# Patient Record
Sex: Male | Born: 1935 | Race: White | Hispanic: No | Smoking: Former smoker
Health system: Southern US, Community
[De-identification: ages and names within clinical notes are randomized; demographics above are authoritative.]

## PROBLEM LIST (undated history)

## (undated) DIAGNOSIS — I2510 Atherosclerotic heart disease of native coronary artery without angina pectoris: Secondary | ICD-10-CM

## (undated) DIAGNOSIS — K219 Gastro-esophageal reflux disease without esophagitis: Secondary | ICD-10-CM

## (undated) DIAGNOSIS — M1990 Unspecified osteoarthritis, unspecified site: Secondary | ICD-10-CM

## (undated) DIAGNOSIS — I10 Essential (primary) hypertension: Secondary | ICD-10-CM

## (undated) DIAGNOSIS — E785 Hyperlipidemia, unspecified: Secondary | ICD-10-CM

## (undated) DIAGNOSIS — M545 Low back pain, unspecified: Secondary | ICD-10-CM

## (undated) DIAGNOSIS — R7302 Impaired glucose tolerance (oral): Secondary | ICD-10-CM

## (undated) DIAGNOSIS — J449 Chronic obstructive pulmonary disease, unspecified: Secondary | ICD-10-CM

## (undated) DIAGNOSIS — Z7901 Long term (current) use of anticoagulants: Secondary | ICD-10-CM

## (undated) DIAGNOSIS — Z952 Presence of prosthetic heart valve: Secondary | ICD-10-CM

## (undated) HISTORY — DX: Chronic obstructive pulmonary disease, unspecified: J44.9

## (undated) HISTORY — DX: Low back pain: M54.5

## (undated) HISTORY — DX: Essential (primary) hypertension: I10

## (undated) HISTORY — DX: Long term (current) use of anticoagulants: Z79.01

## (undated) HISTORY — DX: Atherosclerotic heart disease of native coronary artery without angina pectoris: I25.10

## (undated) HISTORY — DX: Impaired glucose tolerance (oral): R73.02

## (undated) HISTORY — DX: Hyperlipidemia, unspecified: E78.5

## (undated) HISTORY — DX: Unspecified osteoarthritis, unspecified site: M19.90

## (undated) HISTORY — DX: Gastro-esophageal reflux disease without esophagitis: K21.9

## (undated) HISTORY — DX: Low back pain, unspecified: M54.50

## (undated) HISTORY — DX: Presence of prosthetic heart valve: Z95.2

---

## 1998-10-02 ENCOUNTER — Inpatient Hospital Stay (HOSPITAL_COMMUNITY): Admission: EM | Admit: 1998-10-02 | Discharge: 1998-10-15 | Payer: Self-pay | Admitting: Emergency Medicine

## 1998-10-02 ENCOUNTER — Encounter: Payer: Self-pay | Admitting: Emergency Medicine

## 1998-10-05 HISTORY — PX: CARDIAC CATHETERIZATION: SHX172

## 1998-10-07 ENCOUNTER — Encounter: Payer: Self-pay | Admitting: Surgery

## 1998-10-08 ENCOUNTER — Encounter: Payer: Self-pay | Admitting: Surgery

## 1998-10-09 ENCOUNTER — Encounter: Payer: Self-pay | Admitting: Cardiovascular Disease

## 1998-10-09 HISTORY — PX: CORONARY ARTERY BYPASS GRAFT: SHX141

## 1998-10-10 ENCOUNTER — Encounter: Payer: Self-pay | Admitting: Cardiovascular Disease

## 1998-10-11 ENCOUNTER — Encounter: Payer: Self-pay | Admitting: Surgery

## 1998-10-12 ENCOUNTER — Encounter: Payer: Self-pay | Admitting: Cardiothoracic Surgery

## 1999-01-15 HISTORY — PX: AORTIC VALVE REPLACEMENT: SHX41

## 1999-06-10 ENCOUNTER — Encounter: Payer: Self-pay | Admitting: Internal Medicine

## 1999-06-10 ENCOUNTER — Inpatient Hospital Stay (HOSPITAL_COMMUNITY): Admission: EM | Admit: 1999-06-10 | Discharge: 1999-06-12 | Payer: Self-pay | Admitting: Emergency Medicine

## 1999-06-11 HISTORY — PX: CARDIAC CATHETERIZATION: SHX172

## 1999-06-21 ENCOUNTER — Encounter: Payer: Self-pay | Admitting: Emergency Medicine

## 1999-06-21 ENCOUNTER — Inpatient Hospital Stay (HOSPITAL_COMMUNITY): Admission: EM | Admit: 1999-06-21 | Discharge: 1999-06-22 | Payer: Self-pay | Admitting: Emergency Medicine

## 1999-06-22 ENCOUNTER — Ambulatory Visit (HOSPITAL_COMMUNITY): Admission: RE | Admit: 1999-06-22 | Discharge: 1999-06-22 | Payer: Self-pay | Admitting: *Deleted

## 1999-12-13 ENCOUNTER — Encounter: Payer: Self-pay | Admitting: Specialist

## 1999-12-13 ENCOUNTER — Ambulatory Visit (HOSPITAL_COMMUNITY): Admission: RE | Admit: 1999-12-13 | Discharge: 1999-12-13 | Payer: Self-pay | Admitting: Specialist

## 2001-03-13 ENCOUNTER — Encounter: Payer: Self-pay | Admitting: Emergency Medicine

## 2001-03-13 ENCOUNTER — Emergency Department (HOSPITAL_COMMUNITY): Admission: EM | Admit: 2001-03-13 | Discharge: 2001-03-13 | Payer: Self-pay | Admitting: Emergency Medicine

## 2001-08-03 ENCOUNTER — Encounter: Payer: Self-pay | Admitting: Emergency Medicine

## 2001-08-03 ENCOUNTER — Inpatient Hospital Stay (HOSPITAL_COMMUNITY): Admission: EM | Admit: 2001-08-03 | Discharge: 2001-08-05 | Payer: Self-pay | Admitting: Emergency Medicine

## 2001-08-03 ENCOUNTER — Encounter: Payer: Self-pay | Admitting: Internal Medicine

## 2001-08-04 ENCOUNTER — Encounter: Payer: Self-pay | Admitting: Internal Medicine

## 2003-08-30 ENCOUNTER — Encounter: Admission: RE | Admit: 2003-08-30 | Discharge: 2003-08-30 | Payer: Self-pay | Admitting: Internal Medicine

## 2003-10-10 ENCOUNTER — Encounter: Admission: RE | Admit: 2003-10-10 | Discharge: 2003-10-10 | Payer: Self-pay | Admitting: Neurology

## 2004-03-29 ENCOUNTER — Ambulatory Visit: Payer: Self-pay | Admitting: Internal Medicine

## 2004-03-31 ENCOUNTER — Ambulatory Visit: Payer: Self-pay | Admitting: Internal Medicine

## 2004-04-28 ENCOUNTER — Ambulatory Visit: Payer: Self-pay | Admitting: Internal Medicine

## 2004-08-06 ENCOUNTER — Ambulatory Visit: Payer: Self-pay | Admitting: Internal Medicine

## 2005-03-21 ENCOUNTER — Ambulatory Visit: Payer: Self-pay | Admitting: Internal Medicine

## 2007-01-05 ENCOUNTER — Ambulatory Visit: Payer: Self-pay | Admitting: Internal Medicine

## 2007-01-06 ENCOUNTER — Inpatient Hospital Stay (HOSPITAL_COMMUNITY): Admission: EM | Admit: 2007-01-06 | Discharge: 2007-01-09 | Payer: Self-pay | Admitting: Emergency Medicine

## 2007-01-08 HISTORY — PX: CARDIAC CATHETERIZATION: SHX172

## 2007-01-29 ENCOUNTER — Encounter: Payer: Self-pay | Admitting: Internal Medicine

## 2007-01-29 ENCOUNTER — Ambulatory Visit: Payer: Self-pay | Admitting: Internal Medicine

## 2007-01-29 DIAGNOSIS — R05 Cough: Secondary | ICD-10-CM

## 2007-01-29 DIAGNOSIS — R21 Rash and other nonspecific skin eruption: Secondary | ICD-10-CM | POA: Insufficient documentation

## 2007-02-08 ENCOUNTER — Encounter: Payer: Self-pay | Admitting: Internal Medicine

## 2007-02-08 DIAGNOSIS — M1990 Unspecified osteoarthritis, unspecified site: Secondary | ICD-10-CM | POA: Insufficient documentation

## 2007-02-08 DIAGNOSIS — R7309 Other abnormal glucose: Secondary | ICD-10-CM

## 2007-02-08 DIAGNOSIS — M545 Low back pain: Secondary | ICD-10-CM | POA: Insufficient documentation

## 2007-02-08 DIAGNOSIS — J449 Chronic obstructive pulmonary disease, unspecified: Secondary | ICD-10-CM | POA: Insufficient documentation

## 2007-02-08 DIAGNOSIS — I10 Essential (primary) hypertension: Secondary | ICD-10-CM

## 2007-02-08 DIAGNOSIS — K219 Gastro-esophageal reflux disease without esophagitis: Secondary | ICD-10-CM | POA: Insufficient documentation

## 2007-02-08 DIAGNOSIS — I2510 Atherosclerotic heart disease of native coronary artery without angina pectoris: Secondary | ICD-10-CM | POA: Insufficient documentation

## 2007-02-08 DIAGNOSIS — E785 Hyperlipidemia, unspecified: Secondary | ICD-10-CM

## 2007-04-03 ENCOUNTER — Ambulatory Visit: Payer: Self-pay | Admitting: Internal Medicine

## 2007-04-03 DIAGNOSIS — L570 Actinic keratosis: Secondary | ICD-10-CM | POA: Insufficient documentation

## 2007-05-08 ENCOUNTER — Ambulatory Visit: Payer: Self-pay | Admitting: Internal Medicine

## 2007-05-08 DIAGNOSIS — D485 Neoplasm of uncertain behavior of skin: Secondary | ICD-10-CM | POA: Insufficient documentation

## 2007-05-17 DIAGNOSIS — Z952 Presence of prosthetic heart valve: Secondary | ICD-10-CM

## 2007-05-17 HISTORY — DX: Presence of prosthetic heart valve: Z95.2

## 2007-08-01 ENCOUNTER — Ambulatory Visit: Payer: Self-pay | Admitting: Internal Medicine

## 2007-08-05 LAB — CONVERTED CEMR LAB
BUN: 13 mg/dL (ref 6–23)
Basophils Absolute: 0 10*3/uL (ref 0.0–0.1)
Calcium: 9.3 mg/dL (ref 8.4–10.5)
Chloride: 107 meq/L (ref 96–112)
Cholesterol: 205 mg/dL (ref 0–200)
Direct LDL: 136.2 mg/dL
Eosinophils Absolute: 0.2 10*3/uL (ref 0.0–0.6)
GFR calc Af Amer: 83 mL/min
GFR calc non Af Amer: 68 mL/min
HCT: 47.8 % (ref 39.0–52.0)
Hemoglobin: 15.8 g/dL (ref 13.0–17.0)
Lymphocytes Relative: 38.4 % (ref 12.0–46.0)
Monocytes Relative: 8.6 % (ref 3.0–11.0)
Neutro Abs: 3.1 10*3/uL (ref 1.4–7.7)
Platelets: 111 10*3/uL — ABNORMAL LOW (ref 150–400)
Potassium: 4.2 meq/L (ref 3.5–5.1)
RBC: 5.17 M/uL (ref 4.22–5.81)
Sodium: 142 meq/L (ref 135–145)
TSH: 3.47 microintl units/mL (ref 0.35–5.50)
VLDL: 18 mg/dL (ref 0–40)
WBC: 6.4 10*3/uL (ref 4.5–10.5)

## 2007-08-07 ENCOUNTER — Ambulatory Visit: Payer: Self-pay | Admitting: Internal Medicine

## 2007-11-14 ENCOUNTER — Ambulatory Visit: Payer: Self-pay | Admitting: Internal Medicine

## 2007-11-15 LAB — CONVERTED CEMR LAB
CO2: 30 meq/L (ref 19–32)
Calcium: 9.3 mg/dL (ref 8.4–10.5)
Chloride: 108 meq/L (ref 96–112)
GFR calc Af Amer: 75 mL/min
GFR calc non Af Amer: 62 mL/min
Hgb A1c MFr Bld: 5.9 % (ref 4.6–6.0)
Potassium: 4.4 meq/L (ref 3.5–5.1)
Sodium: 143 meq/L (ref 135–145)

## 2007-12-17 ENCOUNTER — Encounter: Payer: Self-pay | Admitting: Internal Medicine

## 2008-03-07 IMAGING — CR DG THORACIC SPINE 2V
3 series · 3 of 3 positions shown · non-contrast
Comparison: none

CLINICAL DATA: Lower thoracic spine pain radiating to right thigh.
 THORACIC SPINE ? 2 VIEW:

[view not recorded (1 of 3)]
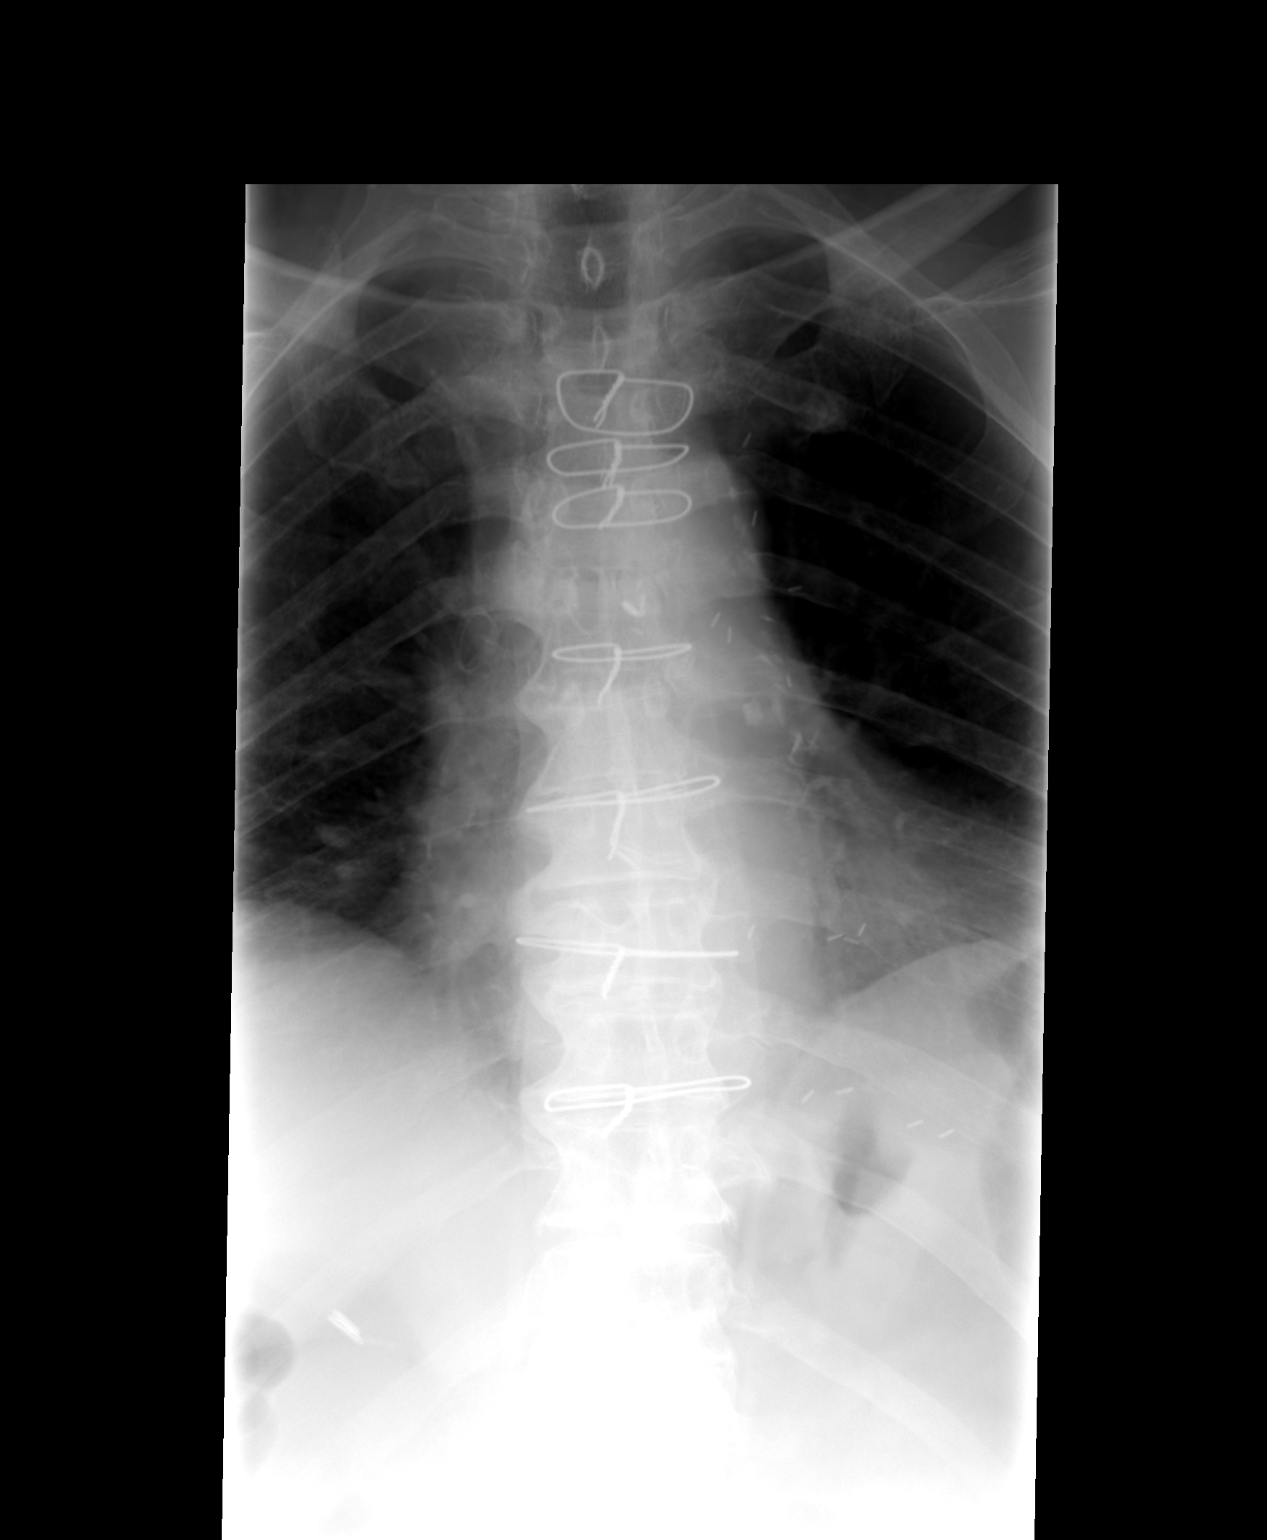

[view not recorded (2 of 3)]
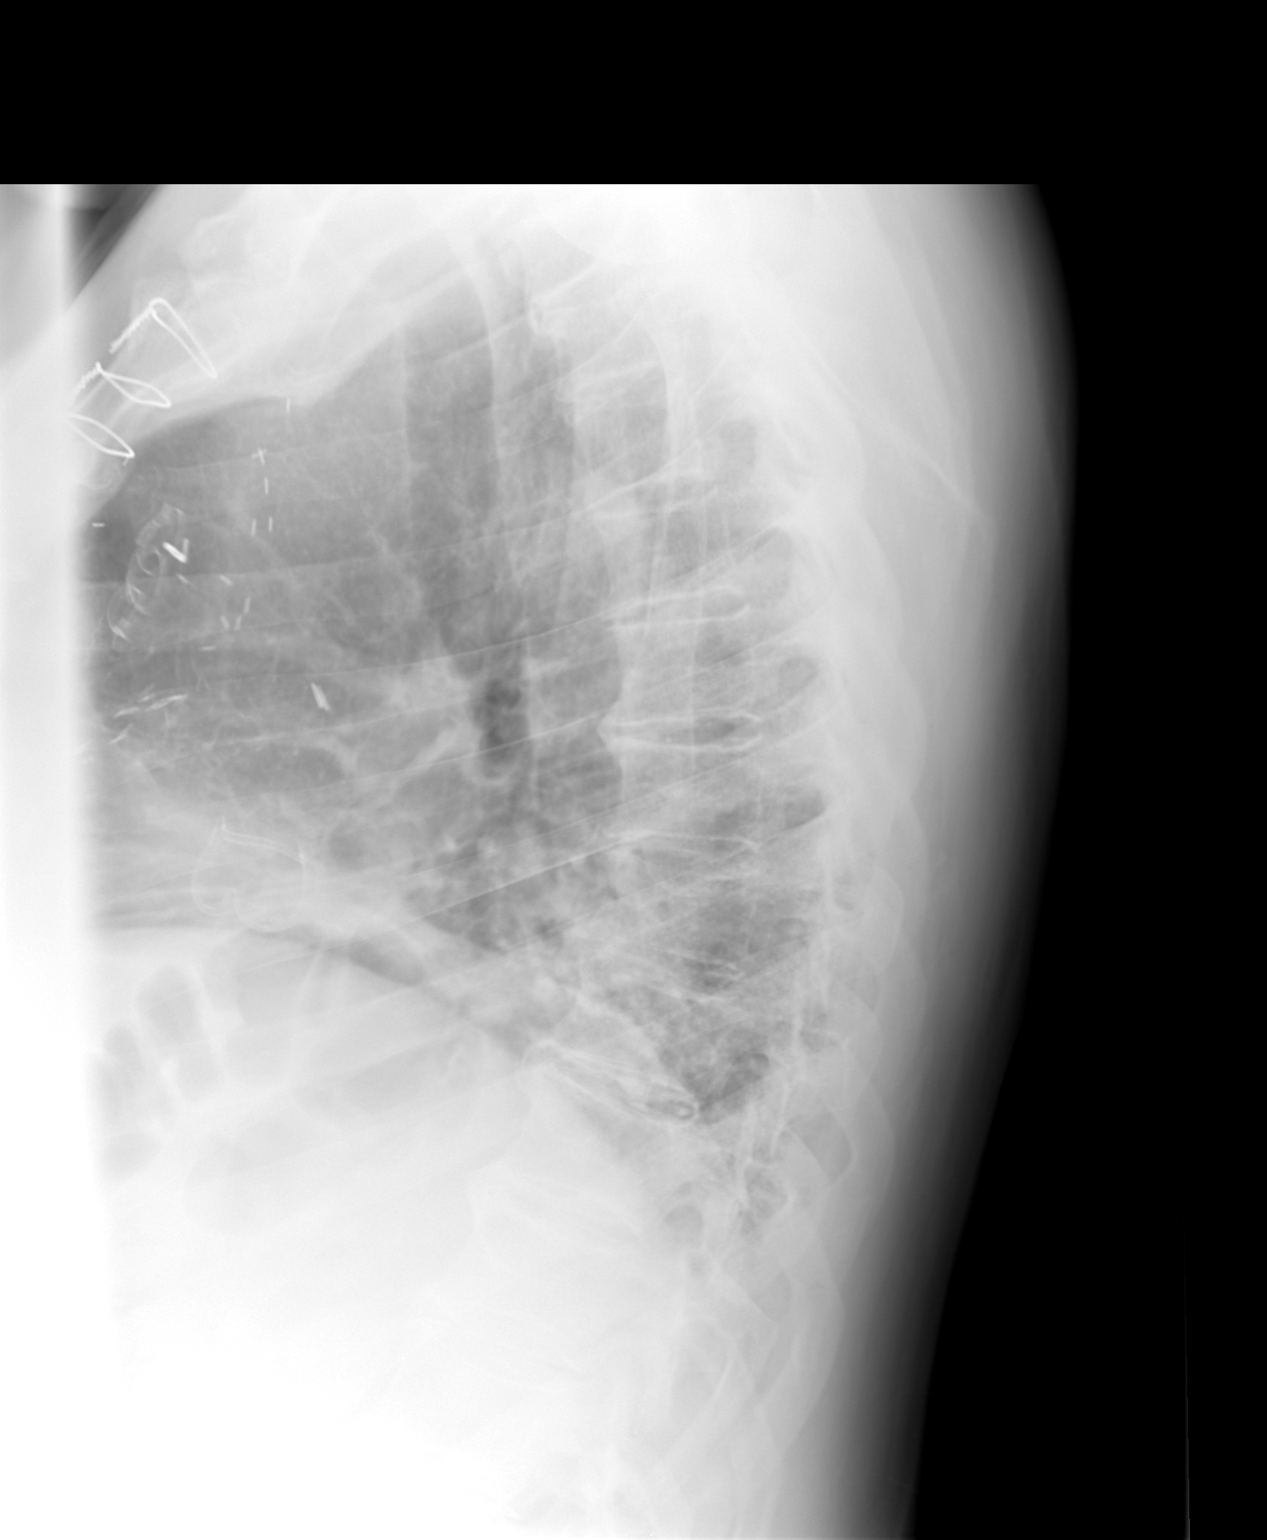

[view not recorded (3 of 3)]
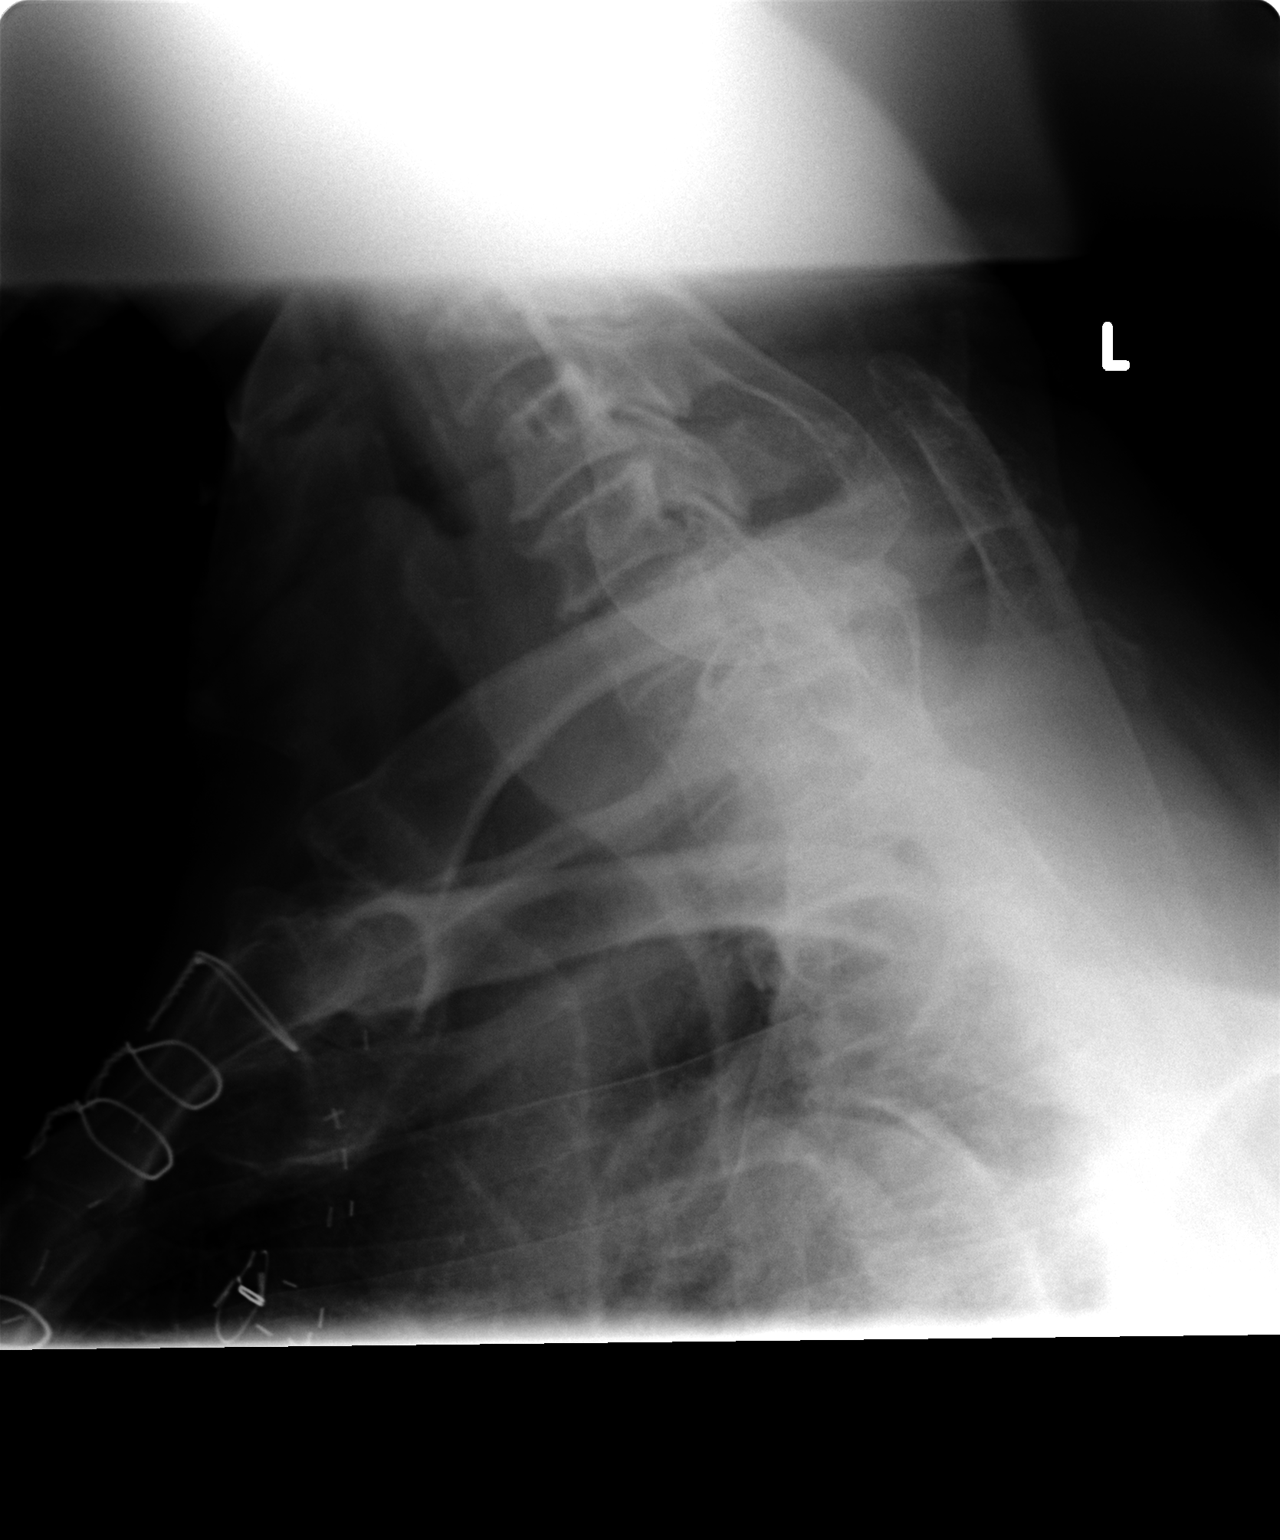

[3 of 3 positions shown; findings below may reference images not displayed]

FINDINGS: Alignment is anatomic.  There are endplate degenerative changes throughout.  Vertebral body height maintained.
IMPRESSION: Spondylosis.

## 2008-11-25 ENCOUNTER — Encounter: Payer: Self-pay | Admitting: Internal Medicine

## 2008-11-25 ENCOUNTER — Ambulatory Visit: Payer: Self-pay | Admitting: Internal Medicine

## 2009-02-11 ENCOUNTER — Ambulatory Visit: Payer: Self-pay | Admitting: Internal Medicine

## 2009-02-13 LAB — CONVERTED CEMR LAB
ALT: 18 units/L (ref 0–53)
AST: 23 units/L (ref 0–37)
Albumin: 4 g/dL (ref 3.5–5.2)
Alkaline Phosphatase: 42 units/L (ref 39–117)
BUN: 17 mg/dL (ref 6–23)
Basophils Relative: 0.4 % (ref 0.0–3.0)
Bilirubin, Direct: 0.2 mg/dL (ref 0.0–0.3)
Calcium: 9.3 mg/dL (ref 8.4–10.5)
Cholesterol: 170 mg/dL (ref 0–200)
Creatinine, Ser: 1.2 mg/dL (ref 0.4–1.5)
Eosinophils Absolute: 0.2 10*3/uL (ref 0.0–0.7)
GFR calc non Af Amer: 61.38 mL/min (ref >60)
Glucose, Bld: 124 mg/dL — ABNORMAL HIGH (ref 70–99)
Hemoglobin, Urine: NEGATIVE
LDL Cholesterol: 106 mg/dL — ABNORMAL HIGH (ref 0–99)
Lymphocytes Relative: 35 % (ref 12.0–46.0)
Lymphs Abs: 2.5 10*3/uL (ref 0.7–4.0)
Neutro Abs: 3.8 10*3/uL (ref 1.4–7.7)
Platelets: 98 10*3/uL — ABNORMAL LOW (ref 150.0–400.0)
RDW: 13 % (ref 11.5–14.6)
Specific Gravity, Urine: >=1.03 (ref 1.000–1.030)
Total CHOL/HDL Ratio: 4
Total Protein: 7.1 g/dL (ref 6.0–8.3)
VLDL: 20.6 mg/dL (ref 0.0–40.0)

## 2009-02-27 ENCOUNTER — Ambulatory Visit: Payer: Self-pay | Admitting: Internal Medicine

## 2009-02-27 DIAGNOSIS — Z87891 Personal history of nicotine dependence: Secondary | ICD-10-CM

## 2009-03-05 ENCOUNTER — Telehealth: Payer: Self-pay | Admitting: Internal Medicine

## 2009-03-05 DIAGNOSIS — C439 Malignant melanoma of skin, unspecified: Secondary | ICD-10-CM | POA: Insufficient documentation

## 2009-03-26 ENCOUNTER — Encounter: Payer: Self-pay | Admitting: Internal Medicine

## 2009-08-21 ENCOUNTER — Ambulatory Visit: Payer: Self-pay | Admitting: Internal Medicine

## 2009-11-04 ENCOUNTER — Telehealth (INDEPENDENT_AMBULATORY_CARE_PROVIDER_SITE_OTHER): Payer: Self-pay | Admitting: *Deleted

## 2009-11-13 ENCOUNTER — Ambulatory Visit: Payer: Self-pay | Admitting: Internal Medicine

## 2009-11-13 LAB — CONVERTED CEMR LAB
ALT: 17 units/L (ref 0–53)
BUN: 20 mg/dL (ref 6–23)
Basophils Absolute: 0 10*3/uL (ref 0.0–0.1)
Bilirubin Urine: NEGATIVE
Bilirubin, Direct: 0.2 mg/dL (ref 0.0–0.3)
Chloride: 109 meq/L (ref 96–112)
Eosinophils Relative: 2.9 % (ref 0.0–5.0)
HDL: 47.3 mg/dL (ref >39.00)
Ketones, ur: NEGATIVE mg/dL
Leukocytes, UA: NEGATIVE
MCV: 92.3 fL (ref 78.0–100.0)
Monocytes Absolute: 0.8 10*3/uL (ref 0.1–1.0)
Neutrophils Relative %: 49.2 % (ref 43.0–77.0)
Nitrite: NEGATIVE
Platelets: 108 10*3/uL — ABNORMAL LOW (ref 150.0–400.0)
RBC: 4.96 M/uL (ref 4.22–5.81)
RDW: 13.9 % (ref 11.5–14.6)
Specific Gravity, Urine: >=1.03 (ref 1.000–1.030)
Total Bilirubin: 0.7 mg/dL (ref 0.3–1.2)
Total Protein, Urine: NEGATIVE mg/dL
Triglycerides: 90 mg/dL (ref 0.0–149.0)
WBC: 7.4 10*3/uL (ref 4.5–10.5)
pH: 5.5 (ref 5.0–8.0)

## 2009-11-20 ENCOUNTER — Telehealth: Payer: Self-pay | Admitting: Internal Medicine

## 2009-11-20 ENCOUNTER — Ambulatory Visit: Payer: Self-pay | Admitting: Internal Medicine

## 2010-01-15 ENCOUNTER — Ambulatory Visit: Payer: Self-pay | Admitting: Internal Medicine

## 2010-01-15 LAB — CONVERTED CEMR LAB
BUN: 17 mg/dL (ref 6–23)
Basophils Relative: 0.3 % (ref 0.0–3.0)
CO2: 27 meq/L (ref 19–32)
Calcium: 8.9 mg/dL (ref 8.4–10.5)
Creatinine, Ser: 1 mg/dL (ref 0.4–1.5)
Eosinophils Relative: 1.5 % (ref 0.0–5.0)
GFR calc non Af Amer: 73.05 mL/min (ref >60)
Glucose, Bld: 92 mg/dL (ref 70–99)
Lymphocytes Relative: 41.4 % (ref 12.0–46.0)
MCHC: 34.6 g/dL (ref 30.0–36.0)
Monocytes Relative: 9.6 % (ref 3.0–12.0)
Neutrophils Relative %: 47.2 % (ref 43.0–77.0)
Platelets: 101 10*3/uL — ABNORMAL LOW (ref 150.0–400.0)
Sodium: 138 meq/L (ref 135–145)

## 2010-01-22 ENCOUNTER — Ambulatory Visit: Payer: Self-pay | Admitting: Internal Medicine

## 2010-01-22 DIAGNOSIS — D696 Thrombocytopenia, unspecified: Secondary | ICD-10-CM | POA: Insufficient documentation

## 2010-04-22 HISTORY — PX: OTHER SURGICAL HISTORY: SHX169

## 2010-04-22 HISTORY — PX: CARDIOVASCULAR STRESS TEST: SHX262

## 2010-05-21 ENCOUNTER — Ambulatory Visit: Admit: 2010-05-21 | Payer: Self-pay | Admitting: Internal Medicine

## 2010-05-28 ENCOUNTER — Ambulatory Visit: Admit: 2010-05-28 | Payer: Self-pay | Admitting: Internal Medicine

## 2010-06-15 NOTE — Assessment & Plan Note (Signed)
Summary: 3 mos f/u    Vital Signs:  Patient profile:   77 year old male Height:      70 inches (177.80 cm) Weight:      217 pounds (98.64 kg) BMI:     31.25 O2 Sat:      95 % on Room air Temp:     97.6 degrees F (36.44 degrees C) oral Pulse rate:   89 / minute Pulse rhythm:   regular Resp:     16 per minute BP sitting:   128 / 76  (left arm) Cuff size:   regular  Vitals Entered By: Lanier Prude, CMA(AAMA) (November 20, 2009 2:05 PM)  O2 Flow:  Room air CC: 3 mo f/u Is Patient Diabetic? No   CC:  3 mo f/u.  History of Present Illness: The patient presents for a follow up of hypertension, diabetes, hyperlipidemia C/o LBP and down L leg  Preventive Screening-Counseling & Management  Alcohol-Tobacco     Smoking Status: quit  Current Medications (verified): 1)  Atenolol 25 Mg Tabs (Atenolol) .Marland Kitchen.. 1 By Mouth Qd 2)  Lisinopril 5 Mg Tabs (Lisinopril) .... 1/2 Qd 3)  Aspir-Low 81 Mg Tbec (Aspirin) .... Qd 4)  Zetia 10 Mg  Tabs (Ezetimibe) .Marland Kitchen.. 1 By Mouth Qd 5)  Triamc. 0.5% Cream in Eucerin Lotion 1:10 .... Use Two Times A Day As Needed  Allergies (verified): 1)  ! Lipitor 2)  ! Welchol (Colesevelam Hcl)  Past History:  Past Medical History: Last updated: 08/07/2007 Anticoagulation therapy Hyperlipidemia Hypertension Osteoarthritis COPD GERD Low back pain AVR 2009 bovine valve Coronary artery disease - intol. of Statins  Social History: Last updated: 04/03/2007 Retired Married  Physical Exam  General:  NAD Mouth:  Oral mucosa and oropharynx without lesions or exudates.  Teeth in good repair. Lungs:  Normal respiratory effort, chest expands symmetrically. Lungs are clear to auscultation, no crackles or wheezes. Heart:  RRR Abdomen:  Bowel sounds positive,abdomen soft and non-tender without masses, organomegaly or hernias noted. Msk:  No deformity or scoliosis noted of thoracic or lumbar spine.   Neurologic:  No cranial nerve deficits noted. Station and  gait are normal. Plantar reflexes are down-going bilaterally. DTRs are symmetrical throughout. Sensory, motor and coordinative functions appear intact. Skin:  scar on ear is healed Psych:  Cognition and judgment appear intact. Alert and cooperative with normal attention span and concentration. No apparent delusions, illusions, hallucinations   Impression & Recommendations:  Problem # 1:  CORONARY ARTERY DISEASE (ICD-414.00) Assessment Unchanged  His updated medication list for this problem includes:    Atenolol 25 Mg Tabs (Atenolol) .Marland Kitchen... 1 by mouth qd    Lisinopril 5 Mg Tabs (Lisinopril) .Marland Kitchen... 1/2 qd    Aspir-low 81 Mg Tbec (Aspirin) ..... Qd  Problem # 2:  HYPERLIPIDEMIA (ICD-272.4) Assessment: Improved  His updated medication list for this problem includes:    Zetia 10 Mg Tabs (Ezetimibe) .Marland Kitchen... 1 by mouth qd  Problem # 3:  LOW BACK PAIN (ICD-724.2) Assessment: Comment Only  His updated medication list for this problem includes:    Aspir-low 81 Mg Tbec (Aspirin) ..... Qd  Problem # 4:  COPD (ICD-496) Assessment: Comment Only  Problem # 5:  OSTEOARTHRITIS (ICD-715.90) Assessment: Comment Only  His updated medication list for this problem includes:    Aspir-low 81 Mg Tbec (Aspirin) ..... Qd  Complete Medication List: 1)  Atenolol 25 Mg Tabs (Atenolol) .Marland Kitchen.. 1 by mouth qd 2)  Lisinopril 5 Mg Tabs (Lisinopril) .Marland KitchenMarland KitchenMarland Kitchen  1/2 qd 3)  Aspir-low 81 Mg Tbec (Aspirin) .... Qd 4)  Zetia 10 Mg Tabs (Ezetimibe) .Marland Kitchen.. 1 by mouth qd 5)  Triamc. 0.5% Cream in Eucerin Lotion 1:10  .... Use two times a day as needed 6)  Prednisone 10 Mg Tabs (Prednisone) .... Take 40mg  qd for 3 days, then 20 mg qd for 3 days, then 10mg  qd for 6 days, then stop. take pc.  Patient Instructions: 1)  Please schedule a follow-up appointment in 2 months. 2)  BMP prior to visit, ICD-9: 3)  CBC w/ Diff prior to visit, ICD-9:995.20 4)  Use stretching and balance exercises that I have provided (15 min. or longer every  day) Prescriptions: PREDNISONE 10 MG TABS (PREDNISONE) Take 40mg  qd for 3 days, then 20 mg qd for 3 days, then 10mg  qd for 6 days, then stop. Take pc.  #24 x 1   Entered and Authorized by:   Tresa Garter MD   Signed by:   Tresa Garter MD on 11/20/2009   Method used:   Print then Give to Patient   RxID:   [id number]

## 2010-06-15 NOTE — Progress Notes (Signed)
Summary: APT TODAY  Phone Note Call from Patient Call back at [phone] ext [phone]   Summary of Call: Pt's daughter called to pass on message to Dr Posey [name]. Pt c/o increase in back pain w/leg tingling.  Also increased stress dealing with wife. Daugther does not think that pt will tell MD and wanted to infom him of her concerns.  Initial call taken by: Lamar Sprinkles, CMA,  November 20, 2009 10:17 AM  Follow-up for Phone Call        Noted - addressed Follow-up by: Tresa Garter MD,  November 20, 2009 5:56 PM

## 2010-06-15 NOTE — Assessment & Plan Note (Signed)
Summary: FU / NWS   Vital Signs:  Patient profile:   77 year old male Height:      70 inches Weight:      216 pounds O2 Sat:      95 % on Room air Temp:     98.5 degrees F oral Pulse rate:   64 / minute BP sitting:   120 / 66  (left arm) Cuff size:   large  Vitals Entered By: Lucious Groves (August 21, 2009 4:16 PM)  O2 Flow:  Room air CC: F/U--Per pt no complaints or symptoms./kb Is Patient Diabetic? No Pain Assessment Patient in pain? no        CC:  F/U--Per pt no complaints or symptoms./kb.  History of Present Illness: The patient presents for a follow up of hypertension, CAD, hyperlipidemia   Current Medications (verified): 1)  Atenolol 25 Mg Tabs (Atenolol) .Marland Kitchen.. 1 By Mouth Qd 2)  Lisinopril 5 Mg Tabs (Lisinopril) .... 1/2 Qd 3)  Aspir-Low 81 Mg Tbec (Aspirin) .... Qd 4)  Zetia 10 Mg  Tabs (Ezetimibe) .Marland Kitchen.. 1 By Mouth Qd 5)  Triamc. 0.5% Cream in Eucerin Lotion 1:10 .... Use Two Times A Day As Needed  Allergies (verified): 1)  ! Lipitor 2)  ! Welchol (Colesevelam Hcl)  Past History:  Past Medical History: Last updated: 08/07/2007 Anticoagulation therapy Hyperlipidemia Hypertension Osteoarthritis COPD GERD Low back pain AVR 2009 bovine valve Coronary artery disease - intol. of Statins  Social History: Last updated: 04/03/2007 Retired Married  Review of Systems  The patient denies fever, vision loss, chest pain, dyspnea on exertion, and abdominal pain.    Physical Exam  General:  NAD Nose:  External nasal examination shows no deformity or inflammation. Nasal mucosa are pink and moist without lesions or exudates. Mouth:  Oral mucosa and oropharynx without lesions or exudates.  Teeth in good repair. Lungs:  Normal respiratory effort, chest expands symmetrically. Lungs are clear to auscultation, no crackles or wheezes. Heart:  RRR Abdomen:  Bowel sounds positive,abdomen soft and non-tender without masses, organomegaly or hernias noted. Msk:  No  deformity or scoliosis noted of thoracic or lumbar spine.   Neurologic:  No cranial nerve deficits noted. Station and gait are normal. Plantar reflexes are down-going bilaterally. DTRs are symmetrical throughout. Sensory, motor and coordinative functions appear intact. Skin:  AKs on face x 5 Darker irreg lesion on L ear Psych:  Cognition and judgment appear intact. Alert and cooperative with normal attention span and concentration. No apparent delusions, illusions, hallucinations   Impression & Recommendations:  Problem # 1:  LOW BACK PAIN (ICD-724.2) Assessment Unchanged  On prescription/OTC  therapy   Problem # 2:  GERD (ICD-530.81) Assessment: Unchanged  Problem # 3:  COPD (ICD-496) Assessment: Unchanged  Problem # 4:  CORONARY ARTERY DISEASE (ICD-414.00) Assessment: Unchanged  His updated medication list for this problem includes:    Atenolol 25 Mg Tabs (Atenolol) .Marland Kitchen... 1 by mouth qd    Lisinopril 5 Mg Tabs (Lisinopril) .Marland Kitchen... 1/2 qd    Aspir-low 81 Mg Tbec (Aspirin) ..... Qd  Problem # 5:  CORONARY ARTERY DISEASE (ICD-414.00) Assessment: Comment Only  His updated medication list for this problem includes:    Atenolol 25 Mg Tabs (Atenolol) .Marland Kitchen... 1 by mouth qd    Lisinopril 5 Mg Tabs (Lisinopril) .Marland Kitchen... 1/2 qd    Aspir-low 81 Mg Tbec (Aspirin) ..... Qd  Problem # 6:  ANTICOAGULATION THERAPY (ICD-V58.61)  Complete Medication List: 1)  Atenolol 25 Mg  Tabs (Atenolol) .Marland Kitchen.. 1 by mouth qd 2)  Lisinopril 5 Mg Tabs (Lisinopril) .... 1/2 qd 3)  Aspir-low 81 Mg Tbec (Aspirin) .... Qd 4)  Zetia 10 Mg Tabs (Ezetimibe) .Marland Kitchen.. 1 by mouth qd 5)  Triamc. 0.5% Cream in Eucerin Lotion 1:10  .... Use two times a day as needed  Patient Instructions: 1)  Please schedule a follow-up appointment in 3 months well w/labs.

## 2010-06-15 NOTE — Assessment & Plan Note (Signed)
Summary: 2 MO ROV /NWS   Vital Signs:  Patient profile:   77 year old male Height:      70 inches Weight:      219 pounds BMI:     31.54 O2 Sat:      96 % on Room air Temp:     97.6 degrees F oral Pulse rate:   74 / minute Pulse rhythm:   regular Resp:     16 per minute BP sitting:   120 / 72  (left arm) Cuff size:   regular  Vitals Entered By: Lanier Prude, CMA(AAMA) (January 22, 2010 1:43 PM)  O2 Flow:  Room air CC: 2 mo f/u Is Patient Diabetic? No   CC:  2 mo f/u.  History of Present Illness: F/u LBP - a lot better. C/o AKs The patient presents for a follow up of hypertension, hyperlipidemia   Current Medications (verified): 1)  Atenolol 25 Mg Tabs (Atenolol) .Marland Kitchen.. 1 By Mouth Qd 2)  Lisinopril 5 Mg Tabs (Lisinopril) .... 1/2 Qd 3)  Aspir-Low 81 Mg Tbec (Aspirin) .... Qd 4)  Zetia 10 Mg  Tabs (Ezetimibe) .Marland Kitchen.. 1 By Mouth Qd 5)  Triamc. 0.5% Cream in Eucerin Lotion 1:10 .... Use Two Times A Day As Needed  Allergies (verified): 1)  ! Lipitor 2)  ! Welchol (Colesevelam Hcl)  Past History:  Past Medical History: Last updated: 08/07/2007 Anticoagulation therapy Hyperlipidemia Hypertension Osteoarthritis COPD GERD Low back pain AVR 2009 bovine valve Coronary artery disease - intol. of Statins  Social History: Last updated: 04/03/2007 Retired Married  Review of Systems  The patient denies fever, dyspnea on exertion, and abdominal pain.    Physical Exam  General:  NAD Mouth:  Oral mucosa and oropharynx without lesions or exudates.  Teeth in good repair. Lungs:  Normal respiratory effort, chest expands symmetrically. Lungs are clear to auscultation, no crackles or wheezes. Heart:  RRR Abdomen:  Bowel sounds positive,abdomen soft and non-tender without masses, organomegaly or hernias noted. Msk:  No deformity or scoliosis noted of thoracic or lumbar spine.  LS NT Neurologic:  No cranial nerve deficits noted. Station and gait are normal. Plantar  reflexes are down-going bilaterally. DTRs are symmetrical throughout. Sensory, motor and coordinative functions appear intact. Skin:  R forearm AK x 3 Psych:  Cognition and judgment appear intact. Alert and cooperative with normal attention span and concentration. No apparent delusions, illusions, hallucinations   Impression & Recommendations:  Problem # 1:  LOW BACK PAIN (ICD-724.2) Assessment Improved  His updated medication list for this problem includes:    Aspir-low 81 Mg Tbec (Aspirin) ..... Qd  Problem # 2:  HYPERTENSION (ICD-401.9) Assessment: Comment Only  His updated medication list for this problem includes:    Atenolol 25 Mg Tabs (Atenolol) .Marland Kitchen... 1 by mouth qd    Lisinopril 5 Mg Tabs (Lisinopril) .Marland Kitchen... 1/2 qd  BP today: 120/72 Prior BP: 128/76 (11/20/2009)  Labs Reviewed: K+: 4.6 (01/15/2010) Creat: : 1.0 (01/15/2010)   Chol: 185 (11/13/2009)   HDL: 47.30 (11/13/2009)   LDL: 120 (11/13/2009)   TG: 90.0 (11/13/2009)  Problem # 3:  GERD (ICD-530.81) Assessment: Comment Only  Problem # 4:  HYPERLIPIDEMIA (ICD-272.4) Assessment: Comment Only  His updated medication list for this problem includes:    Zetia 10 Mg Tabs (Ezetimibe) .Marland Kitchen... 1 by mouth qd  Problem # 5:  THROMBOCYTOPENIA (ICD-287.5) Assessment: Unchanged The labs were reviewed with the patient.   Problem # 6:  ACTINIC KERATOSIS (ICD-702.0)  Procedure: cryo Indication: AK(s) x 3 Risks incl. scar(s), incomplete removal, ect.  and benefits discussed     3  lesion(s) on R prox dorsal forearm was/were treated with liqid N2 in usual fasion.  Tolerated well. Compl. none. Wound care instructions given.   Orders: Cryotherapy/Destruction benign or premalignant lesion (1st lesion)  (17000) Cryotherapy/Destruction benign or premalignant lesion (2nd-14th lesions) (17003)  Complete Medication List: 1)  Atenolol 25 Mg Tabs (Atenolol) .Marland Kitchen.. 1 by mouth qd 2)  Lisinopril 5 Mg Tabs (Lisinopril) .... 1/2 qd 3)   Aspir-low 81 Mg Tbec (Aspirin) .... Qd 4)  Zetia 10 Mg Tabs (Ezetimibe) .Marland Kitchen.. 1 by mouth qd 5)  Triamc. 0.5% Cream in Eucerin Lotion 1:10  .... Use two times a day as needed  Patient Instructions: 1)  Please schedule a follow-up appointment in 4 months. 2)  BMP prior to visit, ICD-9: 3)  CBC w/ Diff prior to visit, ICD-9:995.20    Contraindications/Deferment of Procedures/Staging:    Test/Procedure: FLU VAX    Reason for deferment: patient declined

## 2010-06-15 NOTE — Progress Notes (Signed)
  Phone Note Other Incoming   Request: Send information Summary of Call: Request for records received from [facility]. Request forwarded to [facility].

## 2010-09-28 NOTE — H&P (Signed)
NAME:  David [name], H[place] NO.:  [id number]   MEDICAL RECORD NO.:  [id number]          PATIENT TYPE:  EMS   LOCATION:  [facility]                         FACILITY:  [facility]   PHYSICIAN:  Gordy Savers, MDDATE OF BIRTH:  [birth year]   DATE OF ADMISSION:  01/04/2007  DATE OF DISCHARGE:                              HISTORY & PHYSICAL   CHIEF COMPLAINT:  Chest pain.   HISTORY OF PRESENT ILLNESS:  The patient is a 77 year old gentleman with  a history of coronary artery disease.  He is status post coronary artery  bypass grafting with aortic valve repair in 2000.  He was stable until  about 1 week ago, when he developed upper back discomfort; prior to  this, he was doing some bending and stooping and other unusual  activities required due to some renovations of his home.  For the past 2  days, he has also noted some lower anterior chest wall pain; this seems  be aggravated by movements, especially bending forward.  Associated  symptoms include some intermittent shortness of breath and for the past  2 days, also some left arm discomfort.  Due to his worsening pain, he  presented to the emergency department for evaluation.  EKG revealed no  acute changes.   The patient states that prior to his coronary artery bypass grafting, he  had had a negative stress test 3 months prior.  He is followed by Dr.  Tresa Endo and did have a cardiac evaluation 2-3 months ago; this included a  negative treadmill exercise stress test.  He also had a 2-D  echocardiogram and carotid artery Doppler evaluation.  The patient is  now admitted for further evaluation and treatment of his atypical chest  pain.   PAST MEDICAL HISTORY:  The patient is status post CABG and underwent a  bovine tissue valve in 2000.  In March 2003, he underwent a laparoscopic  cholecystectomy.  He has hypertension and osteoarthritis.  He has had  some lumbar disk disease and has been evaluated by Dr. Otelia Sergeant in the  past.   ALLERGIES:  Include SULFONAMIDES and a STATIN INTOLERANCE.   MEDICAL REGIMEN:  1. Atenolol 25 mg daily.  2. Lisinopril 2.5 mg daily.  3. Aspirin 81 mg daily.  4. Zetia 10 mg daily.   FAMILY HISTORY:  Unchanged.  Please see medical record.   EXAMINATION:  GENERAL:  Examination revealed an overweight, healthy-  appearing male in no acute distress at rest.  VITAL SIGNS:  Blood pressure is 130/80, pulse rate 72, respiratory rate  12.  SKIN:  Warm and dry.  HEAD AND NECK:  Revealed normal pupillary responses, conjunctivae clear,  oropharynx benign.  NECK:  No bruits or adenopathy.  CHEST: Clear.  CARDIOVASCULAR:  Exam reveals a well-healed sternotomy scar.  S1 and S2  normal.  No audible murmur was noted.  He did seem to have some mild  tenderness in the lower anterior chest area.  ABDOMEN:  Obese, soft and nontender.  Bowel sounds were normal.  EXTREMITIES:  Revealed no edema.  Peripheral pulses were intact.   IMPRESSION:  Atypical chest pain, likely chest wall pain.   ADDITIONAL DIAGNOSES:  1. Coronary artery disease, status post coronary artery bypass graft,      status post aortic valve repair.  2. Hypertension.  3. Osteoarthritis.   DISPOSITION:  The patient will be admitted for overnight observation to  a telemetry area.  Serial cardiac enzymes will be reviewed.  He will be  placed on naproxen for its anti-inflammatory effects with Protonix  gastroesophageal protection.      Gordy Savers, MD  Electronically Signed     PFK/MEDQ  D:  01/04/2007  T:  01/06/2007  Job:  [id number]

## 2010-09-28 NOTE — Cardiovascular Report (Signed)
NAMEDARROLL, BREDESON NO.:  [id number]   MEDICAL RECORD NO.:  [id number]          PATIENT TYPE:  INP   LOCATION:  2041                         FACILITY:  [facility]   PHYSICIAN:  Nicki Guadalajara, M.D.     DATE OF BIRTH:  Apr 21, 1924   DATE OF PROCEDURE:  01/08/2007  DATE OF DISCHARGE:                            CARDIAC CATHETERIZATION   INDICATIONS:  [name] is an 77 year old gentleman who has a  history of coronary artery disease and is status post CBG surgery and  Carpentier-Edwards aortic valve replacement for aortic stenosis in March  2000.  At that time, he had a LIMA placed to his LAD, a vein to his  diagonal, a sequential vein to the OM-2, OM-3, and a vein to the PLA  branch of the right coronary artery.  His last catheterization was in  2001 which showed patent grafts.  The patient was recently admitted to  [facility] with some back pain radiating to his chest.  The  patient was seen by Dr. Alanda Amass.  In light of his coronary history,  definitive repeat catheterization was recommended.   PROCEDURE:  After premedication with Valium 5 mg intravenously, the  patient was prepped and draped in the usual fashion.  His right femoral  artery was punctured anteriorly and a 5 French sheath was inserted.  An  FL-5 catheter was necessary for selective angiography in the left  coronary system.  A 5 [place] FR-4 catheter was used for selective  angiography in the right coronary artery.  Because the patient had the  prostatic aortic valve, the aortic valve was not crossed and left  ventriculography was not done.  A pigtail catheter, however, was  inserted for distal aortography.  The patient tolerated procedure well  and returned to his room in satisfactory condition.   Hemodynamic data:  Central aortic pressure was 115/63, mean 84.   Angiographic data:  Left main coronary artery was angiographically  normal and bifurcated into an LAD and left circumflex  system.   The LAD had 80% stenosis after the first diagonal vessel.  There was  diffuse 80% stenosis after the septal perforating artery.  A flush-and-  fill phenomenon was seen distally.   The circumflex vessel gave rise to the first marginal vessel which was  free of significant disease.  There is 95% mid AV groove circumflex  stenosis.  There was occlusion of the OM vessel distally.   The native right coronary was totally occluded in its midsegment.  There  is diffuse 90% stenoses in the anterior RV marginal branch.   The vein graft supplying the PLA was angiographically normal, free of  significant disease, and anastomosed into a PLA.  There was good filling  of the PDA from this vessel.   The sequential vein graft supplying the OM-2 and OM-3 vessel was free of  significant disease and widely patent.  There was no significant disease  in the distal OM-2, OM-3 vessels.   The LIMA graft supplying the LAD was patent.   Distal aortography did not demonstrate any renal artery stenosis.  There  was no significant aneurysm or aortoiliac disease.   IMPRESSION:  1. Native coronary artery disease with 80% proximal and mid left      anterior descending stenoses, 95% circumflex stenoses, and total      occlusion of the native right coronary artery with diffuse disease      in the anterior right ventricular marginal branch.  2. Patent vein graft supplying the PLA of the distal right coronary      artery.  3. Patent sequential vein graft supplying the OM-2 and OM-3 vessel.  4. Patent vein graft supplying the diagonal vessel.  5. Patent left internal mammary artery graft supplying the mid distal      left anterior descending.   RECOMMENDATIONS:  Medical therapy.           ______________________________  Nicki Guadalajara, M.D.     TK/MEDQ  D:  01/08/2007  T:  01/09/2007  Job:  [id number]   cc:   Georgina Quint. Plotnikov, MD

## 2010-09-28 NOTE — Discharge Summary (Signed)
David [name], David [name] NO.:  [id number]   MEDICAL RECORD NO.:  [id number]          PATIENT TYPE:  INP   LOCATION:  2041                         FACILITY:  [facility]   PHYSICIAN:  Nicki Guadalajara, M.D.     DATE OF BIRTH:  [birth year]   DATE OF ADMISSION:  01/04/2007  DATE OF DISCHARGE:  01/09/2007                               DISCHARGE SUMMARY   DISCHARGE DIAGNOSES:  1. Chest pain and back pain, negative myocardial infarction.  2. History of coronary disease with bypass grafting with cath on this      admission.  Grafts are patent.  3. Thrombocytopenia.  Will follow.  4. Nonsustained ventricular tachycardia.  Treat with beta blocker.  5. Hyperlipidemia.  Statin intolerant.  Does tolerate Zetia.  6. Hypertension, controlled.  7. Aortic stenosis.  Status post aortic valve replacement May 2000.   DISCHARGE CONDITION:  Improved.   PROCEDURE:  January 08, 2007, combined left heart cath with graft  visualization.   DISCHARGE MEDICATIONS:  1. Atenolol 25 mg twice a day, which is a new dose for him.  2. Lisinopril 5 mg tablet, half of a 5 mg tablet to equal 2.5 mg      daily.  3. Aspirin 81 mg daily.  4. Zetia 10 mg daily.  5. We added Imdur 30 mg daily.   DISCHARGE INSTRUCTIONS:  1. Increase activity slowly.  2. No lifting for 2 days.  3. No driving for 2 days.  4. Low sodium heart healthy diet.  5. Wash cath site with soap and water.  Call if any bleeding,      swelling, or drainage.  6. Return to the emergency room for any acute episodes.  7. Have blood drawn on Thursday, January 11, 2007, to include platelets      for his thrombocytopenia.   HISTORY OF PRESENT ILLNESS:  An 77 year old white male with history of  coronary disease and bypass grafting, and a CE AVR in May 2005, who was  admitted by Dr. Felicity Coyer on January 04, 2007 after he presented to the  emergency room with chest pain.  He had been doing some physical work in  the last week or so.  He had pain  across his shoulders which radiated  into his anterior chest.  His symptoms are constant and nonexertional.  He had some mild shortness of breath as well.   PAST MEDICAL HISTORY:  1. Hypertension.  2. Lap choley in 2003.  3. Hyperlipidemia with Statin intolerance.  4. Coronary disease as described.   ALLERGIES:  1. SULFA.  2. STATINS.  3. WELCHOL.   FAMILY HISTORY/SOCIAL HISTORY/REVIEW OF SYSTEMS:  See H and P.   PHYSICAL EXAMINATION AT DISCHARGE:  VITAL SIGNS:  Blood pressure 111/58,  pulse 60, respirations 18, temperature 98.6, oxygen saturation 97%.  LAB VALUES:  Hemoglobin 14.7, hematocrit 42.3, WBC 5.8, and platelets  114,000 on admission.  At discharge, hemoglobin 13.3, hematocrit 38.7,  WBC 5.6, platelets 94,000.  Chemistry on admission:  Sodium 142,  potassium 4.1, chloride 108, CO2 28, BUN 19, creatinine 1.06, and  glucose 104.  This remained stable.  Coags:  PT 13.4, INR 1, PTT 29,  stable.  Cardiac enzymes were negative.  CK is 120/105/134.  MB is  2/2.3/2.8.  Troponin I 0.01/0.02/0.01.  Cholesterol 187, LDL 136, HDL  35, triglycerides 82.  Calcium 8.1.   Chest x-ray:  Low lung volume, limited study, and difficult to evaluate  the left lung base, but no acute abnormalities.   The patient was admitted by primary care and originally, plans were for  outpatient Cardiolite, but they did ask for a cardiology consult and the  patient was seen by Dr. Alanda Amass, and felt the patient should undergo  cardiac catheterization, so plans were made for that.   The patient was transferred to Dr. Landry Dyke service and stayed stable  over the weekend.  We did not put him on heparin or Lovenox due to his  thrombocytopenia.  Nonsteroidals have been discontinued as well.  Cardiac cath was done by Dr. Tresa Endo.  Please see dictated report, but  initially he had patent grafts.  He did have some nonsustained  ventricular tachycardia, like 5 beats a day, prior to discharge none for  24 hours.   We increased his atenolol.  He will follow up with Dr.  Posey [name], as well as Dr. Tresa Endo.  If his platelets do not come back up,  he may need a hematology consult as well.      Darcella Gasman. Annie Paras, N.P.    ______________________________  Nicki Guadalajara, M.D.    LRI/MEDQ  D:  01/09/2007  T:  01/09/2007  Job:  [id number]   cc:   Georgina Quint. Plotnikov, MD

## 2010-10-01 NOTE — Cardiovascular Report (Signed)
[place]. [facility]  Patient:    David [name]                        MRN: [id number] Proc. Date: 06/11/99 Adm. Date:  [id number] Disc. Date: [id number] Attending:  Tresa Garter CC:         CP Lab             Sonda Primes, M.D. [facility]             Lennette Bihari, M.D.                        Cardiac Catheterization  PROCEDURE:  Retrograde central aortic catheterization, selective coronary angiography via Judkins technique, saphenous vein graft angiography, ______ and  selective LIMA, LV angiogram in the RAO and LAO projections, aortic root angiogram, midstream PA projection, intracoronary nitroglycerin administration, aortic valve gradient measurement on pull back.  DESCRIPTION OF PROCEDURE:  The above procedure was done using 6-French 4 cm taper preformed coronary Cordis pigtail and coronary catheters, and a Sci-Med 6-French preformed LIMA catheter, and a multi-purpose USCI catheter.  Omnipaque dye was sed throughout the procedure.  Catheterization was done through a 6-French short Terumo side-arm sheath that was placed at the RFA with a single anterior puncture using an 18 thin wall needle under 1% Xylocaine anesthesia and 2 mg of Nubain.  The patient was premedicated with 5 mg of Valium p.o.  Native coronary angiography was done  followed by saphenous vein graft angiography of the right coronary catheter followed by selective LIMA with the 6-French IMA catheter with the ____ mcg given into the IMA.  Repeat injections obtained.  Aortic root injection was done at 25 cc and 18 cc per second, in the LAO projection through the pigtail catheter.  The prosthetic aortic valve was crossed very carefully with a multipurpose catheter  without difficulty, and hand injections of the LV were done in the RAO and LAO projection.  Pull back pressures to the CA with a high speed recording was performed.  Abdominal aortic angiogram was done in the  midstream PA projection t 25 cc and 20 cc per second.  Digital cineangiograms were reviewed along with pressures.  Catheters removed and side-arm sheath was flushed.  Heparin was held on call to the lab.  The ACT was  220. The patient was brought to the holding area for follow up ACT, and will have sheath removal and pressure hemostasis when his ACT normalizes.  He tolerated the procedure well with no chest pain or arrhythmia, and remained n sinus rhythm.  PRESSURES:  LV 160/0, LVEDP 14-16 mmHg, CA 139/82 mmHg.  There was a 23 mmHg mean gradient across the prosthetic aorta, _____ 25 mm valve on catheter pull back.  LV angiogram in the RAO and LAO projection by hand injection showed angiographic LVH with vigorous contraction.  No definite wall motion abnormality.  Estimated EF was approximately 60% with no mitral regurgitation seen.  The aortic root injection in the LAO projection showed mild dilatation of the aortic root, a well-seated aortic valve ring, and no aortic regurgitation.  The  aortic arch showed no dissection or aneurysm formation.  Abdominal aortic angiogram in the PA projection showed a normal single renal arteries bilaterally, essentially normal infrarenal abdominal aorta.  He had normal proximal common iliacs and external iliacs bilaterally with hypogastrics intact  bilaterally with good runoff.  Fluoroscopy showed 3-4+ calcification  of the native coronary circulation.  The main left coronary was large and normal.  The LAD had 50% proximal stenosis concentrically and tandem 75-85% lesions at the proximal third and midportion, proximal to the IMA insertion.  The distal LAD before the apical bifurcation had tandem 95% to 90% lesions.  The first diagonal had a 90% proximal lesion.  The second diagonal was totally occluded.  The circumflex showed a first marginal with 70% narrowing proximally, but good low and a small vessel.  The second marginal had a  95% lesion with competitive flow  from the graft and a third marginal also had competitive flow from the graft, just after the PABG branch.  The right coronary had diffuse disease from the proximal bend, down to the PDA nd PLA bifurcation of 80-85% concentric atherosclerotic narrowing after the proximal third.  There was also a 95% daylight lesion at the proximal third.  The R-V marginal branch had tandem 90%, 90%, and 95% lesions.  There was antegrade filling of the distal RCA, but it was slow.  Saphenous vein graft to the second diagonal branch with excellent anastomosis and a widely patent graft with no disease and excellent flow to the moderate size second diagonal branch.  Sequential saphenous vein graft to the second and third marginal branches showed an excellent anastomosis to both marginal branches with excellent retrograde filling of the native circumflex and PABG branch which were not jeopardized from the proximal native lesion.  The saphenous vein graft to the PLA showed a size mismatch, but an excellent anastomosis to the PLA branch with good flow to the PLA and retrograde filling f the PDA that was unobstructed.  There was however 40-50% diffuse concentric narrowing of the midportion of the graft, and the junction of the mid and distal third, but there was excellent flow and excellent residual lumen.  The patient is a married father and grandfather who underwent multi-vessel CABG and aortic valve replacement on Oct 09, 1998 with a 25 mm Carpentier-Edwards pericardial valve.  Hyperlipidemia, mild systemic hypertension has been followed by Dr. Tresa Endo.  He has had one month of episodic left-sided chest pain radiating to the left arm similar to what he had preoperatively, and an episode of dizziness with presyncope, but no syncope.  Most of his pain is relatively nonexertional and difficult to evaluate, but similar to what he had preoperatively.  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  I am at some  concern over the distal third of the LIMA which is small in caliber and relatively hypoplastic, but good flow with a good anastomosis to the LAD.  This may be because of competitive flow with the native LAD that had severe disease proximal to the  graft insertion.  The etiology of his chest and his LV function is essentially normal with good contraction and angiographic LVH.  The etiology of his chest pain is not clear.  It may be from small unbypassed vessels such as his first diagonal, marginal, or R-V marginal branch, or it may be noncardiac.  In any event, I would recommend continued medical therapy.  He may require Holter monitor recording for his recent episode of presyncope, but there is no evidence of arrhythmia thus far. The patient will follow up with Dr. Daphene Jaeger.  CATHETERIZATION DIAGNOSES:  1. Arteriosclerotic heart disease - status post coronary artery bypass grafting    x 5 and CE#25 aortic valve  replacement, Oct 09, 1998.  2. Widely patent graft with good myocardium perfusion, antegrade and retrograde     flow.  3. Residual diffuse coronary artery disease as outlined.  4. 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  5. Forty to fifty percent narrowing, mid saphenous vein graft - posterolateral     artery.  6. Hyperlipidemia.  7. Systemic hypertension.  8. Well-preserved left ventricular function.  9. A 23 mm prosthetic aortic valve gradient. 10. Presyncope, etiology unknown. DD:  06/11/99 TD:  06/13/99 Job: [id number]

## 2010-10-01 NOTE — Discharge Summary (Signed)
[place]. [facility]  Patient:    David [name]                        MRN: [id number] Adm. Date:  [id number] Disc. Date: [id number] Attending:  Heber Panguitch Dictator:   Cornell Barman, P.A. CC:         Sonda Primes, M.D. [facility]                           Discharge Summary  DISCHARGE DIAGNOSES: 1. Right low back pain. 2. Right lower quadrant pain.  HISTORY OF PRESENT ILLNESS:  [name] is a 77 year old white male who presents  with about a one-week history of right-sided back pain that radiates to his right lower quadrant.  He describes it as sharp and constant.  It has progressively worsened, without relief from Maalox or Phenergan.  No exacerbating or alleviating factors.  The pain has eased up slightly during the day, allowing him to sleep.  When the patient does intensify, he becomes diaphoretic, nauseated, developing ry heaves.  He does complain of distended abdomen, but has had normal bowel movements. The last bowel movement was on June 20, 1999.  He denies any lower extremity  swelling, any groin swelling.  He denies any shortness of breath, chest pain, or fever or chills.  The patient has had a good appetite.  The patient has not taken any of his medications since the Friday prior to this admission, concerned that his medications could be contributing to the pain.  The patient was last hospitalized January 25 through June 12, 1999.  At that time, he had a catheterization that revealed patent grafts.  The patient was pain-free in the ER after receiving Demerol.  PAST MEDICAL HISTORY: 1. Coronary artery disease with valvular disease, status post CABG with aortic    valve replacement in May 2000, with a Carpentier-Edwards valve.  He had a    cardiac catheterization June 11, 1999, that showed patent grafts. 2. Hyperlipidemia. 3. Hypertension. 4. History of degenerative disk disease.  ADMISSION LABORATORY DATA:  CBC  with differential was normal.  CMET was normal. UA was normal.  Lipase was normal.  CT of the abdomen and pelvis showed no mass or inflammatory process.  There was a question of an iliac DVT.  However, when this was again reviewed with the radiologist, he did not see any evidence of DVT.  He did not see any retroperitoneal hematoma.  EKG was unchanged.  HOSPITAL COURSE: #1 - RIGHT LOW BACK TO RIGHT LOWER QUADRANT PAIN:  The source is not evident. There was a question of a right iliac DVT, although this was a ______ per the radiologist.  We did go ahead and pursue lower extremity venous Dopplers, which  were negative.  Also of note, the patient had an abdominal aortic angiogram during his catheterization June 11, 1999, that was normal, so there was no evidence of arterial ischemia.  Other differential diagnosis includes nephrolithiasis that could have passed a stone earlier in the week.  However, his UA on admission was negative for blood, and he has no history of nephrolithiasis.  We were also concerned about degenerative disk disease and questionable radicular pain. However, the patient had normal straight leg raises and no evidence of radicular pain on exam.  We did ask for GI to see the patient to rule out ischemic colitis. However, there was no indication that  this was a GI source of pain either.  The  patient is currently pain-free, and has not received any further pain medications since his Demerol in the emergency department.  He is felt to be stable for discharge.  We will discharge the patient home, but will arrange for an outpatient MRI of his LS spine, results to be sent to Dr. Posey [name] for further follow-up.  DISCHARGE MEDICATIONS:  The patient has been instructed to resume his home medications, which include: 1. Folic acid 1 mg q.d. 2. Altace 2.5 mg q.d. 3. Prevacid 30 mg q.d. 4. Enteric-coated aspirin 81 mg alternating with 325 mg every other day. 5.  Lipitor 10 mg q.d.  FOLLOW-UP:  With Dr. Posey [name]. DD:  06/22/99 TD:  06/22/99 Job: [id number]

## 2010-10-01 NOTE — Discharge Summary (Signed)
[place]. [facility]  Patient:    David [name], David [name] Visit Number: [id number] MRN: [id number]          Service Type: MED Location: [phone] Attending Physician:  Rogelia Boga Dictated by:   Janora Norlander, N.P. Admit Date:  08/03/2001 Discharge Date: 08/05/2001                             Discharge Summary  ADMISSION DIAGNOSIS:  Right flank pain.  DISCHARGE DIAGNOSIS:  Cholecystitis.  PROCEDURES:  Laparoscopic cholecystectomy on 08/05/01.  Surgeon was Dr. Marla Roe.  FOLLOWUP:  Dr. Carolynne Edouard the week of 08/03/01.  DISCHARGE MEDICATIONS: 1. Resume his home medications. 2. He was given a prescription for Vicodin by Dr. Carolynne Edouard.  HOSPITAL COURSE:  The patient was admitted on 08/03/01, complaining of abdominal pain radiating into his chest and into his back.  Given his significant history for coronary artery disease, rule out myocardial infarction protocol was initiated.  He indeed ruled out for cardiac cause of his distress.  CT of the abdomen revealed no evidence of thoracic aneurysm or dissection, mass, or adenopathy.  Bowel sounds were noted near the neck of the gallbladder.  Also noted was a mildly enlarged prostate and a small left renal cyst.  This was on the abdominal CT of 08/03/01.  Surgery was consulted to assist [place] with further management of this patient.  He was scheduled for surgery on 08/05/01.  Please see Dr. Kathryne Sharper postoperative dictation for further details.  He received one unit of packed red blood cells on 08/04/01. CBC on 08/05/01, white blood cell count 5.5, hemoglobin 12.6, hematocrit 36.8. Glucose was elevated at 168.  Cardiac markers remained negative.  Urinalysis negative.  The patient was discharged by the surgical team on 08/05/01. Dictated by:   Janora Norlander, N.P. Attending Physician:  Rogelia Boga DD:  09/10/01 TD:  09/11/01 Job: [id number]

## 2010-10-01 NOTE — Op Note (Signed)
[place]. [facility]  Patient:    SAAHAS, HIDROGO Visit Number: [id number] MRN: [id number]          Service Type: MED Location: [phone] Attending Physician:  Rogelia Boga Dictated by:   Ollen Gross. Vernell Morgans, M.D. Proc. Date: 08/04/01 Admit Date:  08/03/2001 Discharge Date: 08/05/2001                             Operative Report  PREOPERATIVE DIAGNOSIS:  Cholecystitis.  POSTOPERATIVE DIAGNOSIS:  Cholecystitis.  OPERATION:  Laparoscopic cholecystectomy.  SURGEON:  Ollen Gross. Vernell Morgans, M.D.  ASSISTANT:  Sandria Bales. Ezzard Standing, M.D.  ANESTHESIA:  General endotracheal.  DESCRIPTION OF PROCEDURE:  After informed consent was obtained, the patient was brought to the operating room and placed in a supine position on the operating room table.  After adequate induction of general anesthesia, the patients abdominal was prepped with Betadine and draped in the usual sterile manner.  The area above the umbilicus was infiltrated with 0.25% Marcaine and a small transverse incision was made with a 15 blade knife. This incision was carried down through the subcutaneous tissue using blunt dissection with [employer] retractors and a Kelly clamp until the linea alba was identified. The linea alba was incised with a 15 blade knife and each side was grasped with Kocher clamps and elevated anteriorly.  The preperitoneal space was then probed bluntly with the hemostat until the peritoneum was opened and access was gained of the abdominal cavity.  A finger was inserted through this opening to palpate the anterior abdominal wall and there were no apparent adhesions.  0 Vicryl pursestring stitch was placed in the fascia surrounding this opening.  The side cannula was then placed through this opening and anchored in place with the previous placed Vicryl pursestring.  The abdomen was then insufflated with carbon dioxide without difficulty.  A laparoscope was placed through  the Hasson cannula.  There appeared to be an inflammatory mass in the right upper quadrant.  A small transverse upper midline incision was made after infiltrating this area with 0.25% Marcaine.  A 10 mm port was then placed through this incision bluntly into the abdominal cavity under direct vision.  Sites were then chosen laterally on the right side of the abdomen for placement of 5 mm ports and each of these areas were infiltrated with 0.25% Marcaine and small stab incisions were made with 15 blade knife. Then 5 mm ports were placed bluntly through these incisions into the abdominal cavity under direct vision.  Blunt graspers were then placed through each of the lateral 5 mm ports and a dissector was placed through the upper midline port and gentle dissection was then carried out in this area.  The inflammatory adhesions of the omentum, colon and duodenum were able to be taken down after very tedious blunt dissection until the gallbladder was somewhat freed up and able to be elevated.  The peritoneal reflection overlying the gallbladder neck, cystic duct area was then opened with blunt dissection as well and blunt dissection was carried out in this area until the gallbladder neck, cystic duct junction was readily identified and dissected in a circumferential manner creating a good window.   Once this was accomplished, three clips were placed proximally and one distally on the cystic duct and the duct was divided between the two.  Care was taken to keep the common duct medial to this dissection.  Posterior to this, the cystic artery was identified and again dissected bluntly in a circumferential manner until a good window was created.  Two clips were placed proximally and one distally in the artery and the artery was divided between the two.  Next a laparoscopic hook cautery was used to separate the gallbladder from the liver bed.  Prior to completely detaching the gallbladder from the liver  bed, the liver bed was inspected and several bleeding points required coagulation with the Bovie electrocautery for hemostasis.  The gallbladder was then removed, away from the liver bed with the hook electrocautery.  Prior to removing the laparoscope to the upper midline port an endoscopic bag was placed through the upper midline port and the gallbladder was placed within the bag and the bag was sealed.  The camera was then moved to the upper midline port and a gallbladder grasper was placed through the Hasson cannula and grasped the bag.  The bag with the gallbladder was removed with the Hasson cannula through the supraumbilical incision.  The Hasson cannula was then replaced and the abdomen was then examined again for hemostasis and the liver bed was hemostatic.  The abdomen was then insufflated with copious amounts of saline until the affluent was clear.  The ports were then removed under direct vision and were all hemostatic.  The gas was allowed to escape.  The fascial defect at the supraumbilical site was closed with the previously placed Vicryl pursestring stitch.  The rest of the incisions were closed with interrupted 4-0 Monocryl subcuticular stitches. Benzoin, Steri-Strips and sterile dressings were applied.  The patient tolerated the procedure well.  At the end of the case, all needle, sponge and instrument counts were correct.  The patient was awakened and taken to the recovery room in stable condition. Dictated by:   Ollen Gross. Vernell Morgans, M.D. Attending Physician:  Rogelia Boga DD:  08/08/01 TD:  08/09/01 Job: [id number]

## 2010-10-22 ENCOUNTER — Encounter: Payer: Self-pay | Admitting: Internal Medicine

## 2010-10-22 ENCOUNTER — Ambulatory Visit (INDEPENDENT_AMBULATORY_CARE_PROVIDER_SITE_OTHER): Payer: Medicare Other | Admitting: Internal Medicine

## 2010-10-22 DIAGNOSIS — M545 Low back pain: Secondary | ICD-10-CM

## 2010-10-22 DIAGNOSIS — I2510 Atherosclerotic heart disease of native coronary artery without angina pectoris: Secondary | ICD-10-CM

## 2010-10-22 DIAGNOSIS — L570 Actinic keratosis: Secondary | ICD-10-CM | POA: Insufficient documentation

## 2010-10-22 DIAGNOSIS — R609 Edema, unspecified: Secondary | ICD-10-CM

## 2010-10-22 MED ORDER — FUROSEMIDE 20 MG PO TABS: 20.0000 mg | ORAL_TABLET | Freq: Every day | ORAL | Status: DC

## 2010-10-22 NOTE — Assessment & Plan Note (Signed)
Worse over past few months See Meds

## 2010-10-22 NOTE — Assessment & Plan Note (Signed)
Procedure Note :     Procedure : Cryosurgery   Indication:    Actinic keratosis(es)   Risks including unsuccessful procedure , bleeding, infection, bruising, scar, a need for a repeat  procedure and others were explained to the patient in detail as well as the benefits. Informed consent was obtained verbally.   3  lesion(s)  on    R dorsal hand was/were treated with liquid nitrogen on a Q-tip in a usual fasion . Band-Aid was applied and antibiotic ointment was given for a later use.   Tolerated well. Complications none.   Postprocedure instructions :     Keep the wounds clean. You can wash them with liquid soap and water. Pat dry with gauze or a Kleenex tissue  Before applying antibiotic ointment and a Band-Aid.   You need to report immediately  if  any signs of infection develop.

## 2010-10-22 NOTE — Assessment & Plan Note (Signed)
No angina 

## 2010-10-22 NOTE — Progress Notes (Signed)
  Subjective:    Patient ID: David [name], male    DOB: 22-Aug-1923, 77 y.o.   MRN: [id number]  HPI The patient presents for a follow-up of  chronic hypertension, chronic dyslipidemia, CAD controlled with medicines C/o skin lesions on R hand C/o B leg edema     Review of Systems  Constitutional: Negative for appetite change, fatigue and unexpected weight change.  HENT: Negative for nosebleeds, congestion, sore throat, sneezing, trouble swallowing and neck pain.   Eyes: Negative for itching and visual disturbance.  Respiratory: Negative for cough.   Cardiovascular: Positive for leg swelling. Negative for chest pain and palpitations.  Gastrointestinal: Negative for nausea, diarrhea, blood in stool and abdominal distention.  Genitourinary: Negative for frequency and hematuria.  Musculoskeletal: Negative for back pain, joint swelling and gait problem.  Skin: Positive for rash.  Neurological: Negative for dizziness, tremors, speech difficulty and weakness.  Psychiatric/Behavioral: Negative for sleep disturbance, dysphoric mood and agitation. The patient is not nervous/anxious.    Wt Readings from Last 3 Encounters:  10/22/10 218 lb (98.884 kg)  01/22/10 219 lb (99.338 kg)  11/20/09 217 lb (98.431 kg)       Objective:   Physical Exam  Constitutional: He is oriented to person, place, and time. He appears well-developed.  HENT:  Mouth/Throat: Oropharynx is clear and moist.  Eyes: Conjunctivae are normal. Pupils are equal, round, and reactive to light.  Neck: Normal range of motion. No JVD present. No thyromegaly present.  Cardiovascular: Normal rate, regular rhythm, normal heart sounds and intact distal pulses.  Exam reveals no gallop and no friction rub.   No murmur heard. Pulmonary/Chest: Effort normal and breath sounds normal. No respiratory distress. He has no wheezes. He has no rales. He exhibits no tenderness.  Abdominal: Soft. Bowel sounds are normal. He exhibits no distension  and no mass. There is no tenderness. There is no rebound and no guarding.  Musculoskeletal: Normal range of motion. He exhibits edema (trace B ankles). He exhibits no tenderness.  Lymphadenopathy:    He has no cervical adenopathy.  Neurological: He is alert and oriented to person, place, and time. He has normal reflexes. No cranial nerve deficit. He exhibits normal muscle tone. Coordination normal.  Skin: Skin is warm and dry. Rash (AK x3 on R ulnar hand) noted.  Psychiatric: He has a normal mood and affect. His behavior is normal. Judgment and thought content normal.          Assessment & Plan:

## 2010-10-22 NOTE — Assessment & Plan Note (Signed)
Diuretic Compr hose

## 2010-10-22 NOTE — Patient Instructions (Signed)
   Postprocedure instructions :     Keep the wounds clean. You can wash them with liquid soap and water. Pat dry with gauze or a Kleenex tissue  Before applying antibiotic ointment and a Band-Aid.   You need to report immediately  if  any signs of infection develop.    

## 2010-10-26 ENCOUNTER — Other Ambulatory Visit (INDEPENDENT_AMBULATORY_CARE_PROVIDER_SITE_OTHER): Payer: Medicare Other

## 2010-10-26 DIAGNOSIS — M545 Low back pain: Secondary | ICD-10-CM

## 2010-10-26 DIAGNOSIS — R609 Edema, unspecified: Secondary | ICD-10-CM

## 2010-10-26 DIAGNOSIS — I2510 Atherosclerotic heart disease of native coronary artery without angina pectoris: Secondary | ICD-10-CM

## 2010-10-26 LAB — CBC WITH DIFFERENTIAL/PLATELET
Basophils Relative: 0.1 % (ref 0.0–3.0)
Hemoglobin: 15.3 g/dL (ref 13.0–17.0)
Lymphocytes Relative: 37.5 % (ref 12.0–46.0)
MCHC: 34.2 g/dL (ref 30.0–36.0)
Monocytes Relative: 8.1 % (ref 3.0–12.0)
Neutro Abs: 3.3 10*3/uL (ref 1.4–7.7)
WBC: 6.4 10*3/uL (ref 4.5–10.5)

## 2010-10-26 LAB — LIPID PANEL
Total CHOL/HDL Ratio: 4
Triglycerides: 87 mg/dL (ref 0.0–149.0)

## 2010-10-26 LAB — COMPREHENSIVE METABOLIC PANEL
ALT: 22 U/L (ref 0–53)
AST: 26 U/L (ref 0–37)
Albumin: 4.3 g/dL (ref 3.5–5.2)
BUN: 22 mg/dL (ref 6–23)
Calcium: 9.2 mg/dL (ref 8.4–10.5)
Chloride: 106 mEq/L (ref 96–112)
Creatinine, Ser: 1.3 mg/dL (ref 0.4–1.5)
GFR: 55.74 mL/min — ABNORMAL LOW (ref >60.00)
Total Bilirubin: 0.9 mg/dL (ref 0.3–1.2)

## 2010-10-26 LAB — TSH: TSH: 1.67 u[IU]/mL (ref 0.35–5.50)

## 2010-10-27 ENCOUNTER — Telehealth: Payer: Self-pay | Admitting: Internal Medicine

## 2010-10-27 NOTE — Telephone Encounter (Signed)
[name], please, inform patient that all labs are ok Thx 

## 2010-10-28 NOTE — Telephone Encounter (Signed)
Pt informed

## 2010-11-01 ENCOUNTER — Telehealth: Payer: Self-pay | Admitting: Internal Medicine

## 2010-11-01 NOTE — Telephone Encounter (Signed)
No answer. Will retry later.  

## 2010-11-01 NOTE — Telephone Encounter (Signed)
David [name] , please, inform the patient: labs are OK, except for elev glucose. Cut back on carbs   Please, keep  next office visit appointment.   Thank you !

## 2010-11-02 NOTE — Telephone Encounter (Signed)
No answer

## 2010-11-03 NOTE — Telephone Encounter (Signed)
No answer

## 2010-11-05 NOTE — Telephone Encounter (Signed)
I talked to [name]. Please cancel David [name]'s next app - she is in a [facility] Thx

## 2010-11-05 NOTE — Telephone Encounter (Signed)
Pt advised and requests a call from MD ASAP regarding his wife who has recently been admitted to [facility]. Please call pt [phone]

## 2010-11-08 NOTE — Telephone Encounter (Signed)
Scheduler informed to cx next appt.

## 2011-02-14 ENCOUNTER — Encounter: Payer: Self-pay | Admitting: Internal Medicine

## 2011-02-14 DIAGNOSIS — R7302 Impaired glucose tolerance (oral): Secondary | ICD-10-CM

## 2011-02-14 HISTORY — DX: Impaired glucose tolerance (oral): R73.02

## 2011-02-17 ENCOUNTER — Ambulatory Visit (INDEPENDENT_AMBULATORY_CARE_PROVIDER_SITE_OTHER): Payer: Medicare Other | Admitting: Internal Medicine

## 2011-02-17 ENCOUNTER — Encounter: Payer: Self-pay | Admitting: Internal Medicine

## 2011-02-17 DIAGNOSIS — D485 Neoplasm of uncertain behavior of skin: Secondary | ICD-10-CM

## 2011-02-17 DIAGNOSIS — L570 Actinic keratosis: Secondary | ICD-10-CM

## 2011-02-17 NOTE — Progress Notes (Signed)
Subjective:    Patient ID: David [name], male    DOB: 1923-08-30, 77 y.o.   MRN: [id number]  HPI For skin bx and cryo   Review of Systems     Objective:   Physical Exam   Procedure Note :     Procedure1 :  Skin biopsy   Indication:   Suspicious lesion(s)   Risks including unsuccessful procedure , bleeding, infection, bruising, scar, a need for another complete procedure and others were explained to the patient in detail as well as the benefits. Informed consent was obtained and signed.   The patient was placed in a decubitus position.  Lesion #1 on   R lat dorsal hand  measuring 6x6    mm   Skin over lesion #1  was prepped with Betadine and alcohol  and anesthetized with 1 cc of 2% lidocaine and epinephrine, using a 25-gauge 1 inch needle.  Shave biopsy with a sterile Dermablade was carried out in the usual fashion. It was attempted to biopsy with  1 mm free margins.  Hyfrecator was used to destroy the rest of the lesion potentially left behind and for hemostasis. Band-Aid was applied with antibiotic ointment.    Lesion #2 on   R ulnar forearm 11x5   mm   Skin over lesion #2  was prepped with Betadine and alcohol  and anesthetized with 1 cc of 2% lidocaine and epinephrine, using a 25-gauge 1 inch needle.  Shave biopsy with a sterile Dermablade was carried out in the usual fashion. It was attempted to biopsy with  1 mm free margins.  Hyfrecator was used to destroy the rest of the lesion potentially left behind and for hemostasis. Band-Aid was applied with antibiotic ointment.  Lesion #3 on L neck base   measuring 6x5  mm   Skin over lesion #3  was prepped with Betadine and alcohol  and anesthetized with 1 cc of 2% lidocaine and epinephrine, using a 25-gauge 1 inch needle.  Shave biopsy with a sterile Dermablade was carried out in the usual fashion. It was attempted to biopsy with  1 mm free margins.  Hyfrecator was used to destroy the rest of the lesion potentially left behind  and for hemostasis. Band-Aid was applied with antibiotic ointment.   Tolerated well. Complications none.      Postprocedure instructions :    A Band-Aid should be  changed twice daily. You can take a shower tomorrow.  Keep the wounds clean. You can wash them with liquid soap and water. Pat dry with gauze or a Kleenex tissue  Before applying antibiotic ointment and a Band-Aid.   You need to report immediately  if fever, chills or any signs of infection develop.    The biopsy results should be available in 1 -2 weeks.      Procedure Note :     Procedure 2 : Cryosurgery   Indication:   Actinic keratosis(es)   Risks including unsuccessful procedure , bleeding, infection, bruising, scar, a need for a repeat  procedure and others were explained to the patient in detail as well as the benefits. Informed consent was obtained verbally.   3 lesion(s)  on  L hand, 3 on L forearm and 1 on L arm  was/were treated with liquid nitrogen on a Q-tip in a usual fasion . Band-Aid was applied and antibiotic ointment was given for a later use.   Tolerated well. Complications none.   Postprocedure instructions :  Keep the wounds clean. You can wash them with liquid soap and water. Pat dry with gauze or a Kleenex tissue  Before applying antibiotic ointment and a Band-Aid.   You need to report immediately  if  any signs of infection develop.         Assessment & Plan:

## 2011-02-17 NOTE — Assessment & Plan Note (Signed)
Will bx 

## 2011-02-17 NOTE — Patient Instructions (Signed)
Postprocedure instructions :    A Band-Aid should be  changed twice daily. You can take a shower tomorrow.  Keep the wounds clean. You can wash them with liquid soap and water. Pat dry with gauze or a Kleenex tissue  Before applying antibiotic ointment and a Band-Aid.   You need to report immediately  if fever, chills or any signs of infection develop.    The biopsy results should be available in 1 -2 weeks. 

## 2011-02-17 NOTE — Assessment & Plan Note (Signed)
Will treat w/cryo

## 2011-02-21 ENCOUNTER — Telehealth: Payer: Self-pay | Admitting: Internal Medicine

## 2011-02-21 NOTE — Telephone Encounter (Signed)
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 Keep ROM Thx

## 2011-02-22 NOTE — Telephone Encounter (Signed)
Pt informed

## 2011-02-25 LAB — LIPID PANEL
LDL Cholesterol: 136 — ABNORMAL HIGH
Total CHOL/HDL Ratio: 5.3
Triglycerides: 82
VLDL: 16

## 2011-02-25 LAB — BASIC METABOLIC PANEL
BUN: 13
BUN: 14
BUN: 19
CO2: 24
CO2: 26
CO2: 28
Calcium: 8.8
Calcium: 9.2
Chloride: 108
Chloride: 109
Chloride: 110
Creatinine, Ser: 1.06
Creatinine, Ser: 1.14
GFR calc Af Amer: >60
GFR calc Af Amer: >60
GFR calc non Af Amer: >60
GFR calc non Af Amer: >60
Glucose, Bld: 104 — ABNORMAL HIGH
Glucose, Bld: 89
Potassium: 3.7
Potassium: 3.8
Potassium: 4.1
Sodium: 142
Sodium: 142

## 2011-02-25 LAB — CBC
HCT: 38.7 — ABNORMAL LOW
HCT: 42.3
HCT: 42.3
HCT: 46.3
Hemoglobin: 13.3
Hemoglobin: 14.6
Hemoglobin: 14.7
Hemoglobin: 15.8
MCHC: 34.2
MCHC: 34.3
MCHC: 34.6
MCHC: 34.7
MCV: 90.1
MCV: 90.4
MCV: 91.4
MCV: 91.8
Platelets: 100 — ABNORMAL LOW
Platelets: 106 — ABNORMAL LOW
Platelets: 94 — ABNORMAL LOW
RBC: 4.23
RBC: 4.7
RBC: 5.04
RDW: 13.4
RDW: 13.5
RDW: 13.5
WBC: 5.4
WBC: 5.6
WBC: 5.8

## 2011-02-25 LAB — PROTIME-INR
INR: 1
INR: 1.1
Prothrombin Time: 13.1
Prothrombin Time: 14.1

## 2011-02-25 LAB — TROPONIN I
Troponin I: 0.01
Troponin I: 0.01
Troponin I: 0.02

## 2011-02-25 LAB — I-STAT 8, (EC8 V) (CONVERTED LAB)
BUN: 18
Bicarbonate: 22.6
Glucose, Bld: 110 — ABNORMAL HIGH
HCT: 45
Hemoglobin: 15.3
Operator id: 282201
Sodium: 142
TCO2: 24
pCO2, Ven: 32.5 — ABNORMAL LOW

## 2011-02-25 LAB — POCT CARDIAC MARKERS
CKMB, poc: 1.8
Troponin i, poc: <0.05

## 2011-02-25 LAB — CK TOTAL AND CKMB (NOT AT ARMC)
CK, MB: 2
CK, MB: 2.3
Relative Index: 2.2
Total CK: 134

## 2011-02-25 LAB — APTT
aPTT: 28
aPTT: 29

## 2011-03-09 ENCOUNTER — Other Ambulatory Visit: Payer: Self-pay | Admitting: Internal Medicine

## 2011-04-11 ENCOUNTER — Other Ambulatory Visit: Payer: Self-pay | Admitting: Internal Medicine

## 2011-04-21 ENCOUNTER — Other Ambulatory Visit: Payer: Self-pay | Admitting: *Deleted

## 2011-04-21 MED ORDER — LISINOPRIL 5 MG PO TABS: 5.0000 mg | ORAL_TABLET | Freq: Every day | ORAL | Status: DC

## 2011-05-04 ENCOUNTER — Ambulatory Visit (INDEPENDENT_AMBULATORY_CARE_PROVIDER_SITE_OTHER): Payer: Medicare Other | Admitting: Internal Medicine

## 2011-05-04 ENCOUNTER — Encounter: Payer: Self-pay | Admitting: Internal Medicine

## 2011-05-04 ENCOUNTER — Other Ambulatory Visit (INDEPENDENT_AMBULATORY_CARE_PROVIDER_SITE_OTHER): Payer: Medicare Other

## 2011-05-04 VITALS — BP 140/80 | HR 80 | Temp 97.4°F | Resp 16 | Wt 217.0 lb

## 2011-05-04 DIAGNOSIS — J449 Chronic obstructive pulmonary disease, unspecified: Secondary | ICD-10-CM

## 2011-05-04 DIAGNOSIS — D696 Thrombocytopenia, unspecified: Secondary | ICD-10-CM

## 2011-05-04 DIAGNOSIS — I2510 Atherosclerotic heart disease of native coronary artery without angina pectoris: Secondary | ICD-10-CM

## 2011-05-04 DIAGNOSIS — R7309 Other abnormal glucose: Secondary | ICD-10-CM

## 2011-05-04 DIAGNOSIS — I10 Essential (primary) hypertension: Secondary | ICD-10-CM

## 2011-05-04 DIAGNOSIS — R7302 Impaired glucose tolerance (oral): Secondary | ICD-10-CM

## 2011-05-04 DIAGNOSIS — M545 Low back pain: Secondary | ICD-10-CM

## 2011-05-04 DIAGNOSIS — L570 Actinic keratosis: Secondary | ICD-10-CM

## 2011-05-04 DIAGNOSIS — Z23 Encounter for immunization: Secondary | ICD-10-CM

## 2011-05-04 LAB — CBC WITH DIFFERENTIAL/PLATELET
Basophils Absolute: 0 10*3/uL (ref 0.0–0.1)
Eosinophils Absolute: 0.2 10*3/uL (ref 0.0–0.7)
HCT: 47.4 % (ref 39.0–52.0)
Hemoglobin: 16 g/dL (ref 13.0–17.0)
Lymphs Abs: 2.8 10*3/uL (ref 0.7–4.0)
MCHC: 33.8 g/dL (ref 30.0–36.0)
Neutro Abs: 4.5 10*3/uL (ref 1.4–7.7)
Platelets: 113 10*3/uL — ABNORMAL LOW (ref 150.0–400.0)
RDW: 13.4 % (ref 11.5–14.6)

## 2011-05-04 LAB — BASIC METABOLIC PANEL
BUN: 20 mg/dL (ref 6–23)
Calcium: 9 mg/dL (ref 8.4–10.5)
Creatinine, Ser: 1.3 mg/dL (ref 0.4–1.5)
GFR: 57.71 mL/min — ABNORMAL LOW (ref >60.00)

## 2011-05-04 LAB — TSH: TSH: 1.95 u[IU]/mL (ref 0.35–5.50)

## 2011-05-04 MED ORDER — VITAMIN D 1000 UNITS PO TABS: 1000.0000 [IU] | ORAL_TABLET | Freq: Every day | ORAL | Status: AC

## 2011-05-04 NOTE — Assessment & Plan Note (Signed)
Continue with current prescription therapy as reflected on the Med list.  

## 2011-05-04 NOTE — Assessment & Plan Note (Signed)
Better - on Rx prn

## 2011-05-04 NOTE — Progress Notes (Signed)
  Subjective:    Patient ID: David [name], male    DOB: 1923/11/21, 77 y.o.   MRN: [id number]  HPI  The patient presents for a follow-up of  chronic hypertension, chronic dyslipidemia, CAD controlled with medicines     Review of Systems  Constitutional: Negative for appetite change, fatigue and unexpected weight change.  HENT: Negative for nosebleeds, congestion, sore throat, sneezing, trouble swallowing and neck pain.   Eyes: Negative for itching and visual disturbance.  Respiratory: Negative for cough.   Cardiovascular: Negative for chest pain, palpitations and leg swelling.  Gastrointestinal: Negative for nausea, diarrhea, blood in stool and abdominal distention.  Genitourinary: Negative for frequency and hematuria.  Musculoskeletal: Negative for back pain, joint swelling and gait problem.  Skin: Negative for rash.  Neurological: Negative for dizziness, tremors, speech difficulty and weakness.  Psychiatric/Behavioral: Negative for sleep disturbance, dysphoric mood and agitation. The patient is not nervous/anxious.        Objective:   Physical Exam  Constitutional: He is oriented to person, place, and time. He appears well-developed and well-nourished. No distress.  HENT:  Mouth/Throat: Oropharynx is clear and moist.  Eyes: Conjunctivae are normal. Pupils are equal, round, and reactive to light.  Neck: Normal range of motion. No JVD present. No thyromegaly present.  Cardiovascular: Normal rate, regular rhythm, normal heart sounds and intact distal pulses.  Exam reveals no gallop and no friction rub.   No murmur heard. Pulmonary/Chest: Effort normal and breath sounds normal. No respiratory distress. He has no wheezes. He has no rales. He exhibits no tenderness.  Abdominal: Soft. Bowel sounds are normal. He exhibits no distension and no mass. There is no tenderness. There is no rebound and no guarding.  Musculoskeletal: Normal range of motion. He exhibits no edema and no  tenderness.  Lymphadenopathy:    He has no cervical adenopathy.  Neurological: He is alert and oriented to person, place, and time. He has normal reflexes. No cranial nerve deficit. He exhibits normal muscle tone. Coordination normal.  Skin: Skin is warm and dry. No rash noted.  Psychiatric: He has a normal mood and affect. His behavior is normal. Judgment and thought content normal.          Assessment & Plan:

## 2011-05-04 NOTE — Assessment & Plan Note (Signed)
Continue with current prescription therapy prn  

## 2011-05-04 NOTE — Assessment & Plan Note (Signed)
Watching skin

## 2011-05-04 NOTE — Assessment & Plan Note (Signed)
Monitoring labs 

## 2011-05-05 ENCOUNTER — Telehealth: Payer: Self-pay | Admitting: Internal Medicine

## 2011-05-05 NOTE — Telephone Encounter (Signed)
David [name], please, inform patient that all labs are normal except for low plt, however, they are better Tx

## 2011-05-05 NOTE — Telephone Encounter (Signed)
Pt informed

## 2011-06-13 DIAGNOSIS — IMO0002 Reserved for concepts with insufficient information to code with codable children: Secondary | ICD-10-CM | POA: Diagnosis not present

## 2011-08-31 ENCOUNTER — Ambulatory Visit (INDEPENDENT_AMBULATORY_CARE_PROVIDER_SITE_OTHER): Payer: Medicare Other | Admitting: Internal Medicine

## 2011-08-31 ENCOUNTER — Encounter: Payer: Self-pay | Admitting: Internal Medicine

## 2011-08-31 ENCOUNTER — Other Ambulatory Visit (INDEPENDENT_AMBULATORY_CARE_PROVIDER_SITE_OTHER): Payer: Medicare Other

## 2011-08-31 VITALS — BP 122/68 | HR 72 | Temp 97.4°F | Resp 16 | Wt 219.0 lb

## 2011-08-31 DIAGNOSIS — I2510 Atherosclerotic heart disease of native coronary artery without angina pectoris: Secondary | ICD-10-CM

## 2011-08-31 DIAGNOSIS — R7309 Other abnormal glucose: Secondary | ICD-10-CM | POA: Diagnosis not present

## 2011-08-31 DIAGNOSIS — I10 Essential (primary) hypertension: Secondary | ICD-10-CM

## 2011-08-31 DIAGNOSIS — J449 Chronic obstructive pulmonary disease, unspecified: Secondary | ICD-10-CM

## 2011-08-31 DIAGNOSIS — Z87891 Personal history of nicotine dependence: Secondary | ICD-10-CM

## 2011-08-31 DIAGNOSIS — M545 Low back pain: Secondary | ICD-10-CM

## 2011-08-31 DIAGNOSIS — R7302 Impaired glucose tolerance (oral): Secondary | ICD-10-CM

## 2011-08-31 DIAGNOSIS — E785 Hyperlipidemia, unspecified: Secondary | ICD-10-CM

## 2011-08-31 LAB — BASIC METABOLIC PANEL
CO2: 27 mEq/L (ref 19–32)
Calcium: 9.4 mg/dL (ref 8.4–10.5)
Creatinine, Ser: 1.3 mg/dL (ref 0.4–1.5)
Sodium: 141 mEq/L (ref 135–145)

## 2011-08-31 LAB — LIPID PANEL
Cholesterol: 199 mg/dL (ref 0–200)
HDL: 44.2 mg/dL (ref >39.00)
LDL Cholesterol: 136 mg/dL — ABNORMAL HIGH (ref 0–99)
Total CHOL/HDL Ratio: 5
Triglycerides: 93 mg/dL (ref 0.0–149.0)

## 2011-08-31 LAB — LIPASE: Lipase: 37 U/L (ref 11.0–59.0)

## 2011-08-31 LAB — HEMOGLOBIN A1C: Hgb A1c MFr Bld: 5.9 % (ref 4.6–6.5)

## 2011-08-31 LAB — HEPATIC FUNCTION PANEL
AST: 19 U/L (ref 0–37)
Albumin: 4.2 g/dL (ref 3.5–5.2)
Total Protein: 7.1 g/dL (ref 6.0–8.3)

## 2011-08-31 MED ORDER — EZETIMIBE 10 MG PO TABS: 10.0000 mg | ORAL_TABLET | Freq: Every day | ORAL | Status: DC

## 2011-08-31 NOTE — Assessment & Plan Note (Signed)
Watching glu

## 2011-08-31 NOTE — Assessment & Plan Note (Signed)
Continue with current prescription therapy as reflected on the Med list.  

## 2011-08-31 NOTE — Assessment & Plan Note (Signed)
Cont w/Zetia

## 2011-08-31 NOTE — Progress Notes (Signed)
Patient ID: David [name], male   DOB: 1924/02/27, 78 y.o.   MRN: [id number]  Subjective:    Patient ID: David [name], male    DOB: Aug 19, 1923, 78 y.o.   MRN: [id number]  HPI  The patient presents for a follow-up of  chronic hypertension, chronic dyslipidemia, CAD controlled with medicines  Wt Readings from Last 3 Encounters:  08/31/11 219 lb (99.338 kg)  05/04/11 217 lb (98.431 kg)  02/17/11 217 lb (98.431 kg)    BP Readings from Last 3 Encounters:  08/31/11 122/68  05/04/11 140/80  02/17/11 118/70     Review of Systems  Constitutional: Negative for appetite change, fatigue and unexpected weight change.  HENT: Negative for nosebleeds, congestion, sore throat, sneezing, trouble swallowing and neck pain.   Eyes: Negative for itching and visual disturbance.  Respiratory: Negative for cough.   Cardiovascular: Negative for chest pain, palpitations and leg swelling.  Gastrointestinal: Negative for nausea, diarrhea, blood in stool and abdominal distention.  Genitourinary: Negative for frequency and hematuria.  Musculoskeletal: Negative for back pain, joint swelling and gait problem.  Skin: Negative for rash.  Neurological: Negative for dizziness, tremors, speech difficulty and weakness.  Psychiatric/Behavioral: Negative for suicidal ideas, sleep disturbance, dysphoric mood and agitation. The patient is not nervous/anxious.        Objective:   Physical Exam  Constitutional: He is oriented to person, place, and time. He appears well-developed and well-nourished. No distress.  HENT:  Mouth/Throat: Oropharynx is clear and moist.  Eyes: Conjunctivae are normal. Pupils are equal, round, and reactive to light.  Neck: Normal range of motion. No JVD present. No thyromegaly present.  Cardiovascular: Normal rate, regular rhythm, normal heart sounds and intact distal pulses.  Exam reveals no gallop and no friction rub.   No murmur heard. Pulmonary/Chest: Effort normal and breath sounds  normal. No respiratory distress. He has no wheezes. He has no rales. He exhibits no tenderness.  Abdominal: Soft. Bowel sounds are normal. He exhibits no distension and no mass. There is no tenderness. There is no rebound and no guarding.  Musculoskeletal: Normal range of motion. He exhibits no edema and no tenderness.  Lymphadenopathy:    He has no cervical adenopathy.  Neurological: He is alert and oriented to person, place, and time. He has normal reflexes. No cranial nerve deficit. He exhibits normal muscle tone. Coordination normal.  Skin: Skin is warm and dry. No rash noted.  Psychiatric: He has a normal mood and affect. His behavior is normal. Judgment and thought content normal.   Lab Results  Component Value Date   WBC 8.1 05/04/2011   HGB 16.0 05/04/2011   HCT 47.4 05/04/2011   PLT 113.0* 05/04/2011   GLUCOSE 100* 05/04/2011   CHOL 189 10/26/2010   TRIG 87.0 10/26/2010   HDL 48.90 10/26/2010   LDLDIRECT 136.2 08/01/2007   LDLCALC 123* 10/26/2010   ALT 22 10/26/2010   AST 26 10/26/2010   NA 143 05/04/2011   K 4.5 05/04/2011   CL 108 05/04/2011   CREATININE 1.3 05/04/2011   BUN 20 05/04/2011   CO2 28 05/04/2011   TSH 1.95 05/04/2011   PSA 2.04 11/13/2009   INR 1.0 01/08/2007   HGBA1C 6.0 05/04/2011          Assessment & Plan:

## 2011-09-02 ENCOUNTER — Telehealth: Payer: Self-pay | Admitting: *Deleted

## 2011-09-02 NOTE — Telephone Encounter (Signed)
Left detailed mess informing pt of below.  

## 2011-09-02 NOTE — Telephone Encounter (Signed)
Message copied by Merrilyn Puma on Fri Sep 02, 2011  2:41 PM ------      Message from: Janeal Holmes      Created: Thu Sep 01, 2011  7:35 AM       Misty Stanley, please, inform patient that all labs are OK      Thank you!

## 2011-10-04 DIAGNOSIS — E782 Mixed hyperlipidemia: Secondary | ICD-10-CM | POA: Diagnosis not present

## 2011-10-04 DIAGNOSIS — I2510 Atherosclerotic heart disease of native coronary artery without angina pectoris: Secondary | ICD-10-CM | POA: Diagnosis not present

## 2011-10-04 DIAGNOSIS — I10 Essential (primary) hypertension: Secondary | ICD-10-CM | POA: Diagnosis not present

## 2011-10-12 ENCOUNTER — Other Ambulatory Visit: Payer: Self-pay | Admitting: Internal Medicine

## 2011-12-01 ENCOUNTER — Encounter: Payer: Self-pay | Admitting: Internal Medicine

## 2011-12-01 ENCOUNTER — Ambulatory Visit (INDEPENDENT_AMBULATORY_CARE_PROVIDER_SITE_OTHER): Payer: Medicare Other | Admitting: Internal Medicine

## 2011-12-01 VITALS — BP 128/60 | HR 80 | Temp 97.5°F | Resp 16 | Wt 217.0 lb

## 2011-12-01 DIAGNOSIS — L570 Actinic keratosis: Secondary | ICD-10-CM

## 2011-12-01 DIAGNOSIS — M545 Low back pain: Secondary | ICD-10-CM

## 2011-12-01 DIAGNOSIS — I2510 Atherosclerotic heart disease of native coronary artery without angina pectoris: Secondary | ICD-10-CM

## 2011-12-01 DIAGNOSIS — E785 Hyperlipidemia, unspecified: Secondary | ICD-10-CM

## 2011-12-01 NOTE — Patient Instructions (Addendum)
   Postprocedure instructions :     Keep the wounds clean. You can wash them with liquid soap and water. Pat dry with gauze or a Kleenex tissue  Before applying antibiotic ointment and a Band-Aid.   You need to report immediately  if  any signs of infection develop.    

## 2011-12-01 NOTE — Assessment & Plan Note (Signed)
Continue with current prescription therapy as reflected on the Med list.  

## 2011-12-01 NOTE — Assessment & Plan Note (Signed)
On Zetia 

## 2011-12-01 NOTE — Assessment & Plan Note (Signed)
7/13 face Cryo

## 2011-12-01 NOTE — Progress Notes (Signed)
Subjective:    Patient ID: David [name], male    DOB: April 29, 1924, 78 y.o.   MRN: [id number]  HPI  The patient presents for a follow-up of  chronic hypertension, chronic dyslipidemia, CAD controlled with medicines. He bought himself a motor home... C/o face skin lesions - new ones  Wt Readings from Last 3 Encounters:  12/01/11 217 lb (98.431 kg)  08/31/11 219 lb (99.338 kg)  05/04/11 217 lb (98.431 kg)    BP Readings from Last 3 Encounters:  12/01/11 128/60  08/31/11 122/68  05/04/11 140/80     Review of Systems  Constitutional: Negative for appetite change, fatigue and unexpected weight change.  HENT: Negative for nosebleeds, congestion, sore throat, sneezing, trouble swallowing and neck pain.   Eyes: Negative for itching and visual disturbance.  Respiratory: Negative for cough.   Cardiovascular: Negative for chest pain, palpitations and leg swelling.  Gastrointestinal: Negative for nausea, diarrhea, blood in stool and abdominal distention.  Genitourinary: Negative for frequency and hematuria.  Musculoskeletal: Negative for back pain, joint swelling and gait problem.  Skin: Negative for rash.  Neurological: Negative for dizziness, tremors, speech difficulty and weakness.  Psychiatric/Behavioral: Negative for suicidal ideas, disturbed wake/sleep cycle, dysphoric mood and agitation. The patient is not nervous/anxious.        Objective:   Physical Exam  Constitutional: He is oriented to person, place, and time. He appears well-developed and well-nourished. No distress.  HENT:  Mouth/Throat: Oropharynx is clear and moist.  Eyes: Conjunctivae are normal. Pupils are equal, round, and reactive to light.  Neck: Normal range of motion. No JVD present. No thyromegaly present.  Cardiovascular: Normal rate, regular rhythm, normal heart sounds and intact distal pulses.  Exam reveals no gallop and no friction rub.   No murmur heard. Pulmonary/Chest: Effort normal and breath sounds  normal. No respiratory distress. He has no wheezes. He has no rales. He exhibits no tenderness.  Abdominal: Soft. Bowel sounds are normal. He exhibits no distension and no mass. There is no tenderness. There is no rebound and no guarding.  Musculoskeletal: Normal range of motion. He exhibits no edema and no tenderness.  Lymphadenopathy:    He has no cervical adenopathy.  Neurological: He is alert and oriented to person, place, and time. He has normal reflexes. No cranial nerve deficit. He exhibits normal muscle tone. Coordination normal.  Skin: Skin is warm and dry. No rash noted.  Psychiatric: He has a normal mood and affect. His behavior is normal. Judgment and thought content normal.  AKs nose, cheeks, R ear Lab Results  Component Value Date   WBC 8.1 05/04/2011   HGB 16.0 05/04/2011   HCT 47.4 05/04/2011   PLT 113.0* 05/04/2011   GLUCOSE 126* 08/31/2011   CHOL 199 08/31/2011   TRIG 93.0 08/31/2011   HDL 44.20 08/31/2011   LDLDIRECT 136.2 08/01/2007   LDLCALC 136* 08/31/2011   ALT 15 08/31/2011   AST 19 08/31/2011   NA 141 08/31/2011   K 4.4 08/31/2011   CL 107 08/31/2011   CREATININE 1.3 08/31/2011   BUN 18 08/31/2011   CO2 27 08/31/2011   TSH 1.95 05/04/2011   PSA 2.04 11/13/2009   INR 1.0 01/08/2007   HGBA1C 5.9 08/31/2011      Procedure Note :     Procedure : Cryosurgery   Indication:   Actinic keratosis(es)   Risks including unsuccessful procedure , bleeding, infection, bruising, scar, a need for a repeat  procedure and others were explained to  the patient in detail as well as the benefits. Informed consent was obtained verbally.    6 lesion(s)  on nose, B cheeks, R ear   was/were treated with liquid nitrogen on a Q-tip in a usual fasion . Band-Aid was applied and antibiotic ointment was given for a later use.   Tolerated well. Complications none.   Postprocedure instructions :     Keep the wounds clean. You can wash them with liquid soap and water. Pat dry with gauze or a  Kleenex tissue  Before applying antibiotic ointment and a Band-Aid.   You need to report immediately  if  any signs of infection develop.        Assessment & Plan:

## 2012-02-28 ENCOUNTER — Other Ambulatory Visit (INDEPENDENT_AMBULATORY_CARE_PROVIDER_SITE_OTHER): Payer: Medicare Other

## 2012-02-28 DIAGNOSIS — L570 Actinic keratosis: Secondary | ICD-10-CM

## 2012-02-28 DIAGNOSIS — I2510 Atherosclerotic heart disease of native coronary artery without angina pectoris: Secondary | ICD-10-CM | POA: Diagnosis not present

## 2012-02-28 LAB — HEPATIC FUNCTION PANEL
ALT: 16 U/L (ref 0–53)
AST: 21 U/L (ref 0–37)
Albumin: 3.9 g/dL (ref 3.5–5.2)
Alkaline Phosphatase: 42 U/L (ref 39–117)
Bilirubin, Direct: 0.2 mg/dL (ref 0.0–0.3)
Total Protein: 7.4 g/dL (ref 6.0–8.3)

## 2012-02-28 LAB — BASIC METABOLIC PANEL
BUN: 13 mg/dL (ref 6–23)
CO2: 28 mEq/L (ref 19–32)
Chloride: 106 mEq/L (ref 96–112)
Creatinine, Ser: 1.2 mg/dL (ref 0.4–1.5)
Glucose, Bld: 101 mg/dL — ABNORMAL HIGH (ref 70–99)

## 2012-02-28 LAB — LIPID PANEL
Cholesterol: 185 mg/dL (ref 0–200)
LDL Cholesterol: 116 mg/dL — ABNORMAL HIGH (ref 0–99)
Triglycerides: 99 mg/dL (ref 0.0–149.0)

## 2012-03-09 ENCOUNTER — Encounter: Payer: Self-pay | Admitting: Internal Medicine

## 2012-03-09 ENCOUNTER — Ambulatory Visit (INDEPENDENT_AMBULATORY_CARE_PROVIDER_SITE_OTHER): Payer: Medicare Other | Admitting: Internal Medicine

## 2012-03-09 VITALS — BP 150/82 | HR 80 | Temp 97.5°F | Resp 16 | Wt 216.0 lb

## 2012-03-09 DIAGNOSIS — K219 Gastro-esophageal reflux disease without esophagitis: Secondary | ICD-10-CM

## 2012-03-09 DIAGNOSIS — C439 Malignant melanoma of skin, unspecified: Secondary | ICD-10-CM

## 2012-03-09 DIAGNOSIS — E785 Hyperlipidemia, unspecified: Secondary | ICD-10-CM

## 2012-03-09 DIAGNOSIS — I2510 Atherosclerotic heart disease of native coronary artery without angina pectoris: Secondary | ICD-10-CM | POA: Diagnosis not present

## 2012-03-09 DIAGNOSIS — M545 Low back pain, unspecified: Secondary | ICD-10-CM

## 2012-03-09 DIAGNOSIS — I10 Essential (primary) hypertension: Secondary | ICD-10-CM

## 2012-03-09 DIAGNOSIS — M1990 Unspecified osteoarthritis, unspecified site: Secondary | ICD-10-CM | POA: Diagnosis not present

## 2012-03-09 DIAGNOSIS — L570 Actinic keratosis: Secondary | ICD-10-CM | POA: Diagnosis not present

## 2012-03-09 DIAGNOSIS — Z23 Encounter for immunization: Secondary | ICD-10-CM

## 2012-03-09 NOTE — Assessment & Plan Note (Signed)
Continue with current prescription therapy as reflected on the Med list.  

## 2012-03-09 NOTE — Progress Notes (Signed)
Subjective:    Patient ID: David [name], male    DOB: 11-06-1923, 78 y.o.   MRN: [id number]  HPI  The patient presents for a follow-up of  chronic hypertension, chronic dyslipidemia, CAD controlled with medicines. He bought himself a motor home... C/o face skin lesions - new ones  Wt Readings from Last 3 Encounters:  03/09/12 216 lb (97.977 kg)  12/01/11 217 lb (98.431 kg)  08/31/11 219 lb (99.338 kg)    BP Readings from Last 3 Encounters:  03/09/12 150/82  12/01/11 128/60  08/31/11 122/68     Review of Systems  Constitutional: Negative for appetite change, fatigue and unexpected weight change.  HENT: Negative for nosebleeds, congestion, sore throat, sneezing, trouble swallowing and neck pain.   Eyes: Negative for itching and visual disturbance.  Respiratory: Negative for cough.   Cardiovascular: Negative for chest pain, palpitations and leg swelling.  Gastrointestinal: Negative for nausea, diarrhea, blood in stool and abdominal distention.  Genitourinary: Negative for frequency and hematuria.  Musculoskeletal: Negative for back pain, joint swelling and gait problem.  Skin: Negative for rash.  Neurological: Negative for dizziness, tremors, speech difficulty and weakness.  Psychiatric/Behavioral: Negative for suicidal ideas, disturbed wake/sleep cycle, dysphoric mood and agitation. The patient is not nervous/anxious.        Objective:   Physical Exam  Constitutional: He is oriented to person, place, and time. He appears well-developed and well-nourished. No distress.  HENT:  Mouth/Throat: Oropharynx is clear and moist.  Eyes: Conjunctivae normal are normal. Pupils are equal, round, and reactive to light.  Neck: Normal range of motion. No JVD present. No thyromegaly present.  Cardiovascular: Normal rate, regular rhythm, normal heart sounds and intact distal pulses.  Exam reveals no gallop and no friction rub.   No murmur heard. Pulmonary/Chest: Effort normal and breath  sounds normal. No respiratory distress. He has no wheezes. He has no rales. He exhibits no tenderness.  Abdominal: Soft. Bowel sounds are normal. He exhibits no distension and no mass. There is no tenderness. There is no rebound and no guarding.  Musculoskeletal: Normal range of motion. He exhibits no edema and no tenderness.  Lymphadenopathy:    He has no cervical adenopathy.  Neurological: He is alert and oriented to person, place, and time. He has normal reflexes. No cranial nerve deficit. He exhibits normal muscle tone. Coordination normal.  Skin: Skin is warm and dry. No rash noted.  Psychiatric: He has a normal mood and affect. His behavior is normal. Judgment and thought content normal.  AKs nose, cheeks, R ear  Lab Results  Component Value Date   WBC 8.1 05/04/2011   HGB 16.0 05/04/2011   HCT 47.4 05/04/2011   PLT 113.0* 05/04/2011   GLUCOSE 101* 02/28/2012   CHOL 185 02/28/2012   TRIG 99.0 02/28/2012   HDL 49.30 02/28/2012   LDLDIRECT 136.2 08/01/2007   LDLCALC 116* 02/28/2012   ALT 16 02/28/2012   AST 21 02/28/2012   NA 141 02/28/2012   K 4.4 02/28/2012   CL 106 02/28/2012   CREATININE 1.2 02/28/2012   BUN 13 02/28/2012   CO2 28 02/28/2012   TSH 1.95 05/04/2011   PSA 2.04 11/13/2009   INR 1.0 01/08/2007   HGBA1C 5.9 08/31/2011     Procedure Note :     Procedure : Cryosurgery   Indication:   Actinic keratosis(es)   Risks including unsuccessful procedure , bleeding, infection, bruising, scar, a need for a repeat  procedure and others were explained  to the patient in detail as well as the benefits. Informed consent was obtained verbally.   5  lesion(s)  on  Face, wrists  was/were treated with liquid nitrogen on a Q-tip in a usual fasion . Band-Aid was applied and antibiotic ointment was given for a later use.   Tolerated well. Complications none.   Postprocedure instructions :     Keep the wounds clean. You can wash them with liquid soap and water. Pat dry with  gauze or a Kleenex tissue  Before applying antibiotic ointment and a Band-Aid.   You need to report immediately  if  any signs of infection develop.          Assessment & Plan:

## 2012-03-09 NOTE — Assessment & Plan Note (Signed)
Remote L ear 2011

## 2012-03-11 NOTE — Assessment & Plan Note (Signed)
See Cryo 

## 2012-04-26 DIAGNOSIS — E782 Mixed hyperlipidemia: Secondary | ICD-10-CM | POA: Diagnosis not present

## 2012-04-26 DIAGNOSIS — Z951 Presence of aortocoronary bypass graft: Secondary | ICD-10-CM | POA: Diagnosis not present

## 2012-04-26 DIAGNOSIS — I059 Rheumatic mitral valve disease, unspecified: Secondary | ICD-10-CM | POA: Diagnosis not present

## 2012-04-26 DIAGNOSIS — I2510 Atherosclerotic heart disease of native coronary artery without angina pectoris: Secondary | ICD-10-CM | POA: Diagnosis not present

## 2012-04-27 ENCOUNTER — Other Ambulatory Visit (HOSPITAL_COMMUNITY): Payer: Self-pay | Admitting: Cardiovascular Disease

## 2012-04-27 DIAGNOSIS — R0602 Shortness of breath: Secondary | ICD-10-CM

## 2012-04-27 DIAGNOSIS — I350 Nonrheumatic aortic (valve) stenosis: Secondary | ICD-10-CM

## 2012-05-11 ENCOUNTER — Encounter: Payer: Self-pay | Admitting: Internal Medicine

## 2012-05-11 ENCOUNTER — Ambulatory Visit (INDEPENDENT_AMBULATORY_CARE_PROVIDER_SITE_OTHER): Payer: Medicare Other | Admitting: Internal Medicine

## 2012-05-11 VITALS — BP 120/70 | HR 76 | Temp 98.1°F | Resp 16 | Wt 214.0 lb

## 2012-05-11 DIAGNOSIS — J209 Acute bronchitis, unspecified: Secondary | ICD-10-CM

## 2012-05-11 MED ORDER — DOXYCYCLINE HYCLATE 100 MG PO TABS: 100.0000 mg | ORAL_TABLET | Freq: Two times a day (BID) | ORAL | Status: DC

## 2012-05-11 NOTE — Patient Instructions (Addendum)

## 2012-05-11 NOTE — Progress Notes (Signed)
HPI  Pt presents to the clinic today with c/o chest congestion with sputum production and wheezing x 4 days. Pt has had a fever, chills and body aches. He has not taken anything OTC.  The symptoms are getting worse daily. He has had sick contacts.  Review of Systems    Past Medical History  Diagnosis Date  . Current use of long term anticoagulation   . Hyperlipidemia   . Hypertension   . OA (osteoarthritis)   . COPD (chronic obstructive pulmonary disease)   . GERD (gastroesophageal reflux disease)   . LBP (low back pain)   . S/P AVR (aortic valve replacement) 2009    bonine valve  . CAD (coronary artery disease)     intolerance of statins  . Impaired glucose tolerance 02/14/2011    Family History  Problem Relation Age of Onset  . Coronary artery disease Other   . Hypertension Other     History   Social History  . Marital Status: Married    Spouse Name: N/A    Number of Children: N/A  . Years of Education: N/A   Occupational History  . Not on file.   Social History Main Topics  . Smoking status: Former Smoker    Quit date: 05/16/1998  . Smokeless tobacco: Former Neurosurgeon  . Alcohol Use: No  . Drug Use: No  . Sexually Active: No   Other Topics Concern  . Not on file   Social History Narrative  . No narrative on file    Allergies  Allergen Reactions  . Atorvastatin   . Colesevelam      Constitutional: Positive headache, fatigue and fever. Denies abrupt weight changes.  HEENT:  Denies eye redness,eye pain, pressure behind the eyes, facial pain, nasal congestion ear pain, ringing in the ears, wax buildup, runny nose or bloody nose. Respiratory: Positive cough and thick green sputum production. Denies difficulty breathing or shortness of breath.  Cardiovascular: Denies chest pain, chest tightness, palpitations or swelling in the hands or feet.   No other specific complaints in a complete review of systems (except as listed in HPI above).  Objective:     General: Appears his stated age, well developed, well nourished in NAD. HEENT: Head: normal shape and size; Eyes: sclera white, no icterus, conjunctiva pink, PERRLA and EOMs intact; Ears: Tm's gray and intact, normal light reflex; Nose: mucosa pink and moist, septum midline; Throat/Mouth: + PND. Teeth present, mucosa pink and moist, no exudate noted, no lesions or ulcerations noted.  Neck: Mild cervical lymphadenopathy. Neck supple, trachea midline. No massses, lumps or thyromegaly present.  Cardiovascular: Normal rate and rhythm. S1,S2 noted.  No murmur, rubs or gallops noted. No JVD or BLE edema. No carotid bruits noted. Pulmonary/Chest: Normal effort and positive vesicular breath sounds. Intermittent wheezing. No respiratory distress. No rales or ronchi noted.      Assessment & Plan:   Acute Bronchitis . Doxycycline BID for 10 days  RTC as needed or if symptoms persist.

## 2012-05-14 ENCOUNTER — Ambulatory Visit (HOSPITAL_COMMUNITY)
Admission: RE | Admit: 2012-05-14 | Discharge: 2012-05-14 | Disposition: A | Discharge status: Home / Self Care | Payer: Medicare Other | Source: Ambulatory Visit | Attending: Cardiovascular Disease | Admitting: Cardiovascular Disease

## 2012-05-14 DIAGNOSIS — I359 Nonrheumatic aortic valve disorder, unspecified: Secondary | ICD-10-CM | POA: Insufficient documentation

## 2012-05-14 DIAGNOSIS — R0602 Shortness of breath: Secondary | ICD-10-CM | POA: Insufficient documentation

## 2012-05-14 DIAGNOSIS — I350 Nonrheumatic aortic (valve) stenosis: Secondary | ICD-10-CM

## 2012-05-14 DIAGNOSIS — I2510 Atherosclerotic heart disease of native coronary artery without angina pectoris: Secondary | ICD-10-CM | POA: Diagnosis not present

## 2012-05-14 HISTORY — PX: TRANSTHORACIC ECHOCARDIOGRAM: SHX275

## 2012-05-14 NOTE — Progress Notes (Signed)
[facility]   2D echo completed 05/14/2012.   Cindy Nikai Quest, RDCS   

## 2012-07-11 ENCOUNTER — Ambulatory Visit (INDEPENDENT_AMBULATORY_CARE_PROVIDER_SITE_OTHER): Payer: Medicare Other | Admitting: Internal Medicine

## 2012-07-11 ENCOUNTER — Encounter: Payer: Self-pay | Admitting: Internal Medicine

## 2012-07-11 VITALS — BP 120/78 | HR 84 | Temp 98.0°F | Resp 16 | Wt 218.0 lb

## 2012-07-11 DIAGNOSIS — R7309 Other abnormal glucose: Secondary | ICD-10-CM

## 2012-07-11 DIAGNOSIS — R059 Cough, unspecified: Secondary | ICD-10-CM

## 2012-07-11 DIAGNOSIS — I10 Essential (primary) hypertension: Secondary | ICD-10-CM | POA: Diagnosis not present

## 2012-07-11 DIAGNOSIS — E785 Hyperlipidemia, unspecified: Secondary | ICD-10-CM | POA: Diagnosis not present

## 2012-07-11 DIAGNOSIS — K219 Gastro-esophageal reflux disease without esophagitis: Secondary | ICD-10-CM

## 2012-07-11 MED ORDER — LISINOPRIL 5 MG PO TABS: 5.0000 mg | ORAL_TABLET | Freq: Every day | ORAL | Status: DC

## 2012-07-11 NOTE — Progress Notes (Signed)
   Subjective:    HPI  The patient presents for a follow-up of  chronic hypertension, chronic dyslipidemia, CAD controlled with medicines. He bought himself a motor home... He just had a bronchitis  Wt Readings from Last 3 Encounters:  07/11/12 218 lb (98.884 kg)  05/11/12 214 lb (97.07 kg)  03/09/12 216 lb (97.977 kg)    BP Readings from Last 3 Encounters:  07/11/12 120/78  05/11/12 120/70  03/09/12 150/82     Review of Systems  Constitutional: Negative for appetite change, fatigue and unexpected weight change.  HENT: Negative for nosebleeds, congestion, sore throat, sneezing, trouble swallowing and neck pain.   Eyes: Negative for itching and visual disturbance.  Respiratory: Negative for cough.   Cardiovascular: Negative for chest pain, palpitations and leg swelling.  Gastrointestinal: Negative for nausea, diarrhea, blood in stool and abdominal distention.  Genitourinary: Negative for frequency and hematuria.  Musculoskeletal: Negative for back pain, joint swelling and gait problem.  Skin: Negative for rash.  Neurological: Negative for dizziness, tremors, speech difficulty and weakness.  Psychiatric/Behavioral: Negative for suicidal ideas, sleep disturbance, dysphoric mood and agitation. The patient is not nervous/anxious.        Objective:   Physical Exam  Constitutional: He is oriented to person, place, and time. He appears well-developed and well-nourished. No distress.  HENT:  Mouth/Throat: Oropharynx is clear and moist.  Eyes: Conjunctivae are normal. Pupils are equal, round, and reactive to light.  Neck: Normal range of motion. No JVD present. No thyromegaly present.  Cardiovascular: Normal rate, regular rhythm, normal heart sounds and intact distal pulses.  Exam reveals no gallop and no friction rub.   No murmur heard. Pulmonary/Chest: Effort normal and breath sounds normal. No respiratory distress. He has no wheezes. He has no rales. He exhibits no tenderness.   Abdominal: Soft. Bowel sounds are normal. He exhibits no distension and no mass. There is no tenderness. There is no rebound and no guarding.  Musculoskeletal: Normal range of motion. He exhibits no edema and no tenderness.  Lymphadenopathy:    He has no cervical adenopathy.  Neurological: He is alert and oriented to person, place, and time. He has normal reflexes. No cranial nerve deficit. He exhibits normal muscle tone. Coordination normal.  Skin: Skin is warm and dry. No rash noted.  Psychiatric: He has a normal mood and affect. His behavior is normal. Judgment and thought content normal.    Lab Results  Component Value Date   WBC 8.1 05/04/2011   HGB 16.0 05/04/2011   HCT 47.4 05/04/2011   PLT 113.0* 05/04/2011   GLUCOSE 101* 02/28/2012   CHOL 185 02/28/2012   TRIG 99.0 02/28/2012   HDL 49.30 02/28/2012   LDLDIRECT 136.2 08/01/2007   LDLCALC 116* 02/28/2012   ALT 16 02/28/2012   AST 21 02/28/2012   NA 141 02/28/2012   K 4.4 02/28/2012   CL 106 02/28/2012   CREATININE 1.2 02/28/2012   BUN 13 02/28/2012   CO2 28 02/28/2012   TSH 1.95 05/04/2011   PSA 2.04 11/13/2009   INR 1.0 01/08/2007   HGBA1C 5.9 08/31/2011             Assessment & Plan:

## 2012-07-11 NOTE — Assessment & Plan Note (Signed)
Due to URI - resolved

## 2012-07-11 NOTE — Assessment & Plan Note (Signed)
Prn meds 

## 2012-07-11 NOTE — Assessment & Plan Note (Signed)
  On diet  

## 2012-07-11 NOTE — Assessment & Plan Note (Signed)
Continue with current prescription therapy as reflected on the Med list.  

## 2012-10-01 ENCOUNTER — Other Ambulatory Visit: Payer: Self-pay | Admitting: Internal Medicine

## 2012-11-08 ENCOUNTER — Encounter: Payer: Medicare Other | Admitting: Internal Medicine

## 2012-11-12 ENCOUNTER — Ambulatory Visit (INDEPENDENT_AMBULATORY_CARE_PROVIDER_SITE_OTHER): Payer: Medicare Other | Admitting: Internal Medicine

## 2012-11-12 ENCOUNTER — Other Ambulatory Visit (INDEPENDENT_AMBULATORY_CARE_PROVIDER_SITE_OTHER): Payer: Medicare Other

## 2012-11-12 ENCOUNTER — Encounter: Payer: Self-pay | Admitting: Internal Medicine

## 2012-11-12 VITALS — BP 130/82 | HR 76 | Temp 97.1°F | Resp 16 | Ht 70.0 in | Wt 217.0 lb

## 2012-11-12 DIAGNOSIS — Z0000 Encounter for general adult medical examination without abnormal findings: Secondary | ICD-10-CM

## 2012-11-12 DIAGNOSIS — N320 Bladder-neck obstruction: Secondary | ICD-10-CM

## 2012-11-12 DIAGNOSIS — E785 Hyperlipidemia, unspecified: Secondary | ICD-10-CM

## 2012-11-12 DIAGNOSIS — C439 Malignant melanoma of skin, unspecified: Secondary | ICD-10-CM

## 2012-11-12 DIAGNOSIS — I2510 Atherosclerotic heart disease of native coronary artery without angina pectoris: Secondary | ICD-10-CM | POA: Diagnosis not present

## 2012-11-12 DIAGNOSIS — I10 Essential (primary) hypertension: Secondary | ICD-10-CM

## 2012-11-12 DIAGNOSIS — D696 Thrombocytopenia, unspecified: Secondary | ICD-10-CM

## 2012-11-12 LAB — HEPATIC FUNCTION PANEL
AST: 25 U/L (ref 0–37)
Albumin: 4.4 g/dL (ref 3.5–5.2)
Alkaline Phosphatase: 48 U/L (ref 39–117)
Total Protein: 7.7 g/dL (ref 6.0–8.3)

## 2012-11-12 LAB — CBC WITH DIFFERENTIAL/PLATELET
Basophils Relative: 0.3 % (ref 0.0–3.0)
Eosinophils Absolute: 0.3 10*3/uL (ref 0.0–0.7)
MCHC: 33.6 g/dL (ref 30.0–36.0)
MCV: 92 fl (ref 78.0–100.0)
Monocytes Absolute: 0.8 10*3/uL (ref 0.1–1.0)
Neutrophils Relative %: 56.1 % (ref 43.0–77.0)
Platelets: 114 10*3/uL — ABNORMAL LOW (ref 150.0–400.0)
RBC: 5.59 Mil/uL (ref 4.22–5.81)
RDW: 14.3 % (ref 11.5–14.6)

## 2012-11-12 LAB — URINALYSIS
Bilirubin Urine: NEGATIVE
Hgb urine dipstick: NEGATIVE
Nitrite: NEGATIVE

## 2012-11-12 LAB — BASIC METABOLIC PANEL
BUN: 14 mg/dL (ref 6–23)
CO2: 24 mEq/L (ref 19–32)
Chloride: 105 mEq/L (ref 96–112)
Creatinine, Ser: 1.3 mg/dL (ref 0.4–1.5)
Glucose, Bld: 99 mg/dL (ref 70–99)

## 2012-11-12 NOTE — Assessment & Plan Note (Signed)
PSA Discussed options

## 2012-11-12 NOTE — Assessment & Plan Note (Signed)
Continue with current prescription therapy as reflected on the Med list.  

## 2012-11-12 NOTE — Assessment & Plan Note (Signed)
cbc

## 2012-11-12 NOTE — Progress Notes (Signed)
   Subjective:    HPI  The patient is here for a wellness exam. The patient has been doing well overall without major physical or psychological issues going on lately.  The patient presents for a follow-up of  chronic hypertension, chronic dyslipidemia, CAD controlled with medicines. He bought himself a motor home... He just had a bronchitis  Wt Readings from Last 3 Encounters:  11/12/12 217 lb (98.431 kg)  07/11/12 218 lb (98.884 kg)  05/11/12 214 lb (97.07 kg)    BP Readings from Last 3 Encounters:  11/12/12 130/82  07/11/12 120/78  05/11/12 120/70     Review of Systems  Constitutional: Negative for appetite change, fatigue and unexpected weight change.  HENT: Negative for nosebleeds, congestion, sore throat, sneezing, trouble swallowing and neck pain.   Eyes: Negative for itching and visual disturbance.  Respiratory: Negative for cough.   Cardiovascular: Negative for chest pain, palpitations and leg swelling.  Gastrointestinal: Negative for nausea, diarrhea, blood in stool and abdominal distention.  Genitourinary: Negative for frequency and hematuria.  Musculoskeletal: Negative for back pain, joint swelling and gait problem.  Skin: Negative for rash.  Neurological: Negative for dizziness, tremors, speech difficulty and weakness.  Psychiatric/Behavioral: Negative for suicidal ideas, sleep disturbance, dysphoric mood and agitation. The patient is not nervous/anxious.        Objective:   Physical Exam  Constitutional: He is oriented to person, place, and time. He appears well-developed and well-nourished. No distress.  HENT:  Mouth/Throat: Oropharynx is clear and moist.  Eyes: Conjunctivae are normal. Pupils are equal, round, and reactive to light.  Neck: Normal range of motion. No JVD present. No thyromegaly present.  Cardiovascular: Normal rate, regular rhythm, normal heart sounds and intact distal pulses.  Exam reveals no gallop and no friction rub.   No murmur  heard. Pulmonary/Chest: Effort normal and breath sounds normal. No respiratory distress. He has no wheezes. He has no rales. He exhibits no tenderness.  Abdominal: Soft. Bowel sounds are normal. He exhibits no distension and no mass. There is no tenderness. There is no rebound and no guarding.  Musculoskeletal: Normal range of motion. He exhibits no edema and no tenderness.  Lymphadenopathy:    He has no cervical adenopathy.  Neurological: He is alert and oriented to person, place, and time. He has normal reflexes. No cranial nerve deficit. He exhibits normal muscle tone. Coordination normal.  Skin: Skin is warm and dry. No rash noted.  Psychiatric: He has a normal mood and affect. His behavior is normal. Judgment and thought content normal.    Lab Results  Component Value Date   WBC 8.1 05/04/2011   HGB 16.0 05/04/2011   HCT 47.4 05/04/2011   PLT 113.0* 05/04/2011   GLUCOSE 101* 02/28/2012   CHOL 185 02/28/2012   TRIG 99.0 02/28/2012   HDL 49.30 02/28/2012   LDLDIRECT 136.2 08/01/2007   LDLCALC 116* 02/28/2012   ALT 16 02/28/2012   AST 21 02/28/2012   NA 141 02/28/2012   K 4.4 02/28/2012   CL 106 02/28/2012   CREATININE 1.2 02/28/2012   BUN 13 02/28/2012   CO2 28 02/28/2012   TSH 1.95 05/04/2011   PSA 2.04 11/13/2009   INR 1.0 01/08/2007   HGBA1C 5.9 08/31/2011             Assessment & Plan:

## 2012-11-12 NOTE — Patient Instructions (Addendum)
Zostavax

## 2012-11-12 NOTE — Assessment & Plan Note (Signed)
On zetia.  

## 2012-11-12 NOTE — Assessment & Plan Note (Signed)
Derm f/u 

## 2012-11-12 NOTE — Assessment & Plan Note (Addendum)
Here for medicare wellness/physical  Diet: heart healthy  Physical activity: sedentary  Depression/mood screen: negative  Hearing: intact to whispered voice  Visual acuity: grossly normal, performs annual eye exam  ADLs: capable  Fall risk: none  Home safety: good  Cognitive evaluation: intact to orientation, naming, recall and repetition  EOL planning: adv directives, full code/ I agree  I have personally reviewed and have noted  1. The patient's medical and social history  2. Their use of alcohol, tobacco or illicit drugs  3. Their current medications and supplements  4. The patient's functional ability including ADL's, fall risks, home safety risks and hearing or visual impairment.  5. Diet and physical activities  6. Evidence for depression or mood disorders    Today patient counseled on age appropriate routine health concerns for screening and prevention, each reviewed and up to date or declined. Immunizations reviewed and up to date or declined. Labs ordered and reviewed. Risk factors for depression reviewed and negative. Hearing function and visual acuity are intact. ADLs screened and addressed as needed. Functional ability and level of safety reviewed and appropriate. Education, counseling and referrals performed based on assessed risks today. Patient provided with a copy of personalized plan for preventive services.   Zostavax adviced

## 2012-11-13 ENCOUNTER — Other Ambulatory Visit: Payer: Self-pay | Admitting: *Deleted

## 2012-11-13 DIAGNOSIS — D582 Other hemoglobinopathies: Secondary | ICD-10-CM

## 2012-12-14 ENCOUNTER — Encounter: Payer: Self-pay | Admitting: Internal Medicine

## 2012-12-14 ENCOUNTER — Other Ambulatory Visit (INDEPENDENT_AMBULATORY_CARE_PROVIDER_SITE_OTHER): Payer: Medicare Other

## 2012-12-14 ENCOUNTER — Ambulatory Visit (INDEPENDENT_AMBULATORY_CARE_PROVIDER_SITE_OTHER): Payer: Medicare Other | Admitting: Internal Medicine

## 2012-12-14 VITALS — BP 135/80 | HR 68 | Temp 97.9°F | Resp 16 | Wt 219.0 lb

## 2012-12-14 DIAGNOSIS — D582 Other hemoglobinopathies: Secondary | ICD-10-CM | POA: Diagnosis not present

## 2012-12-14 DIAGNOSIS — D696 Thrombocytopenia, unspecified: Secondary | ICD-10-CM | POA: Diagnosis not present

## 2012-12-14 DIAGNOSIS — I10 Essential (primary) hypertension: Secondary | ICD-10-CM | POA: Diagnosis not present

## 2012-12-14 LAB — CBC WITH DIFFERENTIAL/PLATELET
Basophils Absolute: 0 10*3/uL (ref 0.0–0.1)
Eosinophils Absolute: 0.3 10*3/uL (ref 0.0–0.7)
Eosinophils Relative: 3.9 % (ref 0.0–5.0)
Lymphocytes Relative: 39.5 % (ref 12.0–46.0)
Lymphs Abs: 2.9 10*3/uL (ref 0.7–4.0)
MCV: 91.8 fl (ref 78.0–100.0)
Neutrophils Relative %: 46.7 % (ref 43.0–77.0)
Platelets: 104 10*3/uL — ABNORMAL LOW (ref 150.0–400.0)
WBC: 7.3 10*3/uL (ref 4.5–10.5)

## 2012-12-14 NOTE — Patient Instructions (Signed)
If BP is elevated - start Atenolol bid

## 2012-12-14 NOTE — Assessment & Plan Note (Signed)
Repeat CBC

## 2012-12-14 NOTE — Assessment & Plan Note (Signed)
Chronic  States BP is nl at home If elevated - start Atenolol bid

## 2012-12-14 NOTE — Progress Notes (Signed)
   Subjective:    HPI  F/u low platelets, elevated BP. BP is nl at home.  The patient presents for a follow-up of  chronic hypertension, chronic dyslipidemia, CAD controlled with medicines. He bought himself a motor home... He just had a bronchitis  Wt Readings from Last 3 Encounters:  12/14/12 219 lb (99.338 kg)  11/12/12 217 lb (98.431 kg)  07/11/12 218 lb (98.884 kg)    BP Readings from Last 3 Encounters:  12/14/12 140/90  11/12/12 130/82  07/11/12 120/78     Review of Systems  Constitutional: Negative for appetite change, fatigue and unexpected weight change.  HENT: Negative for nosebleeds, congestion, sore throat, sneezing, trouble swallowing and neck pain.   Eyes: Negative for itching and visual disturbance.  Respiratory: Negative for cough.   Cardiovascular: Negative for chest pain, palpitations and leg swelling.  Gastrointestinal: Negative for nausea, diarrhea, blood in stool and abdominal distention.  Genitourinary: Negative for frequency and hematuria.  Musculoskeletal: Negative for back pain, joint swelling and gait problem.  Skin: Negative for rash.  Neurological: Negative for dizziness, tremors, speech difficulty and weakness.  Psychiatric/Behavioral: Negative for suicidal ideas, sleep disturbance, dysphoric mood and agitation. The patient is not nervous/anxious.        Objective:   Physical Exam  Constitutional: He is oriented to person, place, and time. He appears well-developed and well-nourished. No distress.  HENT:  Mouth/Throat: Oropharynx is clear and moist.  Eyes: Conjunctivae are normal. Pupils are equal, round, and reactive to light.  Neck: Normal range of motion. No JVD present. No thyromegaly present.  Cardiovascular: Normal rate, regular rhythm, normal heart sounds and intact distal pulses.  Exam reveals no gallop and no friction rub.   No murmur heard. Pulmonary/Chest: Effort normal and breath sounds normal. No respiratory distress. He has no  wheezes. He has no rales. He exhibits no tenderness.  Abdominal: Soft. Bowel sounds are normal. He exhibits no distension and no mass. There is no tenderness. There is no rebound and no guarding.  Musculoskeletal: Normal range of motion. He exhibits no edema and no tenderness.  Lymphadenopathy:    He has no cervical adenopathy.  Neurological: He is alert and oriented to person, place, and time. He has normal reflexes. No cranial nerve deficit. He exhibits normal muscle tone. Coordination normal.  Skin: Skin is warm and dry. No rash noted.  Psychiatric: He has a normal mood and affect. His behavior is normal. Judgment and thought content normal.    Lab Results  Component Value Date   WBC 8.3 11/12/2012   HGB 17.3* 11/12/2012   HCT 51.4 11/12/2012   PLT 114.0* 11/12/2012   GLUCOSE 99 11/12/2012   CHOL 199 11/12/2012   TRIG 149.0 11/12/2012   HDL 52.10 11/12/2012   LDLDIRECT 136.2 08/01/2007   LDLCALC 117* 11/12/2012   ALT 22 11/12/2012   AST 25 11/12/2012   NA 140 11/12/2012   K 4.5 11/12/2012   CL 105 11/12/2012   CREATININE 1.3 11/12/2012   BUN 14 11/12/2012   CO2 24 11/12/2012   TSH 2.21 11/12/2012   PSA 2.36 11/12/2012   INR 1.0 01/08/2007   HGBA1C 5.9 08/31/2011             Assessment & Plan:

## 2012-12-14 NOTE — Assessment & Plan Note (Signed)
6/14 ?etiology Denies OSA CBC

## 2012-12-31 ENCOUNTER — Other Ambulatory Visit: Payer: Self-pay | Admitting: Internal Medicine

## 2013-01-30 DIAGNOSIS — E782 Mixed hyperlipidemia: Secondary | ICD-10-CM | POA: Diagnosis not present

## 2013-01-30 DIAGNOSIS — I2510 Atherosclerotic heart disease of native coronary artery without angina pectoris: Secondary | ICD-10-CM | POA: Diagnosis not present

## 2013-02-07 ENCOUNTER — Encounter: Payer: Self-pay | Admitting: Cardiovascular Disease

## 2013-02-18 ENCOUNTER — Ambulatory Visit (HOSPITAL_COMMUNITY)
Admission: RE | Admit: 2013-02-18 | Discharge: 2013-02-18 | Disposition: A | Discharge status: Home / Self Care | Payer: Medicare Other | Source: Ambulatory Visit | Attending: Cardiovascular Disease | Admitting: Cardiovascular Disease

## 2013-02-18 ENCOUNTER — Other Ambulatory Visit (HOSPITAL_COMMUNITY): Payer: Self-pay | Admitting: Cardiovascular Disease

## 2013-02-18 DIAGNOSIS — R0989 Other specified symptoms and signs involving the circulatory and respiratory systems: Secondary | ICD-10-CM | POA: Diagnosis not present

## 2013-02-18 NOTE — Progress Notes (Signed)
Carotid Duplex Completed. Etoile Looman, BS, RDMS, RVT  

## 2013-02-22 ENCOUNTER — Encounter: Payer: Self-pay | Admitting: *Deleted

## 2013-05-14 ENCOUNTER — Ambulatory Visit: Payer: Medicare Other | Admitting: Internal Medicine

## 2013-06-18 ENCOUNTER — Ambulatory Visit (INDEPENDENT_AMBULATORY_CARE_PROVIDER_SITE_OTHER): Payer: Medicare Other | Admitting: Internal Medicine

## 2013-06-18 ENCOUNTER — Encounter: Payer: Self-pay | Admitting: Internal Medicine

## 2013-06-18 ENCOUNTER — Other Ambulatory Visit (INDEPENDENT_AMBULATORY_CARE_PROVIDER_SITE_OTHER): Payer: Medicare Other

## 2013-06-18 VITALS — BP 150/80 | HR 80 | Resp 16 | Wt 217.0 lb

## 2013-06-18 DIAGNOSIS — M1990 Unspecified osteoarthritis, unspecified site: Secondary | ICD-10-CM

## 2013-06-18 DIAGNOSIS — I10 Essential (primary) hypertension: Secondary | ICD-10-CM | POA: Diagnosis not present

## 2013-06-18 DIAGNOSIS — I2510 Atherosclerotic heart disease of native coronary artery without angina pectoris: Secondary | ICD-10-CM

## 2013-06-18 DIAGNOSIS — R609 Edema, unspecified: Secondary | ICD-10-CM | POA: Diagnosis not present

## 2013-06-18 DIAGNOSIS — R7302 Impaired glucose tolerance (oral): Secondary | ICD-10-CM

## 2013-06-18 DIAGNOSIS — R7309 Other abnormal glucose: Secondary | ICD-10-CM

## 2013-06-18 LAB — CBC WITH DIFFERENTIAL/PLATELET
BASOS PCT: 0.5 % (ref 0.0–3.0)
Basophils Absolute: 0 10*3/uL (ref 0.0–0.1)
EOS PCT: 3.5 % (ref 0.0–5.0)
Eosinophils Absolute: 0.3 10*3/uL (ref 0.0–0.7)
HCT: 50 % (ref 39.0–52.0)
Hemoglobin: 16.4 g/dL (ref 13.0–17.0)
LYMPHS PCT: 38.8 % (ref 12.0–46.0)
Lymphs Abs: 2.9 10*3/uL (ref 0.7–4.0)
MCHC: 32.8 g/dL (ref 30.0–36.0)
MCV: 93.8 fl (ref 78.0–100.0)
MONO ABS: 0.7 10*3/uL (ref 0.1–1.0)
Monocytes Relative: 9.8 % (ref 3.0–12.0)
NEUTROS PCT: 47.4 % (ref 43.0–77.0)
Neutro Abs: 3.5 10*3/uL (ref 1.4–7.7)
Platelets: 117 10*3/uL — ABNORMAL LOW (ref 150.0–400.0)
RBC: 5.34 Mil/uL (ref 4.22–5.81)
RDW: 13.6 % (ref 11.5–14.6)
WBC: 7.4 10*3/uL (ref 4.5–10.5)

## 2013-06-18 LAB — BASIC METABOLIC PANEL
BUN: 18 mg/dL (ref 6–23)
CHLORIDE: 106 meq/L (ref 96–112)
CO2: 28 mEq/L (ref 19–32)
Calcium: 9.5 mg/dL (ref 8.4–10.5)
Creatinine, Ser: 1.3 mg/dL (ref 0.4–1.5)
GFR: 57.42 mL/min — ABNORMAL LOW (ref >60.00)
GLUCOSE: 111 mg/dL — AB (ref 70–99)
POTASSIUM: 4.2 meq/L (ref 3.5–5.1)
Sodium: 140 mEq/L (ref 135–145)

## 2013-06-18 NOTE — Assessment & Plan Note (Signed)
Resolved

## 2013-06-18 NOTE — Assessment & Plan Note (Signed)
Continue with current prescription therapy as reflected on the Med list.  

## 2013-06-18 NOTE — Progress Notes (Signed)
   Subjective:    HPI  F/u low platelets, elevated BP. BP is nl at home.  The patient presents for a follow-up of  chronic hypertension, chronic dyslipidemia, CAD controlled with medicines. He did get to use his motor home...   Wt Readings from Last 3 Encounters:  06/18/13 217 lb (98.431 kg)  12/14/12 219 lb (99.338 kg)  11/12/12 217 lb (98.431 kg)    BP Readings from Last 3 Encounters:  06/18/13 150/80  12/14/12 135/80  11/12/12 130/82     Review of Systems  Constitutional: Negative for appetite change, fatigue and unexpected weight change.  HENT: Negative for congestion, nosebleeds, sneezing, sore throat and trouble swallowing.   Eyes: Negative for itching and visual disturbance.  Respiratory: Negative for cough.   Cardiovascular: Negative for chest pain, palpitations and leg swelling.  Gastrointestinal: Negative for nausea, diarrhea, blood in stool and abdominal distention.  Genitourinary: Negative for frequency and hematuria.  Musculoskeletal: Negative for back pain, gait problem, joint swelling and neck pain.  Skin: Negative for rash.  Neurological: Negative for dizziness, tremors, speech difficulty and weakness.  Psychiatric/Behavioral: Negative for suicidal ideas, sleep disturbance, dysphoric mood and agitation. The patient is not nervous/anxious.        Objective:   Physical Exam  Constitutional: He is oriented to person, place, and time. He appears well-developed and well-nourished. No distress.  HENT:  Mouth/Throat: Oropharynx is clear and moist.  Eyes: Conjunctivae are normal. Pupils are equal, round, and reactive to light.  Neck: Normal range of motion. No JVD present. No thyromegaly present.  Cardiovascular: Normal rate, regular rhythm, normal heart sounds and intact distal pulses.  Exam reveals no gallop and no friction rub.   No murmur heard. Pulmonary/Chest: Effort normal and breath sounds normal. No respiratory distress. He has no wheezes. He has no  rales. He exhibits no tenderness.  Abdominal: Soft. Bowel sounds are normal. He exhibits no distension and no mass. There is no tenderness. There is no rebound and no guarding.  Musculoskeletal: Normal range of motion. He exhibits no edema and no tenderness.  Lymphadenopathy:    He has no cervical adenopathy.  Neurological: He is alert and oriented to person, place, and time. He has normal reflexes. No cranial nerve deficit. He exhibits normal muscle tone. Coordination normal.  Skin: Skin is warm and dry. No rash noted.  Psychiatric: He has a normal mood and affect. His behavior is normal. Judgment and thought content normal.    Lab Results  Component Value Date   WBC 7.3 12/14/2012   HGB 16.0 12/14/2012   HCT 48.1 12/14/2012   PLT 104.0* 12/14/2012   GLUCOSE 99 11/12/2012   CHOL 199 11/12/2012   TRIG 149.0 11/12/2012   HDL 52.10 11/12/2012   LDLDIRECT 136.2 08/01/2007   LDLCALC 117* 11/12/2012   ALT 22 11/12/2012   AST 25 11/12/2012   NA 140 11/12/2012   K 4.5 11/12/2012   CL 105 11/12/2012   CREATININE 1.3 11/12/2012   BUN 14 11/12/2012   CO2 24 11/12/2012   TSH 2.21 11/12/2012   PSA 2.36 11/12/2012   INR 1.0 01/08/2007   HGBA1C 5.9 08/31/2011      Assessment & Plan:

## 2013-06-18 NOTE — Assessment & Plan Note (Signed)
Labs

## 2013-06-18 NOTE — Progress Notes (Signed)
Pre visit review using our clinic review tool, if applicable. No additional management support is needed unless otherwise documented below in the visit note. 

## 2013-08-19 ENCOUNTER — Other Ambulatory Visit (HOSPITAL_COMMUNITY): Payer: Self-pay | Admitting: Cardiovascular Disease

## 2013-08-19 DIAGNOSIS — R0989 Other specified symptoms and signs involving the circulatory and respiratory systems: Secondary | ICD-10-CM

## 2013-09-02 ENCOUNTER — Ambulatory Visit (HOSPITAL_COMMUNITY)
Admission: RE | Admit: 2013-09-02 | Discharge: 2013-09-02 | Disposition: A | Discharge status: Home / Self Care | Payer: Medicare Other | Source: Ambulatory Visit | Attending: Cardiovascular Disease | Admitting: Cardiovascular Disease

## 2013-09-02 DIAGNOSIS — R0989 Other specified symptoms and signs involving the circulatory and respiratory systems: Secondary | ICD-10-CM

## 2013-10-03 ENCOUNTER — Telehealth: Payer: Self-pay | Admitting: Cardiovascular Disease

## 2013-10-03 NOTE — Telephone Encounter (Signed)
Pt. Informed his results will be called to him tomarrow, pt. Stated understanding

## 2013-10-03 NOTE — Telephone Encounter (Signed)
Patient is a former patient of Dr. Rollene Fare.  He has an ultrasound done in April and has not heard the results.

## 2013-10-27 ENCOUNTER — Other Ambulatory Visit: Payer: Self-pay | Admitting: Internal Medicine

## 2013-10-28 ENCOUNTER — Ambulatory Visit: Payer: Medicare Other | Admitting: Cardiovascular Disease

## 2013-10-31 ENCOUNTER — Ambulatory Visit (INDEPENDENT_AMBULATORY_CARE_PROVIDER_SITE_OTHER): Payer: Medicare Other | Admitting: Cardiovascular Disease

## 2013-10-31 VITALS — BP 156/86 | HR 70 | Ht 69.0 in | Wt 218.4 lb

## 2013-10-31 DIAGNOSIS — Z954 Presence of other heart-valve replacement: Secondary | ICD-10-CM

## 2013-10-31 DIAGNOSIS — I359 Nonrheumatic aortic valve disorder, unspecified: Secondary | ICD-10-CM

## 2013-10-31 DIAGNOSIS — I2510 Atherosclerotic heart disease of native coronary artery without angina pectoris: Secondary | ICD-10-CM

## 2013-10-31 DIAGNOSIS — I10 Essential (primary) hypertension: Secondary | ICD-10-CM

## 2013-10-31 DIAGNOSIS — R609 Edema, unspecified: Secondary | ICD-10-CM

## 2013-10-31 DIAGNOSIS — Z952 Presence of prosthetic heart valve: Secondary | ICD-10-CM

## 2013-10-31 DIAGNOSIS — E785 Hyperlipidemia, unspecified: Secondary | ICD-10-CM

## 2013-10-31 MED ORDER — LISINOPRIL 10 MG PO TABS: 10.0000 mg | ORAL_TABLET | Freq: Every day | ORAL | Status: DC

## 2013-10-31 NOTE — Patient Instructions (Addendum)
Your physician has recommended you make the following change in your medication: increase the lisinopril to 10 mg this has already been sent to the pharmacy.  Your physician has requested that you have an echocardiogram. Echocardiography is a painless test that uses sound waves to create images of your heart. It provides your doctor with information about the size and shape of your heart and how well your heart's chambers and valves are working. This procedure takes approximately one hour. There are no restrictions for this procedure. This will be done in 6 months.  Your physician recommends that you schedule a follow-up appointment in: 6 months.

## 2013-11-04 ENCOUNTER — Encounter: Payer: Self-pay | Admitting: Cardiovascular Disease

## 2013-11-04 DIAGNOSIS — Z952 Presence of prosthetic heart valve: Secondary | ICD-10-CM | POA: Insufficient documentation

## 2013-11-04 NOTE — Progress Notes (Signed)
Patient ID: David [name], male   DOB: November 11, 1923, 80 y.o.   MRN: [id number]     PATIENT PROFILE: David [name] is a 80 y.o. male who presents to the office to establish cardiology care with me.  He is a former patient of Dr. Rollene Fare who is retired from his recent Geographical information systems officer.   HPI:  [name] is a 80 y.o. male who has a history of hyperlipidemia, and valvular heart disease.  In 2000, he underwent CABG revascularization surgery and had a LIMA placed to his LAD, SVG to diagonal vessel, sequential vein graft to 2 marginal branches of his left circumflex artery, and a vein graft to the distal right coronary artery.  At that time, he had a porcine aortic valve replacement for aortic stenosis.  A repeat cardiac catheterization in August 2008, showed significant native CAD, but with patent grafts. A nuclear perfusion study in 2011, remained low risk.  An echo Doppler study in December 2013 showed moderate left ventricular hypertrophy with an ejection fraction of 50-55%.  His aortic bioprosthesis remained competent with suggestion of very mild stenosis.  The peak gradient was 19 mm.  Valve area 1.5 cm.  He last saw Dr. Rollene Fare in September 2014.  He's had mild carotid bruits and has undergone carotid studies.  He denies any recent anginal symptoms or palpitations.  He denies any episodes of syncope or presyncope.  He does have a history of hyperlipidemia, for which he has been on Zetia.  He has been intolerant to statins.  He presents to the office to establish cardiology care with me.  Past Medical History  Diagnosis Date  . Current use of long term anticoagulation   . Hyperlipidemia   . Hypertension   . OA (osteoarthritis)   . COPD (chronic obstructive pulmonary disease)   . GERD (gastroesophageal reflux disease)   . LBP (low back pain)   . S/P AVR (aortic valve replacement) 2009    bonine valve  . CAD (coronary artery disease)     intolerance of statins  . Impaired glucose tolerance  02/14/2011    Past Surgical History  Procedure Laterality Date  . Aortic valve replacement  01/15/1999    bovine  . Coronary artery bypass graft  10/09/1998    LIMA to LAD, SVG to diag, SVG sequential to 2 marginals, SVG sequential to PDA & PLA. CE#25 porcine AVR.  Marland Kitchen Carotid doppler  04/22/2010    Bilateral ICAs-no evidence of diametere reduction, significant tortuosity, or any other vascular abnormality.  . Cardiac catheterization  10/05/1998    Recommend CABG revascularizaition  . Cardiac catheterization  06/11/1999    Continue medical therapy  . Cardiac catheterization  01/08/2007    Patent grafts, recommend medical therapy  . Cardiovascular stress test  04/22/2010    Small lateral defect which is partially reversible, no ischemic EKG changes were noted.  . Transthoracic echocardiogram  05/14/2012    EF 50-55%, bioprosthesis of the aortic valve with well preserved LV function, moderate concentric [place]    Allergies  Allergen Reactions  . Atorvastatin   . Colesevelam   . Simvastatin   . Sulfa Antibiotics     Current Outpatient Prescriptions  Medication Sig Dispense Refill  . aspirin 81 MG tablet Take 81 mg by mouth daily.        Marland Kitchen atenolol (TENORMIN) 25 MG tablet Take one tablet by mouth one time daily  90 tablet  2  . doxycycline (VIBRA-TABS) 100 MG tablet Take  1 tablet (100 mg total) by mouth 2 (two) times daily.  20 tablet  0  . furosemide (LASIX) 20 MG tablet Take 20 mg by mouth daily.      Marland Kitchen lisinopril (PRINIVIL,ZESTRIL) 10 MG tablet Take 1 tablet (10 mg total) by mouth daily.  30 tablet  6  . triamcinolone (KENALOG) 0.5 % cream Apply 1 application topically 2 (two) times daily.        Marland Kitchen ZETIA 10 MG tablet TAKE ONE TABLET BY MOUTH ONE TIME DAILY  30 tablet  10   No current facility-administered medications for this visit.    Socially, he is widowed.  4 children 6 grandchildren, and 12 great-grandchildren.  He does walk.  There is no tobacco use.  Family History  Problem  Relation Age of Onset  . Coronary artery disease Other   . Hypertension Other     ROS General: Negative; No fevers, chills, or night sweats HEENT: Negative; No changes in vision or hearing, sinus congestion, difficulty swallowing Pulmonary: Negative; No cough, wheezing, shortness of breath, hemoptysis Cardiovascular:  See HPI; No chest pain, presyncope, syncope, palpitations Positive for mild ankle edema GI: Positive for ventral hernia; No nausea, vomiting, diarrhea, or abdominal pain GU: Negative; No dysuria, hematuria, or difficulty voiding Musculoskeletal: Negative; no myalgias, joint pain, or weakness Hematologic/Oncologic: Negative; no easy bruising, bleeding Endocrine: Negative; no heat/cold intolerance; no diabetes Neuro: Negative; no changes in balance, headaches Skin: Negative; No rashes or skin lesions Psychiatric: Negative; No behavioral problems, depression Sleep: Negative; No daytime sleepiness, hypersomnolence, bruxism, restless legs, hypnogagnic hallucinations Other comprehensive 14 point system review is negative   Physical Exam BP 156/86  Pulse 70  Ht 5\' 9"  (1.753 m)  Wt 218 lb 6.4 oz (99.066 kg)  BMI 32.24 kg/m2 General: Alert, oriented, no distress.  Skin: normal turgor, no rashes, warm and dry HEENT: Normocephalic, atraumatic. Pupils equal round and reactive to light; sclera anicteric; extraocular muscles intact; Fundi no hemorrhages or exudates.   Nose without nasal septal hypertrophy Mouth/Parynx benign; Mallinpatti scale 3 Neck: No JVD, no carotid bruits; normal carotid upstroke Lungs: clear to ausculatation and percussion; no wheezing or rales Chest wall: without tenderness to palpitation Heart: PMI not displaced, RRR, s1 s2 normal, 2/6 systolic murmur, no diastolic murmur, no rubs, gallops, thrills, or heaves Abdomen: soft, nontender; no hepatosplenomehaly, BS+; abdominal aorta nontender and not dilated by palpation. Back: no CVA tenderness Pulses  2+ Musculoskeletal: full range of motion, normal strength, no joint deformities Extremities: Mild left ankle edema, left greater than right. no clubbing cyanosis  Homan's sign negative  Neurologic: grossly nonfocal; Cranial nerves grossly wnl Psychologic: Normal mood and affect   ECG (independently read by me): Sinus rhythm at 70 beats per minute.  Left bundle branch block.  QTc interval 464 ms.  LABS:  BMET    Component Value Date/Time   NA 140 06/18/2013 0900   K 4.2 06/18/2013 0900   CL 106 06/18/2013 0900   CO2 28 06/18/2013 0900   GLUCOSE 111* 06/18/2013 0900   BUN 18 06/18/2013 0900   CREATININE 1.3 06/18/2013 0900   CALCIUM 9.5 06/18/2013 0900   GFRNONAA 73.05 01/15/2010 1041   GFRAA 75 11/14/2007 1026     Hepatic Function Panel     Component Value Date/Time   PROT 7.7 11/12/2012 1442   ALBUMIN 4.4 11/12/2012 1442   AST 25 11/12/2012 1442   ALT 22 11/12/2012 1442   ALKPHOS 48 11/12/2012 1442   BILITOT 1.0 11/12/2012 1442  BILIDIR 0.1 11/12/2012 1442     CBC    Component Value Date/Time   WBC 7.4 06/18/2013 0900   RBC 5.34 06/18/2013 0900   HGB 16.4 06/18/2013 0900   HCT 50.0 06/18/2013 0900   PLT 117.0* 06/18/2013 0900   MCV 93.8 06/18/2013 0900   MCHC 32.8 06/18/2013 0900   RDW 13.6 06/18/2013 0900   LYMPHSABS 2.9 06/18/2013 0900   MONOABS 0.7 06/18/2013 0900   EOSABS 0.3 06/18/2013 0900   BASOSABS 0.0 06/18/2013 0900     BNP No results found for this basename: probnp    Lipid Panel     Component Value Date/Time   CHOL 199 11/12/2012 1442   TRIG 149.0 11/12/2012 1442   HDL 52.10 11/12/2012 1442   CHOLHDL 4 11/12/2012 1442   VLDL 29.8 11/12/2012 1442   LDLCALC 117* 11/12/2012 1442      RADIOLOGY: No results found.   ASSESSMENT AND PLAN: Mr. Alexandar Weisenberger is an 80 year old gentleman, who is now 15 years status post CABG revascularization surgery and aortic valve replacement with a bioprosthetic aortic valve.  His last catheterization 7 years ago, continued to show patent grafts and his  last nuclear perfusion study remained low risk.  His blood pressure today is elevated at 156/86 on his current dose of lisinopril 5 mg and I recommended that he titrate this to 10 mg.  He also is on atenolol 25 mg.  He'll continue to stay on this present dose.  He does have a history of hyperlipidemia and has been intolerant to statins, but is tolerating Zetia 10 mg.  He tells me Dr. Alain Marion will be rechecking blood work in 2 weeks and I will review.  He does have left bundle branch block.  Presently, he remains stable without anginal symptoms.  His last echo Doppler study was in December 2013.  I will repeat this in 6 months for a two-year followup evaluation.  I also reviewed his carotid studies that he had done in October 2014, which showed mild plaque without significant stenoses.  I will see him in 6 months in followup of his echo Doppler study and further recommendations will be made at that time.   Troy Sine, MD, [facility] 11/04/2013 8:19 AM

## 2013-11-14 ENCOUNTER — Encounter: Payer: Self-pay | Admitting: Internal Medicine

## 2013-11-14 ENCOUNTER — Ambulatory Visit (INDEPENDENT_AMBULATORY_CARE_PROVIDER_SITE_OTHER): Payer: Medicare Other | Admitting: Internal Medicine

## 2013-11-14 VITALS — BP 150/72 | HR 80 | Temp 98.2°F | Resp 16 | Ht 70.0 in | Wt 215.0 lb

## 2013-11-14 DIAGNOSIS — Z0000 Encounter for general adult medical examination without abnormal findings: Secondary | ICD-10-CM

## 2013-11-14 DIAGNOSIS — Z23 Encounter for immunization: Secondary | ICD-10-CM | POA: Diagnosis not present

## 2013-11-14 DIAGNOSIS — I10 Essential (primary) hypertension: Secondary | ICD-10-CM

## 2013-11-14 DIAGNOSIS — N320 Bladder-neck obstruction: Secondary | ICD-10-CM

## 2013-11-14 DIAGNOSIS — M545 Low back pain, unspecified: Secondary | ICD-10-CM

## 2013-11-14 DIAGNOSIS — I2510 Atherosclerotic heart disease of native coronary artery without angina pectoris: Secondary | ICD-10-CM | POA: Diagnosis not present

## 2013-11-14 DIAGNOSIS — D696 Thrombocytopenia, unspecified: Secondary | ICD-10-CM

## 2013-11-14 DIAGNOSIS — J449 Chronic obstructive pulmonary disease, unspecified: Secondary | ICD-10-CM

## 2013-11-14 DIAGNOSIS — Z952 Presence of prosthetic heart valve: Secondary | ICD-10-CM

## 2013-11-14 NOTE — Assessment & Plan Note (Signed)
Continue with current prescription therapy as reflected on the Med list.  

## 2013-11-14 NOTE — Assessment & Plan Note (Signed)
Doing well 

## 2013-11-14 NOTE — Assessment & Plan Note (Signed)
PSA

## 2013-11-14 NOTE — Progress Notes (Signed)
Pre visit review using our clinic review tool, if applicable. No additional management support is needed unless otherwise documented below in the visit note. 

## 2013-11-14 NOTE — Progress Notes (Signed)
   Subjective:    HPI  The patient is here for a wellness exam. The patient has been doing well overall without major physical or psychological issues going on lately.  The patient presents for a follow-up of  chronic hypertension, chronic dyslipidemia, CAD controlled with medicines. He bought himself a motor home last year...   Wt Readings from Last 3 Encounters:  11/14/13 215 lb (97.523 kg)  10/31/13 218 lb 6.4 oz (99.066 kg)  06/18/13 217 lb (98.431 kg)    BP Readings from Last 3 Encounters:  11/14/13 150/72  10/31/13 156/86  06/18/13 150/80     Review of Systems  Constitutional: Negative for appetite change, fatigue and unexpected weight change.  HENT: Negative for congestion, nosebleeds, sneezing, sore throat and trouble swallowing.   Eyes: Negative for itching and visual disturbance.  Respiratory: Negative for cough.   Cardiovascular: Negative for chest pain, palpitations and leg swelling.  Gastrointestinal: Negative for nausea, diarrhea, blood in stool and abdominal distention.  Genitourinary: Negative for frequency and hematuria.  Musculoskeletal: Negative for back pain, gait problem, joint swelling and neck pain.  Skin: Negative for rash.  Neurological: Negative for dizziness, tremors, speech difficulty and weakness.  Psychiatric/Behavioral: Negative for suicidal ideas, sleep disturbance, dysphoric mood and agitation. The patient is not nervous/anxious.        Objective:   Physical Exam  Constitutional: He is oriented to person, place, and time. He appears well-developed and well-nourished. No distress.  HENT:  Mouth/Throat: Oropharynx is clear and moist.  Eyes: Conjunctivae are normal. Pupils are equal, round, and reactive to light.  Neck: Normal range of motion. No JVD present. No thyromegaly present.  Cardiovascular: Normal rate, regular rhythm, normal heart sounds and intact distal pulses.  Exam reveals no gallop and no friction rub.   No murmur  heard. Pulmonary/Chest: Effort normal and breath sounds normal. No respiratory distress. He has no wheezes. He has no rales. He exhibits no tenderness.  Abdominal: Soft. Bowel sounds are normal. He exhibits no distension and no mass. There is no tenderness. There is no rebound and no guarding.  Musculoskeletal: Normal range of motion. He exhibits no edema and no tenderness.  Lymphadenopathy:    He has no cervical adenopathy.  Neurological: He is alert and oriented to person, place, and time. He has normal reflexes. No cranial nerve deficit. He exhibits normal muscle tone. Coordination normal.  Skin: Skin is warm and dry. No rash noted.  Psychiatric: He has a normal mood and affect. His behavior is normal. Judgment and thought content normal.    Lab Results  Component Value Date   WBC 7.4 06/18/2013   HGB 16.4 06/18/2013   HCT 50.0 06/18/2013   PLT 117.0* 06/18/2013   GLUCOSE 111* 06/18/2013   CHOL 199 11/12/2012   TRIG 149.0 11/12/2012   HDL 52.10 11/12/2012   LDLDIRECT 136.2 08/01/2007   LDLCALC 117* 11/12/2012   ALT 22 11/12/2012   AST 25 11/12/2012   NA 140 06/18/2013   K 4.2 06/18/2013   CL 106 06/18/2013   CREATININE 1.3 06/18/2013   BUN 18 06/18/2013   CO2 28 06/18/2013   TSH 2.21 11/12/2012   PSA 2.36 11/12/2012   INR 1.0 01/08/2007   HGBA1C 5.9 08/31/2011             Assessment & Plan:

## 2013-11-14 NOTE — Assessment & Plan Note (Signed)
Labs

## 2013-11-14 NOTE — Assessment & Plan Note (Signed)
Hearing: intact to whispered voice  Visual acuity: grossly normal, performs annual eye exam  ADLs: capable  Fall risk: none  Home safety: good  Cognitive evaluation: intact to orientation, naming, recall and repetition  EOL planning: adv directives, full code/ I agree  I have personally reviewed and have noted  1. The patient's medical and social history  2. Their use of alcohol, tobacco or illicit drugs  3. Their current medications and supplements  4. The patient's functional ability including ADL's, fall risks, home safety risks and hearing or visual impairment.  5. Diet and physical activities  6. Evidence for depression or mood disorders    Today patient counseled on age appropriate routine health concerns for screening and prevention, each reviewed and up to date or declined. Immunizations reviewed and up to date or declined. Labs ordered and reviewed. Risk factors for depression reviewed and negative. Hearing function and visual acuity are intact. ADLs screened and addressed as needed. Functional ability and level of safety reviewed and appropriate. Education, counseling and referrals performed based on assessed risks today. Patient provided with a copy of personalized plan for preventive services.

## 2013-11-18 ENCOUNTER — Other Ambulatory Visit (INDEPENDENT_AMBULATORY_CARE_PROVIDER_SITE_OTHER): Payer: Medicare Other

## 2013-11-18 ENCOUNTER — Telehealth: Payer: Self-pay | Admitting: Internal Medicine

## 2013-11-18 DIAGNOSIS — Z0000 Encounter for general adult medical examination without abnormal findings: Secondary | ICD-10-CM | POA: Diagnosis not present

## 2013-11-18 DIAGNOSIS — I10 Essential (primary) hypertension: Secondary | ICD-10-CM

## 2013-11-18 DIAGNOSIS — I2510 Atherosclerotic heart disease of native coronary artery without angina pectoris: Secondary | ICD-10-CM | POA: Diagnosis not present

## 2013-11-18 DIAGNOSIS — D696 Thrombocytopenia, unspecified: Secondary | ICD-10-CM | POA: Diagnosis not present

## 2013-11-18 DIAGNOSIS — N320 Bladder-neck obstruction: Secondary | ICD-10-CM

## 2013-11-18 DIAGNOSIS — M545 Low back pain, unspecified: Secondary | ICD-10-CM

## 2013-11-18 DIAGNOSIS — Z954 Presence of other heart-valve replacement: Secondary | ICD-10-CM | POA: Diagnosis not present

## 2013-11-18 DIAGNOSIS — Z952 Presence of prosthetic heart valve: Secondary | ICD-10-CM

## 2013-11-18 LAB — CBC WITH DIFFERENTIAL/PLATELET
BASOS PCT: 0.3 % (ref 0.0–3.0)
Basophils Absolute: 0 10*3/uL (ref 0.0–0.1)
Eosinophils Absolute: 0.3 10*3/uL (ref 0.0–0.7)
Eosinophils Relative: 3.1 % (ref 0.0–5.0)
HCT: 49.1 % (ref 39.0–52.0)
Hemoglobin: 16.5 g/dL (ref 13.0–17.0)
LYMPHS PCT: 41.3 % (ref 12.0–46.0)
Lymphs Abs: 3.8 10*3/uL (ref 0.7–4.0)
MCHC: 33.7 g/dL (ref 30.0–36.0)
MCV: 92 fl (ref 78.0–100.0)
MONO ABS: 0.7 10*3/uL (ref 0.1–1.0)
Monocytes Relative: 7.4 % (ref 3.0–12.0)
NEUTROS PCT: 47.9 % (ref 43.0–77.0)
Neutro Abs: 4.5 10*3/uL (ref 1.4–7.7)
Platelets: 127 10*3/uL — ABNORMAL LOW (ref 150.0–400.0)
RBC: 5.34 Mil/uL (ref 4.22–5.81)
RDW: 14.1 % (ref 11.5–15.5)
WBC: 9.3 10*3/uL (ref 4.0–10.5)

## 2013-11-18 LAB — BASIC METABOLIC PANEL
BUN: 17 mg/dL (ref 6–23)
CALCIUM: 9.5 mg/dL (ref 8.4–10.5)
CO2: 27 mEq/L (ref 19–32)
Chloride: 108 mEq/L (ref 96–112)
Creatinine, Ser: 1.4 mg/dL (ref 0.4–1.5)
GFR: 50.8 mL/min — AB (ref >60.00)
GLUCOSE: 136 mg/dL — AB (ref 70–99)
Potassium: 4.6 mEq/L (ref 3.5–5.1)
SODIUM: 141 meq/L (ref 135–145)

## 2013-11-18 LAB — LIPID PANEL
CHOLESTEROL: 178 mg/dL (ref 0–200)
HDL: 48.6 mg/dL (ref >39.00)
LDL Cholesterol: 117 mg/dL — ABNORMAL HIGH (ref 0–99)
NonHDL: 129.4
TRIGLYCERIDES: 63 mg/dL (ref 0.0–149.0)
Total CHOL/HDL Ratio: 4
VLDL: 12.6 mg/dL (ref 0.0–40.0)

## 2013-11-18 LAB — URINALYSIS
HGB URINE DIPSTICK: NEGATIVE
KETONES UR: NEGATIVE
Leukocytes, UA: NEGATIVE
Nitrite: NEGATIVE
Specific Gravity, Urine: >=1.03 — AB (ref 1.000–1.030)
Total Protein, Urine: NEGATIVE
URINE GLUCOSE: NEGATIVE
Urobilinogen, UA: 0.2 (ref 0.0–1.0)
pH: 5.5 (ref 5.0–8.0)

## 2013-11-18 LAB — HEPATIC FUNCTION PANEL
ALBUMIN: 4.1 g/dL (ref 3.5–5.2)
ALT: 18 U/L (ref 0–53)
AST: 23 U/L (ref 0–37)
Alkaline Phosphatase: 48 U/L (ref 39–117)
BILIRUBIN TOTAL: 0.9 mg/dL (ref 0.2–1.2)
Bilirubin, Direct: 0.2 mg/dL (ref 0.0–0.3)
TOTAL PROTEIN: 7.4 g/dL (ref 6.0–8.3)

## 2013-11-18 LAB — PSA: PSA: 2.72 ng/mL (ref 0.10–4.00)

## 2013-11-18 LAB — TSH: TSH: 2.47 u[IU]/mL (ref 0.35–4.50)

## 2013-11-18 NOTE — Telephone Encounter (Signed)
Relevant patient education assigned to patient using Emmi. ° °

## 2013-12-06 ENCOUNTER — Other Ambulatory Visit: Payer: Self-pay | Admitting: Internal Medicine

## 2014-01-31 DIAGNOSIS — H524 Presbyopia: Secondary | ICD-10-CM | POA: Diagnosis not present

## 2014-01-31 DIAGNOSIS — H2510 Age-related nuclear cataract, unspecified eye: Secondary | ICD-10-CM | POA: Diagnosis not present

## 2014-01-31 DIAGNOSIS — H5200 Hypermetropia, unspecified eye: Secondary | ICD-10-CM | POA: Diagnosis not present

## 2014-03-18 ENCOUNTER — Ambulatory Visit: Payer: Medicare Other | Admitting: Internal Medicine

## 2014-03-18 DIAGNOSIS — Z0289 Encounter for other administrative examinations: Secondary | ICD-10-CM

## 2014-04-09 ENCOUNTER — Ambulatory Visit (INDEPENDENT_AMBULATORY_CARE_PROVIDER_SITE_OTHER): Payer: Medicare Other | Admitting: Internal Medicine

## 2014-04-09 ENCOUNTER — Encounter: Payer: Self-pay | Admitting: Internal Medicine

## 2014-04-09 VITALS — BP 130/90 | HR 71 | Temp 98.8°F | Wt 216.0 lb

## 2014-04-09 DIAGNOSIS — R059 Cough, unspecified: Secondary | ICD-10-CM

## 2014-04-09 DIAGNOSIS — Z954 Presence of other heart-valve replacement: Secondary | ICD-10-CM

## 2014-04-09 DIAGNOSIS — I10 Essential (primary) hypertension: Secondary | ICD-10-CM | POA: Diagnosis not present

## 2014-04-09 DIAGNOSIS — E785 Hyperlipidemia, unspecified: Secondary | ICD-10-CM

## 2014-04-09 DIAGNOSIS — R05 Cough: Secondary | ICD-10-CM | POA: Diagnosis not present

## 2014-04-09 DIAGNOSIS — I2510 Atherosclerotic heart disease of native coronary artery without angina pectoris: Secondary | ICD-10-CM

## 2014-04-09 DIAGNOSIS — R739 Hyperglycemia, unspecified: Secondary | ICD-10-CM

## 2014-04-09 DIAGNOSIS — Z952 Presence of prosthetic heart valve: Secondary | ICD-10-CM

## 2014-04-09 HISTORY — DX: Cough, unspecified: R05.9

## 2014-04-09 MED ORDER — LISINOPRIL 10 MG PO TABS: 10.0000 mg | ORAL_TABLET | Freq: Every day | ORAL | Status: DC

## 2014-04-09 MED ORDER — EZETIMIBE 10 MG PO TABS
10.0000 mg | ORAL_TABLET | Freq: Every day | ORAL | Status: DC
Start: 2014-04-09 — End: 2014-04-09

## 2014-04-09 MED ORDER — PROMETHAZINE-CODEINE 6.25-10 MG/5ML PO SYRP: 5.0000 mL | ORAL_SOLUTION | ORAL | Status: DC | PRN

## 2014-04-09 MED ORDER — ATENOLOL 25 MG PO TABS
25.0000 mg | ORAL_TABLET | Freq: Every day | ORAL | Status: DC
Start: 2014-04-09 — End: 2015-04-21

## 2014-04-09 MED ORDER — EZETIMIBE 10 MG PO TABS: 10.0000 mg | ORAL_TABLET | Freq: Every day | ORAL | Status: DC

## 2014-04-09 NOTE — Assessment & Plan Note (Signed)
Doing well 

## 2014-04-09 NOTE — Assessment & Plan Note (Signed)
On Zetia 

## 2014-04-09 NOTE — Progress Notes (Signed)
Pre visit review using our clinic review tool, if applicable. No additional management support is needed unless otherwise documented below in the visit note. 

## 2014-04-09 NOTE — Progress Notes (Signed)
   Subjective:    HPI  C/o dry cough x 3 wks  The patient presents for a follow-up of  chronic hypertension, chronic dyslipidemia, CAD controlled with medicines. 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  HENT: Negative for congestion, nosebleeds, sneezing, sore throat and trouble swallowing.   Eyes: Negative for itching and visual disturbance.  Respiratory: Negative for cough.   Cardiovascular: Negative for chest pain, palpitations and leg swelling.  Gastrointestinal: Negative for nausea, diarrhea, blood in stool and abdominal distention.  Genitourinary: Negative for frequency and hematuria.  Musculoskeletal: Negative for back pain, joint swelling, gait problem and neck pain.  Skin: Negative for rash.  Neurological: Negative for dizziness, tremors, speech difficulty and weakness.  Psychiatric/Behavioral: Negative for suicidal ideas, sleep disturbance, dysphoric mood and agitation. The patient is not nervous/anxious.        Objective:   Physical Exam  Constitutional: He is oriented to person, place, and time. He appears well-developed. No distress.  NAD  HENT:  Mouth/Throat: Oropharynx is clear and moist.  Eyes: Conjunctivae are normal. Pupils are equal, round, and reactive to light.  Neck: Normal range of motion. No JVD present. No thyromegaly present.  Cardiovascular: Normal rate, regular rhythm, normal heart sounds and intact distal pulses.  Exam reveals no gallop and no friction rub.   No murmur heard. Pulmonary/Chest: Effort normal and breath sounds normal. No respiratory distress. He has no wheezes.  He has no rales. He exhibits no tenderness.  Abdominal: Soft. Bowel sounds are normal. He exhibits no distension and no mass. There is no tenderness. There is no rebound and no guarding.  Musculoskeletal: Normal range of motion. He exhibits no edema or tenderness.  Lymphadenopathy:    He has no cervical adenopathy.  Neurological: He is alert and oriented to person, place, and time. He has normal reflexes. No cranial nerve deficit. He exhibits normal muscle tone. He displays a negative Romberg sign. Coordination and gait normal.  No meningeal signs  Skin: Skin is warm and dry. No rash noted.  Psychiatric: He has a normal mood and affect. His behavior is normal. Judgment and thought content normal.  Obese  Lab Results  Component Value Date   WBC 9.3 11/18/2013   HGB 16.5 11/18/2013   HCT 49.1 11/18/2013   PLT 127.0* 11/18/2013   GLUCOSE 136* 11/18/2013   CHOL 178 11/18/2013   TRIG 63.0 11/18/2013   HDL 48.60 11/18/2013   LDLDIRECT 136.2 08/01/2007   LDLCALC 117* 11/18/2013   ALT 18 11/18/2013   AST 23 11/18/2013   NA 141 11/18/2013   K 4.6 11/18/2013   CL 108 11/18/2013   CREATININE 1.4 11/18/2013   BUN 17 11/18/2013   CO2 27 11/18/2013   TSH 2.47 11/18/2013   PSA 2.72 11/18/2013   INR 1.0 01/08/2007   HGBA1C 5.9 08/31/2011             Assessment & Plan:

## 2014-04-09 NOTE — Assessment & Plan Note (Signed)
Continue with current prescription therapy as reflected on the Med list. BP Readings from Last 3 Encounters:  04/09/14 130/90  11/14/13 150/72  10/31/13 156/86

## 2014-04-09 NOTE — Assessment & Plan Note (Signed)
Continue with current prescription therapy as reflected on the Med list.  

## 2014-04-09 NOTE — Assessment & Plan Note (Signed)
11/15 likely a URI ACE(?) discussed  Prom-cod syr prn

## 2014-07-10 ENCOUNTER — Ambulatory Visit: Payer: Medicare Other | Admitting: Internal Medicine

## 2014-07-17 ENCOUNTER — Other Ambulatory Visit (INDEPENDENT_AMBULATORY_CARE_PROVIDER_SITE_OTHER): Payer: Medicare Other

## 2014-07-17 ENCOUNTER — Encounter: Payer: Self-pay | Admitting: Internal Medicine

## 2014-07-17 ENCOUNTER — Ambulatory Visit (INDEPENDENT_AMBULATORY_CARE_PROVIDER_SITE_OTHER): Payer: Medicare Other | Admitting: Internal Medicine

## 2014-07-17 VITALS — BP 146/90 | HR 74 | Temp 98.5°F | Wt 211.0 lb

## 2014-07-17 DIAGNOSIS — Z954 Presence of other heart-valve replacement: Secondary | ICD-10-CM | POA: Diagnosis not present

## 2014-07-17 DIAGNOSIS — I2510 Atherosclerotic heart disease of native coronary artery without angina pectoris: Secondary | ICD-10-CM

## 2014-07-17 DIAGNOSIS — I10 Essential (primary) hypertension: Secondary | ICD-10-CM

## 2014-07-17 DIAGNOSIS — K5901 Slow transit constipation: Secondary | ICD-10-CM | POA: Diagnosis not present

## 2014-07-17 DIAGNOSIS — E785 Hyperlipidemia, unspecified: Secondary | ICD-10-CM

## 2014-07-17 DIAGNOSIS — Z952 Presence of prosthetic heart valve: Secondary | ICD-10-CM

## 2014-07-17 DIAGNOSIS — K5900 Constipation, unspecified: Secondary | ICD-10-CM | POA: Insufficient documentation

## 2014-07-17 DIAGNOSIS — M5440 Lumbago with sciatica, unspecified side: Secondary | ICD-10-CM | POA: Diagnosis not present

## 2014-07-17 DIAGNOSIS — R739 Hyperglycemia, unspecified: Secondary | ICD-10-CM

## 2014-07-17 LAB — CBC WITH DIFFERENTIAL/PLATELET
BASOS PCT: 0.5 % (ref 0.0–3.0)
Basophils Absolute: 0 10*3/uL (ref 0.0–0.1)
Eosinophils Absolute: 0.2 10*3/uL (ref 0.0–0.7)
Eosinophils Relative: 4 % (ref 0.0–5.0)
HEMATOCRIT: 48 % (ref 39.0–52.0)
HEMOGLOBIN: 16.5 g/dL (ref 13.0–17.0)
LYMPHS ABS: 1.9 10*3/uL (ref 0.7–4.0)
Lymphocytes Relative: 31.6 % (ref 12.0–46.0)
MCHC: 34.3 g/dL (ref 30.0–36.0)
MCV: 89.2 fl (ref 78.0–100.0)
MONO ABS: 0.5 10*3/uL (ref 0.1–1.0)
MONOS PCT: 8.9 % (ref 3.0–12.0)
NEUTROS ABS: 3.4 10*3/uL (ref 1.4–7.7)
Neutrophils Relative %: 55 % (ref 43.0–77.0)
Platelets: 115 10*3/uL — ABNORMAL LOW (ref 150.0–400.0)
RBC: 5.39 Mil/uL (ref 4.22–5.81)
RDW: 14.5 % (ref 11.5–15.5)
WBC: 6.2 10*3/uL (ref 4.0–10.5)

## 2014-07-17 LAB — LIPID PANEL
Cholesterol: 167 mg/dL (ref 0–200)
HDL: 52.6 mg/dL (ref >39.00)
LDL Cholesterol: 103 mg/dL — ABNORMAL HIGH (ref 0–99)
NonHDL: 114.4
Total CHOL/HDL Ratio: 3
Triglycerides: 55 mg/dL (ref 0.0–149.0)
VLDL: 11 mg/dL (ref 0.0–40.0)

## 2014-07-17 LAB — HEPATIC FUNCTION PANEL
ALK PHOS: 48 U/L (ref 39–117)
ALT: 14 U/L (ref 0–53)
AST: 17 U/L (ref 0–37)
Albumin: 4.3 g/dL (ref 3.5–5.2)
BILIRUBIN DIRECT: 0.1 mg/dL (ref 0.0–0.3)
Total Bilirubin: 0.6 mg/dL (ref 0.2–1.2)
Total Protein: 7.7 g/dL (ref 6.0–8.3)

## 2014-07-17 LAB — BASIC METABOLIC PANEL
BUN: 19 mg/dL (ref 6–23)
CO2: 30 mEq/L (ref 19–32)
CREATININE: 1.29 mg/dL (ref 0.40–1.50)
Calcium: 9.6 mg/dL (ref 8.4–10.5)
Chloride: 107 mEq/L (ref 96–112)
GFR: 55.75 mL/min — ABNORMAL LOW (ref >60.00)
Glucose, Bld: 120 mg/dL — ABNORMAL HIGH (ref 70–99)
POTASSIUM: 4.2 meq/L (ref 3.5–5.1)
Sodium: 141 mEq/L (ref 135–145)

## 2014-07-17 LAB — HEMOGLOBIN A1C: Hgb A1c MFr Bld: 6 % (ref 4.6–6.5)

## 2014-07-17 MED ORDER — LUBIPROSTONE 24 MCG PO CAPS: 24.0000 ug | ORAL_CAPSULE | Freq: Two times a day (BID) | ORAL | Status: DC

## 2014-07-17 NOTE — Assessment & Plan Note (Signed)
H/o CABG No angina Statin intolerant

## 2014-07-17 NOTE — Assessment & Plan Note (Signed)
Cont Zetia Rx

## 2014-07-17 NOTE — Progress Notes (Signed)
Pre visit review using our clinic review tool, if applicable. No additional management support is needed unless otherwise documented below in the visit note. 

## 2014-07-17 NOTE — Progress Notes (Signed)
   Subjective:    HPI    The patient presents for a follow-up of  chronic hypertension, chronic dyslipidemia, CAD controlled with medicines. 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  HENT: Negative for congestion, nosebleeds, sneezing, sore throat and trouble swallowing.   Eyes: Negative for itching and visual disturbance.  Respiratory: Negative for cough.   Cardiovascular: Negative for chest pain, palpitations and leg swelling.  Gastrointestinal: Negative for nausea, diarrhea, blood in stool and abdominal distention.  Genitourinary: Negative for frequency and hematuria.  Musculoskeletal: Negative for back pain, joint swelling, gait problem and neck pain.  Skin: Negative for rash.  Neurological: Negative for dizziness, tremors, speech difficulty and weakness.  Psychiatric/Behavioral: Negative for suicidal ideas, sleep disturbance, dysphoric mood and agitation. The patient is not nervous/anxious.        Objective:   Physical Exam  Constitutional: He is oriented to person, place, and time. He appears well-developed. No distress.  NAD  HENT:  Mouth/Throat: Oropharynx is clear and moist.  Eyes: Conjunctivae are normal. Pupils are equal, round, and reactive to light.  Neck: Normal range of motion. No JVD present. No thyromegaly present.  Cardiovascular: Normal rate, regular rhythm, normal heart sounds and intact distal pulses.  Exam reveals no gallop and no friction rub.   No murmur heard. Pulmonary/Chest: Effort normal and breath sounds normal. No respiratory distress. He has no  wheezes. He has no rales. He exhibits no tenderness.  Abdominal: Soft. Bowel sounds are normal. He exhibits no distension and no mass. There is no tenderness. There is no rebound and no guarding.  Musculoskeletal: Normal range of motion. He exhibits no edema or tenderness.  Lymphadenopathy:    He has no cervical adenopathy.  Neurological: He is alert and oriented to person, place, and time. He has normal reflexes. No cranial nerve deficit. He exhibits normal muscle tone. He displays a negative Romberg sign. Coordination and gait normal.  No meningeal signs  Skin: Skin is warm and dry. No rash noted.  Psychiatric: He has a normal mood and affect. His behavior is normal. Judgment and thought content normal.  Obese  Lab Results  Component Value Date   WBC 9.3 11/18/2013   HGB 16.5 11/18/2013   HCT 49.1 11/18/2013   PLT 127.0* 11/18/2013   GLUCOSE 136* 11/18/2013   CHOL 178 11/18/2013   TRIG 63.0 11/18/2013   HDL 48.60 11/18/2013   LDLDIRECT 136.2 08/01/2007   LDLCALC 117* 11/18/2013   ALT 18 11/18/2013   AST 23 11/18/2013   NA 141 11/18/2013   K 4.6 11/18/2013   CL 108 11/18/2013   CREATININE 1.4 11/18/2013   BUN 17 11/18/2013   CO2 27 11/18/2013   TSH 2.47 11/18/2013   PSA 2.72 11/18/2013   INR 1.0 01/08/2007   HGBA1C 5.9 08/31/2011             Assessment & Plan:

## 2014-07-17 NOTE — Assessment & Plan Note (Signed)
Amitiza prn GI ref if not well

## 2014-07-17 NOTE — Assessment & Plan Note (Signed)
Lisinopril, Atenolol and Lasix -- cont Rx

## 2014-07-17 NOTE — Assessment & Plan Note (Signed)
Chronic and recurrent Tylenol prn

## 2014-10-06 ENCOUNTER — Other Ambulatory Visit: Payer: Self-pay | Admitting: Internal Medicine

## 2014-10-06 NOTE — Telephone Encounter (Signed)
Ok to Rf? Dose discrepancy.

## 2014-10-09 NOTE — Telephone Encounter (Signed)
Pt called- he states he has been taking Lisinopril 5 mg. Rf sent.

## 2014-12-02 ENCOUNTER — Ambulatory Visit (INDEPENDENT_AMBULATORY_CARE_PROVIDER_SITE_OTHER): Payer: Medicare Other | Admitting: Internal Medicine

## 2014-12-02 ENCOUNTER — Encounter: Payer: Self-pay | Admitting: Internal Medicine

## 2014-12-02 ENCOUNTER — Other Ambulatory Visit (INDEPENDENT_AMBULATORY_CARE_PROVIDER_SITE_OTHER): Payer: Medicare Other

## 2014-12-02 VITALS — BP 120/70 | HR 70 | Ht 70.0 in | Wt 204.0 lb

## 2014-12-02 DIAGNOSIS — L570 Actinic keratosis: Secondary | ICD-10-CM | POA: Diagnosis not present

## 2014-12-02 DIAGNOSIS — M722 Plantar fascial fibromatosis: Secondary | ICD-10-CM

## 2014-12-02 DIAGNOSIS — D582 Other hemoglobinopathies: Secondary | ICD-10-CM

## 2014-12-02 DIAGNOSIS — I2510 Atherosclerotic heart disease of native coronary artery without angina pectoris: Secondary | ICD-10-CM | POA: Diagnosis not present

## 2014-12-02 DIAGNOSIS — R7302 Impaired glucose tolerance (oral): Secondary | ICD-10-CM

## 2014-12-02 DIAGNOSIS — Z952 Presence of prosthetic heart valve: Secondary | ICD-10-CM

## 2014-12-02 DIAGNOSIS — Z0000 Encounter for general adult medical examination without abnormal findings: Secondary | ICD-10-CM | POA: Diagnosis not present

## 2014-12-02 DIAGNOSIS — N320 Bladder-neck obstruction: Secondary | ICD-10-CM | POA: Diagnosis not present

## 2014-12-02 DIAGNOSIS — I10 Essential (primary) hypertension: Secondary | ICD-10-CM

## 2014-12-02 DIAGNOSIS — E785 Hyperlipidemia, unspecified: Secondary | ICD-10-CM

## 2014-12-02 DIAGNOSIS — Z954 Presence of other heart-valve replacement: Secondary | ICD-10-CM

## 2014-12-02 LAB — URINALYSIS
Bilirubin Urine: NEGATIVE
Ketones, ur: NEGATIVE
Leukocytes, UA: NEGATIVE
Nitrite: NEGATIVE
PH: 6 (ref 5.0–8.0)
Specific Gravity, Urine: >=1.03 — AB (ref 1.000–1.030)
Total Protein, Urine: NEGATIVE
URINE GLUCOSE: NEGATIVE
Urobilinogen, UA: 0.2 (ref 0.0–1.0)

## 2014-12-02 LAB — BASIC METABOLIC PANEL
BUN: 17 mg/dL (ref 6–23)
CO2: 28 mEq/L (ref 19–32)
Calcium: 9.7 mg/dL (ref 8.4–10.5)
Chloride: 104 mEq/L (ref 96–112)
Creatinine, Ser: 1.3 mg/dL (ref 0.40–1.50)
GFR: 55.21 mL/min — AB (ref >60.00)
Glucose, Bld: 103 mg/dL — ABNORMAL HIGH (ref 70–99)
POTASSIUM: 4 meq/L (ref 3.5–5.1)
Sodium: 141 mEq/L (ref 135–145)

## 2014-12-02 LAB — CBC WITH DIFFERENTIAL/PLATELET
Basophils Absolute: 0 10*3/uL (ref 0.0–0.1)
Basophils Relative: 0.1 % (ref 0.0–3.0)
Eosinophils Absolute: 0.3 10*3/uL (ref 0.0–0.7)
Eosinophils Relative: 3.1 % (ref 0.0–5.0)
HCT: 50.4 % (ref 39.0–52.0)
Hemoglobin: 17 g/dL (ref 13.0–17.0)
LYMPHS ABS: 2.5 10*3/uL (ref 0.7–4.0)
LYMPHS PCT: 30.4 % (ref 12.0–46.0)
MCHC: 33.8 g/dL (ref 30.0–36.0)
MCV: 90.7 fl (ref 78.0–100.0)
MONOS PCT: 8.4 % (ref 3.0–12.0)
Monocytes Absolute: 0.7 10*3/uL (ref 0.1–1.0)
Neutro Abs: 4.8 10*3/uL (ref 1.4–7.7)
Neutrophils Relative %: 58 % (ref 43.0–77.0)
Platelets: 122 10*3/uL — ABNORMAL LOW (ref 150.0–400.0)
RBC: 5.56 Mil/uL (ref 4.22–5.81)
RDW: 14.4 % (ref 11.5–15.5)
WBC: 8.3 10*3/uL (ref 4.0–10.5)

## 2014-12-02 LAB — LIPID PANEL
CHOL/HDL RATIO: 4
CHOLESTEROL: 192 mg/dL (ref 0–200)
HDL: 50.5 mg/dL (ref >39.00)
LDL Cholesterol: 120 mg/dL — ABNORMAL HIGH (ref 0–99)
NONHDL: 141.5
TRIGLYCERIDES: 106 mg/dL (ref 0.0–149.0)
VLDL: 21.2 mg/dL (ref 0.0–40.0)

## 2014-12-02 LAB — HEPATIC FUNCTION PANEL
ALK PHOS: 49 U/L (ref 39–117)
ALT: 12 U/L (ref 0–53)
AST: 15 U/L (ref 0–37)
Albumin: 4.4 g/dL (ref 3.5–5.2)
BILIRUBIN DIRECT: 0.2 mg/dL (ref 0.0–0.3)
BILIRUBIN TOTAL: 0.9 mg/dL (ref 0.2–1.2)
Total Protein: 7.6 g/dL (ref 6.0–8.3)

## 2014-12-02 LAB — PSA: PSA: 3.97 ng/mL (ref 0.10–4.00)

## 2014-12-02 LAB — TSH: TSH: 2.58 u[IU]/mL (ref 0.35–4.50)

## 2014-12-02 LAB — HEMOGLOBIN A1C: Hgb A1c MFr Bld: 5.8 % (ref 4.6–6.5)

## 2014-12-02 MED ORDER — VITAMIN D 1000 UNITS PO TABS: 1000.0000 [IU] | ORAL_TABLET | Freq: Every day | ORAL | Status: AC

## 2014-12-02 NOTE — Assessment & Plan Note (Signed)
New shoes Ice

## 2014-12-02 NOTE — Assessment & Plan Note (Signed)
See procedure 

## 2014-12-02 NOTE — Assessment & Plan Note (Signed)
Here for medicare wellness/physical  Diet: heart healthy  Physical activity: not sedentary  Depression/mood screen: negative  Hearing: intact to whispered voice  Visual acuity: grossly normal, performs annual eye exam  ADLs: capable  Fall risk: none  Home safety: good  Cognitive evaluation: intact to orientation, naming, recall and repetition  EOL planning: adv directives, full code/ I agree  I have personally reviewed and have noted  1. The patient's medical and social history  2. Their use of alcohol, tobacco or illicit drugs  3. Their current medications and supplements  4. The patient's functional ability including ADL's, fall risks, home safety risks and hearing or visual impairment.  5. Diet and physical activities  6. Evidence for depression or mood disorders 7. Physician roster reviewed    Today patient counseled on age appropriate routine health concerns for screening and prevention, each reviewed and up to date or declined. Immunizations reviewed and up to date or declined. Labs ordered and reviewed. Risk factors for depression reviewed and negative. Hearing function and visual acuity are intact. ADLs screened and addressed as needed. Functional ability and level of safety reviewed and appropriate. Education, counseling and referrals performed based on assessed risks today. Patient provided with a copy of personalized plan for preventive services.

## 2014-12-02 NOTE — Assessment & Plan Note (Signed)
States BP is nl at home Lisinopril, Atenolol and Lasix -

## 2014-12-02 NOTE — Assessment & Plan Note (Signed)
Labs

## 2014-12-02 NOTE — Progress Notes (Signed)
Pre visit review using our clinic review tool, if applicable. No additional management support is needed unless otherwise documented below in the visit note. 

## 2014-12-02 NOTE — Assessment & Plan Note (Signed)
Doing well 

## 2014-12-02 NOTE — Patient Instructions (Addendum)
Preventive Care for Adults A healthy lifestyle and preventive care can promote health and wellness. Preventive health guidelines for men include the following key practices:  A routine yearly physical is a good way to check with your health care provider about your health and preventative screening. It is a chance to share any concerns and updates on your health and to receive a thorough exam.  Visit your dentist for a routine exam and preventative care every 6 months. Brush your teeth twice a day and floss once a day. Good oral hygiene prevents tooth decay and gum disease.  The frequency of eye exams is based on your age, health, family medical history, use of contact lenses, and other factors. Follow your health care provider's recommendations for frequency of eye exams.  Eat a healthy diet. Foods such as vegetables, fruits, whole grains, low-fat dairy products, and lean protein foods contain the nutrients you need without too many calories. Decrease your intake of foods high in solid fats, added sugars, and salt. Eat the right amount of calories for you.Get information about a proper diet from your health care provider, if necessary.  Regular physical exercise is one of the most important things you can do for your health. Most adults should get at least 150 minutes of moderate-intensity exercise (any activity that increases your heart rate and causes you to sweat) each week. In addition, most adults need muscle-strengthening exercises on 2 or more days a week.  Maintain a healthy weight. The body mass index (BMI) is a screening tool to identify possible weight problems. It provides an estimate of body fat based on height and weight. Your health care provider can find your BMI and can help you achieve or maintain a healthy weight.For adults 20 years and older:  A BMI below 18.5 is considered underweight.  A BMI of 18.5 to 24.9 is normal.  A BMI of 25 to 29.9 is considered overweight.  A BMI  of 30 and above is considered obese.  Maintain normal blood lipids and cholesterol levels by exercising and minimizing your intake of saturated fat. Eat a balanced diet with plenty of fruit and vegetables. Blood tests for lipids and cholesterol should begin at age 50 and be repeated every 5 years. If your lipid or cholesterol levels are high, you are over 50, or you are at high risk for heart disease, you may need your cholesterol levels checked more frequently.Ongoing high lipid and cholesterol levels should be treated with medicines if diet and exercise are not working.  If you smoke, find out from your health care provider how to quit. If you do not use tobacco, do not start.  Lung cancer screening is recommended for adults aged 73-80 years who are at high risk for developing lung cancer because of a history of smoking. A yearly low-dose CT scan of the lungs is recommended for people who have at least a 30-pack-year history of smoking and are a current smoker or have quit within the past 15 years. A pack year of smoking is smoking an average of 1 pack of cigarettes a day for 1 year (for example: 1 pack a day for 30 years or 2 packs a day for 15 years). Yearly screening should continue until the smoker has stopped smoking for at least 15 years. Yearly screening should be stopped for people who develop a health problem that would prevent them from having lung cancer treatment.  If you choose to drink alcohol, do not have more than  2 drinks per day. One drink is considered to be 12 ounces (355 mL) of beer, 5 ounces (148 mL) of wine, or 1.5 ounces (44 mL) of liquor.  Avoid use of street drugs. Do not share needles with anyone. Ask for help if you need support or instructions about stopping the use of drugs.  High blood pressure causes heart disease and increases the risk of stroke. Your blood pressure should be checked at least every 1-2 years. Ongoing high blood pressure should be treated with  medicines, if weight loss and exercise are not effective.  If you are 45-79 years old, ask your health care provider if you should take aspirin to prevent heart disease.  Diabetes screening involves taking a blood sample to check your fasting blood sugar level. This should be done once every 3 years, after age 45, if you are within normal weight and without risk factors for diabetes. Testing should be considered at a younger age or be carried out more frequently if you are overweight and have at least 1 risk factor for diabetes.  Colorectal cancer can be detected and often prevented. Most routine colorectal cancer screening begins at the age of 50 and continues through age 75. However, your health care provider may recommend screening at an earlier age if you have risk factors for colon cancer. On a yearly basis, your health care provider may provide home test kits to check for hidden blood in the stool. Use of a small camera at the end of a tube to directly examine the colon (sigmoidoscopy or colonoscopy) can detect the earliest forms of colorectal cancer. Talk to your health care provider about this at age 50, when routine screening begins. Direct exam of the colon should be repeated every 5-10 years through age 75, unless early forms of precancerous polyps or small growths are found.  People who are at an increased risk for hepatitis B should be screened for this virus. You are considered at high risk for hepatitis B if:  You were born in a country where hepatitis B occurs often. Talk with your health care provider about which countries are considered high risk.  Your parents were born in a high-risk country and you have not received a shot to protect against hepatitis B (hepatitis B vaccine).  You have HIV or AIDS.  You use needles to inject street drugs.  You live with, or have sex with, someone who has hepatitis B.  You are a man who has sex with other men (MSM).  You get hemodialysis  treatment.  You take certain medicines for conditions such as cancer, organ transplantation, and autoimmune conditions.  Hepatitis C blood testing is recommended for all people born from 1945 through 1965 and any individual with known risks for hepatitis C.  Practice safe sex. Use condoms and avoid high-risk sexual practices to reduce the spread of sexually transmitted infections (STIs). STIs include gonorrhea, chlamydia, syphilis, trichomonas, herpes, HPV, and human immunodeficiency virus (HIV). Herpes, HIV, and HPV are viral illnesses that have no cure. They can result in disability, cancer, and death.  If you are at risk of being infected with HIV, it is recommended that you take a prescription medicine daily to prevent HIV infection. This is called preexposure prophylaxis (PrEP). You are considered at risk if:  You are a man who has sex with other men (MSM) and have other risk factors.  You are a heterosexual man, are sexually active, and are at increased risk for HIV infection.    You take drugs by injection.  You are sexually active with a partner who has HIV.  Talk with your health care provider about whether you are at high risk of being infected with HIV. If you choose to begin PrEP, you should first be tested for HIV. You should then be tested every 3 months for as long as you are taking PrEP.  A one-time screening for abdominal aortic aneurysm (AAA) and surgical repair of large AAAs by ultrasound are recommended for men ages 32 to 67 years who are current or former smokers.  Healthy men should no longer receive prostate-specific antigen (PSA) blood tests as part of routine cancer screening. Talk with your health care provider about prostate cancer screening.  Testicular cancer screening is not recommended for adult males who have no symptoms. Screening includes self-exam, a health care provider exam, and other screening tests. Consult with your health care provider about any symptoms  you have or any concerns you have about testicular cancer.  Use sunscreen. Apply sunscreen liberally and repeatedly throughout the day. You should seek shade when your shadow is shorter than you. Protect yourself by wearing long sleeves, pants, a wide-brimmed hat, and sunglasses year round, whenever you are outdoors.  Once a month, do a whole-body skin exam, using a mirror to look at the skin on your back. Tell your health care provider about new moles, moles that have irregular borders, moles that are larger than a pencil eraser, or moles that have changed in shape or color.  Stay current with required vaccines (immunizations).  Influenza vaccine. All adults should be immunized every year.  Tetanus, diphtheria, and acellular pertussis (Td, Tdap) vaccine. An adult who has not previously received Tdap or who does not know his vaccine status should receive 1 dose of Tdap. This initial dose should be followed by tetanus and diphtheria toxoids (Td) booster doses every 10 years. Adults with an unknown or incomplete history of completing a 3-dose immunization series with Td-containing vaccines should begin or complete a primary immunization series including a Tdap dose. Adults should receive a Td booster every 10 years.  Varicella vaccine. An adult without evidence of immunity to varicella should receive 2 doses or a second dose if he has previously received 1 dose.  Human papillomavirus (HPV) vaccine. Males aged 68-21 years who have not received the vaccine previously should receive the 3-dose series. Males aged 22-26 years may be immunized. Immunization is recommended through the age of 6 years for any male who has sex with males and did not get any or all doses earlier. Immunization is recommended for any person with an immunocompromised condition through the age of 49 years if he did not get any or all doses earlier. During the 3-dose series, the second dose should be obtained 4-8 weeks after the first  dose. The third dose should be obtained 24 weeks after the first dose and 16 weeks after the second dose.  Zoster vaccine. One dose is recommended for adults aged 50 years or older unless certain conditions are present.  Measles, mumps, and rubella (MMR) vaccine. Adults born before [birth year] generally are considered immune to measles and mumps. Adults born in [birth year] or later should have 1 or more doses of MMR vaccine unless there is a contraindication to the vaccine or there is laboratory evidence of immunity to each of the three diseases. A routine second dose of MMR vaccine should be obtained at least 28 days after the first dose for students attending postsecondary  schools, health care workers, or international travelers. People who received inactivated measles vaccine or an unknown type of measles vaccine during 1963-1967 should receive 2 doses of MMR vaccine. People who received inactivated mumps vaccine or an unknown type of mumps vaccine before 1979 and are at high risk for mumps infection should consider immunization with 2 doses of MMR vaccine. Unvaccinated health care workers born before 1957 who lack laboratory evidence of measles, mumps, or rubella immunity or laboratory confirmation of disease should consider measles and mumps immunization with 2 doses of MMR vaccine or rubella immunization with 1 dose of MMR vaccine.  Pneumococcal 13-valent conjugate (PCV13) vaccine. When indicated, a person who is uncertain of his immunization history and has no record of immunization should receive the PCV13 vaccine. An adult aged 19 years or older who has certain medical conditions and has not been previously immunized should receive 1 dose of PCV13 vaccine. This PCV13 should be followed with a dose of pneumococcal polysaccharide (PPSV23) vaccine. The PPSV23 vaccine dose should be obtained at least 8 weeks after the dose of PCV13 vaccine. An adult aged 19 years or older who has certain medical conditions and  previously received 1 or more doses of PPSV23 vaccine should receive 1 dose of PCV13. The PCV13 vaccine dose should be obtained 1 or more years after the last PPSV23 vaccine dose.  Pneumococcal polysaccharide (PPSV23) vaccine. When PCV13 is also indicated, PCV13 should be obtained first. All adults aged 65 years and older should be immunized. An adult younger than age 65 years who has certain medical conditions should be immunized. Any person who resides in a nursing home or long-term care facility should be immunized. An adult smoker should be immunized. People with an immunocompromised condition and certain other conditions should receive both PCV13 and PPSV23 vaccines. People with human immunodeficiency virus (HIV) infection should be immunized as soon as possible after diagnosis. Immunization during chemotherapy or radiation therapy should be avoided. Routine use of PPSV23 vaccine is not recommended for American Indians, [place] Natives, or people younger than 65 years unless there are medical conditions that require PPSV23 vaccine. When indicated, people who have unknown immunization and have no record of immunization should receive PPSV23 vaccine. One-time revaccination 5 years after the first dose of PPSV23 is recommended for people aged 19-64 years who have chronic kidney failure, nephrotic syndrome, asplenia, or immunocompromised conditions. People who received 1-2 doses of PPSV23 before age 65 years should receive another dose of PPSV23 vaccine at age 65 years or later if at least 5 years have passed since the previous dose. Doses of PPSV23 are not needed for people immunized with PPSV23 at or after age 65 years.  Meningococcal vaccine. Adults with asplenia or persistent complement component deficiencies should receive 2 doses of quadrivalent meningococcal conjugate (MenACWY-D) vaccine. The doses should be obtained at least 2 months apart. Microbiologists working with certain meningococcal bacteria,  military recruits, people at risk during an outbreak, and people who travel to or live in countries with a high rate of meningitis should be immunized. A first-year college student up through age 21 years who is living in a residence hall should receive a dose if he did not receive a dose on or after his 16th birthday. Adults who have certain high-risk conditions should receive one or more doses of vaccine.  Hepatitis A vaccine. Adults who wish to be protected from this disease, have certain high-risk conditions, work with hepatitis A-infected animals, work in hepatitis A research labs, or   travel to or work in countries with a high rate of hepatitis A should be immunized. Adults who were previously unvaccinated and who anticipate close contact with an international adoptee during the first 60 days after arrival in the [place] from a country with a high rate of hepatitis A should be immunized.  Hepatitis B vaccine. Adults should be immunized if they wish to be protected from this disease, have certain high-risk conditions, may be exposed to blood or other infectious body fluids, are household contacts or sex partners of hepatitis B positive people, are clients or workers in certain care facilities, or travel to or work in countries with a high rate of hepatitis B.  Haemophilus influenzae type b (Hib) vaccine. A previously unvaccinated person with asplenia or sickle cell disease or having a scheduled splenectomy should receive 1 dose of Hib vaccine. Regardless of previous immunization, a recipient of a hematopoietic stem cell transplant should receive a 3-dose series 6-12 months after his successful transplant. Hib vaccine is not recommended for adults with HIV infection. Preventive Service / Frequency Ages 52 to 17  Blood pressure check.** / Every 1 to 2 years.  Lipid and cholesterol check.** / Every 5 years beginning at age 69.  Hepatitis C blood test.** / For any individual with known risks for  hepatitis C.  Skin self-exam. / Monthly.  Influenza vaccine. / Every year.  Tetanus, diphtheria, and acellular pertussis (Tdap, Td) vaccine.** / Consult your health care provider. 1 dose of Td every 10 years.  Varicella vaccine.** / Consult your health care provider.  HPV vaccine. / 3 doses over 6 months, if 72 or younger.  Measles, mumps, rubella (MMR) vaccine.** / You need at least 1 dose of MMR if you were born in 1957 or later. You may also need a second dose.  Pneumococcal 13-valent conjugate (PCV13) vaccine.** / Consult your health care provider.  Pneumococcal polysaccharide (PPSV23) vaccine.** / 1 to 2 doses if you smoke cigarettes or if you have certain conditions.  Meningococcal vaccine.** / 1 dose if you are age 35 to 60 years and a Market researcher living in a residence hall, or have one of several medical conditions. You may also need additional booster doses.  Hepatitis A vaccine.** / Consult your health care provider.  Hepatitis B vaccine.** / Consult your health care provider.  Haemophilus influenzae type b (Hib) vaccine.** / Consult your health care provider. Ages 35 to 8  Blood pressure check.** / Every 1 to 2 years.  Lipid and cholesterol check.** / Every 5 years beginning at age 57.  Lung cancer screening. / Every year if you are aged 44-80 years and have a 30-pack-year history of smoking and currently smoke or have quit within the past 15 years. Yearly screening is stopped once you have quit smoking for at least 15 years or develop a health problem that would prevent you from having lung cancer treatment.  Fecal occult blood test (FOBT) of stool. / Every year beginning at age 55 and continuing until age 73. You may not have to do this test if you get a colonoscopy every 10 years.  Flexible sigmoidoscopy** or colonoscopy.** / Every 5 years for a flexible sigmoidoscopy or every 10 years for a colonoscopy beginning at age 28 and continuing until age  1.  Hepatitis C blood test.** / For all people born from [birth year] through 1965 and any individual with known risks for hepatitis C.  Skin self-exam. / Monthly.  Influenza vaccine. / Every  year.  Tetanus, diphtheria, and acellular pertussis (Tdap/Td) vaccine.** / Consult your health care provider. 1 dose of Td every 10 years.  Varicella vaccine.** / Consult your health care provider.  Zoster vaccine.** / 1 dose for adults aged 60 years or older.  Measles, mumps, rubella (MMR) vaccine.** / You need at least 1 dose of MMR if you were born in 1957 or later. You may also need a second dose.  Pneumococcal 13-valent conjugate (PCV13) vaccine.** / Consult your health care provider.  Pneumococcal polysaccharide (PPSV23) vaccine.** / 1 to 2 doses if you smoke cigarettes or if you have certain conditions.  Meningococcal vaccine.** / Consult your health care provider.  Hepatitis A vaccine.** / Consult your health care provider.  Hepatitis B vaccine.** / Consult your health care provider.  Haemophilus influenzae type b (Hib) vaccine.** / Consult your health care provider. Ages 65 and over  Blood pressure check.** / Every 1 to 2 years.  Lipid and cholesterol check.**/ Every 5 years beginning at age 20.  Lung cancer screening. / Every year if you are aged 55-80 years and have a 30-pack-year history of smoking and currently smoke or have quit within the past 15 years. Yearly screening is stopped once you have quit smoking for at least 15 years or develop a health problem that would prevent you from having lung cancer treatment.  Fecal occult blood test (FOBT) of stool. / Every year beginning at age 50 and continuing until age 75. You may not have to do this test if you get a colonoscopy every 10 years.  Flexible sigmoidoscopy** or colonoscopy.** / Every 5 years for a flexible sigmoidoscopy or every 10 years for a colonoscopy beginning at age 50 and continuing until age 75.  Hepatitis C blood  test.** / For all people born from 1945 through 1965 and any individual with known risks for hepatitis C.  Abdominal aortic aneurysm (AAA) screening.** / A one-time screening for ages 65 to 75 years who are current or former smokers.  Skin self-exam. / Monthly.  Influenza vaccine. / Every year.  Tetanus, diphtheria, and acellular pertussis (Tdap/Td) vaccine.** / 1 dose of Td every 10 years.  Varicella vaccine.** / Consult your health care provider.  Zoster vaccine.** / 1 dose for adults aged 60 years or older.  Pneumococcal 13-valent conjugate (PCV13) vaccine.** / Consult your health care provider.  Pneumococcal polysaccharide (PPSV23) vaccine.** / 1 dose for all adults aged 65 years and older.  Meningococcal vaccine.** / Consult your health care provider.  Hepatitis A vaccine.** / Consult your health care provider.  Hepatitis B vaccine.** / Consult your health care provider.  Haemophilus influenzae type b (Hib) vaccine.** / Consult your health care provider. **Family history and personal history of risk and conditions may change your health care provider's recommendations. Document Released: 06/28/2001 Document Revised: 05/07/2013 Document Reviewed: 09/27/2010 [employer] Patient Information 2015 [employer]. This information is not intended to replace advice given to you by your health care provider. Make sure you discuss any questions you have with your health care provider.    Postprocedure instructions :     Keep the wounds clean. You can wash them with liquid soap and water. Pat dry with gauze or a Kleenex tissue  Before applying antibiotic ointment and a Band-Aid.   You need to report immediately  if  any signs of infection develop. 

## 2014-12-02 NOTE — Progress Notes (Signed)
Subjective:  Patient ID: David [name], male    DOB: 14-Mar-1924  Age: 81 y.o. MRN: [id number]  CC: No chief complaint on file.   HPI David [name] presents for a well exam C/o B heel pain  Outpatient Prescriptions Prior to Visit  Medication Sig Dispense Refill  . aspirin 81 MG tablet Take 81 mg by mouth daily.      Marland Kitchen atenolol (TENORMIN) 25 MG tablet Take 1 tablet (25 mg total) by mouth daily. 30 tablet 11  . ezetimibe (ZETIA) 10 MG tablet Take 1 tablet (10 mg total) by mouth daily. 90 tablet 3  . lisinopril (PRINIVIL,ZESTRIL) 5 MG tablet TAKE ONE TABLET BY MOUTH DAILY 90 tablet 2  . lubiprostone (AMITIZA) 24 MCG capsule Take 1 capsule (24 mcg total) by mouth 2 (two) times daily with a meal. 60 capsule 6  . triamcinolone (KENALOG) 0.5 % cream Apply 1 application topically 2 (two) times daily.      . furosemide (LASIX) 20 MG tablet Take 20 mg by mouth daily.    Marland Kitchen lisinopril (PRINIVIL,ZESTRIL) 10 MG tablet Take 1 tablet (10 mg total) by mouth daily. (Patient not taking: Reported on 12/02/2014) 30 tablet 11   No facility-administered medications prior to visit.    ROS Review of Systems  Constitutional: Negative for appetite change, fatigue and unexpected weight change.  HENT: Negative for congestion, nosebleeds, sneezing, sore throat and trouble swallowing.   Eyes: Negative for itching and visual disturbance.  Respiratory: Negative for cough.   Cardiovascular: Negative for chest pain, palpitations and leg swelling.  Gastrointestinal: Negative for nausea, diarrhea, blood in stool and abdominal distention.  Genitourinary: Negative for frequency and hematuria.  Musculoskeletal: Positive for arthralgias. Negative for back pain, joint swelling, gait problem and neck pain.  Skin: Negative for rash.  Neurological: Negative for dizziness, tremors, speech difficulty and weakness.  Psychiatric/Behavioral: Negative for sleep disturbance, dysphoric mood and agitation. The patient is not  nervous/anxious.     Objective:  BP 120/70 mmHg  Pulse 70  Ht 5\' 10"  (1.778 m)  Wt 204 lb (92.534 kg)  BMI 29.27 kg/m2  SpO2 98%  BP Readings from Last 3 Encounters:  12/02/14 120/70  07/17/14 146/90  04/09/14 130/90    Wt Readings from Last 3 Encounters:  12/02/14 204 lb (92.534 kg)  07/17/14 211 lb (95.709 kg)  04/09/14 216 lb (97.977 kg)    Physical Exam  Constitutional: He is oriented to person, place, and time. He appears well-developed. No distress.  NAD  HENT:  Mouth/Throat: Oropharynx is clear and moist.  Eyes: Conjunctivae are normal. Pupils are equal, round, and reactive to light.  Neck: Normal range of motion. No JVD present. No thyromegaly present.  Cardiovascular: Normal rate, regular rhythm, normal heart sounds and intact distal pulses.  Exam reveals no gallop and no friction rub.   No murmur heard. Pulmonary/Chest: Effort normal and breath sounds normal. No respiratory distress. He has no wheezes. He has no rales. He exhibits no tenderness.  Abdominal: Soft. Bowel sounds are normal. He exhibits no distension and no mass. There is no tenderness. There is no rebound and no guarding.  Musculoskeletal: Normal range of motion. He exhibits no edema or tenderness.  Lymphadenopathy:    He has no cervical adenopathy.  Neurological: He is alert and oriented to person, place, and time. He has normal reflexes. No cranial nerve deficit. He exhibits normal muscle tone. He displays a negative Romberg sign. Coordination and gait normal.  Skin: Skin  is warm and dry. No rash noted.  Psychiatric: He has a normal mood and affect. His behavior is normal. Judgment and thought content normal.  B heels are tender; old shoes are on Pt declined rectal exam AK on R shoulder  Lab Results  Component Value Date   WBC 6.2 07/17/2014   HGB 16.5 07/17/2014   HCT 48.0 07/17/2014   PLT 115.0* 07/17/2014   GLUCOSE 120* 07/17/2014   CHOL 167 07/17/2014   TRIG 55.0 07/17/2014   HDL  52.60 07/17/2014   LDLDIRECT 136.2 08/01/2007   LDLCALC 103* 07/17/2014   ALT 14 07/17/2014   AST 17 07/17/2014   NA 141 07/17/2014   K 4.2 07/17/2014   CL 107 07/17/2014   CREATININE 1.29 07/17/2014   BUN 19 07/17/2014   CO2 30 07/17/2014   TSH 2.47 11/18/2013   PSA 2.72 11/18/2013   INR 1.0 01/08/2007   HGBA1C 6.0 07/17/2014    Procedure Note :     Procedure : Cryosurgery   Indication: Actinic keratosis(es)   Risks including unsuccessful procedure , bleeding, infection, bruising, scar, a need for a repeat  procedure and others were explained to the patient in detail as well as the benefits. Informed consent was obtained verbally.   1  lesion(s)  on  R shoulder back  was/were treated with liquid nitrogen on a Q-tip in a usual fasion . Band-Aid was applied and antibiotic ointment was given for a later use.   Tolerated well. Complications none.   Postprocedure instructions :     Keep the wounds clean. You can wash them with liquid soap and water. Pat dry with gauze or a Kleenex tissue  Before applying antibiotic ointment and a Band-Aid.   You need to report immediately  if  any signs of infection develop.     No results found.  Assessment & Plan:   Diagnoses and all orders for this visit:  Well adult exam  Essential hypertension  Plantar fasciitis, bilateral  Impaired glucose tolerance  S/P AVR (aortic valve replacement)  Elevated hemoglobin  I am having Mr. Jayne maintain his aspirin, triamcinolone cream, furosemide, atenolol, lisinopril, ezetimibe, lubiprostone, and lisinopril.  No orders of the defined types were placed in this encounter.     Follow-up: No Follow-up on file.  Walker Kehr, MD

## 2015-03-09 ENCOUNTER — Encounter: Payer: Self-pay | Admitting: Internal Medicine

## 2015-03-09 ENCOUNTER — Ambulatory Visit (INDEPENDENT_AMBULATORY_CARE_PROVIDER_SITE_OTHER): Payer: Medicare Other | Admitting: Internal Medicine

## 2015-03-09 VITALS — BP 160/80 | HR 65 | Wt 206.0 lb

## 2015-03-09 DIAGNOSIS — I2510 Atherosclerotic heart disease of native coronary artery without angina pectoris: Secondary | ICD-10-CM | POA: Diagnosis not present

## 2015-03-09 DIAGNOSIS — H8111 Benign paroxysmal vertigo, right ear: Secondary | ICD-10-CM

## 2015-03-09 DIAGNOSIS — Z954 Presence of other heart-valve replacement: Secondary | ICD-10-CM | POA: Diagnosis not present

## 2015-03-09 DIAGNOSIS — R7302 Impaired glucose tolerance (oral): Secondary | ICD-10-CM | POA: Diagnosis not present

## 2015-03-09 DIAGNOSIS — Z952 Presence of prosthetic heart valve: Secondary | ICD-10-CM

## 2015-03-09 DIAGNOSIS — I10 Essential (primary) hypertension: Secondary | ICD-10-CM | POA: Diagnosis not present

## 2015-03-09 DIAGNOSIS — H8110 Benign paroxysmal vertigo, unspecified ear: Secondary | ICD-10-CM | POA: Insufficient documentation

## 2015-03-09 MED ORDER — MECLIZINE HCL 12.5 MG PO TABS: 12.5000 mg | ORAL_TABLET | Freq: Three times a day (TID) | ORAL | Status: DC | PRN

## 2015-03-09 NOTE — Progress Notes (Signed)
Pre visit review using our clinic review tool, if applicable. No additional management support is needed unless otherwise documented below in the visit note. 

## 2015-03-09 NOTE — Assessment & Plan Note (Signed)
Benign Positional Vertigo symptoms on the R. Start Meclizine. Start Brandt - Daroff exercise several times a day as dirrected.  

## 2015-03-09 NOTE — Assessment & Plan Note (Signed)
Doing well 

## 2015-03-09 NOTE — Progress Notes (Signed)
Subjective:  Patient ID: David [name], male    DOB: Nov 23, 1923  Age: 81 y.o. MRN: [id number]  CC: Dizziness   HPI CAILLOU MINUS presents for HTN, CAD, dyslipidemia. C/o dizziness x 2 d   Outpatient Prescriptions Prior to Visit  Medication Sig Dispense Refill  . aspirin 81 MG tablet Take 81 mg by mouth daily.      Marland Kitchen atenolol (TENORMIN) 25 MG tablet Take 1 tablet (25 mg total) by mouth daily. 30 tablet 11  . cholecalciferol (VITAMIN D) 1000 UNITS tablet Take 1 tablet (1,000 Units total) by mouth daily. 100 tablet 3  . ezetimibe (ZETIA) 10 MG tablet Take 1 tablet (10 mg total) by mouth daily. 90 tablet 3  . lisinopril (PRINIVIL,ZESTRIL) 5 MG tablet TAKE ONE TABLET BY MOUTH DAILY 90 tablet 2  . lubiprostone (AMITIZA) 24 MCG capsule Take 1 capsule (24 mcg total) by mouth 2 (two) times daily with a meal. 60 capsule 6  . furosemide (LASIX) 20 MG tablet Take 20 mg by mouth daily.    Marland Kitchen triamcinolone (KENALOG) 0.5 % cream Apply 1 application topically 2 (two) times daily.       No facility-administered medications prior to visit.    ROS Review of Systems  Constitutional: Negative for appetite change, fatigue and unexpected weight change.  HENT: Negative for congestion, nosebleeds, sneezing, sore throat and trouble swallowing.   Eyes: Negative for itching and visual disturbance.  Respiratory: Negative for cough.   Cardiovascular: Negative for chest pain, palpitations and leg swelling.  Gastrointestinal: Negative for nausea, diarrhea, blood in stool and abdominal distention.  Genitourinary: Negative for frequency and hematuria.  Musculoskeletal: Negative for back pain, joint swelling, gait problem and neck pain.  Skin: Negative for rash.  Neurological: Positive for dizziness. Negative for tremors, speech difficulty, weakness and light-headedness.  Psychiatric/Behavioral: Negative for sleep disturbance, dysphoric mood and agitation. The patient is not nervous/anxious.     Objective:    BP 160/80 mmHg  Pulse 65  Wt 206 lb (93.441 kg)  SpO2 98%  BP Readings from Last 3 Encounters:  03/09/15 160/80  12/02/14 120/70  07/17/14 146/90    Wt Readings from Last 3 Encounters:  03/09/15 206 lb (93.441 kg)  12/02/14 204 lb (92.534 kg)  07/17/14 211 lb (95.709 kg)    Physical Exam  Constitutional: He is oriented to person, place, and time. He appears well-developed. No distress.  NAD  HENT:  Mouth/Throat: Oropharynx is clear and moist.  Eyes: Conjunctivae are normal. Pupils are equal, round, and reactive to light.  Neck: Normal range of motion. No JVD present. No thyromegaly present.  Cardiovascular: Normal rate, regular rhythm, normal heart sounds and intact distal pulses.  Exam reveals no gallop and no friction rub.   No murmur heard. Pulmonary/Chest: Effort normal and breath sounds normal. No respiratory distress. He has no wheezes. He has no rales. He exhibits no tenderness.  Abdominal: Soft. Bowel sounds are normal. He exhibits no distension and no mass. There is no tenderness. There is no rebound and no guarding.  Musculoskeletal: Normal range of motion. He exhibits no edema or tenderness.  Lymphadenopathy:    He has no cervical adenopathy.  Neurological: He is alert and oriented to person, place, and time. He has normal reflexes. No cranial nerve deficit. He exhibits normal muscle tone. He displays a negative Romberg sign. Coordination and gait normal.  Skin: Skin is warm and dry. No rash noted.  Psychiatric: He has a normal mood and affect.  His behavior is normal. Judgment and thought content normal.  H-P (+) on R  Lab Results  Component Value Date   WBC 8.3 12/02/2014   HGB 17.0 12/02/2014   HCT 50.4 12/02/2014   PLT 122.0* 12/02/2014   GLUCOSE 103* 12/02/2014   CHOL 192 12/02/2014   TRIG 106.0 12/02/2014   HDL 50.50 12/02/2014   LDLDIRECT 136.2 08/01/2007   LDLCALC 120* 12/02/2014   ALT 12 12/02/2014   AST 15 12/02/2014   NA 141 12/02/2014   K 4.0  12/02/2014   CL 104 12/02/2014   CREATININE 1.30 12/02/2014   BUN 17 12/02/2014   CO2 28 12/02/2014   TSH 2.58 12/02/2014   PSA 3.97 12/02/2014   INR 1.0 01/08/2007   HGBA1C 5.8 12/02/2014    No results found.  Assessment & Plan:   Winton was seen today for dizziness.  Diagnoses and all orders for this visit:  Benign paroxysmal positional vertigo, right Comments: 10/16  Essential hypertension  Other orders -     meclizine (ANTIVERT) 12.5 MG tablet; Take 1 tablet (12.5 mg total) by mouth 3 (three) times daily as needed for dizziness.   I am having Mr. Lacross start on meclizine. I am also having him maintain his aspirin, triamcinolone cream, furosemide, atenolol, ezetimibe, lubiprostone, lisinopril, and cholecalciferol.  Meds ordered this encounter  Medications  . meclizine (ANTIVERT) 12.5 MG tablet    Sig: Take 1 tablet (12.5 mg total) by mouth 3 (three) times daily as needed for dizziness.    Dispense:  60 tablet    Refill:  1     Follow-up: No Follow-up on file.  Walker Kehr, MD

## 2015-03-09 NOTE — Assessment & Plan Note (Signed)
Diet - limit sweets

## 2015-03-09 NOTE — Patient Instructions (Addendum)
Benign Positional Vertigo symptoms on the R Start Meclizine. Start Laruth Bouchard - Daroff exercise several times a day as dirrected. Do not drive if dizzy.

## 2015-03-09 NOTE — Assessment & Plan Note (Signed)
Restart Lisinopril.

## 2015-04-03 DIAGNOSIS — H2513 Age-related nuclear cataract, bilateral: Secondary | ICD-10-CM | POA: Diagnosis not present

## 2015-04-21 ENCOUNTER — Other Ambulatory Visit: Payer: Self-pay | Admitting: Internal Medicine

## 2015-06-04 ENCOUNTER — Other Ambulatory Visit (INDEPENDENT_AMBULATORY_CARE_PROVIDER_SITE_OTHER): Payer: Medicare Other

## 2015-06-04 ENCOUNTER — Ambulatory Visit (INDEPENDENT_AMBULATORY_CARE_PROVIDER_SITE_OTHER): Payer: Medicare Other | Admitting: Internal Medicine

## 2015-06-04 ENCOUNTER — Encounter: Payer: Self-pay | Admitting: Internal Medicine

## 2015-06-04 VITALS — BP 150/70 | HR 63 | Wt 209.0 lb

## 2015-06-04 DIAGNOSIS — H8111 Benign paroxysmal vertigo, right ear: Secondary | ICD-10-CM

## 2015-06-04 DIAGNOSIS — R7302 Impaired glucose tolerance (oral): Secondary | ICD-10-CM | POA: Diagnosis not present

## 2015-06-04 DIAGNOSIS — I10 Essential (primary) hypertension: Secondary | ICD-10-CM | POA: Diagnosis not present

## 2015-06-04 DIAGNOSIS — I2510 Atherosclerotic heart disease of native coronary artery without angina pectoris: Secondary | ICD-10-CM | POA: Diagnosis not present

## 2015-06-04 DIAGNOSIS — Z952 Presence of prosthetic heart valve: Secondary | ICD-10-CM

## 2015-06-04 DIAGNOSIS — K219 Gastro-esophageal reflux disease without esophagitis: Secondary | ICD-10-CM

## 2015-06-04 DIAGNOSIS — Z954 Presence of other heart-valve replacement: Secondary | ICD-10-CM

## 2015-06-04 LAB — URINALYSIS
Bilirubin Urine: NEGATIVE
Hgb urine dipstick: NEGATIVE
KETONES UR: NEGATIVE
LEUKOCYTES UA: NEGATIVE
Nitrite: NEGATIVE
SPECIFIC GRAVITY, URINE: 1.025 (ref 1.000–1.030)
Total Protein, Urine: NEGATIVE
UROBILINOGEN UA: 0.2 (ref 0.0–1.0)
Urine Glucose: NEGATIVE
pH: 5.5 (ref 5.0–8.0)

## 2015-06-04 LAB — BASIC METABOLIC PANEL
BUN: 16 mg/dL (ref 6–23)
CHLORIDE: 105 meq/L (ref 96–112)
CO2: 29 mEq/L (ref 19–32)
Calcium: 9.6 mg/dL (ref 8.4–10.5)
Creatinine, Ser: 1.36 mg/dL (ref 0.40–1.50)
GFR: 52.35 mL/min — ABNORMAL LOW (ref >60.00)
GLUCOSE: 98 mg/dL (ref 70–99)
POTASSIUM: 4.4 meq/L (ref 3.5–5.1)
Sodium: 141 mEq/L (ref 135–145)

## 2015-06-04 LAB — LIPID PANEL
CHOL/HDL RATIO: 4
CHOLESTEROL: 170 mg/dL (ref 0–200)
HDL: 45.8 mg/dL (ref >39.00)
LDL Cholesterol: 106 mg/dL — ABNORMAL HIGH (ref 0–99)
NONHDL: 124.08
TRIGLYCERIDES: 88 mg/dL (ref 0.0–149.0)
VLDL: 17.6 mg/dL (ref 0.0–40.0)

## 2015-06-04 LAB — TSH: TSH: 3.89 u[IU]/mL (ref 0.35–4.50)

## 2015-06-04 LAB — HEMOGLOBIN A1C: HEMOGLOBIN A1C: 5.8 % (ref 4.6–6.5)

## 2015-06-04 NOTE — Progress Notes (Signed)
Subjective:  Patient ID: David [name], male    DOB: [birth year]  Age: 82 y.o. MRN: [id number]  CC: No chief complaint on file.   HPI David [name] presents for HTN, OA, CAD f/u  Outpatient Prescriptions Prior to Visit  Medication Sig Dispense Refill  . aspirin 81 MG tablet Take 81 mg by mouth daily.      Marland Kitchen atenolol (TENORMIN) 25 MG tablet TAKE ONE TABLET BY MOUTH ONE TIME DAILY 30 tablet 6  . cholecalciferol (VITAMIN D) 1000 UNITS tablet Take 1 tablet (1,000 Units total) by mouth daily. 100 tablet 3  . ezetimibe (ZETIA) 10 MG tablet Take 1 tablet (10 mg total) by mouth daily. 90 tablet 3  . lisinopril (PRINIVIL,ZESTRIL) 5 MG tablet TAKE ONE TABLET BY MOUTH DAILY 90 tablet 2  . lubiprostone (AMITIZA) 24 MCG capsule Take 1 capsule (24 mcg total) by mouth 2 (two) times daily with a meal. 60 capsule 6  . meclizine (ANTIVERT) 12.5 MG tablet Take 1 tablet (12.5 mg total) by mouth 3 (three) times daily as needed for dizziness. 60 tablet 1  . triamcinolone (KENALOG) 0.5 % cream Apply 1 application topically 2 (two) times daily.      . furosemide (LASIX) 20 MG tablet Take 20 mg by mouth daily.     No facility-administered medications prior to visit.    ROS Review of Systems  Constitutional: Negative for appetite change, fatigue and unexpected weight change.  HENT: Negative for congestion, nosebleeds, sneezing, sore throat and trouble swallowing.   Eyes: Negative for itching and visual disturbance.  Respiratory: Negative for cough.   Cardiovascular: Negative for chest pain, palpitations and leg swelling.  Gastrointestinal: Negative for nausea, diarrhea, blood in stool and abdominal distention.  Genitourinary: Negative for frequency and hematuria.  Musculoskeletal: Negative for back pain, joint swelling, gait problem and neck pain.  Skin: Negative for rash.  Neurological: Negative for dizziness, tremors, speech difficulty and weakness.  Psychiatric/Behavioral: Negative for sleep  disturbance, dysphoric mood and agitation. The patient is not nervous/anxious.     Objective:  BP 150/70 mmHg  Pulse 63  Wt 209 lb (94.802 kg)  SpO2 98%  BP Readings from Last 3 Encounters:  06/04/15 150/70  03/09/15 160/80  12/02/14 120/70    Wt Readings from Last 3 Encounters:  06/04/15 209 lb (94.802 kg)  03/09/15 206 lb (93.441 kg)  12/02/14 204 lb (92.534 kg)    Physical Exam  Constitutional: He is oriented to person, place, and time. He appears well-developed and well-nourished. No distress.  HENT:  Head: Normocephalic and atraumatic.  Right Ear: External ear normal.  Left Ear: External ear normal.  Nose: Nose normal.  Mouth/Throat: Oropharynx is clear and moist. No oropharyngeal exudate.  Eyes: Conjunctivae and EOM are normal. Pupils are equal, round, and reactive to light. Right eye exhibits no discharge. Left eye exhibits no discharge. No scleral icterus.  Neck: Normal range of motion. Neck supple. No JVD present. No tracheal deviation present. No thyromegaly present.  Cardiovascular: Normal rate, regular rhythm, normal heart sounds and intact distal pulses.  Exam reveals no gallop and no friction rub.   No murmur heard. Pulmonary/Chest: Effort normal and breath sounds normal. No stridor. No respiratory distress. He has no wheezes. He has no rales. He exhibits no tenderness.  Abdominal: Soft. Bowel sounds are normal. He exhibits no distension and no mass. There is no tenderness. There is no rebound and no guarding.  Musculoskeletal: Normal range of motion. He exhibits no  edema or tenderness.  Lymphadenopathy:    He has no cervical adenopathy.  Neurological: He is alert and oriented to person, place, and time. He has normal reflexes. No cranial nerve deficit. He exhibits normal muscle tone. Coordination normal.  Skin: Skin is warm and dry. No rash noted. He is not diaphoretic. No erythema. No pallor.  Psychiatric: He has a normal mood and affect. His behavior is  normal. Judgment and thought content normal.    Lab Results  Component Value Date   WBC 8.3 12/02/2014   HGB 17.0 12/02/2014   HCT 50.4 12/02/2014   PLT 122.0* 12/02/2014   GLUCOSE 103* 12/02/2014   CHOL 192 12/02/2014   TRIG 106.0 12/02/2014   HDL 50.50 12/02/2014   LDLDIRECT 136.2 08/01/2007   LDLCALC 120* 12/02/2014   ALT 12 12/02/2014   AST 15 12/02/2014   NA 141 12/02/2014   K 4.0 12/02/2014   CL 104 12/02/2014   CREATININE 1.30 12/02/2014   BUN 17 12/02/2014   CO2 28 12/02/2014   TSH 2.58 12/02/2014   PSA 3.97 12/02/2014   INR 1.0 01/08/2007   HGBA1C 5.8 12/02/2014    No results found.  Assessment & Plan:   Diagnoses and all orders for this visit:  Atherosclerosis of native coronary artery of native heart without angina pectoris -     Basic metabolic panel; Future -     Hemoglobin A1c; Future -     TSH; Future -     Urinalysis; Future -     Lipid panel; Future  Essential hypertension -     Basic metabolic panel; Future -     Hemoglobin A1c; Future -     TSH; Future -     Urinalysis; Future -     Lipid panel; Future  Gastroesophageal reflux disease without esophagitis -     Basic metabolic panel; Future -     Hemoglobin A1c; Future -     TSH; Future -     Urinalysis; Future -     Lipid panel; Future  Impaired glucose tolerance -     Basic metabolic panel; Future -     Hemoglobin A1c; Future -     TSH; Future -     Urinalysis; Future -     Lipid panel; Future  Benign paroxysmal positional vertigo, right -     Basic metabolic panel; Future -     Hemoglobin A1c; Future -     TSH; Future -     Urinalysis; Future -     Lipid panel; Future  I am having David [name] maintain his aspirin, triamcinolone cream, furosemide, ezetimibe, lubiprostone, lisinopril, cholecalciferol, meclizine, and atenolol.  No orders of the defined types were placed in this encounter.     Follow-up: Return in about 6 months (around 12/02/2015) for Wellness Exam.  Walker Kehr, MD

## 2015-06-04 NOTE — Assessment & Plan Note (Signed)
No sx's 

## 2015-06-04 NOTE — Assessment & Plan Note (Signed)
Labs

## 2015-06-04 NOTE — Progress Notes (Signed)
Pre visit review using our clinic review tool, if applicable. No additional management support is needed unless otherwise documented below in the visit note. 

## 2015-06-04 NOTE — Assessment & Plan Note (Signed)
Doing well 

## 2015-06-04 NOTE — Assessment & Plan Note (Signed)
Statin intolerant Atenolol, ASA, Zetia 

## 2015-06-04 NOTE — Assessment & Plan Note (Signed)
Off Rx 

## 2015-06-04 NOTE — Assessment & Plan Note (Signed)
Lisinopril, Atenolol and Lasix  Labs

## 2015-07-06 ENCOUNTER — Other Ambulatory Visit: Payer: Self-pay | Admitting: Internal Medicine

## 2015-11-02 ENCOUNTER — Telehealth: Payer: Self-pay | Admitting: Cardiovascular Disease

## 2015-11-02 DIAGNOSIS — Z953 Presence of xenogenic heart valve: Secondary | ICD-10-CM

## 2015-11-02 NOTE — Telephone Encounter (Signed)
Returned call. Patient denies acute concerns. He just received or found a recall letter and wanted to follow up w/ [place]. He has not been seen in 2 years - aware no upcoming appt availability. I have added to waitlist. Pt prefers to continue seeing Dr. Claiborne Billings as his cardiologist.  He has OV w Dr Alain Marion in July. Wondering if any recommendations for tests, labwork -- repeat echocardiogram, etc.  Pt aware I will defer to Dr. Claiborne Billings for suggestions & that he may call if new concerns or problems.

## 2015-11-02 NOTE — Telephone Encounter (Signed)
Pt gets echo done every year and hasn't been seen since 2015-can get order if needed?

## 2015-11-03 NOTE — Telephone Encounter (Signed)
Schedule pt for an echo to re-evaluate his bioprosthetic valve and arrange a f/u OV with me in July or August; can add to schedule

## 2015-11-03 NOTE — Telephone Encounter (Signed)
Left msg for patient to call. 

## 2015-11-04 NOTE — Telephone Encounter (Signed)
David [name] is returning your call . Please cal ( ask that you call after lunch ) he will be back home then .Marland Kitchen

## 2015-11-04 NOTE — Telephone Encounter (Signed)
Returned call for message left on my machine, spoke w/ patient and gave recommendations. He is aware I will send msg to scheduler and that he should anticipate a call this week to set up echo and return office visit. Aware to call if further needs, new concerns, or if he has questions. Pt voiced understanding and thanks for call.

## 2015-11-04 NOTE — Telephone Encounter (Signed)
Left msg for patient to call at his convenience.

## 2015-11-05 ENCOUNTER — Encounter: Payer: Self-pay | Admitting: Cardiovascular Disease

## 2015-11-10 ENCOUNTER — Other Ambulatory Visit: Payer: Self-pay | Admitting: Internal Medicine

## 2015-11-23 ENCOUNTER — Ambulatory Visit (HOSPITAL_COMMUNITY): Payer: Medicare Other | Attending: Cardiology

## 2015-11-23 ENCOUNTER — Other Ambulatory Visit: Payer: Self-pay

## 2015-11-23 DIAGNOSIS — I119 Hypertensive heart disease without heart failure: Secondary | ICD-10-CM | POA: Insufficient documentation

## 2015-11-23 DIAGNOSIS — I371 Nonrheumatic pulmonary valve insufficiency: Secondary | ICD-10-CM | POA: Insufficient documentation

## 2015-11-23 DIAGNOSIS — J449 Chronic obstructive pulmonary disease, unspecified: Secondary | ICD-10-CM | POA: Diagnosis not present

## 2015-11-23 DIAGNOSIS — Z953 Presence of xenogenic heart valve: Secondary | ICD-10-CM | POA: Insufficient documentation

## 2015-11-23 DIAGNOSIS — Z87891 Personal history of nicotine dependence: Secondary | ICD-10-CM | POA: Diagnosis not present

## 2015-11-23 DIAGNOSIS — I359 Nonrheumatic aortic valve disorder, unspecified: Secondary | ICD-10-CM | POA: Diagnosis present

## 2015-11-23 DIAGNOSIS — I071 Rheumatic tricuspid insufficiency: Secondary | ICD-10-CM | POA: Insufficient documentation

## 2015-11-23 DIAGNOSIS — I059 Rheumatic mitral valve disease, unspecified: Secondary | ICD-10-CM | POA: Insufficient documentation

## 2015-11-23 DIAGNOSIS — E785 Hyperlipidemia, unspecified: Secondary | ICD-10-CM | POA: Diagnosis not present

## 2015-11-23 DIAGNOSIS — I2510 Atherosclerotic heart disease of native coronary artery without angina pectoris: Secondary | ICD-10-CM | POA: Insufficient documentation

## 2015-11-23 DIAGNOSIS — I77810 Thoracic aortic ectasia: Secondary | ICD-10-CM | POA: Insufficient documentation

## 2015-11-23 DIAGNOSIS — I350 Nonrheumatic aortic (valve) stenosis: Secondary | ICD-10-CM | POA: Insufficient documentation

## 2015-11-23 DIAGNOSIS — Z954 Presence of other heart-valve replacement: Secondary | ICD-10-CM | POA: Diagnosis not present

## 2015-11-23 DIAGNOSIS — I428 Other cardiomyopathies: Secondary | ICD-10-CM | POA: Insufficient documentation

## 2015-11-27 ENCOUNTER — Ambulatory Visit (INDEPENDENT_AMBULATORY_CARE_PROVIDER_SITE_OTHER): Payer: Medicare Other | Admitting: Cardiovascular Disease

## 2015-11-27 VITALS — BP 153/79 | HR 62 | Wt 209.6 lb

## 2015-11-27 DIAGNOSIS — H8111 Benign paroxysmal vertigo, right ear: Secondary | ICD-10-CM

## 2015-11-27 DIAGNOSIS — I10 Essential (primary) hypertension: Secondary | ICD-10-CM

## 2015-11-27 DIAGNOSIS — I2510 Atherosclerotic heart disease of native coronary artery without angina pectoris: Secondary | ICD-10-CM

## 2015-11-27 DIAGNOSIS — Z952 Presence of prosthetic heart valve: Secondary | ICD-10-CM

## 2015-11-27 DIAGNOSIS — Z954 Presence of other heart-valve replacement: Secondary | ICD-10-CM | POA: Diagnosis not present

## 2015-11-27 DIAGNOSIS — E785 Hyperlipidemia, unspecified: Secondary | ICD-10-CM

## 2015-11-27 MED ORDER — FUROSEMIDE 20 MG PO TABS: 20.0000 mg | ORAL_TABLET | Freq: Every day | ORAL | Status: DC

## 2015-11-27 NOTE — Patient Instructions (Signed)
Your physician wants you to follow-up in: 1 YEAR OR SOONER IF NEEDED. You will receive a reminder letter in the mail two months in advance. If you don't receive a letter, please call our office to schedule the follow-up appointment.   If you need a refill on your cardiac medications before your next appointment, please call your pharmacy. 

## 2015-11-29 ENCOUNTER — Encounter: Payer: Self-pay | Admitting: Cardiovascular Disease

## 2015-11-29 NOTE — Progress Notes (Signed)
Patient ID: David [name], male   DOB: September 05, 1923, 82 y.o.   MRN: [id number]    Primary M.D. Dr. Alain Marion  PATIENT PROFILE: David [name] is a 82 y.o. male who presents to the office to establish cardiology care with me.  He is a former patient of Dr. Rollene Fare.  He establish care with me in June 2015.  He presents for 2 year follow-up evaluation.   HPI:  David [name] is a 82 y.o. male who has a history of hyperlipidemia, and valvular heart disease.  In 2000, he underwent CABG revascularization surgery and had a LIMA placed to his LAD, SVG to diagonal vessel, sequential vein graft to 2 marginal branches of his left circumflex artery, and a vein graft to the distal right coronary artery.  At that time, he had a porcine aortic valve replacement for aortic stenosis.  A repeat cardiac catheterization in August 2008, showed significant native CAD, but with patent grafts. A nuclear perfusion study in 2011, remained low risk.  An echo Doppler study in December 2013 showed moderate left ventricular hypertrophy with an ejection fraction of 50-55%.  His aortic bioprosthesis remained competent with suggestion of very mild stenosis.  The peak gradient was 19 mm.  Valve area 1.5 cm.  He's had mild carotid bruits and has undergone carotid studies.  He denies any recent anginal symptoms or palpitations.  He denies any episodes of syncope or presyncope.  He does have a history of hyperlipidemia, for which he has been on Zetia.  He has been intolerant to statins.    Over the past 2 years, he is remained stable.  He underwent a follow-up echo Doppler study on 11/23/2015.  This showed an ejection fraction at 55-60%.  He did not have regional wall motion abnormalities.  There was grade 2 diastolic dysfunction.  His aortic valve bioprosthesis was well-seated.  His bioprosthetic valve had a peak gradient of 19 mm and mean gradient of 10 mm, which does not appear to be significant.  We changed from 2013.  There was mitral  annular calcification.  His ascending aorta was upper normal to mildly increased.  There was trivial TR and PR.  He presents for follow-up evaluation.  Past Medical History  Diagnosis Date  . Current use of long term anticoagulation   . Hyperlipidemia   . Hypertension   . OA (osteoarthritis)   . COPD (chronic obstructive pulmonary disease) ([place])   . GERD (gastroesophageal reflux disease)   . LBP (low back pain)   . S/P AVR (aortic valve replacement) 2009    bonine valve  . CAD (coronary artery disease)     intolerance of statins  . Impaired glucose tolerance 02/14/2011    Past Surgical History  Procedure Laterality Date  . Aortic valve replacement  01/15/1999    bovine  . Coronary artery bypass graft  10/09/1998    LIMA to LAD, SVG to diag, SVG sequential to 2 marginals, SVG sequential to PDA & PLA. CE#25 porcine AVR.  Marland Kitchen Carotid doppler  04/22/2010    Bilateral ICAs-no evidence of diametere reduction, significant tortuosity, or any other vascular abnormality.  . Cardiac catheterization  10/05/1998    Recommend CABG revascularizaition  . Cardiac catheterization  06/11/1999    Continue medical therapy  . Cardiac catheterization  01/08/2007    Patent grafts, recommend medical therapy  . Cardiovascular stress test  04/22/2010    Small lateral defect which is partially reversible, no ischemic EKG changes were noted.  Marland Kitchen  Transthoracic echocardiogram  05/14/2012    EF 50-55%, bioprosthesis of the aortic valve with well preserved LV function, moderate concentric [place]    Allergies  Allergen Reactions  . Atorvastatin   . Colesevelam   . Simvastatin   . Sulfa Antibiotics     Current Outpatient Prescriptions  Medication Sig Dispense Refill  . aspirin 81 MG tablet Take 81 mg by mouth daily.      Marland Kitchen atenolol (TENORMIN) 25 MG tablet TAKE ONE TABLET BY MOUTH ONE TIME DAILY 30 tablet 6  . cholecalciferol (VITAMIN D) 1000 UNITS tablet Take 1 tablet (1,000 Units total) by mouth daily. 100 tablet  3  . ezetimibe (ZETIA) 10 MG tablet Take 1 tablet (10 mg total) by mouth daily. 90 tablet 3  . lisinopril (PRINIVIL,ZESTRIL) 5 MG tablet TAKE ONE TABLET BY MOUTH DAILY 90 tablet 2  . meclizine (ANTIVERT) 12.5 MG tablet TAKE 1 TABLET 3 TIMES A DAY AS NEEDED DIZZINESS 60 tablet 0  . furosemide (LASIX) 20 MG tablet Take 1 tablet (20 mg total) by mouth daily. 30 tablet 11   No current facility-administered medications for this visit.    Socially, he is widowed.  4 children 6 grandchildren, and 12 great-grandchildren.  He does walk.  There is no tobacco use.  Family History  Problem Relation Age of Onset  . Coronary artery disease Other   . Hypertension Other     ROS General: Negative; No fevers, chills, or night sweats HEENT: Negative; No changes in vision or hearing, sinus congestion, difficulty swallowing Pulmonary: Negative; No cough, wheezing, shortness of breath, hemoptysis Cardiovascular:  See HPI; No chest pain, presyncope, syncope, palpitations Positive for mild ankle edema GI: Positive for ventral hernia; No nausea, vomiting, diarrhea, or abdominal pain GU: Negative; No dysuria, hematuria, or difficulty voiding Musculoskeletal: Negative; no myalgias, joint pain, or weakness Hematologic/Oncologic: Negative; no easy bruising, bleeding Endocrine: Negative; no heat/cold intolerance; no diabetes Neuro: Negative; no changes in balance, headaches Skin: Negative; No rashes or skin lesions Psychiatric: Negative; No behavioral problems, depression Sleep: Negative; No daytime sleepiness, hypersomnolence, bruxism, restless legs, hypnogagnic hallucinations Other comprehensive 14 point system review is negative   Physical Exam BP 153/79 mmHg  Pulse 62  Wt 209 lb 9.6 oz (95.074 kg)   Repeat blood pressure by me 146/80  Wt Readings from Last 3 Encounters:  11/27/15 209 lb 9.6 oz (95.074 kg)  06/04/15 209 lb (94.802 kg)  03/09/15 206 lb (93.441 kg)   General: Alert, oriented, no  distress.  Skin: normal turgor, no rashes, warm and dry HEENT: Normocephalic, atraumatic. Pupils equal round and reactive to light; sclera anicteric; extraocular muscles intact; Fundi no hemorrhages or exudates.   Nose without nasal septal hypertrophy Mouth/Parynx benign; Mallinpatti scale 3 Neck: No JVD, no carotid bruits; normal carotid upstroke Lungs: clear to ausculatation and percussion; no wheezing or rales Chest wall: without tenderness to palpitation Heart: PMI not displaced, RRR, s1 s2 normal, 2/6 systolic murmur, no diastolic murmur, no rubs, gallops, thrills, or heaves Abdomen: soft, nontender; no hepatosplenomehaly, BS+; abdominal aorta nontender and not dilated by palpation. Back: no CVA tenderness Pulses 2+ Musculoskeletal: full range of motion, normal strength, no joint deformities Extremities: Mild left ankle edema, left greater than right. no clubbing cyanosis  Homan's sign negative  Neurologic: grossly nonfocal; Cranial nerves grossly wnl Psychologic: Normal mood and affect  ECG (independently read by me): Normal sinus rhythm at 62 bpm.  Left bundle-branch block with repolarization changes.  June 2015 ECG (independently read  by me): Sinus rhythm at 70 beats per minute.  Left bundle branch block.  QTc interval 464 ms.  LABS: BMP Latest Ref Rng 06/04/2015 12/02/2014 07/17/2014  Glucose 70 - 99 mg/dL 98 103(H) 120(H)  BUN 6 - 23 mg/dL '16 17 19  ' Creatinine 0.40 - 1.50 mg/dL 1.36 1.30 1.29  Sodium 135 - 145 mEq/L 141 141 141  Potassium 3.5 - 5.1 mEq/L 4.4 4.0 4.2  Chloride 96 - 112 mEq/L 105 104 107  CO2 19 - 32 mEq/L '29 28 30  ' Calcium 8.4 - 10.5 mg/dL 9.6 9.7 9.6   Hepatic Function Latest Ref Rng 12/02/2014 07/17/2014 11/18/2013  Total Protein 6.0 - 8.3 g/dL 7.6 7.7 7.4  Albumin 3.5 - 5.2 g/dL 4.4 4.3 4.1  AST 0 - 37 U/L '15 17 23  ' ALT 0 - 53 U/L '12 14 18  ' Alk Phosphatase 39 - 117 U/L 49 48 48  Total Bilirubin 0.2 - 1.2 mg/dL 0.9 0.6 0.9  Bilirubin, Direct 0.0 - 0.3 mg/dL  0.2 0.1 0.2   CBC Latest Ref Rng 12/02/2014 07/17/2014 11/18/2013  WBC 4.0 - 10.5 K/uL 8.3 6.2 9.3  Hemoglobin 13.0 - 17.0 g/dL 17.0 16.5 16.5  Hematocrit 39.0 - 52.0 % 50.4 48.0 49.1  Platelets 150.0 - 400.0 K/uL 122.0(L) 115.0(L) 127.0(L)   Lab Results  Component Value Date   MCV 90.7 12/02/2014   MCV 89.2 07/17/2014   MCV 92.0 11/18/2013    Lab Results  Component Value Date   TSH 3.89 06/04/2015   Lab Results  Component Value Date   HGBA1C 5.8 06/04/2015   Lipid Panel     Component Value Date/Time   CHOL 170 06/04/2015 0811   TRIG 88.0 06/04/2015 0811   HDL 45.80 06/04/2015 0811   CHOLHDL 4 06/04/2015 0811   VLDL 17.6 06/04/2015 0811   LDLCALC 106* 06/04/2015 0811   LDLDIRECT 136.2 08/01/2007 0856    RADIOLOGY: No results found.   ASSESSMENT AND PLAN: David [name] is a young appearing 82 year old gentleman, who is now 73 years status post CABG revascularization surgery and aortic valve replacement with a bioprosthetic aortic valve.  His last catheterization in 2008 continued to show patent grafts and his last nuclear perfusion study remained low risk.  His blood pressure today was minimally elevated on his current dose of lisinopril 5 mg atenolol 25 mg daily.  He rarely takes furosemide which she has available 20 mg as needed for swelling.  I reviewed his echo Doppler study in detail.  Essentially this is not that much different from 2013 and shows a mean gradient of 10 and peak gradient of 19 across his bioprosthetic aortic valve.  He is statin intolerant, but is on Zetia 10 mg for hyperlipidemia.  He will be seeing his primary medical doctor next week for blood work.  I will last that these be sent to my office for my review.  The past he had some issues with vertigo for which he was started on meclizine with improvement in symptoms.  I will see him in one year for cardiology reevaluation or sooner if problems arise.  Time spent: 25 minutes Troy Sine, MD,  [facility] 11/29/2015 5:37 PM

## 2015-12-02 ENCOUNTER — Other Ambulatory Visit (INDEPENDENT_AMBULATORY_CARE_PROVIDER_SITE_OTHER): Payer: Medicare Other

## 2015-12-02 ENCOUNTER — Encounter: Payer: Self-pay | Admitting: Internal Medicine

## 2015-12-02 ENCOUNTER — Ambulatory Visit (INDEPENDENT_AMBULATORY_CARE_PROVIDER_SITE_OTHER): Payer: Medicare Other | Admitting: Internal Medicine

## 2015-12-02 VITALS — BP 130/70 | HR 63 | Ht 70.5 in | Wt 209.0 lb

## 2015-12-02 DIAGNOSIS — M5440 Lumbago with sciatica, unspecified side: Secondary | ICD-10-CM

## 2015-12-02 DIAGNOSIS — I10 Essential (primary) hypertension: Secondary | ICD-10-CM

## 2015-12-02 DIAGNOSIS — Z0000 Encounter for general adult medical examination without abnormal findings: Secondary | ICD-10-CM

## 2015-12-02 DIAGNOSIS — Z0001 Encounter for general adult medical examination with abnormal findings: Secondary | ICD-10-CM | POA: Diagnosis not present

## 2015-12-02 DIAGNOSIS — G8929 Other chronic pain: Secondary | ICD-10-CM

## 2015-12-02 DIAGNOSIS — E785 Hyperlipidemia, unspecified: Secondary | ICD-10-CM

## 2015-12-02 DIAGNOSIS — L259 Unspecified contact dermatitis, unspecified cause: Secondary | ICD-10-CM | POA: Diagnosis not present

## 2015-12-02 DIAGNOSIS — I2510 Atherosclerotic heart disease of native coronary artery without angina pectoris: Secondary | ICD-10-CM

## 2015-12-02 DIAGNOSIS — R7302 Impaired glucose tolerance (oral): Secondary | ICD-10-CM

## 2015-12-02 DIAGNOSIS — N320 Bladder-neck obstruction: Secondary | ICD-10-CM

## 2015-12-02 LAB — LIPID PANEL
CHOLESTEROL: 173 mg/dL (ref 0–200)
HDL: 44.8 mg/dL (ref >39.00)
LDL CALC: 112 mg/dL — AB (ref 0–99)
NonHDL: 128.42
TRIGLYCERIDES: 83 mg/dL (ref 0.0–149.0)
Total CHOL/HDL Ratio: 4
VLDL: 16.6 mg/dL (ref 0.0–40.0)

## 2015-12-02 LAB — CBC WITH DIFFERENTIAL/PLATELET
Basophils Absolute: 0 10*3/uL (ref 0.0–0.1)
Basophils Relative: 0.3 % (ref 0.0–3.0)
EOS ABS: 0.3 10*3/uL (ref 0.0–0.7)
Eosinophils Relative: 3.9 % (ref 0.0–5.0)
HCT: 47 % (ref 39.0–52.0)
HEMOGLOBIN: 15.9 g/dL (ref 13.0–17.0)
Lymphocytes Relative: 39.4 % (ref 12.0–46.0)
Lymphs Abs: 3.1 10*3/uL (ref 0.7–4.0)
MCHC: 33.8 g/dL (ref 30.0–36.0)
MCV: 90.6 fl (ref 78.0–100.0)
MONO ABS: 0.7 10*3/uL (ref 0.1–1.0)
Monocytes Relative: 9.1 % (ref 3.0–12.0)
Neutro Abs: 3.8 10*3/uL (ref 1.4–7.7)
Neutrophils Relative %: 47.3 % (ref 43.0–77.0)
Platelets: 111 10*3/uL — ABNORMAL LOW (ref 150.0–400.0)
RBC: 5.19 Mil/uL (ref 4.22–5.81)
RDW: 13.9 % (ref 11.5–15.5)
WBC: 7.9 10*3/uL (ref 4.0–10.5)

## 2015-12-02 LAB — BASIC METABOLIC PANEL
BUN: 18 mg/dL (ref 6–23)
CO2: 29 meq/L (ref 19–32)
Calcium: 9.4 mg/dL (ref 8.4–10.5)
Chloride: 105 mEq/L (ref 96–112)
Creatinine, Ser: 1.35 mg/dL (ref 0.40–1.50)
GFR: 52.73 mL/min — ABNORMAL LOW (ref >60.00)
Glucose, Bld: 93 mg/dL (ref 70–99)
POTASSIUM: 4.6 meq/L (ref 3.5–5.1)
SODIUM: 140 meq/L (ref 135–145)

## 2015-12-02 LAB — URINALYSIS
Bilirubin Urine: NEGATIVE
HGB URINE DIPSTICK: NEGATIVE
KETONES UR: NEGATIVE
Leukocytes, UA: NEGATIVE
Nitrite: NEGATIVE
SPECIFIC GRAVITY, URINE: 1.025 (ref 1.000–1.030)
TOTAL PROTEIN, URINE-UPE24: NEGATIVE
URINE GLUCOSE: NEGATIVE
UROBILINOGEN UA: 0.2 (ref 0.0–1.0)
pH: 5.5 (ref 5.0–8.0)

## 2015-12-02 LAB — HEPATIC FUNCTION PANEL
ALBUMIN: 4.1 g/dL (ref 3.5–5.2)
ALT: 14 U/L (ref 0–53)
AST: 16 U/L (ref 0–37)
Alkaline Phosphatase: 46 U/L (ref 39–117)
BILIRUBIN TOTAL: 0.7 mg/dL (ref 0.2–1.2)
Bilirubin, Direct: 0.1 mg/dL (ref 0.0–0.3)
Total Protein: 7.2 g/dL (ref 6.0–8.3)

## 2015-12-02 LAB — TSH: TSH: 4.69 u[IU]/mL — AB (ref 0.35–4.50)

## 2015-12-02 LAB — HEMOGLOBIN A1C: HEMOGLOBIN A1C: 5.9 % (ref 4.6–6.5)

## 2015-12-02 LAB — PSA: PSA: 3.11 ng/mL (ref 0.10–4.00)

## 2015-12-02 MED ORDER — DOXYCYCLINE HYCLATE 100 MG PO TABS: 100.0000 mg | ORAL_TABLET | Freq: Two times a day (BID) | ORAL | Status: DC

## 2015-12-02 NOTE — Progress Notes (Signed)
Pre visit review using our clinic review tool, if applicable. No additional management support is needed unless otherwise documented below in the visit note. 

## 2015-12-02 NOTE — Assessment & Plan Note (Signed)
7/17 pustules on neck. Use electric shaver Start Doxy x 10 d

## 2015-12-02 NOTE — Assessment & Plan Note (Signed)
Statin intolerant Atenolol, ASA, Zetia 

## 2015-12-02 NOTE — Assessment & Plan Note (Signed)
MSK - doing well

## 2015-12-02 NOTE — Assessment & Plan Note (Signed)
On Zetia 

## 2015-12-02 NOTE — Progress Notes (Signed)
Subjective:  Patient ID: David [name], male    DOB: 1923-11-12  Age: 82 y.o. MRN: [id number]  CC: No chief complaint on file.   HPI ASTOR DOLCH presents for a well exam F/u CAD, HTN, dyslipidemia. C/o mild cough however he does not want to stop Lisinopril...  Outpatient Prescriptions Prior to Visit  Medication Sig Dispense Refill  . aspirin 81 MG tablet Take 81 mg by mouth daily.      Marland Kitchen atenolol (TENORMIN) 25 MG tablet TAKE ONE TABLET BY MOUTH ONE TIME DAILY 30 tablet 6  . cholecalciferol (VITAMIN D) 1000 UNITS tablet Take 1 tablet (1,000 Units total) by mouth daily. 100 tablet 3  . ezetimibe (ZETIA) 10 MG tablet Take 1 tablet (10 mg total) by mouth daily. 90 tablet 3  . furosemide (LASIX) 20 MG tablet Take 1 tablet (20 mg total) by mouth daily. 30 tablet 11  . lisinopril (PRINIVIL,ZESTRIL) 5 MG tablet TAKE ONE TABLET BY MOUTH DAILY 90 tablet 2  . meclizine (ANTIVERT) 12.5 MG tablet TAKE 1 TABLET 3 TIMES A DAY AS NEEDED DIZZINESS 60 tablet 0   No facility-administered medications prior to visit.    ROS Review of Systems  Constitutional: Negative for appetite change, fatigue and unexpected weight change.  HENT: Negative for congestion, nosebleeds, sneezing, sore throat and trouble swallowing.   Eyes: Negative for itching and visual disturbance.  Respiratory: Negative for cough.   Cardiovascular: Negative for chest pain, palpitations and leg swelling.  Gastrointestinal: Negative for nausea, diarrhea, blood in stool and abdominal distention.  Genitourinary: Negative for frequency and hematuria.  Musculoskeletal: Negative for back pain, joint swelling, gait problem and neck pain.  Skin: Negative for rash.  Neurological: Negative for dizziness, tremors, speech difficulty and weakness.  Psychiatric/Behavioral: Negative for suicidal ideas, sleep disturbance, dysphoric mood and agitation. The patient is not nervous/anxious.     Objective:  BP 130/70 mmHg  Pulse 63  Ht 5' 10.5"  (1.791 m)  Wt 209 lb (94.802 kg)  BMI 29.55 kg/m2  SpO2 96%  BP Readings from Last 3 Encounters:  12/02/15 130/70  11/27/15 153/79  06/04/15 150/70    Wt Readings from Last 3 Encounters:  12/02/15 209 lb (94.802 kg)  11/27/15 209 lb 9.6 oz (95.074 kg)  06/04/15 209 lb (94.802 kg)    Physical Exam  Constitutional: He is oriented to person, place, and time. He appears well-developed. No distress.  NAD  HENT:  Mouth/Throat: Oropharynx is clear and moist.  Eyes: Conjunctivae are normal. Pupils are equal, round, and reactive to light.  Neck: Normal range of motion. No JVD present. No thyromegaly present.  Cardiovascular: Normal rate, regular rhythm, normal heart sounds and intact distal pulses.  Exam reveals no gallop and no friction rub.   No murmur heard. Pulmonary/Chest: Effort normal and breath sounds normal. No respiratory distress. He has no wheezes. He has no rales. He exhibits no tenderness.  Abdominal: Soft. Bowel sounds are normal. He exhibits no distension and no mass. There is no tenderness. There is no rebound and no guarding.  Musculoskeletal: Normal range of motion. He exhibits no edema or tenderness.  Lymphadenopathy:    He has no cervical adenopathy.  Neurological: He is alert and oriented to person, place, and time. He has normal reflexes. No cranial nerve deficit. He exhibits normal muscle tone. He displays a negative Romberg sign. Coordination and gait normal.  Skin: Skin is warm and dry. No rash noted.  Psychiatric: He has a normal mood  and affect. His behavior is normal. Judgment and thought content normal.  Declined rectal Pustular rash in B neck  Lab Results  Component Value Date   WBC 8.3 12/02/2014   HGB 17.0 12/02/2014   HCT 50.4 12/02/2014   PLT 122.0* 12/02/2014   GLUCOSE 98 06/04/2015   CHOL 170 06/04/2015   TRIG 88.0 06/04/2015   HDL 45.80 06/04/2015   LDLDIRECT 136.2 08/01/2007   LDLCALC 106* 06/04/2015   ALT 12 12/02/2014   AST 15  12/02/2014   NA 141 06/04/2015   K 4.4 06/04/2015   CL 105 06/04/2015   CREATININE 1.36 06/04/2015   BUN 16 06/04/2015   CO2 29 06/04/2015   TSH 3.89 06/04/2015   PSA 3.97 12/02/2014   INR 1.0 01/08/2007   HGBA1C 5.8 06/04/2015    No results found.  Assessment & Plan:   There are no diagnoses linked to this encounter. I am having Mr. Kerwick maintain his aspirin, ezetimibe, lisinopril, cholecalciferol, atenolol, meclizine, and furosemide.  No orders of the defined types were placed in this encounter.     Follow-up: No Follow-up on file.  Walker Kehr, MD

## 2015-12-02 NOTE — Assessment & Plan Note (Signed)
Here for medicare wellness/physical  Diet: heart healthy  Physical activity: not sedentary  Depression/mood screen: negative  Hearing: intact to whispered voice  Visual acuity: grossly normal, performs annual eye exam  ADLs: capable  Fall risk: none  Home safety: good  Cognitive evaluation: intact to orientation, naming, recall and repetition  EOL planning: adv directives, full code/ I agree  I have personally reviewed and have noted  1. The patient's medical and social history  2. Their use of alcohol, tobacco or illicit drugs  3. Their current medications and supplements  4. The patient's functional ability including ADL's, fall risks, home safety risks and hearing or visual impairment.  5. Diet and physical activities  6. Evidence for depression or mood disorders 7. Physician roster reviewed    Today patient counseled on age appropriate routine health concerns for screening and prevention, each reviewed and up to date or declined. Immunizations reviewed and up to date or declined. Labs ordered and reviewed. Risk factors for depression reviewed and negative. Hearing function and visual acuity are intact. ADLs screened and addressed as needed. Functional ability and level of safety reviewed and appropriate. Education, counseling and referrals performed based on assessed risks today. Patient provided with a copy of personalized plan for preventive services.      

## 2015-12-02 NOTE — Assessment & Plan Note (Signed)
C/o mild cough however he does not want to stop Lisinopril.Marland KitchenMarland Kitchen

## 2015-12-02 NOTE — Assessment & Plan Note (Signed)
Labs

## 2015-12-12 ENCOUNTER — Other Ambulatory Visit: Payer: Self-pay | Admitting: Internal Medicine

## 2015-12-16 ENCOUNTER — Other Ambulatory Visit: Payer: Self-pay | Admitting: Internal Medicine

## 2016-01-11 ENCOUNTER — Other Ambulatory Visit: Payer: Self-pay | Admitting: Internal Medicine

## 2016-04-13 DIAGNOSIS — H2513 Age-related nuclear cataract, bilateral: Secondary | ICD-10-CM | POA: Diagnosis not present

## 2016-05-12 ENCOUNTER — Encounter: Payer: Self-pay | Admitting: Internal Medicine

## 2016-05-12 ENCOUNTER — Ambulatory Visit (INDEPENDENT_AMBULATORY_CARE_PROVIDER_SITE_OTHER): Payer: Medicare Other | Admitting: Internal Medicine

## 2016-05-12 VITALS — BP 140/70 | HR 81 | Temp 97.7°F | Ht 70.5 in | Wt 204.2 lb

## 2016-05-12 DIAGNOSIS — R7302 Impaired glucose tolerance (oral): Secondary | ICD-10-CM

## 2016-05-12 DIAGNOSIS — I10 Essential (primary) hypertension: Secondary | ICD-10-CM

## 2016-05-12 DIAGNOSIS — I2510 Atherosclerotic heart disease of native coronary artery without angina pectoris: Secondary | ICD-10-CM

## 2016-05-12 DIAGNOSIS — J069 Acute upper respiratory infection, unspecified: Secondary | ICD-10-CM | POA: Insufficient documentation

## 2016-05-12 HISTORY — DX: Acute upper respiratory infection, unspecified: J06.9

## 2016-05-12 MED ORDER — CEPHALEXIN 500 MG PO CAPS: 500.0000 mg | ORAL_CAPSULE | Freq: Four times a day (QID) | ORAL | 0 refills | Status: AC

## 2016-05-12 MED ORDER — HYDROCODONE-HOMATROPINE 5-1.5 MG/5ML PO SYRP: 5.0000 mL | ORAL_SOLUTION | Freq: Four times a day (QID) | ORAL | 0 refills | Status: AC | PRN

## 2016-05-12 NOTE — Assessment & Plan Note (Signed)
Mild to mod, for antibx course,  to f/u any worsening symptoms or concerns 

## 2016-05-12 NOTE — Progress Notes (Signed)
Subjective:    Patient ID: David [name], male    DOB: March 04, 1924, 82 y.o.   MRN: [id number]  HPI   Here with 2-3 days acute onset fever, facial pain, pressure, headache, general weakness and malaise, and greenish d/c, with mild ST and cough, but pt denies chest pain, wheezing, increased sob or doe, orthopnea, PND, increased LE swelling, palpitations, dizziness or syncope.  Pt denies new neurological symptoms such as new headache, or facial or extremity weakness or numbness   Pt denies polydipsia, polyuria Past Medical History:  Diagnosis Date  . CAD (coronary artery disease)    intolerance of statins  . COPD (chronic obstructive pulmonary disease) ([place])   . Current use of long term anticoagulation   . GERD (gastroesophageal reflux disease)   . Hyperlipidemia   . Hypertension   . Impaired glucose tolerance 02/14/2011  . LBP (low back pain)   . OA (osteoarthritis)   . 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   Past Surgical History:  Procedure Laterality Date  . 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 CARDIAC CATHETERIZATION  10/05/1998   Recommend CABG revascularizaition  . CARDIAC CATHETERIZATION  06/11/1999   Continue medical therapy  . CARDIAC CATHETERIZATION  01/08/2007   Patent grafts, recommend medical therapy  . CARDIOVASCULAR STRESS TEST  04/22/2010   Small lateral defect which is partially reversible, no ischemic EKG changes were noted.  Marland Kitchen CAROTID DOPPLER  04/22/2010   Bilateral ICAs-no evidence of diametere reduction, significant tortuosity, or any other vascular abnormality.  . CORONARY ARTERY BYPASS GRAFT  10/09/1998   LIMA to LAD, SVG to diag, SVG sequential to 2 marginals, SVG sequential to PDA & PLA. CE#25 porcine AVR.  Marland Kitchen TRANSTHORACIC ECHOCARDIOGRAM  05/14/2012   EF 50-55%, bioprosthesis of the aortic valve with well preserved LV function, moderate concentric [place]    reports that he quit smoking about 18 years ago. He has quit using smokeless  tobacco. He reports that he does not drink alcohol or use drugs. family history includes Coronary artery disease in his other; Hypertension in his other. Allergies  Allergen Reactions  . Atorvastatin   . Colesevelam   . Simvastatin   . Sulfa Antibiotics    Current Outpatient Prescriptions on File Prior to Visit  Medication Sig Dispense Refill  . aspirin 81 MG tablet Take 81 mg by mouth daily.      Marland Kitchen atenolol (TENORMIN) 25 MG tablet TAKE ONE TABLET BY MOUTH ONE TIME DAILY 30 tablet 11  . ezetimibe (ZETIA) 10 MG tablet Take 1 tablet (10 mg total) by mouth daily. 90 tablet 3  . furosemide (LASIX) 20 MG tablet Take 1 tablet (20 mg total) by mouth daily. 30 tablet 11  . lisinopril (PRINIVIL,ZESTRIL) 5 MG tablet TAKE ONE TABLET BY MOUTH ONCE DAILY 90 tablet 2  . meclizine (ANTIVERT) 12.5 MG tablet TAKE 1 TABLET BY MOUTH 3 TIMES A DAY AS NEEDED FOR DIZZINESS 60 tablet 2   No current facility-administered medications on file prior to visit.    Review of Systems  Constitutional: Negative for unusual diaphoresis or night sweats HENT: Negative for ear swelling or discharge Eyes: Negative for worsening visual haziness  Respiratory: Negative for choking and stridor.   Gastrointestinal: Negative for distension or worsening eructation Genitourinary: Negative for retention or change in urine volume.  Musculoskeletal: Negative for other MSK pain or swelling Skin: Negative for color change and worsening wound Neurological: Negative for tremors and numbness  other than noted  Psychiatric/Behavioral: Negative for decreased concentration or agitation other than above   All other system neg per pt    Objective:   Physical Exam BP 140/70 (BP Location: Left Arm, Patient Position: Sitting, Cuff Size: Normal)   Pulse 81   Temp 97.7 F (36.5 C) (Oral)   Ht 5' 10.5" (1.791 m)   Wt 204 lb 4 oz (92.6 kg)   SpO2 95%   BMI 28.89 kg/m  VS noted,  Constitutional: Pt appears in no apparent distress HENT:  Head: NCAT.  Right Ear: External ear normal.  Left Ear: External ear normal.  Eyes: . Pupils are equal, round, and reactive to light. Conjunctivae and EOM are normal Bilat tm's with mild erythema.  Max sinus areas mild tender.  Pharynx with mild erythema, no exudate Neck: Normal range of motion. Neck supple. with tender submandib LA noted Cardiovascular: Normal rate and regular rhythm.   Pulmonary/Chest: Effort normal and breath sounds decreased without rales or wheezing.  Neurological: Pt is alert. Not confused , motor grossly intact Skin: Skin is warm. No rash, no LE edema Psychiatric: Pt behavior is normal. No agitation.  No other new exam findings    Assessment & Plan:

## 2016-05-12 NOTE — Patient Instructions (Signed)
Please take all new medication as prescribed - the antibiotic, and cough medicine if needed  You can also take Mucinex (or it's generic off brand) for congestion, and tylenol as needed for pain.  Please continue all other medications as before, and refills have been done if requested.  Please have the pharmacy call with any other refills you may need.  Please keep your appointments with your specialists as you may have planned   

## 2016-05-12 NOTE — Assessment & Plan Note (Signed)
stable overall by history and exam, recent data reviewed with pt, and pt to continue medical treatment as before,  to f/u any worsening symptoms or concerns Lab Results  Component Value Date   HGBA1C 5.9 12/02/2015

## 2016-05-12 NOTE — Progress Notes (Signed)
Pre visit review using our clinic review tool, if applicable. No additional management support is needed unless otherwise documented below in the visit note. 

## 2016-05-12 NOTE — Assessment & Plan Note (Signed)
stable overall by history and exam, recent data reviewed with pt, and pt to continue medical treatment as before,  to f/u any worsening symptoms or concerns BP Readings from Last 3 Encounters:  05/12/16 140/70  12/02/15 130/70  11/27/15 (!) 153/79

## 2016-05-17 DIAGNOSIS — H2513 Age-related nuclear cataract, bilateral: Secondary | ICD-10-CM | POA: Diagnosis not present

## 2016-06-06 ENCOUNTER — Ambulatory Visit (INDEPENDENT_AMBULATORY_CARE_PROVIDER_SITE_OTHER): Payer: Medicare Other | Admitting: Internal Medicine

## 2016-06-06 ENCOUNTER — Encounter: Payer: Self-pay | Admitting: Internal Medicine

## 2016-06-06 ENCOUNTER — Other Ambulatory Visit (INDEPENDENT_AMBULATORY_CARE_PROVIDER_SITE_OTHER): Payer: Medicare Other

## 2016-06-06 VITALS — BP 120/70 | HR 67 | Wt 201.0 lb

## 2016-06-06 DIAGNOSIS — G8929 Other chronic pain: Secondary | ICD-10-CM

## 2016-06-06 DIAGNOSIS — I2510 Atherosclerotic heart disease of native coronary artery without angina pectoris: Secondary | ICD-10-CM

## 2016-06-06 DIAGNOSIS — D582 Other hemoglobinopathies: Secondary | ICD-10-CM

## 2016-06-06 DIAGNOSIS — Z952 Presence of prosthetic heart valve: Secondary | ICD-10-CM

## 2016-06-06 DIAGNOSIS — I10 Essential (primary) hypertension: Secondary | ICD-10-CM

## 2016-06-06 DIAGNOSIS — R7302 Impaired glucose tolerance (oral): Secondary | ICD-10-CM

## 2016-06-06 DIAGNOSIS — E785 Hyperlipidemia, unspecified: Secondary | ICD-10-CM

## 2016-06-06 DIAGNOSIS — M5440 Lumbago with sciatica, unspecified side: Secondary | ICD-10-CM

## 2016-06-06 LAB — BASIC METABOLIC PANEL
BUN: 16 mg/dL (ref 6–23)
CHLORIDE: 107 meq/L (ref 96–112)
CO2: 29 mEq/L (ref 19–32)
Calcium: 9.7 mg/dL (ref 8.4–10.5)
Creatinine, Ser: 1.32 mg/dL (ref 0.40–1.50)
GFR: 54.06 mL/min — ABNORMAL LOW (ref >60.00)
Glucose, Bld: 98 mg/dL (ref 70–99)
POTASSIUM: 4.6 meq/L (ref 3.5–5.1)
Sodium: 142 mEq/L (ref 135–145)

## 2016-06-06 LAB — LIPID PANEL
Cholesterol: 170 mg/dL (ref 0–200)
HDL: 48.2 mg/dL (ref >39.00)
LDL Cholesterol: 106 mg/dL — ABNORMAL HIGH (ref 0–99)
NONHDL: 121.34
Total CHOL/HDL Ratio: 4
Triglycerides: 75 mg/dL (ref 0.0–149.0)
VLDL: 15 mg/dL (ref 0.0–40.0)

## 2016-06-06 LAB — CBC WITH DIFFERENTIAL/PLATELET
BASOS ABS: 0 10*3/uL (ref 0.0–0.1)
Basophils Relative: 0.4 % (ref 0.0–3.0)
Eosinophils Absolute: 0.3 10*3/uL (ref 0.0–0.7)
Eosinophils Relative: 3.8 % (ref 0.0–5.0)
HEMATOCRIT: 46.9 % (ref 39.0–52.0)
Hemoglobin: 16 g/dL (ref 13.0–17.0)
LYMPHS ABS: 2.6 10*3/uL (ref 0.7–4.0)
LYMPHS PCT: 35.1 % (ref 12.0–46.0)
MCHC: 34 g/dL (ref 30.0–36.0)
MCV: 90.5 fl (ref 78.0–100.0)
MONOS PCT: 10.6 % (ref 3.0–12.0)
Monocytes Absolute: 0.8 10*3/uL (ref 0.1–1.0)
NEUTROS PCT: 50.1 % (ref 43.0–77.0)
Neutro Abs: 3.6 10*3/uL (ref 1.4–7.7)
Platelets: 93 10*3/uL — ABNORMAL LOW (ref 150.0–400.0)
RBC: 5.18 Mil/uL (ref 4.22–5.81)
RDW: 14.2 % (ref 11.5–15.5)
WBC: 7.3 10*3/uL (ref 4.0–10.5)

## 2016-06-06 LAB — HEPATIC FUNCTION PANEL
ALBUMIN: 3.9 g/dL (ref 3.5–5.2)
ALT: 10 U/L (ref 0–53)
AST: 14 U/L (ref 0–37)
Alkaline Phosphatase: 45 U/L (ref 39–117)
Bilirubin, Direct: 0.1 mg/dL (ref 0.0–0.3)
TOTAL PROTEIN: 7.3 g/dL (ref 6.0–8.3)
Total Bilirubin: 0.7 mg/dL (ref 0.2–1.2)

## 2016-06-06 LAB — HEMOGLOBIN A1C: Hgb A1c MFr Bld: 5.8 % (ref 4.6–6.5)

## 2016-06-06 LAB — TSH: TSH: 2.3 u[IU]/mL (ref 0.35–4.50)

## 2016-06-06 MED ORDER — EZETIMIBE 10 MG PO TABS: 10.0000 mg | ORAL_TABLET | Freq: Every day | ORAL | 3 refills | Status: DC

## 2016-06-06 NOTE — Progress Notes (Signed)
Subjective:  Patient ID: David [name], male    DOB: [birth year]  Age: 83 y.o. MRN: [id number]  CC: No chief complaint on file.   HPI David [name] presents for CAD, HTN, dyslipidemia f/u  Outpatient Medications Prior to Visit  Medication Sig Dispense Refill  . aspirin 81 MG tablet Take 81 mg by mouth daily.      Marland Kitchen atenolol (TENORMIN) 25 MG tablet TAKE ONE TABLET BY MOUTH ONE TIME DAILY 30 tablet 11  . ezetimibe (ZETIA) 10 MG tablet Take 1 tablet (10 mg total) by mouth daily. 90 tablet 3  . furosemide (LASIX) 20 MG tablet Take 1 tablet (20 mg total) by mouth daily. 30 tablet 11  . lisinopril (PRINIVIL,ZESTRIL) 5 MG tablet TAKE ONE TABLET BY MOUTH ONCE DAILY 90 tablet 2  . meclizine (ANTIVERT) 12.5 MG tablet TAKE 1 TABLET BY MOUTH 3 TIMES A DAY AS NEEDED FOR DIZZINESS 60 tablet 2   No facility-administered medications prior to visit.     ROS Review of Systems  Constitutional: Negative for appetite change, fatigue and unexpected weight change.  HENT: Negative for congestion, nosebleeds, sneezing, sore throat and trouble swallowing.   Eyes: Negative for itching and visual disturbance.  Respiratory: Negative for cough.   Cardiovascular: Negative for chest pain, palpitations and leg swelling.  Gastrointestinal: Negative for abdominal distention, blood in stool, diarrhea and nausea.  Genitourinary: Negative for frequency and hematuria.  Musculoskeletal: Positive for arthralgias. Negative for back pain, gait problem, joint swelling and neck pain.  Skin: Negative for rash.  Neurological: Negative for dizziness, tremors, speech difficulty and weakness.  Psychiatric/Behavioral: Negative for agitation, dysphoric mood and sleep disturbance. The patient is not nervous/anxious.     Objective:  BP 120/70   Pulse 67   Wt 201 lb (91.2 kg)   SpO2 96%   BMI 28.43 kg/m   BP Readings from Last 3 Encounters:  06/06/16 120/70  05/12/16 140/70  12/02/15 130/70    Wt Readings from Last 3  Encounters:  06/06/16 201 lb (91.2 kg)  05/12/16 204 lb 4 oz (92.6 kg)  12/02/15 209 lb (94.8 kg)    Physical Exam  Constitutional: He is oriented to person, place, and time. He appears well-developed. No distress.  NAD  HENT:  Mouth/Throat: Oropharynx is clear and moist.  Eyes: Conjunctivae are normal. Pupils are equal, round, and reactive to light.  Neck: Normal range of motion. No JVD present. No thyromegaly present.  Cardiovascular: Normal rate, regular rhythm, normal heart sounds and intact distal pulses.  Exam reveals no gallop and no friction rub.   No murmur heard. Pulmonary/Chest: Effort normal and breath sounds normal. No respiratory distress. He has no wheezes. He has no rales. He exhibits no tenderness.  Abdominal: Soft. Bowel sounds are normal. He exhibits no distension and no mass. There is no tenderness. There is no rebound and no guarding.  Musculoskeletal: Normal range of motion. He exhibits no edema or tenderness.  Lymphadenopathy:    He has no cervical adenopathy.  Neurological: He is alert and oriented to person, place, and time. He has normal reflexes. No cranial nerve deficit. He exhibits normal muscle tone. He displays a negative Romberg sign. Coordination and gait normal.  Skin: Skin is warm and dry. No rash noted.  Psychiatric: He has a normal mood and affect. His behavior is normal. Judgment and thought content normal.    Lab Results  Component Value Date   WBC 7.9 12/02/2015   HGB 15.9 12/02/2015  HCT 47.0 12/02/2015   PLT 111.0 (L) 12/02/2015   GLUCOSE 93 12/02/2015   CHOL 173 12/02/2015   TRIG 83.0 12/02/2015   HDL 44.80 12/02/2015   LDLDIRECT 136.2 08/01/2007   LDLCALC 112 (H) 12/02/2015   ALT 14 12/02/2015   AST 16 12/02/2015   NA 140 12/02/2015   K 4.6 12/02/2015   CL 105 12/02/2015   CREATININE 1.35 12/02/2015   BUN 18 12/02/2015   CO2 29 12/02/2015   TSH 4.69 (H) 12/02/2015   PSA 3.11 12/02/2015   INR 1.0 01/08/2007   HGBA1C 5.9  12/02/2015    No results found.  Assessment & Plan:   There are no diagnoses linked to this encounter. I am having David [name] maintain his aspirin, ezetimibe, furosemide, atenolol, lisinopril, meclizine, ketorolac, ofloxacin, and prednisoLONE acetate.  Meds ordered this encounter  Medications  . ketorolac (ACULAR) 0.4 % SOLN    Sig: INSTILL 1 DROP INTO SURGICAL EYE 4 TIMES DAILY. START WEDNESDAY BEFORE SURGERY. (REPLACING PROLENSA)    Refill:  1  . ofloxacin (OCUFLOX) 0.3 % ophthalmic solution    Sig: INSTILL 1 DROP INTO SURGICAL EYE 4 TIMES DAILY. START WEDNESDAY BEFORE SURGERY (REPLACING BESIVANCE)    Refill:  1  . prednisoLONE acetate (PRED FORTE) 1 % ophthalmic suspension    Sig: INSTILL 1 DROP INTO SURGICAL EYE 4 TIMES DAILY AFTER SURGERY (REPLACING DUREZOL)    Refill:  1     Follow-up: No Follow-up on file.  Walker Kehr, MD

## 2016-06-06 NOTE — Assessment & Plan Note (Signed)
Atenolol, ASA, Zetia 

## 2016-06-06 NOTE — Progress Notes (Signed)
Pre visit review using our clinic review tool, if applicable. No additional management support is needed unless otherwise documented below in the visit note. 

## 2016-06-06 NOTE — Assessment & Plan Note (Signed)
Doing well 

## 2016-06-06 NOTE — Assessment & Plan Note (Signed)
CBC

## 2016-06-06 NOTE — Assessment & Plan Note (Signed)
Labs

## 2016-06-06 NOTE — Assessment & Plan Note (Signed)
Lisinopril, Atenolol, Lasix

## 2016-06-06 NOTE — Assessment & Plan Note (Signed)
Brace prn

## 2016-06-09 DIAGNOSIS — H2511 Age-related nuclear cataract, right eye: Secondary | ICD-10-CM | POA: Diagnosis not present

## 2016-08-07 ENCOUNTER — Other Ambulatory Visit: Payer: Self-pay | Admitting: Internal Medicine

## 2016-09-18 DIAGNOSIS — H2511 Age-related nuclear cataract, right eye: Secondary | ICD-10-CM | POA: Diagnosis not present

## 2016-09-22 DIAGNOSIS — H2512 Age-related nuclear cataract, left eye: Secondary | ICD-10-CM | POA: Diagnosis not present

## 2016-12-02 ENCOUNTER — Other Ambulatory Visit (INDEPENDENT_AMBULATORY_CARE_PROVIDER_SITE_OTHER): Payer: Medicare Other

## 2016-12-02 ENCOUNTER — Encounter: Payer: Self-pay | Admitting: Internal Medicine

## 2016-12-02 ENCOUNTER — Ambulatory Visit (INDEPENDENT_AMBULATORY_CARE_PROVIDER_SITE_OTHER): Payer: Medicare Other | Admitting: Internal Medicine

## 2016-12-02 DIAGNOSIS — Z0000 Encounter for general adult medical examination without abnormal findings: Secondary | ICD-10-CM | POA: Diagnosis not present

## 2016-12-02 DIAGNOSIS — E785 Hyperlipidemia, unspecified: Secondary | ICD-10-CM

## 2016-12-02 DIAGNOSIS — I2510 Atherosclerotic heart disease of native coronary artery without angina pectoris: Secondary | ICD-10-CM

## 2016-12-02 DIAGNOSIS — N320 Bladder-neck obstruction: Secondary | ICD-10-CM

## 2016-12-02 LAB — URINALYSIS
Bilirubin Urine: NEGATIVE
Hgb urine dipstick: NEGATIVE
KETONES UR: NEGATIVE
Leukocytes, UA: NEGATIVE
Nitrite: NEGATIVE
PH: 5.5 (ref 5.0–8.0)
Total Protein, Urine: NEGATIVE
URINE GLUCOSE: NEGATIVE
Urobilinogen, UA: 0.2 (ref 0.0–1.0)

## 2016-12-02 LAB — LIPID PANEL
CHOL/HDL RATIO: 4
Cholesterol: 192 mg/dL (ref 0–200)
HDL: 51.7 mg/dL (ref >39.00)
LDL CALC: 127 mg/dL — AB (ref 0–99)
NONHDL: 140.35
Triglycerides: 66 mg/dL (ref 0.0–149.0)
VLDL: 13.2 mg/dL (ref 0.0–40.0)

## 2016-12-02 LAB — BASIC METABOLIC PANEL
BUN: 18 mg/dL (ref 6–23)
CALCIUM: 9.7 mg/dL (ref 8.4–10.5)
CO2: 30 mEq/L (ref 19–32)
CREATININE: 1.42 mg/dL (ref 0.40–1.50)
Chloride: 105 mEq/L (ref 96–112)
GFR: 49.63 mL/min — AB (ref >60.00)
GLUCOSE: 107 mg/dL — AB (ref 70–99)
Potassium: 4.8 mEq/L (ref 3.5–5.1)
SODIUM: 141 meq/L (ref 135–145)

## 2016-12-02 LAB — CBC WITH DIFFERENTIAL/PLATELET
BASOS ABS: 0 10*3/uL (ref 0.0–0.1)
Basophils Relative: 0.3 % (ref 0.0–3.0)
EOS ABS: 0.3 10*3/uL (ref 0.0–0.7)
Eosinophils Relative: 3.7 % (ref 0.0–5.0)
HCT: 50.7 % (ref 39.0–52.0)
Hemoglobin: 17 g/dL (ref 13.0–17.0)
LYMPHS ABS: 3.1 10*3/uL (ref 0.7–4.0)
Lymphocytes Relative: 42.3 % (ref 12.0–46.0)
MCHC: 33.5 g/dL (ref 30.0–36.0)
MCV: 93.2 fl (ref 78.0–100.0)
MONO ABS: 0.6 10*3/uL (ref 0.1–1.0)
MONOS PCT: 8.5 % (ref 3.0–12.0)
NEUTROS PCT: 45.2 % (ref 43.0–77.0)
Neutro Abs: 3.3 10*3/uL (ref 1.4–7.7)
Platelets: 110 10*3/uL — ABNORMAL LOW (ref 150.0–400.0)
RBC: 5.44 Mil/uL (ref 4.22–5.81)
RDW: 14.1 % (ref 11.5–15.5)
WBC: 7.3 10*3/uL (ref 4.0–10.5)

## 2016-12-02 LAB — TSH: TSH: 3.07 u[IU]/mL (ref 0.35–4.50)

## 2016-12-02 LAB — HEPATIC FUNCTION PANEL
ALK PHOS: 49 U/L (ref 39–117)
ALT: 13 U/L (ref 0–53)
AST: 15 U/L (ref 0–37)
Albumin: 4.3 g/dL (ref 3.5–5.2)
BILIRUBIN DIRECT: 0.2 mg/dL (ref 0.0–0.3)
BILIRUBIN TOTAL: 0.9 mg/dL (ref 0.2–1.2)
Total Protein: 7.4 g/dL (ref 6.0–8.3)

## 2016-12-02 LAB — PSA: PSA: 4.33 ng/mL — AB (ref 0.10–4.00)

## 2016-12-02 NOTE — Assessment & Plan Note (Signed)
Labs

## 2016-12-02 NOTE — Patient Instructions (Signed)

## 2016-12-02 NOTE — Assessment & Plan Note (Addendum)
Here for medicare wellness/physical  Diet: heart healthy  Physical activity: not sedentary  Depression/mood screen: negative  Hearing: intact to whispered voice  Visual acuity: grossly normal, performs annual eye exam;  Reading glasses ADLs: capable  Fall risk: low to none  Home safety: good  Cognitive evaluation: intact to orientation, naming, recall and repetition  EOL planning: adv directives, full code/ I agree  I have personally reviewed and have noted  1. The patient's medical, surgical and social history  2. Their use of alcohol, tobacco or illicit drugs  3. Their current medications and supplements  4. The patient's functional ability including ADL's, fall risks, home safety risks and hearing or visual impairment.  5. Diet and physical activities  6. Evidence for depression or mood disorders 7. The roster of all physicians providing medical care to patient - is listed in the Snapshot section of the chart and reviewed today.    Today patient counseled on age appropriate routine health concerns for screening and prevention, each reviewed and up to date or declined. Immunizations reviewed and up to date or declined. Labs ordered and reviewed. Risk factors for depression reviewed and negative. Hearing function and visual acuity are intact. ADLs screened and addressed as needed. Functional ability and level of safety reviewed and appropriate. Education, counseling and referrals performed based on assessed risks today. Patient provided with a copy of personalized plan for preventive services.   Declined Shingrix

## 2016-12-02 NOTE — Progress Notes (Signed)
Subjective:  Patient ID: David [name], male    DOB: 02/17/1924  Age: 83 y.o. MRN: [id number]  CC: No chief complaint on file.   HPI David [name] presents for a [name] well exam  Outpatient Medications Prior to Visit  Medication Sig Dispense Refill  . aspirin 81 MG tablet Take 81 mg by mouth daily.      Marland Kitchen atenolol (TENORMIN) 25 MG tablet TAKE ONE TABLET BY MOUTH ONE TIME DAILY 30 tablet 11  . ezetimibe (ZETIA) 10 MG tablet Take 1 tablet (10 mg total) by mouth daily. 90 tablet 3  . furosemide (LASIX) 20 MG tablet Take 1 tablet (20 mg total) by mouth daily. 30 tablet 11  . lisinopril (PRINIVIL,ZESTRIL) 5 MG tablet TAKE ONE TABLET BY MOUTH ONCE DAILY 90 tablet 2  . meclizine (ANTIVERT) 12.5 MG tablet TAKE 1 TABLET BY MOUTH 3 TIMES A DAY AS NEEDED FOR DIZZINESS 60 tablet 2  . ketorolac (ACULAR) 0.4 % SOLN INSTILL 1 DROP INTO SURGICAL EYE 4 TIMES DAILY. START WEDNESDAY BEFORE SURGERY. (REPLACING PROLENSA)  1  . ofloxacin (OCUFLOX) 0.3 % ophthalmic solution INSTILL 1 DROP INTO SURGICAL EYE 4 TIMES DAILY. START WEDNESDAY BEFORE SURGERY (REPLACING BESIVANCE)  1  . prednisoLONE acetate (PRED FORTE) 1 % ophthalmic suspension INSTILL 1 DROP INTO SURGICAL EYE 4 TIMES DAILY AFTER SURGERY (REPLACING DUREZOL)  1   No facility-administered medications prior to visit.     ROS Review of Systems  Constitutional: Negative for appetite change, fatigue and unexpected weight change.  HENT: Negative for congestion, nosebleeds, sneezing, sore throat and trouble swallowing.   Eyes: Negative for itching and visual disturbance.  Respiratory: Negative for cough.   Cardiovascular: Negative for chest pain, palpitations and leg swelling.  Gastrointestinal: Negative for abdominal distention, blood in stool, diarrhea and nausea.  Genitourinary: Negative for frequency and hematuria.  Musculoskeletal: Positive for arthralgias. Negative for back pain, gait problem, joint swelling and neck pain.  Skin: Negative for  rash.  Neurological: Negative for dizziness, tremors, speech difficulty and weakness.  Psychiatric/Behavioral: Negative for agitation, dysphoric mood and sleep disturbance. The patient is not nervous/anxious.     Objective:  BP 118/74 (BP Location: Left Arm, Patient Position: Sitting, Cuff Size: Large)   Pulse 67   Temp (!) 97.4 F (36.3 C) (Oral)   Ht 5' 10.5" (1.791 m)   Wt 207 lb (93.9 kg)   SpO2 99%   BMI 29.28 kg/m   BP Readings from Last 3 Encounters:  12/02/16 118/74  06/06/16 120/70  05/12/16 140/70    Wt Readings from Last 3 Encounters:  12/02/16 207 lb (93.9 kg)  06/06/16 201 lb (91.2 kg)  05/12/16 204 lb 4 oz (92.6 kg)    Physical Exam  Constitutional: He is oriented to person, place, and time. He appears well-developed. No distress.  NAD  HENT:  Mouth/Throat: Oropharynx is clear and moist.  Eyes: Pupils are equal, round, and reactive to light. Conjunctivae are normal.  Neck: Normal range of motion. No JVD present. No thyromegaly present.  Cardiovascular: Normal rate, regular rhythm, normal heart sounds and intact distal pulses.  Exam reveals no gallop and no friction rub.   No murmur heard. Pulmonary/Chest: Effort normal and breath sounds normal. No respiratory distress. He has no wheezes. He has no rales. He exhibits no tenderness.  Abdominal: Soft. Bowel sounds are normal. He exhibits no distension and no mass. There is no tenderness. There is no rebound and no guarding.  Musculoskeletal: Normal range  of motion. He exhibits no edema or tenderness.  Lymphadenopathy:    He has no cervical adenopathy.  Neurological: He is alert and oriented to person, place, and time. He has normal reflexes. No cranial nerve deficit. He exhibits normal muscle tone. He displays a negative Romberg sign. Coordination and gait normal.  Skin: Skin is warm and dry. No rash noted.  Psychiatric: He has a normal mood and affect. His behavior is normal. Judgment and thought content  normal.  pt declined rectal exam Obese   Lab Results  Component Value Date   WBC 7.3 06/06/2016   HGB 16.0 06/06/2016   HCT 46.9 06/06/2016   PLT 93.0 (L) 06/06/2016   GLUCOSE 98 06/06/2016   CHOL 170 06/06/2016   TRIG 75.0 06/06/2016   HDL 48.20 06/06/2016   LDLDIRECT 136.2 08/01/2007   LDLCALC 106 (H) 06/06/2016   ALT 10 06/06/2016   AST 14 06/06/2016   NA 142 06/06/2016   K 4.6 06/06/2016   CL 107 06/06/2016   CREATININE 1.32 06/06/2016   BUN 16 06/06/2016   CO2 29 06/06/2016   TSH 2.30 06/06/2016   PSA 3.11 12/02/2015   INR 1.0 01/08/2007   HGBA1C 5.8 06/06/2016    No results found.  Assessment & Plan:   There are no diagnoses linked to this encounter. I have discontinued Mr. Nettleton's ketorolac, ofloxacin, and prednisoLONE acetate. I am also having him maintain his aspirin, furosemide, atenolol, lisinopril, ezetimibe, and meclizine.  No orders of the defined types were placed in this encounter.    Follow-up: No Follow-up on file.  Walker Kehr, MD

## 2016-12-02 NOTE — Assessment & Plan Note (Signed)
Atenolol, ASA, Zetia 

## 2016-12-07 ENCOUNTER — Other Ambulatory Visit: Payer: Self-pay | Admitting: Internal Medicine

## 2017-01-05 ENCOUNTER — Other Ambulatory Visit: Payer: Self-pay | Admitting: Internal Medicine

## 2017-03-18 ENCOUNTER — Other Ambulatory Visit: Payer: Self-pay | Admitting: Internal Medicine

## 2017-06-05 ENCOUNTER — Ambulatory Visit (INDEPENDENT_AMBULATORY_CARE_PROVIDER_SITE_OTHER): Payer: Medicare Other | Admitting: Internal Medicine

## 2017-06-05 ENCOUNTER — Encounter: Payer: Self-pay | Admitting: Internal Medicine

## 2017-06-05 ENCOUNTER — Other Ambulatory Visit (INDEPENDENT_AMBULATORY_CARE_PROVIDER_SITE_OTHER): Payer: Medicare Other

## 2017-06-05 DIAGNOSIS — B079 Viral wart, unspecified: Secondary | ICD-10-CM

## 2017-06-05 DIAGNOSIS — I2510 Atherosclerotic heart disease of native coronary artery without angina pectoris: Secondary | ICD-10-CM | POA: Diagnosis not present

## 2017-06-05 DIAGNOSIS — D582 Other hemoglobinopathies: Secondary | ICD-10-CM | POA: Diagnosis not present

## 2017-06-05 DIAGNOSIS — I10 Essential (primary) hypertension: Secondary | ICD-10-CM

## 2017-06-05 HISTORY — DX: Viral wart, unspecified: B07.9

## 2017-06-05 LAB — CBC WITH DIFFERENTIAL/PLATELET
BASOS ABS: 0 10*3/uL (ref 0.0–0.1)
Basophils Relative: 0.4 % (ref 0.0–3.0)
EOS PCT: 2.7 % (ref 0.0–5.0)
Eosinophils Absolute: 0.2 10*3/uL (ref 0.0–0.7)
HEMATOCRIT: 50.1 % (ref 39.0–52.0)
HEMOGLOBIN: 17.1 g/dL — AB (ref 13.0–17.0)
LYMPHS PCT: 32.2 % (ref 12.0–46.0)
Lymphs Abs: 2.3 10*3/uL (ref 0.7–4.0)
MCHC: 34.1 g/dL (ref 30.0–36.0)
MCV: 92.4 fl (ref 78.0–100.0)
MONOS PCT: 7.9 % (ref 3.0–12.0)
Monocytes Absolute: 0.6 10*3/uL (ref 0.1–1.0)
Neutro Abs: 4 10*3/uL (ref 1.4–7.7)
Neutrophils Relative %: 56.8 % (ref 43.0–77.0)
Platelets: 119 10*3/uL — ABNORMAL LOW (ref 150.0–400.0)
RBC: 5.42 Mil/uL (ref 4.22–5.81)
RDW: 13.8 % (ref 11.5–15.5)
WBC: 7 10*3/uL (ref 4.0–10.5)

## 2017-06-05 LAB — HEPATIC FUNCTION PANEL
ALBUMIN: 4.4 g/dL (ref 3.5–5.2)
ALT: 13 U/L (ref 0–53)
AST: 19 U/L (ref 0–37)
Alkaline Phosphatase: 47 U/L (ref 39–117)
BILIRUBIN DIRECT: 0.1 mg/dL (ref 0.0–0.3)
TOTAL PROTEIN: 7.6 g/dL (ref 6.0–8.3)
Total Bilirubin: 0.9 mg/dL (ref 0.2–1.2)

## 2017-06-05 LAB — BASIC METABOLIC PANEL
BUN: 19 mg/dL (ref 6–23)
CHLORIDE: 104 meq/L (ref 96–112)
CO2: 30 meq/L (ref 19–32)
CREATININE: 1.3 mg/dL (ref 0.40–1.50)
Calcium: 9.6 mg/dL (ref 8.4–10.5)
GFR: 54.9 mL/min — ABNORMAL LOW (ref >60.00)
Glucose, Bld: 129 mg/dL — ABNORMAL HIGH (ref 70–99)
Potassium: 4.4 mEq/L (ref 3.5–5.1)
Sodium: 140 mEq/L (ref 135–145)

## 2017-06-05 LAB — LIPID PANEL
CHOL/HDL RATIO: 4
Cholesterol: 167 mg/dL (ref 0–200)
HDL: 46.4 mg/dL (ref >39.00)
LDL CALC: 106 mg/dL — AB (ref 0–99)
NonHDL: 120.31
TRIGLYCERIDES: 72 mg/dL (ref 0.0–149.0)
VLDL: 14.4 mg/dL (ref 0.0–40.0)

## 2017-06-05 NOTE — Assessment & Plan Note (Signed)
R arm See Cryo

## 2017-06-05 NOTE — Progress Notes (Signed)
Subjective:  Patient ID: David [name], male    DOB: [birth year]  Age: 84 y.o. MRN: [id number]  CC: No chief complaint on file.   HPI David [name] presents for HTN, CAD, vertigo f/u  Outpatient Medications Prior to Visit  Medication Sig Dispense Refill  . aspirin 81 MG tablet Take 81 mg by mouth daily.      Marland Kitchen atenolol (TENORMIN) 25 MG tablet TAKE ONE TABLET BY MOUTH ONE TIME DAILY 30 tablet 10  . ezetimibe (ZETIA) 10 MG tablet Take 1 tablet (10 mg total) by mouth daily. 90 tablet 3  . furosemide (LASIX) 20 MG tablet Take 1 tablet (20 mg total) by mouth daily. 30 tablet 11  . lisinopril (PRINIVIL,ZESTRIL) 5 MG tablet TAKE ONE TABLET BY MOUTH ONCE DAILY 90 tablet 2  . meclizine (ANTIVERT) 12.5 MG tablet TAKE 1 TABLET BY MOUTH 3 TIMES A DAY AS NEEDED FOR DIZZINESS 60 tablet 2   No facility-administered medications prior to visit.     ROS Review of Systems  Constitutional: Negative for appetite change, fatigue and unexpected weight change.  HENT: Negative for congestion, nosebleeds, sneezing, sore throat and trouble swallowing.   Eyes: Negative for itching and visual disturbance.  Respiratory: Negative for cough.   Cardiovascular: Negative for chest pain, palpitations and leg swelling.  Gastrointestinal: Negative for abdominal distention, blood in stool, diarrhea and nausea.  Genitourinary: Negative for frequency and hematuria.  Musculoskeletal: Positive for arthralgias. Negative for back pain, gait problem, joint swelling and neck pain.  Skin: Negative for rash.  Neurological: Negative for dizziness, tremors, speech difficulty and weakness.  Psychiatric/Behavioral: Negative for agitation, dysphoric mood and sleep disturbance. The patient is not nervous/anxious.     Objective:  BP 124/76 (BP Location: Left Arm, Patient Position: Sitting, Cuff Size: Large)   Pulse 79   Temp 98 F (36.7 C) (Oral)   Ht 5' 10.5" (1.791 m)   Wt 204 lb (92.5 kg)   SpO2 99%   BMI 28.86 kg/m    BP Readings from Last 3 Encounters:  06/05/17 124/76  12/02/16 118/74  06/06/16 120/70    Wt Readings from Last 3 Encounters:  06/05/17 204 lb (92.5 kg)  12/02/16 207 lb (93.9 kg)  06/06/16 201 lb (91.2 kg)    Physical Exam  Constitutional: He is oriented to person, place, and time. He appears well-developed. No distress.  NAD  HENT:  Mouth/Throat: Oropharynx is clear and moist.  Eyes: Conjunctivae are normal. Pupils are equal, round, and reactive to light.  Neck: Normal range of motion. No JVD present. No thyromegaly present.  Cardiovascular: Normal rate, regular rhythm, normal heart sounds and intact distal pulses. Exam reveals no gallop and no friction rub.  No murmur heard. Pulmonary/Chest: Effort normal and breath sounds normal. No respiratory distress. He has no wheezes. He has no rales. He exhibits no tenderness.  Abdominal: Soft. Bowel sounds are normal. He exhibits no distension and no mass. There is no tenderness. There is no rebound and no guarding.  Musculoskeletal: Normal range of motion. He exhibits no edema or tenderness.  Lymphadenopathy:    He has no cervical adenopathy.  Neurological: He is alert and oriented to person, place, and time. He has normal reflexes. No cranial nerve deficit. He exhibits normal muscle tone. He displays a negative Romberg sign. Coordination and gait normal.  Skin: Skin is warm and dry. No rash noted.  Psychiatric: He has a normal mood and affect. His behavior is normal. Judgment and thought  content normal.  wart on R arm   Procedure Note :     Procedure : Cryosurgery   Indication:  Wart(s)     Risks including unsuccessful procedure , bleeding, infection, bruising, scar, a need for a repeat  procedure and others were explained to the patient in detail as well as the benefits. Informed consent was obtained verbally.   1  lesion(s)  on  R arm was/were treated with liquid nitrogen on a Q-tip in a usual fasion . Band-Aid was applied  and antibiotic ointment was given for a later use.   Tolerated well. Complications none.   Postprocedure instructions :     Keep the wounds clean. You can wash them with liquid soap and water. Pat dry with gauze or a Kleenex tissue  Before applying antibiotic ointment and a Band-Aid.   You need to report immediately  if  any signs of infection develop.     Lab Results  Component Value Date   WBC 7.3 12/02/2016   HGB 17.0 12/02/2016   HCT 50.7 12/02/2016   PLT 110.0 (L) 12/02/2016   GLUCOSE 107 (H) 12/02/2016   CHOL 192 12/02/2016   TRIG 66.0 12/02/2016   HDL 51.70 12/02/2016   LDLDIRECT 136.2 08/01/2007   LDLCALC 127 (H) 12/02/2016   ALT 13 12/02/2016   AST 15 12/02/2016   NA 141 12/02/2016   K 4.8 12/02/2016   CL 105 12/02/2016   CREATININE 1.42 12/02/2016   BUN 18 12/02/2016   CO2 30 12/02/2016   TSH 3.07 12/02/2016   PSA 4.33 (H) 12/02/2016   INR 1.0 01/08/2007   HGBA1C 5.8 06/06/2016    No results found.  Assessment & Plan:   There are no diagnoses linked to this encounter. I am having David [name]. [name] maintain his aspirin, furosemide, ezetimibe, lisinopril, atenolol, and meclizine.  No orders of the defined types were placed in this encounter.    Follow-up: No Follow-up on file.  Walker Kehr, MD

## 2017-06-05 NOTE — Assessment & Plan Note (Signed)
Atenolol, ASA, Zetia 

## 2017-06-05 NOTE — Patient Instructions (Addendum)
Postprocedure instructions :     Keep the wounds clean. You can wash them with liquid soap and water. Pat dry with gauze or a Kleenex tissue  Before applying antibiotic ointment and a Band-Aid.   You need to report immediately  if  any signs of infection develop.    [facility] well w/labs

## 2017-06-05 NOTE — Assessment & Plan Note (Signed)
Labs

## 2017-08-15 ENCOUNTER — Ambulatory Visit: Payer: Medicare Other | Admitting: Family Medicine

## 2017-08-17 ENCOUNTER — Encounter: Payer: Self-pay | Admitting: Family Medicine

## 2017-08-17 ENCOUNTER — Ambulatory Visit (INDEPENDENT_AMBULATORY_CARE_PROVIDER_SITE_OTHER): Payer: Medicare Other | Admitting: Family Medicine

## 2017-08-17 VITALS — BP 130/76 | HR 60 | Temp 97.9°F | Ht 70.5 in | Wt 207.0 lb

## 2017-08-17 DIAGNOSIS — L570 Actinic keratosis: Secondary | ICD-10-CM

## 2017-08-17 DIAGNOSIS — M25562 Pain in left knee: Secondary | ICD-10-CM | POA: Diagnosis not present

## 2017-08-17 NOTE — Progress Notes (Signed)
David [name] - 84 y.o. male MRN [id number]  Date of birth: Nov 14, 1923  SUBJECTIVE:  Including CC & ROS.  Chief Complaint  Patient presents with  . Nevus  . Left knee pain    David [name] is a 84 y.o. male that is presenting with an enlarging mole. He had the area treated with liquid nitrogen in January. He noticed the area has been increasing in size. Redness and tenderness present.   Left knee pain has been ongoing for one month. Pain is located the medial aspect of his knee. Pain is mild to severe upon flexion and extension. Denies swelling and tenderness.He has been wearing a brace intermitent. Denies surgery or injury. Pain is localized. No locking or giving way       Review of Systems  Constitutional: Negative for fever.  HENT: Negative for congestion.   Musculoskeletal: Positive for arthralgias. Negative for gait problem.  Skin: Negative for color change.  Neurological: Negative for weakness.  Hematological: Negative for adenopathy.  Psychiatric/Behavioral: Negative for agitation.    HISTORY: Past Medical, Surgical, Social, and Family History Reviewed & Updated per EMR.   Pertinent Historical Findings include:  Past Medical History:  Diagnosis Date  . CAD (coronary artery disease)    intolerance of statins  . COPD (chronic obstructive pulmonary disease) ([place])   . Current use of long term anticoagulation   . GERD (gastroesophageal reflux disease)   . Hyperlipidemia   . Hypertension   . Impaired glucose tolerance 02/14/2011  . LBP (low back pain)   . OA (osteoarthritis)   . 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    Past Surgical History:  Procedure Laterality Date  . 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 CARDIAC CATHETERIZATION  10/05/1998   Recommend CABG revascularizaition  . CARDIAC CATHETERIZATION  06/11/1999   Continue medical therapy  . CARDIAC CATHETERIZATION  01/08/2007   Patent grafts, recommend medical therapy  .  CARDIOVASCULAR STRESS TEST  04/22/2010   Small lateral defect which is partially reversible, no ischemic EKG changes were noted.  Marland Kitchen CAROTID DOPPLER  04/22/2010   Bilateral ICAs-no evidence of diametere reduction, significant tortuosity, or any other vascular abnormality.  . CORONARY ARTERY BYPASS GRAFT  10/09/1998   LIMA to LAD, SVG to diag, SVG sequential to 2 marginals, SVG sequential to PDA & PLA. CE#25 porcine AVR.  Marland Kitchen TRANSTHORACIC ECHOCARDIOGRAM  05/14/2012   EF 50-55%, bioprosthesis of the aortic valve with well preserved LV function, moderate concentric [place]    Allergies  Allergen Reactions  . Atorvastatin   . Colesevelam   . Simvastatin   . Sulfa Antibiotics     Family History  Problem Relation Age of Onset  . Coronary artery disease Other   . Hypertension Other      Social History   Socioeconomic History  . Marital status: Married    Spouse name: Not on file  . Number of children: Not on file  . Years of education: Not on file  . Highest education level: Not on file  Occupational History  . Not on file  Social Needs  . Financial resource strain: Not on file  . Food insecurity:    Worry: Not on file    Inability: Not on file  . Transportation needs:    Medical: Not on file    Non-medical: Not on file  Tobacco Use  . Smoking status: Former Smoker    Last attempt to quit: 05/16/1998  Years since quitting: 19.2  . Smokeless tobacco: Former Network engineer and Sexual Activity  . Alcohol use: No  . Drug use: No  . Sexual activity: Never  Lifestyle  . Physical activity:    Days per week: Not on file    Minutes per session: Not on file  . Stress: Not on file  Relationships  . Social connections:    Talks on phone: Not on file    Gets together: Not on file    Attends religious service: Not on file    Active member of club or organization: Not on file    Attends meetings of clubs or organizations: Not on file    Relationship status: Not on file  . Intimate  partner violence:    Fear of current or ex partner: Not on file    Emotionally abused: Not on file    Physically abused: Not on file    Forced sexual activity: Not on file  Other Topics Concern  . Not on file  Social History Narrative  . Not on file     PHYSICAL EXAM:  VS: BP 130/76 (BP Location: Left Arm, Patient Position: Sitting, Cuff Size: Normal)   Pulse 60   Temp 97.9 F (36.6 C) (Oral)   Ht 5' 10.5" (1.791 m)   Wt 207 lb (93.9 kg)   SpO2 100%   BMI 29.28 kg/m  Physical Exam Gen: NAD, alert, cooperative with exam, well-appearing ENT: normal lips, normal nasal mucosa,  Eye: normal EOM, normal conjunctiva and lids CV:  no edema, +2 pedal pulses   Resp: no accessory muscle use, non-labored,  Skin: no rashes, no areas of induration, rough and scaly area on the posterior aspect of his right upper arm.  Neuro: normal tone, normal sensation to touch Psych:  normal insight, alert and oriented MSK:  Left knee:  No obvious effusion  No TTP of the lateral joint line Mild TTP of the medial joint line  Normal flexion and extension of the knee  No instability  No pain with patellar grind  Negative McMurray's test  Neurovascularly intact       ASSESSMENT & PLAN:   Actinic keratoses Occurring on his right upper arm. Has been frozen once and now more painful and enlarging  - referral to Derm   Acute pain of left knee Pain likely related to OA. Pain is intermittent in nature  - counseled on supportive care - counseled on ice and compression  - if no improvement consider imaging and injection

## 2017-08-17 NOTE — Patient Instructions (Signed)
Take tylenol 650 mg three times a day is the best evidence based medicine we have for arthritis.   Glucosamine sulfate 750mg  twice a day is a supplement that has been shown to help moderate to severe arthritis.  Vitamin D 2000 IU daily  Fish oil 2 grams daily.   Tumeric 500mg  twice daily.   Capsaicin topically up to four times a day may also help with pain.  Cortisone injections are an option if these interventions do not seem to make a difference or need more relief.   If cortisone injections do not help, there are different types of shots that may help but they take longer to take effect.  We can discuss this at follow up.    Come back and see me in 3 weeks if you don't have improvement.

## 2017-08-18 DIAGNOSIS — M25562 Pain in left knee: Secondary | ICD-10-CM | POA: Insufficient documentation

## 2017-08-18 NOTE — Assessment & Plan Note (Signed)
Occurring on his right upper arm. Has been frozen once and now more painful and enlarging  - referral to [facility]

## 2017-08-18 NOTE — Assessment & Plan Note (Signed)
Pain likely related to OA. Pain is intermittent in nature  - counseled on supportive care - counseled on ice and compression  - if no improvement consider imaging and injection

## 2017-09-14 ENCOUNTER — Other Ambulatory Visit: Payer: Medicare Other

## 2017-09-14 ENCOUNTER — Ambulatory Visit (INDEPENDENT_AMBULATORY_CARE_PROVIDER_SITE_OTHER): Payer: Medicare Other | Admitting: Internal Medicine

## 2017-09-14 ENCOUNTER — Encounter: Payer: Self-pay | Admitting: Internal Medicine

## 2017-09-14 VITALS — BP 132/76 | HR 71 | Temp 98.2°F | Ht 70.5 in | Wt 211.1 lb

## 2017-09-14 DIAGNOSIS — C44622 Squamous cell carcinoma of skin of right upper limb, including shoulder: Secondary | ICD-10-CM | POA: Diagnosis not present

## 2017-09-14 DIAGNOSIS — L989 Disorder of the skin and subcutaneous tissue, unspecified: Secondary | ICD-10-CM

## 2017-09-14 DIAGNOSIS — C4490 Unspecified malignant neoplasm of skin, unspecified: Secondary | ICD-10-CM

## 2017-09-14 MED ORDER — DOXYCYCLINE HYCLATE 100 MG PO TABS: 100.0000 mg | ORAL_TABLET | Freq: Two times a day (BID) | ORAL | 0 refills | Status: DC

## 2017-09-14 NOTE — Progress Notes (Signed)
   Subjective:  Patient ID: David [name], male    DOB: 03-27-1924  Age: 84 y.o. MRN: [id number]  CC: No chief complaint on file.   HPI David [name] presents for skin bx  Outpatient Medications Prior to Visit  Medication Sig Dispense Refill  . aspirin 81 MG tablet Take 81 mg by mouth daily.      Marland Kitchen atenolol (TENORMIN) 25 MG tablet TAKE ONE TABLET BY MOUTH ONE TIME DAILY 30 tablet 10  . ezetimibe (ZETIA) 10 MG tablet Take 1 tablet (10 mg total) by mouth daily. 90 tablet 3  . lisinopril (PRINIVIL,ZESTRIL) 5 MG tablet TAKE ONE TABLET BY MOUTH ONCE DAILY (Patient taking differently: TAKE ONE half TABLET BY MOUTH ONCE DAILY) 90 tablet 2  . meclizine (ANTIVERT) 12.5 MG tablet TAKE 1 TABLET BY MOUTH 3 TIMES A DAY AS NEEDED FOR DIZZINESS 60 tablet 2  . furosemide (LASIX) 20 MG tablet Take 1 tablet (20 mg total) by mouth daily. 30 tablet 11   No facility-administered medications prior to visit.     ROS Review of Systems  Objective:  BP 132/76 (BP Location: Left Arm, Patient Position: Sitting, Cuff Size: Normal)   Pulse 71   Temp 98.2 F (36.8 C) (Oral)   Ht 5' 10.5" (1.791 m)   Wt 211 lb 1.3 oz (95.7 kg)   SpO2 96%   BMI 29.86 kg/m   BP Readings from Last 3 Encounters:  09/14/17 132/76  08/17/17 130/76  06/05/17 124/76    Wt Readings from Last 3 Encounters:  09/14/17 211 lb 1.3 oz (95.7 kg)  08/17/17 207 lb (93.9 kg)  06/05/17 204 lb (92.5 kg)    Physical Exam    Procedure - Excisional biopsy: Indication: skin cancer Risks including bleeding, infection, incomplete removal and other were discussed. Consent was signed.  Lesion #1 on   measuring 12x10  Mm On R lateral dist arm   Skin over lesion #1  was prepped with Betadine and alcohol  and anesthetized with 5 cc of 2% lidocaine and epinephrine, using a 25-gauge 1 inch needle. Excisional biopsy with a sterile #11 blade was carried out in the usual eliptical  fashion 5 cm - 2 mm free edges. Closed with 5 silk sutures.   Band-Aid was applied with antibiotic ointment.  Tolerated well. Complications - none   Lab Results  Component Value Date   WBC 7.0 06/05/2017   HGB 17.1 (H) 06/05/2017   HCT 50.1 06/05/2017   PLT 119.0 (L) 06/05/2017   GLUCOSE 129 (H) 06/05/2017   CHOL 167 06/05/2017   TRIG 72.0 06/05/2017   HDL 46.40 06/05/2017   LDLDIRECT 136.2 08/01/2007   LDLCALC 106 (H) 06/05/2017   ALT 13 06/05/2017   AST 19 06/05/2017   NA 140 06/05/2017   K 4.4 06/05/2017   CL 104 06/05/2017   CREATININE 1.30 06/05/2017   BUN 19 06/05/2017   CO2 30 06/05/2017   TSH 3.07 12/02/2016   PSA 4.33 (H) 12/02/2016   INR 1.0 01/08/2007   HGBA1C 5.8 06/06/2016    No results found.  Assessment & Plan:   There are no diagnoses linked to this encounter. I am having David [name] maintain his aspirin, furosemide, ezetimibe, lisinopril, atenolol, and meclizine.  No orders of the defined types were placed in this encounter.    Follow-up: No follow-ups on file.  Walker Kehr, MD

## 2017-09-14 NOTE — Patient Instructions (Signed)
Suture removal

## 2017-09-19 ENCOUNTER — Encounter (INDEPENDENT_AMBULATORY_CARE_PROVIDER_SITE_OTHER): Payer: Self-pay

## 2017-09-27 ENCOUNTER — Ambulatory Visit (INDEPENDENT_AMBULATORY_CARE_PROVIDER_SITE_OTHER): Payer: Medicare Other | Admitting: Internal Medicine

## 2017-09-27 ENCOUNTER — Encounter: Payer: Self-pay | Admitting: Internal Medicine

## 2017-09-27 DIAGNOSIS — D485 Neoplasm of uncertain behavior of skin: Secondary | ICD-10-CM

## 2017-09-27 NOTE — Assessment & Plan Note (Signed)
Skin cancer Wound healed Sutures removed

## 2017-09-27 NOTE — Progress Notes (Signed)
   Subjective:  Patient ID: David [name], male    DOB: 07-19-1923  Age: 84 y.o. MRN: [id number]  CC: No chief complaint on file.   HPI David [name] presents for post-op  Outpatient Medications Prior to Visit  Medication Sig Dispense Refill  . aspirin 81 MG tablet Take 81 mg by mouth daily.      Marland Kitchen atenolol (TENORMIN) 25 MG tablet TAKE ONE TABLET BY MOUTH ONE TIME DAILY 30 tablet 10  . doxycycline (VIBRA-TABS) 100 MG tablet Take 1 tablet (100 mg total) by mouth 2 (two) times daily. 10 tablet 0  . ezetimibe (ZETIA) 10 MG tablet Take 1 tablet (10 mg total) by mouth daily. 90 tablet 3  . lisinopril (PRINIVIL,ZESTRIL) 5 MG tablet TAKE ONE TABLET BY MOUTH ONCE DAILY (Patient taking differently: TAKE ONE half TABLET BY MOUTH ONCE DAILY) 90 tablet 2  . meclizine (ANTIVERT) 12.5 MG tablet TAKE 1 TABLET BY MOUTH 3 TIMES A DAY AS NEEDED FOR DIZZINESS 60 tablet 2  . furosemide (LASIX) 20 MG tablet Take 1 tablet (20 mg total) by mouth daily. 30 tablet 11   No facility-administered medications prior to visit.     ROS Review of Systems  Objective:  BP 128/78 (BP Location: Left Arm, Patient Position: Sitting, Cuff Size: Large)   Pulse 61   Temp 98 F (36.7 C) (Oral)   Ht 5' 10.5" (1.791 m)   Wt 210 lb (95.3 kg)   SpO2 100%   BMI 29.71 kg/m   BP Readings from Last 3 Encounters:  09/27/17 128/78  09/14/17 132/76  08/17/17 130/76    Wt Readings from Last 3 Encounters:  09/27/17 210 lb (95.3 kg)  09/14/17 211 lb 1.3 oz (95.7 kg)  08/17/17 207 lb (93.9 kg)    Physical Exam   Wound healed Sutures removed  Lab Results  Component Value Date   WBC 7.0 06/05/2017   HGB 17.1 (H) 06/05/2017   HCT 50.1 06/05/2017   PLT 119.0 (L) 06/05/2017   GLUCOSE 129 (H) 06/05/2017   CHOL 167 06/05/2017   TRIG 72.0 06/05/2017   HDL 46.40 06/05/2017   LDLDIRECT 136.2 08/01/2007   LDLCALC 106 (H) 06/05/2017   ALT 13 06/05/2017   AST 19 06/05/2017   NA 140 06/05/2017   K 4.4 06/05/2017   CL  104 06/05/2017   CREATININE 1.30 06/05/2017   BUN 19 06/05/2017   CO2 30 06/05/2017   TSH 3.07 12/02/2016   PSA 4.33 (H) 12/02/2016   INR 1.0 01/08/2007   HGBA1C 5.8 06/06/2016    No results found.  Assessment & Plan:   There are no diagnoses linked to this encounter. I am having David [name]. David [name] maintain his aspirin, furosemide, ezetimibe, lisinopril, atenolol, meclizine, and doxycycline.  No orders of the defined types were placed in this encounter.    Follow-up: No follow-ups on file.  Walker Kehr, MD

## 2017-10-21 ENCOUNTER — Other Ambulatory Visit: Payer: Self-pay | Admitting: Internal Medicine

## 2017-11-13 ENCOUNTER — Telehealth: Payer: Self-pay | Admitting: Cardiovascular Disease

## 2017-11-13 DIAGNOSIS — Z952 Presence of prosthetic heart valve: Secondary | ICD-10-CM

## 2017-11-13 DIAGNOSIS — I2510 Atherosclerotic heart disease of native coronary artery without angina pectoris: Secondary | ICD-10-CM

## 2017-11-13 NOTE — Telephone Encounter (Signed)
Returned the call to the patient's daughter, per dpr. She would like to know if the patient needs an ECHO prior to a follow up appointment. Last seen 11/2015. Follow up appointment has not been made yet. Message routed to the provider.

## 2017-11-13 NOTE — Telephone Encounter (Signed)
Agree;  recommend follow-up echo Doppler study prior to office visit.

## 2017-11-13 NOTE — Telephone Encounter (Signed)
New Message   Pt's wife calling to see if the pt needs an echo prior to an appointment. Please call

## 2017-11-14 NOTE — Telephone Encounter (Signed)
Left message to call back  Order placed for echo

## 2017-11-14 NOTE — Telephone Encounter (Signed)
Echo scheduled 7/8

## 2017-11-20 ENCOUNTER — Ambulatory Visit (HOSPITAL_COMMUNITY): Payer: Medicare Other | Attending: Cardiology

## 2017-11-24 DIAGNOSIS — Z9849 Cataract extraction status, unspecified eye: Secondary | ICD-10-CM | POA: Diagnosis not present

## 2017-11-25 ENCOUNTER — Other Ambulatory Visit: Payer: Self-pay | Admitting: Internal Medicine

## 2017-12-01 NOTE — Progress Notes (Signed)
Subjective:   David [name] is a 84 y.o. male who presents for Medicare Annual/Subsequent preventive examination.  Review of Systems:  No ROS.  Medicare Wellness Visit. Additional risk factors are reflected in the social history.  Cardiac Risk Factors include: advanced age (>36men, >18 women);dyslipidemia;hypertension;male gender Sleep patterns: feels rested on waking, gets up 1 times nightly to void and sleeps 7-8 hours nightly.    Home Safety/Smoke Alarms: Feels safe in home. Smoke alarms in place.  Living environment; residence and Firearm Safety: 1-story house/ trailer, no firearms. Seat Belt Safety/Bike Helmet: Wears seat belt.   PSA-  Lab Results  Component Value Date   PSA 4.33 (H) 12/02/2016   PSA 3.11 12/02/2015   PSA 3.97 12/02/2014       Objective:    Vitals: BP 134/82   Pulse 79   Temp 97.8 F (36.6 C)   Resp 18   Ht 5\' 11"  (1.803 m)   Wt 209 lb (94.8 kg)   SpO2 98%   BMI 29.15 kg/m   Body mass index is 29.15 kg/m.  Advanced Directives 12/04/2017  Does Patient Have a Medical Advance Directive? Yes  Type of Paramedic of [place];Living will  Copy of [place] in Chart? No - copy requested    Tobacco Social History   Tobacco Use  Smoking Status Former Smoker  . Types: Pipe  . Last attempt to quit: 05/16/1998  . Years since quitting: 19.5  Smokeless Tobacco Former Engineer, structural given: Not Answered  Past Medical History:  Diagnosis Date  . CAD (coronary artery disease)    intolerance of statins  . COPD (chronic obstructive pulmonary disease) ([place])   . Current use of long term anticoagulation   . GERD (gastroesophageal reflux disease)   . Hyperlipidemia   . Hypertension   . Impaired glucose tolerance 02/14/2011  . LBP (low back pain)   . OA (osteoarthritis)   . 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   Past Surgical History:  Procedure Laterality Date  . AORTIC VALVE  REPLACEMENT  01/15/1999   bovine  . CARDIAC CATHETERIZATION  10/05/1998   Recommend CABG revascularizaition  . CARDIAC CATHETERIZATION  06/11/1999   Continue medical therapy  . CARDIAC CATHETERIZATION  01/08/2007   Patent grafts, recommend medical therapy  . CARDIOVASCULAR STRESS TEST  04/22/2010   Small lateral defect which is partially reversible, no ischemic EKG changes were noted.  Marland Kitchen CAROTID DOPPLER  04/22/2010   Bilateral ICAs-no evidence of diametere reduction, significant tortuosity, or any other vascular abnormality.  . CORONARY ARTERY BYPASS GRAFT  10/09/1998   LIMA to LAD, SVG to diag, SVG sequential to 2 marginals, SVG sequential to PDA & PLA. CE#25 porcine AVR.  Marland Kitchen TRANSTHORACIC ECHOCARDIOGRAM  05/14/2012   EF 50-55%, bioprosthesis of the aortic valve with well preserved LV function, moderate concentric [place]   Family History  Problem Relation Age of Onset  . Coronary artery disease Other   . Hypertension Other    Social History   Socioeconomic History  . Marital status: Widowed    Spouse name: Not on file  . Number of children: Not on file  . Years of education: Not on file  . Highest education level: Not on file  Occupational History  . Not on file  Social Needs  . Financial resource strain: Not hard at all  . Food insecurity:    Worry: Never true  Inability: Never true  . Transportation needs:    Medical: No    Non-medical: No  Tobacco Use  . Smoking status: Former Smoker    Types: Pipe    Last attempt to quit: 05/16/1998    Years since quitting: 19.5  . Smokeless tobacco: Former Network engineer and Sexual Activity  . Alcohol use: No  . Drug use: No  . Sexual activity: Never  Lifestyle  . Physical activity:    Days per week: 0 days    Minutes per session: 0 min  . Stress: Not at all  Relationships  . Social connections:    Talks on phone: More than three times a week    Gets together: More than three times a week    Attends religious service: More than  4 times per year    Active member of club or organization: Yes    Attends meetings of clubs or organizations: More than 4 times per year    Relationship status: Widowed  Other Topics Concern  . Not on file  Social History Narrative  . Not on file    Outpatient Encounter Medications as of 12/04/2017  Medication Sig  . aspirin 81 MG tablet Take 81 mg by mouth daily.    Marland Kitchen atenolol (TENORMIN) 25 MG tablet TAKE ONE TABLET BY MOUTH ONE TIME DAILY  . ezetimibe (ZETIA) 10 MG tablet Take 1 tablet (10 mg total) by mouth daily.  Marland Kitchen lisinopril (PRINIVIL,ZESTRIL) 5 MG tablet TAKE ONE TABLET BY MOUTH ONCE DAILY (Patient taking differently: TAKE ONE half TABLET BY MOUTH ONCE DAILY)  . meclizine (ANTIVERT) 12.5 MG tablet TAKE 1 TABLET BY MOUTH 3 TIMES A DAY AS NEEDED FOR DIZZINESS  . furosemide (LASIX) 20 MG tablet Take 1 tablet (20 mg total) by mouth daily.  . [DISCONTINUED] doxycycline (VIBRA-TABS) 100 MG tablet Take 1 tablet (100 mg total) by mouth 2 (two) times daily. (Patient not taking: Reported on 12/04/2017)   No facility-administered encounter medications on file as of 12/04/2017.     Activities of Daily Living In your present state of health, do you have any difficulty performing the following activities: 12/04/2017  Hearing? N  Vision? N  Difficulty concentrating or making decisions? N  Walking or climbing stairs? N  Dressing or bathing? N  Doing errands, shopping? N  Preparing Food and eating ? N  Using the Toilet? N  In the past six months, have you accidently leaked urine? N  Do you have problems with loss of bowel control? N  Managing your Medications? N  Managing your Finances? N  Housekeeping or managing your Housekeeping? N  Some recent data might be hidden    Patient Care Team: Plotnikov, [name], MD as PCP - General Troy Sine, MD as Consulting Physician (Cardiology)   Assessment:   This is a routine wellness examination for David [name]. Physical assessment deferred to  PCP.   Exercise Activities and Dietary recommendations Current Exercise Habits: The patient does not participate in regular exercise at present, Exercise limited by: None identified Diet (meal preparation, eat out, water intake, caffeinated beverages, dairy products, fruits and vegetables): in general, a "healthy" diet  , well balanced   Reviewed heart healthy diet, Encouraged patient to increase daily water and fluid intake.    Goals    None      Fall Risk Fall Risk  12/04/2017 12/02/2016 12/02/2015 07/17/2014  Falls in the past year? No No No No    Depression Screen PHQ  2/9 Scores 12/04/2017 12/02/2016 12/02/2015 07/17/2014  PHQ - 2 Score 0 0 0 0    Cognitive Function MMSE - Mini Mental State Exam 12/04/2017  Orientation to time 5  Orientation to Place 5  Registration 3  Attention/ Calculation 5  Recall 1  Language- name 2 objects 2  Language- repeat 1  Language- follow 3 step command 3  Language- read & follow direction 1  Write a sentence 1  Copy design 1  Total score 28        Immunization History  Administered Date(s) Administered  . Influenza Whole 02/17/2011, 02/14/2012  . Influenza-Unspecified 02/13/2013, 02/12/2014, 02/14/2015  . Pneumococcal Conjugate-13 11/14/2013  . Pneumococcal Polysaccharide-23 03/09/2012  . Td 05/04/2011   Screening Tests Health Maintenance  Topic Date Due  . INFLUENZA VACCINE  12/14/2017  . TETANUS/TDAP  05/03/2021  . PNA vac Low Risk Adult  Completed      Plan:     Continue doing brain stimulating activities (puzzles, reading, adult coloring books, staying active) to keep memory sharp.   Continue to eat heart healthy diet (full of fruits, vegetables, whole grains, lean protein, water--limit salt, fat, and sugar intake) and increase physical activity as tolerated.  I have personally reviewed and noted the following in the patient's chart:   . Medical and social history . Use of alcohol, tobacco or illicit drugs  . Current  medications and supplements . Functional ability and status . Nutritional status . Physical activity . Advanced directives . List of other physicians . Vitals . Screenings to include cognitive, depression, and falls . Referrals and appointments  In addition, I have reviewed and discussed with patient certain preventive protocols, quality metrics, and best practice recommendations. A written personalized care plan for preventive services as well as general preventive health recommendations were provided to patient.     Michiel Cowboy, RN  12/04/2017  Medical screening examination/treatment/procedure(s) were performed by non-physician practitioner and as supervising physician I was immediately available for consultation/collaboration. I agree with above. Lew Dawes, MD

## 2017-12-04 ENCOUNTER — Encounter: Payer: Self-pay | Admitting: Internal Medicine

## 2017-12-04 ENCOUNTER — Ambulatory Visit (INDEPENDENT_AMBULATORY_CARE_PROVIDER_SITE_OTHER): Payer: Medicare Other | Admitting: Internal Medicine

## 2017-12-04 ENCOUNTER — Other Ambulatory Visit (INDEPENDENT_AMBULATORY_CARE_PROVIDER_SITE_OTHER): Payer: Medicare Other

## 2017-12-04 VITALS — BP 134/82 | HR 79 | Temp 97.8°F | Resp 18 | Ht 71.0 in | Wt 209.0 lb

## 2017-12-04 DIAGNOSIS — I10 Essential (primary) hypertension: Secondary | ICD-10-CM | POA: Diagnosis not present

## 2017-12-04 DIAGNOSIS — I2510 Atherosclerotic heart disease of native coronary artery without angina pectoris: Secondary | ICD-10-CM | POA: Diagnosis not present

## 2017-12-04 DIAGNOSIS — E785 Hyperlipidemia, unspecified: Secondary | ICD-10-CM

## 2017-12-04 DIAGNOSIS — D582 Other hemoglobinopathies: Secondary | ICD-10-CM

## 2017-12-04 DIAGNOSIS — Z0000 Encounter for general adult medical examination without abnormal findings: Secondary | ICD-10-CM

## 2017-12-04 DIAGNOSIS — N320 Bladder-neck obstruction: Secondary | ICD-10-CM

## 2017-12-04 LAB — CBC WITH DIFFERENTIAL/PLATELET
BASOS ABS: 0 10*3/uL (ref 0.0–0.1)
BASOS PCT: 0.3 % (ref 0.0–3.0)
Eosinophils Absolute: 0.2 10*3/uL (ref 0.0–0.7)
Eosinophils Relative: 2.2 % (ref 0.0–5.0)
HEMATOCRIT: 48.3 % (ref 39.0–52.0)
Hemoglobin: 16.4 g/dL (ref 13.0–17.0)
LYMPHS PCT: 22.5 % (ref 12.0–46.0)
Lymphs Abs: 1.7 10*3/uL (ref 0.7–4.0)
MCHC: 34.1 g/dL (ref 30.0–36.0)
MCV: 92.4 fl (ref 78.0–100.0)
MONOS PCT: 7.9 % (ref 3.0–12.0)
Monocytes Absolute: 0.6 10*3/uL (ref 0.1–1.0)
NEUTROS ABS: 4.9 10*3/uL (ref 1.4–7.7)
Neutrophils Relative %: 67.1 % (ref 43.0–77.0)
PLATELETS: 95 10*3/uL — AB (ref 150.0–400.0)
RBC: 5.23 Mil/uL (ref 4.22–5.81)
RDW: 14.2 % (ref 11.5–15.5)
WBC: 7.3 10*3/uL (ref 4.0–10.5)

## 2017-12-04 LAB — BASIC METABOLIC PANEL
BUN: 17 mg/dL (ref 6–23)
CALCIUM: 9.3 mg/dL (ref 8.4–10.5)
CHLORIDE: 105 meq/L (ref 96–112)
CO2: 27 meq/L (ref 19–32)
Creatinine, Ser: 1.43 mg/dL (ref 0.40–1.50)
GFR: 49.12 mL/min — ABNORMAL LOW (ref >60.00)
Glucose, Bld: 123 mg/dL — ABNORMAL HIGH (ref 70–99)
POTASSIUM: 4.1 meq/L (ref 3.5–5.1)
SODIUM: 141 meq/L (ref 135–145)

## 2017-12-04 MED ORDER — ZOSTER VAC RECOMB ADJUVANTED 50 MCG/0.5ML IM SUSR: 0.5000 mL | Freq: Once | INTRAMUSCULAR | 1 refills | Status: AC

## 2017-12-04 NOTE — Assessment & Plan Note (Signed)
CBC

## 2017-12-04 NOTE — Assessment & Plan Note (Signed)
Lisinopril, Atenolol and Lasix 

## 2017-12-04 NOTE — Assessment & Plan Note (Signed)
Doing fair 

## 2017-12-04 NOTE — Patient Instructions (Addendum)
Continue doing brain stimulating activities (puzzles, reading, adult coloring books, staying active) to keep memory sharp.   Continue to eat heart healthy diet (full of fruits, vegetables, whole grains, lean protein, water--limit salt, fat, and sugar intake) and increase physical activity as tolerated.   [name] , Thank you for taking time to come for your Medicare Wellness Visit. I appreciate your ongoing commitment to your health goals. Please review the following plan we discussed and let me know if I can assist you in the future.   These are the goals we discussed: Goals    . Patient Stated     Continue to eat well be active physically and socially. Continue to travel in my motor home, enjoy life, family and be active in the [employer].       This is a list of the screening recommended for you and due dates:  Health Maintenance  Topic Date Due  . Flu Shot  12/14/2017  . Tetanus Vaccine  05/03/2021  . Pneumonia vaccines  Completed    Health Maintenance, Male A healthy lifestyle and preventive care is important for your health and wellness. Ask your health care provider about what schedule of regular examinations is right for you. What should I know about weight and diet? Eat a Healthy Diet  Eat plenty of vegetables, fruits, whole grains, low-fat dairy products, and lean protein.  Do not eat a lot of foods high in solid fats, added sugars, or salt.  Maintain a Healthy Weight Regular exercise can help you achieve or maintain a healthy weight. You should:  Do at least 150 minutes of exercise each week. The exercise should increase your heart rate and make you sweat (moderate-intensity exercise).  Do strength-training exercises at least twice a week.  Watch Your Levels of Cholesterol and Blood Lipids  Have your blood tested for lipids and cholesterol every 5 years starting at 82 years of age. If you are at high risk for heart disease, you should start having your blood  tested when you are 82 years old. You may need to have your cholesterol levels checked more often if: ? Your lipid or cholesterol levels are high. ? You are older than 82 years of age. ? You are at high risk for heart disease.  What should I know about cancer screening? Many types of cancers can be detected early and may often be prevented. Lung Cancer  You should be screened every year for lung cancer if: ? You are a current smoker who has smoked for at least 30 years. ? You are a former smoker who has quit within the past 15 years.  Talk to your health care provider about your screening options, when you should start screening, and how often you should be screened.  Colorectal Cancer  Routine colorectal cancer screening usually begins at 81 years of age and should be repeated every 5-10 years until you are 82 years old. You may need to be screened more often if early forms of precancerous polyps or small growths are found. Your health care provider may recommend screening at an earlier age if you have risk factors for colon cancer.  Your health care provider may recommend using home test kits to check for hidden blood in the stool.  A small camera at the end of a tube can be used to examine your colon (sigmoidoscopy or colonoscopy). This checks for the earliest forms of colorectal cancer.  Prostate and Testicular Cancer  Depending on your  age and overall health, your health care provider may do certain tests to screen for prostate and testicular cancer.  Talk to your health care provider about any symptoms or concerns you have about testicular or prostate cancer.  Skin Cancer  Check your skin from head to toe regularly.  Tell your health care provider about any new moles or changes in moles, especially if: ? There is a change in a mole's size, shape, or color. ? You have a mole that is larger than a pencil eraser.  Always use sunscreen. Apply sunscreen liberally and repeat  throughout the day.  Protect yourself by wearing long sleeves, pants, a wide-brimmed hat, and sunglasses when outside.  What should I know about heart disease, diabetes, and high blood pressure?  If you are 86-77 years of age, have your blood pressure checked every 3-5 years. If you are 50 years of age or older, have your blood pressure checked every year. You should have your blood pressure measured twice-once when you are at a hospital or clinic, and once when you are not at a hospital or clinic. Record the average of the two measurements. To check your blood pressure when you are not at a hospital or clinic, you can use: ? An automated blood pressure machine at a pharmacy. ? A home blood pressure monitor.  Talk to your health care provider about your target blood pressure.  If you are between 53-76 years old, ask your health care provider if you should take aspirin to prevent heart disease.  Have regular diabetes screenings by checking your fasting blood sugar level. ? If you are at a normal weight and have a low risk for diabetes, have this test once every three years after the age of 42. ? If you are overweight and have a high risk for diabetes, consider being tested at a younger age or more often.  A one-time screening for abdominal aortic aneurysm (AAA) by ultrasound is recommended for men aged 36-75 years who are current or former smokers. What should I know about preventing infection? Hepatitis B If you have a higher risk for hepatitis B, you should be screened for this virus. Talk with your health care provider to find out if you are at risk for hepatitis B infection. Hepatitis C Blood testing is recommended for:  Everyone born from [birth year] through 1963.  Anyone with known risk factors for hepatitis C.  Sexually Transmitted Diseases (STDs)  You should be screened each year for STDs including gonorrhea and chlamydia if: ? You are sexually active and are younger than 82 years of  age. ? You are older than 82 years of age and your health care provider tells you that you are at risk for this type of infection. ? Your sexual activity has changed since you were last screened and you are at an increased risk for chlamydia or gonorrhea. Ask your health care provider if you are at risk.  Talk with your health care provider about whether you are at high risk of being infected with HIV. Your health care provider may recommend a prescription medicine to help prevent HIV infection.  What else can I do?  Schedule regular health, dental, and eye exams.  Stay current with your vaccines (immunizations).  Do not use any tobacco products, such as cigarettes, chewing tobacco, and e-cigarettes. If you need help quitting, ask your health care provider.  Limit alcohol intake to no more than 2 drinks per day. One drink equals 12 ounces  of beer, 5 ounces of wine, or 1 ounces of hard liquor.  Do not use street drugs.  Do not share needles.  Ask your health care provider for help if you need support or information about quitting drugs.  Tell your health care provider if you often feel depressed.  Tell your health care provider if you have ever been abused or do not feel safe at home. This information is not intended to replace advice given to you by your health care provider. Make sure you discuss any questions you have with your health care provider. Document Released: 10/29/2007 Document Revised: 12/30/2015 Document Reviewed: 02/03/2015 Elsevier Interactive Patient Education  [employer].

## 2017-12-04 NOTE — Assessment & Plan Note (Signed)
Statin intolerant Atenolol, ASA, Zetia

## 2017-12-04 NOTE — Progress Notes (Signed)
Subjective:  Patient ID: David [name], male    DOB: [birth year]  Age: 84 y.o. MRN: [id number]  CC: No chief complaint on file.   HPI David [name] presents for CAD, HTN, dyslipidemia f/u  Outpatient Medications Prior to Visit  Medication Sig Dispense Refill  . aspirin 81 MG tablet Take 81 mg by mouth daily.      Marland Kitchen atenolol (TENORMIN) 25 MG tablet TAKE ONE TABLET BY MOUTH ONE TIME DAILY 90 tablet 3  . ezetimibe (ZETIA) 10 MG tablet Take 1 tablet (10 mg total) by mouth daily. 90 tablet 3  . lisinopril (PRINIVIL,ZESTRIL) 5 MG tablet TAKE ONE TABLET BY MOUTH ONCE DAILY (Patient taking differently: TAKE ONE half TABLET BY MOUTH ONCE DAILY) 90 tablet 2  . meclizine (ANTIVERT) 12.5 MG tablet TAKE 1 TABLET BY MOUTH 3 TIMES A DAY AS NEEDED FOR DIZZINESS 60 tablet 2  . furosemide (LASIX) 20 MG tablet Take 1 tablet (20 mg total) by mouth daily. 30 tablet 11   No facility-administered medications prior to visit.     ROS: Review of Systems  Constitutional: Negative for appetite change, fatigue and unexpected weight change.  HENT: Negative for congestion, nosebleeds, sneezing, sore throat and trouble swallowing.   Eyes: Negative for itching and visual disturbance.  Respiratory: Negative for cough.   Cardiovascular: Negative for chest pain, palpitations and leg swelling.  Gastrointestinal: Negative for abdominal distention, blood in stool, diarrhea and nausea.  Genitourinary: Negative for frequency and hematuria.  Musculoskeletal: Positive for arthralgias. Negative for back pain, gait problem, joint swelling and neck pain.  Skin: Negative for rash.  Neurological: Negative for dizziness, tremors, speech difficulty and weakness.  Psychiatric/Behavioral: Negative for agitation, dysphoric mood and sleep disturbance. The patient is not nervous/anxious.     Objective:  BP 134/82   Pulse 79   Temp 97.8 F (36.6 C) (Oral)   Ht 5\' 11"  (1.803 m)   Wt 209 lb (94.8 kg)   SpO2 98%   BMI 29.15  kg/m   BP Readings from Last 3 Encounters:  12/04/17 134/82  12/04/17 134/82  09/27/17 128/78    Wt Readings from Last 3 Encounters:  12/04/17 209 lb (94.8 kg)  12/04/17 209 lb (94.8 kg)  09/27/17 210 lb (95.3 kg)    Physical Exam  Constitutional: He is oriented to person, place, and time. He appears well-developed. No distress.  NAD  HENT:  Mouth/Throat: Oropharynx is clear and moist.  Eyes: Pupils are equal, round, and reactive to light. Conjunctivae are normal.  Neck: Normal range of motion. No JVD present. No thyromegaly present.  Cardiovascular: Normal rate, regular rhythm, normal heart sounds and intact distal pulses. Exam reveals no gallop and no friction rub.  No murmur heard. Pulmonary/Chest: Effort normal and breath sounds normal. No respiratory distress. He has no wheezes. He has no rales. He exhibits no tenderness.  Abdominal: Soft. Bowel sounds are normal. He exhibits no distension and no mass. There is no tenderness. There is no rebound and no guarding.  Musculoskeletal: Normal range of motion. He exhibits no edema or tenderness.  Lymphadenopathy:    He has no cervical adenopathy.  Neurological: He is alert and oriented to person, place, and time. He has normal reflexes. No cranial nerve deficit. He exhibits normal muscle tone. He displays a negative Romberg sign. Coordination and gait normal.  Skin: Skin is warm and dry. No rash noted.  Psychiatric: He has a normal mood and affect. His behavior is normal. Judgment and  thought content normal.  AKs  Lab Results  Component Value Date   WBC 7.0 06/05/2017   HGB 17.1 (H) 06/05/2017   HCT 50.1 06/05/2017   PLT 119.0 (L) 06/05/2017   GLUCOSE 129 (H) 06/05/2017   CHOL 167 06/05/2017   TRIG 72.0 06/05/2017   HDL 46.40 06/05/2017   LDLDIRECT 136.2 08/01/2007   LDLCALC 106 (H) 06/05/2017   ALT 13 06/05/2017   AST 19 06/05/2017   NA 140 06/05/2017   K 4.4 06/05/2017   CL 104 06/05/2017   CREATININE 1.30  06/05/2017   BUN 19 06/05/2017   CO2 30 06/05/2017   TSH 3.07 12/02/2016   PSA 4.33 (H) 12/02/2016   INR 1.0 01/08/2007   HGBA1C 5.8 06/06/2016    No results found.  Assessment & Plan:   There are no diagnoses linked to this encounter.   No orders of the defined types were placed in this encounter.    Follow-up: No follow-ups on file.  Walker Kehr, MD

## 2017-12-04 NOTE — Assessment & Plan Note (Signed)
Zetia

## 2017-12-06 ENCOUNTER — Encounter (HOSPITAL_COMMUNITY): Payer: Self-pay | Admitting: Radiology

## 2017-12-15 ENCOUNTER — Ambulatory Visit (HOSPITAL_COMMUNITY): Payer: Medicare Other | Attending: Cardiovascular Disease

## 2017-12-15 ENCOUNTER — Other Ambulatory Visit: Payer: Self-pay

## 2017-12-15 DIAGNOSIS — Z952 Presence of prosthetic heart valve: Secondary | ICD-10-CM | POA: Diagnosis not present

## 2017-12-15 DIAGNOSIS — E785 Hyperlipidemia, unspecified: Secondary | ICD-10-CM | POA: Diagnosis not present

## 2017-12-15 DIAGNOSIS — I2510 Atherosclerotic heart disease of native coronary artery without angina pectoris: Secondary | ICD-10-CM | POA: Diagnosis not present

## 2017-12-15 DIAGNOSIS — I10 Essential (primary) hypertension: Secondary | ICD-10-CM | POA: Insufficient documentation

## 2017-12-15 DIAGNOSIS — J449 Chronic obstructive pulmonary disease, unspecified: Secondary | ICD-10-CM | POA: Insufficient documentation

## 2017-12-15 MED ORDER — PERFLUTREN LIPID MICROSPHERE
1.0000 mL | INTRAVENOUS | Status: AC | PRN
  Administered 2017-12-15: 2 mL via INTRAVENOUS

## 2018-02-23 ENCOUNTER — Encounter: Payer: Self-pay | Admitting: Cardiovascular Disease

## 2018-02-23 ENCOUNTER — Ambulatory Visit (INDEPENDENT_AMBULATORY_CARE_PROVIDER_SITE_OTHER): Payer: Medicare Other | Admitting: Cardiovascular Disease

## 2018-02-23 VITALS — BP 130/70 | HR 75 | Ht 70.0 in | Wt 209.2 lb

## 2018-02-23 DIAGNOSIS — Z952 Presence of prosthetic heart valve: Secondary | ICD-10-CM

## 2018-02-23 DIAGNOSIS — I10 Essential (primary) hypertension: Secondary | ICD-10-CM

## 2018-02-23 DIAGNOSIS — I2510 Atherosclerotic heart disease of native coronary artery without angina pectoris: Secondary | ICD-10-CM

## 2018-02-23 DIAGNOSIS — D151 Benign neoplasm of heart: Secondary | ICD-10-CM | POA: Diagnosis not present

## 2018-02-23 DIAGNOSIS — E785 Hyperlipidemia, unspecified: Secondary | ICD-10-CM

## 2018-02-23 MED ORDER — LOSARTAN POTASSIUM 25 MG PO TABS: 25.0000 mg | ORAL_TABLET | Freq: Every day | ORAL | 3 refills | Status: DC

## 2018-02-23 NOTE — Patient Instructions (Signed)
Medication Instructions:  STOP lisinopril START Losartan 25 mg daily  If you need a refill on your cardiac medications before your next appointment, please call your pharmacy.   Testing/Procedures: Your physician has requested that you have an echocardiogram in AUGUST 2020. Echocardiography is a painless test that uses sound waves to create images of your heart. It provides your doctor with information about the size and shape of your heart and how well your heart's chambers and valves are working. This procedure takes approximately one hour. There are no restrictions for this procedure.  This will be done at our [facility] location:  [place]: At [facility], you and your health needs are our priority.  As part of our continuing mission to provide you with exceptional heart care, we have created designated Provider Care Teams.  These Care Teams include your primary Cardiologist (physician) and Advanced Practice Providers (APPs -  Physician Assistants and Nurse Practitioners) who all work together to provide you with the care you need, when you need it. You will need a follow up appointment in August 2020 .  Please call our office 2 months in advance to schedule this appointment.  You may see Dr. Claiborne Billings or one of the following Advanced Practice Providers on your designated Care Team: [place], [place] . Fabian Sharp, PA-C

## 2018-02-23 NOTE — Progress Notes (Signed)
Patient ID: VAIBHAV FOGLEMAN, male   DOB: 1923/09/12, 84 y.o.   MRN: [id number]    Primary M.D. Dr. Alain Marion  PATIENT PROFILE: DEMARUS LATTERELL is a 84 y.o. male who presents to the office to establish cardiology care with me.  He is a former patient of Dr. Rollene Fare.  He establish care with me in June 2015.  I last saw him in July 2017.  He presents for follow-up evaluation.  HPI:  GILVERTO DILEONARDO is a 84 y.o. male who has a history of hyperlipidemia, and valvular heart disease.  In 2000, he underwent CABG revascularization surgery and had a LIMA placed to his LAD, SVG to diagonal vessel, sequential vein graft to 2 marginal branches of his left circumflex artery, and a vein graft to the distal right coronary artery.  At that time, he had a porcine aortic valve replacement for aortic stenosis.  A repeat cardiac catheterization in August 2008, showed significant native CAD, but with patent grafts. A nuclear perfusion study in 2011, remained low risk.  An echo Doppler study in December 2013 showed moderate left ventricular hypertrophy with an ejection fraction of 50-55%.  His aortic bioprosthesis remained competent with suggestion of very mild stenosis.  The peak gradient was 19 mm.  Valve area 1.5 cm.  He's had mild carotid bruits and has undergone carotid studies.  He denies any recent anginal symptoms or palpitations.  He denies any episodes of syncope or presyncope.  He does have a history of hyperlipidemia, for which he has been on Zetia.  He has been intolerant to statins.     He underwent a follow-up echo Doppler study on 11/23/2015.  This showed an ejection fraction at 55-60%.  He did not have regional wall motion abnormalities.  There was grade 2 diastolic dysfunction.  His aortic valve bioprosthesis was well-seated.  His bioprosthetic valve had a peak gradient of 19 mm and mean gradient of 10 mm, which does not appear to be significantly changed from 2013.  There was mitral annular calcification.  His  ascending aorta was upper normal to mildly increased.  There was trivial TR and PR.    Since I last saw him in April 2017 he is continued to do remarkably well.  He underwent a 2-year follow-up echo Doppler study on December 15, 2017.  This revealed moderate LVH with normal systolic function and EF estimated 60 to 65%.  His aortic valve prosthesis was well-seated.  The mean gradient was 12 with a peak gradient of 21, essentially unchanged from previously.  There was trivial AR.  There was a mass present on the ventricular surface of his pulmonic valve.  I reviewed this with Dr. 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  It measured approximately 9 x 8 mm.  Retrospectively it was present also on the echo of 2017.  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  He is followed by Dr. Alain Marion for his primary care.  He has a history of statin intolerance and has been on Zetia 10 mg.  Recently he has noticed a dry nonproductive cough and was concerned that this may be due to his lisinopril.  He presents for follow-up evaluation.  Past Medical History:  Diagnosis Date  . CAD (coronary artery disease)    intolerance of statins  . COPD (chronic obstructive pulmonary disease) ([place])   . Current use of long term anticoagulation   . GERD (gastroesophageal reflux disease)   . Hyperlipidemia   .  Hypertension   . Impaired glucose tolerance 02/14/2011  . LBP (low back pain)   . OA (osteoarthritis)   . 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    Past Surgical History:  Procedure Laterality Date  . 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 CARDIAC CATHETERIZATION  10/05/1998   Recommend CABG revascularizaition  . CARDIAC CATHETERIZATION  06/11/1999   Continue medical therapy  . CARDIAC CATHETERIZATION  01/08/2007   Patent grafts, recommend medical therapy  . CARDIOVASCULAR STRESS TEST  04/22/2010   Small lateral defect which is partially reversible, no ischemic EKG  changes were noted.  Marland Kitchen CAROTID DOPPLER  04/22/2010   Bilateral ICAs-no evidence of diametere reduction, significant tortuosity, or any other vascular abnormality.  . CORONARY ARTERY BYPASS GRAFT  10/09/1998   LIMA to LAD, SVG to diag, SVG sequential to 2 marginals, SVG sequential to PDA & PLA. CE#25 porcine AVR.  Marland Kitchen TRANSTHORACIC ECHOCARDIOGRAM  05/14/2012   EF 50-55%, bioprosthesis of the aortic valve with well preserved LV function, moderate concentric Loa    Allergies  Allergen Reactions  . Atorvastatin   . Colesevelam   . Simvastatin   . Sulfa Antibiotics     Current Outpatient Medications  Medication Sig Dispense Refill  . aspirin 81 MG tablet Take 81 mg by mouth daily.      Marland Kitchen atenolol (TENORMIN) 25 MG tablet TAKE ONE TABLET BY MOUTH ONE TIME DAILY 90 tablet 3  . ezetimibe (ZETIA) 10 MG tablet Take 1 tablet (10 mg total) by mouth daily. 90 tablet 3  . meclizine (ANTIVERT) 12.5 MG tablet TAKE 1 TABLET BY MOUTH 3 TIMES A DAY AS NEEDED FOR DIZZINESS 60 tablet 2  . furosemide (LASIX) 20 MG tablet Take 1 tablet (20 mg total) by mouth daily. 30 tablet 11  . losartan (COZAAR) 25 MG tablet Take 1 tablet (25 mg total) by mouth daily. 90 tablet 3   No current facility-administered medications for this visit.     Socially, he is widowed.  4 children 6 grandchildren, and 12 great-grandchildren.  He does walk.  There is no tobacco use.  Family History  Problem Relation Age of Onset  . Coronary artery disease Other   . Hypertension Other     ROS General: Negative; No fevers, chills, or night sweats HEENT: Negative; No changes in vision or hearing, sinus congestion, difficulty swallowing Pulmonary: Negative; No cough, wheezing, shortness of breath, hemoptysis Cardiovascular:  See HPI; No chest pain, presyncope, syncope, palpitation GI: Positive for ventral hernia; No nausea, vomiting, diarrhea, or abdominal pain GU: Negative; No dysuria, hematuria, or difficulty  voiding Musculoskeletal: Negative; no myalgias, joint pain, or weakness Hematologic/Oncologic: Negative; no easy bruising, bleeding Endocrine: Negative; no heat/cold intolerance; no diabetes Neuro: Negative; no changes in balance, headaches Skin: Negative; No rashes or skin lesions Psychiatric: Negative; No behavioral problems, depression Sleep: Negative; No daytime sleepiness, hypersomnolence, bruxism, restless legs, hypnogagnic hallucinations Other comprehensive 14 point system review is negative   Physical Exam BP 130/70   Pulse 75   Ht '5\' 10"'  (1.778 m)   Wt 209 lb 3.2 oz (94.9 kg)   BMI 30.02 kg/m    Repeat BP by me 140/78  Wt Readings from Last 3 Encounters:  02/23/18 209 lb 3.2 oz (94.9 kg)  12/04/17 209 lb (94.8 kg)  12/04/17 209 lb (94.8 kg)     Physical Exam BP 130/70   Pulse 75   Ht '5\' 10"'  (1.778 m)  Wt 209 lb 3.2 oz (94.9 kg)   BMI 30.02 kg/m  General: Alert, oriented, no distress.  Skin: normal turgor, no rashes, warm and dry HEENT: Normocephalic, atraumatic. Pupils equal round and reactive to light; sclera anicteric; extraocular muscles intact; Fundi ** Nose without nasal septal hypertrophy Mouth/Parynx benign; Mallinpatti scale Neck: No JVD, no carotid bruits; normal carotid upstroke Lungs: clear to ausculatation and percussion; no wheezing or rales Chest wall: without tenderness to palpitation Heart: PMI not displaced, RRR, s1 s2 normal, 2/6 systolic murmur, no appreciable diastolic murmur, no rubs, gallops, thrills, or heaves Abdomen: soft, nontender; no hepatosplenomehaly, BS+; abdominal aorta nontender and not dilated by palpation. Back: no CVA tenderness Pulses 2+ Musculoskeletal: full range of motion, normal strength, no joint deformities Extremities: no clubbing cyanosis or edema, Homan's sign negative  Neurologic: grossly nonfocal; Cranial nerves grossly wnl Psychologic: Normal mood and affect   ECG (independently read by me): Normal sinus  rhythm.  Nonspecific intraventricular block.  No ectopy.  July 2017 ECG (independently read by me): Normal sinus rhythm at 62 bpm.  Left bundle-branch block with repolarization changes.  June 2015 ECG (independently read by me): Sinus rhythm at 70 beats per minute.  Left bundle branch block.  QTc interval 464 ms.  LABS: BMP Latest Ref Rng & Units 12/04/2017 06/05/2017 12/02/2016  Glucose 70 - 99 mg/dL 123(H) 129(H) 107(H)  BUN 6 - 23 mg/dL '17 19 18  ' Creatinine 0.40 - 1.50 mg/dL 1.43 1.30 1.42  Sodium 135 - 145 mEq/L 141 140 141  Potassium 3.5 - 5.1 mEq/L 4.1 4.4 4.8  Chloride 96 - 112 mEq/L 105 104 105  CO2 19 - 32 mEq/L '27 30 30  ' Calcium 8.4 - 10.5 mg/dL 9.3 9.6 9.7   Hepatic Function Latest Ref Rng & Units 06/05/2017 12/02/2016 06/06/2016  Total Protein 6.0 - 8.3 g/dL 7.6 7.4 7.3  Albumin 3.5 - 5.2 g/dL 4.4 4.3 3.9  AST 0 - 37 U/L '19 15 14  ' ALT 0 - 53 U/L '13 13 10  ' Alk Phosphatase 39 - 117 U/L 47 49 45  Total Bilirubin 0.2 - 1.2 mg/dL 0.9 0.9 0.7  Bilirubin, Direct 0.0 - 0.3 mg/dL 0.1 0.2 0.1   CBC Latest Ref Rng & Units 12/04/2017 06/05/2017 12/02/2016  WBC 4.0 - 10.5 K/uL 7.3 7.0 7.3  Hemoglobin 13.0 - 17.0 g/dL 16.4 17.1(H) 17.0  Hematocrit 39.0 - 52.0 % 48.3 50.1 50.7  Platelets 150.0 - 400.0 K/uL 95.0(L) 119.0(L) 110.0(L)   Lab Results  Component Value Date   MCV 92.4 12/04/2017   MCV 92.4 06/05/2017   MCV 93.2 12/02/2016    Lab Results  Component Value Date   TSH 3.07 12/02/2016   Lab Results  Component Value Date   HGBA1C 5.8 06/06/2016   Lipid Panel     Component Value Date/Time   CHOL 167 06/05/2017 0908   TRIG 72.0 06/05/2017 0908   HDL 46.40 06/05/2017 0908   CHOLHDL 4 06/05/2017 0908   VLDL 14.4 06/05/2017 0908   LDLCALC 106 (H) 06/05/2017 0908   LDLDIRECT 136.2 08/01/2007 0856    RADIOLOGY: No results found.  IMPRESSION: 1. S/P AVR (aortic valve replacement)   2. CAD in native artery: CABG 2000   3. Essential hypertension   4. Papillary  fibroelastoma   5. Hyperlipidemia with target LDL less than 70      ASSESSMENT AND PLAN: Mr. Travious Vanover is a young appearing 84 year old gentleman who is 19 years since undergoing CABG revascularization surgery and  aortic valve replacement with a bioprosthetic aortic valve.  At his last catheterization in 2008 he had patent grafts.  His last nuclear study was low risk.  He is asymptomatic with reference to anginal symptomatology.  He denies any PND orthopnea.  He denies any exertional dyspnea.  There are no episodes of presyncope or syncope.  I reviewed his most recent echo Doppler with him in detail.  His LV function remains normal with moderate LVH.  His aortic bioprosthesis is stable with similar gradients compared to 2 years previously.  He does have a mass on the ventricular surface of his pulmonic valve.  I reviewed this with Dr. Sallyanne Kuster and also today in the office reviewed this with Dr. Oval Linsey.  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  Patient is asymptomatic.  Retrospectively, most likely this was also present on the 2017 echo.  He has a history of hypertension.  He also has noticed a dry cough most likely due to lisinopril.  As result I will discontinue lisinopril and start him on low-dose losartan at 25 mg daily and he will continue his furosemide which has helped his previous edema and will continue his atenolol 25 mg daily.  He has a follow-up appointment to see Dr. Alain Marion in January.  If his blood pressure is elevated greater than 140 I would recommend further titration of losartan to 50 mg.  He is currently on Zetia 10 mg for hyperlipidemia.  In January 2019 LDL was 106.  He did not tolerate statin therapy in the past.  His edema has resolved with furosemide.  He remains fairly active.  I have recommended that follow-up laboratory be obtained in January when he sees Dr. Alain Marion.  In August 2020 he will undergo a one-year follow-up echo Doppler evaluation  and I will see him in follow-up shortly thereafter.  Time spent ; 25 minutess Troy Sine, MD, [facility] 02/23/2018 9:25 AM

## 2018-05-29 ENCOUNTER — Other Ambulatory Visit: Payer: Self-pay | Admitting: Internal Medicine

## 2018-06-06 ENCOUNTER — Ambulatory Visit (INDEPENDENT_AMBULATORY_CARE_PROVIDER_SITE_OTHER): Payer: Medicare Other | Admitting: Internal Medicine

## 2018-06-06 ENCOUNTER — Encounter: Payer: Self-pay | Admitting: Internal Medicine

## 2018-06-06 VITALS — BP 134/68 | HR 69 | Temp 97.4°F | Ht 70.0 in | Wt 218.0 lb

## 2018-06-06 DIAGNOSIS — G8929 Other chronic pain: Secondary | ICD-10-CM | POA: Diagnosis not present

## 2018-06-06 DIAGNOSIS — I10 Essential (primary) hypertension: Secondary | ICD-10-CM | POA: Diagnosis not present

## 2018-06-06 DIAGNOSIS — R972 Elevated prostate specific antigen [PSA]: Secondary | ICD-10-CM

## 2018-06-06 DIAGNOSIS — E785 Hyperlipidemia, unspecified: Secondary | ICD-10-CM | POA: Diagnosis not present

## 2018-06-06 DIAGNOSIS — M5440 Lumbago with sciatica, unspecified side: Secondary | ICD-10-CM

## 2018-06-06 DIAGNOSIS — B351 Tinea unguium: Secondary | ICD-10-CM | POA: Insufficient documentation

## 2018-06-06 MED ORDER — CICLOPIROX 8 % EX SOLN: Freq: Every day | CUTANEOUS | 0 refills | Status: DC

## 2018-06-06 NOTE — Patient Instructions (Addendum)
If you have medicare related insurance (such as traditional Medicare, [employer], [employer], or similar), Please make an appointment at the scheduling desk with Sharee Pimple, the [employer], for your Wellness visit in this office, which is a benefit with your insurance.  Fungal Nail Infection A fungal nail infection is a common infection of the toenails or fingernails. This condition affects toenails more often than fingernails. It often affects the great, or big, toes. More than one nail may be infected. The condition can be passed from person to person (is contagious). What are the causes? This condition is caused by a fungus. Several types of fungi can cause the infection. These fungi are common in moist and warm areas. If your hands or feet come into contact with the fungus, it may get into a crack in your fingernail or toenail and cause the infection. What increases the risk? The following factors may make you more likely to develop this condition:  Being male.  Being of older age.  Living with someone who has the fungus.  Walking barefoot in areas where the fungus thrives, such as showers or locker rooms.  Wearing shoes and socks that cause your feet to sweat.  Having a nail injury or a recent nail surgery.  Having certain medical conditions, such as: ? Athlete's foot. ? Diabetes. ? Psoriasis. ? Poor circulation. ? A weak body defense system (immune system). What are the signs or symptoms? Symptoms of this condition include:  A pale spot on the nail.  Thickening of the nail.  A nail that becomes yellow or brown.  A brittle or ragged nail edge.  A crumbling nail.  A nail that has lifted away from the nail bed. How is this diagnosed? This condition is diagnosed with a physical exam. Your health care provider may take a scraping or clipping from your nail to test for the fungus. How is this treated? Treatment is not needed for mild  infections. If you have significant nail changes, treatment may include:  Antifungal medicines taken by mouth (orally). You may need to take the medicine for several weeks or several months, and you may not see the results for a long time. These medicines can cause side effects. Ask your health care provider what problems to watch for.  Antifungal nail polish or nail cream. These may be used along with oral antifungal medicines.  Laser treatment of the nail.  Surgery to remove the nail. This may be needed for the most severe infections. It can take a long time, usually up to a year, for the infection to go away. The infection may also come back. Follow these instructions at home: Medicines  Take or apply over-the-counter and prescription medicines only as told by your health care provider.  Ask your health care provider about using over-the-counter mentholated ointment on your nails. Nail care  Trim your nails often.  Wash and dry your hands and feet every day.  Keep your feet dry: ? Wear absorbent socks, and change your socks frequently. ? Wear shoes that allow air to circulate, such as sandals or canvas tennis shoes. Throw out old shoes.  Do not use artificial nails.  If you go to a nail salon, make sure you choose one that uses clean instruments.  Use antifungal foot powder on your feet and in your shoes. General instructions  Do not share personal items, such as towels or nail clippers.  Do not walk barefoot in shower rooms or locker  rooms.  Wear rubber gloves if you are working with your hands in wet areas.  Keep all follow-up visits as told by your health care provider. This is important. Contact a health care provider if: Your infection is not getting better or it is getting worse after several months. Summary  A fungal nail infection is a common infection of the toenails or fingernails.  Treatment is not needed for mild infections. If you have significant nail  changes, treatment may include taking medicine orally and applying medicine to your nails.  It can take a long time, usually up to a year, for the infection to go away. The infection may also come back.  Take or apply over-the-counter and prescription medicines only as told by your health care provider.  Follow instructions for taking care of your nails to help prevent infection from coming back or spreading. This information is not intended to replace advice given to you by your health care provider. Make sure you discuss any questions you have with your health care provider. Document Released: 04/29/2000 Document Revised: 10/06/2017 Document Reviewed: 10/06/2017 Elsevier Interactive Patient Education  2019 [employer].

## 2018-06-06 NOTE — Assessment & Plan Note (Signed)
[place]

## 2018-06-06 NOTE — Assessment & Plan Note (Signed)
Lisinopril, Atenolol and Lasix ,  Lisinopril.Marland KitchenMarland Kitchen

## 2018-06-06 NOTE — Assessment & Plan Note (Signed)
Zetia

## 2018-06-06 NOTE — Progress Notes (Signed)
Subjective:  Patient ID: David [name], male    DOB: 1923-09-16  Age: 85 y.o. MRN: [id number]  CC: No chief complaint on file.   HPI David [name] presents for CAD, HTN, dyslipidemia f/u  Outpatient Medications Prior to Visit  Medication Sig Dispense Refill  . aspirin 81 MG tablet Take 81 mg by mouth daily.      Marland Kitchen atenolol (TENORMIN) 25 MG tablet TAKE ONE TABLET BY MOUTH ONE TIME DAILY 90 tablet 3  . ezetimibe (ZETIA) 10 MG tablet Take 1 tablet (10 mg total) by mouth daily. 90 tablet 3  . meclizine (ANTIVERT) 12.5 MG tablet TAKE 1 TABLET BY MOUTH 3 TIMES A DAY AS NEEDED FOR DIZZINESS 60 tablet 2  . furosemide (LASIX) 20 MG tablet Take 1 tablet (20 mg total) by mouth daily. 30 tablet 11  . losartan (COZAAR) 25 MG tablet Take 1 tablet (25 mg total) by mouth daily. 90 tablet 3   No facility-administered medications prior to visit.     ROS: Review of Systems  Constitutional: Negative for appetite change, fatigue and unexpected weight change.  HENT: Negative for congestion, nosebleeds, sneezing, sore throat and trouble swallowing.   Eyes: Negative for itching and visual disturbance.  Respiratory: Negative for cough.   Cardiovascular: Negative for chest pain, palpitations and leg swelling.  Gastrointestinal: Negative for abdominal distention, blood in stool, diarrhea and nausea.  Genitourinary: Negative for frequency and hematuria.  Musculoskeletal: Positive for arthralgias. Negative for back pain, gait problem, joint swelling and neck pain.  Skin: Negative for rash.  Neurological: Negative for dizziness, tremors, speech difficulty and weakness.  Psychiatric/Behavioral: Negative for agitation, dysphoric mood, sleep disturbance and suicidal ideas. The patient is not nervous/anxious.     Objective:  BP 134/68 (BP Location: Right Arm, Patient Position: Sitting, Cuff Size: Large)   Pulse 69   Temp (!) 97.4 F (36.3 C) (Oral)   Ht 5\' 10"  (1.778 m)   Wt 218 lb (98.9 kg)   SpO2 95%    BMI 31.28 kg/m   BP Readings from Last 3 Encounters:  06/06/18 134/68  02/23/18 130/70  12/04/17 134/82    Wt Readings from Last 3 Encounters:  06/06/18 218 lb (98.9 kg)  02/23/18 209 lb 3.2 oz (94.9 kg)  12/04/17 209 lb (94.8 kg)    Physical Exam Constitutional:      General: He is not in acute distress.    Appearance: He is well-developed.     Comments: NAD  Eyes:     Conjunctiva/sclera: Conjunctivae normal.     Pupils: Pupils are equal, round, and reactive to light.  Neck:     Musculoskeletal: Normal range of motion.     Thyroid: No thyromegaly.     Vascular: No JVD.  Cardiovascular:     Rate and Rhythm: Normal rate and regular rhythm.     Heart sounds: Normal heart sounds. No murmur. No friction rub. No gallop.   Pulmonary:     Effort: Pulmonary effort is normal. No respiratory distress.     Breath sounds: Normal breath sounds. No wheezing or rales.  Chest:     Chest wall: No tenderness.  Abdominal:     General: Bowel sounds are normal. There is no distension.     Palpations: Abdomen is soft. There is no mass.     Tenderness: There is no abdominal tenderness. There is no guarding or rebound.  Musculoskeletal: Normal range of motion.        General: No  tenderness.  Lymphadenopathy:     Cervical: No cervical adenopathy.  Skin:    General: Skin is warm and dry.     Findings: No rash.  Neurological:     Mental Status: He is alert and oriented to person, place, and time.     Cranial Nerves: No cranial nerve deficit.     Motor: No abnormal muscle tone.     Coordination: Coordination normal.     Gait: Gait normal.     Deep Tendon Reflexes: Reflexes are normal and symmetric.  Psychiatric:        Behavior: Behavior normal.        Thought Content: Thought content normal.        Judgment: Judgment normal.   onychomycosis  Lab Results  Component Value Date   WBC 7.3 12/04/2017   HGB 16.4 12/04/2017   HCT 48.3 12/04/2017   PLT 95.0 (L) 12/04/2017   GLUCOSE  123 (H) 12/04/2017   CHOL 167 06/05/2017   TRIG 72.0 06/05/2017   HDL 46.40 06/05/2017   LDLDIRECT 136.2 08/01/2007   LDLCALC 106 (H) 06/05/2017   ALT 13 06/05/2017   AST 19 06/05/2017   NA 141 12/04/2017   K 4.1 12/04/2017   CL 105 12/04/2017   CREATININE 1.43 12/04/2017   BUN 17 12/04/2017   CO2 27 12/04/2017   TSH 3.07 12/02/2016   PSA 4.33 (H) 12/02/2016   INR 1.0 01/08/2007   HGBA1C 5.8 06/06/2016    No results found.  Assessment & Plan:   There are no diagnoses linked to this encounter.   No orders of the defined types were placed in this encounter.    Follow-up: No follow-ups on file.  Walker Kehr, MD

## 2018-06-06 NOTE — Assessment & Plan Note (Signed)
Tylenol prn 

## 2018-06-07 ENCOUNTER — Other Ambulatory Visit (INDEPENDENT_AMBULATORY_CARE_PROVIDER_SITE_OTHER): Payer: Medicare Other

## 2018-06-07 DIAGNOSIS — I10 Essential (primary) hypertension: Secondary | ICD-10-CM | POA: Diagnosis not present

## 2018-06-07 DIAGNOSIS — M5440 Lumbago with sciatica, unspecified side: Secondary | ICD-10-CM | POA: Diagnosis not present

## 2018-06-07 DIAGNOSIS — B351 Tinea unguium: Secondary | ICD-10-CM

## 2018-06-07 DIAGNOSIS — R972 Elevated prostate specific antigen [PSA]: Secondary | ICD-10-CM

## 2018-06-07 DIAGNOSIS — G8929 Other chronic pain: Secondary | ICD-10-CM | POA: Diagnosis not present

## 2018-06-07 DIAGNOSIS — E785 Hyperlipidemia, unspecified: Secondary | ICD-10-CM

## 2018-06-07 LAB — CBC WITH DIFFERENTIAL/PLATELET
Basophils Absolute: 0 10*3/uL (ref 0.0–0.1)
Basophils Relative: 0.4 % (ref 0.0–3.0)
EOS PCT: 3.5 % (ref 0.0–5.0)
Eosinophils Absolute: 0.2 10*3/uL (ref 0.0–0.7)
HCT: 45.7 % (ref 39.0–52.0)
Hemoglobin: 15.6 g/dL (ref 13.0–17.0)
Lymphocytes Relative: 35.2 % (ref 12.0–46.0)
Lymphs Abs: 2.3 10*3/uL (ref 0.7–4.0)
MCHC: 34.2 g/dL (ref 30.0–36.0)
MCV: 91.6 fl (ref 78.0–100.0)
Monocytes Absolute: 0.5 10*3/uL (ref 0.1–1.0)
Monocytes Relative: 8.1 % (ref 3.0–12.0)
Neutro Abs: 3.4 10*3/uL (ref 1.4–7.7)
Neutrophils Relative %: 52.8 % (ref 43.0–77.0)
PLATELETS: 105 10*3/uL — AB (ref 150.0–400.0)
RBC: 4.99 Mil/uL (ref 4.22–5.81)
RDW: 13.9 % (ref 11.5–15.5)
WBC: 6.4 10*3/uL (ref 4.0–10.5)

## 2018-06-07 LAB — PSA: PSA: 3.49 ng/mL (ref 0.10–4.00)

## 2018-06-07 LAB — HEPATIC FUNCTION PANEL
ALBUMIN: 4 g/dL (ref 3.5–5.2)
ALT: 13 U/L (ref 0–53)
AST: 17 U/L (ref 0–37)
Alkaline Phosphatase: 43 U/L (ref 39–117)
Bilirubin, Direct: 0.2 mg/dL (ref 0.0–0.3)
Total Bilirubin: 1 mg/dL (ref 0.2–1.2)
Total Protein: 6.9 g/dL (ref 6.0–8.3)

## 2018-06-07 LAB — BASIC METABOLIC PANEL
BUN: 16 mg/dL (ref 6–23)
CO2: 27 mEq/L (ref 19–32)
Calcium: 9.3 mg/dL (ref 8.4–10.5)
Chloride: 106 mEq/L (ref 96–112)
Creatinine, Ser: 1.32 mg/dL (ref 0.40–1.50)
GFR: 50.64 mL/min — ABNORMAL LOW (ref >60.00)
Glucose, Bld: 103 mg/dL — ABNORMAL HIGH (ref 70–99)
POTASSIUM: 4 meq/L (ref 3.5–5.1)
Sodium: 142 mEq/L (ref 135–145)

## 2018-06-07 LAB — LIPID PANEL
Cholesterol: 169 mg/dL (ref 0–200)
HDL: 45.4 mg/dL (ref >39.00)
LDL Cholesterol: 109 mg/dL — ABNORMAL HIGH (ref 0–99)
NonHDL: 123.94
Total CHOL/HDL Ratio: 4
Triglycerides: 77 mg/dL (ref 0.0–149.0)
VLDL: 15.4 mg/dL (ref 0.0–40.0)

## 2018-06-07 LAB — TSH: TSH: 2.65 u[IU]/mL (ref 0.35–4.50)

## 2018-06-29 ENCOUNTER — Ambulatory Visit (INDEPENDENT_AMBULATORY_CARE_PROVIDER_SITE_OTHER): Payer: Medicare Other | Admitting: Family Medicine

## 2018-06-29 ENCOUNTER — Encounter: Payer: Self-pay | Admitting: Family Medicine

## 2018-06-29 VITALS — BP 150/70 | HR 72 | Temp 98.0°F | Ht 70.0 in | Wt 211.2 lb

## 2018-06-29 DIAGNOSIS — M545 Low back pain, unspecified: Secondary | ICD-10-CM

## 2018-06-29 MED ORDER — PREDNISONE 5 MG PO TABS: ORAL_TABLET | ORAL | 0 refills | Status: DC

## 2018-06-29 NOTE — Patient Instructions (Signed)
Nice to meet you. Please try like heat the area. Please try massage on the area as well. Please try Aspercreme with lidocaine or salonpas on the area. Please try the exercises and using a back brace if it feels better. Please follow-up with me in 2 to 3 weeks if the symptoms or not improving.

## 2018-06-29 NOTE — Progress Notes (Signed)
David [name] - 85 y.o. male MRN [id number]  Date of birth: 1923/10/21  SUBJECTIVE:  Including CC & ROS.  No chief complaint on file.   David [name] is a 85 y.o. male that is presenting with left lower back pain.  The pain is been ongoing for a few weeks.  Denies any inciting event.  He has had this happen before.  Denies any radicular symptoms.  He has a history of back pain that he usually maintains with exercises.  He has not been doing his exercises like he normally does.  Pain is sharp and throbbing in nature.  Has not had any improvement with home modalities.  Pain is intermittent.  Pain is worse after seated for prolonged period of time or when lying down..    Review of Systems  Constitutional: Negative for fever.  HENT: Negative for congestion.   Respiratory: Negative for cough.   Cardiovascular: Negative for chest pain.  Gastrointestinal: Negative for abdominal pain.  Musculoskeletal: Positive for arthralgias, back pain and gait problem.  Skin: Negative for color change.  Neurological: Negative for weakness.  Hematological: Negative for adenopathy.  Psychiatric/Behavioral: Negative for agitation.    HISTORY: Past Medical, Surgical, Social, and Family History Reviewed & Updated per EMR.   Pertinent Historical Findings include:  Past Medical History:  Diagnosis Date  . CAD (coronary artery disease)    intolerance of statins  . COPD (chronic obstructive pulmonary disease) ([place])   . Current use of long term anticoagulation   . GERD (gastroesophageal reflux disease)   . Hyperlipidemia   . Hypertension   . Impaired glucose tolerance 02/14/2011  . LBP (low back pain)   . OA (osteoarthritis)   . 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    Past Surgical History:  Procedure Laterality Date  . 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 CARDIAC CATHETERIZATION  10/05/1998   Recommend CABG revascularizaition  . CARDIAC CATHETERIZATION  06/11/1999   Continue medical therapy  . CARDIAC CATHETERIZATION  01/08/2007   Patent grafts, recommend medical therapy  . CARDIOVASCULAR STRESS TEST  04/22/2010   Small lateral defect which is partially reversible, no ischemic EKG changes were noted.  Marland Kitchen CAROTID DOPPLER  04/22/2010   Bilateral ICAs-no evidence of diametere reduction, significant tortuosity, or any other vascular abnormality.  . CORONARY ARTERY BYPASS GRAFT  10/09/1998   LIMA to LAD, SVG to diag, SVG sequential to 2 marginals, SVG sequential to PDA & PLA. CE#25 porcine AVR.  Marland Kitchen TRANSTHORACIC ECHOCARDIOGRAM  05/14/2012   EF 50-55%, bioprosthesis of the aortic valve with well preserved LV function, moderate concentric [place]    Allergies  Allergen Reactions  . Atorvastatin   . Colesevelam   . Simvastatin   . Sulfa Antibiotics     Family History  Problem Relation Age of Onset  . Coronary artery disease Other   . Hypertension Other      Social History   Socioeconomic History  . Marital status: Widowed    Spouse name: Not on file  . Number of children: Not on file  . Years of education: Not on file  . Highest education level: Not on file  Occupational History  . Not on file  Social Needs  . Financial resource strain: Not hard at all  . Food insecurity:    Worry: Never true    Inability: Never true  . Transportation needs:    Medical: No    Non-medical: No  Tobacco Use  . Smoking status: Former Smoker    Types: Pipe    Last attempt to quit: 05/16/1998    Years since quitting: 20.1  . Smokeless tobacco: Former Network engineer and Sexual Activity  . Alcohol use: No  . Drug use: No  . Sexual activity: Never  Lifestyle  . Physical activity:    Days per week: 0 days    Minutes per session: 0 min  . Stress: Not at all  Relationships  . Social connections:    Talks on phone: More than three times a week    Gets together: More than three times a week    Attends religious service: More than 4 times per year    Active  member of club or organization: Yes    Attends meetings of clubs or organizations: More than 4 times per year    Relationship status: Widowed  . Intimate partner violence:    Fear of current or ex partner: Not on file    Emotionally abused: Not on file    Physically abused: Not on file    Forced sexual activity: Not on file  Other Topics Concern  . Not on file  Social History Narrative  . Not on file     PHYSICAL EXAM:  VS: BP (!) 150/70 (BP Location: Left Arm, Patient Position: Sitting, Cuff Size: Large)   Pulse 72   Temp 98 F (36.7 C) (Oral)   Ht 5\' 10"  (1.778 m)   Wt 211 lb 4 oz (95.8 kg)   SpO2 98%   BMI 30.31 kg/m  Physical Exam Gen: NAD, alert, cooperative with exam, well-appearing ENT: normal lips, normal nasal mucosa,  Eye: normal EOM, normal conjunctiva and lids CV:  no edema, +2 pedal pulses   Resp: no accessory muscle use, non-labored,  Skin: no rashes, no areas of induration  Neuro: normal tone, normal sensation to touch Psych:  normal insight, alert and oriented MSK:  Back/left hip: Some tenderness to palpation in the left lower paraspinal muscles. No significant tenderness palpation of the midline spine. Normal internal and external rotation of the hips. Normal strength resistance with hip flexion, knee flexion extension, plantarflexion and dorsiflexion. Negative straight leg raise bilaterally.   Neurovascular intact     ASSESSMENT & PLAN:   LOW BACK PAIN Likely has component of spasm with his most recent exacerbation.  No sciatic type symptoms. -Prednisone. -Counseled on home exercise therapy and supportive care. -If no improvement consider trigger point injections or home health physical therapy.

## 2018-06-29 NOTE — Assessment & Plan Note (Signed)
Likely has component of spasm with his most recent exacerbation.  No sciatic type symptoms. -Prednisone. -Counseled on home exercise therapy and supportive care. -If no improvement consider trigger point injections or home health physical therapy.

## 2018-09-14 ENCOUNTER — Emergency Department (HOSPITAL_COMMUNITY)
Admission: EM | Admit: 2018-09-14 | Discharge: 2018-09-15 | Disposition: A | Discharge status: Home / Self Care | Payer: Medicare Other | Attending: Emergency Medicine | Admitting: Emergency Medicine

## 2018-09-14 ENCOUNTER — Emergency Department (HOSPITAL_COMMUNITY): Payer: Medicare Other

## 2018-09-14 ENCOUNTER — Other Ambulatory Visit: Payer: Self-pay

## 2018-09-14 ENCOUNTER — Encounter (HOSPITAL_COMMUNITY): Payer: Self-pay | Admitting: Emergency Medicine

## 2018-09-14 ENCOUNTER — Telehealth: Payer: Self-pay | Admitting: Internal Medicine

## 2018-09-14 DIAGNOSIS — Z79899 Other long term (current) drug therapy: Secondary | ICD-10-CM | POA: Insufficient documentation

## 2018-09-14 DIAGNOSIS — M546 Pain in thoracic spine: Secondary | ICD-10-CM | POA: Diagnosis not present

## 2018-09-14 DIAGNOSIS — Z7901 Long term (current) use of anticoagulants: Secondary | ICD-10-CM | POA: Insufficient documentation

## 2018-09-14 DIAGNOSIS — R202 Paresthesia of skin: Secondary | ICD-10-CM | POA: Insufficient documentation

## 2018-09-14 DIAGNOSIS — J449 Chronic obstructive pulmonary disease, unspecified: Secondary | ICD-10-CM | POA: Diagnosis not present

## 2018-09-14 DIAGNOSIS — M2578 Osteophyte, vertebrae: Secondary | ICD-10-CM | POA: Diagnosis not present

## 2018-09-14 DIAGNOSIS — R079 Chest pain, unspecified: Secondary | ICD-10-CM | POA: Diagnosis not present

## 2018-09-14 DIAGNOSIS — I10 Essential (primary) hypertension: Secondary | ICD-10-CM | POA: Insufficient documentation

## 2018-09-14 DIAGNOSIS — R0602 Shortness of breath: Secondary | ICD-10-CM | POA: Insufficient documentation

## 2018-09-14 DIAGNOSIS — R0789 Other chest pain: Secondary | ICD-10-CM | POA: Diagnosis not present

## 2018-09-14 DIAGNOSIS — I252 Old myocardial infarction: Secondary | ICD-10-CM | POA: Insufficient documentation

## 2018-09-14 DIAGNOSIS — J984 Other disorders of lung: Secondary | ICD-10-CM | POA: Diagnosis not present

## 2018-09-14 DIAGNOSIS — I2510 Atherosclerotic heart disease of native coronary artery without angina pectoris: Secondary | ICD-10-CM | POA: Insufficient documentation

## 2018-09-14 DIAGNOSIS — Z7982 Long term (current) use of aspirin: Secondary | ICD-10-CM | POA: Diagnosis not present

## 2018-09-14 DIAGNOSIS — Z789 Other specified health status: Secondary | ICD-10-CM

## 2018-09-14 DIAGNOSIS — Z87891 Personal history of nicotine dependence: Secondary | ICD-10-CM | POA: Diagnosis not present

## 2018-09-14 LAB — CBC
HCT: 50.8 % (ref 39.0–52.0)
Hemoglobin: 16.5 g/dL (ref 13.0–17.0)
MCH: 30.1 pg (ref 26.0–34.0)
MCHC: 32.5 g/dL (ref 30.0–36.0)
MCV: 92.7 fL (ref 80.0–100.0)
Platelets: 107 10*3/uL — ABNORMAL LOW (ref 150–400)
RBC: 5.48 MIL/uL (ref 4.22–5.81)
RDW: 13.2 % (ref 11.5–15.5)
WBC: 7 10*3/uL (ref 4.0–10.5)
nRBC: 0 % (ref 0.0–0.2)

## 2018-09-14 LAB — BASIC METABOLIC PANEL
Anion gap: 12 (ref 5–15)
BUN: 19 mg/dL (ref 8–23)
CO2: 25 mmol/L (ref 22–32)
Calcium: 9.5 mg/dL (ref 8.9–10.3)
Chloride: 105 mmol/L (ref 98–111)
Creatinine, Ser: 1.46 mg/dL — ABNORMAL HIGH (ref 0.61–1.24)
GFR calc Af Amer: 47 mL/min — ABNORMAL LOW (ref >60)
GFR calc non Af Amer: 41 mL/min — ABNORMAL LOW (ref >60)
Glucose, Bld: 129 mg/dL — ABNORMAL HIGH (ref 70–99)
Potassium: 4.1 mmol/L (ref 3.5–5.1)
Sodium: 142 mmol/L (ref 135–145)

## 2018-09-14 LAB — I-STAT CREATININE, ED: Creatinine, Ser: 1.4 mg/dL — ABNORMAL HIGH (ref 0.61–1.24)

## 2018-09-14 LAB — TROPONIN I: Troponin I: <0.03 ng/mL (ref <0.03)

## 2018-09-14 MED ORDER — IOHEXOL 350 MG/ML SOLN
100.0000 mL | Freq: Once | INTRAVENOUS | Status: AC | PRN
  Administered 2018-09-14: 23:00:00 100 mL via INTRAVENOUS

## 2018-09-14 MED ORDER — DICLOFENAC SODIUM 1 % TD GEL: 4.0000 g | Freq: Four times a day (QID) | TRANSDERMAL | 0 refills | Status: DC

## 2018-09-14 MED ORDER — SODIUM CHLORIDE 0.9% FLUSH
3.0000 mL | Freq: Once | INTRAVENOUS | Status: AC
  Administered 2018-09-14: 3 mL via INTRAVENOUS

## 2018-09-14 MED ORDER — ACETAMINOPHEN 500 MG PO TABS
1000.0000 mg | ORAL_TABLET | Freq: Once | ORAL | Status: DC
  Filled 2018-09-14: qty 2

## 2018-09-14 MED ORDER — DICLOFENAC SODIUM 1 % TD GEL
4.0000 g | Freq: Once | TRANSDERMAL | Status: AC
  Administered 2018-09-15: 4 g via TOPICAL
  Filled 2018-09-14: qty 100

## 2018-09-14 MED ORDER — OXYCODONE HCL 5 MG PO TABS
2.5000 mg | ORAL_TABLET | Freq: Once | ORAL | Status: DC
  Filled 2018-09-14: qty 1

## 2018-09-14 NOTE — Discharge Instructions (Signed)
Take tylenol 1000mg(2 extra strength) four times a day.  ° °

## 2018-09-14 NOTE — Telephone Encounter (Signed)
Patient's daughter, Tommie Raymond, called due to concerns regarding her father's back pain. She states that he has had pain in the top of his neck for several days that has progressively gotten worse and is now radiating down his spine. She is concerned as she feels he is in obvious pain and now somewhat short of breath due to the pain. Per the chart, he has had ongoing issues with back pain but this has historically been lower back pain. We discussed that back pain would be an atypical presentation for cardiac disease but there was no way over the phone to further evaluate his pain. I recommended that he come to the ER for evaluation. His daughter is agreeable to this and will bring him this evening for evaluation of his pain.

## 2018-09-14 NOTE — ED Provider Notes (Signed)
[facility] [facility] EMERGENCY DEPARTMENT Provider Note   CSN: [id number] Arrival date & time: 09/14/18  2050    History   Chief Complaint Chief Complaint  Patient presents with  . Chest Pain  . Shortness of Breath    HPI David [name] is a 85 y.o. male.     85 yo M with a chief complaints of mid thoracic back pain.  This been going on for about 3 days and now radiates around to his left side.  Sometimes feels like tingling goes down into his left hand.  He denies trauma.  Is worse with twisting and movement and reaching with the left arm.  He denies cough congestion fevers or chills.  Denies abdominal pain vomiting or diarrhea.  He called his family doctor today and they were concerned about the pain going into his chest and referred him to the emergency department to be evaluated for possible atypical heart attack.  He has had a heart attack in the past and has had bypass surgery.  He thinks that this feels different.  He denies a rash.  Denies lower extremity edema denies hemoptysis.  The history is provided by the patient.  Chest Pain  Associated symptoms: back pain and shortness of breath   Associated symptoms: no abdominal pain, no fever, no headache, no palpitations and no vomiting   Shortness of Breath  Associated symptoms: chest pain   Associated symptoms: no abdominal pain, no fever, no headaches, no rash and no vomiting   Back Pain  Location:  Thoracic spine Quality:  Aching, shooting and stabbing Radiates to:  L shoulder Pain severity:  Moderate Onset quality:  Gradual Duration:  3 days Timing:  Constant Progression:  Worsening Chronicity:  New Context: twisting   Context: not falling, not jumping from heights, not lifting heavy objects and not MVA   Relieved by:  Nothing Worsened by:  Bending, movement and twisting Ineffective treatments:  None tried Associated symptoms: chest pain   Associated symptoms: no abdominal pain, no fever and no headaches      Past Medical History:  Diagnosis Date  . CAD (coronary artery disease)    intolerance of statins  . COPD (chronic obstructive pulmonary disease) ([place])   . Current use of long term anticoagulation   . GERD (gastroesophageal reflux disease)   . Hyperlipidemia   . Hypertension   . Impaired glucose tolerance 02/14/2011  . LBP (low back pain)   . OA (osteoarthritis)   . 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    Patient Active Problem List   Diagnosis Date Noted  . Onychomycosis 06/06/2018  . Acute pain of left knee 08/18/2017  . Wart 06/05/2017  . Acute upper respiratory infection 05/12/2016  . Skin irritation from shaving 12/02/2015  . Benign paroxysmal positional vertigo 03/09/2015  . Plantar fasciitis, bilateral 12/02/2014  . Constipation 07/17/2014  . Cough 04/09/2014  . S/P AVR (aortic valve replacement) 11/04/2013  . Elevated hemoglobin ([place]) 12/14/2012  . Well adult exam 11/12/2012  . 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 Impaired glucose tolerance 02/14/2011  . Edema 10/22/2010  . Actinic keratoses 10/22/2010  . THROMBOCYTOPENIA 01/22/2010  . MELANOMA 03/05/2009  . TOBACCO USE, QUIT 02/27/2009  . Neoplasm of uncertain behavior of skin 05/08/2007  . Dyslipidemia 02/08/2007  . Essential hypertension 02/08/2007  . Coronary atherosclerosis 02/08/2007  . COPD 02/08/2007  . GERD 02/08/2007  . OSTEOARTHRITIS 02/08/2007  . LOW BACK PAIN 02/08/2007  Past Surgical History:  Procedure Laterality Date  . 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 CARDIAC CATHETERIZATION  10/05/1998   Recommend CABG revascularizaition  . CARDIAC CATHETERIZATION  06/11/1999   Continue medical therapy  . CARDIAC CATHETERIZATION  01/08/2007   Patent grafts, recommend medical therapy  . CARDIOVASCULAR STRESS TEST  04/22/2010   Small lateral defect which is partially reversible, no ischemic EKG changes were noted.  Marland Kitchen CAROTID DOPPLER  04/22/2010   Bilateral ICAs-no  evidence of diametere reduction, significant tortuosity, or any other vascular abnormality.  . CORONARY ARTERY BYPASS GRAFT  10/09/1998   LIMA to LAD, SVG to diag, SVG sequential to 2 marginals, SVG sequential to PDA & PLA. CE#25 porcine AVR.  Marland Kitchen TRANSTHORACIC ECHOCARDIOGRAM  05/14/2012   EF 50-55%, bioprosthesis of the aortic valve with well preserved LV function, moderate concentric [facility] Medications    Prior to Admission medications   Medication Sig Start Date End Date Taking? Authorizing Provider  amoxicillin (AMOXIL) 500 MG capsule Take 2,000 mg by mouth See admin instructions. Take 4 capsules (2000 mg) by mouth one hour prior to dental appointments 06/27/18  Yes [provider]  aspirin EC 81 MG tablet Take 81 mg by mouth daily.   Yes [provider]  atenolol (TENORMIN) 25 MG tablet TAKE ONE TABLET BY MOUTH ONE TIME DAILY Patient taking differently: Take 25 mg by mouth daily.  11/26/17  Yes Plotnikov, [name], MD  Cholecalciferol (VITAMIN D3) 75 MCG (3000 UT) TABS Take 3,000 Units by mouth daily.   Yes [provider]  losartan (COZAAR) 25 MG tablet Take 1 tablet (25 mg total) by mouth daily. 02/23/18 11/23/18 Yes Troy Sine, MD  meclizine (ANTIVERT) 12.5 MG tablet TAKE 1 TABLET BY MOUTH 3 TIMES A DAY AS NEEDED FOR DIZZINESS Patient taking differently: Take 12.5 mg by mouth 3 (three) times daily as needed for dizziness.  05/30/18  Yes Plotnikov, [name], MD  ciclopirox (PENLAC) 8 % solution Apply topically at bedtime. Apply over nail and surrounding skin. Apply daily over previous coat. After seven (7) days, may remove with alcohol and continue cycle. Patient not taking: Reported on 09/14/2018 06/06/18   Plotnikov, [name], MD  diclofenac sodium (VOLTAREN) 1 % GEL Apply 4 g topically 4 (four) times daily. 09/14/18   Deno Etienne, DO  ezetimibe (ZETIA) 10 MG tablet Take 1 tablet (10 mg total) by mouth daily. Patient not taking: Reported on 09/14/2018  06/06/16   Plotnikov, [name], MD  furosemide (LASIX) 20 MG tablet Take 1 tablet (20 mg total) by mouth daily. Patient not taking: Reported on 09/14/2018 11/27/15 06/16/17  Troy Sine, MD    Family History Family History  Problem Relation Age of Onset  . Coronary artery disease Other   . Hypertension Other     Social History Social History   Tobacco Use  . Smoking status: Former Smoker    Types: Pipe    Last attempt to quit: 05/16/1998    Years since quitting: 20.3  . Smokeless tobacco: Former Network engineer Use Topics  . Alcohol use: No  . Drug use: No     Allergies   Atorvastatin; Colesevelam; Simvastatin; and Sulfa antibiotics   Review of Systems Review of Systems  Constitutional: Negative for chills and fever.  HENT: Negative for congestion and facial swelling.   Eyes: Negative for discharge and visual disturbance.  Respiratory: Positive for shortness of  breath.   Cardiovascular: Positive for chest pain. Negative for palpitations.  Gastrointestinal: Negative for abdominal pain, diarrhea and vomiting.  Musculoskeletal: Positive for back pain. Negative for arthralgias and myalgias.  Skin: Negative for color change and rash.  Neurological: Negative for tremors, syncope and headaches.  Psychiatric/Behavioral: Negative for confusion and dysphoric mood.     Physical Exam Updated Vital Signs BP (!) 153/74   Pulse 79   Temp 98.6 F (37 C) (Oral)   Resp (!) 25   SpO2 96%   Physical Exam Vitals signs and nursing note reviewed.  Constitutional:      Appearance: He is well-developed.  HENT:     Head: Normocephalic and atraumatic.  Eyes:     Pupils: Pupils are equal, round, and reactive to light.  Neck:     Musculoskeletal: Normal range of motion and neck supple.     Vascular: No JVD.  Cardiovascular:     Rate and Rhythm: Normal rate and regular rhythm.     Heart sounds: No murmur. No friction rub. No gallop.   Pulmonary:     Effort: No respiratory  distress.     Breath sounds: No wheezing.  Abdominal:     General: There is no distension.     Tenderness: There is no guarding or rebound.  Musculoskeletal: Normal range of motion.     Comments: No appreciable back tenderness on my exam.  Pulse motor and sensation are intact to bilateral upper extremities.  No reproducible chest tenderness.  Skin:    Coloration: Skin is not pale.     Findings: No rash.  Neurological:     Mental Status: He is alert and oriented to person, place, and time.  Psychiatric:        Behavior: Behavior normal.      ED Treatments / Results  Labs (all labs ordered are listed, but only abnormal results are displayed) Labs Reviewed  BASIC METABOLIC PANEL - Abnormal; Notable for the following components:      Result Value   Glucose, Bld 129 (*)    Creatinine, Ser 1.46 (*)    GFR calc non Af Amer 41 (*)    GFR calc Af Amer 47 (*)    All other components within normal limits  CBC - Abnormal; Notable for the following components:   Platelets 107 (*)    All other components within normal limits  I-STAT CREATININE, ED - Abnormal; Notable for the following components:   Creatinine, Ser 1.40 (*)    All other components within normal limits  TROPONIN I  TROPONIN I    EKG EKG Interpretation  Date/Time:  Friday Sep 14 2018 21:05:23 EDT Ventricular Rate:  72 PR Interval:  144 QRS Duration: 140 QT Interval:  416 QTC Calculation: 455 R Axis:   55 Text Interpretation:  Normal sinus rhythm Left bundle branch block Abnormal ECG with QRS widening Otherwise no significant change Confirmed by Deno Etienne [id number]) on 09/14/2018 9:25:55 PM   Radiology Dg Chest 2 View  Result Date: 09/14/2018 CLINICAL DATA:  Pain EXAM: CHEST - 2 VIEW COMPARISON:  01/04/2007 FINDINGS: Prior CABG. Scarring in the lingula. No acute confluent airspace opacities or effusions. Heart is borderline in size. No acute bony abnormality. IMPRESSION: No active cardiopulmonary disease.  Electronically Signed   By: Rolm Baptise M.D.   On: 09/14/2018 22:24    Procedures Procedures (including critical care time)  Medications Ordered in ED Medications  diclofenac sodium (VOLTAREN) 1 % transdermal gel 4 g (  has no administration in time range)  acetaminophen (TYLENOL) tablet 1,000 mg (1,000 mg Oral Not Given 09/14/18 2207)  oxyCODONE (Oxy IR/ROXICODONE) immediate release tablet 2.5 mg (2.5 mg Oral Not Given 09/14/18 2207)  sodium chloride flush (NS) 0.9 % injection 3 mL (3 mLs Intravenous Given 09/14/18 2207)  iohexol (OMNIPAQUE) 350 MG/ML injection 100 mL (100 mLs Intravenous Contrast Given 09/14/18 2312)     Initial Impression / Assessment and Plan / ED Course  I have reviewed the triage vital signs and the nursing notes.  Pertinent labs & imaging results that were available during my care of the patient were reviewed by me and considered in my medical decision making (see chart for details).        85 yo M with a chief complaints of thoracic back pain that radiates around to his left chest wall.  Is been going on for 3 days worse with movement twisting.  Most likely this is musculoskeletal based on history.  I am unable to reproduce on exam.  He is sent here by his doctor to evaluate for atypical presentation of a heart attack.  With the patient's advanced age I will obtain a CT scan of the back.  Will obtain a CT of the chest.  Chest x-ray lab work Voltaren gel Tylenol oxycodone tablet reassess.  Lab work is largely unremarkable.  Will obtain a delta troponin.  Case was discussed with Dr. Nicholes Stairs please see her note for further details of care in the ED.  Chest x-ray viewed by me without focal infiltrate or pneumothorax.  The patients results and plan were reviewed and discussed.   Any x-rays performed were independently reviewed by myself.   Differential diagnosis were considered with the presenting HPI.  Medications  diclofenac sodium (VOLTAREN) 1 % transdermal gel 4 g  (has no administration in time range)  acetaminophen (TYLENOL) tablet 1,000 mg (1,000 mg Oral Not Given 09/14/18 2207)  oxyCODONE (Oxy IR/ROXICODONE) immediate release tablet 2.5 mg (2.5 mg Oral Not Given 09/14/18 2207)  sodium chloride flush (NS) 0.9 % injection 3 mL (3 mLs Intravenous Given 09/14/18 2207)  iohexol (OMNIPAQUE) 350 MG/ML injection 100 mL (100 mLs Intravenous Contrast Given 09/14/18 2312)    Vitals:   09/14/18 2109 09/14/18 2140 09/14/18 2200 09/14/18 2300  BP: (!) 163/75 (!) 153/74    Pulse: 72 78 75 79  Resp: 18  (!) 22 (!) 25  Temp: 98.6 F (37 C)     TempSrc: Oral     SpO2: 98% 98% 96% 96%    Final diagnoses:  Acute left-sided thoracic back pain      Final Clinical Impressions(s) / ED Diagnoses   Final diagnoses:  Acute left-sided thoracic back pain    ED Discharge Orders         Ordered    diclofenac sodium (VOLTAREN) 1 % GEL  4 times daily     09/14/18 Sprague, [place], DO 09/14/18 2323

## 2018-09-14 NOTE — ED Notes (Signed)
Pt transported to CT ?

## 2018-09-14 NOTE — ED Triage Notes (Signed)
Patient states he started having pain in his spine yesterday, which has progressively gotten worse and wrapped around to his chest.  His pain is sharp in nature and moved to dullness with tingling in his left arm.  Patient denies any nausea or vomiting at this time.

## 2018-09-15 DIAGNOSIS — M546 Pain in thoracic spine: Secondary | ICD-10-CM | POA: Diagnosis not present

## 2018-09-15 LAB — TROPONIN I: Troponin I: <0.03 ng/mL (ref <0.03)

## 2018-09-15 NOTE — ED Notes (Signed)
Discharge instructions reviewed with pt's daughter.

## 2018-09-15 NOTE — ED Notes (Signed)
Patient verbalizes understanding of discharge instructions. Opportunity for questioning and answers were provided. Armband removed by staff, pt discharged from ED ambulatory.   

## 2018-09-20 ENCOUNTER — Encounter: Payer: Self-pay | Admitting: Internal Medicine

## 2018-09-20 ENCOUNTER — Ambulatory Visit (INDEPENDENT_AMBULATORY_CARE_PROVIDER_SITE_OTHER): Payer: Medicare Other | Admitting: Internal Medicine

## 2018-09-20 DIAGNOSIS — E785 Hyperlipidemia, unspecified: Secondary | ICD-10-CM

## 2018-09-20 DIAGNOSIS — R0789 Other chest pain: Secondary | ICD-10-CM | POA: Diagnosis not present

## 2018-09-20 DIAGNOSIS — I330 Acute and subacute infective endocarditis: Secondary | ICD-10-CM | POA: Diagnosis not present

## 2018-09-20 DIAGNOSIS — I10 Essential (primary) hypertension: Secondary | ICD-10-CM | POA: Diagnosis not present

## 2018-09-20 HISTORY — DX: Other chest pain: R07.89

## 2018-09-20 NOTE — Progress Notes (Signed)
Virtual Visit via Video Note  I connected with David [name] on 09/20/18 at  2:40 PM EDT by a video enabled telemedicine application and verified that I am speaking with the correct person using two identifiers.   I discussed the limitations of evaluation and management by telemedicine and the availability of in person appointments. The patient expressed understanding and agreed to proceed.  History of Present Illness: We need to follow-up on hypertension, coronary artery disease, dyslipidemia  There was a recent episode of atypical chest pain requiring a trip to the emergency room on 9 May 1.  CT angiogram did not reveal any PE.  MI was ruled out  There has been no runny nose, cough, chest pain -improving, shortness of breath, abdominal pain, diarrhea, constipation, arthralgias, skin rashes.   Observations/Objective: The patient appears to be in no acute distress, looks well.  Assessment and Plan: COVID-19 prophylaxis discussed See my Assessment and Plan. Follow Up Instructions:    I discussed the assessment and treatment plan with the patient. The patient was provided an opportunity to ask questions and all were answered. The patient agreed with the plan and demonstrated an understanding of the instructions.   The patient was advised to call back or seek an in-person evaluation if the symptoms worsen or if the condition fails to improve as anticipated.  I provided face-to-face time during this encounter. We were at different locations.   Walker Kehr, MD

## 2018-09-20 NOTE — Assessment & Plan Note (Signed)
IMPRESSION: CT CHEST IMPRESSION  1. No CT evidence for acute pulmonary embolism. 2. 8 mm focal filling defect adjacent to the pulmonic valve, likely a small amount of thrombus adherent to the valve itself. Finding places this patient at risk for pulmonary embolism. Correlation with dedicated echocardiogram suggested. 3. Underlying COPD with bibasilar atelectasis. No other active cardiopulmonary disease. 4. Moderate atherosclerosis with diffuse 3 vessel coronary artery calcifications. Sequelae of prior AVR and CABG.  CT THORACIC SPINE IMPRESSION  No acute traumatic injury or other abnormality within the thoracic spine.   Electronically Signed   By: Jeannine Boga M.D.   On: 09/15/2018 00:32

## 2018-09-20 NOTE — Assessment & Plan Note (Signed)
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 Measures 9 x 8 mm Present in echo   from 2017. Mild PI.

## 2018-09-20 NOTE — Assessment & Plan Note (Signed)
Continue with lisinopril, atenolol, Lasix

## 2018-09-20 NOTE — Assessment & Plan Note (Signed)
Continue with Zetia

## 2018-11-22 ENCOUNTER — Other Ambulatory Visit: Payer: Self-pay | Admitting: Internal Medicine

## 2018-12-05 ENCOUNTER — Ambulatory Visit: Payer: Medicare Other | Admitting: Internal Medicine

## 2018-12-15 ENCOUNTER — Other Ambulatory Visit: Payer: Self-pay | Admitting: Cardiovascular Disease

## 2018-12-23 ENCOUNTER — Other Ambulatory Visit: Payer: Self-pay | Admitting: Internal Medicine

## 2018-12-25 ENCOUNTER — Other Ambulatory Visit: Payer: Self-pay

## 2018-12-25 ENCOUNTER — Ambulatory Visit (HOSPITAL_COMMUNITY): Payer: Medicare Other | Attending: Cardiology

## 2018-12-25 DIAGNOSIS — Z952 Presence of prosthetic heart valve: Secondary | ICD-10-CM

## 2018-12-25 DIAGNOSIS — D151 Benign neoplasm of heart: Secondary | ICD-10-CM | POA: Diagnosis not present

## 2018-12-25 MED ORDER — PERFLUTREN LIPID MICROSPHERE
1.0000 mL | INTRAVENOUS | Status: AC | PRN
  Administered 2018-12-25: 2 mL via INTRAVENOUS

## 2019-05-07 ENCOUNTER — Other Ambulatory Visit: Payer: Self-pay | Admitting: Internal Medicine

## 2019-05-14 ENCOUNTER — Telehealth: Payer: Self-pay

## 2019-05-14 ENCOUNTER — Other Ambulatory Visit: Payer: Self-pay | Admitting: Internal Medicine

## 2019-05-14 DIAGNOSIS — R609 Edema, unspecified: Secondary | ICD-10-CM

## 2019-05-14 DIAGNOSIS — I10 Essential (primary) hypertension: Secondary | ICD-10-CM

## 2019-05-14 DIAGNOSIS — E785 Hyperlipidemia, unspecified: Secondary | ICD-10-CM

## 2019-05-14 DIAGNOSIS — R972 Elevated prostate specific antigen [PSA]: Secondary | ICD-10-CM

## 2019-05-14 MED ORDER — MECLIZINE HCL 12.5 MG PO TABS: ORAL_TABLET | ORAL | 0 refills | Status: DC

## 2019-05-14 NOTE — Telephone Encounter (Signed)
Ok Thx 

## 2019-05-14 NOTE — Telephone Encounter (Signed)
Copied from [place] [id number]. Topic: Quick Communication - Rx Refill/Question >> May 14, 2019 12:13 PM Rainey Pines A wrote: Medication:meclizine (ANTIVERT) 12.5 MG tablet  Has the patient contacted their pharmacy Yes (Agent: If no, request that the patient contact the pharmacy for the refill.) (Agent: If yes, when and what did the pharmacy advise?)Contact PCP  Preferred Pharmacy (with phone number or street name): [facility], [place]  Phone:  [phone] Fax:  [phone]     Agent: Please be advised that RX refills may take up to 3 business days. We ask that you follow-up with your pharmacy.

## 2019-05-14 NOTE — Telephone Encounter (Signed)
Copied from [place] [id number]. Topic: General - Other >> May 14, 2019 12:17 PM Rainey Pines A wrote: Patients daughter would like to make sure that patient will have labs done for appointment including a panel that check patients thyroid. Please advise

## 2019-05-14 NOTE — Telephone Encounter (Signed)
Patient is scheduled with you 05/20/19. Please advise.

## 2019-05-14 NOTE — Telephone Encounter (Signed)
Requested medication (s) are due for refill today: yes  Requested medication (s) are on the active medication list: yes  Last refill:  12/24/2018  Future visit scheduled: yes  Notes to clinic: This refill cannot be delegated  Patient has upcoming appointment    Requested Prescriptions  Pending Prescriptions Disp Refills   meclizine (ANTIVERT) 12.5 MG tablet 60 tablet 1    Sig: TAKE 1 TABLET BY MOUTH THREE TIMES A DAY AS NEEDED FOR DIZZINESS      Not Delegated - Gastroenterology: Antiemetics Failed - 05/14/2019 12:24 PM      Failed - This refill cannot be delegated      Failed - Valid encounter within last 6 months    Recent Outpatient Visits           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

## 2019-05-20 ENCOUNTER — Ambulatory Visit (INDEPENDENT_AMBULATORY_CARE_PROVIDER_SITE_OTHER): Payer: Medicare Other | Admitting: Internal Medicine

## 2019-05-20 ENCOUNTER — Encounter: Payer: Self-pay | Admitting: Internal Medicine

## 2019-05-20 ENCOUNTER — Other Ambulatory Visit: Payer: Self-pay

## 2019-05-20 DIAGNOSIS — R251 Tremor, unspecified: Secondary | ICD-10-CM | POA: Diagnosis not present

## 2019-05-20 DIAGNOSIS — I10 Essential (primary) hypertension: Secondary | ICD-10-CM | POA: Diagnosis not present

## 2019-05-20 DIAGNOSIS — E785 Hyperlipidemia, unspecified: Secondary | ICD-10-CM | POA: Diagnosis not present

## 2019-05-20 DIAGNOSIS — R972 Elevated prostate specific antigen [PSA]: Secondary | ICD-10-CM

## 2019-05-20 DIAGNOSIS — R609 Edema, unspecified: Secondary | ICD-10-CM

## 2019-05-20 DIAGNOSIS — H8111 Benign paroxysmal vertigo, right ear: Secondary | ICD-10-CM

## 2019-05-20 DIAGNOSIS — R7302 Impaired glucose tolerance (oral): Secondary | ICD-10-CM

## 2019-05-20 LAB — CBC WITH DIFFERENTIAL/PLATELET
Basophils Absolute: 0 10*3/uL (ref 0.0–0.1)
Basophils Relative: 0.4 % (ref 0.0–3.0)
Eosinophils Absolute: 0.2 10*3/uL (ref 0.0–0.7)
Eosinophils Relative: 2.5 % (ref 0.0–5.0)
HCT: 48.3 % (ref 39.0–52.0)
Hemoglobin: 16.3 g/dL (ref 13.0–17.0)
Lymphocytes Relative: 35.3 % (ref 12.0–46.0)
Lymphs Abs: 2.2 10*3/uL (ref 0.7–4.0)
MCHC: 33.7 g/dL (ref 30.0–36.0)
MCV: 91.8 fl (ref 78.0–100.0)
Monocytes Absolute: 0.6 10*3/uL (ref 0.1–1.0)
Monocytes Relative: 9 % (ref 3.0–12.0)
Neutro Abs: 3.3 10*3/uL (ref 1.4–7.7)
Neutrophils Relative %: 52.8 % (ref 43.0–77.0)
Platelets: 93 10*3/uL — ABNORMAL LOW (ref 150.0–400.0)
RBC: 5.26 Mil/uL (ref 4.22–5.81)
RDW: 14.1 % (ref 11.5–15.5)
WBC: 6.2 10*3/uL (ref 4.0–10.5)

## 2019-05-20 LAB — LIPID PANEL
Cholesterol: 183 mg/dL (ref 0–200)
HDL: 45.3 mg/dL (ref >39.00)
LDL Cholesterol: 122 mg/dL — ABNORMAL HIGH (ref 0–99)
NonHDL: 137.28
Total CHOL/HDL Ratio: 4
Triglycerides: 76 mg/dL (ref 0.0–149.0)
VLDL: 15.2 mg/dL (ref 0.0–40.0)

## 2019-05-20 LAB — TSH: TSH: 3.38 u[IU]/mL (ref 0.35–4.50)

## 2019-05-20 LAB — URINALYSIS
Bilirubin Urine: NEGATIVE
Hgb urine dipstick: NEGATIVE
Ketones, ur: NEGATIVE
Leukocytes,Ua: NEGATIVE
Nitrite: NEGATIVE
Specific Gravity, Urine: 1.025 (ref 1.000–1.030)
Total Protein, Urine: NEGATIVE
Urine Glucose: NEGATIVE
Urobilinogen, UA: 0.2 (ref 0.0–1.0)
pH: 5.5 (ref 5.0–8.0)

## 2019-05-20 LAB — HEPATIC FUNCTION PANEL
ALT: 11 U/L (ref 0–53)
AST: 16 U/L (ref 0–37)
Albumin: 4.2 g/dL (ref 3.5–5.2)
Alkaline Phosphatase: 52 U/L (ref 39–117)
Bilirubin, Direct: 0.1 mg/dL (ref 0.0–0.3)
Total Bilirubin: 0.9 mg/dL (ref 0.2–1.2)
Total Protein: 7.3 g/dL (ref 6.0–8.3)

## 2019-05-20 LAB — BASIC METABOLIC PANEL
BUN: 21 mg/dL (ref 6–23)
CO2: 29 mEq/L (ref 19–32)
Calcium: 9.4 mg/dL (ref 8.4–10.5)
Chloride: 104 mEq/L (ref 96–112)
Creatinine, Ser: 1.39 mg/dL (ref 0.40–1.50)
GFR: 47.61 mL/min — ABNORMAL LOW (ref >60.00)
Glucose, Bld: 108 mg/dL — ABNORMAL HIGH (ref 70–99)
Potassium: 4.3 mEq/L (ref 3.5–5.1)
Sodium: 141 mEq/L (ref 135–145)

## 2019-05-20 LAB — PSA: PSA: 4.88 ng/mL — ABNORMAL HIGH (ref 0.10–4.00)

## 2019-05-20 MED ORDER — PRIMIDONE 50 MG PO TABS: 50.0000 mg | ORAL_TABLET | Freq: Three times a day (TID) | ORAL | 5 refills | Status: DC

## 2019-05-20 NOTE — Assessment & Plan Note (Signed)
Recurrent sx's  B-D exercises, Meclizine

## 2019-05-20 NOTE — Assessment & Plan Note (Signed)
BMET 

## 2019-05-20 NOTE — Assessment & Plan Note (Signed)
Lisinopril, Atenolol and Lasix

## 2019-05-20 NOTE — Addendum Note (Signed)
Addended by: Raliegh Ip on: 05/20/2019 10:10 AM   Modules accepted: Orders

## 2019-05-20 NOTE — Assessment & Plan Note (Signed)
1/21 - worse Neurol ref offered  Thyroid tests

## 2019-05-20 NOTE — Assessment & Plan Note (Signed)
Zetia Labs

## 2019-05-20 NOTE — Assessment & Plan Note (Signed)
Mild. Doing well now.

## 2019-05-20 NOTE — Patient Instructions (Signed)
If you have medicare related insurance (such as traditional Medicare, [employer], [employer], or similar), Please make an appointment at the scheduling desk with a [employer], for your Wellness visit in this office, which is a benefit with your insurance.

## 2019-05-20 NOTE — Progress Notes (Signed)
Subjective:  Patient ID: David [name], male    DOB: 08-05-1923  Age: 86 y.o. MRN: [id number]  CC: No chief complaint on file.   HPI David [name] presents for a 6 mo f/u: HTN, CAD, dyslipidemia  C/o tremor - worse 6 month  Outpatient Medications Prior to Visit  Medication Sig Dispense Refill  . amoxicillin (AMOXIL) 500 MG capsule Take 2,000 mg by mouth See admin instructions. Take 4 capsules (2000 mg) by mouth one hour prior to dental appointments    . aspirin EC 81 MG tablet Take 81 mg by mouth daily.    Marland Kitchen atenolol (TENORMIN) 25 MG tablet TAKE ONE TABLET BY MOUTH ONE TIME DAILY 90 tablet 3  . Cholecalciferol (VITAMIN D3) 75 MCG (3000 UT) TABS Take 3,000 Units by mouth daily.    . ciclopirox (PENLAC) 8 % solution Apply topically at bedtime. Apply over nail and surrounding skin. Apply daily over previous coat. After seven (7) days, may remove with alcohol and continue cycle. 6.6 mL 0  . diclofenac sodium (VOLTAREN) 1 % GEL Apply 4 g topically 4 (four) times daily. 100 g 0  . ezetimibe (ZETIA) 10 MG tablet Take 1 tablet (10 mg total) by mouth daily. 90 tablet 3  . losartan (COZAAR) 25 MG tablet TAKE 1 TABLET BY MOUTH EVERY DAY 90 tablet 1  . meclizine (ANTIVERT) 12.5 MG tablet TAKE 1 TABLET BY MOUTH THREE TIMES A DAY AS NEEDED FOR DIZZINESS 60 tablet 0  . furosemide (LASIX) 20 MG tablet Take 1 tablet (20 mg total) by mouth daily. (Patient not taking: Reported on 09/14/2018) 30 tablet 11   No facility-administered medications prior to visit.    ROS: Review of Systems  Constitutional: Positive for unexpected weight change. Negative for appetite change and fatigue.  HENT: Negative for congestion, nosebleeds, sneezing, sore throat and trouble swallowing.   Eyes: Negative for itching and visual disturbance.  Respiratory: Negative for cough.   Cardiovascular: Negative for chest pain, palpitations and leg swelling.  Gastrointestinal: Negative for abdominal distention, blood in stool,  diarrhea and nausea.  Genitourinary: Negative for frequency and hematuria.  Musculoskeletal: Negative for back pain, gait problem, joint swelling and neck pain.  Skin: Negative for rash.  Neurological: Positive for tremors. Negative for dizziness, speech difficulty and weakness.  Psychiatric/Behavioral: Negative for agitation, dysphoric mood and sleep disturbance. The patient is not nervous/anxious.     Objective:  BP (!) 148/76 (BP Location: Left Arm, Patient Position: Sitting, Cuff Size: Normal)   Pulse 60   Temp 98.2 F (36.8 C) (Oral)   Ht 5\' 10"  (1.778 m)   Wt 205 lb (93 kg)   SpO2 95%   BMI 29.41 kg/m   BP Readings from Last 3 Encounters:  05/20/19 (!) 148/76  09/14/18 134/73  06/29/18 (!) 150/70    Wt Readings from Last 3 Encounters:  05/20/19 205 lb (93 kg)  06/29/18 211 lb 4 oz (95.8 kg)  06/06/18 218 lb (98.9 kg)    Physical Exam Constitutional:      General: He is not in acute distress.    Appearance: He is well-developed. He is obese.     Comments: NAD  Eyes:     Conjunctiva/sclera: Conjunctivae normal.     Pupils: Pupils are equal, round, and reactive to light.  Neck:     Thyroid: No thyromegaly.     Vascular: No JVD.  Cardiovascular:     Rate and Rhythm: Normal rate and regular rhythm.  Heart sounds: Normal heart sounds. No murmur. No friction rub. No gallop.   Pulmonary:     Effort: Pulmonary effort is normal. No respiratory distress.     Breath sounds: Normal breath sounds. No wheezing or rales.  Chest:     Chest wall: No tenderness.  Abdominal:     General: Bowel sounds are normal. There is no distension.     Palpations: Abdomen is soft. There is no mass.     Tenderness: There is no abdominal tenderness. There is no guarding or rebound.  Musculoskeletal:        General: No tenderness. Normal range of motion.     Cervical back: Normal range of motion.  Lymphadenopathy:     Cervical: No cervical adenopathy.  Skin:    General: Skin is warm  and dry.     Findings: No rash.  Neurological:     Mental Status: He is alert and oriented to person, place, and time.     Cranial Nerves: No cranial nerve deficit.     Motor: No abnormal muscle tone.     Coordination: Coordination abnormal.     Gait: Gait normal.     Deep Tendon Reflexes: Reflexes are normal and symmetric.  Psychiatric:        Behavior: Behavior normal.        Thought Content: Thought content normal.        Judgment: Judgment normal.   B hand tremor  Lab Results  Component Value Date   WBC 7.0 09/14/2018   HGB 16.5 09/14/2018   HCT 50.8 09/14/2018   PLT 107 (L) 09/14/2018   GLUCOSE 129 (H) 09/14/2018   CHOL 169 06/07/2018   TRIG 77.0 06/07/2018   HDL 45.40 06/07/2018   LDLDIRECT 136.2 08/01/2007   LDLCALC 109 (H) 06/07/2018   ALT 13 06/07/2018   AST 17 06/07/2018   NA 142 09/14/2018   K 4.1 09/14/2018   CL 105 09/14/2018   CREATININE 1.40 (H) 09/14/2018   BUN 19 09/14/2018   CO2 25 09/14/2018   TSH 2.65 06/07/2018   PSA 3.49 06/07/2018   INR 1.0 01/08/2007   HGBA1C 5.8 06/06/2016    DG Chest 2 View  Result Date: 09/14/2018 CLINICAL DATA:  Pain EXAM: CHEST - 2 VIEW COMPARISON:  01/04/2007 FINDINGS: Prior CABG. Scarring in the lingula. No acute confluent airspace opacities or effusions. Heart is borderline in size. No acute bony abnormality. IMPRESSION: No active cardiopulmonary disease. Electronically Signed   By: Rolm Baptise M.D.   On: 09/14/2018 22:24   CT Angio Chest PE W and/or Wo Contrast  Result Date: 09/15/2018 CLINICAL DATA:  Initial evaluation for progressive thoracic back pain extending into chest. EXAM: CT ANGIOGRAPHY CHEST WITH CONTRAST CT THORACIC SPINE WITHOUT CONTRAST TECHNIQUE: Multidetector CT imaging of the chest was performed using the standard protocol during bolus administration of intravenous contrast. Multiplanar CT image reconstructions and MIPs were obtained to evaluate the vascular anatomy. CONTRAST:  146mL OMNIPAQUE IOHEXOL  350 MG/ML SOLN COMPARISON:  Comparison made with prior radiograph from earlier the same day. FINDINGS: Cardiovascular: Normal in caliber without aneurysm or other acute finding. Moderate aortic atherosclerosis. Partially visualized great vessels mildly tortuous but within normal limits. 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 Mild cardiomegaly. Diffuse 3 vessel coronary artery calcifications. No pericardial effusion. Pulmonary arterial tree adequately opacified for evaluation. Main pulmonary artery within normal limits for caliber. 8 x 7 mm focal filling defect adjacent to the pulmonic valve could reflect  a small focus of thrombus (series 5, image 209). This appears adherent to the valve itself. No filling defect seen elsewhere along the pulmonary tree to suggest acute pulmonary embolism. Re-formatted imaging confirms these findings. Mediastinum/Nodes: Thyroid within normal limits. Mildly prominent 13 mm pretracheal lymph node noted. 11 mm noted at the 80 recess. No other pathologically enlarged mediastinal lymph nodes. 11 mm left infrahilar node noted. No right hilar adenopathy. No enlarged axillary lymph nodes. Esophagus within normal limits. Lungs/Pleura: Irregular soft tissue density along the right lateral wall of the subglottic trachea likely reflects a small amount of adherent secretions. Tracheobronchial tree otherwise patent. Scattered atelectatic changes seen dependently within the lung bases bilaterally. Probable underlying COPD. Trace bilateral pleural effusions. No pulmonary edema. No consolidative opacity. No pneumothorax. No worrisome pulmonary nodule or mass. Upper Abdomen: Visualized upper abdomen demonstrates no acute finding. Few scattered left renal cysts noted, largest of which measures 3.5 cm. Musculoskeletal: External soft tissues demonstrate no acute finding. No acute osseous finding. No discrete lytic or blastic osseous lesions. Review of the MIP images confirms the above  findings. CT THORACIC SPINE FINDINGS Vertebral bodies normally aligned with preservation of the normal thoracic kyphosis. No listhesis or malalignment. Vertebral body heights maintained without evidence for acute or chronic fracture. No discrete or worrisome osseous lesions. Paraspinous soft tissues demonstrate no acute finding. No appreciable disc protrusion within the thoracic spine. Spinal canal widely patent. Mild facet hypertrophy within the lower thoracic spine. No significant foraminal stenosis. Diffusely flowing endplate osteophytic spurring seen along the right anterolateral aspect of the mid and lower thoracic spine. IMPRESSION: CT CHEST IMPRESSION 1. No CT evidence for acute pulmonary embolism. 2. 8 mm focal filling defect adjacent to the pulmonic valve, likely a small amount of thrombus adherent to the valve itself. Finding places this patient at risk for pulmonary embolism. Correlation with dedicated echocardiogram suggested. 3. Underlying COPD with bibasilar atelectasis. No other active cardiopulmonary disease. 4. Moderate atherosclerosis with diffuse 3 vessel coronary artery calcifications. Sequelae of prior AVR and CABG. CT THORACIC SPINE IMPRESSION No acute traumatic injury or other abnormality within the thoracic spine. Electronically Signed   By: Jeannine Boga M.D.   On: 09/15/2018 00:32   CT T-SPINE NO CHARGE  Result Date: 09/15/2018 CLINICAL DATA:  Initial evaluation for progressive thoracic back pain extending into chest. EXAM: CT ANGIOGRAPHY CHEST WITH CONTRAST CT THORACIC SPINE WITHOUT CONTRAST TECHNIQUE: Multidetector CT imaging of the chest was performed using the standard protocol during bolus administration of intravenous contrast. Multiplanar CT image reconstructions and MIPs were obtained to evaluate the vascular anatomy. CONTRAST:  164mL OMNIPAQUE IOHEXOL 350 MG/ML SOLN COMPARISON:  Comparison made with prior radiograph from earlier the same day. FINDINGS: Cardiovascular:  Normal in caliber without aneurysm or other acute finding. Moderate aortic atherosclerosis. Partially visualized great vessels mildly tortuous but within normal limits. 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 Mild cardiomegaly. Diffuse 3 vessel coronary artery calcifications. No pericardial effusion. Pulmonary arterial tree adequately opacified for evaluation. Main pulmonary artery within normal limits for caliber. 8 x 7 mm focal filling defect adjacent to the pulmonic valve could reflect a small focus of thrombus (series 5, image 209). This appears adherent to the valve itself. No filling defect seen elsewhere along the pulmonary tree to suggest acute pulmonary embolism. Re-formatted imaging confirms these findings. Mediastinum/Nodes: Thyroid within normal limits. Mildly prominent 13 mm pretracheal lymph node noted. 11 mm noted at the 80 recess. No other pathologically enlarged mediastinal lymph nodes. [place]  mm left infrahilar node noted. No right hilar adenopathy. No enlarged axillary lymph nodes. Esophagus within normal limits. Lungs/Pleura: Irregular soft tissue density along the right lateral wall of the subglottic trachea likely reflects a small amount of adherent secretions. Tracheobronchial tree otherwise patent. Scattered atelectatic changes seen dependently within the lung bases bilaterally. Probable underlying COPD. Trace bilateral pleural effusions. No pulmonary edema. No consolidative opacity. No pneumothorax. No worrisome pulmonary nodule or mass. Upper Abdomen: Visualized upper abdomen demonstrates no acute finding. Few scattered left renal cysts noted, largest of which measures 3.5 cm. Musculoskeletal: External soft tissues demonstrate no acute finding. No acute osseous finding. No discrete lytic or blastic osseous lesions. Review of the MIP images confirms the above findings. CT THORACIC SPINE FINDINGS Vertebral bodies normally aligned with preservation of the normal thoracic  kyphosis. No listhesis or malalignment. Vertebral body heights maintained without evidence for acute or chronic fracture. No discrete or worrisome osseous lesions. Paraspinous soft tissues demonstrate no acute finding. No appreciable disc protrusion within the thoracic spine. Spinal canal widely patent. Mild facet hypertrophy within the lower thoracic spine. No significant foraminal stenosis. Diffusely flowing endplate osteophytic spurring seen along the right anterolateral aspect of the mid and lower thoracic spine. IMPRESSION: CT CHEST IMPRESSION 1. No CT evidence for acute pulmonary embolism. 2. 8 mm focal filling defect adjacent to the pulmonic valve, likely a small amount of thrombus adherent to the valve itself. Finding places this patient at risk for pulmonary embolism. Correlation with dedicated echocardiogram suggested. 3. Underlying COPD with bibasilar atelectasis. No other active cardiopulmonary disease. 4. Moderate atherosclerosis with diffuse 3 vessel coronary artery calcifications. Sequelae of prior AVR and CABG. CT THORACIC SPINE IMPRESSION No acute traumatic injury or other abnormality within the thoracic spine. Electronically Signed   By: Jeannine Boga M.D.   On: 09/15/2018 00:32    Assessment & Plan:   There are no diagnoses linked to this encounter.   No orders of the defined types were placed in this encounter.    Follow-up: No follow-ups on file.  Walker Kehr, MD

## 2019-06-12 ENCOUNTER — Other Ambulatory Visit: Payer: Self-pay | Admitting: Cardiovascular Disease

## 2019-07-17 ENCOUNTER — Other Ambulatory Visit: Payer: Self-pay | Admitting: Internal Medicine

## 2019-07-20 ENCOUNTER — Other Ambulatory Visit: Payer: Self-pay | Admitting: Cardiovascular Disease

## 2019-07-29 ENCOUNTER — Other Ambulatory Visit: Payer: Self-pay

## 2019-07-29 ENCOUNTER — Encounter: Payer: Self-pay | Admitting: Internal Medicine

## 2019-07-29 ENCOUNTER — Ambulatory Visit (INDEPENDENT_AMBULATORY_CARE_PROVIDER_SITE_OTHER): Payer: Medicare Other | Admitting: Internal Medicine

## 2019-07-29 DIAGNOSIS — D582 Other hemoglobinopathies: Secondary | ICD-10-CM | POA: Diagnosis not present

## 2019-07-29 DIAGNOSIS — N320 Bladder-neck obstruction: Secondary | ICD-10-CM | POA: Diagnosis not present

## 2019-07-29 DIAGNOSIS — I10 Essential (primary) hypertension: Secondary | ICD-10-CM | POA: Diagnosis not present

## 2019-07-29 DIAGNOSIS — R7302 Impaired glucose tolerance (oral): Secondary | ICD-10-CM | POA: Diagnosis not present

## 2019-07-29 DIAGNOSIS — I2510 Atherosclerotic heart disease of native coronary artery without angina pectoris: Secondary | ICD-10-CM

## 2019-07-29 DIAGNOSIS — L570 Actinic keratosis: Secondary | ICD-10-CM

## 2019-07-29 DIAGNOSIS — E785 Hyperlipidemia, unspecified: Secondary | ICD-10-CM

## 2019-07-29 MED ORDER — LOSARTAN POTASSIUM 25 MG PO TABS: 25.0000 mg | ORAL_TABLET | Freq: Every day | ORAL | 3 refills | Status: DC

## 2019-07-29 MED ORDER — ATENOLOL 25 MG PO TABS: 25.0000 mg | ORAL_TABLET | Freq: Every day | ORAL | 3 refills | Status: DC

## 2019-07-29 MED ORDER — MECLIZINE HCL 12.5 MG PO TABS: ORAL_TABLET | ORAL | 1 refills | Status: DC

## 2019-07-29 NOTE — Progress Notes (Signed)
Subjective:  Patient ID: David [name], male    DOB: [birth year]  Age: 86 y.o. MRN: [id number]  CC: No chief complaint on file.   HPI David [name] presents for HTN, CAD, dyslipidemia f/u  Outpatient Medications Prior to Visit  Medication Sig Dispense Refill  . amoxicillin (AMOXIL) 500 MG capsule Take 2,000 mg by mouth See admin instructions. Take 4 capsules (2000 mg) by mouth one hour prior to dental appointments    . aspirin EC 81 MG tablet Take 81 mg by mouth daily.    Marland Kitchen atenolol (TENORMIN) 25 MG tablet TAKE ONE TABLET BY MOUTH ONE TIME DAILY 90 tablet 3  . Cholecalciferol (VITAMIN D3) 75 MCG (3000 UT) TABS Take 3,000 Units by mouth daily.    . ciclopirox (PENLAC) 8 % solution Apply topically at bedtime. Apply over nail and surrounding skin. Apply daily over previous coat. After seven (7) days, may remove with alcohol and continue cycle. 6.6 mL 0  . diclofenac sodium (VOLTAREN) 1 % GEL Apply 4 g topically 4 (four) times daily. 100 g 0  . ezetimibe (ZETIA) 10 MG tablet Take 1 tablet (10 mg total) by mouth daily. 90 tablet 3  . losartan (COZAAR) 25 MG tablet TAKE 1 TABLET BY MOUTH EVERY DAY 30 tablet 0  . meclizine (ANTIVERT) 12.5 MG tablet TAKE 1 TABLET BY MOUTH THREE TIMES A DAY AS NEEDED FOR DIZZINESS 60 tablet 0  . primidone (MYSOLINE) 50 MG tablet Take 1 tablet (50 mg total) by mouth 3 (three) times daily. 90 tablet 5  . furosemide (LASIX) 20 MG tablet Take 1 tablet (20 mg total) by mouth daily. (Patient not taking: Reported on 09/14/2018) 30 tablet 11   No facility-administered medications prior to visit.    ROS: Review of Systems  Constitutional: Negative for appetite change, fatigue and unexpected weight change.  HENT: Negative for congestion, nosebleeds, sneezing, sore throat and trouble swallowing.   Eyes: Negative for itching and visual disturbance.  Respiratory: Negative for cough.   Cardiovascular: Negative for chest pain, palpitations and leg swelling.    Gastrointestinal: Negative for abdominal distention, blood in stool, diarrhea and nausea.  Genitourinary: Negative for frequency and hematuria.  Musculoskeletal: Negative for back pain, gait problem, joint swelling and neck pain.  Skin: Negative for rash.  Neurological: Negative for dizziness, tremors, speech difficulty and weakness.  Psychiatric/Behavioral: Negative for agitation, dysphoric mood and sleep disturbance. The patient is not nervous/anxious.     Objective:  BP 140/74 (BP Location: Right Arm, Patient Position: Sitting, Cuff Size: Large)   Pulse 64   Temp 97.8 F (36.6 C) (Oral)   Ht 5\' 10"  (1.778 m)   Wt 207 lb (93.9 kg)   SpO2 99%   BMI 29.70 kg/m   BP Readings from Last 3 Encounters:  07/29/19 140/74  05/20/19 (!) 148/76  09/14/18 134/73    Wt Readings from Last 3 Encounters:  07/29/19 207 lb (93.9 kg)  05/20/19 205 lb (93 kg)  06/29/18 211 lb 4 oz (95.8 kg)    Physical Exam Constitutional:      General: He is not in acute distress.    Appearance: He is well-developed.     Comments: NAD  Eyes:     Conjunctiva/sclera: Conjunctivae normal.     Pupils: Pupils are equal, round, and reactive to light.  Neck:     Thyroid: No thyromegaly.     Vascular: No JVD.  Cardiovascular:     Rate and Rhythm: Normal rate and  regular rhythm.     Heart sounds: Normal heart sounds. No murmur. No friction rub. No gallop.   Pulmonary:     Effort: Pulmonary effort is normal. No respiratory distress.     Breath sounds: Normal breath sounds. No wheezing or rales.  Chest:     Chest wall: No tenderness.  Abdominal:     General: Bowel sounds are normal. There is no distension.     Palpations: Abdomen is soft. There is no mass.     Tenderness: There is no abdominal tenderness. There is no guarding or rebound.  Musculoskeletal:        General: No tenderness. Normal range of motion.     Cervical back: Normal range of motion.  Lymphadenopathy:     Cervical: No cervical  adenopathy.  Skin:    General: Skin is warm and dry.     Findings: No rash.  Neurological:     Mental Status: He is alert and oriented to person, place, and time.     Cranial Nerves: No cranial nerve deficit.     Motor: No abnormal muscle tone.     Coordination: Coordination normal.     Gait: Gait normal.     Deep Tendon Reflexes: Reflexes are normal and symmetric.  Psychiatric:        Behavior: Behavior normal.        Thought Content: Thought content normal.        Judgment: Judgment normal.     Lab Results  Component Value Date   WBC 6.2 05/20/2019   HGB 16.3 05/20/2019   HCT 48.3 05/20/2019   PLT 93.0 (L) 05/20/2019   GLUCOSE 108 (H) 05/20/2019   CHOL 183 05/20/2019   TRIG 76.0 05/20/2019   HDL 45.30 05/20/2019   LDLDIRECT 136.2 08/01/2007   LDLCALC 122 (H) 05/20/2019   ALT 11 05/20/2019   AST 16 05/20/2019   NA 141 05/20/2019   K 4.3 05/20/2019   CL 104 05/20/2019   CREATININE 1.39 05/20/2019   BUN 21 05/20/2019   CO2 29 05/20/2019   TSH 3.38 05/20/2019   PSA 4.88 (H) 05/20/2019   INR 1.0 01/08/2007   HGBA1C 5.8 06/06/2016    DG Chest 2 View  Result Date: 09/14/2018 CLINICAL DATA:  Pain EXAM: CHEST - 2 VIEW COMPARISON:  01/04/2007 FINDINGS: Prior CABG. Scarring in the lingula. No acute confluent airspace opacities or effusions. Heart is borderline in size. No acute bony abnormality. IMPRESSION: No active cardiopulmonary disease. Electronically Signed   By: Rolm Baptise M.D.   On: 09/14/2018 22:24   CT Angio Chest PE W and/or Wo Contrast  Result Date: 09/15/2018 CLINICAL DATA:  Initial evaluation for progressive thoracic back pain extending into chest. EXAM: CT ANGIOGRAPHY CHEST WITH CONTRAST CT THORACIC SPINE WITHOUT CONTRAST TECHNIQUE: Multidetector CT imaging of the chest was performed using the standard protocol during bolus administration of intravenous contrast. Multiplanar CT image reconstructions and MIPs were obtained to evaluate the vascular anatomy.  CONTRAST:  184mL OMNIPAQUE IOHEXOL 350 MG/ML SOLN COMPARISON:  Comparison made with prior radiograph from earlier the same day. FINDINGS: Cardiovascular: Normal in caliber without aneurysm or other acute finding. Moderate aortic atherosclerosis. Partially visualized great vessels mildly tortuous but within normal limits. 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 Mild cardiomegaly. Diffuse 3 vessel coronary artery calcifications. No pericardial effusion. Pulmonary arterial tree adequately opacified for evaluation. Main pulmonary artery within normal limits for caliber. 8 x 7 mm focal filling defect adjacent to the  pulmonic valve could reflect a small focus of thrombus (series 5, image 209). This appears adherent to the valve itself. No filling defect seen elsewhere along the pulmonary tree to suggest acute pulmonary embolism. Re-formatted imaging confirms these findings. Mediastinum/Nodes: Thyroid within normal limits. Mildly prominent 13 mm pretracheal lymph node noted. 11 mm noted at the 80 recess. No other pathologically enlarged mediastinal lymph nodes. 11 mm left infrahilar node noted. No right hilar adenopathy. No enlarged axillary lymph nodes. Esophagus within normal limits. Lungs/Pleura: Irregular soft tissue density along the right lateral wall of the subglottic trachea likely reflects a small amount of adherent secretions. Tracheobronchial tree otherwise patent. Scattered atelectatic changes seen dependently within the lung bases bilaterally. Probable underlying COPD. Trace bilateral pleural effusions. No pulmonary edema. No consolidative opacity. No pneumothorax. No worrisome pulmonary nodule or mass. Upper Abdomen: Visualized upper abdomen demonstrates no acute finding. Few scattered left renal cysts noted, largest of which measures 3.5 cm. Musculoskeletal: External soft tissues demonstrate no acute finding. No acute osseous finding. No discrete lytic or blastic osseous lesions. Review of  the MIP images confirms the above findings. CT THORACIC SPINE FINDINGS Vertebral bodies normally aligned with preservation of the normal thoracic kyphosis. No listhesis or malalignment. Vertebral body heights maintained without evidence for acute or chronic fracture. No discrete or worrisome osseous lesions. Paraspinous soft tissues demonstrate no acute finding. No appreciable disc protrusion within the thoracic spine. Spinal canal widely patent. Mild facet hypertrophy within the lower thoracic spine. No significant foraminal stenosis. Diffusely flowing endplate osteophytic spurring seen along the right anterolateral aspect of the mid and lower thoracic spine. IMPRESSION: CT CHEST IMPRESSION 1. No CT evidence for acute pulmonary embolism. 2. 8 mm focal filling defect adjacent to the pulmonic valve, likely a small amount of thrombus adherent to the valve itself. Finding places this patient at risk for pulmonary embolism. Correlation with dedicated echocardiogram suggested. 3. Underlying COPD with bibasilar atelectasis. No other active cardiopulmonary disease. 4. Moderate atherosclerosis with diffuse 3 vessel coronary artery calcifications. Sequelae of prior AVR and CABG. CT THORACIC SPINE IMPRESSION No acute traumatic injury or other abnormality within the thoracic spine. Electronically Signed   By: Jeannine Boga M.D.   On: 09/15/2018 00:32   CT T-SPINE NO CHARGE  Result Date: 09/15/2018 CLINICAL DATA:  Initial evaluation for progressive thoracic back pain extending into chest. EXAM: CT ANGIOGRAPHY CHEST WITH CONTRAST CT THORACIC SPINE WITHOUT CONTRAST TECHNIQUE: Multidetector CT imaging of the chest was performed using the standard protocol during bolus administration of intravenous contrast. Multiplanar CT image reconstructions and MIPs were obtained to evaluate the vascular anatomy. CONTRAST:  159mL OMNIPAQUE IOHEXOL 350 MG/ML SOLN COMPARISON:  Comparison made with prior radiograph from earlier the same  day. FINDINGS: Cardiovascular: Normal in caliber without aneurysm or other acute finding. Moderate aortic atherosclerosis. Partially visualized great vessels mildly tortuous but within normal limits. 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 Mild cardiomegaly. Diffuse 3 vessel coronary artery calcifications. No pericardial effusion. Pulmonary arterial tree adequately opacified for evaluation. Main pulmonary artery within normal limits for caliber. 8 x 7 mm focal filling defect adjacent to the pulmonic valve could reflect a small focus of thrombus (series 5, image 209). This appears adherent to the valve itself. No filling defect seen elsewhere along the pulmonary tree to suggest acute pulmonary embolism. Re-formatted imaging confirms these findings. Mediastinum/Nodes: Thyroid within normal limits. Mildly prominent 13 mm pretracheal lymph node noted. 11 mm noted at the 80 recess. No other pathologically enlarged  mediastinal lymph nodes. 11 mm left infrahilar node noted. No right hilar adenopathy. No enlarged axillary lymph nodes. Esophagus within normal limits. Lungs/Pleura: Irregular soft tissue density along the right lateral wall of the subglottic trachea likely reflects a small amount of adherent secretions. Tracheobronchial tree otherwise patent. Scattered atelectatic changes seen dependently within the lung bases bilaterally. Probable underlying COPD. Trace bilateral pleural effusions. No pulmonary edema. No consolidative opacity. No pneumothorax. No worrisome pulmonary nodule or mass. Upper Abdomen: Visualized upper abdomen demonstrates no acute finding. Few scattered left renal cysts noted, largest of which measures 3.5 cm. Musculoskeletal: External soft tissues demonstrate no acute finding. No acute osseous finding. No discrete lytic or blastic osseous lesions. Review of the MIP images confirms the above findings. CT THORACIC SPINE FINDINGS Vertebral bodies normally aligned with preservation  of the normal thoracic kyphosis. No listhesis or malalignment. Vertebral body heights maintained without evidence for acute or chronic fracture. No discrete or worrisome osseous lesions. Paraspinous soft tissues demonstrate no acute finding. No appreciable disc protrusion within the thoracic spine. Spinal canal widely patent. Mild facet hypertrophy within the lower thoracic spine. No significant foraminal stenosis. Diffusely flowing endplate osteophytic spurring seen along the right anterolateral aspect of the mid and lower thoracic spine. IMPRESSION: CT CHEST IMPRESSION 1. No CT evidence for acute pulmonary embolism. 2. 8 mm focal filling defect adjacent to the pulmonic valve, likely a small amount of thrombus adherent to the valve itself. Finding places this patient at risk for pulmonary embolism. Correlation with dedicated echocardiogram suggested. 3. Underlying COPD with bibasilar atelectasis. No other active cardiopulmonary disease. 4. Moderate atherosclerosis with diffuse 3 vessel coronary artery calcifications. Sequelae of prior AVR and CABG. CT THORACIC SPINE IMPRESSION No acute traumatic injury or other abnormality within the thoracic spine. Electronically Signed   By: Jeannine Boga M.D.   On: 09/15/2018 00:32    Assessment & Plan:    Walker Kehr, MD

## 2019-07-29 NOTE — Assessment & Plan Note (Signed)
Colin Rhein

## 2019-07-29 NOTE — Assessment & Plan Note (Signed)
Atenolol, ASA, Zetia

## 2019-07-29 NOTE — Assessment & Plan Note (Signed)
PSA

## 2019-07-29 NOTE — Assessment & Plan Note (Signed)
Losartan, Atenolol, Lasix

## 2019-07-29 NOTE — Assessment & Plan Note (Signed)
Worse - hands, face Derm ref

## 2019-07-29 NOTE — Assessment & Plan Note (Signed)
Labs

## 2019-07-29 NOTE — Assessment & Plan Note (Signed)
CBC

## 2019-07-30 ENCOUNTER — Encounter: Payer: Self-pay | Admitting: Internal Medicine

## 2019-07-30 ENCOUNTER — Encounter: Payer: Self-pay | Admitting: Cardiovascular Disease

## 2019-07-30 ENCOUNTER — Ambulatory Visit (INDEPENDENT_AMBULATORY_CARE_PROVIDER_SITE_OTHER): Payer: Medicare Other | Admitting: Cardiovascular Disease

## 2019-07-30 VITALS — BP 122/72 | HR 56 | Temp 96.8°F | Ht 70.0 in | Wt 210.2 lb

## 2019-07-30 DIAGNOSIS — Z951 Presence of aortocoronary bypass graft: Secondary | ICD-10-CM

## 2019-07-30 DIAGNOSIS — I2510 Atherosclerotic heart disease of native coronary artery without angina pectoris: Secondary | ICD-10-CM

## 2019-07-30 DIAGNOSIS — I10 Essential (primary) hypertension: Secondary | ICD-10-CM | POA: Diagnosis not present

## 2019-07-30 DIAGNOSIS — Z952 Presence of prosthetic heart valve: Secondary | ICD-10-CM | POA: Diagnosis not present

## 2019-07-30 DIAGNOSIS — D151 Benign neoplasm of heart: Secondary | ICD-10-CM

## 2019-07-30 DIAGNOSIS — E785 Hyperlipidemia, unspecified: Secondary | ICD-10-CM

## 2019-07-30 DIAGNOSIS — N1831 Chronic kidney disease, stage 3a: Secondary | ICD-10-CM | POA: Diagnosis not present

## 2019-07-30 NOTE — Progress Notes (Signed)
Patient ID: DAKARRI KESSINGER, male   DOB: 07/23/1923, 86 y.o.   MRN: [id number]    Primary M.D. Dr. Alain Marion  PATIENT PROFILE: David [name] is a 86 y.o. male who presents to the office to establish cardiology care with me.  He is a former patient of Dr. Rollene Fare.  He establish care with me in June 2015.  I last saw him in October 2019.  He presents for follow-up evaluation.  HPI:  David [name] is a 86 y.o. male who has a history of hyperlipidemia, and valvular heart disease.  In 2000, he underwent CABG revascularization surgery and had a LIMA placed to his LAD, SVG to diagonal vessel, sequential vein graft to 2 marginal branches of his left circumflex artery, and a vein graft to the distal right coronary artery.  At that time, he had a porcine aortic valve replacement for aortic stenosis.  A repeat cardiac catheterization in August 2008, showed significant native CAD, but with patent grafts. A nuclear perfusion study in 2011, remained low risk.  An echo Doppler study in December 2013 showed moderate left ventricular hypertrophy with an ejection fraction of 50-55%.  His aortic bioprosthesis remained competent with suggestion of very mild stenosis.  The peak gradient was 19 mm.  Valve area 1.5 cm.  He had mild carotid bruits and has undergone carotid studies.  He denies any recent anginal symptoms or palpitations.  He denies any episodes of syncope or presyncope.  He does have a history of hyperlipidemia, for which he has been on Zetia.  He has been intolerant to statins.     He underwent a follow-up echo Doppler study on 11/23/2015.  This showed an ejection fraction at 55-60%.  He did not have regional wall motion abnormalities.  There was grade 2 diastolic dysfunction.  His aortic valve bioprosthesis was well-seated.  His bioprosthetic valve had a peak gradient of 19 mm and mean gradient of 10 mm, which does not appear to be significantly changed from 2013.  There was mitral annular calcification.   His ascending aorta was upper normal to mildly increased.  There was trivial TR and PR.    I saw him in April 2017 and last saw him in October 2019 at which time he continued to do remarkably well.  He underwent a 2-year follow-up echo Doppler study on December 15, 2017.  This revealed moderate LVH with normal systolic function and EF estimated 60 to 65%.  His aortic valve prosthesis was well-seated.  The mean gradient was 12 with a peak gradient of 21, essentially unchanged from previously.  There was trivial AR.  There was a mass present on the ventricular surface of his pulmonic valve.  I reviewed this with Dr. 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  It measured approximately 9 x 8 mm.  Retrospectively it was present also on the echo of 2017.  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  Since I last saw him, he underwent a follow-up echo Doppler study on December 25, 2018.  This revealed normal systolic function with EF 60 to 65%.  There was severe basal septal hypertrophy, grade 1 diastolic dysfunction and elevated left atrial and left ventricular end-diastolic pressure.  There was mild mitral annular calcification.  His bioprosthetic aortic valve was functioning normally.  The mean gradient was 13 and there was no perivalvular AR.  There was mild dilation of his ascending aorta.  On that echo there was no mention of his  previously noted papillary fibroblastoma.  Presently, he continues to feel well.  He will be turning 89 in 2 weeks.  He denies chest pain or shortness of breath.  His daughter lives with him at home.  He presents for reevaluation.  Past Medical History:  Diagnosis Date  . CAD (coronary artery disease)    intolerance of statins  . COPD (chronic obstructive pulmonary disease) ([place])   . Current use of long term anticoagulation   . GERD (gastroesophageal reflux disease)   . Hyperlipidemia   . Hypertension   . Impaired glucose tolerance 02/14/2011  . LBP (low  back pain)   . OA (osteoarthritis)   . 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    Past Surgical History:  Procedure Laterality Date  . 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 CARDIAC CATHETERIZATION  10/05/1998   Recommend CABG revascularizaition  . CARDIAC CATHETERIZATION  06/11/1999   Continue medical therapy  . CARDIAC CATHETERIZATION  01/08/2007   Patent grafts, recommend medical therapy  . CARDIOVASCULAR STRESS TEST  04/22/2010   Small lateral defect which is partially reversible, no ischemic EKG changes were noted.  Marland Kitchen CAROTID DOPPLER  04/22/2010   Bilateral ICAs-no evidence of diametere reduction, significant tortuosity, or any other vascular abnormality.  . CORONARY ARTERY BYPASS GRAFT  10/09/1998   LIMA to LAD, SVG to diag, SVG sequential to 2 marginals, SVG sequential to PDA & PLA. CE#25 porcine AVR.  Marland Kitchen TRANSTHORACIC ECHOCARDIOGRAM  05/14/2012   EF 50-55%, bioprosthesis of the aortic valve with well preserved LV function, moderate concentric Sutton    Allergies  Allergen Reactions  . Atorvastatin Other (See Comments)    Unknown reaction  . Colesevelam Other (See Comments)    Unknown reaction  . Simvastatin Other (See Comments)    Unknown reaction  . Sulfa Antibiotics Other (See Comments)    Unknown reaction    Current Outpatient Medications  Medication Sig Dispense Refill  . amoxicillin (AMOXIL) 500 MG capsule Take 2,000 mg by mouth See admin instructions. Take 4 capsules (2000 mg) by mouth one hour prior to dental appointments    . aspirin EC 81 MG tablet Take 81 mg by mouth daily.    Marland Kitchen atenolol (TENORMIN) 25 MG tablet Take 1 tablet (25 mg total) by mouth daily. 90 tablet 3  . Cholecalciferol (VITAMIN D3) 75 MCG (3000 UT) TABS Take 3,000 Units by mouth daily.    . ciclopirox (PENLAC) 8 % solution Apply topically at bedtime. Apply over nail and surrounding skin. Apply daily over previous coat. After seven (7) days, may remove with alcohol and  continue cycle. 6.6 mL 0  . diclofenac sodium (VOLTAREN) 1 % GEL Apply 4 g topically 4 (four) times daily. 100 g 0  . ezetimibe (ZETIA) 10 MG tablet Take 1 tablet (10 mg total) by mouth daily. 90 tablet 3  . losartan (COZAAR) 25 MG tablet Take 1 tablet (25 mg total) by mouth daily. 90 tablet 3  . meclizine (ANTIVERT) 12.5 MG tablet 1 po tid prn 60 tablet 1  . furosemide (LASIX) 20 MG tablet Take 1 tablet (20 mg total) by mouth daily. (Patient not taking: Reported on 09/14/2018) 30 tablet 11   No current facility-administered medications for this visit.    Socially, he is widowed.  4 children 6 grandchildren, and 12 great-grandchildren.  He does walk.  There is no tobacco use.  Family History  Problem Relation Age of Onset  . Coronary  artery disease Other   . Hypertension Other     ROS General: Negative; No fevers, chills, or night sweats HEENT: Negative; No changes in vision or hearing, sinus congestion, difficulty swallowing Pulmonary: Negative; No cough, wheezing, shortness of breath, hemoptysis Cardiovascular:  See HPI; No chest pain, presyncope, syncope, palpitation GI: Positive for ventral hernia; No nausea, vomiting, diarrhea, or abdominal pain GU: Negative; No dysuria, hematuria, or difficulty voiding Musculoskeletal: Negative; no myalgias, joint pain, or weakness Hematologic/Oncologic: Negative; no easy bruising, bleeding Endocrine: Negative; no heat/cold intolerance; no diabetes Neuro: Negative; no changes in balance, headaches Skin: Negative; No rashes or skin lesions Psychiatric: Negative; No behavioral problems, depression Sleep: Negative; No daytime sleepiness, hypersomnolence, bruxism, restless legs, hypnogagnic hallucinations Other comprehensive 14 point system review is negative   Physical Exam BP 122/72 (BP Location: Left Arm, Patient Position: Sitting, Cuff Size: Large)   Pulse (!) 56   Temp (!) 96.8 F (36 C)   Ht '5\' 10"'  (1.778 m)   Wt 210 lb 3.2 oz (95.3 kg)    SpO2 98%   BMI 30.16 kg/m    Repeat blood pressure by me was 140/78  Wt Readings from Last 3 Encounters:  07/30/19 210 lb 3.2 oz (95.3 kg)  07/29/19 207 lb (93.9 kg)  05/20/19 205 lb (93 kg)   General: Alert, oriented, no distress.  Appears younger than stated age Skin: normal turgor, no rashes, warm and dry HEENT: Normocephalic, atraumatic. Pupils equal round and reactive to light; sclera anicteric; extraocular muscles intact;  Nose without nasal septal hypertrophy Mouth/Parynx benign; Mallinpatti scale 3 Neck: No JVD, no carotid bruits; normal carotid upstroke Lungs: clear to ausculatation and percussion; no wheezing or rales Chest wall: without tenderness to palpitation Heart: PMI not displaced, RRR, s1 s2 normal, 2/6 systolic murmur, no diastolic murmur, no rubs, gallops, thrills, or heaves Abdomen: soft, nontender; no hepatosplenomehaly, BS+; abdominal aorta nontender and not dilated by palpation. Back: no CVA tenderness Pulses 2+ Musculoskeletal: full range of motion, normal strength, no joint deformities Extremities: no clubbing cyanosis or edema, Homan's sign negative  Neurologic: grossly nonfocal; Cranial nerves grossly wnl Psychologic: Normal mood and affect   ECG (independently read by me): Normal sinus rhythm at 64 bpm.  Left bundle branch block.  No ectopy.  October 2019 ECG (independently read by me): Normal sinus rhythm.  Nonspecific intraventricular block.  No ectopy.  July 2017 ECG (independently read by me): Normal sinus rhythm at 62 bpm.  Left bundle-branch block with repolarization changes.  June 2015 ECG (independently read by me): Sinus rhythm at 70 beats per minute.  Left bundle branch block.  QTc interval 464 ms.  LABS: BMP Latest Ref Rng & Units 05/20/2019 09/14/2018 09/14/2018  Glucose 70 - 99 mg/dL 108(H) - 129(H)  BUN 6 - 23 mg/dL 21 - 19  Creatinine 0.40 - 1.50 mg/dL 1.39 1.40(H) 1.46(H)  Sodium 135 - 145 mEq/L 141 - 142  Potassium 3.5 - 5.1 mEq/L  4.3 - 4.1  Chloride 96 - 112 mEq/L 104 - 105  CO2 19 - 32 mEq/L 29 - 25  Calcium 8.4 - 10.5 mg/dL 9.4 - 9.5   Hepatic Function Latest Ref Rng & Units 05/20/2019 06/07/2018 06/05/2017  Total Protein 6.0 - 8.3 g/dL 7.3 6.9 7.6  Albumin 3.5 - 5.2 g/dL 4.2 4.0 4.4  AST 0 - 37 U/L '16 17 19  ' ALT 0 - 53 U/L '11 13 13  ' Alk Phosphatase 39 - 117 U/L 52 43 47  Total Bilirubin 0.2 -  1.2 mg/dL 0.9 1.0 0.9  Bilirubin, Direct 0.0 - 0.3 mg/dL 0.1 0.2 0.1   CBC Latest Ref Rng & Units 05/20/2019 09/14/2018 06/07/2018  WBC 4.0 - 10.5 K/uL 6.2 7.0 6.4  Hemoglobin 13.0 - 17.0 g/dL 16.3 16.5 15.6  Hematocrit 39.0 - 52.0 % 48.3 50.8 45.7  Platelets 150.0 - 400.0 K/uL 93.0(L) 107(L) 105.0(L)   Lab Results  Component Value Date   MCV 91.8 05/20/2019   MCV 92.7 09/14/2018   MCV 91.6 06/07/2018    Lab Results  Component Value Date   TSH 3.38 05/20/2019   Lab Results  Component Value Date   HGBA1C 5.8 06/06/2016   Lipid Panel     Component Value Date/Time   CHOL 183 05/20/2019 1010   TRIG 76.0 05/20/2019 1010   HDL 45.30 05/20/2019 1010   CHOLHDL 4 05/20/2019 1010   VLDL 15.2 05/20/2019 1010   LDLCALC 122 (H) 05/20/2019 1010   LDLDIRECT 136.2 08/01/2007 0856    RADIOLOGY: No results found.  IMPRESSION: 1. S/P AVR (aortic valve replacement)   2. CAD in native artery   3. Hx of CABG   4. Essential hypertension   5. Hyperlipidemia with target LDL less than 70   6. Papillary fibroelastoma   7. Stage 3a chronic kidney disease      ASSESSMENT AND PLAN: [name] is a young appearing 86 year old gentleman who underwent CABG revascularization surgery and aortic valve replacement with a bioprosthetic aortic valve in 2000.  At his last catheterization in 2008 he had patent grafts.  His last nuclear study was low risk.  He has continued to be asymptomatic and specifically denies any chest pain or change in shortness of breath.  He continues to be fairly mobile and remains fairly active.  His most  recent echo Doppler study continues to show normal systolic function with mild diastolic dysfunction.  His bioprosthetic aortic valve continues to be functioning normally and there is no significant perivalvular leak.  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  This was not mentioned on his most recent echo assessment in August 2020.  According to his daughter blood pressure at home typically is in the 120s to 130s.  He continues to be on low-dose atenolol 25 mg, losartan 25 mg, and has a prescription for furosemide 20 mg which he takes on an as-needed basis for leg swelling.  Presently there is no edema today.  He resting heart rate is well controlled at 64.  He has chronic left bundle branch block.  He has a history of statin intolerance and is continue to tolerate Zetia 10 mg daily.  He sees Dr. Alain Marion for his primary care.  Lipid studies in January 2021 showed a total cholesterol 183, triglycerides 76, HDL 45 and LDL 122.  Serum creatinine was 1.39 with an estimated GFR at 47 placing him in a stage III chronic kidney disease category.  He was mild noted to have mild PSA increase at 4.88, followed by Dr. Alain Marion.  I discussed the importance of continued hydration.  He will monitor his blood pressure at home.  I have recommended in 6 months he undergo a 1 year follow-up echo Doppler study to reassess his bioprosthetic valve, 21 years after insertion.  I will see him in follow-up and further recommendations will be made at that time.    Vernona Peake A.  Claiborne Billings, MD, [facility] 07/31/2019 8:50 AM

## 2019-07-30 NOTE — Patient Instructions (Signed)
Medication Instructions:  CONTINUE WITH CURRENT MEDICATIONS. NO CHANGES.  *If you need a refill on your cardiac medications before your next appointment, please call your pharmacy*  Testing/Procedures: IN September 2021 Your physician has requested that you have an echocardiogram. Echocardiography is a painless test that uses sound waves to create images of your heart. It provides your doctor with information about the size and shape of your heart and how well your heart's chambers and valves are working. This procedure takes approximately one hour. There are no restrictions for this procedure.  [place]   Follow-Up: At [facility], you and your health needs are our priority.  As part of our continuing mission to provide you with exceptional heart care, we have created designated Provider Care Teams.  These Care Teams include your primary Cardiologist (physician) and Advanced Practice Providers (APPs -  Physician Assistants and Nurse Practitioners) who all work together to provide you with the care you need, when you need it.  We recommend signing up for the patient portal called "MyChart".  Sign up information is provided on this After Visit Summary.  MyChart is used to connect with patients for Virtual Visits (Telemedicine).  Patients are able to view lab/test results, encounter notes, upcoming appointments, etc.  Non-urgent messages can be sent to your provider as well.   To learn more about what you can do with MyChart, go to [email or web address].    Your next appointment:   6-7 month(s)  The format for your next appointment:   In Person  Provider:   Shelva Majestic, MD

## 2019-07-31 ENCOUNTER — Encounter: Payer: Self-pay | Admitting: Cardiovascular Disease

## 2019-08-19 ENCOUNTER — Ambulatory Visit: Payer: Medicare Other | Admitting: Internal Medicine

## 2019-09-04 ENCOUNTER — Emergency Department (HOSPITAL_COMMUNITY): Payer: Medicare Other

## 2019-09-04 ENCOUNTER — Inpatient Hospital Stay (HOSPITAL_COMMUNITY): Payer: Medicare Other

## 2019-09-04 ENCOUNTER — Inpatient Hospital Stay (HOSPITAL_COMMUNITY)
Admission: EM | Admit: 2019-09-04 | Discharge: 2019-09-09 | DRG: 064 | Disposition: A | Discharge status: Rehabilitation Facility | Payer: Medicare Other | Attending: Internal Medicine | Admitting: Internal Medicine

## 2019-09-04 ENCOUNTER — Other Ambulatory Visit: Payer: Self-pay

## 2019-09-04 ENCOUNTER — Encounter (HOSPITAL_COMMUNITY): Payer: Self-pay | Admitting: Student in an Organized Health Care Education/Training Program

## 2019-09-04 DIAGNOSIS — I10 Essential (primary) hypertension: Secondary | ICD-10-CM | POA: Diagnosis present

## 2019-09-04 DIAGNOSIS — I69118 Other symptoms and signs involving cognitive functions following nontraumatic intracerebral hemorrhage: Secondary | ICD-10-CM | POA: Diagnosis not present

## 2019-09-04 DIAGNOSIS — G8191 Hemiplegia, unspecified affecting right dominant side: Secondary | ICD-10-CM | POA: Diagnosis present

## 2019-09-04 DIAGNOSIS — Z888 Allergy status to other drugs, medicaments and biological substances status: Secondary | ICD-10-CM

## 2019-09-04 DIAGNOSIS — I69151 Hemiplegia and hemiparesis following nontraumatic intracerebral hemorrhage affecting right dominant side: Secondary | ICD-10-CM | POA: Diagnosis not present

## 2019-09-04 DIAGNOSIS — Z951 Presence of aortocoronary bypass graft: Secondary | ICD-10-CM

## 2019-09-04 DIAGNOSIS — I69191 Dysphagia following nontraumatic intracerebral hemorrhage: Secondary | ICD-10-CM | POA: Diagnosis not present

## 2019-09-04 DIAGNOSIS — D696 Thrombocytopenia, unspecified: Secondary | ICD-10-CM | POA: Diagnosis not present

## 2019-09-04 DIAGNOSIS — R1313 Dysphagia, pharyngeal phase: Secondary | ICD-10-CM | POA: Diagnosis present

## 2019-09-04 DIAGNOSIS — I379 Nonrheumatic pulmonary valve disorder, unspecified: Secondary | ICD-10-CM | POA: Diagnosis not present

## 2019-09-04 DIAGNOSIS — E669 Obesity, unspecified: Secondary | ICD-10-CM | POA: Diagnosis present

## 2019-09-04 DIAGNOSIS — R05 Cough: Secondary | ICD-10-CM

## 2019-09-04 DIAGNOSIS — I69391 Dysphagia following cerebral infarction: Secondary | ICD-10-CM | POA: Diagnosis not present

## 2019-09-04 DIAGNOSIS — K219 Gastro-esophageal reflux disease without esophagitis: Secondary | ICD-10-CM | POA: Diagnosis present

## 2019-09-04 DIAGNOSIS — G936 Cerebral edema: Secondary | ICD-10-CM | POA: Diagnosis present

## 2019-09-04 DIAGNOSIS — I69192 Facial weakness following nontraumatic intracerebral hemorrhage: Secondary | ICD-10-CM | POA: Diagnosis not present

## 2019-09-04 DIAGNOSIS — R7302 Impaired glucose tolerance (oral): Secondary | ICD-10-CM | POA: Diagnosis present

## 2019-09-04 DIAGNOSIS — J449 Chronic obstructive pulmonary disease, unspecified: Secondary | ICD-10-CM | POA: Diagnosis present

## 2019-09-04 DIAGNOSIS — R29720 NIHSS score 20: Secondary | ICD-10-CM | POA: Diagnosis present

## 2019-09-04 DIAGNOSIS — I619 Nontraumatic intracerebral hemorrhage, unspecified: Secondary | ICD-10-CM | POA: Diagnosis present

## 2019-09-04 DIAGNOSIS — R29810 Facial weakness: Secondary | ICD-10-CM | POA: Diagnosis present

## 2019-09-04 DIAGNOSIS — R0989 Other specified symptoms and signs involving the circulatory and respiratory systems: Secondary | ICD-10-CM | POA: Diagnosis present

## 2019-09-04 DIAGNOSIS — R471 Dysarthria and anarthria: Secondary | ICD-10-CM | POA: Diagnosis present

## 2019-09-04 DIAGNOSIS — Z7901 Long term (current) use of anticoagulants: Secondary | ICD-10-CM

## 2019-09-04 DIAGNOSIS — I610 Nontraumatic intracerebral hemorrhage in hemisphere, subcortical: Principal | ICD-10-CM | POA: Diagnosis present

## 2019-09-04 DIAGNOSIS — I161 Hypertensive emergency: Secondary | ICD-10-CM | POA: Diagnosis present

## 2019-09-04 DIAGNOSIS — N179 Acute kidney failure, unspecified: Secondary | ICD-10-CM | POA: Diagnosis not present

## 2019-09-04 DIAGNOSIS — Z20822 Contact with and (suspected) exposure to covid-19: Secondary | ICD-10-CM | POA: Diagnosis present

## 2019-09-04 DIAGNOSIS — Z953 Presence of xenogenic heart valve: Secondary | ICD-10-CM

## 2019-09-04 DIAGNOSIS — I447 Left bundle-branch block, unspecified: Secondary | ICD-10-CM | POA: Diagnosis present

## 2019-09-04 DIAGNOSIS — Z79899 Other long term (current) drug therapy: Secondary | ICD-10-CM

## 2019-09-04 DIAGNOSIS — Z952 Presence of prosthetic heart valve: Secondary | ICD-10-CM | POA: Diagnosis not present

## 2019-09-04 DIAGNOSIS — R1312 Dysphagia, oropharyngeal phase: Secondary | ICD-10-CM | POA: Diagnosis not present

## 2019-09-04 DIAGNOSIS — R4701 Aphasia: Secondary | ICD-10-CM | POA: Diagnosis present

## 2019-09-04 DIAGNOSIS — I6389 Other cerebral infarction: Secondary | ICD-10-CM | POA: Diagnosis not present

## 2019-09-04 DIAGNOSIS — I2510 Atherosclerotic heart disease of native coronary artery without angina pectoris: Secondary | ICD-10-CM | POA: Diagnosis present

## 2019-09-04 DIAGNOSIS — R059 Cough, unspecified: Secondary | ICD-10-CM

## 2019-09-04 DIAGNOSIS — M545 Low back pain: Secondary | ICD-10-CM | POA: Diagnosis not present

## 2019-09-04 DIAGNOSIS — I69120 Aphasia following nontraumatic intracerebral hemorrhage: Secondary | ICD-10-CM | POA: Diagnosis not present

## 2019-09-04 DIAGNOSIS — Z7982 Long term (current) use of aspirin: Secondary | ICD-10-CM

## 2019-09-04 DIAGNOSIS — J690 Pneumonitis due to inhalation of food and vomit: Secondary | ICD-10-CM | POA: Diagnosis not present

## 2019-09-04 DIAGNOSIS — I739 Peripheral vascular disease, unspecified: Secondary | ICD-10-CM | POA: Diagnosis present

## 2019-09-04 DIAGNOSIS — I69122 Dysarthria following nontraumatic intracerebral hemorrhage: Secondary | ICD-10-CM | POA: Diagnosis not present

## 2019-09-04 DIAGNOSIS — F329 Major depressive disorder, single episode, unspecified: Secondary | ICD-10-CM | POA: Diagnosis not present

## 2019-09-04 DIAGNOSIS — G8929 Other chronic pain: Secondary | ICD-10-CM | POA: Diagnosis not present

## 2019-09-04 DIAGNOSIS — K5900 Constipation, unspecified: Secondary | ICD-10-CM | POA: Diagnosis present

## 2019-09-04 DIAGNOSIS — K5901 Slow transit constipation: Secondary | ICD-10-CM | POA: Diagnosis not present

## 2019-09-04 DIAGNOSIS — R531 Weakness: Secondary | ICD-10-CM | POA: Diagnosis not present

## 2019-09-04 DIAGNOSIS — R1310 Dysphagia, unspecified: Secondary | ICD-10-CM | POA: Diagnosis present

## 2019-09-04 DIAGNOSIS — I69112 Visuospatial deficit and spatial neglect following nontraumatic intracerebral hemorrhage: Secondary | ICD-10-CM | POA: Diagnosis not present

## 2019-09-04 DIAGNOSIS — R251 Tremor, unspecified: Secondary | ICD-10-CM | POA: Diagnosis not present

## 2019-09-04 DIAGNOSIS — Z87891 Personal history of nicotine dependence: Secondary | ICD-10-CM

## 2019-09-04 DIAGNOSIS — R7301 Impaired fasting glucose: Secondary | ICD-10-CM | POA: Diagnosis present

## 2019-09-04 DIAGNOSIS — R001 Bradycardia, unspecified: Secondary | ICD-10-CM | POA: Diagnosis not present

## 2019-09-04 DIAGNOSIS — E785 Hyperlipidemia, unspecified: Secondary | ICD-10-CM | POA: Diagnosis present

## 2019-09-04 DIAGNOSIS — R21 Rash and other nonspecific skin eruption: Secondary | ICD-10-CM | POA: Diagnosis not present

## 2019-09-04 DIAGNOSIS — Z8249 Family history of ischemic heart disease and other diseases of the circulatory system: Secondary | ICD-10-CM | POA: Diagnosis not present

## 2019-09-04 DIAGNOSIS — Z66 Do not resuscitate: Secondary | ICD-10-CM | POA: Diagnosis present

## 2019-09-04 DIAGNOSIS — F09 Unspecified mental disorder due to known physiological condition: Secondary | ICD-10-CM | POA: Diagnosis not present

## 2019-09-04 DIAGNOSIS — Z882 Allergy status to sulfonamides status: Secondary | ICD-10-CM

## 2019-09-04 DIAGNOSIS — I69320 Aphasia following cerebral infarction: Secondary | ICD-10-CM | POA: Diagnosis not present

## 2019-09-04 DIAGNOSIS — R7309 Other abnormal glucose: Secondary | ICD-10-CM | POA: Diagnosis not present

## 2019-09-04 DIAGNOSIS — F0789 Other personality and behavioral disorders due to known physiological condition: Secondary | ICD-10-CM | POA: Diagnosis not present

## 2019-09-04 LAB — COMPREHENSIVE METABOLIC PANEL
ALT: 19 U/L (ref 0–44)
AST: 35 U/L (ref 15–41)
Albumin: 3.8 g/dL (ref 3.5–5.0)
Alkaline Phosphatase: 46 U/L (ref 38–126)
Anion gap: 10 (ref 5–15)
BUN: 22 mg/dL (ref 8–23)
CO2: 23 mmol/L (ref 22–32)
Calcium: 9 mg/dL (ref 8.9–10.3)
Chloride: 106 mmol/L (ref 98–111)
Creatinine, Ser: 1.43 mg/dL — ABNORMAL HIGH (ref 0.61–1.24)
GFR calc Af Amer: 48 mL/min — ABNORMAL LOW (ref >60)
GFR calc non Af Amer: 42 mL/min — ABNORMAL LOW (ref >60)
Glucose, Bld: 143 mg/dL — ABNORMAL HIGH (ref 70–99)
Potassium: 5 mmol/L (ref 3.5–5.1)
Sodium: 139 mmol/L (ref 135–145)
Total Bilirubin: 1.3 mg/dL — ABNORMAL HIGH (ref 0.3–1.2)
Total Protein: 6.9 g/dL (ref 6.5–8.1)

## 2019-09-04 LAB — DIFFERENTIAL
Abs Immature Granulocytes: 0.04 10*3/uL (ref 0.00–0.07)
Basophils Absolute: 0 10*3/uL (ref 0.0–0.1)
Basophils Relative: 1 %
Eosinophils Absolute: 0.1 10*3/uL (ref 0.0–0.5)
Eosinophils Relative: 1 %
Immature Granulocytes: 1 %
Lymphocytes Relative: 12 %
Lymphs Abs: 1 10*3/uL (ref 0.7–4.0)
Monocytes Absolute: 0.6 10*3/uL (ref 0.1–1.0)
Monocytes Relative: 7 %
Neutro Abs: 6.6 10*3/uL (ref 1.7–7.7)
Neutrophils Relative %: 78 %

## 2019-09-04 LAB — GLUCOSE, CAPILLARY
Glucose-Capillary: 94 mg/dL (ref 70–99)
Glucose-Capillary: 92 mg/dL (ref 70–99)
Glucose-Capillary: 90 mg/dL (ref 70–99)

## 2019-09-04 LAB — I-STAT CHEM 8, ED
BUN: 33 mg/dL — ABNORMAL HIGH (ref 8–23)
Calcium, Ion: 1.15 mmol/L (ref 1.15–1.40)
Chloride: 106 mmol/L (ref 98–111)
Creatinine, Ser: 1.2 mg/dL (ref 0.61–1.24)
Glucose, Bld: 137 mg/dL — ABNORMAL HIGH (ref 70–99)
HCT: 48 % (ref 39.0–52.0)
Hemoglobin: 16.3 g/dL (ref 13.0–17.0)
Potassium: 6.9 mmol/L (ref 3.5–5.1)
Sodium: 136 mmol/L (ref 135–145)
TCO2: 25 mmol/L (ref 22–32)

## 2019-09-04 LAB — CBC
HCT: 47.7 % (ref 39.0–52.0)
Hemoglobin: 15.8 g/dL (ref 13.0–17.0)
MCH: 31 pg (ref 26.0–34.0)
MCHC: 33.1 g/dL (ref 30.0–36.0)
MCV: 93.5 fL (ref 80.0–100.0)
Platelets: 85 10*3/uL — ABNORMAL LOW (ref 150–400)
RBC: 5.1 MIL/uL (ref 4.22–5.81)
RDW: 13.6 % (ref 11.5–15.5)
WBC: 8.4 10*3/uL (ref 4.0–10.5)
nRBC: 0 % (ref 0.0–0.2)

## 2019-09-04 LAB — LIPID PANEL
Cholesterol: 192 mg/dL (ref 0–200)
HDL: 45 mg/dL (ref >40)
LDL Cholesterol: 138 mg/dL — ABNORMAL HIGH (ref 0–99)
Total CHOL/HDL Ratio: 4.3 RATIO
Triglycerides: 46 mg/dL (ref <150)
VLDL: 9 mg/dL (ref 0–40)

## 2019-09-04 LAB — RESPIRATORY PANEL BY RT PCR (FLU A&B, COVID)
Influenza A by PCR: NEGATIVE
Influenza B by PCR: NEGATIVE
SARS Coronavirus 2 by RT PCR: NEGATIVE

## 2019-09-04 LAB — MRSA PCR SCREENING: MRSA by PCR: NEGATIVE

## 2019-09-04 LAB — HEMOGLOBIN A1C
Hgb A1c MFr Bld: 5.9 % — ABNORMAL HIGH (ref 4.8–5.6)
Mean Plasma Glucose: 122.63 mg/dL

## 2019-09-04 LAB — CBG MONITORING, ED: Glucose-Capillary: 134 mg/dL — ABNORMAL HIGH (ref 70–99)

## 2019-09-04 LAB — PROTIME-INR
INR: 1.1 (ref 0.8–1.2)
Prothrombin Time: 13.6 seconds (ref 11.4–15.2)

## 2019-09-04 LAB — APTT: aPTT: 27 seconds (ref 24–36)

## 2019-09-04 MED ORDER — CLEVIDIPINE BUTYRATE 0.5 MG/ML IV EMUL: 0.0000 mg/h | INTRAVENOUS | Status: DC

## 2019-09-04 MED ORDER — SENNOSIDES-DOCUSATE SODIUM 8.6-50 MG PO TABS
1.0000 | ORAL_TABLET | Freq: Two times a day (BID) | ORAL | Status: DC
  Administered 2019-09-06 – 2019-09-09 (×6): 1 via ORAL
  Filled 2019-09-04 (×7): qty 1

## 2019-09-04 MED ORDER — ACETAMINOPHEN 650 MG RE SUPP: 650.0000 mg | RECTAL | Status: DC | PRN

## 2019-09-04 MED ORDER — SODIUM CHLORIDE 0.9 % IV SOLN: INTRAVENOUS | Status: DC

## 2019-09-04 MED ORDER — LABETALOL HCL 5 MG/ML IV SOLN
10.0000 mg | INTRAVENOUS | Status: DC | PRN
  Administered 2019-09-04: 10 mg via INTRAVENOUS

## 2019-09-04 MED ORDER — ACETAMINOPHEN 325 MG PO TABS
650.0000 mg | ORAL_TABLET | ORAL | Status: DC | PRN
  Administered 2019-09-06 – 2019-09-08 (×3): 650 mg via ORAL
  Filled 2019-09-04 (×4): qty 2

## 2019-09-04 MED ORDER — HYDRALAZINE HCL 20 MG/ML IJ SOLN: 10.0000 mg | INTRAMUSCULAR | Status: DC | PRN

## 2019-09-04 MED ORDER — INSULIN ASPART 100 UNIT/ML ~~LOC~~ SOLN
2.0000 [IU] | SUBCUTANEOUS | Status: DC
  Administered 2019-09-06 (×2): 2 [IU] via SUBCUTANEOUS

## 2019-09-04 MED ORDER — IOHEXOL 350 MG/ML SOLN
100.0000 mL | Freq: Once | INTRAVENOUS | Status: AC | PRN
  Administered 2019-09-04: 100 mL via INTRAVENOUS

## 2019-09-04 MED ORDER — PANTOPRAZOLE SODIUM 40 MG IV SOLR
40.0000 mg | Freq: Every day | INTRAVENOUS | Status: DC
  Administered 2019-09-04 – 2019-09-05 (×2): 40 mg via INTRAVENOUS
  Filled 2019-09-04 (×2): qty 40

## 2019-09-04 MED ORDER — ACETAMINOPHEN 160 MG/5ML PO SOLN: 650.0000 mg | ORAL | Status: DC | PRN

## 2019-09-04 MED ORDER — SODIUM CHLORIDE 0.9% FLUSH: 3.0000 mL | Freq: Once | INTRAVENOUS | Status: DC

## 2019-09-04 MED ORDER — STROKE: EARLY STAGES OF RECOVERY BOOK: Freq: Once | Status: DC

## 2019-09-04 NOTE — Progress Notes (Signed)
PT Cancellation Note  Patient Details Name: David [name] MRN: [id number] DOB: 10-13-1923   Cancelled Treatment:    Reason Eval/Treat Not Completed: Active bedrest order.  PT to check back tomorrow.  Thanks,  Verdene Lennert, PT, DPT  Acute Rehabilitation [phone] pager #[phone] office       David [name] 09/04/2019, 3:28 PM

## 2019-09-04 NOTE — ED Provider Notes (Signed)
[place] EMERGENCY DEPARTMENT Provider Note   CSN: [id number] Arrival date & time: 09/04/19  1259     History No chief complaint on file.   David [name] is a 86 y.o. male.  The history is provided by the EMS personnel and the patient. The history is limited by the condition of the patient.  Cerebrovascular Accident This is a new problem. The current episode started 1 to 2 hours ago. The problem occurs constantly. The problem has not changed since onset.Pertinent negatives include no chest pain, no abdominal pain, no headaches and no shortness of breath. Nothing aggravates the symptoms.   LVL 5 caveat for difficulty speaking      Past Medical History:  Diagnosis Date   CAD (coronary artery disease)    intolerance of statins   COPD (chronic obstructive pulmonary disease) (HCC)    Current use of long term anticoagulation    GERD (gastroesophageal reflux disease)    Hyperlipidemia    Hypertension    Impaired glucose tolerance 02/14/2011   LBP (low back pain)    OA (osteoarthritis)    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    Patient Active Problem List   Diagnosis Date Noted   Tremor 05/20/2019   Pulmonary valve vegetation 09/20/2018   Chest pain, atypical 09/20/2018   Onychomycosis 06/06/2018   Acute pain of left knee 08/18/2017   Wart 06/05/2017   Acute upper respiratory infection 05/12/2016   Skin irritation from shaving 12/02/2015   Benign paroxysmal positional vertigo 03/09/2015   Plantar fasciitis, bilateral 12/02/2014   Constipation 07/17/2014   Cough 04/09/2014   S/P AVR (aortic valve replacement) 11/04/2013   Elevated hemoglobin ([place]) 12/14/2012   Well adult exam 11/12/2012   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   Impaired glucose tolerance 02/14/2011   Edema 10/22/2010   Actinic keratoses 10/22/2010   THROMBOCYTOPENIA 01/22/2010   MELANOMA 03/05/2009   TOBACCO USE, QUIT 02/27/2009    Neoplasm of uncertain behavior of skin 05/08/2007   Dyslipidemia 02/08/2007   Essential hypertension 02/08/2007   Coronary atherosclerosis 02/08/2007   COPD 02/08/2007   GERD 02/08/2007   OSTEOARTHRITIS 02/08/2007   LOW BACK PAIN 02/08/2007    Past Surgical History:  Procedure Laterality Date   AORTIC VALVE REPLACEMENT  01/15/1999   bovine   CARDIAC CATHETERIZATION  10/05/1998   Recommend CABG revascularizaition   CARDIAC CATHETERIZATION  06/11/1999   Continue medical therapy   CARDIAC CATHETERIZATION  01/08/2007   Patent grafts, recommend medical therapy   CARDIOVASCULAR STRESS TEST  04/22/2010   Small lateral defect which is partially reversible, no ischemic EKG changes were noted.   CAROTID DOPPLER  04/22/2010   Bilateral ICAs-no evidence of diametere reduction, significant tortuosity, or any other vascular abnormality.   CORONARY ARTERY BYPASS GRAFT  10/09/1998   LIMA to LAD, SVG to diag, SVG sequential to 2 marginals, SVG sequential to PDA & PLA. CE#25 porcine AVR.   TRANSTHORACIC ECHOCARDIOGRAM  05/14/2012   EF 50-55%, bioprosthesis of the aortic valve with well preserved LV function, moderate concentric [place]       Family History  Problem Relation Age of Onset   Coronary artery disease Other    Hypertension Other     Social History   Tobacco Use   Smoking status: Former Smoker    Types: Pipe    Quit date: 05/16/1998    Years since quitting: 21.3   Smokeless tobacco: Former Systems developer  Substance Use Topics  Alcohol use: No   Drug use: No    Home Medications Prior to Admission medications   Medication Sig Start Date End Date Taking? Authorizing Provider  amoxicillin (AMOXIL) 500 MG capsule Take 2,000 mg by mouth See admin instructions. Take 4 capsules (2000 mg) by mouth one hour prior to dental appointments 06/27/18   [provider]  aspirin EC 81 MG tablet Take 81 mg by mouth daily.    [provider]  atenolol (TENORMIN) 25 MG  tablet Take 1 tablet (25 mg total) by mouth daily. 07/29/19   Plotnikov, [name], MD  Cholecalciferol (VITAMIN D3) 75 MCG (3000 UT) TABS Take 3,000 Units by mouth daily.    [provider]  ciclopirox (PENLAC) 8 % solution Apply topically at bedtime. Apply over nail and surrounding skin. Apply daily over previous coat. After seven (7) days, may remove with alcohol and continue cycle. 06/06/18   Plotnikov, [name], MD  diclofenac sodium (VOLTAREN) 1 % GEL Apply 4 g topically 4 (four) times daily. 09/14/18   Deno Etienne, DO  ezetimibe (ZETIA) 10 MG tablet Take 1 tablet (10 mg total) by mouth daily. 06/06/16   Plotnikov, [name], MD  furosemide (LASIX) 20 MG tablet Take 1 tablet (20 mg total) by mouth daily. Patient not taking: Reported on 09/14/2018 11/27/15 06/16/17  Troy Sine, MD  losartan (COZAAR) 25 MG tablet Take 1 tablet (25 mg total) by mouth daily. 07/29/19   Plotnikov, [name], MD  meclizine (ANTIVERT) 12.5 MG tablet 1 po tid prn 07/29/19   Plotnikov, [name], MD    Allergies    Atorvastatin, Colesevelam, Simvastatin, and Sulfa antibiotics  Review of Systems   Review of Systems  Unable to perform ROS: Mental status change  Respiratory: Negative for cough, chest tightness and shortness of breath.   Cardiovascular: Negative for chest pain.  Gastrointestinal: Negative for abdominal pain, constipation, diarrhea, nausea and vomiting.  Musculoskeletal: Negative for back pain and neck pain.  Skin: Negative for wound.  Neurological: Positive for facial asymmetry, speech difficulty and weakness. Negative for headaches.  Psychiatric/Behavioral: Negative for agitation.    Physical Exam Updated Vital Signs There were no vitals taken for this visit.  Physical Exam Vitals and nursing note reviewed.  Constitutional:      General: He is not in acute distress.    Appearance: He is well-developed. He is not ill-appearing, toxic-appearing or diaphoretic.  HENT:     Head:  Normocephalic and atraumatic.     Right Ear: External ear normal.     Left Ear: External ear normal.     Nose: Nose normal. No congestion or rhinorrhea.     Mouth/Throat:     Mouth: Mucous membranes are moist.     Pharynx: No oropharyngeal exudate or posterior oropharyngeal erythema.  Eyes:     Extraocular Movements: Extraocular movements intact.     Conjunctiva/sclera: Conjunctivae normal.     Pupils: Pupils are equal, round, and reactive to light.  Cardiovascular:     Rate and Rhythm: Normal rate.     Pulses: Normal pulses.     Heart sounds: No murmur.  Pulmonary:     Effort: No respiratory distress.     Breath sounds: No stridor.  Abdominal:     General: Abdomen is flat.     Palpations: Abdomen is soft.     Tenderness: There is no abdominal tenderness. There is no guarding or rebound.  Musculoskeletal:     Cervical back: Normal range  of motion and neck supple. No tenderness.  Skin:    General: Skin is warm.     Capillary Refill: Capillary refill takes less than 2 seconds.     Coloration: Skin is not pale.     Findings: No erythema or rash.  Neurological:     Mental Status: He is alert.     Cranial Nerves: Cranial nerve deficit, dysarthria and facial asymmetry present.     Sensory: Sensory deficit present.     Motor: Weakness present. No abnormal muscle tone.     Gait: Abnormal gait: deferred.     ED Results / Procedures / Treatments   Labs (all labs ordered are listed, but only abnormal results are displayed) Labs Reviewed  CBC - Abnormal; Notable for the following components:      Result Value   Platelets 85 (*)    All other components within normal limits  COMPREHENSIVE METABOLIC PANEL - Abnormal; Notable for the following components:   Glucose, Bld 143 (*)    Creatinine, Ser 1.43 (*)    Total Bilirubin 1.3 (*)    GFR calc non Af Amer 42 (*)    GFR calc Af Amer 48 (*)    All other components within normal limits  LIPID PANEL - Abnormal; Notable for the  following components:   LDL Cholesterol 138 (*)    All other components within normal limits  HEMOGLOBIN A1C - Abnormal; Notable for the following components:   Hgb A1c MFr Bld 5.9 (*)    All other components within normal limits  I-STAT CHEM 8, ED - Abnormal; Notable for the following components:   Potassium 6.9 (*)    BUN 33 (*)    Glucose, Bld 137 (*)    All other components within normal limits  CBG MONITORING, ED - Abnormal; Notable for the following components:   Glucose-Capillary 134 (*)    All other components within normal limits  RESPIRATORY PANEL BY RT PCR (FLU A&B, COVID)  MRSA PCR SCREENING  PROTIME-INR  APTT  DIFFERENTIAL  GLUCOSE, CAPILLARY  GLUCOSE, CAPILLARY  COMPREHENSIVE METABOLIC PANEL  PROTIME-INR    EKG EKG Interpretation  Date/Time:  Wednesday September 04 2019 13:29:27 EDT Ventricular Rate:  86 PR Interval:    QRS Duration: 155 QT Interval:  463 QTC Calculation: 554 R Axis:   -29 Text Interpretation: Sinus rhythm Sinus pause Left bundle branch block When comapred to prior, intermittent LBBB. No STEMI Confirmed by Antony Blackbird [phone]) on 09/04/2019 1:43:16 PM   Radiology CT Code Stroke CTA Head W/WO contrast  Result Date: 09/04/2019 CLINICAL DATA:  Code stroke. 86 year old male with aphasia and left gaze, right weakness. Last seen normal 1130 hours. Acute left basal ganglia hemorrhage on plain CT. EXAM: CT ANGIOGRAPHY HEAD AND NECK TECHNIQUE: Multidetector CT imaging of the head and neck was performed using the standard protocol during bolus administration of intravenous contrast. Multiplanar CT image reconstructions and MIPs were obtained to evaluate the vascular anatomy. Carotid stenosis measurements (when applicable) are obtained utilizing NASCET criteria, using the distal internal carotid diameter as the denominator. CONTRAST:  143mL OMNIPAQUE IOHEXOL 350 MG/ML SOLN COMPARISON:  Head CT 1309 hours today. CT cervical spine today. FINDINGS: CTA NECK  Skeleton: Cervical spine detailed separately. No acute osseous abnormality identified. Prior sternotomy. Upper chest: Mild upper lobe lung scarring. Trace retained secretions in the trachea at the thoracic inlet on series 5, image 146. Other neck: Negative. Aortic arch: Prior CABG. Calcified aortic atherosclerosis. 3 vessel arch configuration. Right  carotid system: Tortuous brachiocephalic artery without stenosis despite plaque. Tortuous but otherwise normal right CCA origin. Negative right carotid bifurcation and cervical right ICA. Left carotid system: No left CCA origin stenosis despite plaque. Tortuous proximal left CCA. Minimal calcified plaque at the left carotid bifurcation affecting the ECA origin. Negative cervical left ICA. Vertebral arteries: Tortuous proximal right subclavian artery with a kinked appearance but no stenosis. Normal right vertebral artery origin on series 8, image 144. The right vertebral is intermittently tortuous but otherwise normal to the skull base. Proximal left subclavian artery plaque without stenosis. Tortuous subclavian at the thoracic inlet. Normal left vertebral artery origin. Tortuous left V1 segment. The left vertebral is dominant with tortuosity but no atherosclerosis or stenosis to the skull base. CTA HEAD Posterior circulation: Patent distal vertebral arteries without plaque or stenosis, dominant left V4. Normal PICA origins, although the left AICA appears dominant. Patent vertebrobasilar junction and basilar artery without stenosis. Normal SCA and PCA origins. Posterior communicating arteries are diminutive or absent. Bilateral PCA branches are within normal limits. Anterior circulation: Both ICA siphons are patent with tortuosity but only mild calcified plaque and no stenosis. Normal ophthalmic artery origins. Small infundibulum at both ICA termini, normal variant. Patent and mildly ectatic bilateral ICA terminus, MCA and ACA origins. Anterior communicating artery and  bilateral ACA branches are within normal limits. Left MCA M1 segment bifurcates early without stenosis. Left MCA branches are normal aside from tortuosity. Right MCA M1 segment and bifurcation are patent without stenosis. Right MCA branches are tortuous but otherwise normal. Venous sinuses: The major dural venous sinuses appear to be patent, with better venous contrast timing on the delayed images. There is a small right parasagittal developmental venous anomaly incidentally noted (DVA normal variant series 8, image 216). Anatomic variants: Dominant left vertebral artery. Other findings: The left lentiform acute hemorrhage is negative for CTA spot sign. No abnormal vascularity is identified in that region. Delayed postcontrast images are also obtained, with no abnormal enhancement identified. Review of the MIP images confirms the above findings IMPRESSION: 1. The left basal ganglia hemorrhage is negative for CTA spot sign, with no evidence of vascular malformation, and no abnormal enhancement. 2. CTA is negative for large vessel occlusion, with minimal for age atherosclerosis in the head and neck. There is generalized arterial tortuosity. 3. Prior CABG.  Aortic Atherosclerosis (ICD10-I70.0). Electronically Signed   By: Genevie Ann M.D.   On: 09/04/2019 13:37   CT Code Stroke CTA Neck W/WO contrast  Result Date: 09/04/2019 CLINICAL DATA:  Code stroke. 86 year old male with aphasia and left gaze, right weakness. Last seen normal 1130 hours. Acute left basal ganglia hemorrhage on plain CT. EXAM: CT ANGIOGRAPHY HEAD AND NECK TECHNIQUE: Multidetector CT imaging of the head and neck was performed using the standard protocol during bolus administration of intravenous contrast. Multiplanar CT image reconstructions and MIPs were obtained to evaluate the vascular anatomy. Carotid stenosis measurements (when applicable) are obtained utilizing NASCET criteria, using the distal internal carotid diameter as the denominator.  CONTRAST:  130mL OMNIPAQUE IOHEXOL 350 MG/ML SOLN COMPARISON:  Head CT 1309 hours today. CT cervical spine today. FINDINGS: CTA NECK Skeleton: Cervical spine detailed separately. No acute osseous abnormality identified. Prior sternotomy. Upper chest: Mild upper lobe lung scarring. Trace retained secretions in the trachea at the thoracic inlet on series 5, image 146. Other neck: Negative. Aortic arch: Prior CABG. Calcified aortic atherosclerosis. 3 vessel arch configuration. Right carotid system: Tortuous brachiocephalic artery without stenosis despite plaque. Tortuous but  otherwise normal right CCA origin. Negative right carotid bifurcation and cervical right ICA. Left carotid system: No left CCA origin stenosis despite plaque. Tortuous proximal left CCA. Minimal calcified plaque at the left carotid bifurcation affecting the ECA origin. Negative cervical left ICA. Vertebral arteries: Tortuous proximal right subclavian artery with a kinked appearance but no stenosis. Normal right vertebral artery origin on series 8, image 144. The right vertebral is intermittently tortuous but otherwise normal to the skull base. Proximal left subclavian artery plaque without stenosis. Tortuous subclavian at the thoracic inlet. Normal left vertebral artery origin. Tortuous left V1 segment. The left vertebral is dominant with tortuosity but no atherosclerosis or stenosis to the skull base. CTA HEAD Posterior circulation: Patent distal vertebral arteries without plaque or stenosis, dominant left V4. Normal PICA origins, although the left AICA appears dominant. Patent vertebrobasilar junction and basilar artery without stenosis. Normal SCA and PCA origins. Posterior communicating arteries are diminutive or absent. Bilateral PCA branches are within normal limits. Anterior circulation: Both ICA siphons are patent with tortuosity but only mild calcified plaque and no stenosis. Normal ophthalmic artery origins. Small infundibulum at both ICA  termini, normal variant. Patent and mildly ectatic bilateral ICA terminus, MCA and ACA origins. Anterior communicating artery and bilateral ACA branches are within normal limits. Left MCA M1 segment bifurcates early without stenosis. Left MCA branches are normal aside from tortuosity. Right MCA M1 segment and bifurcation are patent without stenosis. Right MCA branches are tortuous but otherwise normal. Venous sinuses: The major dural venous sinuses appear to be patent, with better venous contrast timing on the delayed images. There is a small right parasagittal developmental venous anomaly incidentally noted (DVA normal variant series 8, image 216). Anatomic variants: Dominant left vertebral artery. Other findings: The left lentiform acute hemorrhage is negative for CTA spot sign. No abnormal vascularity is identified in that region. Delayed postcontrast images are also obtained, with no abnormal enhancement identified. Review of the MIP images confirms the above findings IMPRESSION: 1. The left basal ganglia hemorrhage is negative for CTA spot sign, with no evidence of vascular malformation, and no abnormal enhancement. 2. CTA is negative for large vessel occlusion, with minimal for age atherosclerosis in the head and neck. There is generalized arterial tortuosity. 3. Prior CABG.  Aortic Atherosclerosis (ICD10-I70.0). Electronically Signed   By: Genevie Ann M.D.   On: 09/04/2019 13:37   CT CERVICAL SPINE WO CONTRAST  Result Date: 09/04/2019 CLINICAL DATA:  Code stroke. 86 year old male with aphasia and left gaze, right weakness. Last seen normal 1130 hours. EXAM: CT CERVICAL SPINE WITHOUT CONTRAST TECHNIQUE: Multidetector CT imaging of the cervical spine was performed without intravenous contrast. Multiplanar CT image reconstructions were also generated. COMPARISON:  Head CT today reported separately. Thoracic spine CT 09/14/2018. FINDINGS: Alignment: Preserved cervical lordosis. Cervicothoracic junction alignment  is within normal limits. Bilateral posterior element alignment is within normal limits. Skull base and vertebrae: Visualized skull base is intact. No atlanto-occipital dissociation. No acute osseous abnormality identified. Soft tissues and spinal canal: No prevertebral fluid or swelling. No visible canal hematoma. Negative noncontrast neck soft tissues. Disc levels: Widespread cervical spine degeneration, but mild if any cervical spinal stenosis, at C5-C6 and C6-C7. Upper chest: Visible upper thoracic levels appears stable and intact. Negative visible lung apices. IMPRESSION: 1.  No acute traumatic injury identified in the cervical spine. 2. Spine degeneration but mild if any cervical spinal stenosis. Electronically Signed   By: Genevie Ann M.D.   On: 09/04/2019 13:26  CT HEAD CODE STROKE WO CONTRAST  Result Date: 09/04/2019 CLINICAL DATA:  Code stroke. 86 year old male with aphasia and left gaze, right weakness. Last seen normal 1130 hours. EXAM: CT HEAD WITHOUT CONTRAST TECHNIQUE: Contiguous axial images were obtained from the base of the skull through the vertex without intravenous contrast. COMPARISON:  Brain MRI 08/30/2003. FINDINGS: Brain: Oval, lentiform hyperdense hemorrhage epicenter in they left lentiform nuclei, with blood products encompassing 38 x 24 x 35 mm (AP by transverse by CC) for an estimated intra-axial blood volume of 16 mL. No intraventricular extension. No extra-axial extension identified. Mild regional edema, minimal mass effect. No midline shift. Normal basilar cisterns. Normal ventricle size and configuration. Patchy bilateral white matter hypodensity including chronic appearing infarct of the right external capsule. No cortically based acute infarct identified. No other intracranial blood identified. Vascular: Mild Calcified atherosclerosis at the skull base. No suspicious intracranial vascular hyperdensity. Skull: Intact. Sinuses/Orbits: Visualized paranasal sinuses and mastoids are  stable and well pneumatized. Other: No acute orbit or scalp soft tissue finding. ASPECTS [facility] Stroke Program Early CT Score) Total score (0-10 with 10 being normal): Acute hemorrhage. IMPRESSION: 1. Acute intra-axial hemorrhage in the left lentiform nuclei. Estimated blood volume 16 mL. No intraventricular or extra-axial extension. No significant intracranial mass effect. 2. Evidence of underlying chronic small vessel disease. 3. Critical Value/emergent results were called by telephone at the time of interpretation on 09/04/2019 at 1:21 pm to Dr. Rory Percy, Neurology who verbally acknowledged these results. Electronically Signed   By: Genevie Ann M.D.   On: 09/04/2019 13:21    Procedures Procedures (including critical care time)  CRITICAL CARE Performed by: Gwenyth Allegra Verdon Ferrante Total critical care time: 35 minutes Critical care time was exclusive of separately billable procedures and treating other patients. Critical care was necessary to treat or prevent imminent or life-threatening deterioration. Critical care was time spent personally by me on the following activities: development of treatment plan with patient and/or surrogate as well as nursing, discussions with consultants, evaluation of patient's response to treatment, examination of patient, obtaining history from patient or surrogate, ordering and performing treatments and interventions, ordering and review of laboratory studies, ordering and review of radiographic studies, pulse oximetry and re-evaluation of patient's condition.   Medications Ordered in ED Medications  sodium chloride flush (NS) 0.9 % injection 3 mL (has no administration in time range)   stroke: mapping our early stages of recovery book (has no administration in time range)  acetaminophen (TYLENOL) tablet 650 mg (has no administration in time range)    Or  acetaminophen (TYLENOL) 160 MG/5ML solution 650 mg (has no administration in time range)    Or  acetaminophen  (TYLENOL) suppository 650 mg (has no administration in time range)  senna-docusate (Senokot-S) tablet 1 tablet (1 tablet Oral Not Given 09/04/19 1541)  pantoprazole (PROTONIX) injection 40 mg (has no administration in time range)  0.9 %  sodium chloride infusion (has no administration in time range)  clevidipine (CLEVIPREX) infusion 0.5 mg/mL (has no administration in time range)  insulin aspart (novoLOG) injection 2-6 Units (2 Units Subcutaneous Not Given 09/04/19 1620)  iohexol (OMNIPAQUE) 350 MG/ML injection 100 mL (100 mLs Intravenous Contrast Given 09/04/19 1318)    ED Course  I have reviewed the triage vital signs and the nursing notes.  Pertinent labs & imaging results that were available during my care of the patient were reviewed by me and considered in my medical decision making (see chart for details).    MDM Rules/Calculators/A&P  DERRIKE ASHCRAFT is a 86 y.o. male with a past medical history significant for hypertension, COPD, CAD status post CABG, aortic valve replacement, BPPV, hyperlipidemia, and osteoporosis who presents as a code stroke.  According to EMS, patient's last seen normal at 11:30 AM, and then when wife returned from [place] door, found patient on the ground with right-sided weakness and difficulty speaking.  Patient will be brought in for evaluation.  Airway was intact on arrival he was taken to [place].  On my exam, patient is having weakness in the right hemibody.  He is having some difficulty speaking.  Pupils are symmetric and reactive.  Chest and abdomen nontender.  C-collar is in place and we will get a CT C-spine as well.  CT of the head revealed an acute intracerebral hemorrhage.  This is likely causing his symptoms.  Patient will be started on blood pressure management and will be admitted for further management by neurology.  Will await imaging results to look for traumatic injury to the neck or head.  Anticipate admission to  neurology after work-up is completed.  Patient's EKG did show some intermittent left bundle branch block but there is no evidence of STEMI.  Initial potassium was elevated on the i-STAT but then the repeat was normal.  Covid test negative.  Patient admitted to ICU with neurology for further management of intracranial hemorrhage and code stroke.   Final Clinical Impression(s) / ED Diagnoses Final diagnoses:  Nontraumatic intracerebral hemorrhage, unspecified cerebral location, unspecified laterality (HCC)   Clinical Impression: 1. Nontraumatic intracerebral hemorrhage, unspecified cerebral location, unspecified laterality [facility])     Disposition: Admit  This note was prepared with assistance of Dragon voice recognition software. Occasional wrong-word or sound-a-like substitutions may have occurred due to the inherent limitations of voice recognition software.        [place]kle [place], Gwenyth Allegra, MD 09/04/19 2105

## 2019-09-04 NOTE — ED Triage Notes (Signed)
Pt arrives via EMS from home as LSN 1045. Pt was found on the ground with left gaze and right sided weakness. LVO positive.

## 2019-09-04 NOTE — Code Documentation (Signed)
86 yo male coming from home where he lives with his wife. Wife left patient at 28 (LKW) when she went to the store. When she returned, pt was found on the ground with right sided weakness and left gaze. EMS called and activated a Code Stroke.   Stroke Team met patient upon arrival to the ED. NIHSS 20 due to right sided arm and leg weakness, right facial droop, left gaze preference, right field cut, aphasia, and dysarthria. Blood drawn, EDP cleared airway, and pt taken to CT.   CT Head showed "Acute intra-axial hemorrhage in the left lentiform nuclei. Estimated blood volume 16 mL. No intraventricular or extra-axial extension. No significant intracranial mass effect." Pt's BP noted to be 135/77. No need for BP management. CTA completed to r/o vascular malformation.   Returned to the ED. Placed on the cardiac monitor. Handoff given to Burman Nieves, Therapist, sports. Plan - Q1h VS/mNIHSS w/ pupils, BP < 140, admit to ICU.

## 2019-09-04 NOTE — H&P (Addendum)
Neurology Consultation   CC: Dysarthria, aphasia, right-sided plegia  History is obtained from: Family and EMS  HPI: David [name] is a 86 y.o. male with past medical history of status post AVR, impaired glucose tolerance, hypertension, hyperlipidemia, COPD and CAD.  Patient awoke within normal limits this morning and took a shower.  Per daughter he sat back down at the edge of the bed and she left for Dr. Pearletha Alfred.  About 15 minutes later when she came back she found patient on the floor with right-sided weakness/paralysis, right-sided facial droop, left gaze preference.  EMS was called and patient was brought to the hospital as a code stroke.  Patient's blood pressure was immediately controlled and patient was admitted to the ICU..   LKW: 1045 this morning tpa given?: no, ICH Premorbid modified Rankin scale (mRS): 0 ICH Score: 1 NIH stroke score 20  Past Medical History:  Diagnosis Date  . CAD (coronary artery disease)    intolerance of statins  . COPD (chronic obstructive pulmonary disease) ([place])   . Current use of long term anticoagulation   . GERD (gastroesophageal reflux disease)   . Hyperlipidemia   . Hypertension   . Impaired glucose tolerance 02/14/2011  . LBP (low back pain)   . OA (osteoarthritis)   . 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     Family History  Problem Relation Age of Onset  . Coronary artery disease Other   . Hypertension Other    Social History:   reports that he quit smoking about 21 years ago. His smoking use included pipe. He has quit using smokeless tobacco. He reports that he does not drink alcohol or use drugs.  Medications  Current Facility-Administered Medications:  .   stroke: mapping our early stages of recovery book, , Does not apply, Once, Marliss Coots, PA-C .  0.9 %  sodium chloride infusion, , Intravenous, Continuous, Marliss Coots, PA-C .  acetaminophen (TYLENOL) tablet 650 mg, 650 mg, Oral, Q4H PRN **OR**  acetaminophen (TYLENOL) 160 MG/5ML solution 650 mg, 650 mg, Per Tube, Q4H PRN **OR** acetaminophen (TYLENOL) suppository 650 mg, 650 mg, Rectal, Q4H PRN, Marliss Coots, PA-C .  clevidipine (CLEVIPREX) infusion 0.5 mg/mL, 0-21 mg/hr, Intravenous, Continuous, Marliss Coots, PA-C .  pantoprazole (PROTONIX) injection 40 mg, 40 mg, Intravenous, QHS, Marliss Coots, PA-C .  senna-docusate (Senokot-S) tablet 1 tablet, 1 tablet, Oral, BID, Marliss Coots, PA-C .  sodium chloride flush (NS) 0.9 % injection 3 mL, 3 mL, Intravenous, Once, Tegeler, Gwenyth Allegra, MD  Current Outpatient Medications:  .  amoxicillin (AMOXIL) 500 MG capsule, Take 2,000 mg by mouth See admin instructions. Take 4 capsules (2000 mg) by mouth one hour prior to dental appointments, Disp: , Rfl:  .  aspirin EC 81 MG tablet, Take 81 mg by mouth daily., Disp: , Rfl:  .  atenolol (TENORMIN) 25 MG tablet, Take 1 tablet (25 mg total) by mouth daily., Disp: 90 tablet, Rfl: 3 .  Cholecalciferol (VITAMIN D3) 75 MCG (3000 UT) TABS, Take 3,000 Units by mouth daily., Disp: , Rfl:  .  ciclopirox (PENLAC) 8 % solution, Apply topically at bedtime. Apply over nail and surrounding skin. Apply daily over previous coat. After seven (7) days, may remove with alcohol and continue cycle., Disp: 6.6 mL, Rfl: 0 .  diclofenac sodium (VOLTAREN) 1 % GEL, Apply 4 g topically 4 (four) times daily., Disp: 100 g, Rfl: 0 .  ezetimibe (ZETIA) 10  MG tablet, Take 1 tablet (10 mg total) by mouth daily., Disp: 90 tablet, Rfl: 3 .  furosemide (LASIX) 20 MG tablet, Take 1 tablet (20 mg total) by mouth daily. (Patient not taking: Reported on 09/14/2018), Disp: 30 tablet, Rfl: 11 .  losartan (COZAAR) 25 MG tablet, Take 1 tablet (25 mg total) by mouth daily., Disp: 90 tablet, Rfl: 3 .  meclizine (ANTIVERT) 12.5 MG tablet, 1 po tid prn, Disp: 60 tablet, Rfl: 1  ROS:  Unable to obtain due to altered mental status.    Exam: Current vital signs: BP 134/78   Pulse (!) 41    Temp 97.7 F (36.5 C) (Oral)   Resp 11   Ht 5\' 10"  (1.778 m)   Wt 97.7 kg   SpO2 97%   BMI 30.91 kg/m  Vital signs in last 24 hours: Temp:  [97.7 F (36.5 C)] 97.7 F (36.5 C) (04/21 1334) Pulse Rate:  [41-84] 41 (04/21 1415) Resp:  [11-26] 11 (04/21 1415) BP: (105-134)/(61-78) 134/78 (04/21 1415) SpO2:  [97 %-98 %] 97 % (04/21 1415) Weight:  [97.7 kg] 97.7 kg (04/21 1341)   Constitutional: Appears well-developed and well-nourished.  Eyes: No scleral injection HENT: No OP obstrucion Head: Normocephalic.  Cardiovascular: Palpable Respiratory: Effort normal, non-labored breathing GI: Soft.  No distension. There is no tenderness.  Skin: WDI  Neuro: Mental Status: Patient is awake, alert, shows both expressive and receptive aphasia.  Also shows dysarthria. Cranial Nerves: II: Blinks to threat on the right however has inconsistent nonblinking to threat on the left. III,IV, VI: EOMI without ptosis or diploplia.  Left gaze preference however does cross midline with doll's eyes.  Pupils equal, round and reactive to light V: Facial sensation is symmetric to temperature VII: Right facial droop VIII: hearing is intact to voice X: Palat elevates symmetrically XI: Shoulder shrug is symmetric. XII: tongue is midline without atrophy or fasciculations.  Motor: Hemiplegic in right arm and leg.  Able to lift antigravity left arm and leg. Sensory: Sensation is symmetric to light touch and temperature in the arms and legs. Deep Tendon Reflexes: 2+ and symmetric in the biceps and patellae. Plantars: Upgoing on the right downgoing on the left Cerebellar: Unable to obtain   Labs I have reviewed labs in epic and the results pertinent to this consultation are:   CBC    Component Value Date/Time   WBC 8.4 09/04/2019 1300   RBC 5.10 09/04/2019 1300   HGB 16.3 09/04/2019 1305   HCT 48.0 09/04/2019 1305   PLT 85 (L) 09/04/2019 1300   MCV 93.5 09/04/2019 1300   MCH 31.0 09/04/2019  1300   MCHC 33.1 09/04/2019 1300   RDW 13.6 09/04/2019 1300   LYMPHSABS 1.0 09/04/2019 1300   MONOABS 0.6 09/04/2019 1300   EOSABS 0.1 09/04/2019 1300   BASOSABS 0.0 09/04/2019 1300    CMP     Component Value Date/Time   NA 136 09/04/2019 1305   K 6.9 (HH) 09/04/2019 1305   CL 106 09/04/2019 1305   CO2 23 09/04/2019 1300   GLUCOSE 137 (H) 09/04/2019 1305   BUN 33 (H) 09/04/2019 1305   CREATININE 1.20 09/04/2019 1305   CALCIUM 9.0 09/04/2019 1300   PROT 6.9 09/04/2019 1300   ALBUMIN 3.8 09/04/2019 1300   AST 35 09/04/2019 1300   ALT 19 09/04/2019 1300   ALKPHOS 46 09/04/2019 1300   BILITOT 1.3 (H) 09/04/2019 1300   GFRNONAA 42 (L) 09/04/2019 1300   GFRAA 48 (L) 09/04/2019  1300    Lipid Panel     Component Value Date/Time   CHOL 183 05/20/2019 1010   TRIG 76.0 05/20/2019 1010   HDL 45.30 05/20/2019 1010   CHOLHDL 4 05/20/2019 1010   VLDL 15.2 05/20/2019 1010   LDLCALC 122 (H) 05/20/2019 1010   LDLDIRECT 136.2 08/01/2007 0856     Imaging I have reviewed the images obtained:  CT-scan of the brain-Acute intra-axial hemorrhage in the left lentiform nuclei.Estimated blood volume 16 mL. No intraventricular or extra-axial extension. No significant intracranial mass effect  MRI to be obtained  Etta Quill PA-C [facility] [phone]  M-F  (9:00 am- 5:00 PM)  09/04/2019, 2:35 PM     Assessment:  This is an unfortunate 86 year old male presenting to [facility] after suffering from a acute intra-axial hemorrhage in the left lentiform nuclei.  Exam shows aphasia, dysarthria, right-sided plegia.  Patient will be admitted to 4 N. ICU, blood pressure to be controlled by Cleviprex, frequent neuro checks and PT to evaluate.  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  May treat with O2 per nasal cannula  CV Status post AVR Essential (primary) hypertension -Aggressive BP control, goal SBP <140 -Cleviprex to titrate systolic blood pressure less than 140 -Echocardiogram  Intermittent left bundle branch block on EKG -Will check with PCCM if any acute intervention or cardiology consult as needed.  GI/GU -No active issues  HEME -Thrombocytopenia we will need to monitor closely.  If less than 50 will transfuse with platelets -Monitor -transfuse for hgb < 7 -Hemoglobin A1c  ENDO -Impaired glucose tolerance -goal HgbA1c < 7 -Sliding scale insulin  Fluid/Electrolyte Disorders -Normal saline 75 cc/h -Most recent potassium 6.9 however this might be hemolyzed repeating CMP  Renal -42  ID -No active problems   Nutrition Heart healthy diet once cleared by speech therapy to have food  Prophylaxis DVT: SCDs GI: Protonix Bowel: Senokot  Dispo: Physical therapy to evaluate for outpatient skilled nursing facility rehab versus inpatient rehab  Diet: NPO until cleared by speech  Code Status: DNR   Attending addendum Patient seen and examined as a code stroke for aphasia, dysarthria, right-sided hemiparesis. Last seen normal at 10:45 AM this morning when family left him at home to go for an appointment. Upon the return, he was found down with a bruise on his right head and inability to talk and inability to move the right side of the body. EMS called, assessed the patient, activated LVO positive acute code stroke. Seen and examined on the ER bridge. On examination, awake alert, left gaze preference, mumbling some words but incoherent, severe dysarthria, inability to follow commands,  inconsistent blinking from either side, right lower facial weakness, right upper extremity plegic, right lower extremity paretic, diminished sensation on the right in comparison to the left, intact left side.  NIH stroke scale of 20. CT head with the acute bleed measuring around 16 cc in the left lentiform nucleus. CTA with no LVO.  No spot sign  Assessment and plan: -ICH-likely hypertensive Admit to neuro ICU Systolic blood pressure goal less than 140-use labetalol as needed and Cleviprex Platelet  count is 85,000-that is near his baseline.  Will consider transfusion if it is less than 50,000 but for now we will repeat labs in the morning. Repeat CT head in 6 hours PT OT speech therapy N.p.o. until cleared by bedside swallow evaluation or formal swallow evaluation Normal saline at 75 cc an hour Check A1c-goal less than 7 Cardiovascular: Status post AVR. Intermittent left bundle branch block on EKG.  Will check with PCCM if any acute intervention needs to be done. Rest of the recommendations as above-agree with the recommendations that I helped formulate. Imaging independently reviewed. CODE STATUS: DNR  Present on admission ICH Cerebral edema DNR Thrombocytopenia Hypertensive emergency:   -- Amie Portland, MD [facility] Pager: [phone] If 7pm to 7am, please call on call as listed on AMION.  CRITICAL CARE ATTESTATION Performed by: Amie Portland, MD Total critical care time: 55 minutes Critical care time was exclusive of separately billable procedures and treating other patients and/or supervising APPs/Residents/Students Critical care was necessary to treat or prevent imminent or life-threatening deterioration due to [place], thrombocytopenia, hypertensive emergency This patient is critically ill and at significant risk for neurological worsening and/or death and care requires constant monitoring. Critical care was time spent personally by me on the following  activities: development of treatment plan with patient and/or surrogate as well as nursing, discussions with consultants, evaluation of patient's response to treatment, examination of patient, obtaining history from patient or surrogate, ordering and performing treatments and interventions, ordering and review of laboratory studies, ordering and review of radiographic studies, pulse oximetry, re-evaluation of patient's condition, participation in multidisciplinary rounds and medical decision making of high complexity in the care of this patient.

## 2019-09-05 ENCOUNTER — Inpatient Hospital Stay (HOSPITAL_COMMUNITY): Payer: Medicare Other

## 2019-09-05 DIAGNOSIS — I6389 Other cerebral infarction: Secondary | ICD-10-CM

## 2019-09-05 LAB — CBC
HCT: 44.5 % (ref 39.0–52.0)
Hemoglobin: 14.9 g/dL (ref 13.0–17.0)
MCH: 30.5 pg (ref 26.0–34.0)
MCHC: 33.5 g/dL (ref 30.0–36.0)
MCV: 91 fL (ref 80.0–100.0)
Platelets: 89 10*3/uL — ABNORMAL LOW (ref 150–400)
RBC: 4.89 MIL/uL (ref 4.22–5.81)
RDW: 13.6 % (ref 11.5–15.5)
WBC: 6.7 10*3/uL (ref 4.0–10.5)
nRBC: 0 % (ref 0.0–0.2)

## 2019-09-05 LAB — GLUCOSE, CAPILLARY
Glucose-Capillary: 90 mg/dL (ref 70–99)
Glucose-Capillary: 84 mg/dL (ref 70–99)
Glucose-Capillary: 87 mg/dL (ref 70–99)
Glucose-Capillary: 85 mg/dL (ref 70–99)
Glucose-Capillary: 88 mg/dL (ref 70–99)

## 2019-09-05 LAB — COMPREHENSIVE METABOLIC PANEL
ALT: 15 U/L (ref 0–44)
AST: 21 U/L (ref 15–41)
Albumin: 3.3 g/dL — ABNORMAL LOW (ref 3.5–5.0)
Alkaline Phosphatase: 43 U/L (ref 38–126)
Anion gap: 10 (ref 5–15)
BUN: 18 mg/dL (ref 8–23)
CO2: 22 mmol/L (ref 22–32)
Calcium: 8.8 mg/dL — ABNORMAL LOW (ref 8.9–10.3)
Chloride: 107 mmol/L (ref 98–111)
Creatinine, Ser: 1.34 mg/dL — ABNORMAL HIGH (ref 0.61–1.24)
GFR calc Af Amer: 52 mL/min — ABNORMAL LOW (ref >60)
GFR calc non Af Amer: 45 mL/min — ABNORMAL LOW (ref >60)
Glucose, Bld: 92 mg/dL (ref 70–99)
Potassium: 4.1 mmol/L (ref 3.5–5.1)
Sodium: 139 mmol/L (ref 135–145)
Total Bilirubin: 1.4 mg/dL — ABNORMAL HIGH (ref 0.3–1.2)
Total Protein: 6.5 g/dL (ref 6.5–8.1)

## 2019-09-05 LAB — ECHOCARDIOGRAM COMPLETE
Height: 71 in
Weight: 3442.7 oz

## 2019-09-05 MED ORDER — PERFLUTREN LIPID MICROSPHERE
1.0000 mL | INTRAVENOUS | Status: AC | PRN
  Administered 2019-09-05: 3 mL via INTRAVENOUS
  Filled 2019-09-05: qty 10

## 2019-09-05 MED ORDER — LABETALOL HCL 5 MG/ML IV SOLN: 10.0000 mg | INTRAVENOUS | Status: DC | PRN

## 2019-09-05 MED ORDER — CHLORHEXIDINE GLUCONATE 0.12 % MT SOLN
15.0000 mL | Freq: Two times a day (BID) | OROMUCOSAL | Status: DC
  Administered 2019-09-05 – 2019-09-09 (×9): 15 mL via OROMUCOSAL
  Filled 2019-09-05 (×8): qty 15

## 2019-09-05 MED ORDER — CHLORHEXIDINE GLUCONATE CLOTH 2 % EX PADS
6.0000 | MEDICATED_PAD | Freq: Every day | CUTANEOUS | Status: DC
  Administered 2019-09-05 – 2019-09-06 (×2): 6 via TOPICAL

## 2019-09-05 MED ORDER — HYDRALAZINE HCL 20 MG/ML IJ SOLN: 5.0000 mg | INTRAMUSCULAR | Status: DC | PRN

## 2019-09-05 MED ORDER — ORAL CARE MOUTH RINSE
15.0000 mL | Freq: Two times a day (BID) | OROMUCOSAL | Status: DC
  Administered 2019-09-05 – 2019-09-09 (×7): 15 mL via OROMUCOSAL

## 2019-09-05 NOTE — Progress Notes (Signed)
Pt transferred to 3W-37 via bed. Report called PTA. Receiving RN at bedside. Belongings taken home previously by family. NS infusing at 75. PIV intact. Care transferred.

## 2019-09-05 NOTE — Evaluation (Addendum)
Physical Therapy Evaluation Patient Details Name: KAYDEN GUSTAFSON MRN: [id number] DOB: 1923-11-26 Today's Date: 09/05/2019   History of Present Illness  86 y.o. male with past medical history of status post AVR, impaired glucose tolerance, hypertension, hyperlipidemia, COPD and CAD. Pt found with R weakness, R facial droop and left gaze preference. Pt found to have subcortical nontraumatic ICH (L basal ganglia) on CT scan.  Clinical Impression  Pt presents to PT with deficits in functional mobility, gait, balance, endurance, strength, power, motor control, visual-spatial awareness. Pt with garbled speech for majority of session but able to speak clearly for a few words at a time intermittently. Pt with R inattention during session, requires significant verbal cueing to turn head toward right to scan. Pt does attend to RUE during session, less so observed with RLE. Pt with increased extensor tone in RLE at rest and during mobility. Pt currently requiring significant physical assistance to mobilize at this time and at a high risk for falls due to preference to lean toward R side. Pt will benefit from high intensity inpatient PT services to improve mobility quality and reduce falls risk.    Follow Up Recommendations CIR;Supervision/Assistance - 24 hour    Equipment Recommendations  Wheelchair (measurements PT);Wheelchair cushion (measurements PT);Hospital bed;Other (comment)(mechanical lift, if discharging today)    Recommendations for Other Services       Precautions / Restrictions Precautions Precautions: Fall Precaution Comments: pushing to R Restrictions Weight Bearing Restrictions: No      Mobility  Bed Mobility Overal bed mobility: Needs Assistance Bed Mobility: Supine to Sit     Supine to sit: Mod assist;+2 for physical assistance        Transfers Overall transfer level: Needs assistance Equipment used: 2 person hand held assist Transfers: Sit to/from [employer] Sit to Stand: Mod assist;+2 physical assistance;From elevated surface   Squat pivot transfers: Mod assist;+2 physical assistance        Ambulation/Gait                Stairs            Wheelchair Mobility    Modified Rankin (Stroke Patients Only) Modified Rankin (Stroke Patients Only) Pre-Morbid Rankin Score: No symptoms Modified Rankin: Moderately severe disability     Balance Overall balance assessment: Needs assistance Sitting-balance support: Bilateral upper extremity supported;Feet supported Sitting balance-Leahy Scale: Poor Sitting balance - Comments: mod-maxA sitting edge of bed Postural control: Right lateral lean;Posterior lean Standing balance support: Bilateral upper extremity supported Standing balance-Leahy Scale: Poor Standing balance comment: modA of 2 persons with bilateral knee block, strong R lean                             Pertinent Vitals/Pain Pain Assessment: Faces Faces Pain Scale: Hurts little more Pain Location: R wrist Pain Descriptors / Indicators: Guarding Pain Intervention(s): Monitored during session    Home Living Family/patient expects to be discharged to:: Private residence Living Arrangements: Children Available Help at Discharge: Family;Available 24 hours/day Type of Home: House Home Access: Ramped entrance     Home Layout: One level Home Equipment: Walker - 2 wheels;Cane - quad;Wheelchair - power;Shower seat Additional Comments: daughter providing history as pt with impaired speech    Prior Function Level of Independence: Independent         Comments: pt driving, no use of assistive devices, independent with all ADLs     Hand Dominance   Dominant Hand:  Right    Extremity/Trunk Assessment   Upper Extremity Assessment Upper Extremity Assessment: Defer to OT evaluation    Lower Extremity Assessment Lower Extremity Assessment: RLE deficits/detail;LLE deficits/detail RLE Deficits /  Details: no AROM noted, pt with significant extensor tone at knee and hip LLE Deficits / Details: LLE WFL    Cervical / Trunk Assessment Cervical / Trunk Assessment: Other exceptions Cervical / Trunk Exceptions: pt with L gaze preference and some R inattention. Pt positioned to promote cervical rotation to right when talking with family  Communication   Communication: Expressive difficulties(difficult to assess receptive 2/2 impaired expressive)  Cognition Arousal/Alertness: Awake/alert Behavior During Therapy: WFL for tasks assessed/performed Overall Cognitive Status: Difficult to assess                                        General Comments General comments (skin integrity, edema, etc.): VSS on room air during session    Exercises     Assessment/Plan    PT Assessment Patient needs continued PT services  PT Problem List Decreased strength;Decreased range of motion;Decreased activity tolerance;Decreased balance;Decreased mobility;Decreased coordination;Decreased cognition;Decreased knowledge of use of DME;Decreased safety awareness;Decreased knowledge of precautions;Impaired tone;Pain       PT Treatment Interventions DME instruction;Gait training;Functional mobility training;Therapeutic activities;Therapeutic exercise;Balance training;Neuromuscular re-education;Patient/family education;Cognitive remediation    PT Goals (Current goals can be found in the Care Plan section)  Acute Rehab PT Goals Patient Stated Goal: To improve mobility, attempt to return to baseline PT Goal Formulation: With patient/family Time For Goal Achievement: 09/19/19 Potential to Achieve Goals: Good    Frequency Min 4X/week   Barriers to discharge        Co-evaluation PT/OT/SLP Co-Evaluation/Treatment: Yes Reason for Co-Treatment: Complexity of the patient's impairments (multi-system involvement);Necessary to address cognition/behavior during functional activity;For  patient/therapist safety;To address functional/ADL transfers PT goals addressed during session: Mobility/safety with mobility;Balance;Strengthening/ROM         AM-PAC PT "6 Clicks" Mobility  Outcome Measure Help needed turning from your back to your side while in a flat bed without using bedrails?: A Lot Help needed moving from lying on your back to sitting on the side of a flat bed without using bedrails?: A Lot Help needed moving to and from a bed to a chair (including a wheelchair)?: A Lot Help needed standing up from a chair using your arms (e.g., wheelchair or bedside chair)?: A Lot Help needed to walk in hospital room?: Total Help needed climbing 3-5 steps with a railing? : Total 6 Click Score: 10    End of Session   Activity Tolerance: Patient tolerated treatment well Patient left: in chair;with call bell/phone within reach;with chair alarm set;with family/visitor present Nurse Communication: Mobility status;Need for lift equipment PT Visit Diagnosis: Other abnormalities of gait and mobility (R26.89);Muscle weakness (generalized) (M62.81);Other symptoms and signs involving the nervous system (R29.898);Hemiplegia and hemiparesis Hemiplegia - Right/Left: Right Hemiplegia - dominant/non-dominant: Dominant Hemiplegia - caused by: Nontraumatic intracerebral hemorrhage    Time: [id number] PT Time Calculation (min) (ACUTE ONLY): 25 min   Charges:   PT Evaluation $PT Eval Moderate Complexity: 1 Mod          Zenaida Niece, PT, DPT Acute Rehabilitation Pager: [phone]   Zenaida Niece 09/05/2019, 2:30 PM

## 2019-09-05 NOTE — Progress Notes (Signed)
Inpatient Rehab Admissions Coordinator Note:   Per PT/OT recommendations, pt was screened for CIR candidacy by Shann Medal, PT, DPT.  At this time we are recommending a CIR consult.  I will place an order per our protocol.  Please contact me with questions.   Shann Medal, PT, DPT [phone] 09/05/19 4:16 PM

## 2019-09-05 NOTE — Progress Notes (Signed)
    [facility] has been requested to perform a transesophageal echocardiogram on David [name] for vegetation on pulmonic valve and bioprosthetic aortic valve.  After careful review of history and examination, the risks and benefits of transesophageal echocardiogram have been explained including risks of esophageal damage, perforation (1:10,000 risk), bleeding, pharyngeal hematoma as well as other potential complications associated with conscious sedation including aspiration, arrhythmia, respiratory failure and death. Alternatives to treatment were discussed, questions were answered. Patient's family is willing to proceed. Pt would fall asleep with explantation.  He does have thrombocytopenia, will check CBC in AM.    Cecilie Kicks, NP  09/05/2019 3:27 PM

## 2019-09-05 NOTE — Evaluation (Signed)
Occupational Therapy Evaluation Patient Details Name: David [name] MRN: [id number] DOB: Sep 26, 1923 Today's Date: 09/05/2019    History of Present Illness 86 y.o. male with past medical history of status post AVR, impaired glucose tolerance, hypertension, hyperlipidemia, COPD and CAD. Pt found with R weakness, R facial droop and left gaze preference. Pt found to have subcortical nontraumatic ICH (L basal ganglia) on CT scan.   Clinical Impression   PTA, pt was living with his daughter and was independent performing ADLs, IADLs, and driving. Pt currently reqiuring Mod A for UB ADLs, Mod A +2 for LB ADLs, and Mod-Max A +2 for functional transfers. Pt presenting with speech deficits, inattention to right, poor functional use of RUE (dominant hand), poor balance, and decreased cognition. Pt highly motivated to participate in therapy and has good family support. Pt will require further acute OT to facilitate safe dc. Due to pt's PLOF, high motivation, and deficits, recommend dc to CIR for intensive OT to optimize safety, independence with ADLs, and return to PLOF as well as decrease caregiver support.     Follow Up Recommendations  CIR;Supervision/Assistance - 24 hour    Equipment Recommendations  Other (comment)(Defer to next venue)    Recommendations for Other Services Rehab consult;PT consult;Speech consult     Precautions / Restrictions Precautions Precautions: Fall Precaution Comments: pushing to R Restrictions Weight Bearing Restrictions: No      Mobility Bed Mobility Overal bed mobility: Needs Assistance Bed Mobility: Supine to Sit     Supine to sit: Mod assist;+2 for physical assistance     General bed mobility comments: Mod A +2 to elevate trunk and manage RLE  Transfers Overall transfer level: Needs assistance Equipment used: 2 person hand held assist Transfers: Sit to/from [employer] Sit to Stand: Mod assist;+2 physical assistance;From elevated  surface   Squat pivot transfers: Mod assist;+2 physical assistance     General transfer comment: Mod A +2 to power up into standing with block of RLE and use of pad to manage hips. pivot to right with Mod A +2; second person behind to manage hips    Balance Overall balance assessment: Needs assistance Sitting-balance support: Bilateral upper extremity supported;Feet supported Sitting balance-Leahy Scale: Poor Sitting balance - Comments: mod-maxA sitting edge of bed Postural control: Right lateral lean;Posterior lean Standing balance support: Bilateral upper extremity supported Standing balance-Leahy Scale: Poor Standing balance comment: modA of 2 persons with bilateral knee block, strong R lean                           ADL either performed or assessed with clinical judgement   ADL Overall ADL's : Needs assistance/impaired Eating/Feeding: NPO;Sitting   Grooming: Moderate assistance;Sitting   Upper Body Bathing: Moderate assistance;Sitting   Lower Body Bathing: Maximal assistance;+2 for physical assistance;+2 for safety/equipment;Sit to/from stand   Upper Body Dressing : Moderate assistance;Sitting   Lower Body Dressing: Maximal assistance;+2 for physical assistance;+2 for safety/equipment;Sit to/from stand   Toilet Transfer: Maximal assistance;+2 for physical assistance;+2 for safety/equipment;Stand-pivot(simulated to recliner)           Functional mobility during ADLs: Maximal assistance;+2 for physical assistance;+2 for safety/equipment(stand pivot) General ADL Comments: Pt presenting with decreased balance, awareness of right side, functional use of RUE, and cognition.      Vision Baseline Vision/History: Wears glasses Wears Glasses: At all times Patient Visual Report: Other (comment)(left gaze) Vision Assessment?: Vision impaired- to be further tested in functional context Additional Comments:  Tendency for left gaze. Pt able to track to right with Max  cues. Will continue to assess vision     Perception     Praxis      Pertinent Vitals/Pain Pain Assessment: Faces Faces Pain Scale: Hurts little more Pain Location: R wrist Pain Descriptors / Indicators: Guarding Pain Intervention(s): Monitored during session;Limited activity within patient's tolerance;Repositioned     Hand Dominance Right   Extremity/Trunk Assessment Upper Extremity Assessment Upper Extremity Assessment: RUE deficits/detail RUE Deficits / Details: Increased tone. Able to perform PROM.  RUE Coordination: decreased fine motor;decreased gross motor   Lower Extremity Assessment Lower Extremity Assessment: Defer to PT evaluation RLE Deficits / Details: no AROM noted, pt with significant extensor tone at knee and hip LLE Deficits / Details: LLE WFL   Cervical / Trunk Assessment Cervical / Trunk Assessment: Other exceptions Cervical / Trunk Exceptions: pt with L gaze preference and some R inattention. Pt positioned to promote cervical rotation to right when talking with family   Communication Communication Communication: Expressive difficulties(difficult to assess receptive 2/2 impaired expressive)   Cognition Arousal/Alertness: Awake/alert Behavior During Therapy: WFL for tasks assessed/performed Overall Cognitive Status: Difficult to assess Area of Impairment: Attention;Memory;Following commands;Safety/judgement;Awareness;Problem solving                   Current Attention Level: Sustained;Focused Memory: Decreased short-term memory Following Commands: Follows one step commands inconsistently;Follows one step commands with increased time Safety/Judgement: Decreased awareness of safety;Decreased awareness of deficits Awareness: Intellectual Problem Solving: Slow processing;Difficulty sequencing;Requires verbal cues General Comments: Pt following simple commands inconsistently. Inattentive to left. Poor attention and requiring increased cues. Decreased  awreness of deficits and problem solving. Very motivated   General Comments  Daughter and son present throughout session. VSS on RA    Exercises     Shoulder Instructions      Home Living Family/patient expects to be discharged to:: Private residence Living Arrangements: Children Available Help at Discharge: Family;Available 24 hours/day Type of Home: House Home Access: Ramped entrance     Home Layout: One level     Bathroom Shower/Tub: Occupational psychologist: Handicapped height     Home Equipment: Environmental consultant - 2 wheels;Cane - quad;Wheelchair - power;Shower seat   Additional Comments: daughter providing history as pt with impaired speech  Lives With: Daughter    Prior Functioning/Environment Level of Independence: Independent        Comments: pt driving, no use of assistive devices, independent with all ADLs        OT Problem List: Decreased strength;Decreased range of motion;Decreased activity tolerance;Impaired balance (sitting and/or standing);Impaired vision/perception;Decreased coordination;Decreased cognition;Decreased safety awareness;Decreased knowledge of use of DME or AE;Decreased knowledge of precautions;Impaired UE functional use      OT Treatment/Interventions: Self-care/ADL training;Neuromuscular education;Therapeutic exercise;Energy conservation;DME and/or AE instruction;Therapeutic activities;Visual/perceptual remediation/compensation;Patient/family education;Balance training;Cognitive remediation/compensation    OT Goals(Current goals can be found in the care plan section) Acute Rehab OT Goals Patient Stated Goal: To improve mobility, attempt to return to baseline OT Goal Formulation: With patient Time For Goal Achievement: 09/19/19 Potential to Achieve Goals: Good  OT Frequency: Min 2X/week   Barriers to D/C:            Co-evaluation PT/OT/SLP Co-Evaluation/Treatment: Yes Reason for Co-Treatment: For patient/therapist safety;To  address functional/ADL transfers PT goals addressed during session: Mobility/safety with mobility;Balance;Strengthening/ROM OT goals addressed during session: ADL's and self-care      AM-PAC OT "6 Clicks" Daily Activity     Outcome Measure Help  from another person eating meals?: Total Help from another person taking care of personal grooming?: A Lot Help from another person toileting, which includes using toliet, bedpan, or urinal?: A Lot Help from another person bathing (including washing, rinsing, drying)?: A Lot Help from another person to put on and taking off regular upper body clothing?: A Lot Help from another person to put on and taking off regular lower body clothing?: A Lot 6 Click Score: 11   End of Session Equipment Utilized During Treatment: Gait belt Nurse Communication: Mobility status;Other (comment)(condom cath off)  Activity Tolerance: Patient tolerated treatment well Patient left: in chair;with call bell/phone within reach;with chair alarm set;with family/visitor present  OT Visit Diagnosis: Unsteadiness on feet (R26.81);Other abnormalities of gait and mobility (R26.89);Muscle weakness (generalized) (M62.81);Hemiplegia and hemiparesis;Other symptoms and signs involving cognitive function Hemiplegia - Right/Left: Right Hemiplegia - dominant/non-dominant: Dominant Hemiplegia - caused by: Cerebral infarction                Time: [id number] OT Time Calculation (min): 27 min Charges:  OT General Charges $OT Visit: 1 Visit OT Evaluation $OT Eval Moderate Complexity: [place], OTR/L Acute Rehab Pager: [phone] Office: [place] 09/05/2019, 3:54 PM

## 2019-09-05 NOTE — Progress Notes (Addendum)
STROKE TEAM PROGRESS NOTE   INTERVAL HISTORY Daughter and son and RN are at bedside. Pt initially sleeping but easily arousable and follows some simple commands, severe dysarthria with perseveration. Did not pass swallow, now meds with apple sauce.   Vitals:   09/05/19 0500 09/05/19 0600 09/05/19 0700 09/05/19 0800  BP: (!) 112/55 120/60 106/66 (!) 114/52  Pulse: 63 72 62 63  Resp: 15  16 15   Temp:    98.7 F (37.1 C)  TempSrc:    Oral  SpO2: 96% 97% 98% 97%  Weight:      Height:        CBC:  Recent Labs  Lab 09/04/19 1300 09/04/19 1300 09/04/19 1305 09/05/19 0812  WBC 8.4  --   --  6.7  NEUTROABS 6.6  --   --   --   HGB 15.8   < > 16.3 14.9  HCT 47.7   < > 48.0 44.5  MCV 93.5  --   --  91.0  PLT 85*  --   --  89*   < > = values in this interval not displayed.    Basic Metabolic Panel:  Recent Labs  Lab 09/04/19 1300 09/04/19 1300 09/04/19 1305 09/05/19 0812  NA 139   < > 136 139  K 5.0   < > 6.9* 4.1  CL 106   < > 106 107  CO2 23  --   --  22  GLUCOSE 143*   < > 137* 92  BUN 22   < > 33* 18  CREATININE 1.43*   < > 1.20 1.34*  CALCIUM 9.0  --   --  8.8*   < > = values in this interval not displayed.   Lipid Panel:     Component Value Date/Time   CHOL 192 09/04/2019 1300   TRIG 46 09/04/2019 1300   HDL 45 09/04/2019 1300   CHOLHDL 4.3 09/04/2019 1300   VLDL 9 09/04/2019 1300   LDLCALC 138 (H) 09/04/2019 1300   HgbA1c:  Lab Results  Component Value Date   HGBA1C 5.9 (H) 09/04/2019   Urine Drug Screen: No results found for: LABOPIA, COCAINSCRNUR, LABBENZ, AMPHETMU, THCU, LABBARB  Alcohol Level No results found for: [facility]  IMAGING past 24 hours CT Code Stroke CTA Head W/WO contrast  Result Date: 09/04/2019 CLINICAL DATA:  Code stroke. 86 year old male with aphasia and left gaze, right weakness. Last seen normal 1130 hours. Acute left basal ganglia hemorrhage on plain CT. EXAM: CT ANGIOGRAPHY HEAD AND NECK TECHNIQUE: Multidetector CT imaging of the  head and neck was performed using the standard protocol during bolus administration of intravenous contrast. Multiplanar CT image reconstructions and MIPs were obtained to evaluate the vascular anatomy. Carotid stenosis measurements (when applicable) are obtained utilizing NASCET criteria, using the distal internal carotid diameter as the denominator. CONTRAST:  149mL OMNIPAQUE IOHEXOL 350 MG/ML SOLN COMPARISON:  Head CT 1309 hours today. CT cervical spine today. FINDINGS: CTA NECK Skeleton: Cervical spine detailed separately. No acute osseous abnormality identified. Prior sternotomy. Upper chest: Mild upper lobe lung scarring. Trace retained secretions in the trachea at the thoracic inlet on series 5, image 146. Other neck: Negative. Aortic arch: Prior CABG. Calcified aortic atherosclerosis. 3 vessel arch configuration. Right carotid system: Tortuous brachiocephalic artery without stenosis despite plaque. Tortuous but otherwise normal right CCA origin. Negative right carotid bifurcation and cervical right ICA. Left carotid system: No left CCA origin stenosis despite plaque. Tortuous proximal left CCA. Minimal calcified plaque  at the left carotid bifurcation affecting the ECA origin. Negative cervical left ICA. Vertebral arteries: Tortuous proximal right subclavian artery with a kinked appearance but no stenosis. Normal right vertebral artery origin on series 8, image 144. The right vertebral is intermittently tortuous but otherwise normal to the skull base. Proximal left subclavian artery plaque without stenosis. Tortuous subclavian at the thoracic inlet. Normal left vertebral artery origin. Tortuous left V1 segment. The left vertebral is dominant with tortuosity but no atherosclerosis or stenosis to the skull base. CTA HEAD Posterior circulation: Patent distal vertebral arteries without plaque or stenosis, dominant left V4. Normal PICA origins, although the left AICA appears dominant. Patent vertebrobasilar  junction and basilar artery without stenosis. Normal SCA and PCA origins. Posterior communicating arteries are diminutive or absent. Bilateral PCA branches are within normal limits. Anterior circulation: Both ICA siphons are patent with tortuosity but only mild calcified plaque and no stenosis. Normal ophthalmic artery origins. Small infundibulum at both ICA termini, normal variant. Patent and mildly ectatic bilateral ICA terminus, MCA and ACA origins. Anterior communicating artery and bilateral ACA branches are within normal limits. Left MCA M1 segment bifurcates early without stenosis. Left MCA branches are normal aside from tortuosity. Right MCA M1 segment and bifurcation are patent without stenosis. Right MCA branches are tortuous but otherwise normal. Venous sinuses: The major dural venous sinuses appear to be patent, with better venous contrast timing on the delayed images. There is a small right parasagittal developmental venous anomaly incidentally noted (DVA normal variant series 8, image 216). Anatomic variants: Dominant left vertebral artery. Other findings: The left lentiform acute hemorrhage is negative for CTA spot sign. No abnormal vascularity is identified in that region. Delayed postcontrast images are also obtained, with no abnormal enhancement identified. Review of the MIP images confirms the above findings IMPRESSION: 1. The left basal ganglia hemorrhage is negative for CTA spot sign, with no evidence of vascular malformation, and no abnormal enhancement. 2. CTA is negative for large vessel occlusion, with minimal for age atherosclerosis in the head and neck. There is generalized arterial tortuosity. 3. Prior CABG.  Aortic Atherosclerosis (ICD10-I70.0). Electronically Signed   By: Genevie Ann M.D.   On: 09/04/2019 13:37   CT HEAD WO CONTRAST  Result Date: 09/04/2019 CLINICAL DATA:  Follow-up intracranial hemorrhage. EXAM: CT HEAD WITHOUT CONTRAST TECHNIQUE: Contiguous axial images were obtained  from the base of the skull through the vertex without intravenous contrast. COMPARISON:  09/04/2019 at 1:09 p.m. FINDINGS: Brain: The acute parenchymal hemorrhage involving the posterior left lentiform nucleus measures 4.4 x 2.5 x 2.9 cm (AP x transverse x craniocaudal) with an estimated volume of 16 mL, not significantly changed when remeasured in a similar fashion. Mild surrounding edema is unchanged with minimal regional mass effect and no midline shift. Separate from the hemorrhage, no acute infarct is identified. No extra-axial hemorrhage is evident. Hypodensities in the cerebral white matter bilaterally are nonspecific but compatible with mild chronic small vessel ischemic disease. A chronic lacunar infarct is again noted in the right lentiform nucleus/external capsule. Mild cerebral atrophy is not greater than expected for age. Vascular: Calcified atherosclerosis at the skull base. No hyperdense vessel. Skull: No fracture or suspicious osseous lesion. Sinuses/Orbits: Paranasal sinuses and mastoid air cells are clear. Bilateral cataract extraction. Other: None. IMPRESSION: 1. Unchanged acute posterior left basal ganglia hemorrhage with minimal edema. No significant mass effect. 2. Mild chronic small vessel ischemic disease. Electronically Signed   By: Logan Bores M.D.   On: 09/04/2019 19:29  CT Code Stroke CTA Neck W/WO contrast  Result Date: 09/04/2019 CLINICAL DATA:  Code stroke. 86 year old male with aphasia and left gaze, right weakness. Last seen normal 1130 hours. Acute left basal ganglia hemorrhage on plain CT. EXAM: CT ANGIOGRAPHY HEAD AND NECK TECHNIQUE: Multidetector CT imaging of the head and neck was performed using the standard protocol during bolus administration of intravenous contrast. Multiplanar CT image reconstructions and MIPs were obtained to evaluate the vascular anatomy. Carotid stenosis measurements (when applicable) are obtained utilizing NASCET criteria, using the distal  internal carotid diameter as the denominator. CONTRAST:  170mL OMNIPAQUE IOHEXOL 350 MG/ML SOLN COMPARISON:  Head CT 1309 hours today. CT cervical spine today. FINDINGS: CTA NECK Skeleton: Cervical spine detailed separately. No acute osseous abnormality identified. Prior sternotomy. Upper chest: Mild upper lobe lung scarring. Trace retained secretions in the trachea at the thoracic inlet on series 5, image 146. Other neck: Negative. Aortic arch: Prior CABG. Calcified aortic atherosclerosis. 3 vessel arch configuration. Right carotid system: Tortuous brachiocephalic artery without stenosis despite plaque. Tortuous but otherwise normal right CCA origin. Negative right carotid bifurcation and cervical right ICA. Left carotid system: No left CCA origin stenosis despite plaque. Tortuous proximal left CCA. Minimal calcified plaque at the left carotid bifurcation affecting the ECA origin. Negative cervical left ICA. Vertebral arteries: Tortuous proximal right subclavian artery with a kinked appearance but no stenosis. Normal right vertebral artery origin on series 8, image 144. The right vertebral is intermittently tortuous but otherwise normal to the skull base. Proximal left subclavian artery plaque without stenosis. Tortuous subclavian at the thoracic inlet. Normal left vertebral artery origin. Tortuous left V1 segment. The left vertebral is dominant with tortuosity but no atherosclerosis or stenosis to the skull base. CTA HEAD Posterior circulation: Patent distal vertebral arteries without plaque or stenosis, dominant left V4. Normal PICA origins, although the left AICA appears dominant. Patent vertebrobasilar junction and basilar artery without stenosis. Normal SCA and PCA origins. Posterior communicating arteries are diminutive or absent. Bilateral PCA branches are within normal limits. Anterior circulation: Both ICA siphons are patent with tortuosity but only mild calcified plaque and no stenosis. Normal ophthalmic  artery origins. Small infundibulum at both ICA termini, normal variant. Patent and mildly ectatic bilateral ICA terminus, MCA and ACA origins. Anterior communicating artery and bilateral ACA branches are within normal limits. Left MCA M1 segment bifurcates early without stenosis. Left MCA branches are normal aside from tortuosity. Right MCA M1 segment and bifurcation are patent without stenosis. Right MCA branches are tortuous but otherwise normal. Venous sinuses: The major dural venous sinuses appear to be patent, with better venous contrast timing on the delayed images. There is a small right parasagittal developmental venous anomaly incidentally noted (DVA normal variant series 8, image 216). Anatomic variants: Dominant left vertebral artery. Other findings: The left lentiform acute hemorrhage is negative for CTA spot sign. No abnormal vascularity is identified in that region. Delayed postcontrast images are also obtained, with no abnormal enhancement identified. Review of the MIP images confirms the above findings IMPRESSION: 1. The left basal ganglia hemorrhage is negative for CTA spot sign, with no evidence of vascular malformation, and no abnormal enhancement. 2. CTA is negative for large vessel occlusion, with minimal for age atherosclerosis in the head and neck. There is generalized arterial tortuosity. 3. Prior CABG.  Aortic Atherosclerosis (ICD10-I70.0). Electronically Signed   By: Genevie Ann M.D.   On: 09/04/2019 13:37   CT CERVICAL SPINE WO CONTRAST  Result Date: 09/04/2019 CLINICAL DATA:  Code stroke. 86 year old male with aphasia and left gaze, right weakness. Last seen normal 1130 hours. EXAM: CT CERVICAL SPINE WITHOUT CONTRAST TECHNIQUE: Multidetector CT imaging of the cervical spine was performed without intravenous contrast. Multiplanar CT image reconstructions were also generated. COMPARISON:  Head CT today reported separately. Thoracic spine CT 09/14/2018. FINDINGS: Alignment: Preserved  cervical lordosis. Cervicothoracic junction alignment is within normal limits. Bilateral posterior element alignment is within normal limits. Skull base and vertebrae: Visualized skull base is intact. No atlanto-occipital dissociation. No acute osseous abnormality identified. Soft tissues and spinal canal: No prevertebral fluid or swelling. No visible canal hematoma. Negative noncontrast neck soft tissues. Disc levels: Widespread cervical spine degeneration, but mild if any cervical spinal stenosis, at C5-C6 and C6-C7. Upper chest: Visible upper thoracic levels appears stable and intact. Negative visible lung apices. IMPRESSION: 1.  No acute traumatic injury identified in the cervical spine. 2. Spine degeneration but mild if any cervical spinal stenosis. Electronically Signed   By: Genevie Ann M.D.   On: 09/04/2019 13:26   MR BRAIN WO CONTRAST  Result Date: 09/05/2019 CLINICAL DATA:  Left basal ganglia hemorrhage. EXAM: MRI HEAD WITHOUT CONTRAST TECHNIQUE: Multiplanar, multiecho pulse sequences of the brain and surrounding structures were obtained without intravenous contrast. COMPARISON:  CT head without contrast and CTA head and neck 09/04/2019. FINDINGS: Brain: Acute left basal ganglia hemorrhage is stable, measuring 4.6 x 2.4 cm. Underlying mass lesion is evident. Remote lacunar infarcts in the basal ganglia bilaterally demonstrate peripheral hemosiderin rings. Remote nonhemorrhagic lacunar infarcts are present in the right caudate head. Confluent periventricular white matter changes are present bilaterally. No discrete mass present. The ventricles are of normal size. No significant extraaxial fluid collection is present. Vascular: Flow is present in the major intracranial arteries. Skull and upper cervical spine: The craniocervical junction is normal. Upper cervical spine is within normal limits. Marrow signal is unremarkable. Sinuses/Orbits: The paranasal sinuses and mastoid air cells are clear. Bilateral lens  replacements are noted. Globes and orbits are otherwise unremarkable. IMPRESSION: 1. Stable acute left basal ganglia hemorrhage measuring 4.6 x 2.4 cm. No underlying mass lesion or vascular abnormality identified. 2. Remote lacunar infarcts of the basal ganglia bilaterally. 3. Confluent periventricular and subcortical white matter disease bilaterally is advanced for age. This likely reflects the sequela of chronic microvascular ischemia. Electronically Signed   By: San Morelle M.D.   On: 09/05/2019 07:03   DG Chest Port 1 View  Result Date: 09/04/2019 CLINICAL DATA:  Cough EXAM: PORTABLE CHEST 1 VIEW COMPARISON:  09/14/2018 FINDINGS: Prior CABG. Lingular atelectasis or scarring. No confluent opacity otherwise. No effusions. No acute bony abnormality. IMPRESSION: Left basilar scarring or atelectasis. No active disease. Electronically Signed   By: Rolm Baptise M.D.   On: 09/04/2019 19:29   CT HEAD CODE STROKE WO CONTRAST  Result Date: 09/04/2019 CLINICAL DATA:  Code stroke. 86 year old male with aphasia and left gaze, right weakness. Last seen normal 1130 hours. EXAM: CT HEAD WITHOUT CONTRAST TECHNIQUE: Contiguous axial images were obtained from the base of the skull through the vertex without intravenous contrast. COMPARISON:  Brain MRI 08/30/2003. FINDINGS: Brain: Oval, lentiform hyperdense hemorrhage epicenter in they left lentiform nuclei, with blood products encompassing 38 x 24 x 35 mm (AP by transverse by CC) for an estimated intra-axial blood volume of 16 mL. No intraventricular extension. No extra-axial extension identified. Mild regional edema, minimal mass effect. No midline shift. Normal basilar cisterns. Normal ventricle size and configuration. Patchy bilateral white matter hypodensity including  chronic appearing infarct of the right external capsule. No cortically based acute infarct identified. No other intracranial blood identified. Vascular: Mild Calcified atherosclerosis at the skull  base. No suspicious intracranial vascular hyperdensity. Skull: Intact. Sinuses/Orbits: Visualized paranasal sinuses and mastoids are stable and well pneumatized. Other: No acute orbit or scalp soft tissue finding. ASPECTS [facility] Stroke Program Early CT Score) Total score (0-10 with 10 being normal): Acute hemorrhage. IMPRESSION: 1. Acute intra-axial hemorrhage in the left lentiform nuclei. Estimated blood volume 16 mL. No intraventricular or extra-axial extension. No significant intracranial mass effect. 2. Evidence of underlying chronic small vessel disease. 3. Critical Value/emergent results were called by telephone at the time of interpretation on 09/04/2019 at 1:21 pm to Dr. Rory Percy, Neurology who verbally acknowledged these results. Electronically Signed   By: Genevie Ann M.D.   On: 09/04/2019 13:21    PHYSICAL EXAM  Temp:  [97.7 F (36.5 C)-98.7 F (37.1 C)] 98.7 F (37.1 C) (04/22 0800) Pulse Rate:  [38-124] 64 (04/22 1100) Resp:  [11-26] 17 (04/22 1100) BP: (102-151)/(52-90) 129/66 (04/22 1100) SpO2:  [94 %-99 %] 97 % (04/22 1100) Weight:  [97.6 kg-97.7 kg] 97.6 kg (04/21 1600)  General - Well nourished, well developed, in no apparent distress.  Ophthalmologic - fundi not visualized due to noncooperation.  Cardiovascular - Regular rhythm and rate.  Neuro - awake alert, orientated to self, place but not to age or time. Follows central commands with eye open/close, tongue protrusion but not following peripheral commands on the left. Perseveration with answers, not able to repeat, severe dysarthria with intangible at times. PERRL, visual field full on the left but seems to have right lower quadrantopasia. Right facial droop. Tongue midline. LUE at least 4/5, LLE 3/5. However, RUE flaccid not move even with pain. RLE 2/5 withdraw to pain. Sensation, coordination not cooperative and gait not tested.  ASSESSMENT/PLAN David [name] is a 86 y.o. male with history of s/p AVR, impaired glucose  tolerance, hypertension, hyperlipidemia, COPD and CAD presenting with dysarthria, aphaisa, R sided weakness, R facial droop, L gaze preference.   Stroke:   L basal ganglia ICH - small vessel disease vs. thrombocytopenia   Code Stroke CT head L lentiform nucleus ICH. Small vessel disease.   CT CS no traumatic injury. Spine degeneration  CTA head & neck L basal ganglia ICH. No LVO. Min atherosclerosis. Prior CABG. Aortic atherosclerosis.   CT head unchanged L basal ganglia ICH. Mild small vessel disease.  MRI  Stable L basal ganglia ICH. No underlying lesion. Old B basal ganglia infarcts. Small vessel disease.  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 SBP goal < 140 . Long-term BP goal normotensive  Hyperlipidemia  Home meds:  zetia 10  Intolerant to statin  LDL 138, goal < 70  Resume zetia at discharge  Thrombocytopenia   Platelet 85->89  Close monitor  No platelet transfusion needed at this time  Dysphagia . Secondary to stroke . NPO x  meds crushed in puree . Speech on board . On IVF . Consider cortrak if not passing swallow tomorrow   Other Stroke Risk Factors  Advanced age  Former Cigarette smoker, quit 21 yrs ago  Former smokeless tobacco user  Obesity, Body mass index is 30.01 kg/m., recommend weight loss, diet and exercise as appropriate   Coronary artery disease  S/p AV replacement  Other Active Problems  COPD   CKD Cre 1.43->1.2->1.34   Hospital day # 1  This patient  is critically ill due to left BG ICH, thrombocytopenia, HTN, pulmonic valve vegetation and at significant risk of neurological worsening, death form hematoma expansion, cerebral edema, brain herniation, seizure, heart failure. This patient's care requires constant monitoring of vital signs, hemodynamics, respiratory and cardiac monitoring, review of multiple databases, neurological assessment, discussion with family, other specialists and medical decision making of high complexity. I spent 40 minutes of neurocritical care time in the care of this patient. I had long discussion with daughter and son at bedside, updated pt current condition, treatment plan and potential prognosis, and answered all the questions. They expressed understanding and appreciation.   Rosalin Hawking, MD PhD Stroke Neurology 09/05/2019 12:30 PM   To contact Stroke Continuity provider, please refer to [email or web address] After hours, contact General Neurology

## 2019-09-05 NOTE — Progress Notes (Signed)
  Echocardiogram 2D Echocardiogram has been performed.  Tamre Cass G Delayla Hoffmaster 09/05/2019, 10:02 AM

## 2019-09-05 NOTE — Evaluation (Signed)
Clinical/Bedside Swallow Evaluation Patient Details  Name: David [name] MRN: [id number] Date of Birth: Mar 01, 1924  Today's Date: 09/05/2019 Time: SLP Start Time (ACUTE ONLY): 1000 SLP Stop Time (ACUTE ONLY): 1015 SLP Time Calculation (min) (ACUTE ONLY): 15 min  Past Medical History:  Past Medical History:  Diagnosis Date  . CAD (coronary artery disease)    intolerance of statins  . COPD (chronic obstructive pulmonary disease) ([place])   . Current use of long term anticoagulation   . GERD (gastroesophageal reflux disease)   . Hyperlipidemia   . Hypertension   . Impaired glucose tolerance 02/14/2011  . LBP (low back pain)   . OA (osteoarthritis)   . 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   Past Surgical History:  Past Surgical History:  Procedure Laterality Date  . 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 CARDIAC CATHETERIZATION  10/05/1998   Recommend CABG revascularizaition  . CARDIAC CATHETERIZATION  06/11/1999   Continue medical therapy  . CARDIAC CATHETERIZATION  01/08/2007   Patent grafts, recommend medical therapy  . CARDIOVASCULAR STRESS TEST  04/22/2010   Small lateral defect which is partially reversible, no ischemic EKG changes were noted.  Marland Kitchen CAROTID DOPPLER  04/22/2010   Bilateral ICAs-no evidence of diametere reduction, significant tortuosity, or any other vascular abnormality.  . CORONARY ARTERY BYPASS GRAFT  10/09/1998   LIMA to LAD, SVG to diag, SVG sequential to 2 marginals, SVG sequential to PDA & PLA. CE#25 porcine AVR.  Marland Kitchen TRANSTHORACIC ECHOCARDIOGRAM  05/14/2012   EF 50-55%, bioprosthesis of the aortic valve with well preserved LV function, moderate concentric LVGH   HPI:  86 y.o. male with past medical history of status post AVR, impaired glucose tolerance, hypertension, hyperlipidemia, COPD and CAD admitted with acute intra-axial hemorrhage in left lentiform nuclei.     Assessment / Plan / Recommendation Clinical Impression  Pt  presents with a neurogenic dysphagia - has difficulty following commands for cranial nerve exam; mild assymmetry right lower face present.  Good oral seal without escape of PO trials.  Pt demonstrated recognition, acceptance of purees, water, ice chips with overt coughing after sips of water and eventual cough response after successive teaspoon boluses of puree, concerning for dysphagia and potential aspiration.  Recommend continuing NPO except allowing critical meds crushed in puree; likely MBS next date.  D/W son and dtr at bedside who agree with plan.  SLP will follow.  SLP Visit Diagnosis: Dysphagia, oropharyngeal phase (R13.12)    Aspiration Risk       Diet Recommendation   NPO except critical meds crushed in puree  Medication Administration: Crushed with puree    Other  Recommendations Oral Care Recommendations: Oral care QID   Follow up Recommendations Other (comment)(tba)      Frequency and Duration min 3x week  2 weeks       Prognosis Prognosis for Safe Diet Advancement: Good      Swallow Study   General Date of Onset: 09/04/19 HPI: 86 y.o. male with past medical history of status post AVR, impaired glucose tolerance, hypertension, hyperlipidemia, COPD and CAD admitted with acute intra-axial hemorrhage in left lentiform nuclei.   Type of Study: Bedside Swallow Evaluation Previous Swallow Assessment: no Diet Prior to this Study: NPO Temperature Spikes Noted: No Respiratory Status: Room air History of Recent Intubation: No Oral Cavity Assessment: Within Functional Limits Oral Cavity - Dentition: Adequate natural dentition Vision: Impaired for self-feeding Self-Feeding Abilities: Needs assist Patient Positioning: Upright in  bed Baseline Vocal Quality: Normal Volitional Cough: Cognitively unable to elicit Volitional Swallow: Unable to elicit    Oral/Motor/Sensory Function Overall Oral Motor/Sensory Function: Mild impairment Facial Symmetry: Abnormal symmetry  right;Suspected CN VII (facial) dysfunction Lingual Symmetry: Other (Comment)(difficult to assess)   Ice Chips Ice chips: Within functional limits Presentation: Spoon   Thin Liquid Thin Liquid: Impaired Presentation: Cup Pharyngeal  Phase Impairments: Cough - Immediate    Nectar Thick Nectar Thick Liquid: Not tested   Honey Thick Honey Thick Liquid: Not tested   Puree Puree: Impaired Presentation: Spoon Pharyngeal Phase Impairments: Cough - Delayed   Solid     Solid: Not tested      [name] 09/05/2019,10:37 AM  Estill Bamberg L. Tivis Ringer, [place] Office number [phone] Pager [phone]

## 2019-09-05 NOTE — Consult Note (Addendum)
Physical Medicine and Rehabilitation Consult   Reason for Consult: ICH with functional decline.  Referring Physician: Dr. Erlinda Hong  HPI: David [name] is a 86 y.o. male with history of CAD, COPD, LBP,  Tremors, AVR; who was admitted on 09/04/2019 with right facial weakness, dysarthria, right hemiparesis, left gaze preference.  History taken from daughter and son due to aphasia and lethargy.  Plans are for patient to return home with daughter who can only provide limited physical assistance at discharge.  Daughter had left for MD appointment and when she returned about a hour later, he was found lying on the floor between the beds. EMS was activated and CT head done showing acute left ICH.  Per report, acute ICH left lentiform nucleus and chronic small vessel disease.  CTA head/neck done revealing no evidence of vascular malformation and left basal ganglia hemorrhage negative for spot sign.  Follow-up CT head personally reviewed showing stable moderate sized hemorrhage.  MRI/MRA brain done 4/22 showing stable acute left basal ganglia hemorrhage and remote bilateral basal ganglia lacunar infarcts with advanced white matter disease.  Bleed felt to be due to small vessel disease versus thrombocytopenia.  Platelets were 93 at admission  Echocardiogram showing ejection fraction of 60-65% with mild left ventricular hypertrophy and bioprosthetic aortic valve as well as 1.1 cm vegetation attached to RV site of pulmonic valve.  TEE ordered for further evaluation.  Hospital was further complicated by aphasia.  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  He remains n.p.o. at this time.  Patient with limitations in balance, BLE weakness with strong right lean as well as visual impairments affecting ADLs and mobility.  CIR recommended due to functional deficits.  Review of Systems  Unable to perform ROS: Mental  acuity   Past Medical History:  Diagnosis Date   CAD (coronary artery disease)    intolerance of statins   COPD (chronic obstructive pulmonary disease) (HCC)    Current use of long term anticoagulation    GERD (gastroesophageal reflux disease)    Hyperlipidemia    Hypertension    Impaired glucose tolerance 02/14/2011   LBP (low back pain)    OA (osteoarthritis)    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    Past Surgical History:  Procedure Laterality Date   AORTIC VALVE REPLACEMENT  01/15/1999   bovine   CARDIAC CATHETERIZATION  10/05/1998   Recommend CABG revascularizaition   CARDIAC CATHETERIZATION  06/11/1999   Continue medical therapy   CARDIAC CATHETERIZATION  01/08/2007   Patent grafts, recommend medical therapy   CARDIOVASCULAR STRESS TEST  04/22/2010   Small lateral defect which is partially reversible, no ischemic EKG changes were noted.   CAROTID DOPPLER  04/22/2010   Bilateral ICAs-no evidence of diametere reduction, significant tortuosity, or any other vascular abnormality.   CORONARY ARTERY BYPASS GRAFT  10/09/1998   LIMA to LAD, SVG to diag, SVG sequential to 2 marginals, SVG sequential to PDA & PLA. CE#25 porcine AVR.   TRANSTHORACIC ECHOCARDIOGRAM  05/14/2012   EF 50-55%, bioprosthesis of the aortic valve with well preserved LV function, moderate concentric Warfield    Family History  Problem Relation Age of Onset   Coronary artery disease Other    Hypertension Other     Social History:  Widowed in 2012 and daughter has been living with him since then. Retired IT consultant. He was independent  prior to admission.  Per reports that he quit smoking about 21 years ago. His smoking use included pipe. He has quit using smokeless tobacco. Per reports has a beer at nights to help with tremors. He does not use use drugs.   Allergies  Allergen Reactions   Atorvastatin Other (See Comments)    Unknown reaction   Colesevelam Other  (See Comments)    Unknown reaction   Simvastatin Other (See Comments)    Unknown reaction   Sulfa Antibiotics Other (See Comments)    Unknown reaction    Medications Prior to Admission  Medication Sig Dispense Refill   aspirin EC 81 MG tablet Take 81 mg by mouth daily.     atenolol (TENORMIN) 25 MG tablet Take 1 tablet (25 mg total) by mouth daily. 90 tablet 3   Cholecalciferol (VITAMIN D3) 75 MCG (3000 UT) TABS Take 3,000 Units by mouth daily.     ezetimibe (ZETIA) 10 MG tablet Take 1 tablet (10 mg total) by mouth daily. 90 tablet 3   losartan (COZAAR) 25 MG tablet Take 1 tablet (25 mg total) by mouth daily. 90 tablet 3   meclizine (ANTIVERT) 12.5 MG tablet 1 po tid prn (Patient taking differently: Take 12.5 mg by mouth daily. ) 60 tablet 1   ciclopirox (PENLAC) 8 % solution Apply topically at bedtime. Apply over nail and surrounding skin. Apply daily over previous coat. After seven (7) days, may remove with alcohol and continue cycle. (Patient not taking: Reported on 09/04/2019) 6.6 mL 0   diclofenac sodium (VOLTAREN) 1 % GEL Apply 4 g topically 4 (four) times daily. (Patient not taking: Reported on 09/04/2019) 100 g 0   furosemide (LASIX) 20 MG tablet Take 1 tablet (20 mg total) by mouth daily. (Patient not taking: Reported on 09/14/2018) 30 tablet 11    Home: [place] expects to be discharged to:: Private residence Living Arrangements: Children Available Help at Discharge: Family, Available 24 hours/day Type of Home: House Home Access: Ramped entrance Bernie: One level Bathroom Shower/Tub: Multimedia programmer: Handicapped height Pulcifer: Environmental consultant - 2 wheels, Cane - quad, Wheelchair - power, Electronics engineer Comments: daughter providing history as pt with impaired speech  Lives With: Daughter  Functional History: Prior Function Level of Independence: Independent Comments: pt driving, no use of assistive devices, independent  with all ADLs Functional Status:  Mobility: Bed Mobility Overal bed mobility: Needs Assistance Bed Mobility: Supine to Sit Supine to sit: Mod assist, +2 for physical assistance General bed mobility comments: Mod A +2 to elevate trunk and manage RLE Transfers Overall transfer level: Needs assistance Equipment used: 2 person hand held assist Transfers: Sit to/from Stand, Google Transfers Sit to Stand: Mod assist, +2 physical assistance, From elevated surface Squat pivot transfers: Mod assist, +2 physical assistance General transfer comment: Mod A +2 to power up into standing with block of RLE and use of pad to manage hips. pivot to right with Mod A +2; second person behind to manage hips      ADL: ADL Overall ADL's : Needs assistance/impaired Eating/Feeding: NPO, Sitting Grooming: Moderate assistance, Sitting Upper Body Bathing: Moderate assistance, Sitting Lower Body Bathing: Maximal assistance, +2 for physical assistance, +2 for safety/equipment, Sit to/from stand Upper Body Dressing : Moderate assistance, Sitting Lower Body Dressing: Maximal assistance, +2 for physical assistance, +2 for safety/equipment, Sit to/from stand Toilet Transfer: Maximal assistance, +2 for physical assistance, +2 for safety/equipment, Stand-pivot(simulated to recliner) Functional mobility during ADLs: Maximal  assistance, +2 for physical assistance, +2 for safety/equipment(stand pivot) General ADL Comments: Pt presenting with decreased balance, awareness of right side, functional use of RUE, and cognition.   Cognition: Cognition Overall Cognitive Status: Difficult to assess Arousal/Alertness: Awake/alert Orientation Level: Oriented to person, Disoriented to place, Disoriented to time, Disoriented to situation Cognition Arousal/Alertness: Awake/alert Behavior During Therapy: WFL for tasks assessed/performed Overall Cognitive Status: Difficult to assess Area of Impairment: Attention, Memory,  Following commands, Safety/judgement, Awareness, Problem solving Current Attention Level: Sustained, Focused Memory: Decreased short-term memory Following Commands: Follows one step commands inconsistently, Follows one step commands with increased time Safety/Judgement: Decreased awareness of safety, Decreased awareness of deficits Awareness: Intellectual Problem Solving: Slow processing, Difficulty sequencing, Requires verbal cues General Comments: Pt following simple commands inconsistently. Inattentive to left. Poor attention and requiring increased cues. Decreased awreness of deficits and problem solving. Very motivated Difficult to assess due to: Impaired communication   Blood pressure (!) 148/84, pulse 82, temperature 98.6 F (37 C), temperature source Oral, resp. rate 17, height 5\' 11"  (1.803 m), weight 97.6 kg, SpO2 96 %. Physical Exam  Nursing note and vitals reviewed. Constitutional: He appears well-developed. He is easily aroused.  Morbid obesity  HENT:  Head: Normocephalic.  Resolving ecchymosis right temple.   Eyes: Right eye exhibits no discharge. Left eye exhibits no discharge.  Keeps eyes closed  Neck: No tracheal deviation present. No thyromegaly present.  Respiratory: Effort normal. No stridor. No respiratory distress.  GI: Soft. He exhibits no distension. There is no abdominal tenderness.  Musculoskeletal:     Comments: No edema or tenderness in extremities  Neurological: He is alert and easily aroused.  Lethargic and somnolent Global aphasia Right facial weakness  Garbled unintelligible sounds  Motor: No spontaneous movement noted on right side  Skin:  See above Scattered ecchymosis on upper extremities  Psychiatric:  Unable to assess due to mentation    Results for orders placed or performed during the hospital encounter of 09/04/19 (from the past 24 hour(s))  Glucose, capillary     Status: None   Collection Time: 09/05/19 11:21 AM  Result Value Ref  Range   Glucose-Capillary 87 70 - 99 mg/dL  Glucose, capillary     Status: None   Collection Time: 09/05/19  3:20 PM  Result Value Ref Range   Glucose-Capillary 85 70 - 99 mg/dL  Glucose, capillary     Status: None   Collection Time: 09/05/19 10:20 PM  Result Value Ref Range   Glucose-Capillary 88 70 - 99 mg/dL  Glucose, capillary     Status: None   Collection Time: 09/06/19 12:22 AM  Result Value Ref Range   Glucose-Capillary 85 70 - 99 mg/dL  Glucose, capillary     Status: None   Collection Time: 09/06/19  4:16 AM  Result Value Ref Range   Glucose-Capillary 74 70 - 99 mg/dL  CBC     Status: Abnormal   Collection Time: 09/06/19  5:20 AM  Result Value Ref Range   WBC 6.4 4.0 - 10.5 K/uL   RBC 5.00 4.22 - 5.81 MIL/uL   Hemoglobin 15.9 13.0 - 17.0 g/dL   HCT 47.6 39.0 - 52.0 %   MCV 95.2 80.0 - 100.0 fL   MCH 31.8 26.0 - 34.0 pg   MCHC 33.4 30.0 - 36.0 g/dL   RDW 13.5 11.5 - 15.5 %   Platelets 87 (L) 150 - 400 K/uL   nRBC 0.0 0.0 - 0.2 %  Basic metabolic panel  Status: Abnormal   Collection Time: 09/06/19  5:20 AM  Result Value Ref Range   Sodium 137 135 - 145 mmol/L   Potassium 4.5 3.5 - 5.1 mmol/L   Chloride 107 98 - 111 mmol/L   CO2 17 (L) 22 - 32 mmol/L   Glucose, Bld 85 70 - 99 mg/dL   BUN 17 8 - 23 mg/dL   Creatinine, Ser 1.31 (H) 0.61 - 1.24 mg/dL   Calcium 8.4 (L) 8.9 - 10.3 mg/dL   GFR calc non Af Amer 46 (L) >60 mL/min   GFR calc Af Amer 54 (L) >60 mL/min   Anion gap 13 5 - 15  Protime-INR     Status: None   Collection Time: 09/06/19  7:19 AM  Result Value Ref Range   Prothrombin Time 14.7 11.4 - 15.2 seconds   INR 1.2 0.8 - 1.2  Glucose, capillary     Status: None   Collection Time: 09/06/19  7:56 AM  Result Value Ref Range   Glucose-Capillary 77 70 - 99 mg/dL   CT Code Stroke CTA Head W/WO contrast  Result Date: 09/04/2019 CLINICAL DATA:  Code stroke. 86 year old male with aphasia and left gaze, right weakness. Last seen normal 1130 hours. Acute  left basal ganglia hemorrhage on plain CT. EXAM: CT ANGIOGRAPHY HEAD AND NECK TECHNIQUE: Multidetector CT imaging of the head and neck was performed using the standard protocol during bolus administration of intravenous contrast. Multiplanar CT image reconstructions and MIPs were obtained to evaluate the vascular anatomy. Carotid stenosis measurements (when applicable) are obtained utilizing NASCET criteria, using the distal internal carotid diameter as the denominator. CONTRAST:  172mL OMNIPAQUE IOHEXOL 350 MG/ML SOLN COMPARISON:  Head CT 1309 hours today. CT cervical spine today. FINDINGS: CTA NECK Skeleton: Cervical spine detailed separately. No acute osseous abnormality identified. Prior sternotomy. Upper chest: Mild upper lobe lung scarring. Trace retained secretions in the trachea at the thoracic inlet on series 5, image 146. Other neck: Negative. Aortic arch: Prior CABG. Calcified aortic atherosclerosis. 3 vessel arch configuration. Right carotid system: Tortuous brachiocephalic artery without stenosis despite plaque. Tortuous but otherwise normal right CCA origin. Negative right carotid bifurcation and cervical right ICA. Left carotid system: No left CCA origin stenosis despite plaque. Tortuous proximal left CCA. Minimal calcified plaque at the left carotid bifurcation affecting the ECA origin. Negative cervical left ICA. Vertebral arteries: Tortuous proximal right subclavian artery with a kinked appearance but no stenosis. Normal right vertebral artery origin on series 8, image 144. The right vertebral is intermittently tortuous but otherwise normal to the skull base. Proximal left subclavian artery plaque without stenosis. Tortuous subclavian at the thoracic inlet. Normal left vertebral artery origin. Tortuous left V1 segment. The left vertebral is dominant with tortuosity but no atherosclerosis or stenosis to the skull base. CTA HEAD Posterior circulation: Patent distal vertebral arteries without plaque  or stenosis, dominant left V4. Normal PICA origins, although the left AICA appears dominant. Patent vertebrobasilar junction and basilar artery without stenosis. Normal SCA and PCA origins. Posterior communicating arteries are diminutive or absent. Bilateral PCA branches are within normal limits. Anterior circulation: Both ICA siphons are patent with tortuosity but only mild calcified plaque and no stenosis. Normal ophthalmic artery origins. Small infundibulum at both ICA termini, normal variant. Patent and mildly ectatic bilateral ICA terminus, MCA and ACA origins. Anterior communicating artery and bilateral ACA branches are within normal limits. Left MCA M1 segment bifurcates early without stenosis. Left MCA branches are normal aside from tortuosity. Right  MCA M1 segment and bifurcation are patent without stenosis. Right MCA branches are tortuous but otherwise normal. Venous sinuses: The major dural venous sinuses appear to be patent, with better venous contrast timing on the delayed images. There is a small right parasagittal developmental venous anomaly incidentally noted (DVA normal variant series 8, image 216). Anatomic variants: Dominant left vertebral artery. Other findings: The left lentiform acute hemorrhage is negative for CTA spot sign. No abnormal vascularity is identified in that region. Delayed postcontrast images are also obtained, with no abnormal enhancement identified. Review of the MIP images confirms the above findings IMPRESSION: 1. The left basal ganglia hemorrhage is negative for CTA spot sign, with no evidence of vascular malformation, and no abnormal enhancement. 2. CTA is negative for large vessel occlusion, with minimal for age atherosclerosis in the head and neck. There is generalized arterial tortuosity. 3. Prior CABG.  Aortic Atherosclerosis (ICD10-I70.0). Electronically Signed   By: Genevie Ann M.D.   On: 09/04/2019 13:37   CT HEAD WO CONTRAST  Result Date: 09/04/2019 CLINICAL DATA:   Follow-up intracranial hemorrhage. EXAM: CT HEAD WITHOUT CONTRAST TECHNIQUE: Contiguous axial images were obtained from the base of the skull through the vertex without intravenous contrast. COMPARISON:  09/04/2019 at 1:09 p.m. FINDINGS: Brain: The acute parenchymal hemorrhage involving the posterior left lentiform nucleus measures 4.4 x 2.5 x 2.9 cm (AP x transverse x craniocaudal) with an estimated volume of 16 mL, not significantly changed when remeasured in a similar fashion. Mild surrounding edema is unchanged with minimal regional mass effect and no midline shift. Separate from the hemorrhage, no acute infarct is identified. No extra-axial hemorrhage is evident. Hypodensities in the cerebral white matter bilaterally are nonspecific but compatible with mild chronic small vessel ischemic disease. A chronic lacunar infarct is again noted in the right lentiform nucleus/external capsule. Mild cerebral atrophy is not greater than expected for age. Vascular: Calcified atherosclerosis at the skull base. No hyperdense vessel. Skull: No fracture or suspicious osseous lesion. Sinuses/Orbits: Paranasal sinuses and mastoid air cells are clear. Bilateral cataract extraction. Other: None. IMPRESSION: 1. Unchanged acute posterior left basal ganglia hemorrhage with minimal edema. No significant mass effect. 2. Mild chronic small vessel ischemic disease. Electronically Signed   By: Logan Bores M.D.   On: 09/04/2019 19:29   CT Code Stroke CTA Neck W/WO contrast  Result Date: 09/04/2019 CLINICAL DATA:  Code stroke. 86 year old male with aphasia and left gaze, right weakness. Last seen normal 1130 hours. Acute left basal ganglia hemorrhage on plain CT. EXAM: CT ANGIOGRAPHY HEAD AND NECK TECHNIQUE: Multidetector CT imaging of the head and neck was performed using the standard protocol during bolus administration of intravenous contrast. Multiplanar CT image reconstructions and MIPs were obtained to evaluate the vascular  anatomy. Carotid stenosis measurements (when applicable) are obtained utilizing NASCET criteria, using the distal internal carotid diameter as the denominator. CONTRAST:  136mL OMNIPAQUE IOHEXOL 350 MG/ML SOLN COMPARISON:  Head CT 1309 hours today. CT cervical spine today. FINDINGS: CTA NECK Skeleton: Cervical spine detailed separately. No acute osseous abnormality identified. Prior sternotomy. Upper chest: Mild upper lobe lung scarring. Trace retained secretions in the trachea at the thoracic inlet on series 5, image 146. Other neck: Negative. Aortic arch: Prior CABG. Calcified aortic atherosclerosis. 3 vessel arch configuration. Right carotid system: Tortuous brachiocephalic artery without stenosis despite plaque. Tortuous but otherwise normal right CCA origin. Negative right carotid bifurcation and cervical right ICA. Left carotid system: No left CCA origin stenosis despite plaque. Tortuous proximal left  CCA. Minimal calcified plaque at the left carotid bifurcation affecting the ECA origin. Negative cervical left ICA. Vertebral arteries: Tortuous proximal right subclavian artery with a kinked appearance but no stenosis. Normal right vertebral artery origin on series 8, image 144. The right vertebral is intermittently tortuous but otherwise normal to the skull base. Proximal left subclavian artery plaque without stenosis. Tortuous subclavian at the thoracic inlet. Normal left vertebral artery origin. Tortuous left V1 segment. The left vertebral is dominant with tortuosity but no atherosclerosis or stenosis to the skull base. CTA HEAD Posterior circulation: Patent distal vertebral arteries without plaque or stenosis, dominant left V4. Normal PICA origins, although the left AICA appears dominant. Patent vertebrobasilar junction and basilar artery without stenosis. Normal SCA and PCA origins. Posterior communicating arteries are diminutive or absent. Bilateral PCA branches are within normal limits. Anterior  circulation: Both ICA siphons are patent with tortuosity but only mild calcified plaque and no stenosis. Normal ophthalmic artery origins. Small infundibulum at both ICA termini, normal variant. Patent and mildly ectatic bilateral ICA terminus, MCA and ACA origins. Anterior communicating artery and bilateral ACA branches are within normal limits. Left MCA M1 segment bifurcates early without stenosis. Left MCA branches are normal aside from tortuosity. Right MCA M1 segment and bifurcation are patent without stenosis. Right MCA branches are tortuous but otherwise normal. Venous sinuses: The major dural venous sinuses appear to be patent, with better venous contrast timing on the delayed images. There is a small right parasagittal developmental venous anomaly incidentally noted (DVA normal variant series 8, image 216). Anatomic variants: Dominant left vertebral artery. Other findings: The left lentiform acute hemorrhage is negative for CTA spot sign. No abnormal vascularity is identified in that region. Delayed postcontrast images are also obtained, with no abnormal enhancement identified. Review of the MIP images confirms the above findings IMPRESSION: 1. The left basal ganglia hemorrhage is negative for CTA spot sign, with no evidence of vascular malformation, and no abnormal enhancement. 2. CTA is negative for large vessel occlusion, with minimal for age atherosclerosis in the head and neck. There is generalized arterial tortuosity. 3. Prior CABG.  Aortic Atherosclerosis (ICD10-I70.0). Electronically Signed   By: Genevie Ann M.D.   On: 09/04/2019 13:37   CT CERVICAL SPINE WO CONTRAST  Result Date: 09/04/2019 CLINICAL DATA:  Code stroke. 86 year old male with aphasia and left gaze, right weakness. Last seen normal 1130 hours. EXAM: CT CERVICAL SPINE WITHOUT CONTRAST TECHNIQUE: Multidetector CT imaging of the cervical spine was performed without intravenous contrast. Multiplanar CT image reconstructions were also  generated. COMPARISON:  Head CT today reported separately. Thoracic spine CT 09/14/2018. FINDINGS: Alignment: Preserved cervical lordosis. Cervicothoracic junction alignment is within normal limits. Bilateral posterior element alignment is within normal limits. Skull base and vertebrae: Visualized skull base is intact. No atlanto-occipital dissociation. No acute osseous abnormality identified. Soft tissues and spinal canal: No prevertebral fluid or swelling. No visible canal hematoma. Negative noncontrast neck soft tissues. Disc levels: Widespread cervical spine degeneration, but mild if any cervical spinal stenosis, at C5-C6 and C6-C7. Upper chest: Visible upper thoracic levels appears stable and intact. Negative visible lung apices. IMPRESSION: 1.  No acute traumatic injury identified in the cervical spine. 2. Spine degeneration but mild if any cervical spinal stenosis. Electronically Signed   By: Genevie Ann M.D.   On: 09/04/2019 13:26   MR BRAIN WO CONTRAST  Result Date: 09/05/2019 CLINICAL DATA:  Left basal ganglia hemorrhage. EXAM: MRI HEAD WITHOUT CONTRAST TECHNIQUE: Multiplanar, multiecho pulse sequences of  the brain and surrounding structures were obtained without intravenous contrast. COMPARISON:  CT head without contrast and CTA head and neck 09/04/2019. FINDINGS: Brain: Acute left basal ganglia hemorrhage is stable, measuring 4.6 x 2.4 cm. Underlying mass lesion is evident. Remote lacunar infarcts in the basal ganglia bilaterally demonstrate peripheral hemosiderin rings. Remote nonhemorrhagic lacunar infarcts are present in the right caudate head. Confluent periventricular white matter changes are present bilaterally. No discrete mass present. The ventricles are of normal size. No significant extraaxial fluid collection is present. Vascular: Flow is present in the major intracranial arteries. Skull and upper cervical spine: The craniocervical junction is normal. Upper cervical spine is within normal  limits. Marrow signal is unremarkable. Sinuses/Orbits: The paranasal sinuses and mastoid air cells are clear. Bilateral lens replacements are noted. Globes and orbits are otherwise unremarkable. IMPRESSION: 1. Stable acute left basal ganglia hemorrhage measuring 4.6 x 2.4 cm. No underlying mass lesion or vascular abnormality identified. 2. Remote lacunar infarcts of the basal ganglia bilaterally. 3. Confluent periventricular and subcortical white matter disease bilaterally is advanced for age. This likely reflects the sequela of chronic microvascular ischemia. Electronically Signed   By: San Morelle M.D.   On: 09/05/2019 07:03   DG Chest Port 1 View  Result Date: 09/04/2019 CLINICAL DATA:  Cough EXAM: PORTABLE CHEST 1 VIEW COMPARISON:  09/14/2018 FINDINGS: Prior CABG. Lingular atelectasis or scarring. No confluent opacity otherwise. No effusions. No acute bony abnormality. IMPRESSION: Left basilar scarring or atelectasis. No active disease. Electronically Signed   By: Rolm Baptise M.D.   On: 09/04/2019 19:29   ECHOCARDIOGRAM COMPLETE  Result Date: 09/05/2019    ECHOCARDIOGRAM REPORT   Patient Name:   ELLIE DEMETRO Date of Exam: 09/05/2019 Medical Rec #:  [id number]      Height:       71.0 in Accession #:    [id number]     Weight:       215.2 lb Date of Birth:  1923-06-06      BSA:          2.175 m Patient Age:    8 years       BP:           114/52 mmHg Patient Gender: M              HR:           50 bpm. Exam Location:  Inpatient Procedure: 2D Echo, Cardiac Doppler, Color Doppler and Intracardiac            Opacification Agent                        STAT ECHO Reported to: Dr. Loralie Champagne on 09/05/2019 10:00:00 AM. Indications:    Stroke 434.91 / I163.9  History:        Patient has prior history of Echocardiogram examinations, most                 recent 12/25/2018. CAD, COPD, Aortic Valve Disease; Risk                 Factors:Hypertension, Dyslipidemia and GERD.                 Aortic Valve: valve  is present in the aortic position.  Sonographer:    Jonelle Sidle Dance Referring Phys: [place] Comments: Suboptimal subcostal window. IMPRESSIONS  1. Left ventricular ejection fraction, by estimation, is 60 to 65%. The left  ventricle has normal function. The left ventricle has no regional wall motion abnormalities. There is mild left ventricular hypertrophy. Left ventricular diastolic parameters are consistent with Grade I diastolic dysfunction (impaired relaxation).  2. Right ventricular systolic function is mildly reduced. The right ventricular size is mildly enlarged. Tricuspid regurgitation signal is inadequate for assessing PA pressure.  3. The mitral valve is normal in structure. No evidence of mitral valve regurgitation. No evidence of mitral stenosis.  4. Bioprosthetic aortic valve. No significant stenosis or regurgitation noted. The valve was not visualized well, cannot rule out vegetation. The annulus looks thickened, uncertain significance.  5. The pulmonic valve was abnormal. There is a 1.1 cm vegetation attached to the RV side of the pulmonic valve. Trivial PI, no pulmonic stenosis.  6. Aortic dilatation noted. There is mild dilatation of the aortic root measuring 42 mm.  7. The inferior vena cava is normal in size with greater than 50% respiratory variability, suggesting right atrial pressure of 3 mmHg.  8. Would suggest TEE to assess pulmonic valve vegetation and also to assess more closely the aortic valve. FINDINGS  Left Ventricle: Left ventricular ejection fraction, by estimation, is 60 to 65%. The left ventricle has normal function. The left ventricle has no regional wall motion abnormalities. Definity contrast agent was given IV to delineate the left ventricular  endocardial borders. The left ventricular internal cavity size was normal in size. There is mild left ventricular hypertrophy. Left ventricular diastolic parameters are consistent with Grade I diastolic dysfunction  (impaired relaxation). Right Ventricle: The right ventricular size is mildly enlarged. No increase in right ventricular wall thickness. Right ventricular systolic function is mildly reduced. Tricuspid regurgitation signal is inadequate for assessing PA pressure. Left Atrium: Left atrial size was normal in size. Right Atrium: Right atrial size was normal in size. Pericardium: There is no evidence of pericardial effusion. Mitral Valve: The mitral valve is normal in structure. There is mild calcification of the mitral valve leaflet(s). Mild mitral annular calcification. No evidence of mitral valve regurgitation. No evidence of mitral valve stenosis. Tricuspid Valve: The tricuspid valve is normal in structure. Tricuspid valve regurgitation is not demonstrated. Aortic Valve: Bioprosthetic aortic valve. No significant stenosis or regurgitation noted. The valve was not visualized well, cannot rule out vegetation. The annulus looks thickened, uncertain significance. The aortic valve has been repaired/replaced. Aortic valve regurgitation is not visualized. No aortic stenosis is present. There is a valve present in the aortic position. Pulmonic Valve: The pulmonic valve was abnormal. Pulmonic valve regurgitation is trivial. No evidence of pulmonic stenosis. Aorta: Aortic dilatation noted. There is mild dilatation of the aortic root measuring 42 mm. Venous: The inferior vena cava is normal in size with greater than 50% respiratory variability, suggesting right atrial pressure of 3 mmHg. IAS/Shunts: The atrial septum is grossly normal.  LEFT VENTRICLE PLAX 2D LVIDd:         3.50 cm  Diastology LVIDs:         2.90 cm  LV e' lateral:   6.42 cm/s LV PW:         1.30 cm  LV E/e' lateral: 16.2 LV IVS:        1.30 cm  LV e' medial:    4.79 cm/s LVOT diam:     1.80 cm  LV E/e' medial:  21.7 LV SV:         71 LV SV Index:   33 LVOT Area:     2.54 cm  RIGHT VENTRICLE  IVC RV Basal diam:  2.70 cm    IVC diam: 2.00 cm RV S  prime:     6.85 cm/s TAPSE (M-mode): 1.4 cm LEFT ATRIUM             Index LA diam:        3.40 cm 1.56 cm/m LA Vol (A2C):   61.4 ml 28.23 ml/m LA Vol (A4C):   37.4 ml 17.19 ml/m LA Biplane Vol: 50.4 ml 23.17 ml/m  AORTIC VALVE LVOT Vmax:   82.80 cm/s LVOT Vmean:  63.300 cm/s LVOT VTI:    0.280 m  AORTA Ao Root diam: 4.00 cm Ao Asc diam:  4.10 cm MITRAL VALVE MV Area (PHT): 2.45 cm     SHUNTS MV Decel Time: 310 msec     Systemic VTI:  0.28 m MV E velocity: 104.00 cm/s  Systemic Diam: 1.80 cm MV A velocity: 115.00 cm/s MV E/A ratio:  0.90 Loralie Champagne MD Electronically signed by Loralie Champagne MD Signature Date/Time: 09/05/2019/2:50:35 PM    Final    CT HEAD CODE STROKE WO CONTRAST  Result Date: 09/04/2019 CLINICAL DATA:  Code stroke. 86 year old male with aphasia and left gaze, right weakness. Last seen normal 1130 hours. EXAM: CT HEAD WITHOUT CONTRAST TECHNIQUE: Contiguous axial images were obtained from the base of the skull through the vertex without intravenous contrast. COMPARISON:  Brain MRI 08/30/2003. FINDINGS: Brain: Oval, lentiform hyperdense hemorrhage epicenter in they left lentiform nuclei, with blood products encompassing 38 x 24 x 35 mm (AP by transverse by CC) for an estimated intra-axial blood volume of 16 mL. No intraventricular extension. No extra-axial extension identified. Mild regional edema, minimal mass effect. No midline shift. Normal basilar cisterns. Normal ventricle size and configuration. Patchy bilateral white matter hypodensity including chronic appearing infarct of the right external capsule. No cortically based acute infarct identified. No other intracranial blood identified. Vascular: Mild Calcified atherosclerosis at the skull base. No suspicious intracranial vascular hyperdensity. Skull: Intact. Sinuses/Orbits: Visualized paranasal sinuses and mastoids are stable and well pneumatized. Other: No acute orbit or scalp soft tissue finding. ASPECTS [facility] Stroke Program Early  CT Score) Total score (0-10 with 10 being normal): Acute hemorrhage. IMPRESSION: 1. Acute intra-axial hemorrhage in the left lentiform nuclei. Estimated blood volume 16 mL. No intraventricular or extra-axial extension. No significant intracranial mass effect. 2. Evidence of underlying chronic small vessel disease. 3. Critical Value/emergent results were called by telephone at the time of interpretation on 09/04/2019 at 1:21 pm to Dr. Rory Percy, Neurology who verbally acknowledged these results. Electronically Signed   By: Genevie Ann M.D.   On: 09/04/2019 13:21    Assessment/Plan: Diagnosis: acute left basal ganglia hemorrhage and remote bilateral basal ganglia lacunar infarcts Stroke: Continue secondary stroke prophylaxis and Risk Factor Modification listed below:   Blood Pressure Management:  Continue current medication with prn's with permisive HTN per primary team Statin Agent:   Prediabetes management:   Left sided hemiparesis: fit for orthosis to prevent contractures (resting hand splint for day, wrist cock up splint at night, PRAFO, etc) PT/OT for mobility, ADL training  Motor recovery: Fluoxetine Labs and images (see above) independently reviewed.  Records reviewed and summated above.  1. Does the need for close, 24 hr/day medical supervision in concert with the patient's rehab needs make it unreasonable for this patient to be served in a less intensive setting? Yes  2. Co-Morbidities requiring supervision/potential complications: CAD, COPD (monitor O2 sats and respiratory rate with increased exertion), LBP,  Tremors, AVR,  Thrombocytopenia (repeat labs, < 60,000/mm3 no resistive exercise), global aphasia 3. Due to bladder management, bowel management, safety, skin/wound care, disease management, medication administration, pain management and patient education, does the patient require 24 hr/day rehab nursing? Yes 4. Does the patient require coordinated care of a physician, rehab nurse, therapy  disciplines of PT/OT/SLP to address physical and functional deficits in the context of the above medical diagnosis(es)? Yes Addressing deficits in the following areas: balance, endurance, locomotion, strength, transferring, bowel/bladder control, bathing, dressing, feeding, grooming, toileting, cognition, speech, language, swallowing and psychosocial support 5. Can the patient actively participate in an intensive therapy program of at least 3 hrs of therapy per day at least 5 days per week? Potentially 6. The potential for patient to make measurable gains while on inpatient rehab is excellent 7. Anticipated functional outcomes upon discharge from inpatient rehab are min assist  with PT, min assist with OT, min assist with SLP. 8. Estimated rehab length of stay to reach the above functional goals is: 22-25 days. 9. Anticipated discharge destination: Home 10. Overall Rehab/Functional Prognosis: good  RECOMMENDATIONS: This patient's condition is appropriate for continued rehabilitative care in the following setting: CIR medically stable and medical work-up complete. Patient has agreed to participate in recommended program. Potentially Note that insurance prior authorization may be required for reimbursement for recommended care.  Comment: Rehab Admissions Coordinator to follow up.  I have personally performed a face to face diagnostic evaluation, including, but not limited to relevant history and physical exam findings, of this patient and developed relevant assessment and plan.  Additionally, I have reviewed and concur with the physician assistant's documentation above.   Delice Lesch, MD, ABPMR Bary Leriche, PA-C 09/06/2019

## 2019-09-05 NOTE — Evaluation (Signed)
Speech Language Pathology Evaluation Patient Details Name: David [name] MRN: [id number] DOB: 04/06/1924 Today's Date: 09/05/2019 Time: 1015-1030 SLP Time Calculation (min) (ACUTE ONLY): 15 min  Problem List:  Patient Active Problem List   Diagnosis Date Noted  . ICH (intracerebral hemorrhage) ([place]) 09/04/2019  . Tremor 05/20/2019  . 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 Chest pain, atypical 09/20/2018  . Onychomycosis 06/06/2018  . Acute pain of left knee 08/18/2017  . Wart 06/05/2017  . Acute upper respiratory infection 05/12/2016  . Skin irritation from shaving 12/02/2015  . Benign paroxysmal positional vertigo 03/09/2015  . Plantar fasciitis, bilateral 12/02/2014  . Constipation 07/17/2014  . Cough 04/09/2014  . S/P AVR (aortic valve replacement) 11/04/2013  . Elevated hemoglobin ([place]) 12/14/2012  . Well adult exam 11/12/2012  . 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 Impaired glucose tolerance 02/14/2011  . Edema 10/22/2010  . Actinic keratoses 10/22/2010  . THROMBOCYTOPENIA 01/22/2010  . MELANOMA 03/05/2009  . TOBACCO USE, QUIT 02/27/2009  . Neoplasm of uncertain behavior of skin 05/08/2007  . Dyslipidemia 02/08/2007  . Essential hypertension 02/08/2007  . Coronary atherosclerosis 02/08/2007  . COPD 02/08/2007  . GERD 02/08/2007  . OSTEOARTHRITIS 02/08/2007  . LOW BACK PAIN 02/08/2007   Past Medical History:  Past Medical History:  Diagnosis Date  . CAD (coronary artery disease)    intolerance of statins  . COPD (chronic obstructive pulmonary disease) ([place])   . Current use of long term anticoagulation   . GERD (gastroesophageal reflux disease)   . Hyperlipidemia   . Hypertension   . Impaired glucose tolerance 02/14/2011  . LBP (low back pain)   . OA (osteoarthritis)   . 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   Past Surgical History:  Past Surgical History:  Procedure Laterality Date  . 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  CARDIAC CATHETERIZATION  10/05/1998   Recommend CABG revascularizaition  . CARDIAC CATHETERIZATION  06/11/1999   Continue medical therapy  . CARDIAC CATHETERIZATION  01/08/2007   Patent grafts, recommend medical therapy  . CARDIOVASCULAR STRESS TEST  04/22/2010   Small lateral defect which is partially reversible, no ischemic EKG changes were noted.  Marland Kitchen CAROTID DOPPLER  04/22/2010   Bilateral ICAs-no evidence of diametere reduction, significant tortuosity, or any other vascular abnormality.  . CORONARY ARTERY BYPASS GRAFT  10/09/1998   LIMA to LAD, SVG to diag, SVG sequential to 2 marginals, SVG sequential to PDA & PLA. CE#25 porcine AVR.  Marland Kitchen TRANSTHORACIC ECHOCARDIOGRAM  05/14/2012   EF 50-55%, bioprosthesis of the aortic valve with well preserved LV function, moderate concentric LVGH   HPI:  86 y.o. male with past medical history of status post AVR, impaired glucose tolerance, hypertension, hyperlipidemia, COPD and CAD admitted with acute intra-axial hemorrhage in left lentiform nuclei.     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  Demonstrates inattention to right, able to name objects to confrontation 4/8 opportunities, stated name and address, unable to repeat single words or produce automatic sequences (counting, DOW).  Produces long sequences of utterances that are generally unintelligible, dysarthric in nature.  Discussed with pt's son and daughter the nature of aphasia, plans for next few days with regard to therapies.  They verbalize  understanding.  SLP will follow for aphasia and dysphagia therapies.     SLP Assessment  SLP Recommendation/Assessment: Patient needs continued Speech Lanaguage Pathology  Services SLP Visit Diagnosis: Aphasia (R47.01)    Follow Up Recommendations  Other (comment)(tba)    Frequency and Duration min 3x week  2 weeks      SLP Evaluation Cognition  Overall Cognitive Status: Impaired/Different from baseline Arousal/Alertness: Awake/alert Orientation Level: Oriented to person       Comprehension  Auditory Comprehension Overall Auditory Comprehension: Impaired Yes/No Questions: Impaired Basic Biographical Questions: 0-25% accurate Commands: Impaired One Step Basic Commands: 0-24% accurate Conversation: Simple Interfering Components: Attention;Processing speed Visual Recognition/Discrimination Discrimination: Exceptions to [facility] Common Objects: Unable to indentify    Expression Expression Primary Mode of Expression: Verbal Verbal Expression Overall Verbal Expression: Impaired Automatic Speech: (states name, unable to count or state DOW) Level of Generative/Spontaneous Verbalization: Word Repetition: Impaired Level of Impairment: Word level Naming: Impairment Responsive: Not tested Confrontation: Impaired Common Objects: Unable to indentify Convergent: 0-24% accurate Pragmatics: No impairment Written Expression Dominant Hand: Right   Oral / Motor  Oral Motor/Sensory Function Overall Oral Motor/Sensory Function: Mild impairment Facial Symmetry: Abnormal symmetry right;Suspected CN VII (facial) dysfunction Lingual Symmetry: Other (Comment)(unable to follow commands) Motor Speech Overall Motor Speech: Impaired Phonation: Normal Resonance: Within functional limits Articulation: Impaired Level of Impairment: Phrase Intelligibility: Intelligibility reduced Word: 75-100% accurate   GO                    David [name] 09/05/2019, 10:53 AM   David [name], [place] Office number [phone] Pager [phone]

## 2019-09-06 ENCOUNTER — Encounter (HOSPITAL_COMMUNITY)
Admission: EM | Disposition: A | Discharge status: Rehabilitation Facility | Payer: Self-pay | Source: Home / Self Care | Attending: Internal Medicine

## 2019-09-06 ENCOUNTER — Other Ambulatory Visit (HOSPITAL_COMMUNITY): Payer: Medicare Other

## 2019-09-06 ENCOUNTER — Inpatient Hospital Stay (HOSPITAL_COMMUNITY): Payer: Medicare Other

## 2019-09-06 DIAGNOSIS — M545 Low back pain: Secondary | ICD-10-CM

## 2019-09-06 DIAGNOSIS — I610 Nontraumatic intracerebral hemorrhage in hemisphere, subcortical: Principal | ICD-10-CM

## 2019-09-06 DIAGNOSIS — R251 Tremor, unspecified: Secondary | ICD-10-CM

## 2019-09-06 DIAGNOSIS — J449 Chronic obstructive pulmonary disease, unspecified: Secondary | ICD-10-CM

## 2019-09-06 DIAGNOSIS — D696 Thrombocytopenia, unspecified: Secondary | ICD-10-CM

## 2019-09-06 DIAGNOSIS — Z952 Presence of prosthetic heart valve: Secondary | ICD-10-CM

## 2019-09-06 DIAGNOSIS — I2510 Atherosclerotic heart disease of native coronary artery without angina pectoris: Secondary | ICD-10-CM

## 2019-09-06 DIAGNOSIS — G8929 Other chronic pain: Secondary | ICD-10-CM

## 2019-09-06 DIAGNOSIS — R4701 Aphasia: Secondary | ICD-10-CM

## 2019-09-06 LAB — CBC
HCT: 47.6 % (ref 39.0–52.0)
Hemoglobin: 15.9 g/dL (ref 13.0–17.0)
MCH: 31.8 pg (ref 26.0–34.0)
MCHC: 33.4 g/dL (ref 30.0–36.0)
MCV: 95.2 fL (ref 80.0–100.0)
Platelets: 87 10*3/uL — ABNORMAL LOW (ref 150–400)
RBC: 5 MIL/uL (ref 4.22–5.81)
RDW: 13.5 % (ref 11.5–15.5)
WBC: 6.4 10*3/uL (ref 4.0–10.5)
nRBC: 0 % (ref 0.0–0.2)

## 2019-09-06 LAB — BASIC METABOLIC PANEL
Anion gap: 13 (ref 5–15)
BUN: 17 mg/dL (ref 8–23)
CO2: 17 mmol/L — ABNORMAL LOW (ref 22–32)
Calcium: 8.4 mg/dL — ABNORMAL LOW (ref 8.9–10.3)
Chloride: 107 mmol/L (ref 98–111)
Creatinine, Ser: 1.31 mg/dL — ABNORMAL HIGH (ref 0.61–1.24)
GFR calc Af Amer: 54 mL/min — ABNORMAL LOW (ref >60)
GFR calc non Af Amer: 46 mL/min — ABNORMAL LOW (ref >60)
Glucose, Bld: 85 mg/dL (ref 70–99)
Potassium: 4.5 mmol/L (ref 3.5–5.1)
Sodium: 137 mmol/L (ref 135–145)

## 2019-09-06 LAB — GLUCOSE, CAPILLARY
Glucose-Capillary: 127 mg/dL — ABNORMAL HIGH (ref 70–99)
Glucose-Capillary: 133 mg/dL — ABNORMAL HIGH (ref 70–99)
Glucose-Capillary: 137 mg/dL — ABNORMAL HIGH (ref 70–99)
Glucose-Capillary: 74 mg/dL (ref 70–99)
Glucose-Capillary: 77 mg/dL (ref 70–99)
Glucose-Capillary: 85 mg/dL (ref 70–99)

## 2019-09-06 LAB — PROTIME-INR
INR: 1.2 (ref 0.8–1.2)
Prothrombin Time: 14.7 seconds (ref 11.4–15.2)

## 2019-09-06 SURGERY — ECHOCARDIOGRAM, TRANSESOPHAGEAL
Anesthesia: Monitor Anesthesia Care

## 2019-09-06 MED ORDER — INSULIN ASPART 100 UNIT/ML ~~LOC~~ SOLN: 0.0000 [IU] | Freq: Every day | SUBCUTANEOUS | Status: DC

## 2019-09-06 MED ORDER — PANTOPRAZOLE SODIUM 40 MG PO TBEC
40.0000 mg | DELAYED_RELEASE_TABLET | Freq: Every day | ORAL | Status: DC
  Administered 2019-09-06 – 2019-09-08 (×3): 40 mg via ORAL
  Filled 2019-09-06 (×3): qty 1

## 2019-09-06 MED ORDER — INSULIN ASPART 100 UNIT/ML ~~LOC~~ SOLN: 0.0000 [IU] | Freq: Three times a day (TID) | SUBCUTANEOUS | Status: DC

## 2019-09-06 MED ORDER — RESOURCE THICKENUP CLEAR PO POWD
Freq: Once | ORAL | Status: AC
  Filled 2019-09-06: qty 125

## 2019-09-06 NOTE — Progress Notes (Signed)
  Speech Language Pathology Treatment: Dysphagia  Patient Details Name: David [name] MRN: [id number] DOB: 1923-10-08 Today's Date: 09/06/2019 Time: [id number] SLP Time Calculation (min) (ACUTE ONLY): 19 min  Assessment / Plan / Recommendation Clinical Impression  Pt transferred out of ICU; son and dtr present.  Talking more today, communication remains unintelligible due to aphasia.  Pt participated in trials of thin and honey-thick liquids, purees. Fed himself using spoon in LUE.  Notable decreased sensation/awareness right oral cavity with spillage and residue.  Thin liquids elicit explosive coughing, concerning for aspiration. Honey liquids elicit fewer s/s of aspiration. Recommend proceeding with MBS this am.   Pt should do sufficiently well that we can avoid a cortrak.  D/W RN, family.   HPI HPI: 86 y.o. male with past medical history of status post AVR, impaired glucose tolerance, hypertension, hyperlipidemia, COPD and CAD admitted with acute intra-axial hemorrhage in left lentiform nuclei.        SLP Plan  MBS       Recommendations   tbd                Oral Care Recommendations: Oral care BID SLP Visit Diagnosis: Dysphagia, unspecified (R13.10) Plan: MBS       GO                [name] 09/06/2019, 10:21 AM  Estill Bamberg L. Tivis Ringer, [name] Office number [phone] Pager [phone]

## 2019-09-06 NOTE — Social Work (Signed)
CSW was unable to complete sbirt due to pt working with PT/OT. CSW may attempt again at more appropriate time.   Emeterio Reeve, Latanya Presser, [place] Social Worker [phone]

## 2019-09-06 NOTE — Progress Notes (Signed)
Physical Therapy Treatment Patient Details Name: David [name] MRN: [id number] DOB: 07-Apr-1924 Today's Date: 09/06/2019    History of Present Illness 86 y.o. male with past medical history of status post AVR, impaired glucose tolerance, hypertension, hyperlipidemia, COPD and CAD. Pt found with R weakness, R facial droop and left gaze preference. Pt found to have subcortical nontraumatic ICH (L basal ganglia) on CT scan.    PT Comments    Patient progressing slowly and daughter reports was more distracted today.  Noted increased assist for squat pivot to chair and unable to stand up fully with assist nor with lift equipment.  Patient initiated better without lift equipment to try for sit to stand.  Continue to feel he is appropriate for CIR.  PT to follow acutely.   Follow Up Recommendations  CIR;Supervision/Assistance - 24 hour     Equipment Recommendations  Wheelchair (measurements PT);Wheelchair cushion (measurements PT);Hospital bed;Other (comment)    Recommendations for Other Services       Precautions / Restrictions Precautions Precautions: Fall Restrictions Weight Bearing Restrictions: No    Mobility  Bed Mobility Overal bed mobility: Needs Assistance Bed Mobility: Rolling;Sidelying to Sit Rolling: Mod assist;+2 for physical assistance Sidelying to sit: +2 for physical assistance;Max assist       General bed mobility comments: cues and assist to initiate pt reaching for rail with L UE, assist for legs off bed and trunk upright  Transfers Overall transfer level: Needs assistance Equipment used: None Transfers: Sit to/from [employer] Transfers Sit to Stand: Max assist;+2 physical assistance   Squat pivot transfers: Max assist;+2 physical assistance     General transfer comment: unable to get fully upright so needing to squat pivot to chair; attempted sit to stand to stedy but pt not using and kept trying to push from armrest of chair so removed stedy and  attempted from chair with still unable to come upright, but improved effort/initiation.  Ambulation/Gait                 Stairs             Wheelchair Mobility    Modified Rankin (Stroke Patients Only) Modified Rankin (Stroke Patients Only) Pre-Morbid Rankin Score: No symptoms Modified Rankin: Severe disability     Balance Overall balance assessment: Needs assistance Sitting-balance support: Bilateral upper extremity supported;Feet supported Sitting balance-Leahy Scale: Poor Sitting balance - Comments: mod A progressing to S at times at EOB   Standing balance support: Single extremity supported Standing balance-Leahy Scale: Zero Standing balance comment: unable to get all the way upright today                            Cognition Arousal/Alertness: Awake/alert Behavior During Therapy: Flat affect Overall Cognitive Status: Impaired/Different from baseline Area of Impairment: Attention;Memory;Following commands;Safety/judgement;Awareness;Problem solving                   Current Attention Level: Focused Memory: Decreased short-term memory Following Commands: Follows one step commands inconsistently;Follows one step commands with increased time Safety/Judgement: Decreased awareness of safety;Decreased awareness of deficits   Problem Solving: Slow processing;Difficulty sequencing;Requires verbal cues General Comments: not following commands initially, able to complete L LE therex with visual cues and gestures      Exercises General Exercises - Lower Extremity Ankle Circles/Pumps: AAROM;5 reps;Supine;Both Heel Slides: AROM;PROM;5 reps;Left;Both;Supine    General Comments General comments (skin integrity, edema, etc.): daughter present, VSS, noted weeping R mid forearm  clear drainage, asked for new call bell from nursing secretary      Pertinent Vitals/Pain Pain Assessment: Faces Faces Pain Scale: No hurt    Home Living                       Prior Function            PT Goals (current goals can now be found in the care plan section) Progress towards PT goals: Progressing toward goals(slowly)    Frequency    Min 4X/week      PT Plan Current plan remains appropriate    Co-evaluation              AM-PAC PT "6 Clicks" Mobility   Outcome Measure  Help needed turning from your back to your side while in a flat bed without using bedrails?: A Lot Help needed moving from lying on your back to sitting on the side of a flat bed without using bedrails?: A Lot Help needed moving to and from a bed to a chair (including a wheelchair)?: Total Help needed standing up from a chair using your arms (e.g., wheelchair or bedside chair)?: Total Help needed to walk in hospital room?: Total Help needed climbing 3-5 steps with a railing? : Total 6 Click Score: 8    End of Session Equipment Utilized During Treatment: Gait belt Activity Tolerance: Patient tolerated treatment well Patient left: in chair;with call bell/phone within reach;with chair alarm set;with family/visitor present Nurse Communication: Need for lift equipment;Mobility status PT Visit Diagnosis: Other abnormalities of gait and mobility (R26.89);Muscle weakness (generalized) (M62.81);Other symptoms and signs involving the nervous system (R29.898);Hemiplegia and hemiparesis Hemiplegia - Right/Left: Right Hemiplegia - caused by: Nontraumatic intracerebral hemorrhage     Time: 1500-1536 PT Time Calculation (min) (ACUTE ONLY): 36 min  Charges:  $Therapeutic Activity: 23-37 mins                     Magda Kiel, [place] [facility] [phone] 09/06/2019    [name] 09/06/2019, 5:21 PM

## 2019-09-06 NOTE — Progress Notes (Signed)
STROKE TEAM PROGRESS NOTE   INTERVAL HISTORY Daughter at bedside. Pt lying in bed, awake alert but lethargic, tired after working with PT/OT. Still has left sided weakness and severe dysarthria vs. Aphasia, seems slightly worsening from yesterday likely due to cerebral edema on the left vs. Lethargy/tiredness.    Vitals:   09/05/19 2328 09/06/19 0335 09/06/19 0913 09/06/19 1236  BP: (!) 147/84 (!) 148/84 (!) 146/63 (!) 129/59  Pulse: 84 82 66 61  Resp: 18 17  (!) 22  Temp: 99 F (37.2 C) 98.6 F (37 C) 97.8 F (36.6 C) 98 F (36.7 C)  TempSrc: Oral Oral Oral Oral  SpO2: 97% 96% 98% 99%  Weight:      Height:        CBC:  Recent Labs  Lab 09/04/19 1300 09/04/19 1305 09/05/19 0812 09/06/19 0520  WBC 8.4   < > 6.7 6.4  NEUTROABS 6.6  --   --   --   HGB 15.8   < > 14.9 15.9  HCT 47.7   < > 44.5 47.6  MCV 93.5   < > 91.0 95.2  PLT 85*   < > 89* 87*   < > = values in this interval not displayed.    Basic Metabolic Panel:  Recent Labs  Lab 09/05/19 0812 09/06/19 0520  NA 139 137  K 4.1 4.5  CL 107 107  CO2 22 17*  GLUCOSE 92 85  BUN 18 17  CREATININE 1.34* 1.31*  CALCIUM 8.8* 8.4*   Lipid Panel:     Component Value Date/Time   CHOL 192 09/04/2019 1300   TRIG 46 09/04/2019 1300   HDL 45 09/04/2019 1300   CHOLHDL 4.3 09/04/2019 1300   VLDL 9 09/04/2019 1300   LDLCALC 138 (H) 09/04/2019 1300   HgbA1c:  Lab Results  Component Value Date   HGBA1C 5.9 (H) 09/04/2019   Urine Drug Screen: No results found for: LABOPIA, COCAINSCRNUR, LABBENZ, AMPHETMU, THCU, LABBARB  Alcohol Level No results found for: ETH  IMAGING past 24 hours DG Swallowing Func-Speech Pathology  Result Date: 09/06/2019 Objective Swallowing Evaluation: Type of Study: MBS-Modified Barium Swallow Study  Patient Details Name: CAMERINO LAZER MRN: [id number] Date of Birth: 1923/06/24 Today's Date: 09/06/2019 Time: SLP Start Time (ACUTE ONLY): 1055 -SLP Stop Time (ACUTE ONLY): 1115 SLP Time Calculation  (min) (ACUTE ONLY): 20 min Past Medical History: Past Medical History: Diagnosis Date . CAD (coronary artery disease)   intolerance of statins . COPD (chronic obstructive pulmonary disease) ([place])  . Current use of long term anticoagulation  . GERD (gastroesophageal reflux disease)  . Hyperlipidemia  . Hypertension  . Impaired glucose tolerance 02/14/2011 . LBP (low back pain)  . OA (osteoarthritis)  . S/P AVR (aortic valve replacement) 2009  bonine valve Past Surgical History: Past Surgical History: Procedure Laterality Date . AORTIC VALVE REPLACEMENT  01/15/1999  bovine . CARDIAC CATHETERIZATION  10/05/1998  Recommend CABG revascularizaition . CARDIAC CATHETERIZATION  06/11/1999  Continue medical therapy . CARDIAC CATHETERIZATION  01/08/2007  Patent grafts, recommend medical therapy . CARDIOVASCULAR STRESS TEST  04/22/2010  Small lateral defect which is partially reversible, no ischemic EKG changes were noted. Marland Kitchen CAROTID DOPPLER  04/22/2010  Bilateral ICAs-no evidence of diametere reduction, significant tortuosity, or any other vascular abnormality. . CORONARY ARTERY BYPASS GRAFT  10/09/1998  LIMA to LAD, SVG to diag, SVG sequential to 2 marginals, SVG sequential to PDA & PLA. CE#25 porcine AVR. Marland Kitchen TRANSTHORACIC ECHOCARDIOGRAM  05/14/2012  EF 50-55%,  bioprosthesis of the aortic valve with well preserved LV function, moderate concentric LVGH HPI: 86 y.o. male with past medical history of status post AVR, impaired glucose tolerance, hypertension, hyperlipidemia, COPD and CAD admitted with acute intra-axial hemorrhage in left lentiform nuclei.   Subjective: alert Assessment / Plan / Recommendation CHL IP CLINICAL IMPRESSIONS 09/06/2019 Clinical Impression Pt presents with a moderate dysphagia marked by significant oral holding, premature loss of bolus into the pharynx, and delayed onset of pharyngeal swallow.  Thicker viscosities tended to fill in pharyngeal spaces, requiring tactile/verbal cues for pt to initiate the swallow.   There was trace aspiration of thin liquids, accompanied by a cough response.  Laryngeal vestibule closure was adequate but delayed.  There was decreased propulsive force of the tongue/pharynx, contributing to residue remaining post-swallow. Pt's son and dtr were present for exam.  We talked about the primary obstacle being related to decreased initiation and concerns that pt may not eat enough to meet needs.  For today, recommend starting dysphagia 1, honey-thick liquids to minimize aspiration risk. Crush meds.  Pt will need full supervision to assist with self-feeding and prompt him to swallow. Family, RN agree with plan.  SLP Visit Diagnosis Dysphagia, oropharyngeal phase (R13.12) Attention and concentration deficit following -- Frontal lobe and executive function deficit following -- Impact on safety and function Mild aspiration risk   CHL IP TREATMENT RECOMMENDATION 09/06/2019 Treatment Recommendations Therapy as outlined in treatment plan below   Prognosis 09/06/2019 Prognosis for Safe Diet Advancement Good Barriers to Reach Goals -- Barriers/Prognosis Comment -- CHL IP DIET RECOMMENDATION 09/06/2019 SLP Diet Recommendations Dysphagia 1 (Puree) solids;Honey thick liquids Liquid Administration via Cup;Spoon Medication Administration Crushed with puree Compensations Minimize environmental distractions Postural Changes --   CHL IP OTHER RECOMMENDATIONS 09/06/2019 Recommended Consults -- Oral Care Recommendations Oral care BID Other Recommendations Order thickener from pharmacy   CHL IP FOLLOW UP RECOMMENDATIONS 09/06/2019 Follow up Recommendations Inpatient Rehab   CHL IP FREQUENCY AND DURATION 09/06/2019 Speech Therapy Frequency (ACUTE ONLY) min 3x week Treatment Duration 2 weeks      CHL IP ORAL PHASE 09/06/2019 Oral Phase Impaired Oral - Pudding Teaspoon -- Oral - Pudding Cup -- Oral - Honey Teaspoon -- Oral - Honey Cup Right anterior bolus loss;Weak lingual manipulation;Reduced posterior propulsion;Holding of  bolus;Delayed oral transit;Decreased bolus cohesion Oral - Nectar Teaspoon -- Oral - Nectar Cup -- Oral - Nectar Straw -- Oral - Thin Teaspoon Right anterior bolus loss;Weak lingual manipulation;Reduced posterior propulsion;Holding of bolus;Delayed oral transit;Decreased bolus cohesion Oral - Thin Cup -- Oral - Thin Straw -- Oral - Puree Right anterior bolus loss;Weak lingual manipulation;Reduced posterior propulsion;Holding of bolus;Delayed oral transit;Decreased bolus cohesion Oral - Mech Soft -- Oral - Regular -- Oral - Multi-Consistency -- Oral - Pill -- Oral Phase - Comment --  CHL IP PHARYNGEAL PHASE 09/06/2019 Pharyngeal Phase Impaired Pharyngeal- Pudding Teaspoon -- Pharyngeal -- Pharyngeal- Pudding Cup -- Pharyngeal -- Pharyngeal- Honey Teaspoon -- Pharyngeal -- Pharyngeal- Honey Cup Delayed swallow initiation-pyriform sinuses;Reduced pharyngeal peristalsis;Reduced tongue base retraction;Pharyngeal residue - valleculae;Pharyngeal residue - pyriform Pharyngeal -- Pharyngeal- Nectar Teaspoon -- Pharyngeal -- Pharyngeal- Nectar Cup -- Pharyngeal -- Pharyngeal- Nectar Straw -- Pharyngeal -- Pharyngeal- Thin Teaspoon Delayed swallow initiation-pyriform sinuses;Reduced pharyngeal peristalsis;Reduced airway/laryngeal closure;Penetration/Aspiration before swallow;Trace aspiration;Pharyngeal residue - pyriform;Pharyngeal residue - valleculae Pharyngeal Material enters airway, passes BELOW cords and not ejected out despite cough attempt by patient Pharyngeal- Thin Cup -- Pharyngeal -- Pharyngeal- Thin Straw -- Pharyngeal -- Pharyngeal- Puree Delayed swallow initiation-pyriform sinuses;Reduced pharyngeal  peristalsis;Pharyngeal residue - valleculae;Pharyngeal residue - pyriform;Reduced tongue base retraction Pharyngeal -- Pharyngeal- Mechanical Soft -- Pharyngeal -- Pharyngeal- Regular -- Pharyngeal -- Pharyngeal- Multi-consistency -- Pharyngeal -- Pharyngeal- Pill -- Pharyngeal -- Pharyngeal Comment --  No flowsheet  data found. [name] 09/06/2019, 12:00 PM               PHYSICAL EXAM  Temp:  [97.8 F (36.6 C)-99.3 F (37.4 C)] 98 F (36.7 C) (04/23 1236) Pulse Rate:  [39-84] 61 (04/23 1236) Resp:  [13-22] 22 (04/23 1236) BP: (124-153)/(57-84) 129/59 (04/23 1236) SpO2:  [94 %-99 %] 99 % (04/23 1236)  General - Well nourished, well developed, mildly lethargic.  Ophthalmologic - fundi not visualized due to noncooperation.  Cardiovascular - Regular rhythm and rate.  Neuro - awake alert, mildly lethargic, severe dysarthria vs. Aphasia, not able to be understood about his answers for orientation questions. Follows central commands with eye open/close, tongue protrusion but not following peripheral commands on the left. Not able to repeat or name. PERRL, blinking to visual threat bilaterally. Right facial droop. Tongue midline. LUE 4/5 spontaneous movement, LLE 3-/5 with pain stimulation. However, RUE flaccid not move even with pain. RLE slight withdraw to pain. Sensation, coordination not cooperative and gait not tested.  ASSESSMENT/PLAN Mr. KENSHIN CARLBERG is a 86 y.o. male with history of s/p AVR, impaired glucose tolerance, hypertension, hyperlipidemia, COPD and CAD presenting with dysarthria, aphaisa, R sided weakness, R facial droop, L gaze preference.   Stroke:   L BG ICH - small vessel disease vs. thrombocytopenia   Code Stroke CT head L lentiform nucleus ICH. Small vessel disease.   CT CS no traumatic injury. Spine degeneration  CTA head & neck L basal ganglia ICH. No LVO. Min atherosclerosis. Prior CABG. Aortic atherosclerosis.   CT head unchanged L basal ganglia ICH. Mild small vessel disease.  MRI  Stable L basal ganglia ICH. No underlying lesion. Old B basal ganglia infarcts. Small vessel disease.  2D Echo EF 60-65%, but pulmonic valve vegetation found  LDL 138  HgbA1c 5.9  SCDs for VTE prophylaxis  aspirin 81 mg daily prior to admission, now on No antithrombotic  given hemorrhage and thrombocytopenia   Therapy recommendations:  CIR  Disposition:  pending   pulmonic valve vegetation vs. Mass - likely chronic   Symptomatic   Showed on TTE this admission  02/23/18 Dr. Claiborne Billings note "He does have a mass on the ventricular surface of his pulmonic valve.  I reviewed this with Dr. Sallyanne Kuster and also today in the office reviewed this with Dr. Oval Linsey.  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  Patient is asymptomatic.  Retrospectively, most likely this was also present on the 2017 echo."  07/30/19 Dr. 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  This was not mentioned on his most recent echo assessment in August 2020."  Feel like the pulmonic valve vegetation likely to be chronic and asymptomatic - continue to follow up with Dr. Claiborne Billings - no antibiotics needed at this time.  Hypertension  BP as high as 151/79  Home meds:  atenolol 25, losartan 25  No IV BP meds needed  Stable . SBP goal < 140 . Long-term BP goal normotensive  Hyperlipidemia  Home meds:  zetia 10  Intolerant to statin  LDL 138, goal < 70  Resume zetia at discharge  Thrombocytopenia, stable  Platelet 85->89->87  Close monitor  No platelet transfusion needed at this time  Dysphagia . Secondary to stroke . Speech on board . Cleared for D1 honey thick . On IVF   Other Stroke Risk Factors  Advanced age  Former Cigarette smoker, quit 21 yrs ago  Former smokeless tobacco user  Obesity, Body mass index is 30.01 kg/m., recommend weight loss, diet and exercise as appropriate   Coronary artery disease  S/p AV replacement  Other Active Problems  COPD   CKD Cre 1.43->1.2->1.34->1.31  Hospital day # 2  Neurology will sign off. Please call with questions. Pt will follow up with stroke clinic NP at [facility]  in about 4 weeks. Thanks for the consult.   Rosalin Hawking, MD PhD Stroke Neurology 09/06/2019 1:56 PM   To contact Stroke Continuity provider, please refer to [email or web address] After hours, contact General Neurology

## 2019-09-06 NOTE — Progress Notes (Signed)
Inpatient Rehabilitation Admissions Coordinator  I met with patient at bedside with his daughter. We discussed goals an expectations of a possible inpt rehab admit. She prefers CIR rather than SNF. I will follow up on Monday for his progress and bed availability at CIR.  Danne Baxter, RN, MSN Rehab Admissions Coordinator [phone] 09/06/2019 3:40 PM

## 2019-09-06 NOTE — Progress Notes (Addendum)
David [name]  [id number] DOB: [birth year] DOA: 09/04/2019 PCP: Cassandria Anger, MD    Brief Narrative:  86 year old with a history of aortic valve replacement, impaired glucose tolerance, HTN, HLD, COPD, and CAD who presented to the hospital with dysarthria and aphasia accompanied by right-sided weakness right facial droop and a left gaze preference.  He was admitted to the stroke service and underwent extensive evaluation which ultimately revealed a left basal ganglia intracranial hemorrhage felt to be related to small vessel disease versus thrombocytopenia.  Antimicrobials:  None  Subjective: Alert and communicative though dysarthria prohibits effective communication.  Denies discomfort.  No respiratory distress.  I spoke with the patient's sister at father at bedside at length.  They reported to me that patient has a history of a "spot" on his pulmonic valve and that this has been followed for at least a couple of years by Dr. Claiborne Billings his cardiologist.  A review of his records supports this.  Is my special closet or absolutely the best climb right down the street now and after [employer] in a put me in this medical director position will see if it last thank you you have done for the day 11-year-old 26-year-old 0 big party plan get a failure to do so  Assessment & Plan:  Left basal ganglia (lentiform nucleus) intracranial hemorrhage Felt to be related to small vessel disease versus thrombocytopenia -CT cervical spine without traumatic injury -CTa head and neck confirmed left basal ganglia ICH with no large vessel obstruction -MRI revealed no underlying lesions but did suggest old bilateral basal ganglia infarcts and small vessel disease -TTE noted an EF of 60-65% but raised the question of a pulmonic valve vegetation  Pulmonic valve vegetation Noted on TTE -TEE was being considered but at this time it is not felt likely it would impact his acute treatment -no fever or other clinical  evidence to suggest SBE -it appears that this is not a new issue but actually chronically has been followed by his cardiologist and I do not feel that further evaluation is presently indicated  Status post bioprosthetic aortic valve replacement No clinical evidence of valvular dysfunction at this time -followed by routine echo as per Dr. Ellouise Newer  HTN Goal is to keep SBP less than 140 -currently well controlled  HLD LDL noted to be 138 during this admission -intolerant to statin -resume Zetia at discharge  Thrombocytopenia Following platelet count  Dysphagia Due to hemorrhagic stroke -SLP following -diet able to be advanced somewhat by SLP today therefore hopefully patient will not require a core track  DVT prophylaxis: SCDs Code Status: FULL CODE Family Communication:  Disposition Plan:   Consultants:  none  Objective: Blood pressure (!) 146/63, pulse 66, temperature 97.8 F (36.6 C), temperature source Oral, resp. rate 17, height 5\' 11"  (1.803 m), weight 97.6 kg, SpO2 98 %.  Intake/Output Summary (Last 24 hours) at 09/06/2019 0935 Last data filed at 09/05/2019 1800 Gross per 24 hour  Intake 651.79 ml  Output 250 ml  Net 401.79 ml   Filed Weights   09/04/19 1341 09/04/19 1600  Weight: 97.7 kg 97.6 kg    Examination: General: No acute respiratory distress Lungs: Clear to auscultation bilaterally without wheezes or crackles Cardiovascular: Regular rate and rhythm without murmur gallop or rub normal S1 and S2 Abdomen: Nontender, nondistended, soft, bowel sounds positive, no rebound, no ascites, no appreciable mass Extremities: No significant cyanosis, clubbing, or edema bilateral lower extremities  CBC: Recent Labs  Lab 09/04/19 1300 09/04/19 1300 09/04/19 1305 09/05/19 0812 09/06/19 0520  WBC 8.4  --   --  6.7 6.4  NEUTROABS 6.6  --   --   --   --   HGB 15.8   < > 16.3 14.9 15.9  HCT 47.7   < > 48.0 44.5 47.6  MCV 93.5  --   --  91.0 95.2  PLT 85*  --   --   89* 87*   < > = values in this interval not displayed.   Basic Metabolic Panel: Recent Labs  Lab 09/04/19 1300 09/04/19 1300 09/04/19 1305 09/05/19 0812 09/06/19 0520  NA 139   < > 136 139 137  K 5.0   < > 6.9* 4.1 4.5  CL 106   < > 106 107 107  CO2 23  --   --  22 17*  GLUCOSE 143*   < > 137* 92 85  BUN 22   < > 33* 18 17  CREATININE 1.43*   < > 1.20 1.34* 1.31*  CALCIUM 9.0  --   --  8.8* 8.4*   < > = values in this interval not displayed.   GFR: Estimated Creatinine Clearance: 41.1 mL/min (A) (by C-G formula based on SCr of 1.31 mg/dL (H)).  Liver Function Tests: Recent Labs  Lab 09/04/19 1300 09/05/19 0812  AST 35 21  ALT 19 15  ALKPHOS 46 43  BILITOT 1.3* 1.4*  PROT 6.9 6.5  ALBUMIN 3.8 3.3*    Coagulation Profile: Recent Labs  Lab 09/04/19 1300 09/06/19 0719  INR 1.1 1.2    HbA1C: Hgb A1c MFr Bld  Date/Time Value Ref Range Status  09/04/2019 03:46 PM 5.9 (H) 4.8 - 5.6 % Final    Comment:    (NOTE) Pre diabetes:          5.7%-6.4% Diabetes:              >6.4% Glycemic control for   <7.0% adults with diabetes   06/06/2016 08:14 AM 5.8 4.6 - 6.5 % Final    Comment:    Glycemic Control Guidelines for People with Diabetes:Non Diabetic:  <6%Goal of Therapy: <7%Additional Action Suggested:  >8%     CBG: Recent Labs  Lab 09/05/19 1520 09/05/19 2220 09/06/19 0022 09/06/19 0416 09/06/19 0756  GLUCAP 85 88 85 74 77    Recent Results (from the past 240 hour(s))  Respiratory Panel by RT PCR (Flu A&B, Covid) - Nasopharyngeal Swab     Status: None   Collection Time: 09/04/19  1:36 PM   Specimen: Nasopharyngeal Swab  Result Value Ref Range Status   SARS Coronavirus 2 by RT PCR NEGATIVE NEGATIVE Final    Comment: (NOTE) SARS-CoV-2 target nucleic acids are NOT DETECTED. The SARS-CoV-2 RNA is generally detectable in upper respiratoy specimens during the acute phase of infection. The lowest concentration of SARS-CoV-2 viral copies this assay can  detect is 131 copies/mL. A negative result does not preclude SARS-Cov-2 infection and should not be used as the sole basis for treatment or other patient management decisions. A negative result may occur with  improper specimen collection/handling, submission of specimen other than nasopharyngeal swab, presence of viral mutation(s) within the areas targeted by this assay, and inadequate number of viral copies (<131 copies/mL). A negative result must be combined with clinical observations, patient history, and epidemiological information. The expected result is Negative. Fact Sheet for Patients:  PinkCheek.be Fact Sheet for Healthcare Providers:  GravelBags.it This test  is not yet ap proved or cleared by the [place] and  has been authorized for detection and/or diagnosis of SARS-CoV-2 by FDA under an Emergency Use Authorization (EUA). This EUA will remain  in effect (meaning this test can be used) for the duration of the COVID-19 declaration under Section 564(b)(1) of the Act, 21 U.S.C. section 360bbb-3(b)(1), unless the authorization is terminated or revoked sooner.    Influenza A by PCR NEGATIVE NEGATIVE Final   Influenza B by PCR NEGATIVE NEGATIVE Final    Comment: (NOTE) The Xpert Xpress SARS-CoV-2/FLU/RSV assay is intended as an aid in  the diagnosis of influenza from Nasopharyngeal swab specimens and  should not be used as a sole basis for treatment. Nasal washings and  aspirates are unacceptable for Xpert Xpress SARS-CoV-2/FLU/RSV  testing. Fact Sheet for Patients: PinkCheek.be Fact Sheet for Healthcare Providers: GravelBags.it This test is not yet approved or cleared by the [place] FDA and  has been authorized for detection and/or diagnosis of SARS-CoV-2 by  FDA under an Emergency Use Authorization (EUA). This EUA will remain  in effect (meaning  this test can be used) for the duration of the  Covid-19 declaration under Section 564(b)(1) of the Act, 21  U.S.C. section 360bbb-3(b)(1), unless the authorization is  terminated or revoked. Performed at [facility], [place] [address] [place], [place] [address]   MRSA PCR Screening     Status: None   Collection Time: 09/04/19  3:46 PM   Specimen: Nasopharyngeal  Result Value Ref Range Status   MRSA by PCR NEGATIVE NEGATIVE Final    Comment:        The GeneXpert MRSA Assay (FDA approved for NASAL specimens only), is one component of a comprehensive MRSA colonization surveillance program. It is not intended to diagnose MRSA infection nor to guide or monitor treatment for MRSA infections. Performed at [facility], [place] [address], [place], [place] [address]      Scheduled Meds: .  stroke: mapping our early stages of recovery book   Does not apply Once  . chlorhexidine  15 mL Mouth Rinse BID  . Chlorhexidine Gluconate Cloth  6 each Topical Daily  . insulin aspart  2-6 Units Subcutaneous Q4H  . mouth rinse  15 mL Mouth Rinse q12n4p  . pantoprazole (PROTONIX) IV  40 mg Intravenous QHS  . senna-docusate  1 tablet Oral BID  . sodium chloride flush  3 mL Intravenous Once   Continuous Infusions: . sodium chloride 75 mL/hr at 09/06/19 0136     LOS: 2 days   Cherene Altes, MD [facility] Office  [phone] Pager - Text Page per [place]  If 7PM-7AM, please contact night-coverage per Amion 09/06/2019, 9:35 AM

## 2019-09-06 NOTE — Progress Notes (Signed)
Modified Barium Swallow Progress Note  Patient Details  Name: David [name] MRN: [id number] Date of Birth: [birth year]  Today's Date: 09/06/2019  Modified Barium Swallow completed.  Full report located under Chart Review in the Imaging Section.  Brief recommendations include the following:  Clinical Impression  Pt presents with a moderate dysphagia marked by significant oral holding, premature loss of bolus into the pharynx, and delayed onset of pharyngeal swallow.  Thicker viscosities tended to fill in pharyngeal spaces, requiring tactile/verbal cues for pt to initiate the swallow.  There was trace aspiration of thin liquids, accompanied by a cough response.  Laryngeal vestibule closure was adequate but delayed.  There was decreased propulsive force of the tongue/pharynx, contributing to residue remaining post-swallow. Pt's son and dtr were present for exam.  We talked about the primary obstacle being related to decreased initiation and concerns that pt may not eat enough to meet needs.  For today, recommend starting dysphagia 1, honey-thick liquids to minimize aspiration risk. Crush meds.  Pt will need full supervision to assist with self-feeding and prompt him to swallow. Family, RN agree with plan.    Swallow Evaluation Recommendations       SLP Diet Recommendations: Dysphagia 1 (Puree) solids;Honey thick liquids   Liquid Administration via: Cup;Spoon   Medication Administration: Crushed with puree   Supervision: Full assist for feeding;Staff to assist with self feeding   Compensations: Minimize environmental distractions       Oral Care Recommendations: Oral care BID   Other Recommendations: Order thickener from [facility]. Tivis Ringer, [place] Office number [phone] Pager [phone]   [name] 09/06/2019,11:59 AM

## 2019-09-07 DIAGNOSIS — I379 Nonrheumatic pulmonary valve disorder, unspecified: Secondary | ICD-10-CM

## 2019-09-07 LAB — BASIC METABOLIC PANEL
Anion gap: 9 (ref 5–15)
BUN: 27 mg/dL — ABNORMAL HIGH (ref 8–23)
CO2: 22 mmol/L (ref 22–32)
Calcium: 8.5 mg/dL — ABNORMAL LOW (ref 8.9–10.3)
Chloride: 106 mmol/L (ref 98–111)
Creatinine, Ser: 1.51 mg/dL — ABNORMAL HIGH (ref 0.61–1.24)
GFR calc Af Amer: 45 mL/min — ABNORMAL LOW (ref >60)
GFR calc non Af Amer: 39 mL/min — ABNORMAL LOW (ref >60)
Glucose, Bld: 120 mg/dL — ABNORMAL HIGH (ref 70–99)
Potassium: 3.8 mmol/L (ref 3.5–5.1)
Sodium: 137 mmol/L (ref 135–145)

## 2019-09-07 LAB — CBC
HCT: 42.7 % (ref 39.0–52.0)
Hemoglobin: 14.2 g/dL (ref 13.0–17.0)
MCH: 30.1 pg (ref 26.0–34.0)
MCHC: 33.3 g/dL (ref 30.0–36.0)
MCV: 90.7 fL (ref 80.0–100.0)
Platelets: 78 10*3/uL — ABNORMAL LOW (ref 150–400)
RBC: 4.71 MIL/uL (ref 4.22–5.81)
RDW: 13.2 % (ref 11.5–15.5)
WBC: 7.8 10*3/uL (ref 4.0–10.5)
nRBC: 0 % (ref 0.0–0.2)

## 2019-09-07 LAB — GLUCOSE, CAPILLARY
Glucose-Capillary: 104 mg/dL — ABNORMAL HIGH (ref 70–99)
Glucose-Capillary: 109 mg/dL — ABNORMAL HIGH (ref 70–99)
Glucose-Capillary: 110 mg/dL — ABNORMAL HIGH (ref 70–99)
Glucose-Capillary: 103 mg/dL — ABNORMAL HIGH (ref 70–99)

## 2019-09-07 NOTE — Progress Notes (Signed)
Physical Therapy Treatment Patient Details Name: David [name] MRN: [id number] DOB: 16-Dec-1923 Today's Date: 09/07/2019    History of Present Illness 86 y.o. male with past medical history of status post AVR, impaired glucose tolerance, hypertension, hyperlipidemia, COPD and CAD. Pt found with R weakness, R facial droop and left gaze preference. Pt found to have subcortical nontraumatic ICH (L basal ganglia) on CT scan.    PT Comments    Patient progressing albeit slowly towards functional goals. Focusing on bed mobility and seated balance today, progressed to single UE supported balance at EOB for multiple periods of 30 seconds. Developing awareness of posterior lean. Attempting to identify family in room and holding head upright. Initiating more LLE movement but still requires assist to move RLE into/out of bed. Patient will continue to benefit from skilled physical therapy services to further improve independence with functional mobility.    Follow Up Recommendations  CIR;Supervision/Assistance - 24 hour     Equipment Recommendations  Wheelchair (measurements PT);Wheelchair cushion (measurements PT);Hospital bed;Other (comment)    Recommendations for Other Services       Precautions / Restrictions Precautions Precautions: Fall Restrictions Weight Bearing Restrictions: No    Mobility  Bed Mobility Overal bed mobility: Needs Assistance Bed Mobility: Rolling;Sidelying to Sit;Sit to Sidelying Rolling: Mod assist;+2 for physical assistance Sidelying to sit: +2 for physical assistance;Max assist     Sit to sidelying: Mod assist;+2 for safety/equipment General bed mobility comments: Mod assist for RLE and trunk to rise to EOB from left sidelying position. Initially leaning posteriorly put progressed well to independent seated position for appox 30 sec intervals. Mod assist for RLE back into bed, slight assist for control onto left side. Pt able assist slightly with push from LLE in  scooting to [facility].  Transfers                    Ambulation/Gait                 Stairs             Wheelchair Mobility    Modified Rankin (Stroke Patients Only) Modified Rankin (Stroke Patients Only) Pre-Morbid Rankin Score: No symptoms Modified Rankin: Severe disability     Balance Overall balance assessment: Needs assistance Sitting-balance support: Feet supported;Single extremity supported Sitting balance-Leahy Scale: Poor Sitting balance - Comments: Progressed to supervision with single UE support on bed tolerated approx 10 min EOB working on weight sshift and lifting head to identify family in room.                                    Cognition Arousal/Alertness: Awake/alert Behavior During Therapy: Flat affect Overall Cognitive Status: Impaired/Different from baseline Area of Impairment: Attention;Memory;Following commands;Safety/judgement;Problem solving                   Current Attention Level: Focused Memory: Decreased short-term memory Following Commands: Follows one step commands inconsistently;Follows one step commands with increased time Safety/Judgement: Decreased awareness of safety;Decreased awareness of deficits   Problem Solving: Slow processing;Difficulty sequencing;Requires verbal cues General Comments: Difficulty expressing, says "yep" for most questions      Exercises      General Comments        Pertinent Vitals/Pain Pain Assessment: Faces Faces Pain Scale: Hurts a little bit Pain Descriptors / Indicators: Grimacing Pain Intervention(s): Monitored during session;Repositioned    Home Living  Prior Function            PT Goals (current goals can now be found in the care plan section) Acute Rehab PT Goals PT Goal Formulation: With patient/family Time For Goal Achievement: 09/19/19 Potential to Achieve Goals: Good Progress towards PT goals: Progressing toward  goals    Frequency    Min 4X/week      PT Plan Current plan remains appropriate    Co-evaluation              AM-PAC PT "6 Clicks" Mobility   Outcome Measure  Help needed turning from your back to your side while in a flat bed without using bedrails?: A Lot Help needed moving from lying on your back to sitting on the side of a flat bed without using bedrails?: A Lot Help needed moving to and from a bed to a chair (including a wheelchair)?: Total Help needed standing up from a chair using your arms (e.g., wheelchair or bedside chair)?: Total Help needed to walk in hospital room?: Total Help needed climbing 3-5 steps with a railing? : Total 6 Click Score: 8    End of Session   Activity Tolerance: Other (comment)(Treatment limited 2/2 difficulty following commands) Patient left: in bed;with call bell/phone within reach;with bed alarm set;with family/visitor present;with SCD's reapplied   PT Visit Diagnosis: Other abnormalities of gait and mobility (R26.89);Muscle weakness (generalized) (M62.81);Other symptoms and signs involving the nervous system (R29.898);Hemiplegia and hemiparesis Hemiplegia - Right/Left: Right Hemiplegia - dominant/non-dominant: Dominant Hemiplegia - caused by: Nontraumatic intracerebral hemorrhage     Time: [id number] PT Time Calculation (min) (ACUTE ONLY): 20 min  Charges:  $Therapeutic Activity: 8-22 mins                     Elayne Snare, PT    David [name] 09/07/2019, 5:48 PM

## 2019-09-07 NOTE — Progress Notes (Signed)
David [name]  [id number] DOB: 1924/04/21 DOA: 09/04/2019 PCP: Cassandria Anger, MD    Brief Narrative:  86 year old with a history of aortic valve replacement, impaired glucose tolerance, HTN, HLD, COPD, and CAD who presented to the hospital with dysarthria and aphasia accompanied by right-sided weakness right facial droop and a left gaze preference.  He was admitted to the stroke service and underwent extensive evaluation which ultimately revealed a left basal ganglia intracranial hemorrhage felt to be related to small vessel disease versus thrombocytopenia.  Antimicrobials:  None  Subjective: No significant clinical change on exam.  Appears comfortable.  Denies chest pain or shortness of breath.  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 Ellouise Newer  HTN Goal is to keep SBP less than 140 -currently well controlled  HLD LDL noted to be 138 during this admission -intolerant to statin -resume Zetia at  discharge  Thrombocytopenia Following platelet count  Dysphagia Due to hemorrhagic stroke -SLP following -diet able to be advanced somewhat by SLP today therefore hopefully patient will not require a core track  DVT prophylaxis: SCDs Code Status: FULL CODE Family Communication: Spoke with daughter at bedside Disposition Plan: Awaiting placement in CIR -medically ready for disposition when bed available  Consultants:  none  Objective: Blood pressure (!) 118/57, pulse 62, temperature 98.3 F (36.8 C), temperature source Oral, resp. rate 16, height 5\' 11"  (1.803 m), weight 97.6 kg, SpO2 100 %.  Intake/Output Summary (Last 24 hours) at 09/07/2019 0914 Last data filed at 09/06/2019 1922 Gross per 24 hour  Intake 250 ml  Output 600 ml  Net -350 ml   Filed Weights   09/04/19 1341 09/04/19 1600  Weight: 97.7 kg 97.6 kg    Examination: General: No acute respiratory distress Lungs: Clear to auscultation bilaterally  Cardiovascular: RRR without murmur or rub Abdomen: NT/ND, soft, BS positive Extremities: No significant cyanosis, clubbing, or edema bilateral lower extremities  CBC: Recent Labs  Lab 09/04/19 1300 09/04/19 1305 09/05/19 0812 09/06/19 0520 09/07/19 0438  WBC 8.4   < > 6.7 6.4 7.8  NEUTROABS 6.6  --   --   --   --   HGB 15.8   < > 14.9 15.9 14.2  HCT 47.7   < > 44.5 47.6 42.7  MCV 93.5   < > 91.0 95.2 90.7  PLT 85*   < > 89* 87* 78*   < > = values in this interval not displayed.   Basic Metabolic Panel: Recent Labs  Lab 09/05/19 0812 09/06/19 0520 09/07/19 0438  NA 139 137  137  K 4.1 4.5 3.8  CL 107 107 106  CO2 22 17* 22  GLUCOSE 92 85 120*  BUN 18 17 27*  CREATININE 1.34* 1.31* 1.51*  CALCIUM 8.8* 8.4* 8.5*   GFR: Estimated Creatinine Clearance: 35.6 mL/min (A) (by C-G formula based on SCr of 1.51 mg/dL (H)).  Liver Function Tests: Recent Labs  Lab 09/04/19 1300 09/05/19 0812  AST 35 21  ALT 19 15  ALKPHOS 46 43  BILITOT 1.3* 1.4*   PROT 6.9 6.5  ALBUMIN 3.8 3.3*    Coagulation Profile: Recent Labs  Lab 09/04/19 1300 09/06/19 0719  INR 1.1 1.2    HbA1C: Hgb A1c MFr Bld  Date/Time Value Ref Range Status  09/04/2019 03:46 PM 5.9 (H) 4.8 - 5.6 % Final    Comment:    (NOTE) Pre diabetes:          5.7%-6.4% Diabetes:              >6.4% Glycemic control for   <7.0% adults with diabetes   06/06/2016 08:14 AM 5.8 4.6 - 6.5 % Final    Comment:    Glycemic Control Guidelines for People with Diabetes:Non Diabetic:  <6%Goal of Therapy: <7%Additional Action Suggested:  >8%     CBG: Recent Labs  Lab 09/06/19 0756 09/06/19 1128 09/06/19 1552 09/06/19 2105 09/07/19 0632  GLUCAP 77 127* 137* 133* 104*    Recent Results (from the past 240 hour(s))  Respiratory Panel by RT PCR (Flu A&B, Covid) - Nasopharyngeal Swab     Status: None   Collection Time: 09/04/19  1:36 PM   Specimen: Nasopharyngeal Swab  Result Value Ref Range Status   SARS Coronavirus 2 by RT PCR NEGATIVE NEGATIVE Final    Comment: (NOTE) SARS-CoV-2 target nucleic acids are NOT DETECTED. The SARS-CoV-2 RNA is generally detectable in upper respiratoy specimens during the acute phase of infection. The lowest concentration of SARS-CoV-2 viral copies this assay can detect is 131 copies/mL. A negative result does not preclude SARS-Cov-2 infection and should not be used as the sole basis for treatment or other patient management decisions. A negative result may occur with  improper specimen collection/handling, submission of specimen other than nasopharyngeal swab, presence of viral mutation(s) within the areas targeted by this assay, and inadequate number of viral copies (<131 copies/mL). A negative result must be combined with clinical observations, patient history, and epidemiological information. The expected result is Negative. Fact Sheet for Patients:  PinkCheek.be Fact Sheet for Healthcare Providers:   GravelBags.it This test is not yet ap proved or cleared by the [place] FDA and  has been authorized for detection and/or diagnosis of SARS-CoV-2 by FDA under an Emergency Use Authorization (EUA). This EUA will remain  in effect (meaning this test can be used) for the duration of the COVID-19 declaration under Section 564(b)(1) of the Act, 21 U.S.C. section 360bbb-3(b)(1), unless the authorization is terminated or revoked sooner.    Influenza A by PCR NEGATIVE NEGATIVE Final   Influenza B by PCR NEGATIVE NEGATIVE Final    Comment: (NOTE) The Xpert Xpress SARS-CoV-2/FLU/RSV assay is intended as an aid in  the diagnosis of influenza from Nasopharyngeal swab specimens and  should not be used as a sole basis for treatment. Nasal washings and  aspirates are unacceptable for Xpert Xpress SARS-CoV-2/FLU/RSV  testing. Fact Sheet for Patients: PinkCheek.be Fact Sheet for Healthcare Providers: GravelBags.it This test is not yet approved or cleared by the [place] FDA and  has been  authorized for detection and/or diagnosis of SARS-CoV-2 by  FDA under an Emergency Use Authorization (EUA). This EUA will remain  in effect (meaning this test can be used) for the duration of the  Covid-19 declaration under Section 564(b)(1) of the Act, 21  U.S.C. section 360bbb-3(b)(1), unless the authorization is  terminated or revoked. Performed at [facility], [place] [address] [place], [place] [address]   MRSA PCR Screening     Status: None   Collection Time: 09/04/19  3:46 PM   Specimen: Nasopharyngeal  Result Value Ref Range Status   MRSA by PCR NEGATIVE NEGATIVE Final    Comment:        The GeneXpert MRSA Assay (FDA approved for NASAL specimens only), is one component of a comprehensive MRSA colonization surveillance program. It is not intended to diagnose MRSA infection nor to guide or monitor  treatment for MRSA infections. Performed at [facility], [place] [address], [place], [place] [address]   Culture, blood (routine x 2)     Status: None (Preliminary result)   Collection Time: 09/06/19 10:30 AM   Specimen: BLOOD RIGHT ARM  Result Value Ref Range Status   Specimen Description BLOOD RIGHT ARM  Final   Special Requests   Final    BOTTLES DRAWN AEROBIC AND ANAEROBIC Blood Culture adequate volume   Culture   Final    NO GROWTH <12 HOURS Performed at [facility], [place] [address] [place], [place] [address]    Report Status PENDING  Incomplete  Culture, blood (routine x 2)     Status: None (Preliminary result)   Collection Time: 09/06/19 10:35 AM   Specimen: BLOOD LEFT HAND  Result Value Ref Range Status   Specimen Description BLOOD LEFT HAND  Final   Special Requests   Final    BOTTLES DRAWN AEROBIC ONLY Blood Culture results may not be optimal due to an inadequate volume of blood received in culture bottles   Culture   Final    NO GROWTH <12 HOURS Performed at [facility], [place] [address], [place], [place] [address]    Report Status PENDING  Incomplete     Scheduled Meds: . chlorhexidine  15 mL Mouth Rinse BID  . Chlorhexidine Gluconate Cloth  6 each Topical Daily  . insulin aspart  0-5 Units Subcutaneous QHS  . insulin aspart  0-9 Units Subcutaneous TID WC  . mouth rinse  15 mL Mouth Rinse q12n4p  . pantoprazole  40 mg Oral QHS  . senna-docusate  1 tablet Oral BID  . sodium chloride flush  3 mL Intravenous Once   Continuous Infusions: . sodium chloride 50 mL/hr at 09/06/19 1834     LOS: 3 days   Cherene Altes, MD [facility] Office  [phone] Pager - Text Page per Shea Evans  If 7PM-7AM, please contact night-coverage per Amion 09/07/2019, 9:14 AM

## 2019-09-08 LAB — CBC
HCT: 39.5 % (ref 39.0–52.0)
Hemoglobin: 13.5 g/dL (ref 13.0–17.0)
MCH: 30.8 pg (ref 26.0–34.0)
MCHC: 34.2 g/dL (ref 30.0–36.0)
MCV: 90 fL (ref 80.0–100.0)
Platelets: 77 10*3/uL — ABNORMAL LOW (ref 150–400)
RBC: 4.39 MIL/uL (ref 4.22–5.81)
RDW: 13.2 % (ref 11.5–15.5)
WBC: 7.9 10*3/uL (ref 4.0–10.5)
nRBC: 0 % (ref 0.0–0.2)

## 2019-09-08 LAB — BASIC METABOLIC PANEL
Anion gap: 5 (ref 5–15)
BUN: 21 mg/dL (ref 8–23)
CO2: 23 mmol/L (ref 22–32)
Calcium: 8.2 mg/dL — ABNORMAL LOW (ref 8.9–10.3)
Chloride: 109 mmol/L (ref 98–111)
Creatinine, Ser: 1.37 mg/dL — ABNORMAL HIGH (ref 0.61–1.24)
GFR calc Af Amer: 51 mL/min — ABNORMAL LOW (ref >60)
GFR calc non Af Amer: 44 mL/min — ABNORMAL LOW (ref >60)
Glucose, Bld: 118 mg/dL — ABNORMAL HIGH (ref 70–99)
Potassium: 3.6 mmol/L (ref 3.5–5.1)
Sodium: 137 mmol/L (ref 135–145)

## 2019-09-08 LAB — GLUCOSE, CAPILLARY
Glucose-Capillary: 109 mg/dL — ABNORMAL HIGH (ref 70–99)
Glucose-Capillary: 96 mg/dL (ref 70–99)
Glucose-Capillary: 100 mg/dL — ABNORMAL HIGH (ref 70–99)

## 2019-09-08 MED ORDER — BISACODYL 10 MG RE SUPP: 10.0000 mg | Freq: Every day | RECTAL | Status: DC | PRN

## 2019-09-08 MED ORDER — LACTULOSE 10 GM/15ML PO SOLN
20.0000 g | Freq: Two times a day (BID) | ORAL | Status: DC | PRN
  Administered 2019-09-09: 20 g via ORAL
  Filled 2019-09-08: qty 30

## 2019-09-08 NOTE — Progress Notes (Signed)
David [name]  [id number] DOB: 09-Oct-1923 DOA: 09/04/2019 PCP: Cassandria Anger, MD    Brief Narrative:  86 year old with a history of aortic valve replacement, impaired glucose tolerance, HTN, HLD, COPD, and CAD who presented to the hospital with dysarthria and aphasia accompanied by right-sided weakness right facial droop and a left gaze preference.  He was admitted to the stroke service and underwent extensive evaluation which ultimately revealed a left basal ganglia intracranial hemorrhage felt to be related to small vessel disease versus thrombocytopenia.  Antimicrobials:  None  Subjective: Remains clinically stable awaiting placement within a rehab bed.  Assessment & Plan:  Left basal ganglia (lentiform nucleus) intracranial hemorrhage Felt to be related to small vessel disease versus thrombocytopenia -CT cervical spine without traumatic injury -CTa head and neck confirmed left basal ganglia ICH with no large vessel obstruction -MRI revealed no underlying lesions but did suggest old bilateral basal ganglia infarcts and small vessel disease -TTE noted an EF of 60-65% with no clear source of embolization appreciated  Pulmonic valve "vegetation" Noted on TTE - no fever or other clinical evidence to suggest SBE -it appears that this is not a new issue but actually chronically has been followed by his cardiologist and I do not feel that further evaluation is presently indicated  Acute kidney injury Creatinine improving with gentle hydration  Status post bioprosthetic aortic valve replacement No clinical evidence of valvular dysfunction at this time -followed by routine echo as per Dr. Ellouise Newer  HTN Goal is to keep SBP less than 140 -currently well controlled  HLD LDL noted to be 138 during this admission -intolerant to statin -resume Zetia at discharge  Thrombocytopenia Following platelet count which is holding steady  Dysphagia Due to hemorrhagic stroke -SLP following  -diet able to be advanced somewhat by SLP today therefore hopefully patient will not require a core track  DVT prophylaxis: SCDs Code Status: FULL CODE Family Communication: Spoke with daughter at bedside Disposition Plan: Awaiting placement in CIR -medically ready for disposition when bed available  Consultants:  none  Objective: Blood pressure 134/63, pulse 72, temperature 99.5 F (37.5 C), temperature source Oral, resp. rate 16, height 5\' 11"  (1.803 m), weight 97.6 kg, SpO2 96 %.  Intake/Output Summary (Last 24 hours) at 09/08/2019 0918 Last data filed at 09/08/2019 0401 Gross per 24 hour  Intake 240 ml  Output 1300 ml  Net -1060 ml   Filed Weights   09/04/19 1341 09/04/19 1600  Weight: 97.7 kg 97.6 kg    Examination: General: No acute respiratory distress Lungs: CTA B Cardiovascular: RRR  Abdomen: NT/ND, soft, BS positive Extremities: No edema bilateral lower extremities  CBC: Recent Labs  Lab 09/04/19 1300 09/04/19 1305 09/06/19 0520 09/07/19 0438 09/08/19 0326  WBC 8.4   < > 6.4 7.8 7.9  NEUTROABS 6.6  --   --   --   --   HGB 15.8   < > 15.9 14.2 13.5  HCT 47.7   < > 47.6 42.7 39.5  MCV 93.5   < > 95.2 90.7 90.0  PLT 85*   < > 87* 78* 77*   < > = values in this interval not displayed.   Basic Metabolic Panel: Recent Labs  Lab 09/06/19 0520 09/07/19 0438 09/08/19 0326  NA 137 137 137  K 4.5 3.8 3.6  CL 107 106 109  CO2 17* 22 23  GLUCOSE 85 120* 118*  BUN 17 27* 21  CREATININE 1.31* 1.51* 1.37*  CALCIUM  8.4* 8.5* 8.2*   GFR: Estimated Creatinine Clearance: 39.3 mL/min (A) (by C-G formula based on SCr of 1.37 mg/dL (H)).  Liver Function Tests: Recent Labs  Lab 09/04/19 1300 09/05/19 0812  AST 35 21  ALT 19 15  ALKPHOS 46 43  BILITOT 1.3* 1.4*  PROT 6.9 6.5  ALBUMIN 3.8 3.3*    Coagulation Profile: Recent Labs  Lab 09/04/19 1300 09/06/19 0719  INR 1.1 1.2    HbA1C: Hgb A1c MFr Bld  Date/Time Value Ref Range Status  09/04/2019  03:46 PM 5.9 (H) 4.8 - 5.6 % Final    Comment:    (NOTE) Pre diabetes:          5.7%-6.4% Diabetes:              >6.4% Glycemic control for   <7.0% adults with diabetes   06/06/2016 08:14 AM 5.8 4.6 - 6.5 % Final    Comment:    Glycemic Control Guidelines for People with Diabetes:Non Diabetic:  <6%Goal of Therapy: <7%Additional Action Suggested:  >8%     CBG: Recent Labs  Lab 09/07/19 0632 09/07/19 1132 09/07/19 1622 09/07/19 2112 09/08/19 0603  GLUCAP 104* 109* 110* 103* 109*    Recent Results (from the past 240 hour(s))  Respiratory Panel by RT PCR (Flu A&B, Covid) - Nasopharyngeal Swab     Status: None   Collection Time: 09/04/19  1:36 PM   Specimen: Nasopharyngeal Swab  Result Value Ref Range Status   SARS Coronavirus 2 by RT PCR NEGATIVE NEGATIVE Final    Comment: (NOTE) SARS-CoV-2 target nucleic acids are NOT DETECTED. The SARS-CoV-2 RNA is generally detectable in upper respiratoy specimens during the acute phase of infection. The lowest concentration of SARS-CoV-2 viral copies this assay can detect is 131 copies/mL. A negative result does not preclude SARS-Cov-2 infection and should not be used as the sole basis for treatment or other patient management decisions. A negative result may occur with  improper specimen collection/handling, submission of specimen other than nasopharyngeal swab, presence of viral mutation(s) within the areas targeted by this assay, and inadequate number of viral copies (<131 copies/mL). A negative result must be combined with clinical observations, patient history, and epidemiological information. The expected result is Negative. Fact Sheet for Patients:  PinkCheek.be Fact Sheet for Healthcare Providers:  GravelBags.it This test is not yet ap proved or cleared by the [place] FDA and  has been authorized for detection and/or diagnosis of SARS-CoV-2 by FDA under an  Emergency Use Authorization (EUA). This EUA will remain  in effect (meaning this test can be used) for the duration of the COVID-19 declaration under Section 564(b)(1) of the Act, 21 U.S.C. section 360bbb-3(b)(1), unless the authorization is terminated or revoked sooner.    Influenza A by PCR NEGATIVE NEGATIVE Final   Influenza B by PCR NEGATIVE NEGATIVE Final    Comment: (NOTE) The Xpert Xpress SARS-CoV-2/FLU/RSV assay is intended as an aid in  the diagnosis of influenza from Nasopharyngeal swab specimens and  should not be used as a sole basis for treatment. Nasal washings and  aspirates are unacceptable for Xpert Xpress SARS-CoV-2/FLU/RSV  testing. Fact Sheet for Patients: PinkCheek.be Fact Sheet for Healthcare Providers: GravelBags.it This test is not yet approved or cleared by the [place] FDA and  has been authorized for detection and/or diagnosis of SARS-CoV-2 by  FDA under an Emergency Use Authorization (EUA). This EUA will remain  in effect (meaning this test can be used) for the duration of  the  Covid-19 declaration under Section 564(b)(1) of the Act, 21  U.S.C. section 360bbb-3(b)(1), unless the authorization is  terminated or revoked. Performed at [facility], [place] [address], [place], [place] [address]   MRSA PCR Screening     Status: None   Collection Time: 09/04/19  3:46 PM   Specimen: Nasopharyngeal  Result Value Ref Range Status   MRSA by PCR NEGATIVE NEGATIVE Final    Comment:        The GeneXpert MRSA Assay (FDA approved for NASAL specimens only), is one component of a comprehensive MRSA colonization surveillance program. It is not intended to diagnose MRSA infection nor to guide or monitor treatment for MRSA infections. Performed at [facility], [place] [address] [place], [place] [address]   Culture, blood (routine x 2)     Status: None (Preliminary result)   Collection Time:  09/06/19 10:30 AM   Specimen: BLOOD RIGHT ARM  Result Value Ref Range Status   Specimen Description BLOOD RIGHT ARM  Final   Special Requests   Final    BOTTLES DRAWN AEROBIC AND ANAEROBIC Blood Culture adequate volume   Culture   Final    NO GROWTH 1 DAY Performed at [facility], [place] [address] [place], [place] [address]    Report Status PENDING  Incomplete  Culture, blood (routine x 2)     Status: None (Preliminary result)   Collection Time: 09/06/19 10:35 AM   Specimen: BLOOD LEFT HAND  Result Value Ref Range Status   Specimen Description BLOOD LEFT HAND  Final   Special Requests   Final    BOTTLES DRAWN AEROBIC ONLY Blood Culture results may not be optimal due to an inadequate volume of blood received in culture bottles   Culture   Final    NO GROWTH 1 DAY Performed at [facility], [place] [address] [place], [place] [address]    Report Status PENDING  Incomplete     Scheduled Meds: . chlorhexidine  15 mL Mouth Rinse BID  . Chlorhexidine Gluconate Cloth  6 each Topical Daily  . insulin aspart  0-5 Units Subcutaneous QHS  . insulin aspart  0-9 Units Subcutaneous TID WC  . mouth rinse  15 mL Mouth Rinse q12n4p  . pantoprazole  40 mg Oral QHS  . senna-docusate  1 tablet Oral BID  . sodium chloride flush  3 mL Intravenous Once   Continuous Infusions: . sodium chloride 50 mL/hr at 09/06/19 1834     LOS: 4 days   Cherene Altes, MD [facility] Office  [phone] Pager - Text Page per Shea Evans  If 7PM-7AM, please contact night-coverage per Amion 09/08/2019, 9:18 AM

## 2019-09-09 ENCOUNTER — Encounter (HOSPITAL_COMMUNITY): Payer: Self-pay | Admitting: Physical Medicine & Rehabilitation

## 2019-09-09 ENCOUNTER — Inpatient Hospital Stay (HOSPITAL_COMMUNITY)
Admission: RE | Admit: 2019-09-09 | Discharge: 2019-10-07 | DRG: 057 | Disposition: A | Discharge status: Skilled Nursing Facility | Payer: Medicare Other | Source: Intra-hospital | Attending: Physical Medicine & Rehabilitation | Admitting: Physical Medicine & Rehabilitation

## 2019-09-09 DIAGNOSIS — I69151 Hemiplegia and hemiparesis following nontraumatic intracerebral hemorrhage affecting right dominant side: Secondary | ICD-10-CM | POA: Diagnosis not present

## 2019-09-09 DIAGNOSIS — I2510 Atherosclerotic heart disease of native coronary artery without angina pectoris: Secondary | ICD-10-CM | POA: Diagnosis present

## 2019-09-09 DIAGNOSIS — E785 Hyperlipidemia, unspecified: Secondary | ICD-10-CM | POA: Diagnosis present

## 2019-09-09 DIAGNOSIS — R140 Abdominal distension (gaseous): Secondary | ICD-10-CM | POA: Diagnosis not present

## 2019-09-09 DIAGNOSIS — R1313 Dysphagia, pharyngeal phase: Secondary | ICD-10-CM | POA: Diagnosis present

## 2019-09-09 DIAGNOSIS — R58 Hemorrhage, not elsewhere classified: Secondary | ICD-10-CM | POA: Diagnosis not present

## 2019-09-09 DIAGNOSIS — R7301 Impaired fasting glucose: Secondary | ICD-10-CM | POA: Diagnosis present

## 2019-09-09 DIAGNOSIS — R410 Disorientation, unspecified: Secondary | ICD-10-CM | POA: Diagnosis not present

## 2019-09-09 DIAGNOSIS — F09 Unspecified mental disorder due to known physiological condition: Secondary | ICD-10-CM | POA: Diagnosis not present

## 2019-09-09 DIAGNOSIS — R1310 Dysphagia, unspecified: Secondary | ICD-10-CM

## 2019-09-09 DIAGNOSIS — I619 Nontraumatic intracerebral hemorrhage, unspecified: Secondary | ICD-10-CM | POA: Diagnosis present

## 2019-09-09 DIAGNOSIS — N179 Acute kidney failure, unspecified: Secondary | ICD-10-CM | POA: Diagnosis present

## 2019-09-09 DIAGNOSIS — Z87891 Personal history of nicotine dependence: Secondary | ICD-10-CM

## 2019-09-09 DIAGNOSIS — R0989 Other specified symptoms and signs involving the circulatory and respiratory systems: Secondary | ICD-10-CM | POA: Diagnosis present

## 2019-09-09 DIAGNOSIS — D696 Thrombocytopenia, unspecified: Secondary | ICD-10-CM | POA: Diagnosis present

## 2019-09-09 DIAGNOSIS — K5900 Constipation, unspecified: Secondary | ICD-10-CM | POA: Diagnosis present

## 2019-09-09 DIAGNOSIS — I612 Nontraumatic intracerebral hemorrhage in hemisphere, unspecified: Secondary | ICD-10-CM

## 2019-09-09 DIAGNOSIS — I69192 Facial weakness following nontraumatic intracerebral hemorrhage: Secondary | ICD-10-CM | POA: Diagnosis not present

## 2019-09-09 DIAGNOSIS — I739 Peripheral vascular disease, unspecified: Secondary | ICD-10-CM | POA: Diagnosis present

## 2019-09-09 DIAGNOSIS — R509 Fever, unspecified: Secondary | ICD-10-CM | POA: Diagnosis not present

## 2019-09-09 DIAGNOSIS — F0789 Other personality and behavioral disorders due to known physiological condition: Secondary | ICD-10-CM | POA: Diagnosis not present

## 2019-09-09 DIAGNOSIS — J189 Pneumonia, unspecified organism: Secondary | ICD-10-CM

## 2019-09-09 DIAGNOSIS — F329 Major depressive disorder, single episode, unspecified: Secondary | ICD-10-CM | POA: Diagnosis not present

## 2019-09-09 DIAGNOSIS — Z7982 Long term (current) use of aspirin: Secondary | ICD-10-CM

## 2019-09-09 DIAGNOSIS — J449 Chronic obstructive pulmonary disease, unspecified: Secondary | ICD-10-CM | POA: Diagnosis present

## 2019-09-09 DIAGNOSIS — I69112 Visuospatial deficit and spatial neglect following nontraumatic intracerebral hemorrhage: Secondary | ICD-10-CM

## 2019-09-09 DIAGNOSIS — Z20822 Contact with and (suspected) exposure to covid-19: Secondary | ICD-10-CM | POA: Diagnosis present

## 2019-09-09 DIAGNOSIS — R7309 Other abnormal glucose: Secondary | ICD-10-CM

## 2019-09-09 DIAGNOSIS — R21 Rash and other nonspecific skin eruption: Secondary | ICD-10-CM | POA: Diagnosis not present

## 2019-09-09 DIAGNOSIS — I69391 Dysphagia following cerebral infarction: Secondary | ICD-10-CM

## 2019-09-09 DIAGNOSIS — Z882 Allergy status to sulfonamides status: Secondary | ICD-10-CM

## 2019-09-09 DIAGNOSIS — Z79899 Other long term (current) drug therapy: Secondary | ICD-10-CM

## 2019-09-09 DIAGNOSIS — R001 Bradycardia, unspecified: Secondary | ICD-10-CM | POA: Diagnosis not present

## 2019-09-09 DIAGNOSIS — G459 Transient cerebral ischemic attack, unspecified: Secondary | ICD-10-CM | POA: Diagnosis not present

## 2019-09-09 DIAGNOSIS — R4701 Aphasia: Secondary | ICD-10-CM | POA: Diagnosis present

## 2019-09-09 DIAGNOSIS — R251 Tremor, unspecified: Secondary | ICD-10-CM | POA: Diagnosis present

## 2019-09-09 DIAGNOSIS — Z952 Presence of prosthetic heart valve: Secondary | ICD-10-CM | POA: Diagnosis not present

## 2019-09-09 DIAGNOSIS — M1990 Unspecified osteoarthritis, unspecified site: Secondary | ICD-10-CM | POA: Diagnosis not present

## 2019-09-09 DIAGNOSIS — K219 Gastro-esophageal reflux disease without esophagitis: Secondary | ICD-10-CM | POA: Diagnosis present

## 2019-09-09 DIAGNOSIS — I10 Essential (primary) hypertension: Secondary | ICD-10-CM

## 2019-09-09 DIAGNOSIS — I610 Nontraumatic intracerebral hemorrhage in hemisphere, subcortical: Secondary | ICD-10-CM | POA: Diagnosis not present

## 2019-09-09 DIAGNOSIS — I69320 Aphasia following cerebral infarction: Secondary | ICD-10-CM | POA: Diagnosis not present

## 2019-09-09 DIAGNOSIS — R5381 Other malaise: Secondary | ICD-10-CM | POA: Diagnosis not present

## 2019-09-09 DIAGNOSIS — I69191 Dysphagia following nontraumatic intracerebral hemorrhage: Secondary | ICD-10-CM | POA: Diagnosis not present

## 2019-09-09 DIAGNOSIS — J690 Pneumonitis due to inhalation of food and vomit: Secondary | ICD-10-CM | POA: Diagnosis not present

## 2019-09-09 DIAGNOSIS — I69120 Aphasia following nontraumatic intracerebral hemorrhage: Secondary | ICD-10-CM | POA: Diagnosis not present

## 2019-09-09 DIAGNOSIS — G8929 Other chronic pain: Secondary | ICD-10-CM | POA: Diagnosis present

## 2019-09-09 DIAGNOSIS — I69118 Other symptoms and signs involving cognitive functions following nontraumatic intracerebral hemorrhage: Secondary | ICD-10-CM

## 2019-09-09 DIAGNOSIS — M2550 Pain in unspecified joint: Secondary | ICD-10-CM | POA: Diagnosis not present

## 2019-09-09 DIAGNOSIS — R0602 Shortness of breath: Secondary | ICD-10-CM | POA: Diagnosis not present

## 2019-09-09 DIAGNOSIS — R7302 Impaired glucose tolerance (oral): Secondary | ICD-10-CM | POA: Diagnosis present

## 2019-09-09 DIAGNOSIS — R1312 Dysphagia, oropharyngeal phase: Secondary | ICD-10-CM | POA: Diagnosis not present

## 2019-09-09 DIAGNOSIS — I69351 Hemiplegia and hemiparesis following cerebral infarction affecting right dominant side: Secondary | ICD-10-CM | POA: Diagnosis not present

## 2019-09-09 DIAGNOSIS — H8110 Benign paroxysmal vertigo, unspecified ear: Secondary | ICD-10-CM | POA: Diagnosis not present

## 2019-09-09 DIAGNOSIS — Z720 Tobacco use: Secondary | ICD-10-CM | POA: Diagnosis not present

## 2019-09-09 DIAGNOSIS — Z888 Allergy status to other drugs, medicaments and biological substances status: Secondary | ICD-10-CM

## 2019-09-09 DIAGNOSIS — I69122 Dysarthria following nontraumatic intracerebral hemorrhage: Secondary | ICD-10-CM

## 2019-09-09 DIAGNOSIS — Z6834 Body mass index (BMI) 34.0-34.9, adult: Secondary | ICD-10-CM

## 2019-09-09 DIAGNOSIS — I69328 Other speech and language deficits following cerebral infarction: Secondary | ICD-10-CM | POA: Diagnosis not present

## 2019-09-09 DIAGNOSIS — Z951 Presence of aortocoronary bypass graft: Secondary | ICD-10-CM

## 2019-09-09 DIAGNOSIS — Z7401 Bed confinement status: Secondary | ICD-10-CM | POA: Diagnosis not present

## 2019-09-09 DIAGNOSIS — E669 Obesity, unspecified: Secondary | ICD-10-CM | POA: Diagnosis present

## 2019-09-09 DIAGNOSIS — K5901 Slow transit constipation: Secondary | ICD-10-CM | POA: Diagnosis not present

## 2019-09-09 DIAGNOSIS — Z953 Presence of xenogenic heart valve: Secondary | ICD-10-CM

## 2019-09-09 LAB — CBC
HCT: 39.6 % (ref 39.0–52.0)
Hemoglobin: 13.4 g/dL (ref 13.0–17.0)
MCH: 30.9 pg (ref 26.0–34.0)
MCHC: 33.8 g/dL (ref 30.0–36.0)
MCV: 91.2 fL (ref 80.0–100.0)
Platelets: 73 10*3/uL — ABNORMAL LOW (ref 150–400)
RBC: 4.34 MIL/uL (ref 4.22–5.81)
RDW: 13.2 % (ref 11.5–15.5)
WBC: 7 10*3/uL (ref 4.0–10.5)
nRBC: 0 % (ref 0.0–0.2)

## 2019-09-09 LAB — BASIC METABOLIC PANEL
Anion gap: 6 (ref 5–15)
BUN: 17 mg/dL (ref 8–23)
CO2: 21 mmol/L — ABNORMAL LOW (ref 22–32)
Calcium: 8.1 mg/dL — ABNORMAL LOW (ref 8.9–10.3)
Chloride: 110 mmol/L (ref 98–111)
Creatinine, Ser: 1.22 mg/dL (ref 0.61–1.24)
GFR calc Af Amer: 58 mL/min — ABNORMAL LOW (ref >60)
GFR calc non Af Amer: 50 mL/min — ABNORMAL LOW (ref >60)
Glucose, Bld: 103 mg/dL — ABNORMAL HIGH (ref 70–99)
Potassium: 3.7 mmol/L (ref 3.5–5.1)
Sodium: 137 mmol/L (ref 135–145)

## 2019-09-09 LAB — GLUCOSE, CAPILLARY
Glucose-Capillary: 91 mg/dL (ref 70–99)
Glucose-Capillary: 127 mg/dL — ABNORMAL HIGH (ref 70–99)
Glucose-Capillary: 102 mg/dL — ABNORMAL HIGH (ref 70–99)
Glucose-Capillary: 87 mg/dL (ref 70–99)

## 2019-09-09 MED ORDER — LACTULOSE 10 GM/15ML PO SOLN
20.0000 g | Freq: Two times a day (BID) | ORAL | Status: DC | PRN
  Administered 2019-09-24: 20 g via ORAL
  Filled 2019-09-09 (×3): qty 30

## 2019-09-09 MED ORDER — SENNOSIDES-DOCUSATE SODIUM 8.6-50 MG PO TABS: 1.0000 | ORAL_TABLET | Freq: Every evening | ORAL | Status: DC | PRN

## 2019-09-09 MED ORDER — BISACODYL 10 MG RE SUPP
10.0000 mg | Freq: Every day | RECTAL | Status: DC | PRN
  Administered 2019-09-10 – 2019-09-26 (×3): 10 mg via RECTAL
  Filled 2019-09-09 (×3): qty 1

## 2019-09-09 MED ORDER — FLEET ENEMA 7-19 GM/118ML RE ENEM: 1.0000 | ENEMA | Freq: Once | RECTAL | Status: DC | PRN

## 2019-09-09 MED ORDER — ORAL CARE MOUTH RINSE
15.0000 mL | Freq: Two times a day (BID) | OROMUCOSAL | Status: DC
  Administered 2019-09-11 – 2019-10-07 (×44): 15 mL via OROMUCOSAL

## 2019-09-09 MED ORDER — ALUM & MAG HYDROXIDE-SIMETH 200-200-20 MG/5ML PO SUSP: 30.0000 mL | ORAL | Status: DC | PRN

## 2019-09-09 MED ORDER — CHLORHEXIDINE GLUCONATE CLOTH 2 % EX PADS
6.0000 | MEDICATED_PAD | Freq: Every day | CUTANEOUS | Status: DC
  Administered 2019-09-10 – 2019-09-18 (×9): 6 via TOPICAL

## 2019-09-09 MED ORDER — PROCHLORPERAZINE 25 MG RE SUPP: 12.5000 mg | Freq: Four times a day (QID) | RECTAL | Status: DC | PRN

## 2019-09-09 MED ORDER — CLONIDINE HCL 0.1 MG PO TABS
0.1000 mg | ORAL_TABLET | Freq: Four times a day (QID) | ORAL | Status: DC | PRN
  Administered 2019-09-24 – 2019-10-01 (×2): 0.1 mg via ORAL
  Filled 2019-09-09 (×2): qty 1

## 2019-09-09 MED ORDER — SODIUM CHLORIDE 0.9 % IV SOLN: INTRAVENOUS | Status: DC

## 2019-09-09 MED ORDER — GUAIFENESIN-DM 100-10 MG/5ML PO SYRP: 5.0000 mL | ORAL_SOLUTION | Freq: Four times a day (QID) | ORAL | Status: DC | PRN

## 2019-09-09 MED ORDER — TRAZODONE HCL 50 MG PO TABS
25.0000 mg | ORAL_TABLET | Freq: Every evening | ORAL | Status: DC | PRN
  Administered 2019-09-20 – 2019-10-06 (×5): 50 mg via ORAL
  Filled 2019-09-09 (×5): qty 1

## 2019-09-09 MED ORDER — INSULIN ASPART 100 UNIT/ML ~~LOC~~ SOLN
0.0000 [IU] | Freq: Three times a day (TID) | SUBCUTANEOUS | Status: DC
  Administered 2019-09-19 – 2019-10-06 (×17): 1 [IU] via SUBCUTANEOUS
  Administered 2019-10-07: 2 [IU] via SUBCUTANEOUS

## 2019-09-09 MED ORDER — SENNOSIDES-DOCUSATE SODIUM 8.6-50 MG PO TABS
1.0000 | ORAL_TABLET | Freq: Two times a day (BID) | ORAL | Status: DC
  Administered 2019-09-10 – 2019-09-26 (×33): 1 via ORAL
  Filled 2019-09-09 (×33): qty 1

## 2019-09-09 MED ORDER — PROCHLORPERAZINE MALEATE 5 MG PO TABS: 5.0000 mg | ORAL_TABLET | Freq: Four times a day (QID) | ORAL | Status: DC | PRN

## 2019-09-09 MED ORDER — PROCHLORPERAZINE EDISYLATE 10 MG/2ML IJ SOLN: 5.0000 mg | Freq: Four times a day (QID) | INTRAMUSCULAR | Status: DC | PRN

## 2019-09-09 MED ORDER — DIPHENHYDRAMINE HCL 12.5 MG/5ML PO ELIX: 12.5000 mg | ORAL_SOLUTION | Freq: Four times a day (QID) | ORAL | Status: DC | PRN

## 2019-09-09 MED ORDER — INSULIN ASPART 100 UNIT/ML ~~LOC~~ SOLN: 0.0000 [IU] | Freq: Every day | SUBCUTANEOUS | Status: DC

## 2019-09-09 MED ORDER — ACETAMINOPHEN 325 MG PO TABS
325.0000 mg | ORAL_TABLET | ORAL | Status: DC | PRN
  Administered 2019-09-11 – 2019-10-06 (×17): 650 mg via ORAL
  Filled 2019-09-09 (×19): qty 2

## 2019-09-09 MED ORDER — CHLORHEXIDINE GLUCONATE 0.12 % MT SOLN
15.0000 mL | Freq: Two times a day (BID) | OROMUCOSAL | Status: DC
  Administered 2019-09-10 – 2019-09-19 (×15): 15 mL via OROMUCOSAL
  Filled 2019-09-09 (×15): qty 15

## 2019-09-09 MED ORDER — SODIUM CHLORIDE 0.9 % IV SOLN
Freq: Every day | INTRAVENOUS | Status: DC
  Filled 2019-09-09 (×16): qty 1000

## 2019-09-09 MED ORDER — PANTOPRAZOLE SODIUM 40 MG PO TBEC
40.0000 mg | DELAYED_RELEASE_TABLET | Freq: Every day | ORAL | Status: DC
  Filled 2019-09-09: qty 1

## 2019-09-09 MED ORDER — SENNOSIDES 8.8 MG/5ML PO SYRP
10.0000 mL | ORAL_SOLUTION | Freq: Every day | ORAL | Status: DC
  Filled 2019-09-09 (×2): qty 10

## 2019-09-09 NOTE — H&P (Signed)
Physical Medicine and Rehabilitation Admission H&P    Chief Complaint  Patient presents with  . Functional decline due to [place]    HPI:  David [name] is a 86 year old male with history of CAD, COPD, LBP, tremors, AVR who was admitted on 04/21/21with right facial weakness, dysarthria, right hemiparesis, and left gaze preference.  Daughter had left for MD appointment and when she returned an hour later she was lying on the floor between beds.  EMS was activated and CT head done showing acute left ICH.  CTA head/neck was negative for vascular malformation and left basal ganglia was negative for spot sign.  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  Bleed was felt to be due to small vessel disease versus thrombocytopenia as platelets with 93 at admission.  2D echo was done showing EF of 60 to 65% with mild LVH and bioprosthetic aortic valve as well as 1.1 cm with vegetation attached to the RV side of pulmonic valve.  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 Claiborne Billings.  Is patient without fevers or other clinical indication of SBE TEE was not felt to be indicated.  MBS was done on 423 showing moderate dysphagia with delayed swallow and trace aspiration of thins--he was placed on dysphagia 1, honey liquids by spoon.  Patient with resultant dysphagia, aphasia, delayed processing with difficulty following one-step commands BLE weakness with visual impairments affecting ADLs and mobility.  CIR was recommended due to functional decline.   Review of Systems  Unable to perform ROS: Language     Past Medical History:  Diagnosis Date  . CAD (coronary artery disease)    intolerance of statins  . COPD (chronic obstructive pulmonary disease) ([place])    . Current use of long term anticoagulation   . GERD (gastroesophageal reflux disease)   . Hyperlipidemia   . Hypertension   . Impaired glucose tolerance 02/14/2011  . LBP (low back pain)   . OA (osteoarthritis)   . 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    Past Surgical History:  Procedure Laterality Date  . 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 CARDIAC CATHETERIZATION  10/05/1998   Recommend CABG revascularizaition  . CARDIAC CATHETERIZATION  06/11/1999   Continue medical therapy  . CARDIAC CATHETERIZATION  01/08/2007   Patent grafts, recommend medical therapy  . CARDIOVASCULAR STRESS TEST  04/22/2010   Small lateral defect which is partially reversible, no ischemic EKG changes were noted.  Marland Kitchen CAROTID DOPPLER  04/22/2010   Bilateral ICAs-no evidence of diametere reduction, significant tortuosity, or any other vascular abnormality.  . CORONARY ARTERY BYPASS GRAFT  10/09/1998   LIMA to LAD, SVG to diag, SVG sequential to 2 marginals, SVG sequential to PDA & PLA. CE#25 porcine AVR.  Marland Kitchen TRANSTHORACIC ECHOCARDIOGRAM  05/14/2012   EF 50-55%, bioprosthesis of the aortic valve with well preserved LV function, moderate concentric [place]    Family History  Problem Relation Age of Onset  . Coronary artery disease Other   . Hypertension Other     Social History: Widowed and daughter has been living with him.  Retired Printmaker.  Reports that he quit smoking about 21 years ago. His smoking use included pipe. He has quit using  smokeless tobacco. He drinks 1 beer at nights to help with tremors. He does not use drugs.   Allergies  Allergen Reactions  . Atorvastatin Other (See Comments)    Unknown reaction  . Colesevelam Other (See Comments)    Unknown reaction  . Simvastatin Other (See Comments)    Unknown reaction  . Sulfa Antibiotics Other (See Comments)    Unknown reaction    Medications Prior to Admission  Medication Sig Dispense Refill  . aspirin  EC 81 MG tablet Take 81 mg by mouth daily.    Marland Kitchen atenolol (TENORMIN) 25 MG tablet Take 1 tablet (25 mg total) by mouth daily. 90 tablet 3  . Cholecalciferol (VITAMIN D3) 75 MCG (3000 UT) TABS Take 3,000 Units by mouth daily.    Marland Kitchen ezetimibe (ZETIA) 10 MG tablet Take 1 tablet (10 mg total) by mouth daily. 90 tablet 3  . losartan (COZAAR) 25 MG tablet Take 1 tablet (25 mg total) by mouth daily. 90 tablet 3  . meclizine (ANTIVERT) 12.5 MG tablet 1 po tid prn (Patient taking differently: Take 12.5 mg by mouth daily. ) 60 tablet 1  . furosemide (LASIX) 20 MG tablet Take 1 tablet (20 mg total) by mouth daily. (Patient not taking: Reported on 09/14/2018) 30 tablet 11    Drug Regimen Review  Drug regimen was reviewed and remains appropriate with no significant issues identified  Home: Home Living Family/patient expects to be discharged to:: Private residence Living Arrangements: Children Available Help at Discharge: Family, Available 24 hours/day Type of Home: House Home Access: Ramped entrance Home Layout: One level Bathroom Shower/Tub: Multimedia programmer: Handicapped height Bathroom Accessibility: Yes Home Equipment: Environmental consultant - 2 wheels, Cane - quad, Wheelchair - power, Electronics engineer Comments: daughter providing history as pt with impaired speech  Lives With: Daughter   Functional History: Prior Function Level of Independence: Independent Comments: pt driving, no use of assistive devices, independent with all ADLs  Functional Status:  Mobility: Bed Mobility Overal bed mobility: Needs Assistance Bed Mobility: Supine to Sit Rolling: Mod assist, +2 for physical assistance Sidelying to sit: +2 for physical assistance, Max assist Supine to sit: +2 for physical assistance, Max assist Sit to sidelying: Mod assist, +2 for safety/equipment General bed mobility comments: pt able to bring L LE to EOB with multimocal cues; assist to bring R LE and hips to EOB and then to elevate  trunk into sitting; pt using L UE to assist Transfers Overall transfer level: Needs assistance Equipment used: None Transfers: Sit to/from Stand, Squat Pivot Transfers Sit to Stand: +2 physical assistance, Mod assist, From elevated surface Squat pivot transfers: Max assist, +2 physical assistance General transfer comment: pt able to stand upright briefly and then with flexed posture; able to take 2 very short steps toward chair with L LE; +2 required for balance, mobilizing R LE, and to guard hips       ADL: ADL Overall ADL's : Needs assistance/impaired Eating/Feeding: NPO, Sitting Grooming: Moderate assistance, Sitting Upper Body Bathing: Moderate assistance, Sitting Lower Body Bathing: Maximal assistance, +2 for physical assistance, +2 for safety/equipment, Sit to/from stand Upper Body Dressing : Moderate assistance, Sitting Lower Body Dressing: Maximal assistance, +2 for physical assistance, +2 for safety/equipment, Sit to/from stand Toilet Transfer: Maximal assistance, +2 for physical assistance, +2 for safety/equipment, Stand-pivot(simulated to recliner) Functional mobility during ADLs: Maximal assistance, +2 for physical assistance, +2 for safety/equipment(stand pivot) General ADL Comments: Pt presenting with decreased balance, awareness of right side, functional use  of RUE, and cognition.   Cognition: Cognition Overall Cognitive Status: Impaired/Different from baseline Arousal/Alertness: Awake/alert Orientation Level: Oriented to person, Disoriented to situation, Disoriented to time, Disoriented to place Cognition Arousal/Alertness: Awake/alert Behavior During Therapy: Flat affect Overall Cognitive Status: Impaired/Different from baseline Area of Impairment: Following commands, Safety/judgement, Problem solving Current Attention Level: Focused Memory: Decreased short-term memory Following Commands: Follows one step commands with increased time Safety/Judgement: Decreased  awareness of safety, Decreased awareness of deficits Awareness: Intellectual Problem Solving: Slow processing, Difficulty sequencing, Requires verbal cues, Requires tactile cues, Decreased initiation General Comments: pt able to state name and recongnizes daughter (unable to state daughter's name) and answering with short phrases which daughter reports is an improvement  Difficult to assess due to: Impaired communication   Blood pressure 139/66, pulse (!) 57, temperature 98.6 F (37 C), temperature source Oral, resp. rate 20, height 5\' 11"  (1.803 m), weight 97.6 kg, SpO2 100 %. Physical Exam  Nursing note and vitals reviewed. Constitutional: He appears well-developed and well-nourished.  obese  HENT:  Healing abrasion right temple  Eyes: Pupils are equal, round, and reactive to light. EOM are normal.  Cardiovascular: Normal rate.  No murmur heard. Respiratory: Effort normal. No respiratory distress. He has no wheezes.  GI: He exhibits distension. There is no abdominal tenderness.  Musculoskeletal:        General: No deformity or edema.     Cervical back: Normal range of motion.  Neurological: He is alert.  Verbal output limited--very slow to respond with garbled speech and "yes" to most questions. He had difficulty following simple motor commands consistently with verbal and tactile cues. RUE with emerging tone and extensor tone RLE. Moves LUE and LLE more automatically. Assisted with transfer using left side. .   Skin: Skin is warm.  Psychiatric:  Flat, slow to engage    Results for orders placed or performed during the hospital encounter of 09/04/19 (from the past 48 hour(s))  Glucose, capillary     Status: Abnormal   Collection Time: 09/07/19  9:12 PM  Result Value Ref Range   Glucose-Capillary 103 (H) 70 - 99 mg/dL    Comment: Glucose reference range applies only to samples taken after fasting for at least 8 hours.   Comment 1 Notify RN    Comment 2 Document in Chart   CBC      Status: Abnormal   Collection Time: 09/08/19  3:26 AM  Result Value Ref Range   WBC 7.9 4.0 - 10.5 K/uL   RBC 4.39 4.22 - 5.81 MIL/uL   Hemoglobin 13.5 13.0 - 17.0 g/dL   HCT 39.5 39.0 - 52.0 %   MCV 90.0 80.0 - 100.0 fL   MCH 30.8 26.0 - 34.0 pg   MCHC 34.2 30.0 - 36.0 g/dL   RDW 13.2 11.5 - 15.5 %   Platelets 77 (L) 150 - 400 K/uL    Comment: SPECIMEN CHECKED FOR CLOTS CONSISTENT WITH PREVIOUS RESULT Immature Platelet Fraction may be clinically indicated, consider ordering this additional test [id number] REPEATED TO VERIFY    nRBC 0.0 0.0 - 0.2 %    Comment: Performed at [facility], [place] [address] [place], [place] [id number]  Basic metabolic panel     Status: Abnormal   Collection Time: 09/08/19  3:26 AM  Result Value Ref Range   Sodium 137 135 - 145 mmol/L   Potassium 3.6 3.5 - 5.1 mmol/L   Chloride 109 98 - 111 mmol/L   CO2 23 22 -  32 mmol/L   Glucose, Bld 118 (H) 70 - 99 mg/dL    Comment: Glucose reference range applies only to samples taken after fasting for at least 8 hours.   BUN 21 8 - 23 mg/dL   Creatinine, Ser 1.37 (H) 0.61 - 1.24 mg/dL   Calcium 8.2 (L) 8.9 - 10.3 mg/dL   GFR calc non Af Amer 44 (L) >60 mL/min   GFR calc Af Amer 51 (L) >60 mL/min   Anion gap 5 5 - 15    Comment: Performed at [facility] [address], [place], [place] [address]  Glucose, capillary     Status: Abnormal   Collection Time: 09/08/19  6:03 AM  Result Value Ref Range   Glucose-Capillary 109 (H) 70 - 99 mg/dL    Comment: Glucose reference range applies only to samples taken after fasting for at least 8 hours.   Comment 1 Notify RN    Comment 2 Document in Chart   Glucose, capillary     Status: None   Collection Time: 09/08/19 12:08 PM  Result Value Ref Range   Glucose-Capillary 96 70 - 99 mg/dL    Comment: Glucose reference range applies only to samples taken after fasting for at least 8 hours.  Glucose, capillary     Status: Abnormal   Collection Time:  09/08/19  4:24 PM  Result Value Ref Range   Glucose-Capillary 100 (H) 70 - 99 mg/dL    Comment: Glucose reference range applies only to samples taken after fasting for at least 8 hours.  CBC     Status: Abnormal   Collection Time: 09/09/19  2:59 AM  Result Value Ref Range   WBC 7.0 4.0 - 10.5 K/uL   RBC 4.34 4.22 - 5.81 MIL/uL   Hemoglobin 13.4 13.0 - 17.0 g/dL   HCT 39.6 39.0 - 52.0 %   MCV 91.2 80.0 - 100.0 fL   MCH 30.9 26.0 - 34.0 pg   MCHC 33.8 30.0 - 36.0 g/dL   RDW 13.2 11.5 - 15.5 %   Platelets 73 (L) 150 - 400 K/uL    Comment: CONSISTENT WITH PREVIOUS RESULT Immature Platelet Fraction may be clinically indicated, consider ordering this additional test [id number] REPEATED TO VERIFY    nRBC 0.0 0.0 - 0.2 %    Comment: Performed at [facility], [place] [address], [place],  [id number]  Basic metabolic panel     Status: Abnormal   Collection Time: 09/09/19  2:59 AM  Result Value Ref Range   Sodium 137 135 - 145 mmol/L   Potassium 3.7 3.5 - 5.1 mmol/L   Chloride 110 98 - 111 mmol/L   CO2 21 (L) 22 - 32 mmol/L   Glucose, Bld 103 (H) 70 - 99 mg/dL    Comment: Glucose reference range applies only to samples taken after fasting for at least 8 hours.   BUN 17 8 - 23 mg/dL   Creatinine, Ser 1.22 0.61 - 1.24 mg/dL   Calcium 8.1 (L) 8.9 - 10.3 mg/dL   GFR calc non Af Amer 50 (L) >60 mL/min   GFR calc Af Amer 58 (L) >60 mL/min   Anion gap 6 5 - 15    Comment: Performed at [facility] [address], [place], [place] [address]  Glucose, capillary     Status: None   Collection Time: 09/09/19  6:29 AM  Result Value Ref Range   Glucose-Capillary 91 70 - 99 mg/dL  Comment: Glucose reference range applies only to samples taken after fasting for at least 8 hours.   Comment 1 Notify RN    Comment 2 Document in Chart   Glucose, capillary     Status: Abnormal   Collection Time: 09/09/19 12:46 PM  Result Value Ref Range   Glucose-Capillary 127 (H) 70 - 99 mg/dL     Comment: Glucose reference range applies only to samples taken after fasting for at least 8 hours.   Comment 1 Notify RN    Comment 2 Document in Chart   Glucose, capillary     Status: Abnormal   Collection Time: 09/09/19  4:18 PM  Result Value Ref Range   Glucose-Capillary 102 (H) 70 - 99 mg/dL    Comment: Glucose reference range applies only to samples taken after fasting for at least 8 hours.   Comment 1 Notify RN    Comment 2 Document in Chart    No results found.     Medical Problem List and Plan: 1.  Functional, mobility and cognitive deficits secondary to right basal ganglia hemorrhage  -patient may  shower  -ELOS/Goals: 22-28 days/min assist with PT, OT, SLP 2.  Antithrombotics: -DVT/anticoagulation:  Mechanical: Sequential compression devices, below knee Bilateral lower extremities--platelets <100  -antiplatelet therapy: N/A 3. Chronic LBP/Pain Management:  Tylenol prn.  4. Mood: LCSW to follow for evaluation and support.   -antipsychotic agents: N/A 5. Neuropsych: This patient is not capable of making decisions on his own behalf. 6. Skin/Wound Care: Routine pressure relief measures.  7. Fluids/Electrolytes/Nutrition: Monitor I/O--check lytes in am.  8. HTN: Monitor tid--currently controlled off medications.  SBP goal< 140--will add prn catapres 9. H/o COPD: Stable without meds.  10. Impaired glucose tolerance: Monitor with ac/hs cbg checks and use SSI as indicated.  11. CAD/AVR: Off ASA due to bleed. No statin due to intolerance. Tenormin, cozaar and lasix (used prn) on hold.  12. Thrombocytopenia: Continue to follow for stability as trending down. Will monitor for signs of bleeding and for recovery.  13. AKI: Improved with gentle hydration. Re-check labs on admit  -IVF hs 14. Dysphagia: On dysphagia 1, honey liquids--will add IVF for hydration at nights.  15. Constipation: Will add Senna at bedtime as no BM since admission.       Bary Leriche, PA-C 09/09/2019

## 2019-09-09 NOTE — Progress Notes (Signed)
Pt arrived around 2100. He was sleeping and was alert and oriented to name and knows he is in the hospital. Speech is slurred (Baseline), pt seems confused at times. No signs of distress. Vitals stable.

## 2019-09-09 NOTE — Progress Notes (Signed)
Cristina Gong, RN  Rehab Admission Coordinator  Physical Medicine and Rehabilitation  PMR Pre-admission     Signed  Date of Service:  09/09/2019  2:10 PM      Related encounter: ED to Hosp-Admission (Current) from 09/04/2019 in [facility]      Signed        Show:Clear all [x] Manual[x] Template[x] Copied  Added by: [x] Cristina Gong, RN  [] Hover for details PMR Admission Coordinator Pre-Admission Assessment   Patient: David [name] is an 86 y.o., male MRN: [id number] DOB: [birth year] Height: 5\' 11"  (180.3 cm) Weight: 97.6 kg                                                                                                                                                  Insurance Information HMO:  PPO:      PCP:      IPA:      80/20:      OTHER:  PRIMARY: Medicare a and b     Policy#: [id number]      Subscriber: pt Benefits:  Phone #: passport one online     Name: 4/26 Eff. Date: 07/15/1990     Deduct: $1484      Out of Pocket Max: none      Life Max: none  CIR: 100%      SNF: 20 full days Outpatient: 80%   Co-Pay: 20% Home Health: 100%      Co-Pay: none DME: 80%     Co-Pay: 20% Providers: pt choice  SECONDARY: [employer]      Policy#: [id number]      Phone#: pt   Financial Counselor:       Phone#:    The "Data Collection Information Summary" for patients in Inpatient Rehabilitation Facilities with attached "Privacy Act [place] Records" was provided and verbally reviewed with: Patient and Family   Emergency Contact Information Contact Information     Name Relation Home Work Mobile    [place] Daughter [phone]   [phone]    Jakoby, [place]   [phone]   [phone]       Current Medical History  Patient Admitting Diagnosis: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 male with history of CAD, COPD, LBP,  Tremors, AVR; who was admitted on  09/04/2019 with right facial weakness, dysarthria, right hemiparesis, left gaze preference.  History taken from daughter and son due to patient's aphasia and lethargy.  Plans are for patient to return home with daughter who can only provide limited physical assistance at discharge.  Daughter had left for MD appointment and when she returned about a hour later, he was found lying on the floor between the beds. EMS was activated and CT head done showing acute left ICH.  Per report, acute  ICH left lentiform nucleus and chronic small vessel disease.  CTA head/neck done revealing no evidence of vascular malformation and left basal ganglia hemorrhage negative for spot sign.  Follow-up CT head personally reviewed showing stable moderate sized hemorrhage.  MRI/MRA brain done 4/22 showing stable acute left basal ganglia hemorrhage and remote bilateral basal ganglia lacunar infarcts with advanced white matter disease.  Bleed felt to be due to small vessel disease versus thrombocytopenia.  Platelets were 93 at admission   Echocardiogram showing ejection fraction of 60-65% with mild left ventricular hypertrophy and bioprosthetic aortic valve as well as 1.1 cm vegetation attached to RV site of pulmonic valve.  TEE ordered for further evaluation.  Hospital was further complicated by aphasia.  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 Diet upgraded to Dysphagia 1 with honey thick liquids with meds crushed with puree.  Patient with limitations in balance, BLE weakness with strong right lean as well as visual impairments affecting ADLs and mobility.     Complete NIHSS TOTAL: 19 Glasgow Coma Scale Score: 15   Past Medical History      Past Medical History:  Diagnosis Date  . CAD (coronary artery disease)      intolerance of statins  . COPD (chronic obstructive pulmonary disease) ([place])    . Current use of long term  anticoagulation    . GERD (gastroesophageal reflux disease)    . Hyperlipidemia    . Hypertension    . Impaired glucose tolerance 02/14/2011  . LBP (low back pain)    . OA (osteoarthritis)    . S/P AVR (aortic valve replacement) 2009    bonine valve      Family History  family history includes Coronary artery disease in an other family member; Hypertension in an other family member.   Prior Rehab/Hospitalizations:  Has the patient had prior rehab or hospitalizations prior to admission? Yes   Has the patient had major surgery during 100 days prior to admission? No   Current Medications    Current Facility-Administered Medications:  .  acetaminophen (TYLENOL) tablet 650 mg, 650 mg, Oral, Q4H PRN, 650 mg at 09/08/19 0741 **OR** [DISCONTINUED] acetaminophen (TYLENOL) 160 MG/5ML solution 650 mg, 650 mg, Per Tube, Q4H PRN **OR** [DISCONTINUED] acetaminophen (TYLENOL) suppository 650 mg, 650 mg, Rectal, Q4H PRN, Marliss Coots, PA-C .  bisacodyl (DULCOLAX) suppository 10 mg, 10 mg, Rectal, Daily PRN, Cherene Altes, MD .  chlorhexidine (PERIDEX) 0.12 % solution 15 mL, 15 mL, Mouth Rinse, BID, Rosalin Hawking, MD, 15 mL at 09/09/19 0932 .  Chlorhexidine Gluconate Cloth 2 % PADS 6 each, 6 each, Topical, Daily, Rosalin Hawking, MD, 6 each at 09/06/19 [phone] .  hydrALAZINE (APRESOLINE) injection 5-10 mg, 5-10 mg, Intravenous, Q4H PRN, Rosalin Hawking, MD .  labetalol (NORMODYNE) injection 10-20 mg, 10-20 mg, Intravenous, Q2H PRN, Rosalin Hawking, MD .  lactulose (CHRONULAC) 10 GM/15ML solution 20 g, 20 g, Oral, BID PRN, Cherene Altes, MD, 20 g at 09/09/19 1043 .  MEDLINE mouth rinse, 15 mL, Mouth Rinse, q12n4p, Rosalin Hawking, MD, 15 mL at 09/09/19 1301 .  pantoprazole (PROTONIX) EC tablet 40 mg, 40 mg, Oral, QHS, Karren Cobble, RPH, 40 mg at 09/08/19 2151 .  senna-docusate (Senokot-S) tablet 1 tablet, 1 tablet, Oral, BID, Marliss Coots, PA-C, 1 tablet at 09/09/19 0932 .  sodium chloride flush (NS)  0.9 % injection 3 mL, 3 mL, Intravenous, Once, Tegeler, Harrell Gave  J, MD   Patients Current Diet:     Diet Order                      DIET - DYS 1 Room service appropriate? No; Fluid consistency: Honey Thick  Diet effective now                   Precautions / Restrictions Precautions Precautions: Fall Precaution Comments: pushing to R Restrictions Weight Bearing Restrictions: No    Has the patient had 2 or more falls or a fall with injury in the past year?No   Prior Activity Level Community (5-7x/wk): driving, independent   Prior Functional Level Prior Function Level of Independence: Independent Comments: pt driving, no use of assistive devices, independent with all ADLs   Self Care: Did the patient need help bathing, dressing, using the toilet or eating?  Independent   Indoor Mobility: Did the patient need assistance with walking from room to room (with or without device)? Independent   Stairs: Did the patient need assistance with internal or external stairs (with or without device)? Independent   Functional Cognition: Did the patient need help planning regular tasks such as shopping or remembering to take medications? Independent   Home Assistive Devices / Equipment Home Assistive Devices/Equipment: Eyeglasses Home Equipment: Environmental consultant - 2 wheels, Cane - quad, Wheelchair - power, Civil engineer, contracting   Prior Device Use: Indicate devices/aids used by the patient prior to current illness, exacerbation or injury? None of the above   Current Functional Level Cognition   Arousal/Alertness: Awake/alert Overall Cognitive Status: Impaired/Different from baseline Difficult to assess due to: Impaired communication Current Attention Level: Focused Orientation Level: Oriented to person Following Commands: Follows one step commands with increased time Safety/Judgement: Decreased awareness of safety, Decreased awareness of deficits General Comments: pt able to state name and  recongnizes daughter (unable to state daughter's name) and answering with short phrases which daughter reports is an improvement     Extremity Assessment (includes Sensation/Coordination)   Upper Extremity Assessment: RUE deficits/detail RUE Deficits / Details: Increased tone. Able to perform PROM.  RUE Coordination: decreased fine motor, decreased gross motor  Lower Extremity Assessment: Defer to PT evaluation RLE Deficits / Details: no AROM noted, pt with significant extensor tone at knee and hip LLE Deficits / Details: LLE WFL     ADLs   Overall ADL's : Needs assistance/impaired Eating/Feeding: NPO, Sitting Grooming: Moderate assistance, Sitting Upper Body Bathing: Moderate assistance, Sitting Lower Body Bathing: Maximal assistance, +2 for physical assistance, +2 for safety/equipment, Sit to/from stand Upper Body Dressing : Moderate assistance, Sitting Lower Body Dressing: Maximal assistance, +2 for physical assistance, +2 for safety/equipment, Sit to/from stand Toilet Transfer: Maximal assistance, +2 for physical assistance, +2 for safety/equipment, Stand-pivot(simulated to recliner) Functional mobility during ADLs: Maximal assistance, +2 for physical assistance, +2 for safety/equipment(stand pivot) General ADL Comments: Pt presenting with decreased balance, awareness of right side, functional use of RUE, and cognition.      Mobility   Overal bed mobility: Needs Assistance Bed Mobility: Supine to Sit Rolling: Mod assist, +2 for physical assistance Sidelying to sit: +2 for physical assistance, Max assist Supine to sit: +2 for physical assistance, Max assist Sit to sidelying: Mod assist, +2 for safety/equipment General bed mobility comments: pt able to bring L LE to EOB with multimocal cues; assist to bring R LE and hips to EOB and then to elevate trunk into sitting; pt using L UE to assist  Transfers   Overall transfer level: Needs assistance Equipment used: None Transfers:  Sit to/from Stand, Squat Pivot Transfers Sit to Stand: +2 physical assistance, Mod assist, From elevated surface Squat pivot transfers: Max assist, +2 physical assistance General transfer comment: pt able to stand upright briefly and then with flexed posture; able to take 2 very short steps toward chair with L LE; +2 required for balance, mobilizing R LE, and to guard hips      Ambulation / Gait / Stairs / Wheelchair Mobility         Posture / Balance Dynamic Sitting Balance Sitting balance - Comments: pt able to maintain sitting EOB with min guard for safety; pt using L UE to hold onto bed rail  Balance Overall balance assessment: Needs assistance Sitting-balance support: Feet supported, Single extremity supported Sitting balance-Leahy Scale: Poor Sitting balance - Comments: pt able to maintain sitting EOB with min guard for safety; pt using L UE to hold onto bed rail  Postural control: Right lateral lean, Posterior lean Standing balance support: Single extremity supported Standing balance-Leahy Scale: Zero Standing balance comment: unable to get all the way upright today     Special needs/care consideration Diabetic management with Hgb A1c 5.9 Bladder management with external catheter Bowel incontinence Designated visitors are Narda Rutherford and [facility]    Previous Home Environment  Living Arrangements: Children  Lives With: Daughter Available Help at Discharge: Family, Available 24 hours/day Type of Home: House Home Layout: One level Home Access: Ramped entrance Bathroom Shower/Tub: Multimedia programmer: Handicapped height Bathroom Accessibility: Yes How Accessible: Accessible via walker Home Care Services: No Additional Comments: daughter providing history as pt with impaired speech   Discharge Living Setting Plans for Discharge Living Setting: Patient's home, Lives with (comment)(daughter) Type of Home at Discharge: House Discharge Home Layout: One level Discharge  Home Access: Ramped entrance Discharge Bathroom Shower/Tub: Walk-in shower Discharge Bathroom Toilet: Handicapped height Discharge Bathroom Accessibility: Yes How Accessible: Accessible via walker Does the patient have any problems obtaining your medications?: No   Social/Family/Support Systems Patient Roles: Parent Contact Information: daughter, Narda Rutherford Anticipated Caregiver: family Anticipated Caregiver's Contact Information: see above Ability/Limitations of Caregiver: min assist Caregiver Availability: 24/7 Discharge Plan Discussed with Primary Caregiver: Yes Is Caregiver In Agreement with Plan?: Yes Does Caregiver/Family have Issues with Lodging/Transportation while Pt is in Rehab?: No   Goals Patient/Family Goal for Rehab: Min PT, OT, and SLP Expected length of stay: ELOS 22 to 25 days Pt/Family Agrees to Admission and willing to participate: Yes Program Orientation Provided & Reviewed with Pt/Caregiver Including Roles  & Responsibilities: Yes   Decrease burden of Care through IP rehab admission: n/a   Possible need for SNF placement upon discharge:Family and patient wish to avoid SNF if possible   Patient Condition: This patient's medical and functional status has changed since the consult dated: 09/06/2019 in which the Rehabilitation Physician determined and documented that the patient's condition is appropriate for intensive rehabilitative care in an inpatient rehabilitation facility. See "History of Present Illness" (above) for medical update. Functional changes are: max assist. Patient's medical and functional status update has been discussed with the Rehabilitation physician and patient remains appropriate for inpatient rehabilitation. Will admit to inpatient rehab today.   Preadmission Screen Completed By:  Cleatrice Burke, RN, 09/09/2019 3:41 PM ______________________________________________________________________   Discussed status with Dr. Naaman Plummer on 09/09/2019 at   1542 and received approval for admission today.   Admission Coordinator:  Cleatrice Burke, time [id number] Date 09/09/2019  Cosigned by: Meredith Staggers, MD at 09/09/2019  4:07 PM  Revision History

## 2019-09-09 NOTE — Progress Notes (Signed)
  Speech Language Pathology Treatment: Cognitive-Linquistic  Patient Details Name: David [name] MRN: [id number] DOB: 04-04-1924 Today's Date: 09/09/2019 Time: [id number] SLP Time Calculation (min) (ACUTE ONLY): 12 min  Assessment / Plan / Recommendation Clinical Impression  Pt sitting in recliner, dtr present.  No POs offered - pt apparently had a significant coughing spell when taking his meds just prior.  Communication notably improved today: pt with improved ability to follow simple, contextual commands with intermittent visual cues.  He was able to name the DOW intelligibly and in proper sequence (unable to do so 4/23).  He answered simple questions with verbal cues, able to produce 2-3 word utterances about personal needs with accuracy on 4/5 opportunities.  SLP will continue to follow.     HPI HPI: 86 y.o. male with past medical history of status post AVR, impaired glucose tolerance, hypertension, hyperlipidemia, COPD and CAD admitted with acute intra-axial hemorrhage in left lentiform nuclei.        SLP Plan  Continue with current plan of care       Recommendations  Diet recommendations: Dysphagia 1 (puree);Honey-thick liquid Medication Administration: Crushed with puree Supervision: Full supervision/cueing for compensatory strategies                Oral Care Recommendations: Oral care BID Follow up Recommendations: Inpatient Rehab SLP Visit Diagnosis: Aphasia (R47.01) Plan: Continue with current plan of care       GO                [name] 09/09/2019, 12:19 PM  Kortni Hasten L. Tivis Ringer, [place] Office number [phone] Pager [phone]

## 2019-09-09 NOTE — Discharge Summary (Signed)
DISCHARGE SUMMARY  David [name]  MR#: [id number]  DOB:Dec 11, 1923  Date of Admission: 09/04/2019 Date of Discharge: 09/09/2019  Attending Physician:Deshauna Cayson Hennie Duos, MD  Patient's QP:[id number], David Lacks, MD  Consults: Stroke Service  Disposition: D/C to CIR   Follow-up Appts: Follow-up Information    [facility]. Schedule an appointment as soon as possible for a visit in 4 week(s).   Specialty: Neurology Contact information: [address] [place] [phone]          Tests Needing Follow-up: -Recheck creatinine in approximately 5 days  Discharge Diagnoses: Left basal ganglia (lentiform nucleus) intracranial hemorrhage Pulmonic valve "vegetation" Acute kidney injury Status post bioprosthetic aortic valve replacement HTN HLD Thrombocytopenia Dysphagia  Initial presentation: 86 year old with a history of aortic valve replacement, impaired glucose tolerance, HTN, HLD, COPD, and CAD who presented to the hospital with dysarthria and aphasia accompanied by right-sided weakness right facial droop and a left gaze preference.  He was admitted to the stroke service and underwent extensive evaluation which ultimately revealed a left basal ganglia intracranial hemorrhage felt to be related to small vessel disease versus thrombocytopenia.  Hospital Course:  Left basal ganglia (lentiform nucleus) intracranial hemorrhage Felt to be related to small vessel disease versus thrombocytopenia -CT cervical spine without traumatic injury -CTa head and neck confirmed left basal ganglia ICH with no large vessel obstruction -MRI revealed no underlying lesions but did suggest old bilateral basal ganglia infarcts and small vessel disease -TTE noted an EF of 60-65% with no clear source of embolization appreciated - to f/u w/ Neurology as outpt  Pulmonic valve "vegetation" Noted on TTE - no fever or other clinical evidence to  suggest SBE - this is not a new issue but actually chronically has been followed by his cardiologist and it was not felt that further evaluation was presently indicated - pt to continued routine monitoring TTEs per Dr. Georgina Peer as outpt   Acute kidney injury Creatinine improving with gentle hydration  Status post bioprosthetic aortic valve replacement No clinical evidence of valvular dysfunction at this time -followed by routine echo as per Dr. Ellouise Newer  HTN Goal is to keep SBP less than 140 -currently well controlled  HLD LDL noted to be 138 during this admission -intolerant to statin -resume Zetia   Thrombocytopenia Following platelet count which is holding steady  Dysphagia Due to hemorrhagic stroke -SLP following -diet able to be advanced somewhat by SLP therefore feeding tube was able to be avoided  Allergies  Allergen Reactions  . Atorvastatin Other (See Comments)    Unknown reaction  . Colesevelam Other (See Comments)    Unknown reaction  . Simvastatin Other (See Comments)    Unknown reaction  . Sulfa Antibiotics Other (See Comments)    Unknown reaction    Current Facility-Administered Medications:  .  acetaminophen (TYLENOL) tablet 650 mg, 650 mg, Oral, Q4H PRN, 650 mg at 09/08/19 0741 **OR** [DISCONTINUED] acetaminophen (TYLENOL) 160 MG/5ML solution 650 mg, 650 mg, Per Tube, Q4H PRN **OR** [DISCONTINUED] acetaminophen (TYLENOL) suppository 650 mg, 650 mg, Rectal, Q4H PRN, Marliss Coots, PA-C .  bisacodyl (DULCOLAX) suppository 10 mg, 10 mg, Rectal, Daily PRN, Joette Catching T, MD .  chlorhexidine (PERIDEX) 0.12 % solution 15 mL, 15 mL, Mouth Rinse, BID, Rosalin Hawking, MD, 15 mL at 09/09/19 0932 .  Chlorhexidine Gluconate Cloth 2 % PADS 6 each, 6 each, Topical, Daily, Rosalin Hawking, MD, 6 each at 09/06/19 0824 .  hydrALAZINE (APRESOLINE) injection 5-10  mg, 5-10 mg, Intravenous, Q4H PRN, Rosalin Hawking, MD .  labetalol (NORMODYNE) injection 10-20 mg, 10-20 mg,  Intravenous, Q2H PRN, Rosalin Hawking, MD .  lactulose (CHRONULAC) 10 GM/15ML solution 20 g, 20 g, Oral, BID PRN, Cherene Altes, MD, 20 g at 09/09/19 1043 .  MEDLINE mouth rinse, 15 mL, Mouth Rinse, q12n4p, Rosalin Hawking, MD, 15 mL at 09/09/19 1301 .  pantoprazole (PROTONIX) EC tablet 40 mg, 40 mg, Oral, QHS, Karren Cobble, RPH, 40 mg at 09/08/19 2151 .  senna-docusate (Senokot-S) tablet 1 tablet, 1 tablet, Oral, BID, Marliss Coots, PA-C, 1 tablet at 09/09/19 0932 .  sodium chloride flush (NS) 0.9 % injection 3 mL, 3 mL, Intravenous, Once, Tegeler, Gwenyth Allegra, MD  Day of Discharge BP 121/65 (BP Location: Right Arm)   Pulse (!) 43   Temp 98.4 F (36.9 C) (Oral)   Resp 19   Ht 5\' 11"  (1.803 m)   Wt 97.6 kg   SpO2 100%   BMI 30.01 kg/m   Physical Exam: General: No acute respiratory distress Lungs: Clear to auscultation bilaterally without wheezes or crackles Cardiovascular: Regular rate and rhythm without murmur gallop or rub normal S1 and S2 Abdomen: Nontender, nondistended, soft, bowel sounds positive, no rebound, no ascites, no appreciable mass Extremities: No significant cyanosis, clubbing, or edema bilateral lower extremities  Basic Metabolic Panel: Recent Labs  Lab 09/05/19 0812 09/06/19 0520 09/07/19 0438 09/08/19 0326 09/09/19 0259  NA 139 137 137 137 137  K 4.1 4.5 3.8 3.6 3.7  CL 107 107 106 109 110  CO2 22 17* 22 23 21*  GLUCOSE 92 85 120* 118* 103*  BUN 18 17 27* 21 17  CREATININE 1.34* 1.31* 1.51* 1.37* 1.22  CALCIUM 8.8* 8.4* 8.5* 8.2* 8.1*    Liver Function Tests: Recent Labs  Lab 09/04/19 1300 09/05/19 0812  AST 35 21  ALT 19 15  ALKPHOS 46 43  BILITOT 1.3* 1.4*  PROT 6.9 6.5  ALBUMIN 3.8 3.3*    Coags: Recent Labs  Lab 09/04/19 1300 09/06/19 0719  INR 1.1 1.2    CBC: Recent Labs  Lab 09/04/19 1300 09/04/19 1305 09/05/19 0812 09/06/19 0520 09/07/19 0438 09/08/19 0326 09/09/19 0259  WBC 8.4   < > 6.7 6.4 7.8 7.9 7.0   NEUTROABS 6.6  --   --   --   --   --   --   HGB 15.8   < > 14.9 15.9 14.2 13.5 13.4  HCT 47.7   < > 44.5 47.6 42.7 39.5 39.6  MCV 93.5   < > 91.0 95.2 90.7 90.0 91.2  PLT 85*   < > 89* 87* 78* 77* 73*   < > = values in this interval not displayed.    CBG: Recent Labs  Lab 09/08/19 0603 09/08/19 1208 09/08/19 1624 09/09/19 0629 09/09/19 1246  GLUCAP 109* 96 100* 91 127*    Recent Results (from the past 240 hour(s))  Respiratory Panel by RT PCR (Flu A&B, Covid) - Nasopharyngeal Swab     Status: None   Collection Time: 09/04/19  1:36 PM   Specimen: Nasopharyngeal Swab  Result Value Ref Range Status   SARS Coronavirus 2 by RT PCR NEGATIVE NEGATIVE Final    Comment: (NOTE) SARS-CoV-2 target nucleic acids are NOT DETECTED. The SARS-CoV-2 RNA is generally detectable in upper respiratoy specimens during the acute phase of infection. The lowest concentration of SARS-CoV-2 viral copies this assay can detect is 131 copies/mL. A negative  result does not preclude SARS-Cov-2 infection and should not be used as the sole basis for treatment or other patient management decisions. A negative result may occur with  improper specimen collection/handling, submission of specimen other than nasopharyngeal swab, presence of viral mutation(s) within the areas targeted by this assay, and inadequate number of viral copies (<131 copies/mL). A negative result must be combined with clinical observations, patient history, and epidemiological information. The expected result is Negative. Fact Sheet for Patients:  PinkCheek.be Fact Sheet for Healthcare Providers:  GravelBags.it This test is not yet ap proved or cleared by the [place] FDA and  has been authorized for detection and/or diagnosis of SARS-CoV-2 by FDA under an Emergency Use Authorization (EUA). This EUA will remain  in effect (meaning this test can be used) for the duration of  the COVID-19 declaration under Section 564(b)(1) of the Act, 21 U.S.C. section 360bbb-3(b)(1), unless the authorization is terminated or revoked sooner.    Influenza A by PCR NEGATIVE NEGATIVE Final   Influenza B by PCR NEGATIVE NEGATIVE Final    Comment: (NOTE) The Xpert Xpress SARS-CoV-2/FLU/RSV assay is intended as an aid in  the diagnosis of influenza from Nasopharyngeal swab specimens and  should not be used as a sole basis for treatment. Nasal washings and  aspirates are unacceptable for Xpert Xpress SARS-CoV-2/FLU/RSV  testing. Fact Sheet for Patients: PinkCheek.be Fact Sheet for Healthcare Providers: GravelBags.it This test is not yet approved or cleared by the [place] FDA and  has been authorized for detection and/or diagnosis of SARS-CoV-2 by  FDA under an Emergency Use Authorization (EUA). This EUA will remain  in effect (meaning this test can be used) for the duration of the  Covid-19 declaration under Section 564(b)(1) of the Act, 21  U.S.C. section 360bbb-3(b)(1), unless the authorization is  terminated or revoked. Performed at [facility], [place] [address] [place], [place] [address]   MRSA PCR Screening     Status: None   Collection Time: 09/04/19  3:46 PM   Specimen: Nasopharyngeal  Result Value Ref Range Status   MRSA by PCR NEGATIVE NEGATIVE Final    Comment:        The GeneXpert MRSA Assay (FDA approved for NASAL specimens only), is one component of a comprehensive MRSA colonization surveillance program. It is not intended to diagnose MRSA infection nor to guide or monitor treatment for MRSA infections. Performed at [facility], [place] [address], [place], [place] [address]   Culture, blood (routine x 2)     Status: None (Preliminary result)   Collection Time: 09/06/19 10:30 AM   Specimen: BLOOD RIGHT ARM  Result Value Ref Range Status   Specimen Description BLOOD RIGHT ARM   Final   Special Requests   Final    BOTTLES DRAWN AEROBIC AND ANAEROBIC Blood Culture adequate volume   Culture   Final    NO GROWTH 3 DAYS Performed at [facility], [address], [place], [place] [address]    Report Status PENDING  Incomplete  Culture, blood (routine x 2)     Status: None (Preliminary result)   Collection Time: 09/06/19 10:35 AM   Specimen: BLOOD LEFT HAND  Result Value Ref Range Status   Specimen Description BLOOD LEFT HAND  Final   Special Requests   Final    BOTTLES DRAWN AEROBIC ONLY Blood Culture results may not be optimal due to an inadequate volume of blood received in culture bottles   Culture  Final    NO GROWTH 3 DAYS Performed at [facility], [place] [address], [place], [place] [address]    Report Status PENDING  Incomplete     Time spent in discharge (includes decision making & examination of pt): 35 minutes  09/09/2019, 4:08 PM   Cherene Altes, MD [facility] Office  [phone]

## 2019-09-09 NOTE — Progress Notes (Signed)
David Arn, MD  Physician  Physical Medicine and Rehabilitation  Consult Note     Addendum  Date of Service:  09/06/2019  8:28 AM      Related encounter: ED to Hosp-Admission (Current) from 09/04/2019 in [place] [facility]      Expand AllCollapse All   Show:Clear all [x] Manual[x] Template[] Copied  Added by: [x] Love, Ivan Anchors, PA-C[x] David Arn, MD  [] Hover for details          Physical Medicine and Rehabilitation Consult     Reason for Consult: [place] with functional decline.  Referring Physician: Dr. Erlinda Hong   HPI: David [name] is a 86 y.o. male with history of CAD, COPD, LBP,  Tremors, AVR; who was admitted on 09/04/2019 with right facial weakness, dysarthria, right hemiparesis, left gaze preference.  History taken from daughter and son due to aphasia and lethargy.  Plans are for patient to return home with daughter who can only provide limited physical assistance at discharge.  Daughter had left for MD appointment and when she returned about a hour later, he was found lying on the floor between the beds. EMS was activated and CT head done showing acute left ICH.  Per report, acute ICH left lentiform nucleus and chronic small vessel disease.  CTA head/neck done revealing no evidence of vascular malformation and left basal ganglia hemorrhage negative for spot sign.  Follow-up CT head personally reviewed showing stable moderate sized hemorrhage.  MRI/MRA brain done 4/22 showing stable acute left basal ganglia hemorrhage and remote bilateral basal ganglia lacunar infarcts with advanced white matter disease.  Bleed felt to be due to small vessel disease versus thrombocytopenia.  Platelets were 93 at admission   Echocardiogram showing ejection fraction of 60-65% with mild left ventricular hypertrophy and bioprosthetic aortic valve as well as 1.1 cm vegetation attached to RV site of pulmonic valve.  TEE ordered for further evaluation.  Hospital was further complicated by  aphasia.  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  He remains n.p.o. at this time.  Patient with limitations in balance, BLE weakness with strong right lean as well as visual impairments affecting ADLs and mobility.  CIR recommended due to functional deficits.   Review of Systems  Unable to perform ROS: Mental acuity        Past Medical History:  Diagnosis Date  . CAD (coronary artery disease)      intolerance of statins  . COPD (chronic obstructive pulmonary disease) ([place])    . Current use of long term anticoagulation    . GERD (gastroesophageal reflux disease)    . Hyperlipidemia    . Hypertension    . Impaired glucose tolerance 02/14/2011  . LBP (low back pain)    . OA (osteoarthritis)    . S/P AVR (aortic valve replacement) 2009    bonine valve           Past Surgical History:  Procedure Laterality Date  . AORTIC VALVE REPLACEMENT   01/15/1999    bovine  . CARDIAC CATHETERIZATION   10/05/1998    Recommend CABG revascularizaition  . CARDIAC CATHETERIZATION   06/11/1999    Continue medical therapy  . CARDIAC CATHETERIZATION   01/08/2007    Patent grafts, recommend medical therapy  . CARDIOVASCULAR STRESS TEST   04/22/2010    Small lateral defect which is partially reversible, no ischemic EKG changes were noted.  Marland Kitchen CAROTID DOPPLER  04/22/2010    Bilateral ICAs-no evidence of diametere reduction, significant tortuosity, or any other vascular abnormality.  . CORONARY ARTERY BYPASS GRAFT   10/09/1998    LIMA to LAD, SVG to diag, SVG sequential to 2 marginals, SVG sequential to PDA & PLA. CE#25 porcine AVR.  Marland Kitchen TRANSTHORACIC ECHOCARDIOGRAM   05/14/2012    EF 50-55%, bioprosthesis of the aortic valve with well preserved LV function, moderate concentric Assumption           Family History  Problem Relation Age of Onset  . Coronary artery disease Other    . Hypertension Other         Social History:  Widowed in 2012 and daughter has been living with him since then. Retired IT consultant. He was independent prior to admission.  Per reports that he quit smoking about 21 years ago. His smoking use included pipe. He has quit using smokeless tobacco. Per reports has a beer at nights to help with tremors. He does not use use drugs.          Allergies  Allergen Reactions  . Atorvastatin Other (See Comments)      Unknown reaction  . Colesevelam Other (See Comments)      Unknown reaction  . Simvastatin Other (See Comments)      Unknown reaction  . Sulfa Antibiotics Other (See Comments)      Unknown reaction            Medications Prior to Admission  Medication Sig Dispense Refill  . aspirin EC 81 MG tablet Take 81 mg by mouth daily.      Marland Kitchen atenolol (TENORMIN) 25 MG tablet Take 1 tablet (25 mg total) by mouth daily. 90 tablet 3  . Cholecalciferol (VITAMIN D3) 75 MCG (3000 UT) TABS Take 3,000 Units by mouth daily.      Marland Kitchen ezetimibe (ZETIA) 10 MG tablet Take 1 tablet (10 mg total) by mouth daily. 90 tablet 3  . losartan (COZAAR) 25 MG tablet Take 1 tablet (25 mg total) by mouth daily. 90 tablet 3  . meclizine (ANTIVERT) 12.5 MG tablet 1 po tid prn (Patient taking differently: Take 12.5 mg by mouth daily. ) 60 tablet 1  . ciclopirox (PENLAC) 8 % solution Apply topically at bedtime. Apply over nail and surrounding skin. Apply daily over previous coat. After seven (7) days, may remove with alcohol and continue cycle. (Patient not taking: Reported on 09/04/2019) 6.6 mL 0  . diclofenac sodium (VOLTAREN) 1 % GEL Apply 4 g topically 4 (four) times daily. (Patient not taking: Reported on 09/04/2019) 100 g 0  . furosemide (LASIX) 20 MG tablet Take 1 tablet (20 mg total) by mouth daily. (Patient not taking: Reported on 09/14/2018) 30 tablet 11      Home: [place] expects to be discharged to:: Private residence Living Arrangements: Children Available Help at  Discharge: Family, Available 24 hours/day Type of Home: House Home Access: Ramped entrance [place]: One level Bathroom Shower/Tub: Multimedia programmer: Handicapped height [place]: Environmental consultant - 2 wheels, Cane - quad, Wheelchair - power, Electronics engineer Comments: daughter providing history as pt with impaired speech  Lives With: Daughter  Functional History: Prior Function Level of Independence: Independent Comments: pt driving, no use of assistive devices, independent with all ADLs Functional Status:  Mobility: Bed Mobility Overal bed mobility: Needs Assistance Bed Mobility: Supine to Sit Supine to sit: Mod assist, +2 for physical assistance General bed mobility comments: Mod A +  2 to elevate trunk and manage RLE Transfers Overall transfer level: Needs assistance Equipment used: 2 person hand held assist Transfers: Sit to/from Stand, Google Transfers Sit to Stand: Mod assist, +2 physical assistance, From elevated surface Squat pivot transfers: Mod assist, +2 physical assistance General transfer comment: Mod A +2 to power up into standing with block of RLE and use of pad to manage hips. pivot to right with Mod A +2; second person behind to manage hips   ADL: ADL Overall ADL's : Needs assistance/impaired Eating/Feeding: NPO, Sitting Grooming: Moderate assistance, Sitting Upper Body Bathing: Moderate assistance, Sitting Lower Body Bathing: Maximal assistance, +2 for physical assistance, +2 for safety/equipment, Sit to/from stand Upper Body Dressing : Moderate assistance, Sitting Lower Body Dressing: Maximal assistance, +2 for physical assistance, +2 for safety/equipment, Sit to/from stand Toilet Transfer: Maximal assistance, +2 for physical assistance, +2 for safety/equipment, Stand-pivot(simulated to recliner) Functional mobility during ADLs: Maximal assistance, +2 for physical assistance, +2 for safety/equipment(stand pivot) General ADL Comments: Pt  presenting with decreased balance, awareness of right side, functional use of RUE, and cognition.    Cognition: Cognition Overall Cognitive Status: Difficult to assess Arousal/Alertness: Awake/alert Orientation Level: Oriented to person, Disoriented to place, Disoriented to time, Disoriented to situation Cognition Arousal/Alertness: Awake/alert Behavior During Therapy: WFL for tasks assessed/performed Overall Cognitive Status: Difficult to assess Area of Impairment: Attention, Memory, Following commands, Safety/judgement, Awareness, Problem solving Current Attention Level: Sustained, Focused Memory: Decreased short-term memory Following Commands: Follows one step commands inconsistently, Follows one step commands with increased time Safety/Judgement: Decreased awareness of safety, Decreased awareness of deficits Awareness: Intellectual Problem Solving: Slow processing, Difficulty sequencing, Requires verbal cues General Comments: Pt following simple commands inconsistently. Inattentive to left. Poor attention and requiring increased cues. Decreased awreness of deficits and problem solving. Very motivated Difficult to assess due to: Impaired communication     Blood pressure (!) 148/84, pulse 82, temperature 98.6 F (37 C), temperature source Oral, resp. rate 17, height 5\' 11"  (1.803 m), weight 97.6 kg, SpO2 96 %. Physical Exam  Nursing note and vitals reviewed. Constitutional: He appears well-developed. He is easily aroused.  Morbid obesity  HENT:  Head: Normocephalic.  Resolving ecchymosis right temple.   Eyes: Right eye exhibits no discharge. Left eye exhibits no discharge.  Keeps eyes closed  Neck: No tracheal deviation present. No thyromegaly present.  Respiratory: Effort normal. No stridor. No respiratory distress.  GI: Soft. He exhibits no distension. There is no abdominal tenderness.  Musculoskeletal:     Comments: No edema or tenderness in extremities  Neurological: He is  alert and easily aroused.  Lethargic and somnolent Global aphasia Right facial weakness  Garbled unintelligible sounds  Motor: No spontaneous movement noted on right side  Skin:  See above Scattered ecchymosis on upper extremities  Psychiatric:  Unable to assess due to mentation      Lab Results Last 24 Hours       Results for orders placed or performed during the hospital encounter of 09/04/19 (from the past 24 hour(s))  Glucose, capillary     Status: None    Collection Time: 09/05/19 11:21 AM  Result Value Ref Range    Glucose-Capillary 87 70 - 99 mg/dL  Glucose, capillary     Status: None    Collection Time: 09/05/19  3:20 PM  Result Value Ref Range    Glucose-Capillary 85 70 - 99 mg/dL  Glucose, capillary     Status: None    Collection Time: 09/05/19 10:20 PM  Result Value Ref Range    Glucose-Capillary 88 70 - 99 mg/dL  Glucose, capillary     Status: None    Collection Time: 09/06/19 12:22 AM  Result Value Ref Range    Glucose-Capillary 85 70 - 99 mg/dL  Glucose, capillary     Status: None    Collection Time: 09/06/19  4:16 AM  Result Value Ref Range    Glucose-Capillary 74 70 - 99 mg/dL  CBC     Status: Abnormal    Collection Time: 09/06/19  5:20 AM  Result Value Ref Range    WBC 6.4 4.0 - 10.5 K/uL    RBC 5.00 4.22 - 5.81 MIL/uL    Hemoglobin 15.9 13.0 - 17.0 g/dL    HCT 47.6 39.0 - 52.0 %    MCV 95.2 80.0 - 100.0 fL    MCH 31.8 26.0 - 34.0 pg    MCHC 33.4 30.0 - 36.0 g/dL    RDW 13.5 11.5 - 15.5 %    Platelets 87 (L) 150 - 400 K/uL    nRBC 0.0 0.0 - 0.2 %  Basic metabolic panel     Status: Abnormal    Collection Time: 09/06/19  5:20 AM  Result Value Ref Range    Sodium 137 135 - 145 mmol/L    Potassium 4.5 3.5 - 5.1 mmol/L    Chloride 107 98 - 111 mmol/L    CO2 17 (L) 22 - 32 mmol/L    Glucose, Bld 85 70 - 99 mg/dL    BUN 17 8 - 23 mg/dL    Creatinine, Ser 1.31 (H) 0.61 - 1.24 mg/dL    Calcium 8.4 (L) 8.9 - 10.3 mg/dL    GFR calc non Af Amer 46 (L)  >60 mL/min    GFR calc Af Amer 54 (L) >60 mL/min    Anion gap 13 5 - 15  Protime-INR     Status: None    Collection Time: 09/06/19  7:19 AM  Result Value Ref Range    Prothrombin Time 14.7 11.4 - 15.2 seconds    INR 1.2 0.8 - 1.2  Glucose, capillary     Status: None    Collection Time: 09/06/19  7:56 AM  Result Value Ref Range    Glucose-Capillary 77 70 - 99 mg/dL       Imaging Results (Last 48 hours)  CT Code Stroke CTA Head W/WO contrast   Result Date: 09/04/2019 CLINICAL DATA:  Code stroke. 86 year old male with aphasia and left gaze, right weakness. Last seen normal 1130 hours. Acute left basal ganglia hemorrhage on plain CT. EXAM: CT ANGIOGRAPHY HEAD AND NECK TECHNIQUE: Multidetector CT imaging of the head and neck was performed using the standard protocol during bolus administration of intravenous contrast. Multiplanar CT image reconstructions and MIPs were obtained to evaluate the vascular anatomy. Carotid stenosis measurements (when applicable) are obtained utilizing NASCET criteria, using the distal internal carotid diameter as the denominator. CONTRAST:  126mL OMNIPAQUE IOHEXOL 350 MG/ML SOLN COMPARISON:  Head CT 1309 hours today. CT cervical spine today. FINDINGS: CTA NECK Skeleton: Cervical spine detailed separately. No acute osseous abnormality identified. Prior sternotomy. Upper chest: Mild upper lobe lung scarring. Trace retained secretions in the trachea at the thoracic inlet on series 5, image 146. Other neck: Negative. Aortic arch: Prior CABG. Calcified aortic atherosclerosis. 3 vessel arch configuration. Right carotid system: Tortuous brachiocephalic artery without stenosis despite plaque. Tortuous but otherwise normal right CCA origin. Negative right carotid bifurcation and cervical right ICA.  Left carotid system: No left CCA origin stenosis despite plaque. Tortuous proximal left CCA. Minimal calcified plaque at the left carotid bifurcation affecting the ECA origin. Negative  cervical left ICA. Vertebral arteries: Tortuous proximal right subclavian artery with a kinked appearance but no stenosis. Normal right vertebral artery origin on series 8, image 144. The right vertebral is intermittently tortuous but otherwise normal to the skull base. Proximal left subclavian artery plaque without stenosis. Tortuous subclavian at the thoracic inlet. Normal left vertebral artery origin. Tortuous left V1 segment. The left vertebral is dominant with tortuosity but no atherosclerosis or stenosis to the skull base. CTA HEAD Posterior circulation: Patent distal vertebral arteries without plaque or stenosis, dominant left V4. Normal PICA origins, although the left AICA appears dominant. Patent vertebrobasilar junction and basilar artery without stenosis. Normal SCA and PCA origins. Posterior communicating arteries are diminutive or absent. Bilateral PCA branches are within normal limits. Anterior circulation: Both ICA siphons are patent with tortuosity but only mild calcified plaque and no stenosis. Normal ophthalmic artery origins. Small infundibulum at both ICA termini, normal variant. Patent and mildly ectatic bilateral ICA terminus, MCA and ACA origins. Anterior communicating artery and bilateral ACA branches are within normal limits. Left MCA M1 segment bifurcates early without stenosis. Left MCA branches are normal aside from tortuosity. Right MCA M1 segment and bifurcation are patent without stenosis. Right MCA branches are tortuous but otherwise normal. Venous sinuses: The major dural venous sinuses appear to be patent, with better venous contrast timing on the delayed images. There is a small right parasagittal developmental venous anomaly incidentally noted (DVA normal variant series 8, image 216). Anatomic variants: Dominant left vertebral artery. Other findings: The left lentiform acute hemorrhage is negative for CTA spot sign. No abnormal vascularity is identified in that region. Delayed  postcontrast images are also obtained, with no abnormal enhancement identified. Review of the MIP images confirms the above findings IMPRESSION: 1. The left basal ganglia hemorrhage is negative for CTA spot sign, with no evidence of vascular malformation, and no abnormal enhancement. 2. CTA is negative for large vessel occlusion, with minimal for age atherosclerosis in the head and neck. There is generalized arterial tortuosity. 3. Prior CABG.  Aortic Atherosclerosis (ICD10-I70.0). Electronically Signed   By: Genevie Ann M.D.   On: 09/04/2019 13:37    CT HEAD WO CONTRAST   Result Date: 09/04/2019 CLINICAL DATA:  Follow-up intracranial hemorrhage. EXAM: CT HEAD WITHOUT CONTRAST TECHNIQUE: Contiguous axial images were obtained from the base of the skull through the vertex without intravenous contrast. COMPARISON:  09/04/2019 at 1:09 p.m. FINDINGS: Brain: The acute parenchymal hemorrhage involving the posterior left lentiform nucleus measures 4.4 x 2.5 x 2.9 cm (AP x transverse x craniocaudal) with an estimated volume of 16 mL, not significantly changed when remeasured in a similar fashion. Mild surrounding edema is unchanged with minimal regional mass effect and no midline shift. Separate from the hemorrhage, no acute infarct is identified. No extra-axial hemorrhage is evident. Hypodensities in the cerebral white matter bilaterally are nonspecific but compatible with mild chronic small vessel ischemic disease. A chronic lacunar infarct is again noted in the right lentiform nucleus/external capsule. Mild cerebral atrophy is not greater than expected for age. Vascular: Calcified atherosclerosis at the skull base. No hyperdense vessel. Skull: No fracture or suspicious osseous lesion. Sinuses/Orbits: Paranasal sinuses and mastoid air cells are clear. Bilateral cataract extraction. Other: None. IMPRESSION: 1. Unchanged acute posterior left basal ganglia hemorrhage with minimal edema. No significant mass effect. 2. Mild  chronic small vessel ischemic disease. Electronically Signed   By: Logan Bores M.D.   On: 09/04/2019 19:29    CT Code Stroke CTA Neck W/WO contrast   Result Date: 09/04/2019 CLINICAL DATA:  Code stroke. 86 year old male with aphasia and left gaze, right weakness. Last seen normal 1130 hours. Acute left basal ganglia hemorrhage on plain CT. EXAM: CT ANGIOGRAPHY HEAD AND NECK TECHNIQUE: Multidetector CT imaging of the head and neck was performed using the standard protocol during bolus administration of intravenous contrast. Multiplanar CT image reconstructions and MIPs were obtained to evaluate the vascular anatomy. Carotid stenosis measurements (when applicable) are obtained utilizing NASCET criteria, using the distal internal carotid diameter as the denominator. CONTRAST:  143mL OMNIPAQUE IOHEXOL 350 MG/ML SOLN COMPARISON:  Head CT 1309 hours today. CT cervical spine today. FINDINGS: CTA NECK Skeleton: Cervical spine detailed separately. No acute osseous abnormality identified. Prior sternotomy. Upper chest: Mild upper lobe lung scarring. Trace retained secretions in the trachea at the thoracic inlet on series 5, image 146. Other neck: Negative. Aortic arch: Prior CABG. Calcified aortic atherosclerosis. 3 vessel arch configuration. Right carotid system: Tortuous brachiocephalic artery without stenosis despite plaque. Tortuous but otherwise normal right CCA origin. Negative right carotid bifurcation and cervical right ICA. Left carotid system: No left CCA origin stenosis despite plaque. Tortuous proximal left CCA. Minimal calcified plaque at the left carotid bifurcation affecting the ECA origin. Negative cervical left ICA. Vertebral arteries: Tortuous proximal right subclavian artery with a kinked appearance but no stenosis. Normal right vertebral artery origin on series 8, image 144. The right vertebral is intermittently tortuous but otherwise normal to the skull base. Proximal left subclavian artery plaque  without stenosis. Tortuous subclavian at the thoracic inlet. Normal left vertebral artery origin. Tortuous left V1 segment. The left vertebral is dominant with tortuosity but no atherosclerosis or stenosis to the skull base. CTA HEAD Posterior circulation: Patent distal vertebral arteries without plaque or stenosis, dominant left V4. Normal PICA origins, although the left AICA appears dominant. Patent vertebrobasilar junction and basilar artery without stenosis. Normal SCA and PCA origins. Posterior communicating arteries are diminutive or absent. Bilateral PCA branches are within normal limits. Anterior circulation: Both ICA siphons are patent with tortuosity but only mild calcified plaque and no stenosis. Normal ophthalmic artery origins. Small infundibulum at both ICA termini, normal variant. Patent and mildly ectatic bilateral ICA terminus, MCA and ACA origins. Anterior communicating artery and bilateral ACA branches are within normal limits. Left MCA M1 segment bifurcates early without stenosis. Left MCA branches are normal aside from tortuosity. Right MCA M1 segment and bifurcation are patent without stenosis. Right MCA branches are tortuous but otherwise normal. Venous sinuses: The major dural venous sinuses appear to be patent, with better venous contrast timing on the delayed images. There is a small right parasagittal developmental venous anomaly incidentally noted (DVA normal variant series 8, image 216). Anatomic variants: Dominant left vertebral artery. Other findings: The left lentiform acute hemorrhage is negative for CTA spot sign. No abnormal vascularity is identified in that region. Delayed postcontrast images are also obtained, with no abnormal enhancement identified. Review of the MIP images confirms the above findings IMPRESSION: 1. The left basal ganglia hemorrhage is negative for CTA spot sign, with no evidence of vascular malformation, and no abnormal enhancement. 2. CTA is negative for large  vessel occlusion, with minimal for age atherosclerosis in the head and neck. There is generalized arterial tortuosity. 3. Prior CABG.  Aortic Atherosclerosis (ICD10-I70.0). Electronically Signed  By: Genevie Ann M.D.   On: 09/04/2019 13:37    CT CERVICAL SPINE WO CONTRAST   Result Date: 09/04/2019 CLINICAL DATA:  Code stroke. 86 year old male with aphasia and left gaze, right weakness. Last seen normal 1130 hours. EXAM: CT CERVICAL SPINE WITHOUT CONTRAST TECHNIQUE: Multidetector CT imaging of the cervical spine was performed without intravenous contrast. Multiplanar CT image reconstructions were also generated. COMPARISON:  Head CT today reported separately. Thoracic spine CT 09/14/2018. FINDINGS: Alignment: Preserved cervical lordosis. Cervicothoracic junction alignment is within normal limits. Bilateral posterior element alignment is within normal limits. Skull base and vertebrae: Visualized skull base is intact. No atlanto-occipital dissociation. No acute osseous abnormality identified. Soft tissues and spinal canal: No prevertebral fluid or swelling. No visible canal hematoma. Negative noncontrast neck soft tissues. Disc levels: Widespread cervical spine degeneration, but mild if any cervical spinal stenosis, at C5-C6 and C6-C7. Upper chest: Visible upper thoracic levels appears stable and intact. Negative visible lung apices. IMPRESSION: 1.  No acute traumatic injury identified in the cervical spine. 2. Spine degeneration but mild if any cervical spinal stenosis. Electronically Signed   By: Genevie Ann M.D.   On: 09/04/2019 13:26    MR BRAIN WO CONTRAST   Result Date: 09/05/2019 CLINICAL DATA:  Left basal ganglia hemorrhage. EXAM: MRI HEAD WITHOUT CONTRAST TECHNIQUE: Multiplanar, multiecho pulse sequences of the brain and surrounding structures were obtained without intravenous contrast. COMPARISON:  CT head without contrast and CTA head and neck 09/04/2019. FINDINGS: Brain: Acute left basal ganglia hemorrhage  is stable, measuring 4.6 x 2.4 cm. Underlying mass lesion is evident. Remote lacunar infarcts in the basal ganglia bilaterally demonstrate peripheral hemosiderin rings. Remote nonhemorrhagic lacunar infarcts are present in the right caudate head. Confluent periventricular white matter changes are present bilaterally. No discrete mass present. The ventricles are of normal size. No significant extraaxial fluid collection is present. Vascular: Flow is present in the major intracranial arteries. Skull and upper cervical spine: The craniocervical junction is normal. Upper cervical spine is within normal limits. Marrow signal is unremarkable. Sinuses/Orbits: The paranasal sinuses and mastoid air cells are clear. Bilateral lens replacements are noted. Globes and orbits are otherwise unremarkable. IMPRESSION: 1. Stable acute left basal ganglia hemorrhage measuring 4.6 x 2.4 cm. No underlying mass lesion or vascular abnormality identified. 2. Remote lacunar infarcts of the basal ganglia bilaterally. 3. Confluent periventricular and subcortical white matter disease bilaterally is advanced for age. This likely reflects the sequela of chronic microvascular ischemia. Electronically Signed   By: San Morelle M.D.   On: 09/05/2019 07:03    DG Chest Port 1 View   Result Date: 09/04/2019 CLINICAL DATA:  Cough EXAM: PORTABLE CHEST 1 VIEW COMPARISON:  09/14/2018 FINDINGS: Prior CABG. Lingular atelectasis or scarring. No confluent opacity otherwise. No effusions. No acute bony abnormality. IMPRESSION: Left basilar scarring or atelectasis. No active disease. Electronically Signed   By: Rolm Baptise M.D.   On: 09/04/2019 19:29    ECHOCARDIOGRAM COMPLETE   Result Date: 09/05/2019    ECHOCARDIOGRAM REPORT   Patient Name:   David [name] Date of Exam: 09/05/2019 Medical Rec #:  [id number]      Height:       71.0 in Accession #:    [id number]     Weight:       215.2 lb Date of Birth:  February 28, 1924      BSA:          2.175 m  Patient Age:    60 years  BP:           114/52 mmHg Patient Gender: M              HR:           50 bpm. Exam Location:  Inpatient Procedure: 2D Echo, Cardiac Doppler, Color Doppler and Intracardiac            Opacification Agent                        STAT ECHO Reported to: Dr. Loralie Champagne on 09/05/2019 10:00:00 AM. Indications:    Stroke 434.91 / I163.9  History:        Patient has prior history of Echocardiogram examinations, most                 recent 12/25/2018. CAD, COPD, Aortic Valve Disease; Risk                 Factors:Hypertension, Dyslipidemia and GERD.                 Aortic Valve: valve is present in the aortic position.  Sonographer:    Jonelle Sidle Dance Referring Phys: Pimaco Two Comments: Suboptimal subcostal window. IMPRESSIONS  1. Left ventricular ejection fraction, by estimation, is 60 to 65%. The left ventricle has normal function. The left ventricle has no regional wall motion abnormalities. There is mild left ventricular hypertrophy. Left ventricular diastolic parameters are consistent with Grade I diastolic dysfunction (impaired relaxation).  2. Right ventricular systolic function is mildly reduced. The right ventricular size is mildly enlarged. Tricuspid regurgitation signal is inadequate for assessing PA pressure.  3. The mitral valve is normal in structure. No evidence of mitral valve regurgitation. No evidence of mitral stenosis.  4. Bioprosthetic aortic valve. No significant stenosis or regurgitation noted. The valve was not visualized well, cannot rule out vegetation. The annulus looks thickened, uncertain significance.  5. The pulmonic valve was abnormal. There is a 1.1 cm vegetation attached to the RV side of the pulmonic valve. Trivial PI, no pulmonic stenosis.  6. Aortic dilatation noted. There is mild dilatation of the aortic root measuring 42 mm.  7. The inferior vena cava is normal in size with greater than 50% respiratory variability, suggesting right  atrial pressure of 3 mmHg.  8. Would suggest TEE to assess pulmonic valve vegetation and also to assess more closely the aortic valve. FINDINGS  Left Ventricle: Left ventricular ejection fraction, by estimation, is 60 to 65%. The left ventricle has normal function. The left ventricle has no regional wall motion abnormalities. Definity contrast agent was given IV to delineate the left ventricular  endocardial borders. The left ventricular internal cavity size was normal in size. There is mild left ventricular hypertrophy. Left ventricular diastolic parameters are consistent with Grade I diastolic dysfunction (impaired relaxation). Right Ventricle: The right ventricular size is mildly enlarged. No increase in right ventricular wall thickness. Right ventricular systolic function is mildly reduced. Tricuspid regurgitation signal is inadequate for assessing PA pressure. Left Atrium: Left atrial size was normal in size. Right Atrium: Right atrial size was normal in size. Pericardium: There is no evidence of pericardial effusion. Mitral Valve: The mitral valve is normal in structure. There is mild calcification of the mitral valve leaflet(s). Mild mitral annular calcification. No evidence of mitral valve regurgitation. No evidence of mitral valve stenosis. Tricuspid Valve: The tricuspid valve is normal in structure. Tricuspid valve regurgitation is not demonstrated. Aortic  Valve: Bioprosthetic aortic valve. No significant stenosis or regurgitation noted. The valve was not visualized well, cannot rule out vegetation. The annulus looks thickened, uncertain significance. The aortic valve has been repaired/replaced. Aortic valve regurgitation is not visualized. No aortic stenosis is present. There is a valve present in the aortic position. Pulmonic Valve: The pulmonic valve was abnormal. Pulmonic valve regurgitation is trivial. No evidence of pulmonic stenosis. Aorta: Aortic dilatation noted. There is mild dilatation of the  aortic root measuring 42 mm. Venous: The inferior vena cava is normal in size with greater than 50% respiratory variability, suggesting right atrial pressure of 3 mmHg. IAS/Shunts: The atrial septum is grossly normal.  LEFT VENTRICLE PLAX 2D LVIDd:         3.50 cm  Diastology LVIDs:         2.90 cm  LV e' lateral:   6.42 cm/s LV PW:         1.30 cm  LV E/e' lateral: 16.2 LV IVS:        1.30 cm  LV e' medial:    4.79 cm/s LVOT diam:     1.80 cm  LV E/e' medial:  21.7 LV SV:         71 LV SV Index:   33 LVOT Area:     2.54 cm  RIGHT VENTRICLE            IVC RV Basal diam:  2.70 cm    IVC diam: 2.00 cm RV S prime:     6.85 cm/s TAPSE (M-mode): 1.4 cm LEFT ATRIUM             Index LA diam:        3.40 cm 1.56 cm/m LA Vol (A2C):   61.4 ml 28.23 ml/m LA Vol (A4C):   37.4 ml 17.19 ml/m LA Biplane Vol: 50.4 ml 23.17 ml/m  AORTIC VALVE LVOT Vmax:   82.80 cm/s LVOT Vmean:  63.300 cm/s LVOT VTI:    0.280 m  AORTA Ao Root diam: 4.00 cm Ao Asc diam:  4.10 cm MITRAL VALVE MV Area (PHT): 2.45 cm     SHUNTS MV Decel Time: 310 msec     Systemic VTI:  0.28 m MV E velocity: 104.00 cm/s  Systemic Diam: 1.80 cm MV A velocity: 115.00 cm/s MV E/A ratio:  0.90 Loralie Champagne MD Electronically signed by Loralie Champagne MD Signature Date/Time: 09/05/2019/2:50:35 PM    Final     CT HEAD CODE STROKE WO CONTRAST   Result Date: 09/04/2019 CLINICAL DATA:  Code stroke. 86 year old male with aphasia and left gaze, right weakness. Last seen normal 1130 hours. EXAM: CT HEAD WITHOUT CONTRAST TECHNIQUE: Contiguous axial images were obtained from the base of the skull through the vertex without intravenous contrast. COMPARISON:  Brain MRI 08/30/2003. FINDINGS: Brain: Oval, lentiform hyperdense hemorrhage epicenter in they left lentiform nuclei, with blood products encompassing 38 x 24 x 35 mm (AP by transverse by CC) for an estimated intra-axial blood volume of 16 mL. No intraventricular extension. No extra-axial extension identified. Mild  regional edema, minimal mass effect. No midline shift. Normal basilar cisterns. Normal ventricle size and configuration. Patchy bilateral white matter hypodensity including chronic appearing infarct of the right external capsule. No cortically based acute infarct identified. No other intracranial blood identified. Vascular: Mild Calcified atherosclerosis at the skull base. No suspicious intracranial vascular hyperdensity. Skull: Intact. Sinuses/Orbits: Visualized paranasal sinuses and mastoids are stable and well pneumatized. Other: No acute orbit or scalp soft tissue  finding. ASPECTS [facility] Stroke Program Early CT Score) Total score (0-10 with 10 being normal): Acute hemorrhage. IMPRESSION: 1. Acute intra-axial hemorrhage in the left lentiform nuclei. Estimated blood volume 16 mL. No intraventricular or extra-axial extension. No significant intracranial mass effect. 2. Evidence of underlying chronic small vessel disease. 3. Critical Value/emergent results were called by telephone at the time of interpretation on 09/04/2019 at 1:21 pm to Dr. Rory Percy, Neurology who verbally acknowledged these results. Electronically Signed   By: Genevie Ann M.D.   On: 09/04/2019 13:21       Assessment/Plan: Diagnosis: acute left basal ganglia hemorrhage and remote bilateral basal ganglia lacunar infarcts Stroke: Continue secondary stroke prophylaxis and Risk Factor Modification listed below:   Blood Pressure Management:  Continue current medication with prn's with permisive HTN per primary team Statin Agent:   Prediabetes management:   Left sided hemiparesis: fit for orthosis to prevent contractures (resting hand splint for day, wrist cock up splint at night, PRAFO, etc) PT/OT for mobility, ADL training  Motor recovery: Fluoxetine Labs and images (see above) independently reviewed.  Records reviewed and summated above.   1. Does the need for close, 24 hr/day medical supervision in concert with the patient's rehab needs make  it unreasonable for this patient to be served in a less intensive setting? Yes  2. Co-Morbidities requiring supervision/potential complications: CAD, COPD (monitor O2 sats and respiratory rate with increased exertion), LBP,  Tremors, AVR, Thrombocytopenia (repeat labs, < 60,000/mm3 no resistive exercise), global aphasia 3. Due to bladder management, bowel management, safety, skin/wound care, disease management, medication administration, pain management and patient education, does the patient require 24 hr/day rehab nursing? Yes 4. Does the patient require coordinated care of a physician, rehab nurse, therapy disciplines of PT/OT/SLP to address physical and functional deficits in the context of the above medical diagnosis(es)? Yes Addressing deficits in the following areas: balance, endurance, locomotion, strength, transferring, bowel/bladder control, bathing, dressing, feeding, grooming, toileting, cognition, speech, language, swallowing and psychosocial support 5. Can the patient actively participate in an intensive therapy program of at least 3 hrs of therapy per day at least 5 days per week? Potentially 6. The potential for patient to make measurable gains while on inpatient rehab is excellent 7. Anticipated functional outcomes upon discharge from inpatient rehab are min assist  with PT, min assist with OT, min assist with SLP. 8. Estimated rehab length of stay to reach the above functional goals is: 22-25 days. 9. Anticipated discharge destination: Home 10. Overall Rehab/Functional Prognosis: good   RECOMMENDATIONS: This patient's condition is appropriate for continued rehabilitative care in the following setting: CIR medically stable and medical work-up complete. Patient has agreed to participate in recommended program. Potentially Note that insurance prior authorization may be required for reimbursement for recommended care.   Comment: Rehab Admissions Coordinator to follow up.   I have  personally performed a face to face diagnostic evaluation, including, but not limited to relevant history and physical exam findings, of this patient and developed relevant assessment and plan.  Additionally, I have reviewed and concur with the physician assistant's documentation above.    Delice Lesch, MD, ABPMR Bary Leriche, PA-C 09/06/2019    Revision History                          Routing History

## 2019-09-09 NOTE — Plan of Care (Signed)
  Problem: Coping: Goal: Will verbalize positive feelings about self Outcome: Progressing Goal: Will identify appropriate support needs Outcome: Progressing   Problem: Self-Care: Goal: Verbalization of feelings and concerns over difficulty with self-care will improve Outcome: Progressing

## 2019-09-09 NOTE — TOC Transition Note (Signed)
Transition of Care [facility]) - CM/SW Discharge Note   Patient Details  Name: David [name] MRN: [id number] Date of Birth: 12/11/1923  [facility] CM/SW Contact:  Pollie Friar, RN Phone Number: 09/09/2019, 4:16 PM   Clinical Narrative:    Pt discharging to CIR today. CM signing off.    Final next level of care: IP Rehab Facility Barriers to Discharge: No Barriers Identified   Patient Goals and CMS Choice        Discharge Placement                       Discharge Plan and Services                                     Social Determinants of Health (SDOH) Interventions     Readmission Risk Interventions No flowsheet data found.

## 2019-09-09 NOTE — Progress Notes (Addendum)
Inpatient Rehabilitation Admissions Coordinator  I met with patient and his daughter at bedside. Patient more alert than on Friday. I await bed availability for admit to CIR. Daughter in agreement.  Danne Baxter, RN, MSN Rehab Admissions Coordinator [phone] 09/09/2019 2:25 PM   Inpt rehab bed is available today and Narda Rutherford , daughter in agreement to admit. I have notified Dr., Billee Cashing clung and acute team. I will make the arrangements to admit today.  Danne Baxter, RN, MSN Rehab Admissions Coordinator [phone] 09/09/2019 3:42 PM

## 2019-09-09 NOTE — H&P (Addendum)
Physical Medicine and Rehabilitation Admission H&P        Chief Complaint  Patient presents with  . Functional decline due to [place]      HPI:  David [name] is a 86 year old male with history of CAD, COPD, LBP, tremors, AVR who was admitted on 04/21/21with right facial weakness, dysarthria, right hemiparesis, and left gaze preference.  Daughter had left for MD appointment and when she returned an hour later she was lying on the floor between beds.  EMS was activated and CT head done showing acute left ICH.  CTA head/neck was negative for vascular malformation and left basal ganglia was negative for spot sign.  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  Bleed was felt to be due to small vessel disease versus thrombocytopenia as platelets with 93 at admission.   2D echo was done showing EF of 60 to 65% with mild LVH and bioprosthetic aortic valve as well as 1.1 cm with vegetation attached to the RV side of pulmonic valve.  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 Claiborne Billings.  Is patient without fevers or other clinical indication of SBE TEE was not felt to be indicated.  MBS was done on 423 showing moderate dysphagia with delayed swallow and trace aspiration of thins--he was placed on dysphagia 1, honey liquids by spoon.  Patient with resultant dysphagia, aphasia, delayed processing with difficulty following one-step commands BLE weakness with visual impairments affecting ADLs and mobility.  CIR was recommended due to functional decline.     Review of Systems  Unable to perform ROS: Language          Past Medical History:  Diagnosis Date  . CAD (coronary artery disease)      intolerance of statins  . COPD (chronic obstructive  pulmonary disease) ([place])    . Current use of long term anticoagulation    . GERD (gastroesophageal reflux disease)    . Hyperlipidemia    . Hypertension    . Impaired glucose tolerance 02/14/2011  . LBP (low back pain)    . OA (osteoarthritis)    . S/P AVR (aortic valve replacement) 2009    bonine valve           Past Surgical History:  Procedure Laterality Date  . AORTIC VALVE REPLACEMENT   01/15/1999    bovine  . CARDIAC CATHETERIZATION   10/05/1998    Recommend CABG revascularizaition  . CARDIAC CATHETERIZATION   06/11/1999    Continue medical therapy  . CARDIAC CATHETERIZATION   01/08/2007    Patent grafts, recommend medical therapy  . CARDIOVASCULAR STRESS TEST   04/22/2010    Small lateral defect which is partially reversible, no ischemic EKG changes were noted.  Marland Kitchen CAROTID DOPPLER   04/22/2010    Bilateral ICAs-no evidence of diametere reduction, significant tortuosity, or any other vascular abnormality.  . CORONARY ARTERY BYPASS GRAFT   10/09/1998    LIMA to LAD, SVG to diag, SVG sequential to 2 marginals, SVG sequential to PDA & PLA. CE#25 porcine AVR.  Marland Kitchen TRANSTHORACIC ECHOCARDIOGRAM   05/14/2012    EF 50-55%, bioprosthesis of the aortic valve with well preserved LV function, moderate concentric [facility]           Family  History  Problem Relation Age of Onset  . Coronary artery disease Other    . Hypertension Other        Social History: Widowed and daughter has been living with him.  Retired Printmaker.  Reports that he quit smoking about 21 years ago. His smoking use included pipe. He has quit using smokeless tobacco. He drinks 1 beer at nights to help with tremors. He does not use drugs.          Allergies  Allergen Reactions  . Atorvastatin Other (See Comments)      Unknown reaction  . Colesevelam Other (See Comments)      Unknown reaction  . Simvastatin Other (See Comments)      Unknown reaction  . Sulfa Antibiotics Other (See Comments)      Unknown  reaction            Medications Prior to Admission  Medication Sig Dispense Refill  . aspirin EC 81 MG tablet Take 81 mg by mouth daily.      Marland Kitchen atenolol (TENORMIN) 25 MG tablet Take 1 tablet (25 mg total) by mouth daily. 90 tablet 3  . Cholecalciferol (VITAMIN D3) 75 MCG (3000 UT) TABS Take 3,000 Units by mouth daily.      Marland Kitchen ezetimibe (ZETIA) 10 MG tablet Take 1 tablet (10 mg total) by mouth daily. 90 tablet 3  . losartan (COZAAR) 25 MG tablet Take 1 tablet (25 mg total) by mouth daily. 90 tablet 3  . meclizine (ANTIVERT) 12.5 MG tablet 1 po tid prn (Patient taking differently: Take 12.5 mg by mouth daily. ) 60 tablet 1  . furosemide (LASIX) 20 MG tablet Take 1 tablet (20 mg total) by mouth daily. (Patient not taking: Reported on 09/14/2018) 30 tablet 11      Drug Regimen Review  Drug regimen was reviewed and remains appropriate with no significant issues identified   Home: Home Living Family/patient expects to be discharged to:: Private residence Living Arrangements: Children Available Help at Discharge: Family, Available 24 hours/day Type of Home: House Home Access: Ramped entrance Home Layout: One level Bathroom Shower/Tub: Multimedia programmer: Handicapped height Bathroom Accessibility: Yes Home Equipment: Environmental consultant - 2 wheels, Cane - quad, Wheelchair - power, Electronics engineer Comments: daughter providing history as pt with impaired speech  Lives With: Daughter   Functional History: Prior Function Level of Independence: Independent Comments: pt driving, no use of assistive devices, independent with all ADLs   Functional Status:  Mobility: Bed Mobility Overal bed mobility: Needs Assistance Bed Mobility: Supine to Sit Rolling: Mod assist, +2 for physical assistance Sidelying to sit: +2 for physical assistance, Max assist Supine to sit: +2 for physical assistance, Max assist Sit to sidelying: Mod assist, +2 for safety/equipment General bed mobility comments:  pt able to bring L LE to EOB with multimocal cues; assist to bring R LE and hips to EOB and then to elevate trunk into sitting; pt using L UE to assist Transfers Overall transfer level: Needs assistance Equipment used: None Transfers: Sit to/from Stand, Squat Pivot Transfers Sit to Stand: +2 physical assistance, Mod assist, From elevated surface Squat pivot transfers: Max assist, +2 physical assistance General transfer comment: pt able to stand upright briefly and then with flexed posture; able to take 2 very short steps toward chair with L LE; +2 required for balance, mobilizing R LE, and to guard hips    ADL: ADL Overall ADL's : Needs assistance/impaired Eating/Feeding: NPO, Sitting Grooming:  Moderate assistance, Sitting Upper Body Bathing: Moderate assistance, Sitting Lower Body Bathing: Maximal assistance, +2 for physical assistance, +2 for safety/equipment, Sit to/from stand Upper Body Dressing : Moderate assistance, Sitting Lower Body Dressing: Maximal assistance, +2 for physical assistance, +2 for safety/equipment, Sit to/from stand Toilet Transfer: Maximal assistance, +2 for physical assistance, +2 for safety/equipment, Stand-pivot(simulated to recliner) Functional mobility during ADLs: Maximal assistance, +2 for physical assistance, +2 for safety/equipment(stand pivot) General ADL Comments: Pt presenting with decreased balance, awareness of right side, functional use of RUE, and cognition.    Cognition: Cognition Overall Cognitive Status: Impaired/Different from baseline Arousal/Alertness: Awake/alert Orientation Level: Oriented to person, Disoriented to situation, Disoriented to time, Disoriented to place Cognition Arousal/Alertness: Awake/alert Behavior During Therapy: Flat affect Overall Cognitive Status: Impaired/Different from baseline Area of Impairment: Following commands, Safety/judgement, Problem solving Current Attention Level: Focused Memory: Decreased short-term  memory Following Commands: Follows one step commands with increased time Safety/Judgement: Decreased awareness of safety, Decreased awareness of deficits Awareness: Intellectual Problem Solving: Slow processing, Difficulty sequencing, Requires verbal cues, Requires tactile cues, Decreased initiation General Comments: pt able to state name and recongnizes daughter (unable to state daughter's name) and answering with short phrases which daughter reports is an improvement  Difficult to assess due to: Impaired communication     Blood pressure 139/66, pulse (!) 57, temperature 98.6 F (37 C), temperature source Oral, resp. rate 20, height 5\' 11"  (1.803 m), weight 97.6 kg, SpO2 100 %. Physical Exam  Nursing note and vitals reviewed. Constitutional: He appears well-developed and well-nourished.  obese  HENT:  Healing abrasion right temple  Eyes: Pupils are equal, round, and reactive to light. EOM are normal.  Cardiovascular: Normal rate.  No murmur heard. Respiratory: Effort normal. No respiratory distress. He has no wheezes.  GI: He exhibits distension. There is no abdominal tenderness.  Musculoskeletal:        General: No deformity or edema.     Cervical back: Normal range of motion.  Neurological: He is alert.  Verbal output limited--very slow to respond with garbled speech and "yes" to most questions. He had difficulty following simple motor commands consistently with verbal and tactile cues. RUE with emerging tone and extensor tone RLE. Moves LUE and LLE more automatically. Assisted with transfer using left side. .   Skin: Skin is warm.  Psychiatric:  Flat, slow to engage      Lab Results Last 48 Hours        Results for orders placed or performed during the hospital encounter of 09/04/19 (from the past 48 hour(s))  Glucose, capillary     Status: Abnormal    Collection Time: 09/07/19  9:12 PM  Result Value Ref Range    Glucose-Capillary 103 (H) 70 - 99 mg/dL      Comment: Glucose  reference range applies only to samples taken after fasting for at least 8 hours.    Comment 1 Notify RN      Comment 2 Document in Chart    CBC     Status: Abnormal    Collection Time: 09/08/19  3:26 AM  Result Value Ref Range    WBC 7.9 4.0 - 10.5 K/uL    RBC 4.39 4.22 - 5.81 MIL/uL    Hemoglobin 13.5 13.0 - 17.0 g/dL    HCT 39.5 39.0 - 52.0 %    MCV 90.0 80.0 - 100.0 fL    MCH 30.8 26.0 - 34.0 pg    MCHC 34.2 30.0 - 36.0 g/dL  RDW 13.2 11.5 - 15.5 %    Platelets 77 (L) 150 - 400 K/uL      Comment: SPECIMEN CHECKED FOR CLOTS CONSISTENT WITH PREVIOUS RESULT Immature Platelet Fraction may be clinically indicated, consider ordering this additional test [id number] REPEATED TO VERIFY      nRBC 0.0 0.0 - 0.2 %      Comment: Performed at [facility], [place] [address] [place], [place] [id number]  Basic metabolic panel     Status: Abnormal    Collection Time: 09/08/19  3:26 AM  Result Value Ref Range    Sodium 137 135 - 145 mmol/L    Potassium 3.6 3.5 - 5.1 mmol/L    Chloride 109 98 - 111 mmol/L    CO2 23 22 - 32 mmol/L    Glucose, Bld 118 (H) 70 - 99 mg/dL      Comment: Glucose reference range applies only to samples taken after fasting for at least 8 hours.    BUN 21 8 - 23 mg/dL    Creatinine, Ser 1.37 (H) 0.61 - 1.24 mg/dL    Calcium 8.2 (L) 8.9 - 10.3 mg/dL    GFR calc non Af Amer 44 (L) >60 mL/min    GFR calc Af Amer 51 (L) >60 mL/min    Anion gap 5 5 - 15      Comment: Performed at [facility] [address], [place], [place] [address]  Glucose, capillary     Status: Abnormal    Collection Time: 09/08/19  6:03 AM  Result Value Ref Range    Glucose-Capillary 109 (H) 70 - 99 mg/dL      Comment: Glucose reference range applies only to samples taken after fasting for at least 8 hours.    Comment 1 Notify RN      Comment 2 Document in Chart    Glucose, capillary     Status: None    Collection Time: 09/08/19 12:08 PM  Result Value Ref Range     Glucose-Capillary 96 70 - 99 mg/dL      Comment: Glucose reference range applies only to samples taken after fasting for at least 8 hours.  Glucose, capillary     Status: Abnormal    Collection Time: 09/08/19  4:24 PM  Result Value Ref Range    Glucose-Capillary 100 (H) 70 - 99 mg/dL      Comment: Glucose reference range applies only to samples taken after fasting for at least 8 hours.  CBC     Status: Abnormal    Collection Time: 09/09/19  2:59 AM  Result Value Ref Range    WBC 7.0 4.0 - 10.5 K/uL    RBC 4.34 4.22 - 5.81 MIL/uL    Hemoglobin 13.4 13.0 - 17.0 g/dL    HCT 39.6 39.0 - 52.0 %    MCV 91.2 80.0 - 100.0 fL    MCH 30.9 26.0 - 34.0 pg    MCHC 33.8 30.0 - 36.0 g/dL    RDW 13.2 11.5 - 15.5 %    Platelets 73 (L) 150 - 400 K/uL      Comment: CONSISTENT WITH PREVIOUS RESULT Immature Platelet Fraction may be clinically indicated, consider ordering this additional test [id number] REPEATED TO VERIFY      nRBC 0.0 0.0 - 0.2 %      Comment: Performed at [facility], [place] [address], [place], [place] [id number]  Basic metabolic panel     Status: Abnormal  Collection Time: 09/09/19  2:59 AM  Result Value Ref Range    Sodium 137 135 - 145 mmol/L    Potassium 3.7 3.5 - 5.1 mmol/L    Chloride 110 98 - 111 mmol/L    CO2 21 (L) 22 - 32 mmol/L    Glucose, Bld 103 (H) 70 - 99 mg/dL      Comment: Glucose reference range applies only to samples taken after fasting for at least 8 hours.    BUN 17 8 - 23 mg/dL    Creatinine, Ser 1.22 0.61 - 1.24 mg/dL    Calcium 8.1 (L) 8.9 - 10.3 mg/dL    GFR calc non Af Amer 50 (L) >60 mL/min    GFR calc Af Amer 58 (L) >60 mL/min    Anion gap 6 5 - 15      Comment: Performed at [facility] [address], [place], [place] [address]  Glucose, capillary     Status: None    Collection Time: 09/09/19  6:29 AM  Result Value Ref Range    Glucose-Capillary 91 70 - 99 mg/dL      Comment: Glucose reference range applies only to samples taken  after fasting for at least 8 hours.    Comment 1 Notify RN      Comment 2 Document in Chart    Glucose, capillary     Status: Abnormal    Collection Time: 09/09/19 12:46 PM  Result Value Ref Range    Glucose-Capillary 127 (H) 70 - 99 mg/dL      Comment: Glucose reference range applies only to samples taken after fasting for at least 8 hours.    Comment 1 Notify RN      Comment 2 Document in Chart    Glucose, capillary     Status: Abnormal    Collection Time: 09/09/19  4:18 PM  Result Value Ref Range    Glucose-Capillary 102 (H) 70 - 99 mg/dL      Comment: Glucose reference range applies only to samples taken after fasting for at least 8 hours.    Comment 1 Notify RN      Comment 2 Document in Chart        Imaging Results (Last 48 hours)  No results found.           Medical Problem List and Plan: 1.  Functional, mobility and cognitive deficits secondary to left basal ganglia hemorrhage             -patient may  shower             -ELOS/Goals: 22-28 days/min assist with PT, OT, SLP 2.  Antithrombotics: -DVT/anticoagulation:  Mechanical: Sequential compression devices, below knee Bilateral lower extremities--platelets <100             -antiplatelet therapy: N/A 3. Chronic LBP/Pain Management:  Tylenol prn.  4. Mood: LCSW to follow for evaluation and support.              -antipsychotic agents: N/A 5. Neuropsych: This patient is not capable of making decisions on his own behalf. 6. Skin/Wound Care: Routine pressure relief measures.  7. Fluids/Electrolytes/Nutrition: Monitor I/O--check lytes in am.  8. HTN: Monitor tid--currently controlled off medications.  SBP goal< 140--will add prn catapres 9. H/o COPD: Stable without meds.  10. Impaired glucose tolerance: Monitor with ac/hs cbg checks and use SSI as indicated.  11. CAD/AVR: Off ASA due to bleed. No statin due to intolerance. Tenormin, cozaar and  lasix (used prn) on hold.  12. Thrombocytopenia: Continue to follow for  stability as trending down. Will monitor for signs of bleeding and for recovery.  13. AKI: Improved with gentle hydration. Re-check labs on admit             -IVF hs 14. Dysphagia: On dysphagia 1, honey liquids--will add IVF for hydration at nights.  -aspiration precautions 15. Constipation: Will add Senna at bedtime as no BM since admission.          Bary Leriche, PA-C 09/09/2019  I have personally performed a face to face diagnostic evaluation of this patient and formulated the key components of the plan.  Additionally, I have personally reviewed laboratory data, imaging studies, as well as relevant notes and concur with the physician assistant's documentation above.  The patient's status has not changed from the original H&P.  Any changes in documentation from the acute care chart have been noted above.  Meredith Staggers, MD, [facility]

## 2019-09-09 NOTE — PMR Pre-admission (Signed)
PMR Admission Coordinator Pre-Admission Assessment  Patient: David [name] is an 86 y.o., male MRN: [id number] DOB: [birth year] Height: 5\' 11"  (180.3 cm) Weight: 97.6 kg              Insurance Information HMO:  PPO:      PCP:      IPA:      80/20:      OTHER:  PRIMARY: Medicare a and b     Policy#: [id number]      Subscriber: pt Benefits:  Phone #: passport one online     Name: 4/26 Eff. Date: 07/15/1990     Deduct: $1484      Out of Pocket Max: none      Life Max: none  CIR: 100%      SNF: 20 full days Outpatient: 80%   Co-Pay: 20% Home Health: 100%      Co-Pay: none DME: 80%     Co-Pay: 20% Providers: pt choice  SECONDARY: [employer]      Policy#: [id number]      Phone#: pt  Financial Counselor:       Phone#:   The "Data Collection Information Summary" for patients in Inpatient Rehabilitation Facilities with attached "Privacy Act Cross Records" was provided and verbally reviewed with: Patient and Family  Emergency Contact Information Contact Information    Name Relation Home Work Mobile   David [name] Daughter [phone]  [phone]   David [name], David [name]  [phone]  [phone]     Current Medical History  Patient Admitting Diagnosis:acute left basal ganglia hemorrhage and remote bilateral basal ganglia lacunar infarcts  History of Present Illness: 86 y.o. male with history of CAD, COPD, LBP,  Tremors, AVR; who was admitted on 09/04/2019 with right facial weakness, dysarthria, right hemiparesis, left gaze preference.  History taken from daughter and son due to patient's aphasia and lethargy.  Plans are for patient to return home with daughter who can only provide limited physical assistance at discharge.  Daughter had left for MD appointment and when she returned about a hour later, he was found lying on the floor between the beds. EMS was activated and CT head done showing acute left ICH.  Per report, acute ICH left lentiform nucleus and chronic small vessel disease.   CTA head/neck done revealing no evidence of vascular malformation and left basal ganglia hemorrhage negative for spot sign.  Follow-up CT head personally reviewed showing stable moderate sized hemorrhage.  MRI/MRA brain done 4/22 showing stable acute left basal ganglia hemorrhage and remote bilateral basal ganglia lacunar infarcts with advanced white matter disease.  Bleed felt to be due to small vessel disease versus thrombocytopenia.  Platelets were 93 at admission  Echocardiogram showing ejection fraction of 60-65% with mild left ventricular hypertrophy and bioprosthetic aortic valve as well as 1.1 cm vegetation attached to RV site of pulmonic valve.  TEE ordered for further evaluation.  Hospital was further complicated by aphasia.  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 Diet upgraded to Dysphagia 1 with honey thick liquids with meds crushed with puree.  Patient with limitations in balance, BLE weakness with strong right lean as well as visual impairments affecting ADLs and mobility.    Complete NIHSS TOTAL: 19 Glasgow Coma Scale Score: 15  Past Medical History  Past Medical History:  Diagnosis Date  . CAD (coronary artery disease)    intolerance of statins  . COPD (chronic obstructive pulmonary disease) ([place])   .  Current use of long term anticoagulation   . GERD (gastroesophageal reflux disease)   . Hyperlipidemia   . Hypertension   . Impaired glucose tolerance 02/14/2011  . LBP (low back pain)   . OA (osteoarthritis)   . 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    Family History  family history includes Coronary artery disease in an other family member; Hypertension in an other family member.  Prior Rehab/Hospitalizations:  Has the patient had prior rehab or hospitalizations prior to admission? Yes  Has the patient had major surgery during 100 days prior to  admission? No  Current Medications   Current Facility-Administered Medications:  .  acetaminophen (TYLENOL) tablet 650 mg, 650 mg, Oral, Q4H PRN, 650 mg at 09/08/19 0741 **OR** [DISCONTINUED] acetaminophen (TYLENOL) 160 MG/5ML solution 650 mg, 650 mg, Per Tube, Q4H PRN **OR** [DISCONTINUED] acetaminophen (TYLENOL) suppository 650 mg, 650 mg, Rectal, Q4H PRN, Marliss Coots, PA-C .  bisacodyl (DULCOLAX) suppository 10 mg, 10 mg, Rectal, Daily PRN, Cherene Altes, MD .  chlorhexidine (PERIDEX) 0.12 % solution 15 mL, 15 mL, Mouth Rinse, BID, Rosalin Hawking, MD, 15 mL at 09/09/19 0932 .  Chlorhexidine Gluconate Cloth 2 % PADS 6 each, 6 each, Topical, Daily, Rosalin Hawking, MD, 6 each at 09/06/19 [phone] .  hydrALAZINE (APRESOLINE) injection 5-10 mg, 5-10 mg, Intravenous, Q4H PRN, Rosalin Hawking, MD .  labetalol (NORMODYNE) injection 10-20 mg, 10-20 mg, Intravenous, Q2H PRN, Rosalin Hawking, MD .  lactulose (CHRONULAC) 10 GM/15ML solution 20 g, 20 g, Oral, BID PRN, Cherene Altes, MD, 20 g at 09/09/19 1043 .  MEDLINE mouth rinse, 15 mL, Mouth Rinse, q12n4p, Rosalin Hawking, MD, 15 mL at 09/09/19 1301 .  pantoprazole (PROTONIX) EC tablet 40 mg, 40 mg, Oral, QHS, Karren Cobble, RPH, 40 mg at 09/08/19 2151 .  senna-docusate (Senokot-S) tablet 1 tablet, 1 tablet, Oral, BID, Marliss Coots, PA-C, 1 tablet at 09/09/19 0932 .  sodium chloride flush (NS) 0.9 % injection 3 mL, 3 mL, Intravenous, Once, Tegeler, Gwenyth Allegra, MD  Patients Current Diet:  Diet Order            DIET - DYS 1 Room service appropriate? No; Fluid consistency: Honey Thick  Diet effective now              Precautions / Restrictions Precautions Precautions: Fall Precaution Comments: pushing to R Restrictions Weight Bearing Restrictions: No   Has the patient had 2 or more falls or a fall with injury in the past year?No  Prior Activity Level Community (5-7x/wk): driving, independent  Prior Functional Level Prior  Function Level of Independence: Independent Comments: pt driving, no use of assistive devices, independent with all ADLs  Self Care: Did the patient need help bathing, dressing, using the toilet or eating?  Independent  Indoor Mobility: Did the patient need assistance with walking from room to room (with or without device)? Independent  Stairs: Did the patient need assistance with internal or external stairs (with or without device)? Independent  Functional Cognition: Did the patient need help planning regular tasks such as shopping or remembering to take medications? Independent  Home Assistive Devices / Equipment Home Assistive Devices/Equipment: Eyeglasses Home Equipment: Environmental consultant - 2 wheels, Cane - quad, Wheelchair - power, Civil engineer, contracting  Prior Device Use: Indicate devices/aids used by the patient prior to current illness, exacerbation or injury? None of the above  Current Functional Level Cognition  Arousal/Alertness: Awake/alert Overall Cognitive Status: Impaired/Different from baseline Difficult  to assess due to: Impaired communication Current Attention Level: Focused Orientation Level: Oriented to person Following Commands: Follows one step commands with increased time Safety/Judgement: Decreased awareness of safety, Decreased awareness of deficits General Comments: pt able to state name and recongnizes daughter (unable to state daughter's name) and answering with short phrases which daughter reports is an improvement     Extremity Assessment (includes Sensation/Coordination)  Upper Extremity Assessment: RUE deficits/detail RUE Deficits / Details: Increased tone. Able to perform PROM.  RUE Coordination: decreased fine motor, decreased gross motor  Lower Extremity Assessment: Defer to PT evaluation RLE Deficits / Details: no AROM noted, pt with significant extensor tone at knee and hip LLE Deficits / Details: LLE WFL    ADLs  Overall ADL's : Needs  assistance/impaired Eating/Feeding: NPO, Sitting Grooming: Moderate assistance, Sitting Upper Body Bathing: Moderate assistance, Sitting Lower Body Bathing: Maximal assistance, +2 for physical assistance, +2 for safety/equipment, Sit to/from stand Upper Body Dressing : Moderate assistance, Sitting Lower Body Dressing: Maximal assistance, +2 for physical assistance, +2 for safety/equipment, Sit to/from stand Toilet Transfer: Maximal assistance, +2 for physical assistance, +2 for safety/equipment, Stand-pivot(simulated to recliner) Functional mobility during ADLs: Maximal assistance, +2 for physical assistance, +2 for safety/equipment(stand pivot) General ADL Comments: Pt presenting with decreased balance, awareness of right side, functional use of RUE, and cognition.     Mobility  Overal bed mobility: Needs Assistance Bed Mobility: Supine to Sit Rolling: Mod assist, +2 for physical assistance Sidelying to sit: +2 for physical assistance, Max assist Supine to sit: +2 for physical assistance, Max assist Sit to sidelying: Mod assist, +2 for safety/equipment General bed mobility comments: pt able to bring L LE to EOB with multimocal cues; assist to bring R LE and hips to EOB and then to elevate trunk into sitting; pt using L UE to assist    Transfers  Overall transfer level: Needs assistance Equipment used: None Transfers: Sit to/from Stand, Squat Pivot Transfers Sit to Stand: +2 physical assistance, Mod assist, From elevated surface Squat pivot transfers: Max assist, +2 physical assistance General transfer comment: pt able to stand upright briefly and then with flexed posture; able to take 2 very short steps toward chair with L LE; +2 required for balance, mobilizing R LE, and to guard hips     Ambulation / Gait / Stairs / Wheelchair Mobility       Posture / Balance Dynamic Sitting Balance Sitting balance - Comments: pt able to maintain sitting EOB with min guard for safety; pt using L UE  to hold onto bed rail  Balance Overall balance assessment: Needs assistance Sitting-balance support: Feet supported, Single extremity supported Sitting balance-Leahy Scale: Poor Sitting balance - Comments: pt able to maintain sitting EOB with min guard for safety; pt using L UE to hold onto bed rail  Postural control: Right lateral lean, Posterior lean Standing balance support: Single extremity supported Standing balance-Leahy Scale: Zero Standing balance comment: unable to get all the way upright today    Special needs/care consideration Diabetic management with Hgb A1c 5.9 Bladder management with external catheter Bowel incontinence Designated visitors are Narda Rutherford and [facility]   Previous Home Environment  Living Arrangements: Children  Lives With: Daughter Available Help at Discharge: Family, Available 24 hours/day Type of Home: House Home Layout: One level Home Access: Ramped entrance Bathroom Shower/Tub: Multimedia programmer: Handicapped height Bathroom Accessibility: Yes How Accessible: Accessible via walker Home Care Services: No Additional Comments: daughter providing history as pt with impaired speech  Discharge Living Setting Plans for Discharge Living Setting: Patient's home, Lives with (comment)(daughter) Type of Home at Discharge: House Discharge Home Layout: One level Discharge Home Access: [place] entrance Discharge Bathroom Shower/Tub: Walk-in shower Discharge Bathroom Toilet: Handicapped height Discharge Bathroom Accessibility: Yes How Accessible: Accessible via walker Does the patient have any problems obtaining your medications?: No  Social/Family/Support Systems Patient Roles: Parent Contact Information: daughter, Narda Rutherford Anticipated Caregiver: family Anticipated Caregiver's Contact Information: see above Ability/Limitations of Caregiver: min assist Caregiver Availability: 24/7 Discharge Plan Discussed with Primary Caregiver: Yes Is Caregiver  In Agreement with Plan?: Yes Does Caregiver/Family have Issues with Lodging/Transportation while Pt is in Rehab?: No  Goals Patient/Family Goal for Rehab: Min PT, OT, and SLP Expected length of stay: ELOS 22 to 25 days Pt/Family Agrees to Admission and willing to participate: Yes Program Orientation Provided & Reviewed with Pt/Caregiver Including Roles  & Responsibilities: Yes  Decrease burden of Care through IP rehab admission: n/a  Possible need for SNF placement upon discharge:Family and patient wish to avoid SNF if possible  Patient Condition: This patient's medical and functional status has changed since the consult dated: 09/06/2019 in which the Rehabilitation Physician determined and documented that the patient's condition is appropriate for intensive rehabilitative care in an inpatient rehabilitation facility. See "History of Present Illness" (above) for medical update. Functional changes are: max assist. Patient's medical and functional status update has been discussed with the Rehabilitation physician and patient remains appropriate for inpatient rehabilitation. Will admit to inpatient rehab today.  Preadmission Screen Completed By:  Cleatrice Burke, RN, 09/09/2019 3:41 PM ______________________________________________________________________   Discussed status with Dr. Naaman Plummer on 09/09/2019 at  1542 and received approval for admission today.  Admission Coordinator:  Cleatrice Burke, time [id number] Date 09/09/2019

## 2019-09-09 NOTE — Progress Notes (Signed)
Physical Therapy Treatment Patient Details Name: David [name] MRN: [id number] DOB: 1924/03/15 Today's Date: 09/09/2019    History of Present Illness 86 y.o. male with past medical history of status post AVR, impaired glucose tolerance, hypertension, hyperlipidemia, COPD and CAD. Pt found with R weakness, R facial droop and left gaze preference. Pt found to have subcortical nontraumatic ICH (L basal ganglia) on CT scan.    PT Comments    Patient is making progress toward PT goals and tolerated mobility well. Daughter present and supportive. Pt continues to be a good candidate for CIR.    Follow Up Recommendations  CIR;Supervision/Assistance - 24 hour     Equipment Recommendations  Other (comment)(defer to post acute )    Recommendations for Other Services       Precautions / Restrictions Precautions Precautions: Fall Restrictions Weight Bearing Restrictions: No    Mobility  Bed Mobility Overal bed mobility: Needs Assistance Bed Mobility: Supine to Sit     Supine to sit: +2 for physical assistance;Max assist     General bed mobility comments: pt able to bring L LE to EOB with multimocal cues; assist to bring R LE and hips to EOB and then to elevate trunk into sitting; pt using L UE to assist  Transfers Overall transfer level: Needs assistance Equipment used: None Transfers: Sit to/from [employer] Sit to Stand: +2 physical assistance;Mod assist;From elevated surface   Squat pivot transfers: Max assist;+2 physical assistance     General transfer comment: pt able to stand upright briefly and then with flexed posture; able to take 2 very short steps toward chair with L LE; +2 required for balance, mobilizing R LE, and to guard hips   Ambulation/Gait                 Stairs             Wheelchair Mobility    Modified Rankin (Stroke Patients Only) Modified Rankin (Stroke Patients Only) Pre-Morbid Rankin Score: No symptoms Modified  Rankin: Severe disability     Balance Overall balance assessment: Needs assistance Sitting-balance support: Feet supported;Single extremity supported Sitting balance-Leahy Scale: Poor Sitting balance - Comments: pt able to maintain sitting EOB with min guard for safety; pt using L UE to hold onto bed rail    Standing balance support: Single extremity supported Standing balance-Leahy Scale: Zero                              Cognition Arousal/Alertness: Awake/alert Behavior During Therapy: Flat affect Overall Cognitive Status: Impaired/Different from baseline Area of Impairment: Following commands;Safety/judgement;Problem solving                       Following Commands: Follows one step commands with increased time     Problem Solving: Slow processing;Difficulty sequencing;Requires verbal cues;Requires tactile cues;Decreased initiation General Comments: pt able to state name and recongnizes daughter (unable to state daughter's name) and answering with short phrases which daughter reports is an improvement       Exercises      General Comments General comments (skin integrity, edema, etc.): R UE swelling and ID bracelet cut off and RN notified       Pertinent Vitals/Pain Pain Assessment: Faces Faces Pain Scale: No hurt Pain Location: daughter reports pt has LBP at baseline Pain Intervention(s): Monitored during session    Home Living  Prior Function            PT Goals (current goals can now be found in the care plan section) Progress towards PT goals: Progressing toward goals    Frequency    Min 4X/week      PT Plan Current plan remains appropriate    Co-evaluation              AM-PAC PT "6 Clicks" Mobility   Outcome Measure  Help needed turning from your back to your side while in a flat bed without using bedrails?: A Lot Help needed moving from lying on your back to sitting on the side of a flat  bed without using bedrails?: A Lot Help needed moving to and from a bed to a chair (including a wheelchair)?: A Lot Help needed standing up from a chair using your arms (e.g., wheelchair or bedside chair)?: A Lot Help needed to walk in hospital room?: Total Help needed climbing 3-5 steps with a railing? : Total 6 Click Score: 10    End of Session Equipment Utilized During Treatment: Gait belt Activity Tolerance: Patient tolerated treatment well Patient left: with call bell/phone within reach;with family/visitor present;in chair Nurse Communication: Need for lift equipment;Mobility status;Other (comment)([place] standing frame) PT Visit Diagnosis: Other abnormalities of gait and mobility (R26.89);Muscle weakness (generalized) (M62.81);Other symptoms and signs involving the nervous system (R29.898);Hemiplegia and hemiparesis Hemiplegia - Right/Left: Right Hemiplegia - dominant/non-dominant: Dominant Hemiplegia - caused by: Nontraumatic intracerebral hemorrhage     Time: 0900-0927 PT Time Calculation (min) (ACUTE ONLY): 27 min  Charges:  $Gait Training: 8-22 mins $Therapeutic Activity: 8-22 mins                     Earney Navy, PTA Acute Rehabilitation Services Pager: [phone] Office: [phone]     Darliss Cheney 09/09/2019, 9:43 AM

## 2019-09-10 ENCOUNTER — Encounter (HOSPITAL_COMMUNITY): Payer: Medicare Other

## 2019-09-10 ENCOUNTER — Other Ambulatory Visit: Payer: Self-pay

## 2019-09-10 ENCOUNTER — Inpatient Hospital Stay (HOSPITAL_COMMUNITY): Payer: Medicare Other | Admitting: Speech Pathology

## 2019-09-10 ENCOUNTER — Inpatient Hospital Stay (HOSPITAL_COMMUNITY): Payer: Medicare Other

## 2019-09-10 ENCOUNTER — Inpatient Hospital Stay (HOSPITAL_COMMUNITY): Payer: Medicare Other | Admitting: Occupational Therapy

## 2019-09-10 DIAGNOSIS — D696 Thrombocytopenia, unspecified: Secondary | ICD-10-CM

## 2019-09-10 DIAGNOSIS — R1312 Dysphagia, oropharyngeal phase: Secondary | ICD-10-CM

## 2019-09-10 DIAGNOSIS — K5901 Slow transit constipation: Secondary | ICD-10-CM

## 2019-09-10 LAB — COMPREHENSIVE METABOLIC PANEL
ALT: 20 U/L (ref 0–44)
AST: 19 U/L (ref 15–41)
Albumin: 2.7 g/dL — ABNORMAL LOW (ref 3.5–5.0)
Alkaline Phosphatase: 37 U/L — ABNORMAL LOW (ref 38–126)
Anion gap: 6 (ref 5–15)
BUN: 13 mg/dL (ref 8–23)
CO2: 23 mmol/L (ref 22–32)
Calcium: 8.2 mg/dL — ABNORMAL LOW (ref 8.9–10.3)
Chloride: 107 mmol/L (ref 98–111)
Creatinine, Ser: 1.35 mg/dL — ABNORMAL HIGH (ref 0.61–1.24)
GFR calc Af Amer: 52 mL/min — ABNORMAL LOW (ref >60)
GFR calc non Af Amer: 45 mL/min — ABNORMAL LOW (ref >60)
Glucose, Bld: 107 mg/dL — ABNORMAL HIGH (ref 70–99)
Potassium: 3.6 mmol/L (ref 3.5–5.1)
Sodium: 136 mmol/L (ref 135–145)
Total Bilirubin: 1.4 mg/dL — ABNORMAL HIGH (ref 0.3–1.2)
Total Protein: 5.7 g/dL — ABNORMAL LOW (ref 6.5–8.1)

## 2019-09-10 LAB — CBC WITH DIFFERENTIAL/PLATELET
Abs Immature Granulocytes: 0.01 10*3/uL (ref 0.00–0.07)
Basophils Absolute: 0 10*3/uL (ref 0.0–0.1)
Basophils Relative: 0 %
Eosinophils Absolute: 0.2 10*3/uL (ref 0.0–0.5)
Eosinophils Relative: 3 %
HCT: 41.2 % (ref 39.0–52.0)
Hemoglobin: 14 g/dL (ref 13.0–17.0)
Immature Granulocytes: 0 %
Lymphocytes Relative: 24 %
Lymphs Abs: 1.7 10*3/uL (ref 0.7–4.0)
MCH: 30.6 pg (ref 26.0–34.0)
MCHC: 34 g/dL (ref 30.0–36.0)
MCV: 90.2 fL (ref 80.0–100.0)
Monocytes Absolute: 0.7 10*3/uL (ref 0.1–1.0)
Monocytes Relative: 10 %
Neutro Abs: 4.3 10*3/uL (ref 1.7–7.7)
Neutrophils Relative %: 63 %
Platelets: 76 10*3/uL — ABNORMAL LOW (ref 150–400)
RBC: 4.57 MIL/uL (ref 4.22–5.81)
RDW: 13.2 % (ref 11.5–15.5)
WBC: 6.9 10*3/uL (ref 4.0–10.5)
nRBC: 0 % (ref 0.0–0.2)

## 2019-09-10 LAB — GLUCOSE, CAPILLARY
Glucose-Capillary: 92 mg/dL (ref 70–99)
Glucose-Capillary: 92 mg/dL (ref 70–99)
Glucose-Capillary: 92 mg/dL (ref 70–99)
Glucose-Capillary: 110 mg/dL — ABNORMAL HIGH (ref 70–99)

## 2019-09-10 MED ORDER — PIPERACILLIN-TAZOBACTAM 3.375 G IVPB 30 MIN
3.3750 g | Freq: Once | INTRAVENOUS | Status: AC
  Administered 2019-09-10: 20:00:00 3.375 g via INTRAVENOUS
  Filled 2019-09-10: qty 50

## 2019-09-10 MED ORDER — EZETIMIBE 10 MG PO TABS
10.0000 mg | ORAL_TABLET | Freq: Every day | ORAL | Status: DC
  Administered 2019-09-11 – 2019-10-07 (×28): 10 mg via ORAL
  Filled 2019-09-10 (×30): qty 1

## 2019-09-10 MED ORDER — PIPERACILLIN-TAZOBACTAM 3.375 G IVPB
3.3750 g | Freq: Three times a day (TID) | INTRAVENOUS | Status: DC
  Administered 2019-09-11 – 2019-09-13 (×7): 3.375 g via INTRAVENOUS
  Filled 2019-09-10 (×10): qty 50

## 2019-09-10 MED ORDER — PANTOPRAZOLE SODIUM 40 MG PO PACK
40.0000 mg | PACK | Freq: Every day | ORAL | Status: DC
  Administered 2019-09-12 – 2019-10-06 (×25): 40 mg via ORAL
  Filled 2019-09-10 (×25): qty 20

## 2019-09-10 NOTE — Progress Notes (Signed)
Social Work Assessment and Plan   Patient Details  Name: David [name] MRN: [id number] Date of Birth: 09-10-1923  Today's Date: 09/10/2019  Problem List:  Patient Active Problem List   Diagnosis Date Noted  . Coronary artery disease involving native coronary artery of native heart without angina pectoris   . Chronic obstructive pulmonary disease ([place])   . Tremors of nervous system   . S/P AVR   . Thrombocytopenia ([place])   . Global aphasia   . ICH (intracerebral hemorrhage) ([place]) 09/04/2019  . Tremor 05/20/2019  . 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 Chest pain, atypical 09/20/2018  . Onychomycosis 06/06/2018  . Acute pain of left knee 08/18/2017  . Wart 06/05/2017  . Acute upper respiratory infection 05/12/2016  . Skin irritation from shaving 12/02/2015  . Benign paroxysmal positional vertigo 03/09/2015  . Plantar fasciitis, bilateral 12/02/2014  . Constipation 07/17/2014  . Cough 04/09/2014  . S/P AVR (aortic valve replacement) 11/04/2013  . Elevated hemoglobin ([place]) 12/14/2012  . Well adult exam 11/12/2012  . 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 Impaired glucose tolerance 02/14/2011  . Edema 10/22/2010  . Actinic keratoses 10/22/2010  . THROMBOCYTOPENIA 01/22/2010  . MELANOMA 03/05/2009  . TOBACCO USE, QUIT 02/27/2009  . Neoplasm of uncertain behavior of skin 05/08/2007  . Dyslipidemia 02/08/2007  . Essential hypertension 02/08/2007  . Coronary atherosclerosis 02/08/2007  . COPD 02/08/2007  . GERD 02/08/2007  . OSTEOARTHRITIS 02/08/2007  . LOW BACK PAIN 02/08/2007   Past Medical History:  Past Medical History:  Diagnosis Date  . CAD (coronary artery disease)    intolerance of statins  . COPD (chronic obstructive pulmonary disease) ([place])   . Current use of long term anticoagulation   . GERD (gastroesophageal reflux disease)   . Hyperlipidemia   . Hypertension   . Impaired glucose tolerance 02/14/2011  . LBP (low back pain)   . OA (osteoarthritis)   .  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   Past Surgical History:  Past Surgical History:  Procedure Laterality Date  . 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 CARDIAC CATHETERIZATION  10/05/1998   Recommend CABG revascularizaition  . CARDIAC CATHETERIZATION  06/11/1999   Continue medical therapy  . CARDIAC CATHETERIZATION  01/08/2007   Patent grafts, recommend medical therapy  . CARDIOVASCULAR STRESS TEST  04/22/2010   Small lateral defect which is partially reversible, no ischemic EKG changes were noted.  Marland Kitchen CAROTID DOPPLER  04/22/2010   Bilateral ICAs-no evidence of diametere reduction, significant tortuosity, or any other vascular abnormality.  . CORONARY ARTERY BYPASS GRAFT  10/09/1998   LIMA to LAD, SVG to diag, SVG sequential to 2 marginals, SVG sequential to PDA & PLA. CE#25 porcine AVR.  Marland Kitchen TRANSTHORACIC ECHOCARDIOGRAM  05/14/2012   EF 50-55%, bioprosthesis of the aortic valve with well preserved LV function, moderate concentric [place]   Social History:  reports that he quit smoking about 21 years ago. His smoking use included pipe. He has quit using smokeless tobacco. He reports that he does not drink alcohol or use drugs.  Family / Support Systems Marital Status: Single Spouse/Significant Other: Deceased Children: 3 Children Other Supports: Other son and daughter Anticipated Caregiver: Daughter Hotel manager Availability: 24/7  Social History Preferred language: English Religion: Protestant Cultural Background: Retired Designer, jewellery Read: Yes Write: Yes Employment Status: Retired Date Retired/Disabled/Unemployed: 69 years   Abuse/Neglect Abuse/Neglect Assessment Can Be Completed: Unable to assess, patient is non-responsive or altered mental  status Physical Abuse: Denies Verbal Abuse: Denies Sexual Abuse: Denies Exploitation of patient/patient's resources: Denies Self-Neglect: Denies  Emotional Status Pt's affect, behavior and  adjustment status: Daughter reports no changes in mood. Recent Psychosocial Issues: N/a Psychiatric History: N/a Substance Abuse History: N/a  Patient / Family Perceptions, Expectations & Goals Pt/Family understanding of illness & functional limitations: yes Pt/family expectations/goals: Goal for patient to return back home with daughter to care  [employer]: None Premorbid Home Care/DME Agencies: None Transportation available at discharge: Daughter able to transport  Discharge Planning Living Arrangements: Children(Daughter lives with patient) Support Systems: Children Type of Residence: Private residence Insurance Resources: Chartered certified accountant Resources: [place] Referred: Yes Living Expenses: Rent Money Management: Other (Comment)(Jania has healthcare POA, other daughter has Fish farm manager) Does the patient have any problems obtaining your medications?: No Sw Barriers to Discharge: Lack of/limited family support, Medical stability Social Work Anticipated Follow Up Needs: [place] Additional Notes/Comments: Has: walk in shower, BSC, Rolling Walker, has ramp, moterized WC Expected length of stay: 22-28 Days  Clinical Impression SW entered room to meet patient, sister at bedside. Patient unable to stay awake, very lethargic and confused. Daughter reports he was been that way since choking incident. Previous day she reports patient was doing everything independently. Sw introduced self, explained role and addressed any questions or concerns. Daughter pleasant and very active in care. SW will follow up with daughter.    Dyanne Iha 09/10/2019, 9:44 AM

## 2019-09-10 NOTE — Progress Notes (Signed)
Inpatient Rehabilitation  Patient information reviewed and entered into eRehab system by Mahkai Fangman M. Jalie Eiland, M.A., CCC/SLP, PPS Coordinator.  Information including medical coding, functional ability and quality indicators will be reviewed and updated through discharge.    

## 2019-09-10 NOTE — Progress Notes (Signed)
Discussed results with Dr. Ladora Daniel felt that CT with slight increase in bleed--not significant and no intervention needed.  Edema and potential of aspiration likely cause of lethargy and nothing further to add at this time.

## 2019-09-10 NOTE — Evaluation (Signed)
Occupational Therapy Assessment and Plan  Patient Details  Name: David [name] MRN: [id number] Date of Birth: 1924/02/01  OT Diagnosis: abnormal posture, cognitive deficits, hemiplegia affecting dominant side, lumbago (low back pain), muscle weakness (generalized) and coordination disorder Rehab Potential: Rehab Potential (ACUTE ONLY): Fair ELOS: 3.5- 4 weeks   Today's Date: 09/10/2019 OT Individual Time: 1100-1200 OT Individual Time Calculation (min): 60 min     Problem List:  Patient Active Problem List   Diagnosis Date Noted  . Coronary artery disease involving native coronary artery of native heart without angina pectoris   . Chronic obstructive pulmonary disease ([place])   . Tremors of nervous system   . S/P AVR   . Thrombocytopenia ([place])   . Global aphasia   . ICH (intracerebral hemorrhage) ([place]) 09/04/2019  . Tremor 05/20/2019  . 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 Chest pain, atypical 09/20/2018  . Onychomycosis 06/06/2018  . Acute pain of left knee 08/18/2017  . Wart 06/05/2017  . Acute upper respiratory infection 05/12/2016  . Skin irritation from shaving 12/02/2015  . Benign paroxysmal positional vertigo 03/09/2015  . Plantar fasciitis, bilateral 12/02/2014  . Constipation 07/17/2014  . Cough 04/09/2014  . S/P AVR (aortic valve replacement) 11/04/2013  . Elevated hemoglobin ([place]) 12/14/2012  . Well adult exam 11/12/2012  . 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 Impaired glucose tolerance 02/14/2011  . Edema 10/22/2010  . Actinic keratoses 10/22/2010  . THROMBOCYTOPENIA 01/22/2010  . MELANOMA 03/05/2009  . TOBACCO USE, QUIT 02/27/2009  . Neoplasm of uncertain behavior of skin 05/08/2007  . Dyslipidemia 02/08/2007  . Essential hypertension 02/08/2007  . Coronary atherosclerosis 02/08/2007  . COPD 02/08/2007  . GERD 02/08/2007  . OSTEOARTHRITIS 02/08/2007  . LOW BACK PAIN 02/08/2007    Past Medical History:  Past Medical History:  Diagnosis Date  .  CAD (coronary artery disease)    intolerance of statins  . COPD (chronic obstructive pulmonary disease) ([place])   . Current use of long term anticoagulation   . GERD (gastroesophageal reflux disease)   . Hyperlipidemia   . Hypertension   . Impaired glucose tolerance 02/14/2011  . LBP (low back pain)   . OA (osteoarthritis)   . 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   Past Surgical History:  Past Surgical History:  Procedure Laterality Date  . 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 CARDIAC CATHETERIZATION  10/05/1998   Recommend CABG revascularizaition  . CARDIAC CATHETERIZATION  06/11/1999   Continue medical therapy  . CARDIAC CATHETERIZATION  01/08/2007   Patent grafts, recommend medical therapy  . CARDIOVASCULAR STRESS TEST  04/22/2010   Small lateral defect which is partially reversible, no ischemic EKG changes were noted.  Marland Kitchen CAROTID DOPPLER  04/22/2010   Bilateral ICAs-no evidence of diametere reduction, significant tortuosity, or any other vascular abnormality.  . CORONARY ARTERY BYPASS GRAFT  10/09/1998   LIMA to LAD, SVG to diag, SVG sequential to 2 marginals, SVG sequential to PDA & PLA. CE#25 porcine AVR.  Marland Kitchen TRANSTHORACIC ECHOCARDIOGRAM  05/14/2012   EF 50-55%, bioprosthesis of the aortic valve with well preserved LV function, moderate concentric [facility]    Assessment & Plan Clinical Impression: Patient is a 86 y.o. year old male with history of CAD, COPD, LBP, tremors, AVR who was admitted on 04/21/21with rightfacial weakness, dysarthria, right hemiparesis, and left gaze preference. Daughter had left for MD appointment and when she returned an hour later she was lying on the floor between  beds. EMS was activated and CT head done showing acute left ICH. CTA head/neck was negative for vascular malformation and left basal ganglia was negative for spot sign. MRI/MRA of brain done showing stable acute left basal ganglia hemorrhage with remote bilateral basal  ganglia lacunar infarcts and advanced white matter disease. Bleed was felt to be due to small vessel disease versus thrombocytopenia as platelets with 93 at admission.  2D echo was done showing EF of 60 to 65% with mild LVH and bioprosthetic aortic valve as well as 1.1 cm with vegetation attached to the RV side of pulmonic valve. 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 Claiborne Billings. Is patient without fevers or other clinical indication of SBE TEE was not felt to be indicated. MBS was done on 423 showing moderate dysphagia with delayed swallow and trace aspiration of thins--he was placed on dysphagia 1, honey liquids by spoon. Patient with resultant dysphagia, aphasia, delayed processing with difficulty following one-step commands BLE weakness with visual impairments affecting ADLs and mobility.  Patient transferred to CIR on 09/09/2019 .    Patient currently requires total with basic self-care skills secondary to muscle weakness, decreased cardiorespiratoy endurance, abnormal tone, decreased coordination and decreased motor planning, L gaze preference, decreased midline orientation and decreased attention to right, decreased initiation, decreased attention, decreased awareness, decreased problem solving, decreased safety awareness, decreased memory and delayed processing and decreased sitting balance, decreased standing balance, decreased postural control, hemiplegia and decreased balance strategies.  Prior to hospitalization, patient could complete ADLs and IADLs with independent .  Patient will benefit from skilled intervention to decrease level of assist with basic self-care skills prior to discharge home with care partner.  Anticipate patient will require 24 hour supervision, minimal physical assistance and moderate physical assestance and follow up home  health.  OT - End of Session Activity Tolerance: Decreased this session Endurance Deficit: Yes OT Assessment Rehab Potential (ACUTE ONLY): Fair OT Barriers to Discharge: Medical stability OT Barriers to Discharge Comments: lethargy OT Patient demonstrates impairments in the following area(s): Balance;Cognition;Endurance;Motor;Safety;Vision OT Basic ADL's Functional Problem(s): Eating;Grooming;Bathing;Dressing;Toileting OT Transfers Functional Problem(s): Toilet;Tub/Shower OT Additional Impairment(s): Fuctional Use of Upper Extremity OT Plan OT Intensity: Minimum of 1-2 x/day, 45 to 90 minutes OT Frequency: Total of 15 hours over 7 days of combined therapies OT Duration/Estimated Length of Stay: 3.5- 4 weeks OT Treatment/Interventions: Balance/vestibular training;DME/adaptive equipment instruction;Patient/family education;Therapeutic Activities;Wheelchair propulsion/positioning;Cognitive remediation/compensation;Functional electrical stimulation;Psychosocial support;Therapeutic Exercise;Community reintegration;Functional mobility training;Self Care/advanced ADL retraining;UE/LE Strength taining/ROM;Discharge planning;Neuromuscular re-education;UE/LE Coordination activities;Pain management OT Self Feeding Anticipated Outcome(s): S OT Basic Self-Care Anticipated Outcome(s): S - mod A OT Toileting Anticipated Outcome(s): mod A OT Bathroom Transfers Anticipated Outcome(s): min A OT Recommendation Recommendations for Other Services: Neuropsych consult Patient destination: Home Follow Up Recommendations: Home health OT;24 hour supervision/assistance Equipment Recommended: To be determined   Skilled Therapeutic Intervention Upon entering the room, pt sleeping soundly in bed with daughter present in the room. Pt very lethargic throughout session with minimal eye opening throughout. OT educating caregiver on OT purpose, POC, and goals with her verbalizing understanding and asking appropriate  questions. Caregiver is concerned over pt's new increased lethargy and slurred speech with NT and PA notified this session. All bed mobility and self care tasks with total A - +2 and hand over hand. Pt not transferred secondary to safety concern. Pt remained in bed at end of session with bed alarm activated and call bell within  reach.   OT Evaluation Precautions/Restrictions  Precautions Precautions: Fall Precaution Comments: R hemi; R inattention Vital Signs Therapy Vitals Temp: 100 F (37.8 C) Pulse Rate: 74 Resp: 20 BP: (!) 141/69 Patient Position (if appropriate): Lying Oxygen Therapy SpO2: 96 % O2 Device: Room Air Pain Pain Assessment Pain Scale: Faces Faces Pain Scale: No hurt Home Living/Prior Functioning Home Living Family/patient expects to be discharged to:: Unsure Living Arrangements: Children(Daughter lives with patient) Available Help at Discharge: Family, Available 24 hours/day Type of Home: House Home Access: Ramped entrance Home Layout: One level Bathroom Shower/Tub: Multimedia programmer: Handicapped height Bathroom Accessibility: Yes Additional Comments: daughter able to provide up to min assist  Lives With: Daughter Prior Function Level of Independence: Independent with basic ADLs, Independent with homemaking with ambulation, Independent with gait, Independent with transfers Driving: Yes Vocation: Retired Comments: pt driving, no use of assistive devices, independent with all ADLs Vision Baseline Vision/History: Wears glasses Wears Glasses: At all times Vision Assessment?: Vision impaired- to be further tested in functional context Perception  Perception: Impaired Praxis Praxis: Impaired Praxis Impairment Details: Initiation;Motor planning Cognition Overall Cognitive Status: Impaired/Different from baseline Arousal/Alertness: Lethargic Orientation Level: Nonverbal/unable to assess Year: (unable to answer) Month: (unable to answer) Day  of Week: (unable to answer) Memory: (unable to answer) Immediate Memory Recall: (unable to answer) Attention: Focused;Sustained Focused Attention: Appears intact Sustained Attention: Impaired Awareness: Impaired Problem Solving: Impaired Safety/Judgment: Impaired Comments: very lethargic this morning. daughter reports this is new and started yesterday  Sensation Sensation Light Touch: Impaired by gross assessment Proprioception: Impaired by gross assessment Additional Comments: pt too lethargic to repsond consistently to formal testing Coordination Gross Motor Movements are Fluid and Coordinated: No Fine Motor Movements are Fluid and Coordinated: No Motor  Motor Motor: Hemiplegia;Abnormal tone;Abnormal postural alignment and control Motor - Skilled Clinical Observations: R lean, decresed orientation to midline; impaired trunk control Mobility  Bed Mobility Bed Mobility: Rolling Right;Rolling Left;Supine to Sit;Sit to Supine Rolling Right: Total Assistance - Patient < 25% Rolling Left: 2 Helpers Supine to Sit: 2 Helpers Sit to Supine: Maximal Assistance - Patient 25-49%  Trunk/Postural Assessment  Cervical Assessment Cervical Assessment: Exceptions to [facility] Thoracic Assessment Thoracic Assessment: Exceptions to [facility] Lumbar Assessment Lumbar Assessment: Exceptions to [facility] Postural Control Postural Control: Deficits on evaluation Trunk Control: impaired; tendency for posterior or lateral lean to R Righting Reactions: delayed and indequate Protective Responses: delayed and inadequate  Balance Balance Balance Assessed: Yes Static Sitting Balance Static Sitting - Level of Assistance: 2: Max assist Dynamic Sitting Balance Dynamic Sitting - Level of Assistance: 2: Max assist;1: +1 Total assist Static Standing Balance Static Standing - Level of Assistance: Not tested (comment) Extremity/Trunk Assessment RUE Assessment Passive Range of Motion (PROM) Comments: WFLs General  Strength Comments: unable to formally assess secondary to lethargy LUE Assessment Passive Range of Motion (PROM) Comments: WFLs General Strength Comments: unable to formally assess secondary to lethargy     Refer to Care Plan for Long Term Goals  Recommendations for other services: Neuropsych   Discharge Criteria: Patient will be discharged from OT if patient refuses treatment 3 consecutive times without medical reason, if treatment goals not met, if there is a change in medical status, if patient makes no progress towards goals or if patient is discharged from hospital.  The above assessment, treatment plan, treatment alternatives and goals were discussed and mutually agreed upon: by patient and by family  Gypsy Decant 09/10/2019, 5:30 PM

## 2019-09-10 NOTE — Progress Notes (Signed)
Initial Nutrition Assessment  DOCUMENTATION CODES:   Obesity unspecified  INTERVENTION:   - May need to consider Cortrak NG tube and tube feeds if pt is not alert enough to take POs  - Magic Cup TID with meals, each supplement provides 290 kcal and 9 grams of protein  - Encourage adequate PO intake and provide feeding assistance  NUTRITION DIAGNOSIS:   Inadequate oral intake related to dysphagia, lethargy/confusion as evidenced by meal completion < 25%.  GOAL:   Patient will meet greater than or equal to 90% of their needs  MONITOR:   Supplement acceptance, Diet advancement, Labs, PO intake, Weight trends  REASON FOR ASSESSMENT:   Malnutrition Screening Tool    ASSESSMENT:   86 year old male with PMH of CAD, COPD, LBP, tremors, AVR. Pt was admitted on 09/04/19 with right facial weakness, dysarthria, right hemiparesis, and left gaze preference. CT head done showing acute left ICH. 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 Pt with resultant dysphagia, aphasia, delayed processing. Pt admitted to CIR on 4/26.   Pt on a Dysphagia 1 diet with honey thick liquids.  Spoke with pt's daughter at bedside who reports pt had a normal appetite PTA and ate 2 meals daily. Pt also snacked between meals. Pt's daughter shares that pt has been maintaining his weight over the last several years between 200-210 lbs. Current weight is 247 lbs. Pt with mild generalized edema.  If pt remains lethargic and unable to take POs, may need to consider Cortrak NG tube placement and tube feeds.  Meal Completion: 0% x 2 recorded meals  Medications reviewed and include: SSI, protonix, senna IVF: NS with KCl @ 50 ml/hr q HS  Labs reviewed. CBG's: 87-102 x 24 hours  NUTRITION - FOCUSED PHYSICAL EXAM:    Most Recent Value  Orbital Region  No depletion  Upper Arm Region  No depletion   Thoracic and Lumbar Region  No depletion  Buccal Region  No depletion  Temple Region  No depletion  Clavicle Bone Region  No depletion  Clavicle and Acromion Bone Region  Mild depletion  Scapular Bone Region  Unable to assess  Dorsal Hand  No depletion  Patellar Region  No depletion  Anterior Thigh Region  No depletion  Posterior Calf Region  No depletion  Edema (RD Assessment)  Mild  Hair  Reviewed  Eyes  Reviewed  Mouth  Reviewed  Skin  Reviewed  Nails  Reviewed       Diet Order:   Diet Order            DIET - DYS 1 Room service appropriate? No; Fluid consistency: Honey Thick  Diet effective now              EDUCATION NEEDS:   No education needs have been identified at this time  Skin:  Skin Assessment: Reviewed RN Assessment  Last BM:  09/09/19  Height:   Ht Readings from Last 1 Encounters:  09/04/19 5\' 11"  (1.803 m)    Weight:   Wt Readings from Last 1 Encounters:  09/09/19 112.3 kg    Ideal Body Weight:  78.2 kg  BMI:  Body mass index is 34.52 kg/m.  Estimated Nutritional Needs:   Kcal:  1800-2000  Protein:  95-110 grams  Fluid:  1.8-2.0 L    Gaynell Face, MS, RD, LDN Inpatient Clinical Dietitian Pager: [phone] Weekend/After Hours: [phone]

## 2019-09-10 NOTE — Progress Notes (Signed)
I have reviewed all images. CT shows sl enlargement of his hemorrhage with associated edema compared to CT from 4/21. CXR may show early infiltrate left base. Will make him NPO except for meds. Begin empiric zosyn to treat potential aspiration. RN will cath for urine specimen. PA has reached out to neurology already re: f/u of CT scan. Have ordered repeat labs for AM.   I spoke to daughter and explained the plan.   Meredith Staggers, MD, [facility] 09/10/2019

## 2019-09-10 NOTE — Progress Notes (Signed)
Called Pt's daughter Narda Rutherford bell) to ask if she was aware for the possible TEE procedure today and she stated. "I knew of it, just didn't know they were going to go through with it." I informed her that the doctor will talk to her about the procedure in order to get consent.

## 2019-09-10 NOTE — Evaluation (Signed)
Speech Language Pathology Bedside Swallow Assessment and Plan  Patient Details  Name: David [name] MRN: [id number] Date of Birth: Feb 20, 1924  SLP Diagnosis: Dysphagia  Rehab Potential: Good ELOS: 2-3 weeks    Today's Date: 09/10/2019 SLP Individual Time: 7048-8891 SLP Individual Time Calculation (min): 25 min  Missed 35 minutes due to pt fatigue   Problem List:  Patient Active Problem List   Diagnosis Date Noted  . Coronary artery disease involving native coronary artery of native heart without angina pectoris   . Chronic obstructive pulmonary disease ([place])   . Tremors of nervous system   . S/P AVR   . Thrombocytopenia ([place])   . Global aphasia   . ICH (intracerebral hemorrhage) ([place]) 09/04/2019  . Tremor 05/20/2019  . 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 Chest pain, atypical 09/20/2018  . Onychomycosis 06/06/2018  . Acute pain of left knee 08/18/2017  . Wart 06/05/2017  . Acute upper respiratory infection 05/12/2016  . Skin irritation from shaving 12/02/2015  . Benign paroxysmal positional vertigo 03/09/2015  . Plantar fasciitis, bilateral 12/02/2014  . Constipation 07/17/2014  . Cough 04/09/2014  . S/P AVR (aortic valve replacement) 11/04/2013  . Elevated hemoglobin ([place]) 12/14/2012  . Well adult exam 11/12/2012  . 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 Impaired glucose tolerance 02/14/2011  . Edema 10/22/2010  . Actinic keratoses 10/22/2010  . THROMBOCYTOPENIA 01/22/2010  . MELANOMA 03/05/2009  . TOBACCO USE, QUIT 02/27/2009  . Neoplasm of uncertain behavior of skin 05/08/2007  . Dyslipidemia 02/08/2007  . Essential hypertension 02/08/2007  . Coronary atherosclerosis 02/08/2007  . COPD 02/08/2007  . GERD 02/08/2007  . OSTEOARTHRITIS 02/08/2007  . LOW BACK PAIN 02/08/2007   Past Medical History:  Past Medical History:  Diagnosis Date  . CAD (coronary artery disease)    intolerance of statins  . COPD (chronic obstructive pulmonary disease) ([place])   .  Current use of long term anticoagulation   . GERD (gastroesophageal reflux disease)   . Hyperlipidemia   . Hypertension   . Impaired glucose tolerance 02/14/2011  . LBP (low back pain)   . OA (osteoarthritis)   . 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   Past Surgical History:  Past Surgical History:  Procedure Laterality Date  . 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 CARDIAC CATHETERIZATION  10/05/1998   Recommend CABG revascularizaition  . CARDIAC CATHETERIZATION  06/11/1999   Continue medical therapy  . CARDIAC CATHETERIZATION  01/08/2007   Patent grafts, recommend medical therapy  . CARDIOVASCULAR STRESS TEST  04/22/2010   Small lateral defect which is partially reversible, no ischemic EKG changes were noted.  Marland Kitchen CAROTID DOPPLER  04/22/2010   Bilateral ICAs-no evidence of diametere reduction, significant tortuosity, or any other vascular abnormality.  . CORONARY ARTERY BYPASS GRAFT  10/09/1998   LIMA to LAD, SVG to diag, SVG sequential to 2 marginals, SVG sequential to PDA & PLA. CE#25 porcine AVR.  Marland Kitchen TRANSTHORACIC ECHOCARDIOGRAM  05/14/2012   EF 50-55%, bioprosthesis of the aortic valve with well preserved LV function, moderate concentric Byars    Assessment / Plan / Recommendation Clinical Impression BEDSIDE SWALLOW EVALUATION:  Pt was seen for bedside swallow evaluation to determine appropriateness to continue puree and honey thick liquids, given significant choking episode yesterday (today pt has been NPO for possible TEE, which was cancelled). Oral care was completed with suction. Unable to complete thorough CN exam, as pt was unable to follow commands consistently, even with visual  and tactile cues. Right facial weakness noted at rest. Pt speech was noted to be largely unintelligible, with occasional intelligible words (yes, alright, pt's name). Pt accepted ice chip x1, and demonstrated adequate oral prep and clearing. No cough response noted after the  swallow. Pt accepted one bite of "magic cup", and was noted to begin coughing immediately following the swallow. No further po trials were given at this time, given high aspiration risk. SLP will continue to follow to assess readiness for po intake, or appropriateness to repeat instrumental study.   COGNITIVE LINGUISTIC EVALUATION: Attempted evaluation later in the afternoon. Pt was insufficiently arousable for valid and reliable cognitive linguistic assessment. Will continue efforts.   Skilled Therapeutic Interventions          BSE  SLP Assessment  Patient will need skilled Speech Language Pathology Services during CIR admission    Recommendations  SLP Diet Recommendations: NPO Medication Administration: Via alternative means Oral Care Recommendations: Oral care BID Patient destination: (TBD) Follow up Recommendations: (TBD)    SLP Frequency 3 to 5 out of 7 days   SLP Duration  SLP Intensity  SLP Treatment/Interventions 2-3 weeks  Minumum of 1-2 x/day, 30 to 90 minutes  Oral motor exercises;Patient/family education    Pain Pain Assessment Pain Scale: Faces Faces Pain Scale: Hurts a little bit  Prior Functioning Type of Home: House  Lives With: Daughter Available Help at Discharge: Family;Available 24 hours/day Vocation: Retired(worked for the [employer])  SLP Evaluation Cognition Overall Cognitive Status: Impaired/Different from baseline Arousal/Alertness: Lethargic Unable to fully assess cognition due to significant communication deficits and lethargy   Comprehension Difficulty following simple commands despite multimodal cues  Expression Speech is largely unintelligible, however, pt is able to verbalize his name.  Oral Motor Unable to complete CN exam, due to inability to follow verbal commands.   Bedside Swallowing Assessment General Date of Onset: 09/04/19 Previous Swallow Assessment: BSE 4/22, MBS 4/23 Diet Prior to this Study: Dysphagia 1  (puree);Honey-thick liquids Temperature Spikes Noted: (low grade - 99.5) Respiratory Status: Room air History of Recent Intubation: No Behavior/Cognition: Lethargic/Drowsy;Requires cueing;Doesn't follow directions Oral Cavity - Dentition: Adequate natural dentition;Missing dentition Self-Feeding Abilities: Total assist Vision: Impaired for self-feeding Patient Positioning: Upright in bed Baseline Vocal Quality: Low vocal intensity Volitional Cough: Cognitively unable to elicit Volitional Swallow: Unable to elicit  Oral Care Assessment Does patient have any of the following "high(er) risk" factors?: None of the above Does patient have any of the following "at risk" factors?: Other - dysphagia;Diet - patient on thickened liquids Patient is AT RISK: Order set for Adult Oral Care Protocol initiated -  "At Risk Patients" option selected (see row information) Ice Chips Ice chips: Within functional limits Presentation: Spoon Thin Liquid Thin Liquid: Not tested Nectar Thick Nectar Thick Liquid: Not tested Honey Thick Honey Thick Liquid: Impaired Presentation: Spoon Oral Phase Impairments: Poor awareness of bolus Pharyngeal Phase Impairments: Cough - Immediate Puree Puree: Not tested Solid Solid: Not tested BSE Assessment Risk for Aspiration Impact on safety and function: Moderate aspiration risk;Risk for inadequate nutrition/hydration Other Related Risk Factors: Cognitive impairment;Previous CVA  Short Term Goals: Week 1: SLP Short Term Goal 1 (Week 1): Pt will demonstrate readiness for safe po intake  Refer to Care Plan for Long Term Goals  Recommendations for other services: None   Discharge Criteria: Patient will be discharged from SLP if patient refuses treatment 3 consecutive times without medical reason, if treatment goals not met, if there is a change in  medical status, if patient makes no progress towards goals or if patient is discharged from hospital.  The above  assessment, treatment plan, treatment alternatives and goals were discussed and mutually agreed upon: by family  Uriyah Raska B. Quentin Ore, [facility], CCC-SLP Speech Language Pathologist  Shonna Chock 09/10/2019, 2:28 PM

## 2019-09-10 NOTE — Care Management (Addendum)
[facility] Individual Statement of Services  Patient Name:  David [name]  Date:  09/10/2019  Welcome to the [facility].  Our goal is to provide you with an individualized program based on your diagnosis and situation, designed to meet your specific needs.  With this comprehensive rehabilitation program, you will be expected to participate in at least 3 hours of rehabilitation therapies Monday-Friday, with modified therapy programming on the weekends.  Your rehabilitation program will include the following services:  Physical Therapy (PT), Occupational Therapy (OT), Speech Therapy (ST), 24 hour per day rehabilitation nursing, Case Management (Social Worker), Rehabilitation Medicine, Nutrition Services, Pharmacy Services and Other  Weekly team conferences will be held on Tuesdays to discuss your progress.  Your Social Worker will talk with you frequently to get your input and to update you on team discussions.  Team conferences with you and your family in attendance may also be held.  Expected length of stay: 22-28 Days  Overall anticipated outcome: Min Assist   Depending on your progress and recovery, your program may change. Your Social Worker will coordinate services and will keep you informed of any changes. Your Social Worker's name and contact numbers are listed  below.  The following services may also be recommended but are not provided by the [facility]:    [place] will be made to provide these services after discharge if needed.  Arrangements include referral to agencies that provide these services.  Your insurance has been verified to be:  Medicare Your primary doctor is:  Tyrone Apple Plotnikov  Pertinent information will be shared with your doctor and your insurance company.  Social Worker:  Erlene Quan, [place] or ([phone]   Information discussed with and copy given to patient by: Dyanne Iha, 09/10/2019, 9:38 AM

## 2019-09-10 NOTE — Progress Notes (Signed)
Pharmacy Antibiotic Note  David [name] is a 86 y.o. male admitted on 09/09/2019 with pneumonia.  Pharmacy has been consulted for zosyn dosing.  Pt has been here for ICH. CXR today showed atalectasis vs PNA. Concern for aspiration. Zosyn ordered empirically.   Scr 1.37 CrCl 43 ml/min  Plan: Zosyn 3.375g IV x1 then q8 extended infusion   Weight: 112.3 kg (247 lb 8.2 oz)  Temp (24hrs), Avg:99.2 F (37.3 C), Min:98.4 F (36.9 C), Max:100 F (37.8 C)  Recent Labs  Lab 09/06/19 0520 09/07/19 0438 09/08/19 0326 09/09/19 0259 09/10/19 0610  WBC 6.4 7.8 7.9 7.0 6.9  CREATININE 1.31* 1.51* 1.37* 1.22 1.35*    Estimated Creatinine Clearance: 42.6 mL/min (A) (by C-G formula based on SCr of 1.35 mg/dL (H)).    Allergies  Allergen Reactions  . Atorvastatin Other (See Comments)    Unknown reaction  . Colesevelam Other (See Comments)    Unknown reaction  . Simvastatin Other (See Comments)    Unknown reaction  . Sulfa Antibiotics Other (See Comments)    Unknown reaction    Antimicrobials this admission: 4/24 zosyn>>  Dose adjustments this admission:   Microbiology results: 4/27 urine>>  Onnie Boer, PharmD, [place], AAHIVP, CPP Infectious Disease Pharmacist 09/10/2019 6:34 PM

## 2019-09-10 NOTE — Patient Care Conference (Signed)
Inpatient RehabilitationTeam Conference and Plan of Care Update Date: 09/10/2019   Time: 10:50 AM    Patient Name: David [name]      Medical Record Number: [id number]  Date of Birth: 1923-10-17 Sex: Male         Room/Bed: 4W06C/4W06C-01 Payor Info: Payor: MEDICARE / Plan: MEDICARE PART A AND B / Product Type: *No Product type* /    Admit Date/Time:  09/09/2019  9:40 PM  Primary Diagnosis:  <principal problem not specified>  Patient Active Problem List   Diagnosis Date Noted  . Coronary artery disease involving native coronary artery of native heart without angina pectoris   . Chronic obstructive pulmonary disease ([place])   . Tremors of nervous system   . S/P AVR   . Thrombocytopenia ([place])   . Global aphasia   . ICH (intracerebral hemorrhage) ([place]) 09/04/2019  . Tremor 05/20/2019  . 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 Chest pain, atypical 09/20/2018  . Onychomycosis 06/06/2018  . Acute pain of left knee 08/18/2017  . Wart 06/05/2017  . Acute upper respiratory infection 05/12/2016  . Skin irritation from shaving 12/02/2015  . Benign paroxysmal positional vertigo 03/09/2015  . Plantar fasciitis, bilateral 12/02/2014  . Constipation 07/17/2014  . Cough 04/09/2014  . S/P AVR (aortic valve replacement) 11/04/2013  . Elevated hemoglobin ([place]) 12/14/2012  . Well adult exam 11/12/2012  . 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 Impaired glucose tolerance 02/14/2011  . Edema 10/22/2010  . Actinic keratoses 10/22/2010  . THROMBOCYTOPENIA 01/22/2010  . MELANOMA 03/05/2009  . TOBACCO USE, QUIT 02/27/2009  . Neoplasm of uncertain behavior of skin 05/08/2007  . Dyslipidemia 02/08/2007  . Essential hypertension 02/08/2007  . Coronary atherosclerosis 02/08/2007  . COPD 02/08/2007  . GERD 02/08/2007  . OSTEOARTHRITIS 02/08/2007  . LOW BACK PAIN 02/08/2007    Expected Discharge Date: Expected Discharge Date: (Estimated LOS 3 to 3 1/2 weeks)  Team Members Present: Physician  leading conference: Dr. Alger Simons Care Coodinator Present: Karene Fry, RN, MSN;Christina Sampson Goon, BSW;Auria Barbra Sarks, [place] Nurse Present: Rayne Du, LPN PT Present: Deniece Ree, PT OT Present: Cherylynn Ridges, OT SLP Present: Weston Anna, SLP PPS Coordinator present : Ileana Ladd, Burna Mortimer, SLP     Current Status/Progress Goal Weekly Team Focus  Bowel/Bladder   Incontinent of bowel/bladder; Has condom cath  To help be more continent  Assess his need to use the bathroom q2   Swallow/Nutrition/ Hydration   Eval Pending         ADL's   Total A +2  Min/mod A overall  sitting tolerance, bed mobility, self-care retraining, attention, initiation, awareness, bed mobility, transfers   Mobility   max to total +2; limited evaluation due to lethargy this AM  min to mod assist overall  bed mobility, transfers, sit <> stands, L NMR, balance re-training, reorientation to midline, cognitive remediation   Communication   Eval Pending         Safety/Cognition/ Behavioral Observations  Eval Pending         Pain   No complaints of pain  To remain pain free  Assess pain q shift or prn   Skin   Has ecchymosis on right inner thigh, bilateral arms; MASD on sacrum  To prevent any further skin breakdown  Assess skin q shift or as needed    Rehab Goals Patient on target to meet rehab goals: Yes Rehab Goals Revised: N/a *See Care Plan and progress notes for long and short-term goals.  Barriers to Discharge  Current Status/Progress Possible Resolutions Date Resolved   Nursing  Incontinence               PT  Incontinence;Decreased caregiver support  daughter is available 24/7; can't do more than min physical assist (potential barrier if pt doesn't make progress)              OT Medical stability  lethargy             SLP                SW Medical stability              Discharge Planning/Teaching Needs:  Daughter plans to discharge patient back home to  provide care  Will schedule family education, if reccommended   Team Discussion: MD L BG hemorrhage, R hemi, lethargic, low grade temp, monitoring labs, dysarthric, mild confusion, wife supportive, IT fluids at night.  RN lethargic, inc, blister on penis, condom cath.  OT eval pending.  PT total A +2.  SLP eval pending.   Revisions to Treatment Plan: N/A     Medical Summary Current Status: left BH ICH, right hemiparesis, more lethargic, very dysarthric. bp controlled. dysphagia on D1/honey Weekly Focus/Goal: address admission medical issues  Barriers to Discharge: Medical stability   Possible Resolutions to Barriers: see medical progress notes   Continued Need for Acute Rehabilitation Level of Care: The patient requires daily medical management by a physician with specialized training in physical medicine and rehabilitation for the following reasons: Direction of a multidisciplinary physical rehabilitation program to maximize functional independence : Yes Medical management of patient stability for increased activity during participation in an intensive rehabilitation regime.: Yes Analysis of laboratory values and/or radiology reports with any subsequent need for medication adjustment and/or medical intervention. : Yes   I attest that I was present, lead the team conference, and concur with the assessment and plan of the team.   Jodell Cipro M 09/11/2019, 2:05 PM   Team conference was held via web/ teleconference due to [place] - 19

## 2019-09-10 NOTE — Progress Notes (Addendum)
Inpatient Rehabilitation Medication Review by a Pharmacist  A complete drug regimen review was completed for this patient to identify any potential clinically significant medication issues.  Clinically significant medication issues were identified:  yes   Type of Medication Issue Identified Description of Issue Plan Plan Accepted by Provider? (Yes / No)  Drug Interaction(s) (clinically significant)      Duplicate Therapy      Allergy      No Medication Administration End Date      Incorrect Dose      Additional Drug Therapy Needed      Other   Zetia not ordered as mentioned in [facility] 4/26 note   order zetia 10 qday  yes    Name of provider notified for issues identified: Algis Liming, PA  Provider Method of Notification: Secure chat  Pharmacist comments: order zetia 10 mg qday  Time spent performing this drug regimen review (minutes):  [place], Pharm.D [phone] 09/10/2019 1:59 PM

## 2019-09-10 NOTE — Progress Notes (Signed)
Social Work Patient ID: David [name], male   DOB: 1923-10-06, 86 y.o.   MRN: [id number]  Sw enteredoom, daughter at bedside. Patient asleep. Provided team conference update to daughter David [name]. Informed her of current MD, therapy and nursing updates. Daughter wanting know when MRI will take place, followed up with MD and orders have been placed. Informed sister and she was ok. Informed her of therapy wanting to work with patient at least 2-3 weeks,she agreed. Will follow up with any question or concerns.

## 2019-09-10 NOTE — Progress Notes (Addendum)
Pt still very lethargic at noon today. Daughter notes that abdomen still more distended than usual as well. ? Whether he's had a substantial bm since being in the hospital.   Will add HCT and KUB to CXR already ordered.   Can participate with therapies as tolerated.

## 2019-09-10 NOTE — Progress Notes (Signed)
KUB negative for obstruction. CXR with question of edema v/s PNA--reported to have problems with lunch yesterday.  CBC stable but with low grade fever. BLE dopplers pending. CT head done showed some increase in bleed with question of increase in edema v/s extension of stroke. Reached out to neurology for evaluation and input.

## 2019-09-10 NOTE — Plan of Care (Signed)
  Problem: Consults Goal: RH GENERAL PATIENT EDUCATION Description: See Patient Education module for education specifics. Outcome: Progressing Goal: Skin Care Protocol Initiated - if Braden Score 18 or less Description: Patients skin will be free from break down Outcome: Progressing Goal: Nutrition Consult-if indicated Outcome: Progressing Goal: Diabetes Guidelines if Diabetic/Glucose > 140 Description: If diabetic or lab glucose is > 140 mg/dl - Initiate Diabetes/Hyperglycemia Guidelines & Document Interventions  Outcome: Progressing   Problem: RH BOWEL ELIMINATION Goal: RH STG MANAGE BOWEL WITH ASSISTANCE Description: STG Manage Bowel with mod I Assistance. Outcome: Progressing Goal: RH STG MANAGE BOWEL W/MEDICATION W/ASSISTANCE Description: STG Manage Bowel with Medication with mod I Assistance. Outcome: Progressing   Problem: RH BLADDER ELIMINATION Goal: RH STG MANAGE BLADDER WITH ASSISTANCE Description: STG Manage Bladder With mod I Assistance Outcome: Progressing   Problem: RH SKIN INTEGRITY Goal: RH STG SKIN FREE OF INFECTION/BREAKDOWN Description: Patient will be free from skin break down Outcome: Progressing Goal: RH STG MAINTAIN SKIN INTEGRITY WITH ASSISTANCE Description: STG Maintain Skin Integrity With mod I Assistance. Outcome: Progressing Goal: RH STG ABLE TO PERFORM INCISION/WOUND CARE W/ASSISTANCE Description: STG Able To Perform Incision/Wound Care With mod I Assistance. Outcome: Progressing   Problem: RH SAFETY Goal: RH STG ADHERE TO SAFETY PRECAUTIONS W/ASSISTANCE/DEVICE Description: STG Adhere to Safety Precautions With Assistance/Device. Outcome: Progressing   Problem: RH PAIN MANAGEMENT Goal: RH STG PAIN MANAGED AT OR BELOW PT'S PAIN GOAL Description: Pain scale <3/10 Outcome: Progressing   Problem: RH KNOWLEDGE DEFICIT GENERAL Goal: RH STG INCREASE KNOWLEDGE OF SELF CARE AFTER HOSPITALIZATION Outcome: Progressing   Problem: Consults Goal: RH  STROKE PATIENT EDUCATION Description: See Patient Education module for education specifics  Outcome: Progressing   Problem: RH KNOWLEDGE DEFICIT Goal: RH STG INCREASE KNOWLEDGE OF HYPERTENSION Description: Patient will be able to verbalize anti-hypertensive medication & side effects by discharge Outcome: Progressing Goal: RH STG INCREASE KNOWLEDGE OF DYSPHAGIA/FLUID INTAKE Description: Patient will be able to verbalize need for specialty diet Outcome: Progressing Goal: RH STG INCREASE KNOWLEDGE OF STROKE PROPHYLAXIS Description: Patient will be able to verbalize medications that are for stroke prophylaxis Outcome: Progressing

## 2019-09-10 NOTE — Progress Notes (Signed)
[place] PHYSICAL MEDICINE & REHABILITATION PROGRESS NOTE   Subjective/Complaints: Nurse reports an unremarkable night. Patient slow to arouse this morning.   ROS: Limited due to cognitive/behavioral    Objective:   No results found. Recent Labs    09/09/19 0259 09/10/19 0610  WBC 7.0 6.9  HGB 13.4 14.0  HCT 39.6 41.2  PLT 73* 76*   Recent Labs    09/09/19 0259 09/10/19 0610  NA 137 136  K 3.7 3.6  CL 110 107  CO2 21* 23  GLUCOSE 103* 107*  BUN 17 13  CREATININE 1.22 1.35*  CALCIUM 8.1* 8.2*    Intake/Output Summary (Last 24 hours) at 09/10/2019 0946 Last data filed at 09/10/2019 0500 Gross per 24 hour  Intake 241.78 ml  Output 425 ml  Net -183.22 ml     Physical Exam: Vital Signs Blood pressure 126/66, pulse 73, temperature 99.5 F (37.5 C), temperature source Oral, resp. rate 19, weight 112.3 kg, SpO2 97 %.  Constitutional: No distress . Vital signs reviewed. HEENT: EOMI, oral membranes moist Neck: supple Cardiovascular: RRR without murmur. No JVD    Respiratory/Chest: CTA Bilaterally without wheezes or rales. Normal effort    GI/Abdomen: BS +, non-tender,  distended Ext: no clubbing, cyanosis, or edema Psych: pleasant and cooperative Musculoskeletal:  General: No deformityor edema.  Cervical back: Normal range of motion.  Neurological: lethargic. Opens eyes to verbal and tactile cues. Speech very garbled. Occasionally appropriately answers to y/n. RUE with sl flexor tone and extensor tone RLE. Moves LUE and LLE more automatically. Assisted with transfer using left side. Senses pain Skin: Skin iswarm.abrasion right forehead  Psychiatric: Flat, slow to engage   Assessment/Plan: 1. Functional deficits secondary to left BG ICH which require 3+ hours per day of interdisciplinary therapy in a comprehensive inpatient rehab setting.  Physiatrist is providing close team supervision and 24 hour management of active medical problems listed  below.  Physiatrist and rehab team continue to assess barriers to discharge/monitor patient progress toward functional and medical goals  Care Tool:  Bathing              Bathing assist       Upper Body Dressing/Undressing Upper body dressing Upper body dressing/undressing activity did not occur (including orthotics): N/A What is the patient wearing?: Hospital gown only    Upper body assist      Lower Body Dressing/Undressing Lower body dressing            Lower body assist       Toileting Toileting    Toileting assist       Transfers Chair/bed transfer  Transfers assist           Locomotion Ambulation   Ambulation assist              Walk 10 feet activity   Assist           Walk 50 feet activity   Assist           Walk 150 feet activity   Assist           Walk 10 feet on uneven surface  activity   Assist           Wheelchair     Assist               Wheelchair 50 feet with 2 turns activity    Assist            Wheelchair 150 feet  activity     Assist          Blood pressure 126/66, pulse 73, temperature 99.5 F (37.5 C), temperature source Oral, resp. rate 19, weight 112.3 kg, SpO2 97 %.  Medical Problem List and Plan: 1.Functional, mobility and cognitive deficitssecondary to left basal ganglia hemorrhage -patient may shower  -beginning therapies today -ELOS/Goals: 22-28 days/min assist with PT, OT, SLP  -more lethargic this morning.    -labs reviewed and reasonable   -afebrile, slept well, no sx of infection   -if he doesn't perk up this morning, consider f/u HCT 2. Antithrombotics: -DVT/anticoagulation:Mechanical:Sequential compression devices, below kneeBilateral lower extremities--platelets only 75k -antiplatelet therapy: N/A 3.Chronic LBP/Pain Management:Tylenol prn. 4. Mood:LCSW to follow for evaluation and  support. -antipsychotic agents: N/A 5. Neuropsych: This patientis notcapable of making decisions on hisown behalf. 6. Skin/Wound Care:Routine pressure relief measures. 7. Fluids/Electrolytes/Nutrition:encourage PO as able  -continue IVF at HS  8. HTN: currently controlled off medications.SBP goal<140--will add prn catapres 9.H/oCOPD:Stable without meds. 10. Impaired glucose tolerance:CBG's wnl so far.  - SSI as indicated. 11. CAD/AVR:Off ASA due to bleed. No statin due to intolerance. Tenormin, cozaar and lasix (used prn) on hold.  -TEE was felt not to be indicated on acute, yet is scheduled this morning.    -we are following up with cardiology 12. Thrombocytopenia: count stable at 76k today  13. AKI: sl elevation today -continue IVF hs especially if he remains lethargic 14. Dysphagia: On dysphagia 1, honey liquids--continue  IVF for hydration at nights.             -aspiration precautions 15. Constipation: added Senna at bedtime as no BM since admission.    LOS: 1 days A FACE TO FACE EVALUATION WAS PERFORMED  Meredith Staggers 09/10/2019, 9:46 AM

## 2019-09-10 NOTE — Evaluation (Signed)
Physical Therapy Assessment and Plan  Patient Details  Name: David [name] MRN: [id number] Date of Birth: [birth year]  PT Diagnosis: Difficulty walking, Edema, Hemiplegia (R), Hypertonia, Hypotonia, Impaired cognition, Impaired sensation, Low back pain, Muscle weakness and Pain in joint Rehab Potential: Good ELOS: 21-28 days   Today's Date: 09/10/2019 PT Individual Time: 2248-2500 PT Individual Time Calculation (min): 75 min    Problem List:  Patient Active Problem List   Diagnosis Date Noted  . Coronary artery disease involving native coronary artery of native heart without angina pectoris   . Chronic obstructive pulmonary disease ([place])   . Tremors of nervous system   . S/P AVR   . Thrombocytopenia ([place])   . Global aphasia   . ICH (intracerebral hemorrhage) ([place]) 09/04/2019  . Tremor 05/20/2019  . 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 Chest pain, atypical 09/20/2018  . Onychomycosis 06/06/2018  . Acute pain of left knee 08/18/2017  . Wart 06/05/2017  . Acute upper respiratory infection 05/12/2016  . Skin irritation from shaving 12/02/2015  . Benign paroxysmal positional vertigo 03/09/2015  . Plantar fasciitis, bilateral 12/02/2014  . Constipation 07/17/2014  . Cough 04/09/2014  . S/P AVR (aortic valve replacement) 11/04/2013  . Elevated hemoglobin ([place]) 12/14/2012  . Well adult exam 11/12/2012  . 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 Impaired glucose tolerance 02/14/2011  . Edema 10/22/2010  . Actinic keratoses 10/22/2010  . THROMBOCYTOPENIA 01/22/2010  . MELANOMA 03/05/2009  . TOBACCO USE, QUIT 02/27/2009  . Neoplasm of uncertain behavior of skin 05/08/2007  . Dyslipidemia 02/08/2007  . Essential hypertension 02/08/2007  . Coronary atherosclerosis 02/08/2007  . COPD 02/08/2007  . GERD 02/08/2007  . OSTEOARTHRITIS 02/08/2007  . LOW BACK PAIN 02/08/2007    Past Medical History:  Past Medical History:  Diagnosis Date  . CAD (coronary artery disease)     intolerance of statins  . COPD (chronic obstructive pulmonary disease) ([place])   . Current use of long term anticoagulation   . GERD (gastroesophageal reflux disease)   . Hyperlipidemia   . Hypertension   . Impaired glucose tolerance 02/14/2011  . LBP (low back pain)   . OA (osteoarthritis)   . 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   Past Surgical History:  Past Surgical History:  Procedure Laterality Date  . 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 CARDIAC CATHETERIZATION  10/05/1998   Recommend CABG revascularizaition  . CARDIAC CATHETERIZATION  06/11/1999   Continue medical therapy  . CARDIAC CATHETERIZATION  01/08/2007   Patent grafts, recommend medical therapy  . CARDIOVASCULAR STRESS TEST  04/22/2010   Small lateral defect which is partially reversible, no ischemic EKG changes were noted.  Marland Kitchen CAROTID DOPPLER  04/22/2010   Bilateral ICAs-no evidence of diametere reduction, significant tortuosity, or any other vascular abnormality.  . CORONARY ARTERY BYPASS GRAFT  10/09/1998   LIMA to LAD, SVG to diag, SVG sequential to 2 marginals, SVG sequential to PDA & PLA. CE#25 porcine AVR.  Marland Kitchen TRANSTHORACIC ECHOCARDIOGRAM  05/14/2012   EF 50-55%, bioprosthesis of the aortic valve with well preserved LV function, moderate concentric [facility]    Assessment & Plan Clinical Impression: Patient is a 86 year old male with history of CAD, COPD, LBP, tremors, AVR who was admitted on 04/21/21with rightfacial weakness, dysarthria, right hemiparesis, and left gaze preference. Daughter had left for MD appointment and when she returned an hour later she was lying on the floor between beds. EMS was activated and  CT head done showing acute left ICH. CTA head/neck was negative for vascular malformation and left basal ganglia was negative for spot sign. MRI/MRA of brain done showing stable acute left basal ganglia hemorrhage with remote bilateral basal ganglia lacunar infarcts and advanced  white matter disease. Bleed was felt to be due to small vessel disease versus thrombocytopenia as platelets with 93 at admission.  2D echo was done showing EF of 60 to 65% with mild LVH and bioprosthetic aortic valve as well as 1.1 cm with vegetation attached to the RV side of pulmonic valve. 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 Claiborne Billings. Is patient without fevers or other clinical indication of SBE TEE was not felt to be indicated. MBS was done on 423 showing moderate dysphagia with delayed swallow and trace aspiration of thins--he was placed on dysphagia 1, honey liquids by spoon. Patient with resultant dysphagia, aphasia, delayed processing with difficulty following one-step commands BLE weakness with visual impairments affecting ADLs and mobility. CIR was recommended due to functional decline Patient transferred to CIR on 09/09/2019 .   Patient currently requires total +2 with mobility secondary to muscle weakness, muscle joint tightness and muscle paralysis, decreased cardiorespiratoy endurance, abnormal tone, motor apraxia and decreased motor planning, decreased midline orientation, decreased attention to right and decreased motor planning, decreased initiation, decreased attention, decreased awareness, decreased problem solving, decreased safety awareness, decreased memory and delayed processing and decreased sitting balance, decreased standing balance, decreased postural control, hemiplegia and decreased balance strategies.  Prior to hospitalization, patient was independent with mobility and lived with Daughter in a House home.  Home access is  Ramped entrance.  Patient will benefit from skilled PT intervention to maximize safe functional mobility, minimize fall risk and decrease caregiver burden for planned discharge home with 24 hour assist.   Anticipate patient will benefit from follow up [facility] at discharge.  PT - End of Session Activity Tolerance: Decreased this session Endurance Deficit: Yes PT Assessment Rehab Potential (ACUTE/IP ONLY): Good PT Barriers to Discharge: Incontinence;Decreased caregiver support PT Barriers to Discharge Comments: daughter is available 24/7; can't do more than min physical assist (potential barrier if pt doesn't make progress) PT Patient demonstrates impairments in the following area(s): Balance;Edema;Endurance;Motor;Nutrition;Pain;Perception;Safety;Sensory;Skin Integrity PT Transfers Functional Problem(s): Bed Mobility;Bed to Chair;Car;Furniture PT Locomotion Functional Problem(s): Wheelchair Mobility;Ambulation;Stairs PT Plan PT Intensity: Minimum of 1-2 x/day ,45 to 90 minutes PT Frequency: 5 out of 7 days PT Duration Estimated Length of Stay: 21-28 days PT Treatment/Interventions: Ambulation/gait training;Balance/vestibular training;Cognitive remediation/compensation;Community reintegration;Discharge planning;Disease management/prevention;DME/adaptive equipment instruction;Functional mobility training;Neuromuscular re-education;Pain management;Patient/family education;Psychosocial support;Functional electrical stimulation;Skin care/wound management;Splinting/orthotics;Stair training;Therapeutic Activities;UE/LE Coordination activities;UE/LE Strength taining/ROM;Therapeutic Exercise;Visual/perceptual remediation/compensation;Wheelchair propulsion/positioning PT Transfers Anticipated Outcome(s): min to mod assist overall PT Locomotion Anticipated Outcome(s): supervision w/c mobility; mod assist gait PT Recommendation Recommendations for Other Services: Neuropsych consult Follow Up Recommendations: Home health PT;24 hour supervision/assistance Patient destination: Home Equipment Recommended: To be determined Equipment Details: has power scooter from deceased wife; may have some equipment from his  disceased wife. daughter thinks potentially a walker, cane, and w/c  Skilled Therapeutic Intervention Evaluation completed (see details above and below) with education on PT POC and goals and individual treatment initiated with focus on bed mobility retraining for rolling (to change brief due to incontinence), supine <> sit, and NMR EOB for balance retraining, reorientation to midline, attention, and postural control re-training. Pt very lethargic during session, would awaken and open eyes for periods  of time but difficulty maintaining alertness or following of simple commands consistently. Pt requires max to total assist +2 for rolling to change and clean from soiled (urine) brief. Daughter at bedside and willing to provide assistance. Pt requires +2 assist to come to EOB even with HOB elevated and max cues for initiation and technique. EOB pt more awake and able to demonstrate some movement to L side on command though remains inconsistent. Pt initially with posterior bias and R lateral lean. Focused on NMR for midline reorientation, alertness, postural control and trunk control. At times able to achieve balance with close supervision to min assist but up to max needed dynamically and poor ability to weightshift anteriorly despite manual facilitation. Max assist to return to supine and total +2 to reposition in the bed.   Education provided to daughter on importance of RUE positioning, standing on pt's R side to increase attention to the R, and asking simple commands at this time with extra time for processing. Verbalized understanding.  Due to awaiting transport for procedure this morning and pt's lethargic state, did not get up to TIS w/c with lift.  RN made aware that daughter reports patient more lethargic this morning and difficulty with following of commands/participation.    PT Evaluation Precautions/Restrictions Precautions Precautions: Fall Precaution Comments: R hemi; R  inattention Restrictions Weight Bearing Restrictions: No Pain Unable to report pain. Lethargic. Groans with sit to supine and per chart has h/o low back pain. Home Living/Prior Functioning Home Living Living Arrangements: Children(Daughter lives with patient) Available Help at Discharge: Family;Available 24 hours/day Type of Home: House Home Access: Ramped entrance Home Layout: One level Bathroom Accessibility: Yes Additional Comments: daughter able to provide up to min assist  Lives With: Daughter Prior Function Level of Independence: Independent with basic ADLs;Independent with homemaking with ambulation;Independent with gait;Independent with transfers Driving: Yes Vocation: Retired(worked for the [employer]) Vision/Perception  Perception Perception: Impaired Inattention/Neglect: Does not attend to right side of body Praxis Praxis: Impaired Praxis Impairment Details: Initiation;Motor planning  Cognition Overall Cognitive Status: Impaired/Different from baseline Arousal/Alertness: Lethargic Orientation Level: Oriented to person Attention: Focused;Sustained Focused Attention: Appears intact Sustained Attention: Impaired Awareness: Impaired Problem Solving: Impaired Safety/Judgment: Impaired Comments: very lethargic this morning. daughter reports this is new and started yesterday  Sensation Sensation Light Touch: Impaired by gross assessment Proprioception: Impaired by gross assessment Additional Comments: pt too lethargic to repsond consistently to formal testing Coordination Gross Motor Movements are Fluid and Coordinated: No Fine Motor Movements are Fluid and Coordinated: No Motor  Motor Motor: Hemiplegia;Abnormal tone;Abnormal postural alignment and control Motor - Skilled Clinical Observations: R lean, decresed orientation to midline; impaired trunk control  Mobility Bed Mobility Bed Mobility: Rolling Right;Rolling Left;Supine to Sit;Sit to Supine Rolling  Right: Total Assistance - Patient < 25% Rolling Left: 2 Helpers Supine to Sit: 2 Helpers Sit to Supine: Maximal Assistance - Patient 25-49% Transfers Transfers: Transfer via Public house manager (Assistive device): (did not assess d/t lethargy & awaiting transport for procedu) Artist / Additional Locomotion Stairs: No Wheelchair Mobility Wheelchair Mobility: No  Trunk/Postural Assessment  Cervical Assessment Cervical Assessment: Exceptions to WFL(L gaze preference) Thoracic Assessment Thoracic Assessment: Exceptions to WFL(flexed posture) Lumbar Assessment Lumbar Assessment: Exceptions to [facility] Postural Control Postural Control: Deficits on evaluation Trunk Control: impaired; tendency for posterior or lateral lean to R Righting Reactions: delayed and indequate Protective Responses: delayed and inadequate  Balance Balance Balance Assessed: Yes Static Sitting Balance Static Sitting - Level of Assistance: 2: Max  assist(posterior bias) Dynamic Sitting Balance Dynamic Sitting - Level of Assistance: 2: Max assist;1: +1 Total assist Static Standing Balance Static Standing - Level of Assistance: Not tested (comment) Dynamic Standing Balance Dynamic Standing - Level of Assistance: Not tested (comment) Extremity Assessment      RLE Assessment RLE Assessment: Exceptions to [facility] General Strength Comments: no active movement noted in RLE; unable to follow simple commands consistently; RLE Tone RLE Tone Comments: difficult to assess; may have some PF/inversion tone emerging LLE Assessment LLE Assessment: Exceptions to [facility] General Strength Comments: occasional gross movements noted with 3-/5 knee extension seated EOB; 2-/5 hip flexion; lethargic and difficulty with following simple commands    Refer to Care Plan for Long Term Goals  Recommendations for other services: Neuropsych and Therapeutic Recreation  Outing/community reintegration  Discharge Criteria: Patient  will be discharged from PT if patient refuses treatment 3 consecutive times without medical reason, if treatment goals not met, if there is a change in medical status, if patient makes no progress towards goals or if patient is discharged from hospital.  The above assessment, treatment plan, treatment alternatives and goals were discussed and mutually agreed upon: by patient and by family  Juanna Cao, PT, DPT, CBIS  09/10/2019, 1:22 PM

## 2019-09-11 ENCOUNTER — Inpatient Hospital Stay (HOSPITAL_COMMUNITY): Payer: Medicare Other

## 2019-09-11 ENCOUNTER — Inpatient Hospital Stay (HOSPITAL_COMMUNITY): Payer: Medicare Other | Admitting: Speech Pathology

## 2019-09-11 ENCOUNTER — Inpatient Hospital Stay (HOSPITAL_COMMUNITY): Payer: Medicare Other | Admitting: Occupational Therapy

## 2019-09-11 ENCOUNTER — Inpatient Hospital Stay (HOSPITAL_COMMUNITY): Payer: Medicare Other | Admitting: Physical Therapy

## 2019-09-11 DIAGNOSIS — J690 Pneumonitis due to inhalation of food and vomit: Secondary | ICD-10-CM

## 2019-09-11 DIAGNOSIS — I619 Nontraumatic intracerebral hemorrhage, unspecified: Secondary | ICD-10-CM

## 2019-09-11 DIAGNOSIS — I69320 Aphasia following cerebral infarction: Secondary | ICD-10-CM

## 2019-09-11 LAB — CULTURE, BLOOD (ROUTINE X 2)
Culture: NO GROWTH
Culture: NO GROWTH
Special Requests: ADEQUATE

## 2019-09-11 LAB — URINE CULTURE: Culture: NO GROWTH

## 2019-09-11 LAB — URINALYSIS, COMPLETE (UACMP) WITH MICROSCOPIC
Bilirubin Urine: NEGATIVE
Glucose, UA: NEGATIVE mg/dL
Ketones, ur: 20 mg/dL — AB
Leukocytes,Ua: NEGATIVE
Nitrite: NEGATIVE
Protein, ur: 30 mg/dL — AB
RBC / HPF: >50 RBC/hpf — ABNORMAL HIGH (ref 0–5)
Specific Gravity, Urine: 1.028 (ref 1.005–1.030)
pH: 5 (ref 5.0–8.0)

## 2019-09-11 LAB — CBC
HCT: 46 % (ref 39.0–52.0)
Hemoglobin: 15.5 g/dL (ref 13.0–17.0)
MCH: 30.3 pg (ref 26.0–34.0)
MCHC: 33.7 g/dL (ref 30.0–36.0)
MCV: 89.8 fL (ref 80.0–100.0)
Platelets: 79 10*3/uL — ABNORMAL LOW (ref 150–400)
RBC: 5.12 MIL/uL (ref 4.22–5.81)
RDW: 13 % (ref 11.5–15.5)
WBC: 9.8 10*3/uL (ref 4.0–10.5)
nRBC: 0 % (ref 0.0–0.2)

## 2019-09-11 LAB — GLUCOSE, CAPILLARY
Glucose-Capillary: 103 mg/dL — ABNORMAL HIGH (ref 70–99)
Glucose-Capillary: 101 mg/dL — ABNORMAL HIGH (ref 70–99)
Glucose-Capillary: 89 mg/dL (ref 70–99)
Glucose-Capillary: 89 mg/dL (ref 70–99)

## 2019-09-11 LAB — BASIC METABOLIC PANEL
Anion gap: 12 (ref 5–15)
BUN: 17 mg/dL (ref 8–23)
CO2: 17 mmol/L — ABNORMAL LOW (ref 22–32)
Calcium: 8.4 mg/dL — ABNORMAL LOW (ref 8.9–10.3)
Chloride: 106 mmol/L (ref 98–111)
Creatinine, Ser: 1.31 mg/dL — ABNORMAL HIGH (ref 0.61–1.24)
GFR calc Af Amer: 54 mL/min — ABNORMAL LOW (ref >60)
GFR calc non Af Amer: 46 mL/min — ABNORMAL LOW (ref >60)
Glucose, Bld: 95 mg/dL (ref 70–99)
Potassium: 4 mmol/L (ref 3.5–5.1)
Sodium: 135 mmol/L (ref 135–145)

## 2019-09-11 MED ORDER — TRIAMCINOLONE 0.1 % CREAM:EUCERIN CREAM 1:1
TOPICAL_CREAM | Freq: Two times a day (BID) | CUTANEOUS | Status: DC
  Administered 2019-09-13 – 2019-10-05 (×4): 1 via TOPICAL
  Filled 2019-09-11 (×2): qty 1

## 2019-09-11 NOTE — Progress Notes (Signed)
[place] PHYSICAL MEDICINE & REHABILITATION PROGRESS NOTE   Subjective/Complaints: Uneventful night. Pt lying in bed with eyes open. Doesn't indicate any distress  ROS: Limited due to cognitive/behavioral     Objective:   DG Chest 2 View  Result Date: 09/10/2019 CLINICAL DATA:  Fever EXAM: CHEST - 2 VIEW COMPARISON:  09/04/2019 FINDINGS: Prior CABG and aortic valve replacement. The heart size and mediastinal contours are stable. Low lung volumes. Increased interstitial markings within the left lung base, slightly progressed from prior. No pneumothorax. IMPRESSION: Increased interstitial markings within the left lung base, slightly progressed from prior. Findings could represent atelectasis versus pneumonia. Electronically Signed   By: Davina Poke D.O.   On: 09/10/2019 16:33   DG Abd 1 View  Result Date: 09/10/2019 CLINICAL DATA:  Abdominal distension. EXAM: ABDOMEN - 1 VIEW COMPARISON:  None. FINDINGS: The bowel gas pattern is normal. Residual contrast is noted in the colon. No radio-opaque calculi or other significant radiographic abnormality are seen. IMPRESSION: No evidence of bowel obstruction or ileus. Electronically Signed   By: Marijo Conception M.D.   On: 09/10/2019 16:32   CT HEAD WO CONTRAST  Result Date: 09/10/2019 CLINICAL DATA:  Follow-up left basal ganglia hemorrhage. Increased lethargy. EXAM: CT HEAD WITHOUT CONTRAST TECHNIQUE: Contiguous axial images were obtained from the base of the skull through the vertex without intravenous contrast. COMPARISON:  09/04/2019 FINDINGS: Brain: Slight enlargement of the left basal ganglia intraparenchymal hemorrhage, measuring 4.4 x 2.5 x 3.6 cm (volume = 21 cm^3) today compared with 16 cc volume 6 days ago. Additionally, there is intraventricular penetration, having breach the atrium of the left lateral ventricle, with a small amount of blood dependent in both occipital horns. Ventricular size remains stable however without  hydrocephalus. Surrounding edema is slightly more pronounced. One could question if there has been extension of infarction more anteriorly within the basal ganglia and external capsule or if this relates to edema from the hemorrhage. Other small-vessel ischemic changes throughout brain appear stable. No new large vessel infarction. No extra-axial collection. Vascular: There is atherosclerotic calcification of the major vessels at the base of the brain. Skull: Negative Sinuses/Orbits: Clear/normal Other: None IMPRESSION: Slight enlargement of the left basal ganglia intraparenchymal hemorrhage when compared to the study of 6 days ago. Previous volume was calculated at 16 cc. Today calculated at 21 cc. There is also intraventricular penetration with a small amount of blood dependent in the occipital horns of both lateral ventricles but no hydrocephalus. Slightly more surrounding edema as expected. Some edema extends anterior into the basal ganglia and external capsule region, which could either be extension of edema or slight extension of the infarction itself. These results will be called to the ordering clinician or representative by the Radiologist Assistant, and communication documented in the PACS or [employer]. Electronically Signed   By: Nelson Chimes M.D.   On: 09/10/2019 16:57   Recent Labs    09/10/19 0610 09/11/19 0729  WBC 6.9 9.8  HGB 14.0 15.5  HCT 41.2 46.0  PLT 76* 79*   Recent Labs    09/10/19 0610 09/11/19 0729  NA 136 135  K 3.6 4.0  CL 107 106  CO2 23 17*  GLUCOSE 107* 95  BUN 13 17  CREATININE 1.35* 1.31*  CALCIUM 8.2* 8.4*    Intake/Output Summary (Last 24 hours) at 09/11/2019 0833 Last data filed at 09/11/2019 0319 Gross per 24 hour  Intake 832.88 ml  Output 150 ml  Net 682.88 ml  Physical Exam: Vital Signs Blood pressure (!) 148/66, pulse (!) 57, temperature 98 F (36.7 C), resp. rate 20, weight 112.3 kg, SpO2 98 %.  Constitutional: No distress . Vital  signs reviewed. obese HEENT: EOMI, oral membranes moist Neck: supple Cardiovascular: RRR without murmur. No JVD    Respiratory/Chest: CTA Bilaterally without wheezes or rales. Normal effort    GI/Abdomen: BS +, non-tender, non-distended Ext: no clubbing, cyanosis, or edema Psych: flat, more engaging today though Musculoskeletal:  General: No deformityor edema.  Cervical back: Normal range of motion.  Neurological: more alert. Answered a few y/n questions appropriately. Speech very garbled, aphasic.  RUE with sl flexor tone and extensor tone RLE. Moves LUE and LLE more automatically. Assisted with transfer using left side. Senses pain Skin: Skin iswarm.abrasion right forehead      Assessment/Plan: 1. Functional deficits secondary to left BG ICH which require 3+ hours per day of interdisciplinary therapy in a comprehensive inpatient rehab setting.  Physiatrist is providing close team supervision and 24 hour management of active medical problems listed below.  Physiatrist and rehab team continue to assess barriers to discharge/monitor patient progress toward functional and medical goals  Care Tool:  Bathing        Body parts bathed by helper: Right arm, Left arm, Chest, Abdomen, Front perineal area, Buttocks, Right upper leg, Left upper leg, Right lower leg, Left lower leg, Face     Bathing assist Assist Level: 2 Helpers     Upper Body Dressing/Undressing Upper body dressing Upper body dressing/undressing activity did not occur (including orthotics): N/A What is the patient wearing?: Hospital gown only    Upper body assist Assist Level: Dependent - Patient 0%    Lower Body Dressing/Undressing Lower body dressing      What is the patient wearing?: Incontinence brief     Lower body assist Assist for lower body dressing: Dependent - Patient 0%     Toileting Toileting    Toileting assist Assist for toileting: 2 Helpers     Transfers Chair/bed  transfer  Transfers assist  Chair/bed transfer activity did not occur: Safety/medical concerns        Locomotion Ambulation   Ambulation assist   Ambulation activity did not occur: Safety/medical concerns          Walk 10 feet activity   Assist  Walk 10 feet activity did not occur: Safety/medical concerns        Walk 50 feet activity   Assist Walk 50 feet with 2 turns activity did not occur: Safety/medical concerns         Walk 150 feet activity   Assist Walk 150 feet activity did not occur: Safety/medical concerns         Walk 10 feet on uneven surface  activity   Assist Walk 10 feet on uneven surfaces activity did not occur: Safety/medical concerns         Wheelchair     Assist Will patient use wheelchair at discharge?: Yes Type of Wheelchair: Manual Wheelchair activity did not occur: Safety/medical concerns(lethargy; TIS would be dependent)         Wheelchair 50 feet with 2 turns activity    Assist            Wheelchair 150 feet activity     Assist          Blood pressure (!) 148/66, pulse (!) 57, temperature 98 F (36.7 C), resp. rate 20, weight 112.3 kg, SpO2 98 %.  Medical Problem  List and Plan: 1.Functional, mobility and cognitive deficitssecondary to left basal ganglia hemorrhage -patient may shower  -may participate in therapies as tolerated -ELOS/Goals: 22-28 days/min assist with PT, OT, SLP  -4/28 more alert this morning. HCT with sl interval increase in hemorrhage and edema but last scan was a week ago. Continue to observe for now, neurology agrees with plan. Treat potential ID issues including aspiration pneumonia as below. Spoke with daughter re: plan yesterday 2. Antithrombotics: -DVT/anticoagulation:Mechanical:Sequential compression devices, below kneeBilateral lower extremities--platelets only 75k -antiplatelet therapy: N/A 3.Chronic LBP/Pain  Management:Tylenol prn. 4. Mood:LCSW to follow for evaluation and support. -antipsychotic agents: N/A 5. Neuropsych: This patientis notcapable of making decisions on hisown behalf. 6. Skin/Wound Care:Routine pressure relief measures. 7. Fluids/Electrolytes/Nutrition:encourage PO as able  -continue IVF at [facility] for now  -check labs again Friday 8. HTN: currently controlled off medications.SBP goal<140--will add prn catapres 9.H/oCOPD:Stable without meds. 10. Impaired glucose tolerance:CBG's wnl so far.  - SSI as indicated. 11. CAD/AVR:Off ASA due to bleed. No statin due to intolerance. Tenormin, cozaar and lasix (used prn) on hold.  -no TEE indicated 12. Thrombocytopenia: count stable at 76k today  13. AKI: sl elevation today -continue IVF hs especially if he remains lethargic 14. Dysphagia:   dysphagia 1, honey liquids (held for lethargy)--continue  IVF for hydration at nights.             -aspiration precautions  -4/28 pt currently npo, can advance back to diet if more alert today, spoke with SLP 15. Constipation: added Senna at bedtime as no BM since admission.  16. Low grade temp/lethargy  4/28-CXR suspicious for pneumonia, pt at risk for aspiration  -Tm 100 yesterday at 14:30  -ua negative, ucx pending  -continue zosyn at least for today pending temp curve, presentation  LOS: 2 days A FACE TO FACE EVALUATION WAS PERFORMED  Meredith Staggers 09/11/2019, 8:33 AM

## 2019-09-11 NOTE — Progress Notes (Signed)
Bilateral lower extremity venous duplex has been completed. Preliminary results can be found in CV Proc through chart review.   09/11/19 1:02 PM Carlos Levering RVT

## 2019-09-11 NOTE — Progress Notes (Signed)
Physical Therapy Session Note  Patient Details  Name: David [name] MRN: [id number] Date of Birth: 10-08-1923  Today's Date: 09/11/2019 PT Individual Time: 1500-1525 PT Individual Time Calculation (min): 25 min   Short Term Goals: Week 1:  PT Short Term Goal 1 (Week 1): Pt will be able to perform bed mobility with mod assist PT Short Term Goal 2 (Week 1): Pt will be able to initiate transfers with max +2 PT Short Term Goal 3 (Week 1): Pt will be able to reorient to midline seated EOB with mod assist  Skilled Therapeutic Interventions/Progress Updates:    Patient received in bed asleep and lethargic requiring Max VC/TC and wet washcloth on hands/forehead but finally opened eyes and became interactive today but only responded with yes/no answers and really only seemed to do well with simple cues/commands. Required totalAx2 for sit to supine <->supine to sit, then able to sit at EOB for approximately 7-8 minutes with ModA increasing to MaxA due to posterior lean as he fatigued more. Attempted to get him to attend to/increase attention to right side however unable to follow cues for such. Performed R cervical rotation PROM with son standing on the right as incentive/stimulus to interact with objects/people on the right, also encouraged son to interact with him on the right side when he is laying in bed to help promote awareness to R when he is not participating in therapy. Returned to bed with totalAx2 and left in bed positioned to comfort with all needs met this afternoon.   Therapy Documentation Precautions:  Precautions Precautions: Fall Precaution Comments: R hemi; R inattention Restrictions Weight Bearing Restrictions: No Pain: Pain Assessment Pain Scale: Faces Faces Pain Scale: No hurt    Therapy/Group: Individual Therapy   Windell Norfolk, DPT, PN1   Supplemental Physical Therapist [place]    Pager [phone] Acute Rehab Office [phone]    09/11/2019, 3:42 PM

## 2019-09-11 NOTE — Progress Notes (Signed)
Occupational Therapy Session Note  Patient Details  Name: David [name] MRN: [id number] Date of Birth: Aug 21, 1923  Today's Date: 09/11/2019 OT Individual Time: 9450-3888 OT Individual Time Calculation (min): 43 min   Short Term Goals: Week 1:  OT Short Term Goal 1 (Week 1): Pt will perform grooming with min A overall. OT Short Term Goal 2 (Week 1): Pt will maintain sitting balance during self care tasks with max A overall. OT Short Term Goal 3 (Week 1): Pt will perform UB dressing with max A. OT Short Term Goal 4 (Week 1): Pt will transfer with assist of 1 person.  Skilled Therapeutic Interventions/Progress Updates:    Pt greeted semi-reclined in bed with daughter Narda Rutherford present. Pt more awake and alert today. Pt noted to bed incontinent of urine. Worked on rolling in bed with total A +2 and pt able to initiate some with L side of body. Pt needed total A for peri-care and brief change with noted blood from penis which nursing was notified. Total A +2 to don pants in bed with pt able to follow 1 step command to lift LLE slightly, but not enough to get into pants. Pt brought to sitting EOB with total A +2. Pt's sitting balance started with max A, then progressed to min A in static sitting. Sit<>stand in [place] with bed raised and max/total A +2. Pt with increased pushing in STedy to the R requiring total A +2 to maintain balance when transferring over to TIS wc. Max+2 to stand again from perched stedy to then sit in TIS wc. Worked on visual scanning to the R to locate wash cloth at midline. Pt then needed facilitation to initaite washing face, but then he was able to complete face washing task with cues for termination. Pt left seated in TIS wc with R UE supported, alarm belt on, and needs met.   Therapy Documentation Precautions:  Precautions Precautions: Fall Precaution Comments: R hemi; R inattention Restrictions Weight Bearing Restrictions: No Pain: Pain Assessment Pain Scale: 0-10 Pain  Score: 0-No pain   Therapy/Group: Individual Therapy  Valma Cava 09/11/2019, 10:17 AM

## 2019-09-11 NOTE — Progress Notes (Signed)
Occupational Therapy Session Note  Patient Details  Name: David [name] MRN: [id number] Date of Birth: 09-11-1923  Today's Date: 09/11/2019 OT Individual Time: 1130-1200 OT Individual Time Calculation (min): 30 min    Short Term Goals: Week 1:  OT Short Term Goal 1 (Week 1): Pt will perform grooming with min A overall. OT Short Term Goal 2 (Week 1): Pt will maintain sitting balance during self care tasks with max A overall. OT Short Term Goal 3 (Week 1): Pt will perform UB dressing with max A. OT Short Term Goal 4 (Week 1): Pt will transfer with assist of 1 person.  Skilled Therapeutic Interventions/Progress Updates:  Pt received seated in TIS with daughter present reporting pain in back requesting to return to bed. Overall, pt required MAX- total A +2 to stand to stedy. Pt with heavy R lateral lean in stedy needing MOD A to maintain upright posture and MAX tactile facilitation at trunk to elevate trunk to midline. Pt sat EOB ~ 8 mins for RN and PA to enter and assess rash on pts back. Pt required MAX - MIN A for sitting balance unable to self correct R posterior lean without assist. Pt return to supine with MAX A+2 to lower trunk and return BLEs to supine. Pt unable to attend to pts R side during session. Pt present with poor proprioception unable to state where therapist touching pt on R side. Pt unable to state location or daughters name during session. Pt left supine in bed with daughter present and all needs within reach.   Therapy Documentation Precautions:  Precautions Precautions: Fall Precaution Comments: R hemi; R inattention Restrictions Weight Bearing Restrictions: No General:   Vital Signs:  Pain: Pt reports unrated pain in back from sitting up in w/c; assisted pt back to bed as pain mgmt strategy.   Therapy/Group: Individual Therapy  Ihor Gully 09/11/2019, 12:22 PM

## 2019-09-11 NOTE — Progress Notes (Signed)
Patient with excoriated rash on his back. Does have sensitive skin per daughter--will order hypoallergenic sheets and cream 1:1 mix of triamcinolone/eucerin ordered to help with healing. Therapy also reported bloody on penis--on exam dried blood noted on penis with bloody drainage at meatus--was cathed last night with bloody urine per documentation.  Likely has hematuria due to trauma as well as low platelets --noted on UA yesterday also. await UCS to rule out UTI as cause. Has been having tea colored urine since 4/25? Will check string of 5 bottles to rule out worsening and monitor for trend.

## 2019-09-11 NOTE — Evaluation (Signed)
Speech Language Pathology Cognitive-Linguistic Evaluation   Patient Details  Name: David [name] MRN: [id number] Date of Birth: 03-07-1924  SLP Diagnosis: Cognitive Impairments;Dysarthria;Aphasia  Rehab Potential: Good ELOS: 3-4 weeks    Today's Date: 09/11/2019 SLP Individual Time: 2440-1027 SLP Individual Time Calculation (min): 40 min   Problem List:  Patient Active Problem List   Diagnosis Date Noted  . Coronary artery disease involving native coronary artery of native heart without angina pectoris   . Chronic obstructive pulmonary disease ([place])   . Tremors of nervous system   . S/P AVR   . Thrombocytopenia ([place])   . Global aphasia   . ICH (intracerebral hemorrhage) ([place]) 09/04/2019  . Tremor 05/20/2019  . 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 Chest pain, atypical 09/20/2018  . Onychomycosis 06/06/2018  . Acute pain of left knee 08/18/2017  . Wart 06/05/2017  . Acute upper respiratory infection 05/12/2016  . Skin irritation from shaving 12/02/2015  . Benign paroxysmal positional vertigo 03/09/2015  . Plantar fasciitis, bilateral 12/02/2014  . Constipation 07/17/2014  . Cough 04/09/2014  . S/P AVR (aortic valve replacement) 11/04/2013  . Elevated hemoglobin ([place]) 12/14/2012  . Well adult exam 11/12/2012  . 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 Impaired glucose tolerance 02/14/2011  . Edema 10/22/2010  . Actinic keratoses 10/22/2010  . THROMBOCYTOPENIA 01/22/2010  . MELANOMA 03/05/2009  . TOBACCO USE, QUIT 02/27/2009  . Neoplasm of uncertain behavior of skin 05/08/2007  . Dyslipidemia 02/08/2007  . Essential hypertension 02/08/2007  . Coronary atherosclerosis 02/08/2007  . COPD 02/08/2007  . GERD 02/08/2007  . OSTEOARTHRITIS 02/08/2007  . LOW BACK PAIN 02/08/2007   Past Medical History:  Past Medical History:  Diagnosis Date  . CAD (coronary artery disease)    intolerance of statins  . COPD (chronic obstructive pulmonary disease) ([place])   . Current  use of long term anticoagulation   . GERD (gastroesophageal reflux disease)   . Hyperlipidemia   . Hypertension   . Impaired glucose tolerance 02/14/2011  . LBP (low back pain)   . OA (osteoarthritis)   . 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   Past Surgical History:  Past Surgical History:  Procedure Laterality Date  . 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 CARDIAC CATHETERIZATION  10/05/1998   Recommend CABG revascularizaition  . CARDIAC CATHETERIZATION  06/11/1999   Continue medical therapy  . CARDIAC CATHETERIZATION  01/08/2007   Patent grafts, recommend medical therapy  . CARDIOVASCULAR STRESS TEST  04/22/2010   Small lateral defect which is partially reversible, no ischemic EKG changes were noted.  Marland Kitchen CAROTID DOPPLER  04/22/2010   Bilateral ICAs-no evidence of diametere reduction, significant tortuosity, or any other vascular abnormality.  . CORONARY ARTERY BYPASS GRAFT  10/09/1998   LIMA to LAD, SVG to diag, SVG sequential to 2 marginals, SVG sequential to PDA & PLA. CE#25 porcine AVR.  Marland Kitchen TRANSTHORACIC ECHOCARDIOGRAM  05/14/2012   EF 50-55%, bioprosthesis of the aortic valve with well preserved LV function, moderate concentric [place]    Assessment / Plan / Recommendation Clinical Impression Patient demonstrates a moderate aphasia characterized by decreased basic yes/no accuracy and decreased ability to follow 2-step commands. Patient's verbal expression is characterized by decreased ability to complete automatic sequences, decreased confrontational naming and decreased responsive naming. Patient attempted to answer all questions at the phrase and sentence level, however, intelligibility was impacted by mild-moderate dysarthria.  Deficits in orientation, attention, awareness, recall and problem solving were also noted.  Patient would benefit from skilled SLP intervention to maximize his cognitive-linguistic functioning and overall functional independence prior to  discharge.    Skilled Therapeutic Interventions          Administered a cognitive-linguistic evaluation, please see above for details. Educated patient's daughter regarding aphasia, cognitive impairments and dysarthria and their impact on his function. She verbalized understanding and agreement.   SLP Assessment  Patient will need skilled [facility] during CIR admission    Recommendations  Oral Care Recommendations: Oral care BID Patient destination: Home Follow up Recommendations: Home Health SLP;24 hour supervision/assistance Equipment Recommended: To be determined    SLP Frequency 3 to 5 out of 7 days   SLP Duration  SLP Intensity  SLP Treatment/Interventions 3-4 weeks  Minumum of 1-2 x/day, 30 to 90 minutes  Cognitive remediation/compensation;Internal/external aids;Speech/Language facilitation;Environmental Environmental consultant;Therapeutic Activities;Patient/family education;Functional tasks    Pain No/Denies Pain  SLP Evaluation Cognition Overall Cognitive Status: Impaired/Different from baseline Arousal/Alertness: Lethargic Orientation Level: Oriented to person;Disoriented to situation;Disoriented to time;Disoriented to person Attention: Sustained Focused Attention: Appears intact Sustained Attention: Impaired Sustained Attention Impairment: Verbal basic;Functional basic Memory: Impaired Memory Impairment: Decreased short term memory;Decreased recall of new information Decreased Short Term Memory: Verbal basic;Functional basic Awareness: Impaired Awareness Impairment: Intellectual impairment Problem Solving: Impaired Problem Solving Impairment: Verbal basic;Functional basic Safety/Judgment: Impaired  Comprehension Auditory Comprehension Overall Auditory Comprehension: Impaired Yes/No Questions: Impaired Basic Biographical Questions: 76-100% accurate Basic Immediate Environment Questions: 75-100% accurate Commands: Impaired One  Step Basic Commands: 75-100% accurate Two Step Basic Commands: 0-24% accurate Conversation: Simple Interfering Components: Attention;Processing speed;Motor planning EffectiveTechniques: Visual/Gestural cues;Extra processing time Visual Recognition/Discrimination Discrimination: Not tested Reading Comprehension Reading Status: Not tested Expression Expression Primary Mode of Expression: Verbal Verbal Expression Overall Verbal Expression: Impaired Initiation: Impaired Automatic Speech: Name;Social Response Level of Generative/Spontaneous Verbalization: Word Repetition: Impaired Level of Impairment: Word level Naming: Impairment Responsive: 51-75% accurate Confrontation: Impaired Pragmatics: Impairment Impairments: Abnormal affect;Eye contact;Monotone Interfering Components: Speech intelligibility Written Expression Dominant Hand: Right Written Expression: Not tested Oral Motor Oral Motor/Sensory Function Overall Oral Motor/Sensory Function: Moderate impairment Facial Symmetry: Abnormal symmetry right;Suspected CN VII (facial) dysfunction Motor Speech Overall Motor Speech: Impaired Respiration: Within functional limits Phonation: Normal Resonance: Within functional limits Articulation: Impaired Level of Impairment: Phrase Intelligibility: Intelligibility reduced Word: 75-100% accurate Phrase: 50-74% accurate Effective Techniques: Over-articulate;Slow rate   Short Term Goals: Week 1: SLP Short Term Goal 1 (Week 1): Patient will utilize speech intelligibility strategies at the phrase level to achieve ~50% intelligibility with Max verbal cues. SLP Short Term Goal 2 (Week 1): Patient will verbalize wants/needs at the phrase level with Max verbal cues. SLP Short Term Goal 3 (Week 1): Patient will follow 2 step commands with 50% accuracy and Max verbal and visual cues. SLP Short Term Goal 4 (Week 1): Patient will demonstrate sustained attention to functional tasks for 5  minutes with Mod verbal cues for redirection. SLP Short Term Goal 5 (Week 1): Patient will demonstrate orientation to place, time and situation with Max A multimodal cues.  Refer to Care Plan for Long Term Goals  Recommendations for other services: Neuropsych  Discharge Criteria: Patient will be discharged from SLP if patient refuses treatment 3 consecutive times without medical reason, if treatment goals not met, if there is a change in medical status, if patient makes no progress towards goals or if patient is discharged from hospital.  The above assessment, treatment plan, treatment alternatives and goals were discussed and mutually agreed upon: by patient  and by family  Willamina Grieshop 09/11/2019, 1:59 PM

## 2019-09-12 ENCOUNTER — Inpatient Hospital Stay (HOSPITAL_COMMUNITY): Payer: Medicare Other | Admitting: Speech Pathology

## 2019-09-12 ENCOUNTER — Encounter (HOSPITAL_COMMUNITY): Payer: Medicare Other | Admitting: Speech Pathology

## 2019-09-12 ENCOUNTER — Inpatient Hospital Stay (HOSPITAL_COMMUNITY): Payer: Medicare Other

## 2019-09-12 LAB — CBC
HCT: 43.7 % (ref 39.0–52.0)
Hemoglobin: 15 g/dL (ref 13.0–17.0)
MCH: 30.4 pg (ref 26.0–34.0)
MCHC: 34.3 g/dL (ref 30.0–36.0)
MCV: 88.5 fL (ref 80.0–100.0)
Platelets: 90 10*3/uL — ABNORMAL LOW (ref 150–400)
RBC: 4.94 MIL/uL (ref 4.22–5.81)
RDW: 13.2 % (ref 11.5–15.5)
WBC: 7.9 10*3/uL (ref 4.0–10.5)
nRBC: 0 % (ref 0.0–0.2)

## 2019-09-12 LAB — BASIC METABOLIC PANEL
Anion gap: 8 (ref 5–15)
BUN: 18 mg/dL (ref 8–23)
CO2: 22 mmol/L (ref 22–32)
Calcium: 8.2 mg/dL — ABNORMAL LOW (ref 8.9–10.3)
Chloride: 107 mmol/L (ref 98–111)
Creatinine, Ser: 1.28 mg/dL — ABNORMAL HIGH (ref 0.61–1.24)
GFR calc Af Amer: 55 mL/min — ABNORMAL LOW (ref >60)
GFR calc non Af Amer: 48 mL/min — ABNORMAL LOW (ref >60)
Glucose, Bld: 85 mg/dL (ref 70–99)
Potassium: 3.7 mmol/L (ref 3.5–5.1)
Sodium: 137 mmol/L (ref 135–145)

## 2019-09-12 LAB — GLUCOSE, CAPILLARY
Glucose-Capillary: 78 mg/dL (ref 70–99)
Glucose-Capillary: 112 mg/dL — ABNORMAL HIGH (ref 70–99)
Glucose-Capillary: 105 mg/dL — ABNORMAL HIGH (ref 70–99)
Glucose-Capillary: 100 mg/dL — ABNORMAL HIGH (ref 70–99)

## 2019-09-12 NOTE — Plan of Care (Signed)
  Problem: Consults Goal: RH GENERAL PATIENT EDUCATION Description: See Patient Education module for education specifics. Outcome: Progressing Goal: Skin Care Protocol Initiated - if Braden Score 18 or less Description: Patients skin will be free from break down Outcome: Progressing Goal: Nutrition Consult-if indicated Outcome: Progressing Goal: Diabetes Guidelines if Diabetic/Glucose > 140 Description: If diabetic or lab glucose is > 140 mg/dl - Initiate Diabetes/Hyperglycemia Guidelines & Document Interventions  Outcome: Progressing   Problem: RH BOWEL ELIMINATION Goal: RH STG MANAGE BOWEL WITH ASSISTANCE Description: STG Manage Bowel with mod I Assistance. Outcome: Progressing Goal: RH STG MANAGE BOWEL W/MEDICATION W/ASSISTANCE Description: STG Manage Bowel with Medication with mod I Assistance. Outcome: Progressing   Problem: RH BLADDER ELIMINATION Goal: RH STG MANAGE BLADDER WITH ASSISTANCE Description: STG Manage Bladder With mod I Assistance Outcome: Progressing   Problem: RH SKIN INTEGRITY Goal: RH STG SKIN FREE OF INFECTION/BREAKDOWN Description: Patient will be free from skin break down Outcome: Progressing Goal: RH STG MAINTAIN SKIN INTEGRITY WITH ASSISTANCE Description: STG Maintain Skin Integrity With mod I Assistance. Outcome: Progressing Goal: RH STG ABLE TO PERFORM INCISION/WOUND CARE W/ASSISTANCE Description: STG Able To Perform Incision/Wound Care With mod I Assistance. Outcome: Progressing   Problem: RH SAFETY Goal: RH STG ADHERE TO SAFETY PRECAUTIONS W/ASSISTANCE/DEVICE Description: STG Adhere to Safety Precautions With Assistance/Device. Outcome: Progressing   Problem: RH PAIN MANAGEMENT Goal: RH STG PAIN MANAGED AT OR BELOW PT'S PAIN GOAL Description: Pain scale <3/10 Outcome: Progressing   Problem: RH KNOWLEDGE DEFICIT GENERAL Goal: RH STG INCREASE KNOWLEDGE OF SELF CARE AFTER HOSPITALIZATION Outcome: Progressing   Problem: Consults Goal: RH  STROKE PATIENT EDUCATION Description: See Patient Education module for education specifics  Outcome: Progressing   Problem: RH KNOWLEDGE DEFICIT Goal: RH STG INCREASE KNOWLEDGE OF HYPERTENSION Description: Patient will be able to verbalize anti-hypertensive medication & side effects by discharge Outcome: Progressing Goal: RH STG INCREASE KNOWLEDGE OF DYSPHAGIA/FLUID INTAKE Description: Patient will be able to verbalize need for specialty diet Outcome: Progressing Goal: RH STG INCREASE KNOWLEDGE OF STROKE PROPHYLAXIS Description: Patient will be able to verbalize medications that are for stroke prophylaxis Outcome: Progressing   Problem: Consults Goal: RH STROKE PATIENT EDUCATION Description: See Patient Education module for education specifics  Outcome: Progressing

## 2019-09-12 NOTE — Progress Notes (Signed)
Modified Barium Swallow Progress Note  Patient Details  Name: NIKOLOS CORKILL MRN: [id number] Date of Birth: [birth year]  Today's Date: 09/12/2019  Modified Barium Swallow completed.  Full report located under Chart Review in the Imaging Section.  Brief recommendations include the following:  Clinical Impression  Patient presents with a moderate oropharyngeal dysphagia characterized by premature loss of bolus into the pharynx with delayed and inconsistent onset of pharyngeal swallow.  Patient demonstrated difficulty with self-feeding (question motor planning issues) and following commands requiring Max verbal and tactile cues for use of smaller sips. Patient would hold the cup up to his mouth with neck mildly hyperextended allowing liquids to fill the pharynx prior to swallow initiation.  This led to penetration with nectar-thick liquids via cup and silent aspiration of nectar-thick liquids via straw.  Recommend patient reinitiate conservative diet of Dys. 1 textures with honey-thick liquids to minimize aspiration risk due to patient's impaired and inconsistent cognitive-linguistic abilities, motor planning deficits and decreased mobility.  Educated daughter regarding results, she verbalized understanding.   Swallow Evaluation Recommendations       SLP Diet Recommendations: Dysphagia 1 (Puree) solids;Honey thick liquids   Liquid Administration via: Cup;Spoon   Medication Administration: Crushed with puree   Supervision: Staff to assist with self feeding;Patient able to self feed;Full supervision/cueing for compensatory strategies   Compensations: Minimize environmental distractions;Slow rate;Small sips/bites   Postural Changes: Seated upright at 90 degrees   Oral Care Recommendations: Oral care BID   Other Recommendations: Order thickener from pharmacy;Prohibited food (jello, ice cream, thin soups);Clarify dietary restrictions;Remove water pitcher;Have oral suction  available    Kaislyn Gulas 09/12/2019,9:42 AM

## 2019-09-12 NOTE — Progress Notes (Signed)
Speech Language Pathology Daily Session Note  Patient Details  Name: BRECKEN BARKUS MRN: [id number] Date of Birth: [birth year]  Today's Date: 09/12/2019 SLP Individual Time: 1130-1200 SLP Individual Time Calculation (min): 30 min  Short Term Goals: Week 1: SLP Short Term Goal 1 (Week 1): Patient will utilize speech intelligibility strategies at the phrase level to achieve ~50% intelligibility with Max verbal cues. SLP Short Term Goal 2 (Week 1): Patient will verbalize wants/needs at the phrase level with Max verbal cues. SLP Short Term Goal 3 (Week 1): Patient will follow 2 step commands with 50% accuracy and Max verbal and visual cues. SLP Short Term Goal 4 (Week 1): Patient will demonstrate sustained attention to functional tasks for 5 minutes with Mod verbal cues for redirection. SLP Short Term Goal 5 (Week 1): Patient will demonstrate orientation to place, time and situation with Max A multimodal cues. SLP Short Term Goal 6 (Week 1): Patient will conusme current diet with minimal overt s/s of aspiration with Mod verbal cues for use of swallowing compensatory strategies.  Skilled Therapeutic Interventions: Skilled treatment session focused on communication and dysphagia goals. Upon arrival, patient was uncomfortable in the wheelchair and was able to verbalize what was causing him discomfort at the phrase and sentence level with extra time. Patient also communicated at the phrase level throughout the meal with ~75% intelligibility. Patient consumed his lunch meal of Dys. 1 textures with honey-thick liquids without overt s/s of aspiration and Min-Mod A verbal cues for use of swallowing compensatory strategies. Recommend patient continue current diet. Patient left upright in the wheelchair with alarm on and daughter present. Continue with current plan of care.      Pain No/Denies Pain   Therapy/Group: Individual Therapy  Ivon Roedel 09/12/2019, 12:42 PM

## 2019-09-12 NOTE — Progress Notes (Signed)
[place] PHYSICAL MEDICINE & REHABILITATION PROGRESS NOTE   Subjective/Complaints: Pt up in bed. Wants to get up and get stronger. Appears to have slept. Blood reported around penis and urine color is still red to amber.  ROS: Limited due to cognitive/behavioral     Objective:   DG Chest 2 View  Result Date: 09/10/2019 CLINICAL DATA:  Fever EXAM: CHEST - 2 VIEW COMPARISON:  09/04/2019 FINDINGS: Prior CABG and aortic valve replacement. The heart size and mediastinal contours are stable. Low lung volumes. Increased interstitial markings within the left lung base, slightly progressed from prior. No pneumothorax. IMPRESSION: Increased interstitial markings within the left lung base, slightly progressed from prior. Findings could represent atelectasis versus pneumonia. Electronically Signed   By: Davina Poke D.O.   On: 09/10/2019 16:33   DG Abd 1 View  Result Date: 09/10/2019 CLINICAL DATA:  Abdominal distension. EXAM: ABDOMEN - 1 VIEW COMPARISON:  None. FINDINGS: The bowel gas pattern is normal. Residual contrast is noted in the colon. No radio-opaque calculi or other significant radiographic abnormality are seen. IMPRESSION: No evidence of bowel obstruction or ileus. Electronically Signed   By: Marijo Conception M.D.   On: 09/10/2019 16:32   CT HEAD WO CONTRAST  Result Date: 09/10/2019 CLINICAL DATA:  Follow-up left basal ganglia hemorrhage. Increased lethargy. EXAM: CT HEAD WITHOUT CONTRAST TECHNIQUE: Contiguous axial images were obtained from the base of the skull through the vertex without intravenous contrast. COMPARISON:  09/04/2019 FINDINGS: Brain: Slight enlargement of the left basal ganglia intraparenchymal hemorrhage, measuring 4.4 x 2.5 x 3.6 cm (volume = 21 cm^3) today compared with 16 cc volume 6 days ago. Additionally, there is intraventricular penetration, having breach the atrium of the left lateral ventricle, with a small amount of blood dependent in both occipital horns.  Ventricular size remains stable however without hydrocephalus. Surrounding edema is slightly more pronounced. One could question if there has been extension of infarction more anteriorly within the basal ganglia and external capsule or if this relates to edema from the hemorrhage. Other small-vessel ischemic changes throughout brain appear stable. No new large vessel infarction. No extra-axial collection. Vascular: There is atherosclerotic calcification of the major vessels at the base of the brain. Skull: Negative Sinuses/Orbits: Clear/normal Other: None IMPRESSION: Slight enlargement of the left basal ganglia intraparenchymal hemorrhage when compared to the study of 6 days ago. Previous volume was calculated at 16 cc. Today calculated at 21 cc. There is also intraventricular penetration with a small amount of blood dependent in the occipital horns of both lateral ventricles but no hydrocephalus. Slightly more surrounding edema as expected. Some edema extends anterior into the basal ganglia and external capsule region, which could either be extension of edema or slight extension of the infarction itself. These results will be called to the ordering clinician or representative by the Radiologist Assistant, and communication documented in the PACS or [employer]. Electronically Signed   By: Nelson Chimes M.D.   On: 09/10/2019 16:57   VAS [place] LOWER EXTREMITY VENOUS (DVT)  Result Date: 09/11/2019  Lower Venous DVTStudy Indications: Immobility.  Risk Factors: None identified. Comparison Study: No prior studies. Performing Technologist: Oliver Hum RVT  Examination Guidelines: A complete evaluation includes B-mode imaging, spectral Doppler, color Doppler, and power Doppler as needed of all accessible portions of each vessel. Bilateral testing is considered an integral part of a complete examination. Limited examinations for reoccurring indications may be performed as noted. The reflux portion of the exam is  performed with  the patient in reverse Trendelenburg.  +---------+---------------+---------+-----------+----------+--------------+ RIGHT    CompressibilityPhasicitySpontaneityPropertiesThrombus Aging +---------+---------------+---------+-----------+----------+--------------+ CFV      Full           Yes      Yes                                 +---------+---------------+---------+-----------+----------+--------------+ SFJ      Full                                                        +---------+---------------+---------+-----------+----------+--------------+ FV Prox  Full                                                        +---------+---------------+---------+-----------+----------+--------------+ FV Mid   Full                                                        +---------+---------------+---------+-----------+----------+--------------+ FV DistalFull                                                        +---------+---------------+---------+-----------+----------+--------------+ PFV      Full                                                        +---------+---------------+---------+-----------+----------+--------------+ POP      Full           Yes      Yes                                 +---------+---------------+---------+-----------+----------+--------------+ PTV      Full                                                        +---------+---------------+---------+-----------+----------+--------------+ PERO     Full                                                        +---------+---------------+---------+-----------+----------+--------------+   +---------+---------------+---------+-----------+----------+--------------+ LEFT     CompressibilityPhasicitySpontaneityPropertiesThrombus Aging +---------+---------------+---------+-----------+----------+--------------+ CFV      Full           Yes      Yes                                  +---------+---------------+---------+-----------+----------+--------------+  SFJ      Full                                                        +---------+---------------+---------+-----------+----------+--------------+ FV Prox  Full                                                        +---------+---------------+---------+-----------+----------+--------------+ FV Mid   Full                                                        +---------+---------------+---------+-----------+----------+--------------+ FV DistalFull                                                        +---------+---------------+---------+-----------+----------+--------------+ PFV      Full                                                        +---------+---------------+---------+-----------+----------+--------------+ POP      Full           Yes      Yes                                 +---------+---------------+---------+-----------+----------+--------------+ PTV      Full                                                        +---------+---------------+---------+-----------+----------+--------------+ PERO     Full                                                        +---------+---------------+---------+-----------+----------+--------------+     Summary: RIGHT: - There is no evidence of deep vein thrombosis in the lower extremity.  - No cystic structure found in the popliteal fossa.  LEFT: - There is no evidence of deep vein thrombosis in the lower extremity.  - No cystic structure found in the popliteal fossa.  *See table(s) above for measurements and observations. Electronically signed by Servando Snare MD on 09/11/2019 at 8:21:21 PM.    Final    Recent Labs    09/11/19 0729 09/12/19 0511  WBC 9.8 7.9  HGB 15.5 15.0  HCT 46.0 43.7  PLT 79* 90*   Recent Labs    09/11/19  [id number] 09/12/19 0511  NA 135 137  K 4.0 3.7  CL 106 107  CO2 17* 22  GLUCOSE 95 85  BUN 17 18   CREATININE 1.31* 1.28*  CALCIUM 8.4* 8.2*    Intake/Output Summary (Last 24 hours) at 09/12/2019 0918 Last data filed at 09/12/2019 0800 Gross per 24 hour  Intake 0 ml  Output --  Net 0 ml     Physical Exam: Vital Signs Blood pressure (!) 146/78, pulse 62, temperature 97.6 F (36.4 C), resp. rate 18, weight 112.3 kg, SpO2 98 %.  Constitutional: No distress . Vital signs reviewed. HEENT: EOMI, oral membranes moist Neck: supple Cardiovascular: RRR without murmur. No JVD    Respiratory/Chest: CTA Bilaterally without wheezes or rales. Normal effort    GI/Abdomen: BS +, non-tender, non-distended Ext: no clubbing, cyanosis, or edema Psych: much more alert. cooperative Musculoskeletal:  General: No deformityor edema.  Cervical back: Normal range of motion.  Uro: irritation around urethral meatus Neurological: more alert. Answered a few y/n questions appropriately. Speech very garbled, aphasic.  Minimal active movement RUE/RLE. Moves LUE and LLE more automatically. Senses pain in all 4's. Skin: Skin iswarm.abrasion right forehead      Assessment/Plan: 1. Functional deficits secondary to left BG ICH which require 3+ hours per day of interdisciplinary therapy in a comprehensive inpatient rehab setting.  Physiatrist is providing close team supervision and 24 hour management of active medical problems listed below.  Physiatrist and rehab team continue to assess barriers to discharge/monitor patient progress toward functional and medical goals  Care Tool:  Bathing        Body parts bathed by helper: Right arm, Left arm, Chest, Abdomen, Front perineal area, Buttocks, Right upper leg, Left upper leg, Right lower leg, Left lower leg, Face     Bathing assist Assist Level: 2 Helpers     Upper Body Dressing/Undressing Upper body dressing Upper body dressing/undressing activity did not occur (including orthotics): N/A What is the patient wearing?: Hospital gown only     Upper body assist Assist Level: Dependent - Patient 0%    Lower Body Dressing/Undressing Lower body dressing      What is the patient wearing?: Incontinence brief     Lower body assist Assist for lower body dressing: Dependent - Patient 0%     Toileting Toileting    Toileting assist Assist for toileting: 2 Helpers     Transfers Chair/bed transfer  Transfers assist  Chair/bed transfer activity did not occur: Safety/medical concerns  Chair/bed transfer assist level: Dependent - Patient 0%(stedy)     Locomotion Ambulation   Ambulation assist   Ambulation activity did not occur: Safety/medical concerns          Walk 10 feet activity   Assist  Walk 10 feet activity did not occur: Safety/medical concerns        Walk 50 feet activity   Assist Walk 50 feet with 2 turns activity did not occur: Safety/medical concerns         Walk 150 feet activity   Assist Walk 150 feet activity did not occur: Safety/medical concerns         Walk 10 feet on uneven surface  activity   Assist Walk 10 feet on uneven surfaces activity did not occur: Safety/medical concerns         Wheelchair     Assist Will patient use wheelchair at discharge?: Yes Type of Wheelchair: Manual Wheelchair activity did not occur: Safety/medical concerns(lethargy; TIS would be dependent)  Wheelchair 50 feet with 2 turns activity    Assist            Wheelchair 150 feet activity     Assist          Blood pressure (!) 146/78, pulse 62, temperature 97.6 F (36.4 C), resp. rate 18, weight 112.3 kg, SpO2 98 %.  Medical Problem List and Plan: 1.Functional, mobility and cognitive deficitssecondary to left basal ganglia hemorrhage -patient may shower  -may participate in therapies as tolerated -ELOS/Goals: 22-28 days/min assist with PT, OT, SLP  -4/29 much more alert today. See ID plan below   -consider f/u HCT next  week depending upon progress 2. Antithrombotics: -DVT/anticoagulation:Mechanical:Sequential compression devices, below kneeBilateral lower extremities--platelets stable to increased 90k -antiplatelet therapy: N/A 3.Chronic LBP/Pain Management:Tylenol prn. 4. Mood:LCSW to follow for evaluation and support. -antipsychotic agents: N/A 5. Neuropsych: This patientis notcapable of making decisions on hisown behalf. 6. Skin/Wound Care:Routine pressure relief measures. 7. Fluids/Electrolytes/Nutrition:encourage PO as able  -continue IVF at [facility] for now  -4/29 labs holding today, continue for now 8. HTN: currently controlled off medications.SBP goal<140--will add prn catapres 9.H/oCOPD:Stable without meds. 10. Impaired glucose tolerance:CBG's wnl so far.  - SSI as indicated. 11. CAD/AVR:Off ASA due to bleed. No statin due to intolerance. Tenormin, cozaar and lasix (used prn) on hold.  -no TEE indicated 12. Thrombocytopenia: count stable at 90k today  13. AKI: Cr stable at 1.28 4/29 -continue IVF hs especially if he remains lethargic 14. Dysphagia:   dysphagia 1, honey liquids (held for lethargy)--continue  IVF for hydration at nights.             -aspiration precautions  -4/29 pt remains npo, MBS today per SLP. Will decide on plan for nutrition depending upon study. Spoke with daughter  80. Constipation: added Senna at bedtime as no BM since admission.  16. Low grade temp/lethargy  4/28-CXR suspicious for pneumonia, pt at risk for aspiration  4/29 now afebriile  -ua negative, ucx negative  -day 3 zosyn   -pt appears much improved clinically as well  -f/u xray in AM, if stable/improved will change to po abx  LOS: 3 days A FACE TO FACE EVALUATION WAS PERFORMED  Meredith Staggers 09/12/2019, 9:18 AM

## 2019-09-12 NOTE — Progress Notes (Signed)
Occupational Therapy Session Note  Patient Details  Name: David [name] MRN: [id number] Date of Birth: 02/23/1924  Today's Date: 09/12/2019 OT Individual Time: 1345-1430 OT Individual Time Calculation (min): 45 min    Short Term Goals: Week 1:  OT Short Term Goal 1 (Week 1): Pt will perform grooming with min A overall. OT Short Term Goal 2 (Week 1): Pt will maintain sitting balance during self care tasks with max A overall. OT Short Term Goal 3 (Week 1): Pt will perform UB dressing with max A. OT Short Term Goal 4 (Week 1): Pt will transfer with assist of 1 person.  Skilled Therapeutic Interventions/Progress Updates:     1;1. Pt received in w/c with daughter present requesting work on pt arm. Pt completes towel glides at table top with total A for NMR and pt able to maintain visual fixation on RUE with max VC and in beginning max facilitation to scan R to locate R arm in far R visual field. Pt completes squat pivot transfer back to bed with MAX A +2 and sit>supine with +2 A. OT educates pt and daughter on estim and applies saebo to R deltoid for 60 min unattended with good contraction noted. Skin in tact upon return Saebo Stim One 330 pulse width 35 Hz pulse rate On 8 sec/ off 8 sec Ramp up/ down 2 sec Symmetrical Biphasic wave form  Max intensity 161mA at 500 Ohm load    Therapy Documentation Precautions:  Precautions Precautions: Fall Precaution Comments: R hemi; R inattention Restrictions Weight Bearing Restrictions: No General:   Vital Signs: Therapy Vitals Temp: 98.2 F (36.8 C) Pulse Rate: (!) 59 Resp: 18 BP: 126/72 Patient Position (if appropriate): Sitting Oxygen Therapy SpO2: 97 % O2 Device: Room Air Pain:   ADL:   Vision   Perception    Praxis   Exercises:   Other Treatments:     Therapy/Group: Individual Therapy  Tonny Branch 09/12/2019, 2:31 PM

## 2019-09-12 NOTE — Progress Notes (Signed)
Physical Therapy Session Note  Patient Details  Name: David [name] MRN: [id number] Date of Birth: Jul 05, 1923  Today's Date: 09/12/2019 PT Individual Time: [id number] PT Individual Time Calculation (min): 32 min   Short Term Goals: Week 1:  PT Short Term Goal 1 (Week 1): Pt will be able to perform bed mobility with mod assist PT Short Term Goal 2 (Week 1): Pt will be able to initiate transfers with max +2 PT Short Term Goal 3 (Week 1): Pt will be able to reorient to midline seated EOB with mod assist  Skilled Therapeutic Interventions/Progress Updates:     Patient in bed with his daughter present upon PT arrival. Patient alert and agreeable to PT session. Patient denied pain during session. Patient unable to recall his name this date, PT provided re-orientation x4. Encouraged patient's daughter to re-orient patient throughout the day when he appears confused. Patient able to answer some questions with simple one word answers appropriately, other times he either didn't answer or had an incorrect or of topic answer.    Patient demonstrated that he could lift his L LE in supine independently today, also noted extensor tone in R LE in supine. Patient with significant L neglect throughout session.  Therapeutic Activity: Bed Mobility: Patient performed rolling R/L with mod-max A to don pants with total A. He performed supine to sit with max A +2. Provided verbal cues for brining opposite LE up to push, looking to the side he is rolling, and bringing R UE over his body with his L UE (required hand-over-hand assist). Transfers: Patient performed a squat pivot transfer bed>TIS to the L w/c with max A +2. Provided verbal cues for head-hips relationship, timing, and use of L UE to pull himself over.  Neuromuscular Re-ed: Patient performed the following sitting balance activities: -static sitting EOB progressing from max-mod A to supervision, placed B UEs in his lap with hand-over-hand assist x2 to  reduce pushing R -took 4 sips of honey thick apple juice in sitting using L hand with min A-supervision for trunk control, provided cues for small sips, dry swallow between sips, and clearing his throat after -patient unable to look at therapist on his R, he was able to follow therapist's moving finger from inferior midline to central midline then slightly R to look at therapist, sustained for 15-20 seconds before returning to central gaze  Patient in TIS w/c seated on the L side of the room with his daughter on his R at end of session with breaks locked, seat belt alarm set, and all needs within reach. Educated patient's daughter on sitting on his R and having the patient turn to look R when able. Patients daughter was very attentive to the patient throughout the session. Reports that he is more alert in the mornings than the afternoons.   Therapy Documentation Precautions:  Precautions Precautions: Fall Precaution Comments: R hemi; R inattention Restrictions Weight Bearing Restrictions: No    Therapy/Group: Individual Therapy  Korayma Hagwood L Axcel Horsch PT, DPT  09/12/2019, 1550

## 2019-09-12 NOTE — IPOC Note (Signed)
Overall Plan of Care [facility]) Patient Details Name: David [name] MRN: [id number] DOB: 1924-02-27  Admitting Diagnosis: ICH (intracerebral hemorrhage) [facility])  Hospital Problems: Principal Problem:   ICH (intracerebral hemorrhage) ([place])     Functional Problem List: Nursing Bladder, Bowel, Medication Management, Nutrition, Safety, Skin Integrity  PT Balance, Edema, Endurance, Motor, Nutrition, Pain, Perception, Safety, Sensory, Skin Integrity  OT Balance, Cognition, Endurance, Motor, Safety, Vision  SLP Cognition, Linguistic  TR         Basic ADL's: OT Eating, Grooming, Bathing, Dressing, Toileting     Advanced  ADL's: OT       Transfers: PT Bed Mobility, Bed to Chair, Car, Manufacturing systems engineer, Metallurgist: PT Emergency planning/management officer, Ambulation, Stairs     Additional Impairments: OT Fuctional Use of Upper Extremity  SLP Communication, Social Cognition comprehension, expression Social Interaction, Problem Solving, Memory, Attention, Awareness  TR      Anticipated Outcomes Item Anticipated Outcome  Self Feeding S  Swallowing  min A   Basic self-care  S - mod A  Toileting  mod A   Bathroom Transfers min A  Bowel/Bladder  patient will be able to have elimination needs met with mod I assistance  Transfers  min to mod assist overall  Locomotion  supervision w/c mobility; mod assist gait  Communication  ModA  Cognition  ModA  Pain  patient's pain will be managed within a acceptable level  Safety/Judgment  patient will have no falls with injury while on Ip Rehab   Therapy Plan: PT Intensity: Minimum of 1-2 x/day ,45 to 90 minutes PT Frequency: 5 out of 7 days PT Duration Estimated Length of Stay: 21-28 days OT Intensity: Minimum of 1-2 x/day, 45 to 90 minutes OT Frequency: Total of 15 hours over 7 days of combined therapies OT Duration/Estimated Length of Stay: 3.5- 4 weeks SLP Intensity: Minumum of 1-2 x/day, 30 to 90 minutes SLP Frequency: 3  to 5 out of 7 days SLP Duration/Estimated Length of Stay: 3-4 weeks   Due to the current state of emergency, patients may not be receiving their 3-hours of Medicare-mandated therapy.   Team Interventions: Nursing Interventions Patient/Family Education, Medication Management, Bladder Management, Bowel Management, Skin Care/Wound Management, Disease Management/Prevention, Discharge Planning, Dysphagia/Aspiration Precaution Training  PT interventions Ambulation/gait training, Training and development officer, Cognitive remediation/compensation, Community reintegration, Discharge planning, Disease management/prevention, DME/adaptive equipment instruction, Functional mobility training, Neuromuscular re-education, Pain management, Patient/family education, Psychosocial support, Functional electrical stimulation, Skin care/wound management, Splinting/orthotics, Stair training, Therapeutic Activities, UE/LE Coordination activities, UE/LE Strength taining/ROM, Therapeutic Exercise, Visual/perceptual remediation/compensation, Wheelchair propulsion/positioning  OT Interventions Training and development officer, DME/adaptive equipment instruction, Patient/family education, Therapeutic Activities, Wheelchair propulsion/positioning, Cognitive remediation/compensation, Functional electrical stimulation, Psychosocial support, Therapeutic Exercise, Community reintegration, Functional mobility training, Self Care/advanced ADL retraining, UE/LE Strength taining/ROM, Discharge planning, Neuromuscular re-education, UE/LE Coordination activities, Pain management  SLP Interventions Cognitive remediation/compensation, Internal/external aids, Speech/Language facilitation, Environmental controls, Cueing hierarchy, Therapeutic Activities, Patient/family education, Functional tasks  TR Interventions    SW/CM Interventions Discharge Planning, Psychosocial Support, Patient/Family Education   Barriers to Discharge MD  Medical stability   Nursing Incontinence    PT Incontinence, Decreased caregiver support daughter is available 24/7; can't do more than min physical assist (potential barrier if pt doesn't make progress)  OT Medical stability lethargy  SLP      SW Medical stability     Team Discharge Planning: Destination: PT-Home ,OT- Home , SLP-Home Projected Follow-up: PT-Home health PT, 24 hour supervision/assistance, OT-  Home health  OT, 24 hour supervision/assistance, SLP-Home Health SLP, 24 hour supervision/assistance Projected Equipment Needs: PT-To be determined, OT- To be determined, SLP-To be determined Equipment Details: PT-has power scooter from deceased wife; may have some equipment from his disceased wife. daughter thinks potentially a walker, cane, and w/c, OT-  Patient/family involved in discharge planning: PT- Family member/caregiver, Patient unable/family or caregiver not available,  OT-Family Midwife, Passenger transport manager, Patient  MD ELOS: 3-4 weeks Medical Rehab Prognosis:  Excellent Assessment: The patient has been admitted for CIR therapies with the diagnosis of left bg ich. The team will be addressing functional mobility, strength, stamina, balance, safety, adaptive techniques and equipment, self-care, bowel and bladder mgt, patient and caregiver education, cognition, communication, swallowing. Goals have been set at min to mod assist.   Due to the current state of emergency, patients may not be receiving their 3 hours per day of Medicare-mandated therapy.    Meredith Staggers, MD, FAAPMR      See Team Conference Notes for weekly updates to the plan of care

## 2019-09-13 ENCOUNTER — Inpatient Hospital Stay (HOSPITAL_COMMUNITY): Payer: Medicare Other | Admitting: Occupational Therapy

## 2019-09-13 ENCOUNTER — Inpatient Hospital Stay (HOSPITAL_COMMUNITY): Payer: Medicare Other | Admitting: Speech Pathology

## 2019-09-13 ENCOUNTER — Inpatient Hospital Stay (HOSPITAL_COMMUNITY): Payer: Medicare Other

## 2019-09-13 LAB — GLUCOSE, CAPILLARY
Glucose-Capillary: 94 mg/dL (ref 70–99)
Glucose-Capillary: 115 mg/dL — ABNORMAL HIGH (ref 70–99)
Glucose-Capillary: 114 mg/dL — ABNORMAL HIGH (ref 70–99)
Glucose-Capillary: 130 mg/dL — ABNORMAL HIGH (ref 70–99)

## 2019-09-13 NOTE — Progress Notes (Signed)
Physical Therapy Session Note  Patient Details  Name: David [name] MRN: [id number] Date of Birth: 1923-08-27  Today's Date: 09/13/2019 PT Individual Time: 0830-0930 PT Individual Time Calculation (min): 60 min   Short Term Goals: Week 1:  PT Short Term Goal 1 (Week 1): Pt will be able to perform bed mobility with mod assist PT Short Term Goal 2 (Week 1): Pt will be able to initiate transfers with max +2 PT Short Term Goal 3 (Week 1): Pt will be able to reorient to midline seated EOB with mod assist Week 2:    Week 3:     Skilled Therapeutic Interventions/Progress Updates:    PAIN Pt denies pain.   Pt initially supine and sleeping.  Daughter stated he was "resting after his ST session".  Pt easily woken. Pt found do have been incontinent of urine and needing brief change.  Pt rolls to L and r w/hand over hand to initiate and max assist to complete for change of brief and pericare by therapist. Pt able to lift LLE, max assist for RLE for threading of pants and application of socks. Pt rolls as above for competion of lower body dressing. Pt supine to L side to sit w/max assist of 2, max tactile cues for sequencing. Pt initially pushing to r w/LUE, when hands placed in lap and visual cues utilized pt able to achive and maintain midline w/cga.  Sitting balance assist w/upper body dressing required cga to max assist.  In sitting eucerin cream applied to pt back per Dr Naaman Plummer who spoke w/pt during session.   Pt requires max assist w/upper body dressing including undershirt and pajama top, significant apraxia limiting pt.  Shoes donned by therapist again w/noted apraxia when pt attempts/therapist handed pt L shoe but pt makes no attempt to position limb or shoe in prep for donning.  SPT bed to wc max assist of 2, pt pushing against transfer to L w/LUE on armrest.  Pt transported to gym for continued session.  STS in parallel bars w/max assist of 2, again pt pushing to R w/LUE on bar.   Stood x 1 min w/therapist providing heavy max assist to stabilize at hips/trunk/L knee and second person managing LUE to prevent pushing.  Repeated Standing including reaching activity w/LUE to promote extension and wt shift to L/inhibit pushing to R.  Hand over hand guidance required for engagement in task, max assist as above for standing.  Repeated x 2, standing approx 1 min each attempt.  Pt w/minimal acitvation of LLE/hips noted w/wbing activity.  Pt transported back to room at end of session. wc tilted for safety Pt left oob in wc w/alarm belt set and needs in reach  May benefit from standing frame to allow prolonged standing/wbing.      Therapy Documentation Precautions:  Precautions Precautions: Fall Precaution Comments: R hemi; R inattention Restrictions Weight Bearing Restrictions: No    Therapy/Group: Individual Therapy  Callie Fielding, [place] 09/13/2019, 12:16 PM

## 2019-09-13 NOTE — Progress Notes (Signed)
Speech Language Pathology Daily Session Note  Patient Details  Name: David [name] MRN: [id number] Date of Birth: [birth year]  Today's Date: 09/13/2019 SLP Individual Time: 0710-0755 SLP Individual Time Calculation (min): 45 min  Short Term Goals: Week 1: SLP Short Term Goal 1 (Week 1): Patient will utilize speech intelligibility strategies at the phrase level to achieve ~50% intelligibility with Max verbal cues. SLP Short Term Goal 2 (Week 1): Patient will verbalize wants/needs at the phrase level with Max verbal cues. SLP Short Term Goal 3 (Week 1): Patient will follow 2 step commands with 50% accuracy and Max verbal and visual cues. SLP Short Term Goal 4 (Week 1): Patient will demonstrate sustained attention to functional tasks for 5 minutes with Mod verbal cues for redirection. SLP Short Term Goal 5 (Week 1): Patient will demonstrate orientation to place, time and situation with Max A multimodal cues. SLP Short Term Goal 6 (Week 1): Patient will conusme current diet with minimal overt s/s of aspiration with Mod verbal cues for use of swallowing compensatory strategies.  Skilled Therapeutic Interventions: Skilled treatment session focused on dysphagia and speech goals. SLP facilitated session by providing skilled observation with breakfast meal of Dys. 1 textures with honey-thick liquids. Patient with one overt coughing episode, suspect due to decreased attention to bolus while attempting to scoop his next bite onto the spoon. Therefore, SLP took overt scooping the food but would then hand the spoon to the patient self self-feeding. No further overt s/s of aspiration were observed. Recommend to continue current diet. Patient with increased length of utterances when verbalizing resulting in decreased intelligibility. With Max verbal cues for use of speech intelligibility strategies, patient was ~75% intelligible. 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 Patient left upright in bed with alarm on and  daughter present. Continue with current plan of care.      Pain No/Denies Pain   Therapy/Group: Individual Therapy  David [name] 09/13/2019, 8:03 AM

## 2019-09-13 NOTE — Progress Notes (Signed)
Occupational Therapy Session Note  Patient Details  Name: David [name] MRN: [id number] Date of Birth: 1923-11-26  Today's Date: 09/13/2019 OT Individual Time: 1300-1400 OT Individual Time Calculation (min): 60 min   Short Term Goals: Week 1:  OT Short Term Goal 1 (Week 1): Pt will perform grooming with min A overall. OT Short Term Goal 2 (Week 1): Pt will maintain sitting balance during self care tasks with max A overall. OT Short Term Goal 3 (Week 1): Pt will perform UB dressing with max A. OT Short Term Goal 4 (Week 1): Pt will transfer with assist of 1 person.  Skilled Therapeutic Interventions/Progress Updates:     Pt greeted asleep semi-reclined in bed with daughter present. Pt easy to wake and agreeable to get up again with OT. Pt completed bed mobility with max/total A and max cues for initiation. Once seated EOB, was able to maintain sitting balance with mostly min A. Slideboard transfer from bed to TIS wc with max/total A +2. Pt brought down to therapy gym. Worked on sit<>stands in standing frame using harness to bring pt into standing. Facilitation at hips, chest, and head to bring pt into full upright posture. Used mirror feedback to also assist with posture. OT facilitated weight bearing through R UE while standing with towel pushes, but no activation noted through R UE. Pt maintained standing for ~5 minutes, then took rest break, and stood in standing from for another 3. Pt completed dynavision activity with focus on R visual scanning with max cues and hand over hand to initiate at first, then was able to progress to min verbal and tactile cues to locate light son R side of board. Pt returned to room and was left seated in TIS wc with alarm belt on, daughter present, and needs met.   Therapy Documentation Precautions:  Precautions Precautions: Fall Precaution Comments: R hemi; R inattention Restrictions Weight Bearing Restrictions: No Pain: Pain Assessment Pain Scale:  0-10 Pain Score: 0-No pain   Therapy/Group: Individual Therapy  Valma Cava 09/13/2019, 1:36 PM

## 2019-09-13 NOTE — Progress Notes (Signed)
[place] PHYSICAL MEDICINE & REHABILITATION PROGRESS NOTE   Subjective/Complaints: Pt working with therapy when I entered. Engaging with right arm and leg  ROS: Limited due to cognitive/behavioral   Objective:   DG Chest 2 View  Result Date: 09/13/2019 CLINICAL DATA:  Pneumonia EXAM: CHEST - 2 VIEW COMPARISON:  09/10/2019 FINDINGS: Low volume chest with interstitial coarsening that is unchanged. No focal consolidation. Stable normal heart size. 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 IMPRESSION: Low volume chest with no convincing pneumonia. Electronically Signed   By: Monte Fantasia M.D.   On: 09/13/2019 08:58   DG Swallowing Func-Speech Pathology  Result Date: 09/12/2019 Objective Swallowing Evaluation: Type of Study: MBS-Modified Barium Swallow Study  Patient Details Name: David [name] MRN: [id number] Date of Birth: [birth year] Today's Date: 09/12/2019 Time: SLP Start Time (ACUTE ONLY): 0900 -SLP Stop Time (ACUTE ONLY): 0930 SLP Time Calculation (min) (ACUTE ONLY): 30 min Past Medical History: Past Medical History: Diagnosis Date . CAD (coronary artery disease)   intolerance of statins . COPD (chronic obstructive pulmonary disease) ([place])  . Current use of long term anticoagulation  . GERD (gastroesophageal reflux disease)  . Hyperlipidemia  . Hypertension  . Impaired glucose tolerance 02/14/2011 . LBP (low back pain)  . OA (osteoarthritis)  . S/P AVR (aortic valve replacement) 2009  bonine valve Past Surgical History: Past Surgical History: Procedure Laterality Date . AORTIC VALVE REPLACEMENT  01/15/1999  bovine . CARDIAC CATHETERIZATION  10/05/1998  Recommend CABG revascularizaition . CARDIAC CATHETERIZATION  06/11/1999  Continue medical therapy . CARDIAC CATHETERIZATION  01/08/2007  Patent grafts, recommend medical therapy . CARDIOVASCULAR STRESS TEST  04/22/2010  Small lateral defect which is partially reversible, no ischemic EKG changes were noted. Marland Kitchen CAROTID DOPPLER  04/22/2010   Bilateral ICAs-no evidence of diametere reduction, significant tortuosity, or any other vascular abnormality. . CORONARY ARTERY BYPASS GRAFT  10/09/1998  LIMA to LAD, SVG to diag, SVG sequential to 2 marginals, SVG sequential to PDA & PLA. CE#25 porcine AVR. Marland Kitchen TRANSTHORACIC ECHOCARDIOGRAM  05/14/2012  EF 50-55%, bioprosthesis of the aortic valve with well preserved LV function, moderate concentric [place] HPI: See H&P  Subjective: alert Assessment / Plan / Recommendation CHL IP CLINICAL IMPRESSIONS 09/12/2019 Clinical Impression Patient presents with a moderate oropharyngeal dysphagia characterized by premature loss of bolus into the pharynx with delayed and inconsistent onset of pharyngeal swallow.  Patient demonstrated difficulty with self-feeding (question motor planning issues) and following commands requiring Max verbal and tactile cues for use of smaller sips. Patient would hold the cup up to his mouth with neck mildly hyperextended allowing liquids to fill the pharynx prior to swallow initiation.  This led to penetration with nectar-thick liquids via cup and silent aspiration of nectar-thick liquids via straw.  Recommend patient reinitiate conservative diet of Dys. 1 textures with honey-thick liquids to minimize aspiration risk due to patient's impaired and inconsistent cognitive-linguistic abilities, motor planning deficits and decreased mobility.  Educated daughter regarding results, she verbalized understanding. SLP Visit Diagnosis Dysphagia, oropharyngeal phase (R13.12) Attention and concentration deficit following -- Frontal lobe and executive function deficit following -- Impact on safety and function Moderate aspiration risk;Risk for inadequate nutrition/hydration   CHL IP TREATMENT RECOMMENDATION 09/12/2019 Treatment Recommendations Therapy as outlined in treatment plan below   Prognosis 09/06/2019 Prognosis for Safe Diet Advancement Good Barriers to Reach Goals -- Barriers/Prognosis Comment -- CHL IP DIET  RECOMMENDATION 09/12/2019 SLP Diet Recommendations Dysphagia 1 (Puree) solids;Honey thick liquids Liquid Administration via Cup;Spoon Medication Administration Crushed with  puree Compensations Minimize environmental distractions;Slow rate;Small sips/bites Postural Changes Seated upright at 90 degrees   CHL IP OTHER RECOMMENDATIONS 09/12/2019 Recommended Consults -- Oral Care Recommendations Oral care BID Other Recommendations Order thickener from pharmacy;Prohibited food (jello, ice cream, thin soups);Clarify dietary restrictions;Remove water pitcher;Have oral suction available   CHL IP FOLLOW UP RECOMMENDATIONS 09/12/2019 Follow up Recommendations Inpatient Rehab   CHL IP FREQUENCY AND DURATION 09/12/2019 Speech Therapy Frequency (ACUTE ONLY) min 3x week Treatment Duration 3 weeks      CHL IP ORAL PHASE 09/12/2019 Oral Phase Impaired Oral - Pudding Teaspoon -- Oral - Pudding Cup -- Oral - Honey Teaspoon Right anterior bolus loss;Delayed oral transit;Decreased bolus cohesion;Premature spillage Oral - Honey Cup Premature spillage;Decreased bolus cohesion;Delayed oral transit;Right anterior bolus loss Oral - Nectar Teaspoon Right anterior bolus loss;Premature spillage;Decreased bolus cohesion;Delayed oral transit Oral - Nectar Cup Delayed oral transit;Decreased bolus cohesion;Premature spillage;Right anterior bolus loss Oral - Nectar Straw Right anterior bolus loss;Premature spillage;Decreased bolus cohesion;Delayed oral transit Oral - Thin Teaspoon NT Oral - Thin Cup -- Oral - Thin Straw -- Oral - Puree Right anterior bolus loss;Delayed oral transit Oral - Mech Soft -- Oral - Regular -- Oral - Multi-Consistency -- Oral - Pill -- Oral Phase - Comment --  CHL IP PHARYNGEAL PHASE 09/12/2019 Pharyngeal Phase Impaired Pharyngeal- Pudding Teaspoon -- Pharyngeal -- Pharyngeal- Pudding Cup -- Pharyngeal -- Pharyngeal- Honey Teaspoon Delayed swallow initiation-vallecula Pharyngeal -- Pharyngeal- Honey Cup Delayed swallow  initiation-pyriform sinuses Pharyngeal -- Pharyngeal- Nectar Teaspoon Delayed swallow initiation-pyriform sinuses Pharyngeal Material does not enter airway Pharyngeal- Nectar Cup Delayed swallow initiation-pyriform sinuses;Penetration/Aspiration during swallow Pharyngeal Material enters airway, remains ABOVE vocal cords and not ejected out Pharyngeal- Nectar Straw Delayed swallow initiation-pyriform sinuses;Penetration/Aspiration during swallow Pharyngeal Material enters airway, passes BELOW cords without attempt by patient to eject out (silent aspiration) Pharyngeal- Thin Teaspoon NT Pharyngeal -- Pharyngeal- Thin Cup -- Pharyngeal -- Pharyngeal- Thin Straw -- Pharyngeal -- Pharyngeal- Puree Delayed swallow initiation-vallecula Pharyngeal -- Pharyngeal- Mechanical Soft -- Pharyngeal -- Pharyngeal- Regular -- Pharyngeal -- Pharyngeal- Multi-consistency -- Pharyngeal -- Pharyngeal- Pill -- Pharyngeal -- Pharyngeal Comment --  No flowsheet data found. PAYNE, [place] 09/12/2019, 9:42 AM    Weston Anna, MA, CCC-SLP [phone]           VAS [place] LOWER EXTREMITY VENOUS (DVT)  Result Date: 09/11/2019  Lower Venous DVTStudy Indications: Immobility.  Risk Factors: None identified. Comparison Study: No prior studies. Performing Technologist: Oliver Hum RVT  Examination Guidelines: A complete evaluation includes B-mode imaging, spectral Doppler, color Doppler, and power Doppler as needed of all accessible portions of each vessel. Bilateral testing is considered an integral part of a complete examination. Limited examinations for reoccurring indications may be performed as noted. The reflux portion of the exam is performed with the patient in reverse Trendelenburg.  +---------+---------------+---------+-----------+----------+--------------+ RIGHT    CompressibilityPhasicitySpontaneityPropertiesThrombus Aging +---------+---------------+---------+-----------+----------+--------------+ CFV      Full           Yes       Yes                                 +---------+---------------+---------+-----------+----------+--------------+ SFJ      Full                                                        +---------+---------------+---------+-----------+----------+--------------+  FV Prox  Full                                                        +---------+---------------+---------+-----------+----------+--------------+ FV Mid   Full                                                        +---------+---------------+---------+-----------+----------+--------------+ FV DistalFull                                                        +---------+---------------+---------+-----------+----------+--------------+ PFV      Full                                                        +---------+---------------+---------+-----------+----------+--------------+ POP      Full           Yes      Yes                                 +---------+---------------+---------+-----------+----------+--------------+ PTV      Full                                                        +---------+---------------+---------+-----------+----------+--------------+ PERO     Full                                                        +---------+---------------+---------+-----------+----------+--------------+   +---------+---------------+---------+-----------+----------+--------------+ LEFT     CompressibilityPhasicitySpontaneityPropertiesThrombus Aging +---------+---------------+---------+-----------+----------+--------------+ CFV      Full           Yes      Yes                                 +---------+---------------+---------+-----------+----------+--------------+ SFJ      Full                                                        +---------+---------------+---------+-----------+----------+--------------+ FV Prox  Full                                                         +---------+---------------+---------+-----------+----------+--------------+  FV Mid   Full                                                        +---------+---------------+---------+-----------+----------+--------------+ FV DistalFull                                                        +---------+---------------+---------+-----------+----------+--------------+ PFV      Full                                                        +---------+---------------+---------+-----------+----------+--------------+ POP      Full           Yes      Yes                                 +---------+---------------+---------+-----------+----------+--------------+ PTV      Full                                                        +---------+---------------+---------+-----------+----------+--------------+ PERO     Full                                                        +---------+---------------+---------+-----------+----------+--------------+     Summary: RIGHT: - There is no evidence of deep vein thrombosis in the lower extremity.  - No cystic structure found in the popliteal fossa.  LEFT: - There is no evidence of deep vein thrombosis in the lower extremity.  - No cystic structure found in the popliteal fossa.  *See table(s) above for measurements and observations. Electronically signed by Servando Snare MD on 09/11/2019 at 8:21:21 PM.    Final    Recent Labs    09/11/19 0729 09/12/19 0511  WBC 9.8 7.9  HGB 15.5 15.0  HCT 46.0 43.7  PLT 79* 90*   Recent Labs    09/11/19 0729 09/12/19 0511  NA 135 137  K 4.0 3.7  CL 106 107  CO2 17* 22  GLUCOSE 95 85  BUN 17 18  CREATININE 1.31* 1.28*  CALCIUM 8.4* 8.2*    Intake/Output Summary (Last 24 hours) at 09/13/2019 1016 Last data filed at 09/12/2019 1846 Gross per 24 hour  Intake 400 ml  Output 450 ml  Net -50 ml     Physical Exam: Vital Signs Blood pressure 126/64, pulse 70, temperature 98.1 F (36.7 C), resp.  rate 16, weight 112.3 kg, SpO2 99 %.  Constitutional: No distress . Vital signs reviewed. HEENT: EOMI, oral membranes moist, no irritation/drainage Neck: supple Cardiovascular: RRR without murmur. No JVD    Respiratory/Chest:  CTA Bilaterally without wheezes or rales. Normal effort    GI/Abdomen: BS +, non-tender, non-distended Ext: no clubbing, cyanosis, or edema Psych: pleasant and cooperative Musculoskeletal:  General: No deformityor edema.  Cervical back: Normal range of motion.  Uro: irritation around urethral meatus Neurological: alert. Usually keeps eyes closed. Follows basic commands with extra time. Occasionally utters intelligible words but otherwise very dysarthric, aphasic, apraxic.  Minimal active movement RUE/RLE but does engage during functional movements. Moves LUE and LLE more automatically. Senses pain in all 4's. Skin: Skin iswarm.abrasion right forehead. Maculopapular rash along back     Assessment/Plan: 1. Functional deficits secondary to left BG ICH which require 3+ hours per day of interdisciplinary therapy in a comprehensive inpatient rehab setting.  Physiatrist is providing close team supervision and 24 hour management of active medical problems listed below.  Physiatrist and rehab team continue to assess barriers to discharge/monitor patient progress toward functional and medical goals  Care Tool:  Bathing        Body parts bathed by helper: Front perineal area, Buttocks     Bathing assist Assist Level: 2 Helpers     Upper Body Dressing/Undressing Upper body dressing Upper body dressing/undressing activity did not occur (including orthotics): N/A What is the patient wearing?: Hospital gown only    Upper body assist Assist Level: Dependent - Patient 0%    Lower Body Dressing/Undressing Lower body dressing      What is the patient wearing?: Incontinence brief     Lower body assist Assist for lower body dressing: Dependent -  Patient 0%     Toileting Toileting    Toileting assist Assist for toileting: 2 Helpers     Transfers Chair/bed transfer  Transfers assist  Chair/bed transfer activity did not occur: Safety/medical concerns  Chair/bed transfer assist level: 2 Helpers     Locomotion Ambulation   Ambulation assist   Ambulation activity did not occur: Safety/medical concerns          Walk 10 feet activity   Assist  Walk 10 feet activity did not occur: Safety/medical concerns        Walk 50 feet activity   Assist Walk 50 feet with 2 turns activity did not occur: Safety/medical concerns         Walk 150 feet activity   Assist Walk 150 feet activity did not occur: Safety/medical concerns         Walk 10 feet on uneven surface  activity   Assist Walk 10 feet on uneven surfaces activity did not occur: Safety/medical concerns         Wheelchair     Assist Will patient use wheelchair at discharge?: Yes Type of Wheelchair: Manual Wheelchair activity did not occur: Safety/medical concerns(lethargy; TIS would be dependent)         Wheelchair 50 feet with 2 turns activity    Assist            Wheelchair 150 feet activity     Assist          Blood pressure 126/64, pulse 70, temperature 98.1 F (36.7 C), resp. rate 16, weight 112.3 kg, SpO2 99 %.  Medical Problem List and Plan: 1.Functional, mobility and cognitive deficitssecondary to left basal ganglia hemorrhage -patient may shower  -may participate in therapies as tolerated -ELOS/Goals: 22-28 days/min assist with PT, OT, SLP  -4/30 more alert last 2 days--continue to follow for any further changes   -recent CT with sl increase in hematoma size/surrounding edema 2.  Antithrombotics: -DVT/anticoagulation:Mechanical:Sequential compression devices, below kneeBilateral lower extremities--platelets stable to increased 90k -antiplatelet therapy:  N/A 3.Chronic LBP/Pain Management:Tylenol prn. 4. Mood:LCSW to follow for evaluation and support. -antipsychotic agents: N/A 5. Neuropsych: This patientis notcapable of making decisions on hisown behalf. 6. Skin/Wound Care:Routine pressure relief measures. 7. Fluids/Electrolytes/Nutrition:resumed po diet  -continue [facility] at [facility] for now  -4/29 labs ok. Recheck Monday 8. HTN: currently controlled off medications.SBP goal<140--will add prn catapres 9.H/oCOPD:Stable without meds. 10. Impaired glucose tolerance:CBG's wnl so far.  - SSI as indicated. 11. CAD/AVR:Off ASA due to bleed. No statin due to intolerance. Tenormin, cozaar and lasix (used prn) on hold.  -no TEE indicated 12. Thrombocytopenia: count stable at 90k today  13. AKI: Cr stable at 1.28 4/29 -continue IVF hs especially if he remains lethargic 14. Dysphagia:               -aspiration precautions  -D1/honeys resumed by SLP on 4/29  4/30-ate 0-60% meals yesterday         -continue HS IVF for now 15. Constipation: added Senna at bedtime as no BM since admission.  16. Low grade temp/lethargy  4/28-CXR suspicious for pneumonia, pt at risk for aspiration  4/29 now afebriile  -ua negative, ucx negative  4/30: day 4 zosyn    -pt appears much improved clinically as well   -f/u xray without any obvious disease   -dc zosyn and observe   -continue aspiration precautions/observation  LOS: 4 days A FACE TO FACE EVALUATION WAS PERFORMED  Meredith Staggers 09/13/2019, 10:16 AM

## 2019-09-13 NOTE — Progress Notes (Signed)
Occupational Therapy Session Note  Patient Details  Name: David [name] MRN: [id number] Date of Birth: 1924/03/08  Today's Date: 09/13/2019 OT Individual Time: [id number] OT Individual Time Calculation (min): 10 min     Short Term Goals: Week 1:  OT Short Term Goal 1 (Week 1): Pt will perform grooming with min A overall. OT Short Term Goal 2 (Week 1): Pt will maintain sitting balance during self care tasks with max A overall. OT Short Term Goal 3 (Week 1): Pt will perform UB dressing with max A. OT Short Term Goal 4 (Week 1): Pt will transfer with assist of 1 person.  Skilled Therapeutic Interventions/Progress Updates:    1;1. Pt received in bed and agreeable to saebo. Pt requesting to doff shirt. Pt scooted to [facility] with A from daughter to stabilize feet and OT pulling on chuck with VC for technique to daughter and pt. Pt rolls with MIN A and increased time to R and MAX A from OT to L with VC for sequencing/technique to doff shirt off back. Ot applies saebow stim to deltoid for improved shoulder approximation for 60 min with skin in tact and pt tolerated well. Exited session with pt seated in bed, exi talram on and daughter present in room  Saebo Stim One 330 pulse width 35 Hz pulse rate On 8 sec/ off 8 sec Ramp up/ down 2 sec Symmetrical Biphasic wave form  Max intensity 135mA at 500 Ohm load   Therapy Documentation Precautions:  Precautions Precautions: Fall Precaution Comments: R hemi; R inattention Restrictions Weight Bearing Restrictions: No General:   Vital Signs:   Pain: Pain Assessment Pain Scale: 0-10 Pain Score: 0-No pain ADL:   Vision   Perception    Praxis   Exercises:   Other Treatments:     Therapy/Group: Individual Therapy  Tonny Branch 09/13/2019, 10:58 AM

## 2019-09-14 ENCOUNTER — Inpatient Hospital Stay (HOSPITAL_COMMUNITY): Payer: Medicare Other | Admitting: Physical Therapy

## 2019-09-14 ENCOUNTER — Inpatient Hospital Stay (HOSPITAL_COMMUNITY): Payer: Medicare Other

## 2019-09-14 ENCOUNTER — Inpatient Hospital Stay (HOSPITAL_COMMUNITY): Payer: Medicare Other | Admitting: Speech Pathology

## 2019-09-14 DIAGNOSIS — R7309 Other abnormal glucose: Secondary | ICD-10-CM

## 2019-09-14 DIAGNOSIS — N179 Acute kidney failure, unspecified: Secondary | ICD-10-CM

## 2019-09-14 DIAGNOSIS — R739 Hyperglycemia, unspecified: Secondary | ICD-10-CM | POA: Insufficient documentation

## 2019-09-14 DIAGNOSIS — I69391 Dysphagia following cerebral infarction: Secondary | ICD-10-CM

## 2019-09-14 LAB — GLUCOSE, CAPILLARY
Glucose-Capillary: 98 mg/dL (ref 70–99)
Glucose-Capillary: 99 mg/dL (ref 70–99)
Glucose-Capillary: 91 mg/dL (ref 70–99)
Glucose-Capillary: 108 mg/dL — ABNORMAL HIGH (ref 70–99)

## 2019-09-14 NOTE — Progress Notes (Signed)
Occupational Therapy Session Note  Patient Details  Name: David [name] MRN: [id number] Date of Birth: 15-Jan-1924  Today's Date: 09/14/2019 OT Individual Time: 1300-1345 OT Individual Time Calculation (min): 45 min    Short Term Goals: Week 1:  OT Short Term Goal 1 (Week 1): Pt will perform grooming with min A overall. OT Short Term Goal 2 (Week 1): Pt will maintain sitting balance during self care tasks with max A overall. OT Short Term Goal 3 (Week 1): Pt will perform UB dressing with max A. OT Short Term Goal 4 (Week 1): Pt will transfer with assist of 1 person.  Skilled Therapeutic Interventions/Progress Updates:    1;1. Pt received in bed agreeable to OT with no pain, daughter and son present for session. Pt completes supine>sitting EOB with MAX A of 1 person and overall MIN A for static sitting balance increasing to MOD A with fatigue. Pt dons shirt with MAX A overall but able to lift RUE with LUE to place into sleeve and A OT to pull up past elbow. Pt able to thread head, but req A to thread LUE d/t IV. Pt sit to stand MAX A +2 in stedy to change pants/brief/transfer into TIS total A. Pt with skin tear (old) bleeding on R through bandage. RN alerted and changes. Exited session with pt seated in TIS, belt alarm on and call light in reach  Therapy Documentation Precautions:  Precautions Precautions: Fall Precaution Comments: R hemi; R inattention Restrictions Weight Bearing Restrictions: No General:   Vital Signs:   Pain:   ADL:   Vision   Perception    Praxis   Exercises:   Other Treatments:     Therapy/Group: Individual Therapy  Tonny Branch 09/14/2019, 1:03 PM

## 2019-09-14 NOTE — Progress Notes (Signed)
[place] PHYSICAL MEDICINE & REHABILITATION PROGRESS NOTE   Subjective/Complaints: Patient seen laying in bed this morning.  No reported issues overnight.  No changes per discussion with staff.  ROS: Limited due to cognition  Objective:   DG Chest 2 View  Result Date: 09/13/2019 CLINICAL DATA:  Pneumonia EXAM: CHEST - 2 VIEW COMPARISON:  09/10/2019 FINDINGS: Low volume chest with interstitial coarsening that is unchanged. No focal consolidation. Stable normal heart size. 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 IMPRESSION: Low volume chest with no convincing pneumonia. Electronically Signed   By: Monte Fantasia M.D.   On: 09/13/2019 08:58   Recent Labs    09/12/19 0511  WBC 7.9  HGB 15.0  HCT 43.7  PLT 90*   Recent Labs    09/12/19 0511  NA 137  K 3.7  CL 107  CO2 22  GLUCOSE 85  BUN 18  CREATININE 1.28*  CALCIUM 8.2*    Intake/Output Summary (Last 24 hours) at 09/14/2019 1137 Last data filed at 09/14/2019 0813 Gross per 24 hour  Intake 120 ml  Output --  Net 120 ml     Physical Exam: Vital Signs Blood pressure 127/61, pulse 71, temperature 98.1 F (36.7 C), resp. rate 16, weight 112.3 kg, SpO2 96 %. Constitutional: No distress . Vital signs reviewed. HENT: Normocephalic.  Atraumatic. Eyes: EOMI. No discharge. Cardiovascular: No JVD. Respiratory: Normal effort.  No stridor. GI: Non-distended. Skin: Warm and dry.  Intact. Psych: Normal mood.  Normal behavior. Musc: No edema in extremities.  No tenderness in extremities. Neurological: Alert Global aphasia Does not follow commands, however spontaneously moving left upper extremity  Assessment/Plan: 1. Functional deficits secondary to left BG ICH which require 3+ hours per day of interdisciplinary therapy in a comprehensive inpatient rehab setting.  Physiatrist is providing close team supervision and 24 hour management of active medical problems listed below.  Physiatrist and rehab team  continue to assess barriers to discharge/monitor patient progress toward functional and medical goals  Care Tool:  Bathing        Body parts bathed by helper: Front perineal area, Buttocks     Bathing assist Assist Level: 2 Helpers     Upper Body Dressing/Undressing Upper body dressing Upper body dressing/undressing activity did not occur (including orthotics): N/A What is the patient wearing?: Hospital gown only    Upper body assist Assist Level: Dependent - Patient 0%    Lower Body Dressing/Undressing Lower body dressing      What is the patient wearing?: Incontinence brief     Lower body assist Assist for lower body dressing: Dependent - Patient 0%     Toileting Toileting    Toileting assist Assist for toileting: 2 Helpers     Transfers Chair/bed transfer  Transfers assist  Chair/bed transfer activity did not occur: Safety/medical concerns  Chair/bed transfer assist level: 2 Helpers     Locomotion Ambulation   Ambulation assist   Ambulation activity did not occur: Safety/medical concerns          Walk 10 feet activity   Assist  Walk 10 feet activity did not occur: Safety/medical concerns        Walk 50 feet activity   Assist Walk 50 feet with 2 turns activity did not occur: Safety/medical concerns         Walk 150 feet activity   Assist Walk 150 feet activity did not occur: Safety/medical concerns         Walk 10 feet on  uneven surface  activity   Assist Walk 10 feet on uneven surfaces activity did not occur: Safety/medical concerns         Wheelchair     Assist Will patient use wheelchair at discharge?: Yes Type of Wheelchair: Manual Wheelchair activity did not occur: Safety/medical concerns(lethargy; TIS would be dependent)         Wheelchair 50 feet with 2 turns activity    Assist            Wheelchair 150 feet activity     Assist          Blood pressure 127/61, pulse 71, temperature  98.1 F (36.7 C), resp. rate 16, weight 112.3 kg, SpO2 96 %.  Medical Problem List and Plan: 1.Functional, mobility and cognitive deficitssecondary to left basal ganglia hemorrhage  -recent CT with sl increase in hematoma size/surrounding edema  Continue CIR 2. Antithrombotics: -DVT/anticoagulation:Mechanical:Sequential compression devices, below kneeBilateral lower extremities--platelets stable to increased 90k -antiplatelet therapy: N/A 3.Chronic LBP/Pain Management:Tylenol prn. 4. Mood:LCSW to follow for evaluation and support. -antipsychotic agents: N/A 5. Neuropsych: This patientis notcapable of making decisions on hisown behalf. 6. Skin/Wound Care:Routine pressure relief measures. 7. Fluids/Electrolytes/Nutrition:resumed po diet  -continue IVF at [facility] for now  Labs ordered for Monday 8. HTN: currently controlled off medications.SBP goal<140--added prn catapres  Slightly labile on 5/1 9.H/oCOPD:Stable without meds. 10. Impaired glucose tolerance:CBG's wnl so far.  - SSI as indicated.  Relatively controlled on 5/1 11. CAD/AVR:Off ASA due to bleed. No statin due to intolerance. Tenormin, cozaar and lasix (used prn) on hold.  -no TEE indicated 12. Thrombocytopenia:   Platelets 90 on 4/29 13. 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 Constipation: Senna at bedtime   16. Low grade temp/lethargy: Improving  Zosyn DC'd  Chest x-ray was unremarkable for infection  LOS: 5 days A FACE TO FACE EVALUATION WAS PERFORMED  Malcolm Quast [name] 09/14/2019, 11:37 AM

## 2019-09-14 NOTE — Progress Notes (Signed)
Physical Therapy Session Note  Patient Details  Name: David [name] MRN: [id number] Date of Birth: 06/11/1923  Today's Date: 09/14/2019 PT Individual Time: 5051-8335 PT Individual Time Calculation (min): 43 min   Short Term Goals: Week 1:  PT Short Term Goal 1 (Week 1): Pt will be able to perform bed mobility with mod assist PT Short Term Goal 2 (Week 1): Pt will be able to initiate transfers with max +2 PT Short Term Goal 3 (Week 1): Pt will be able to reorient to midline seated EOB with mod assist  Skilled Therapeutic Interventions/Progress Updates:   Pt received supine in bed and agreeable to PT. Pt noted to have been incontinent of bladder. Rolling R and L with max assist R and L with max cues for neuromotor recruitment and sequencing. Supine>sit transfer with max assist on the L side. Sitting EOB with min assist progressing to supervision assist with heavy UE support on bed rail and moderate cues for midline. Squat pivot transfer to the R with max assist +2 for safety. Pt transported to orthogym in [place]. Standing frame x 8 minutes to perform lateral/forward reach with the LUE to improve weight shift L, improve midline orientation, and facilitate extension through RLE wqith max-total A from PT. Patient returned to room and left sitting in [name] with call bell in reach and all needs met.         Therapy Documentation Precautions:  Precautions Precautions: Fall Precaution Comments: R hemi; R inattention Restrictions Weight Bearing Restrictions: No   Pain: Pain Assessment Pain Scale: Faces Faces Pain Scale: No hurt   Therapy/Group: Individual Therapy  [name] 09/14/2019, 9:47 AM

## 2019-09-14 NOTE — Progress Notes (Addendum)
Speech Language Pathology Daily Session Note  Patient Details  Name: David [name] MRN: [id number] Date of Birth: Jan 11, 1924  Today's Date: 09/14/2019 SLP Individual Time: [id number] SLP Individual Time Calculation (min): 30 min  Short Term Goals: Week 1: SLP Short Term Goal 1 (Week 1): Patient will utilize speech intelligibility strategies at the phrase level to achieve ~50% intelligibility with Max verbal cues. SLP Short Term Goal 2 (Week 1): Patient will verbalize wants/needs at the phrase level with Max verbal cues. SLP Short Term Goal 3 (Week 1): Patient will follow 2 step commands with 50% accuracy and Max verbal and visual cues. SLP Short Term Goal 4 (Week 1): Patient will demonstrate sustained attention to functional tasks for 5 minutes with Mod verbal cues for redirection. SLP Short Term Goal 5 (Week 1): Patient will demonstrate orientation to place, time and situation with Max A multimodal cues. SLP Short Term Goal 6 (Week 1): Patient will conusme current diet with minimal overt s/s of aspiration with Mod verbal cues for use of swallowing compensatory strategies.  Skilled Therapeutic Interventions: Patient received skilled SLP services targeting expressive language. Patient independently read single functional words (bed, pain, etc) with 100% independently. Patient read short functional ADL sentences with 80% accuracy and min verbal cues. Patient participated in a confrontation naming task with 50% accuracy following binary choices. Son was provided picture communication board to utilize with patient. At the end of therapy session patient was upright in bed with son present.  Pain Pain Assessment Pain Scale: Faces Faces Pain Scale: No hurt  Therapy/Group: Individual Therapy  Cristy Folks 09/14/2019, 8:55 AM

## 2019-09-15 ENCOUNTER — Inpatient Hospital Stay (HOSPITAL_COMMUNITY): Payer: Medicare Other

## 2019-09-15 DIAGNOSIS — R0989 Other specified symptoms and signs involving the circulatory and respiratory systems: Secondary | ICD-10-CM

## 2019-09-15 LAB — GLUCOSE, CAPILLARY
Glucose-Capillary: 95 mg/dL (ref 70–99)
Glucose-Capillary: 110 mg/dL — ABNORMAL HIGH (ref 70–99)
Glucose-Capillary: 107 mg/dL — ABNORMAL HIGH (ref 70–99)
Glucose-Capillary: 120 mg/dL — ABNORMAL HIGH (ref 70–99)

## 2019-09-15 NOTE — Progress Notes (Signed)
Physical Therapy Session Note  Patient Details  Name: David [name] MRN: [id number] Date of Birth: 05/13/1924  Today's Date: 09/15/2019 PT Individual Time: 1100-1200 PT Individual Time Calculation (min): 60 min   Short Term Goals: Week 1:  PT Short Term Goal 1 (Week 1): Pt will be able to perform bed mobility with mod assist PT Short Term Goal 2 (Week 1): Pt will be able to initiate transfers with max +2 PT Short Term Goal 3 (Week 1): Pt will be able to reorient to midline seated EOB with mod assist  Skilled Therapeutic Interventions/Progress Updates:     Patient in bed asleep with his daughter at bedside upon PT arrival. Patient easily aroused to therapist saying his name and agreeable to PT session. Patient indicated some pain with mobility by grimacing and guarding his R shoulder and arm, unable to rate or describe due to expressive aphasia and cognitive deficits, RN made aware. PT provided repositioning, rest breaks, and distraction as pain interventions throughout session. Patient's R UE and LE continue to have increased tone with PROM, imrproved with increased mobility during session. Patient also reported "I did not sleep at all last night" then preceded to perseverate on "I just couldn't find it." Patient's daughter expressed some frustration about not being able to understand the patient all the time and figure out what he needs to be comfortable, but noted several signs of discomfort in the patient this morning. Provided general education and strategies for communicating with patient with expressive aphasia and demonstrated bed positioning with patient alternating lying on his back with R UE elevated and lying slightly on his L side with pillows behind his back, under his R arm, and between his knees for comfort. Patient participated with max-total A during positioning.   Therapeutic Activity: Bed Mobility: Patient performed supine to sit with max A of 1 person and a second person for SBA  for safety in a flat bed without use of bed rails. Provided verbal cues for rolling to L side-lying to push up with his L arm. Transfers: Patient performed a lateral scoot transfer bed>TIS w/c with max-mod A +2. Provided cues for hand placement, board placement, increased LE activation, and head-hips relationship for proper technique and decreased assist with transfers. Noted increased L LE activation with cues assisting transfers. Patient performed sit to/from stand using the [facility] with max A +2 from the w/c and Stedy seat using L UE to pull himself up. Provided verbal cues for forward weight shift, manual facilitation for foot placement R>L, patient initiated grabbing the bar with his L UE spontaneously, and provided cues for increased gluteal activation and erect posture in standing. Patient with increased R lean in standing, pushing through L UE with max A for fascination to midline and patient unable to attain midline prior to needed to sit due to fatigue.   Neuromuscular Re-ed: Patient performed the following sitting balance activities: -static sitting balance x5-6 min progressing from max-mod A due to significant posterior lean to min A with mild posterior lean -cervical extension in sitting x4 with multi-modal cues and manual facilitation to attain a neutral head alignment from full flexed posture  Patient with increased attention to R side today, reporting pain and able to hold R UE with his L hand to assist with positioning during session. Also able to hold gaze past midline to the R 3-4 times during session.   Patient in [place] w/c with R UE elevated and his daughter in the room at end  of session with breaks locked, seat belt alarm set, and all needs within reach.    Therapy Documentation Precautions:  Precautions Precautions: Fall Precaution Comments: R hemi; R inattention Restrictions Weight Bearing Restrictions: No    Therapy/Group: Individual Therapy  David [name] L David [name] PT,  DPT  09/15/2019, 3:46 PM

## 2019-09-15 NOTE — Progress Notes (Signed)
Occupational Therapy Session Note  Patient Details  Name: David [name] MRN: [id number] Date of Birth: June 02, 1923  Today's Date: 09/15/2019 OT Individual Time: 1304-1400 OT Individual Time Calculation (min): 56 min    Short Term Goals: Week 1:  OT Short Term Goal 1 (Week 1): Pt will perform grooming with min A overall. OT Short Term Goal 2 (Week 1): Pt will maintain sitting balance during self care tasks with max A overall. OT Short Term Goal 3 (Week 1): Pt will perform UB dressing with max A. OT Short Term Goal 4 (Week 1): Pt will transfer with assist of 1 person.  Skilled Therapeutic Interventions/Progress Updates:  Patient met seated in wc in agreement with OT treatment session. Daughter present at bedside. Patient pulling at clothing. Return to supine in prep for toileting via squat-pivot transfer with Max A +2. In supine, patient required Max A and use of draw sheet for rolling R<>L. Patient incontinent of bladder. Supine to EOB with Mod A +2 at trunk and to assist BLE to EOB. Patient able to maintain static sitting at EOB with Min A and BUE supported on bed surface. Total A for wc transport to dayroom for painting activity. Seated at high low table in TIS wc, patient able to follow 1-step verbal commands with 65% accuracy to select rock, gather paint and paint brush and complete task. Total assist for use of RUE as a stabilizer throughout task. Session concluded with patient seated in TIS wc with belt alarm activated, call bell within reach and daughter present at bedside. Education provided to daughter to sit on patients R to encourage attention to R visual field.   Therapy Documentation Precautions:  Precautions Precautions: Fall Precaution Comments: R hemi; R inattention Restrictions Weight Bearing Restrictions: No  Therapy/Group: Individual Therapy  Shizuko Wojdyla R Howerton-Davis 09/15/2019, 1:18 PM

## 2019-09-15 NOTE — Progress Notes (Signed)
David [name] PHYSICAL MEDICINE & REHABILITATION PROGRESS NOTE   Subjective/Complaints: Patient seen laying in bed this morning.  No reported issues overnight.  ROS: Limited due to cognition  Objective:   No results found. No results for input(s): WBC, HGB, HCT, PLT in the last 72 hours. No results for input(s): NA, K, CL, CO2, GLUCOSE, BUN, CREATININE, CALCIUM in the last 72 hours.  Intake/Output Summary (Last 24 hours) at 09/15/2019 0848 Last data filed at 09/14/2019 1904 Gross per 24 hour  Intake 280 ml  Output --  Net 280 ml     Physical Exam: Vital Signs Blood pressure (!) 146/78, pulse 69, temperature 98.3 F (36.8 C), resp. rate 18, weight 112.3 kg, SpO2 98 %. Constitutional: No distress . Vital signs reviewed. HENT: Normocephalic.  Atraumatic. Eyes: EOMI. No discharge. Cardiovascular: No JVD. Respiratory: Normal effort.  No stridor. GI: Non-distended. Skin: Warm and dry.  Intact. Psych: Normal mood.  Normal behavior. Musc: No edema in extremities.  No tenderness in extremities. Neurological: Alert Global aphasia, unchanged Not following commands.  Assessment/Plan: 1. Functional deficits secondary to left BG ICH which require 3+ hours per day of interdisciplinary therapy in a comprehensive inpatient rehab setting.  Physiatrist is providing close team supervision and 24 hour management of active medical problems listed below.  Physiatrist and rehab team continue to assess barriers to discharge/monitor patient progress toward functional and medical goals  Care Tool:  Bathing        Body parts bathed by helper: Front perineal area, Buttocks     Bathing assist Assist Level: 2 Helpers     Upper Body Dressing/Undressing Upper body dressing Upper body dressing/undressing activity did not occur (including orthotics): N/A What is the patient wearing?: Hospital gown only    Upper body assist Assist Level: Dependent - Patient 0%    Lower Body  Dressing/Undressing Lower body dressing      What is the patient wearing?: Incontinence brief     Lower body assist Assist for lower body dressing: Dependent - Patient 0%     Toileting Toileting    Toileting assist Assist for toileting: 2 Helpers     Transfers Chair/bed transfer  Transfers assist  Chair/bed transfer activity did not occur: Safety/medical concerns  Chair/bed transfer assist level: 2 Helpers     Locomotion Ambulation   Ambulation assist   Ambulation activity did not occur: Safety/medical concerns          Walk 10 feet activity   Assist  Walk 10 feet activity did not occur: Safety/medical concerns        Walk 50 feet activity   Assist Walk 50 feet with 2 turns activity did not occur: Safety/medical concerns         Walk 150 feet activity   Assist Walk 150 feet activity did not occur: Safety/medical concerns         Walk 10 feet on uneven surface  activity   Assist Walk 10 feet on uneven surfaces activity did not occur: Safety/medical concerns         Wheelchair     Assist Will patient use wheelchair at discharge?: Yes Type of Wheelchair: Manual Wheelchair activity did not occur: Safety/medical concerns(lethargy; TIS would be dependent)         Wheelchair 50 feet with 2 turns activity    Assist            Wheelchair 150 feet activity     Assist  Blood pressure (!) 146/78, pulse 69, temperature 98.3 F (36.8 C), resp. rate 18, weight 112.3 kg, SpO2 98 %.  Medical Problem List and Plan: 1.Functional, mobility and cognitive deficitssecondary to left basal ganglia hemorrhage  -recent CT with sl increase in hematoma size/surrounding edema  Continue CIR 2. Antithrombotics: -DVT/anticoagulation:Mechanical:Sequential compression devices, below kneeBilateral lower extremities--platelets stable to increased 90k -antiplatelet therapy: N/A 3.Chronic LBP/Pain  Management:Tylenol prn. 4. Mood:LCSW to follow for evaluation and support. -antipsychotic agents: N/A 5. Neuropsych: This patientis notcapable of making decisions on hisown behalf. 6. Skin/Wound Care:Routine pressure relief measures. 7. Fluids/Electrolytes/Nutrition:resumed po diet  -continue IVF at [facility] for now  Labs ordered for tomorrow 8. HTN: currently controlled off medications.SBP goal<140--added prn catapres  Slightly labile on 5/2, monitor for trend 9.H/oCOPD:Stable without meds. 10. Impaired glucose tolerance:CBG's wnl so far.  - SSI as indicated.  Controlled on 5/2 11. CAD/AVR:Off ASA due to bleed. No statin due to intolerance. Tenormin, cozaar and lasix (used prn) on hold.  -no TEE indicated 12. Thrombocytopenia:   Platelets 90 on 4/29 13. 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 Constipation: Senna at bedtime   16. Low grade temp/lethargy: Resolved  Zosyn DC'd  Chest x-ray was unremarkable for infection  LOS: 6 days A FACE TO FACE EVALUATION WAS PERFORMED  David [name] David [name] 09/15/2019, 8:48 AM

## 2019-09-16 ENCOUNTER — Inpatient Hospital Stay (HOSPITAL_COMMUNITY): Payer: Medicare Other | Admitting: Speech Pathology

## 2019-09-16 ENCOUNTER — Inpatient Hospital Stay (HOSPITAL_COMMUNITY): Payer: Medicare Other | Admitting: Occupational Therapy

## 2019-09-16 ENCOUNTER — Inpatient Hospital Stay (HOSPITAL_COMMUNITY): Payer: Medicare Other

## 2019-09-16 LAB — CBC WITH DIFFERENTIAL/PLATELET
Abs Immature Granulocytes: 0.03 10*3/uL (ref 0.00–0.07)
Basophils Absolute: 0 10*3/uL (ref 0.0–0.1)
Basophils Relative: 1 %
Eosinophils Absolute: 0.2 10*3/uL (ref 0.0–0.5)
Eosinophils Relative: 3 %
HCT: 43.2 % (ref 39.0–52.0)
Hemoglobin: 14.7 g/dL (ref 13.0–17.0)
Immature Granulocytes: 0 %
Lymphocytes Relative: 24 %
Lymphs Abs: 2 10*3/uL (ref 0.7–4.0)
MCH: 30.9 pg (ref 26.0–34.0)
MCHC: 34 g/dL (ref 30.0–36.0)
MCV: 90.8 fL (ref 80.0–100.0)
Monocytes Absolute: 0.7 10*3/uL (ref 0.1–1.0)
Monocytes Relative: 9 %
Neutro Abs: 5.2 10*3/uL (ref 1.7–7.7)
Neutrophils Relative %: 63 %
Platelets: 76 10*3/uL — ABNORMAL LOW (ref 150–400)
RBC: 4.76 MIL/uL (ref 4.22–5.81)
RDW: 13.6 % (ref 11.5–15.5)
WBC: 8.2 10*3/uL (ref 4.0–10.5)
nRBC: 0 % (ref 0.0–0.2)

## 2019-09-16 LAB — BASIC METABOLIC PANEL
Anion gap: 7 (ref 5–15)
BUN: 9 mg/dL (ref 8–23)
CO2: 24 mmol/L (ref 22–32)
Calcium: 8.5 mg/dL — ABNORMAL LOW (ref 8.9–10.3)
Chloride: 107 mmol/L (ref 98–111)
Creatinine, Ser: 1.08 mg/dL (ref 0.61–1.24)
GFR calc Af Amer: >60 mL/min (ref >60)
GFR calc non Af Amer: 58 mL/min — ABNORMAL LOW (ref >60)
Glucose, Bld: 103 mg/dL — ABNORMAL HIGH (ref 70–99)
Potassium: 4 mmol/L (ref 3.5–5.1)
Sodium: 138 mmol/L (ref 135–145)

## 2019-09-16 LAB — GLUCOSE, CAPILLARY
Glucose-Capillary: 91 mg/dL (ref 70–99)
Glucose-Capillary: 102 mg/dL — ABNORMAL HIGH (ref 70–99)
Glucose-Capillary: 115 mg/dL — ABNORMAL HIGH (ref 70–99)
Glucose-Capillary: 107 mg/dL — ABNORMAL HIGH (ref 70–99)

## 2019-09-16 NOTE — Progress Notes (Signed)
Orthopedic Tech Progress Note Patient Details:  David [name] 1924-01-27 [id number] Called in order to [employer] for a RESTING HAND SPLINT  Patient ID: David [name], male   DOB: 11-07-1923, 86 y.o.   MRN: [id number]   Janit Pagan 09/16/2019, 1:00 PM

## 2019-09-16 NOTE — Progress Notes (Signed)
Physical Therapy Session Note  Patient Details  Name: David [name] MRN: [id number] Date of Birth: 1924/03/24  Today's Date: 09/16/2019 PT Individual Time: [id number] PT Individual Time Calculation (min): 24 min   Short Term Goals: Week 1:  PT Short Term Goal 1 (Week 1): Pt will be able to perform bed mobility with mod assist PT Short Term Goal 2 (Week 1): Pt will be able to initiate transfers with max +2 PT Short Term Goal 3 (Week 1): Pt will be able to reorient to midline seated EOB with mod assist  Skilled Therapeutic Interventions/Progress Updates:     Patient in bed asleep with his 2 daughters at bedside upon PT arrival. Patient easily aroused to verbal stimulation and agreeable to PT session. Patient denied pain during session. He appeared very fatigued this afternoon with difficulty keeping his eyes open when not engaged. Patient's daughters reported that he had just had a BM with nursing staff. Focused session on teaching family PROM exercises for improved R UE and LE ROM and positioning in bed.  Noted tightness of B heel cords R>L, demonstrated gentle DF stretch, performed 2x30 seconds with patient's daughters performing 1 trial each. Demonstrated elbow flexion/extension ROM with slow, gentle hold to release resistance from spasticity 2x5 with patient's daughter performing second set Performed shoulder flexion to 90 degrees 2x5, provided support and upward rotation to scapula to reduce shoulder pain with ROM, educated patient's daughter to allow therapy to perform this as it can result in increased pain/injury if performed incorrectly Performed knee and hip flexion/extension 2x5, patient's daughter attempted to assist x1, patient's leg was too heavy at this time will wait for family to assist based on progress.   Provided education on spasticity, importance of maintaining ROM, and performing stretching/ROM 2x per day outside of therapies.   CPO from [facility] came to deliver hand splint  for patient during session. Provided education on wear schedule, placement, and positioning in splint. Patient's daughter's stated understanding. Patient falling asleep during education.   Patient in bed with R hand splint donned and elevated with his daughters in the room at end of session with breaks locked, bed alarm set, and all needs within reach. Patient missed 6 min of skilled PT due to patient fatigue, RN made aware. Will attempt to make-up missed time as able.      Therapy Documentation Precautions:  Precautions Precautions: Fall Precaution Comments: R hemi; R inattention Restrictions Weight Bearing Restrictions: No General: PT Amount of Missed Time (min): 6 Minutes PT Missed Treatment Reason: Patient fatigue    Therapy/Group: Individual Therapy  Blue Ruggerio L Filiberto Wamble PT, DPT  09/16/2019, 5:00 PM

## 2019-09-16 NOTE — Progress Notes (Addendum)
Occupational Therapy Weekly Progress Note  Patient Details  Name: David [name] MRN: [id number] Date of Birth: Nov 03, 1923  Beginning of progress report period: September 10, 2019 End of progress report period: Sep 16, 2019  Today's Date: 09/16/2019 Session 1 OT Individual Time: 0973-5329 OT Individual Time Calculation (min): 59 min   Session 2 OT Individual Time: 1345-1415 OT Individual Time Calculation (min): 30 min    Patient has met 3 of 4 short term goals.  Pt has made slow, but steady progress towards OT goals. Pt has had some bouts of lethargy this week, but for the most part, he has been participating well. Pt continues to need +2 assistance for bed mobility and transfers. He is total A for LB ADLs, but could don shirt today with max A.   Patient continues to demonstrate the following deficits: muscle weakness and muscle joint tightness, decreased cardiorespiratoy endurance, decreased attention to right and decreased motor planning, decreased initiation, decreased attention, decreased awareness, decreased problem solving, decreased safety awareness, decreased memory and delayed processing and therefore will continue to benefit from skilled OT intervention to enhance overall performance with BADL and Reduce care partner burden.  Patient progressing toward long term goals..  Continue plan of care.  OT Short Term Goals Week 1:  OT Short Term Goal 1 (Week 1): Pt will perform grooming with min A overall. OT Short Term Goal 1 - Progress (Week 1): Met OT Short Term Goal 2 (Week 1): Pt will maintain sitting balance during self care tasks with max A overall. OT Short Term Goal 2 - Progress (Week 1): Met OT Short Term Goal 3 (Week 1): Pt will perform UB dressing with max A. OT Short Term Goal 3 - Progress (Week 1): Met OT Short Term Goal 4 (Week 1): Pt will transfer with assist of 1 person. OT Short Term Goal 4 - Progress (Week 1): Progressing toward goal Week 2:  OT Short Term Goal 1 (Week  2): Pt will transfer with assist of 1 person. OT Short Term Goal 2 (Week 2): Pt will maintain sitting balance with no more than mod A within BADL tasks OT Short Term Goal 3 (Week 2): Pt will demonstrate self-ROM of L UE OT Short Term Goal 4 (Week 2): Pt will locate 2 items on L side of the sink with mod questioning cues  Skilled Therapeutic Interventions/Progress Updates:  Session 1   Pt greeted semi-reclined in bed, a little more lethargic today. 2 daughters present. Worked on bed mobility rolling with max A +2. Pt incontinent of bowel and bladder. Pt started to have more BM when OT cleaning pt, so pt placed on bed-pan. Pt able to have a little more BM on bed pan. Total A for peri-care and brief change. Total A to thread pants and pull them up using rolling method. Max +2 to sit pt up at EOB. Worked on sitting balance at EOB with min A overall, and intermittent mod/max with increased task demand and when removing L UE support. UB bathing/dressing completed from EOB while working on balance. Max A for UB bathing/dressing. Stedy used for sit<>stand with bed raised and max+2. Then facilitation for upright trunk position while perched in [place]. Pt transferred to wc in [place] with +2 assist 2/2 pt pushing to R side. Pt brought to the sink in wc and worked on brushing teeth using suction oral care kit. Mod verbal cues to initiate and terminate task. OT provided pt and family on self ROM and positioning  of hemi side. OT placed SAEBO e-stim on wrist extensors. SAEBO left on for 60 minutes. OT returned to remove SAEBO with skin intact and no adverse reactions.  [place] Stim One 330 pulse width 35 Hz pulse rate On 8 sec/ off 8 sec Ramp up/ down 2 sec Symmetrical Biphasic wave form  Max intensity 128m at 500 Ohm load  Pt left seated in TIS with daughters present, alarm belt on, and needs met.   Session 2 Pt greeted seated in TIS wc and agreeable to OT treatment session. Pt brought down to therapy gym and  worked on sit<>stands from outside of paralell bar. Pt completed 3 sit<>stands with max +2 and R knee block. Used mirror feedback and facilitation at hips and trunk to work on full extension. OT also facilitated weight bearing through R UE while standing. Pt tolerated standing for 3, 2 minute bouts. OT educated on self ROM exercises with pt needed OT assist to position hands, then pt able to perform elbow flex/ext. Pt returned to room and left seated in TIS wc with alarm belt on, call bell in reach, and daughters present.   Therapy Documentation Precautions:  Precautions Precautions: Fall Precaution Comments: R hemi; R inattention Restrictions Weight Bearing Restrictions: No Pain:  denies pain   Therapy/Group: Individual Therapy  EValma Cava5/07/2019, 2:21 PM

## 2019-09-16 NOTE — Progress Notes (Signed)
[facility] PHYSICAL MEDICINE & REHABILITATION PROGRESS NOTE   Subjective/Complaints: Pt up with family at bedside. A little uncomfortable over weekend. May have been right arm, shoulder.   ROS: Limited due to cognitive/behavioral   Objective:   No results found. Recent Labs    09/16/19 0543  WBC 8.2  HGB 14.7  HCT 43.2  PLT 76*   Recent Labs    09/16/19 0543  NA 138  K 4.0  CL 107  CO2 24  GLUCOSE 103*  BUN 9  CREATININE 1.08  CALCIUM 8.5*    Intake/Output Summary (Last 24 hours) at 09/16/2019 1039 Last data filed at 09/15/2019 1814 Gross per 24 hour  Intake 300 ml  Output --  Net 300 ml     Physical Exam: Vital Signs Blood pressure 127/60, pulse 62, temperature 98.1 F (36.7 C), resp. rate 16, weight 110.2 kg, SpO2 97 %. Constitutional: No distress . Vital signs reviewed. HEENT: EOMI, oral membranes moist Neck: supple Cardiovascular: RRR without murmur. No JVD    Respiratory/Chest: CTA Bilaterally without wheezes or rales. Normal effort    GI/Abdomen: BS +, non-tender, non-distended Ext: no clubbing, cyanosis, or edema Psych: more engaging.  Psych: Normal mood.  Normal behavior. Musc: No edema in extremities.  No tenderness in extremities. Neurological: Alert Global aphasia. Answers yes/nos. Sometimes offers appropriate response to basic questions. Very dysarthric. Would not initiate much with right arm today Not following commands.  Assessment/Plan: 1. Functional deficits secondary to left BG ICH which require 3+ hours per day of interdisciplinary therapy in a comprehensive inpatient rehab setting.  Physiatrist is providing close team supervision and 24 hour management of active medical problems listed below.  Physiatrist and rehab team continue to assess barriers to discharge/monitor patient progress toward functional and medical goals  Care Tool:  Bathing        Body parts bathed by helper: Front perineal area, Buttocks     Bathing assist  Assist Level: 2 Helpers     Upper Body Dressing/Undressing Upper body dressing Upper body dressing/undressing activity did not occur (including orthotics): N/A What is the patient wearing?: Hospital gown only    Upper body assist Assist Level: Dependent - Patient 0%    Lower Body Dressing/Undressing Lower body dressing      What is the patient wearing?: Incontinence brief, Pants     Lower body assist Assist for lower body dressing: Dependent - Patient 0%     Toileting Toileting    Toileting assist Assist for toileting: 2 Helpers     Transfers Chair/bed transfer  Transfers assist  Chair/bed transfer activity did not occur: Safety/medical concerns  Chair/bed transfer assist level: 2 Helpers     Locomotion Ambulation   Ambulation assist   Ambulation activity did not occur: Safety/medical concerns          Walk 10 feet activity   Assist  Walk 10 feet activity did not occur: Safety/medical concerns        Walk 50 feet activity   Assist Walk 50 feet with 2 turns activity did not occur: Safety/medical concerns         Walk 150 feet activity   Assist Walk 150 feet activity did not occur: Safety/medical concerns         Walk 10 feet on uneven surface  activity   Assist Walk 10 feet on uneven surfaces activity did not occur: Safety/medical concerns         Wheelchair     Assist Will patient  use wheelchair at discharge?: Yes Type of Wheelchair: Manual Wheelchair activity did not occur: Safety/medical concerns(lethargy; TIS would be dependent)         Wheelchair 50 feet with 2 turns activity    Assist            Wheelchair 150 feet activity     Assist          Blood pressure 127/60, pulse 62, temperature 98.1 F (36.7 C), resp. rate 16, weight 110.2 kg, SpO2 97 %.  Medical Problem List and Plan: 1.Functional, mobility and cognitive deficitssecondary to left basal ganglia hemorrhage  -recent CT with sl  increase in hematoma size/surrounding edema  Continue CIR PT, OT, SLP 2. Antithrombotics: -DVT/anticoagulation:Mechanical:Sequential compression devices, below kneeBilateral lower extremities--platelets stable to increased 90k -antiplatelet therapy: N/A 3.Chronic LBP/Pain Management:Tylenol prn. 4. Mood:LCSW to follow for evaluation and support. -antipsychotic agents: N/A 5. Neuropsych: This patientis notcapable of making decisions on hisown behalf. 6. Skin/Wound Care:Routine pressure relief measures. 7. Fluids/Electrolytes/Nutrition:resumed po diet  -continue IV[facility] at [facility] for now  I personally reviewed the patient's labs today.   8. HTN:  SBP goal<140--added prn catapres  controlled 9.H/oCOPD:Stable without meds. 10. Impaired glucose tolerance:CBG's wnl so far.  - SSI as indicated.  Controlled on 5/3 11. CAD/AVR:Off ASA due to bleed. No statin due to intolerance. Tenormin, cozaar and lasix (used prn) on hold.  -no TEE indicated 12. Thrombocytopenia:   Platelets 90 on 4/29---> 76k today---continue to follow 13. AKI: Creatinine 1.03 today -continue IVF until diet upgraded 14.  Post stroke dysphagia:               -aspiration precautions  -D1 honey resumed by SLP on 4/29  Advance diet as tolerated  Continue HS IVF for now 15. Constipation: Senna at bedtime   16. Low grade temp/lethargy: Resolved  Zosyn DC'd  Chest x-ray was unremarkable for infection  Wbc wnl LOS: 7 days A FACE TO FACE EVALUATION WAS PERFORMED  Meredith Staggers 09/16/2019, 10:39 AM

## 2019-09-16 NOTE — Progress Notes (Signed)
Speech Language Pathology Daily Session Note  Patient Details  Name: David [name] MRN: [id number] Date of Birth: [birth year]  Today's Date: 09/16/2019 SLP Individual Time: 0815-0900 SLP Individual Time Calculation (min): 45 min  Short Term Goals: Week 1: SLP Short Term Goal 1 (Week 1): Patient will utilize speech intelligibility strategies at the phrase level to achieve ~50% intelligibility with Max verbal cues. SLP Short Term Goal 2 (Week 1): Patient will verbalize wants/needs at the phrase level with Max verbal cues. SLP Short Term Goal 3 (Week 1): Patient will follow 2 step commands with 50% accuracy and Max verbal and visual cues. SLP Short Term Goal 4 (Week 1): Patient will demonstrate sustained attention to functional tasks for 5 minutes with Mod verbal cues for redirection. SLP Short Term Goal 5 (Week 1): Patient will demonstrate orientation to place, time and situation with Max A multimodal cues. SLP Short Term Goal 6 (Week 1): Patient will conusme current diet with minimal overt s/s of aspiration with Mod verbal cues for use of swallowing compensatory strategies.  Skilled Therapeutic Interventions: Skilled treatment session focused on dysphagia goals. Upon arrival, patient's daughters were present and Narda Rutherford requested to be signed off for meals. SLP facilitated session by providing education in regards to patient's current diet recommendations, appropriate textures, appropriate positioning for PO intake, swallowing compensatory strategies, appropriate cueing and how to thicken liquids. Patient's daughter verbalized and demonstrated understanding, therefore, she was signed off to provide supervision with meals and thicken liquids. Patient consumed his breakfast meal of Dys. 1 textures with honey-thick liquids with overt cough X 1, suspect due to delayed oral transit. It appeared patient required more time to transit bolus and initiate a swallow, suspect due to fatigue. Recommend patient  continue current diet. Patient left upright in bed with alarm on an all needs within reach. Continue with current plan of care.      Pain No/Denies Pain   Therapy/Group: Individual Therapy  Shabria Egley 09/16/2019, 2:53 PM

## 2019-09-16 NOTE — Plan of Care (Signed)
  Problem: Consults Goal: RH GENERAL PATIENT EDUCATION Description: See Patient Education module for education specifics. Outcome: Progressing Goal: Skin Care Protocol Initiated - if Braden Score 18 or less Description: Patients skin will be free from break down Outcome: Progressing Goal: Nutrition Consult-if indicated Outcome: Progressing Goal: Diabetes Guidelines if Diabetic/Glucose > 140 Description: If diabetic or lab glucose is > 140 mg/dl - Initiate Diabetes/Hyperglycemia Guidelines & Document Interventions  Outcome: Progressing   Problem: RH BOWEL ELIMINATION Goal: RH STG MANAGE BOWEL WITH ASSISTANCE Description: STG Manage Bowel with mod I Assistance. Outcome: Progressing Goal: RH STG MANAGE BOWEL W/MEDICATION W/ASSISTANCE Description: STG Manage Bowel with Medication with mod I Assistance. Outcome: Progressing   Problem: RH BLADDER ELIMINATION Goal: RH STG MANAGE BLADDER WITH ASSISTANCE Description: STG Manage Bladder With mod I Assistance Outcome: Progressing   Problem: RH SKIN INTEGRITY Goal: RH STG SKIN FREE OF INFECTION/BREAKDOWN Description: Patient will be free from skin break down Outcome: Progressing Goal: RH STG MAINTAIN SKIN INTEGRITY WITH ASSISTANCE Description: STG Maintain Skin Integrity With mod I Assistance. Outcome: Progressing Goal: RH STG ABLE TO PERFORM INCISION/WOUND CARE W/ASSISTANCE Description: STG Able To Perform Incision/Wound Care With mod I Assistance. Outcome: Progressing   Problem: RH SAFETY Goal: RH STG ADHERE TO SAFETY PRECAUTIONS W/ASSISTANCE/DEVICE Description: STG Adhere to Safety Precautions With Assistance/Device. Outcome: Progressing   Problem: RH PAIN MANAGEMENT Goal: RH STG PAIN MANAGED AT OR BELOW PT'S PAIN GOAL Description: Pain scale <3/10 Outcome: Progressing   Problem: RH KNOWLEDGE DEFICIT GENERAL Goal: RH STG INCREASE KNOWLEDGE OF SELF CARE AFTER HOSPITALIZATION Outcome: Progressing   Problem: Consults Goal: RH  STROKE PATIENT EDUCATION Description: See Patient Education module for education specifics  Outcome: Progressing   Problem: RH KNOWLEDGE DEFICIT Goal: RH STG INCREASE KNOWLEDGE OF HYPERTENSION Description: Patient will be able to verbalize anti-hypertensive medication & side effects by discharge Outcome: Progressing Goal: RH STG INCREASE KNOWLEDGE OF DYSPHAGIA/FLUID INTAKE Description: Patient will be able to verbalize need for specialty diet Outcome: Progressing Goal: RH STG INCREASE KNOWLEDGE OF STROKE PROPHYLAXIS Description: Patient will be able to verbalize medications that are for stroke prophylaxis Outcome: Progressing   Problem: Consults Goal: RH STROKE PATIENT EDUCATION Description: See Patient Education module for education specifics  Outcome: Progressing

## 2019-09-17 ENCOUNTER — Inpatient Hospital Stay (HOSPITAL_COMMUNITY): Payer: Medicare Other

## 2019-09-17 ENCOUNTER — Inpatient Hospital Stay (HOSPITAL_COMMUNITY): Payer: Medicare Other | Admitting: Occupational Therapy

## 2019-09-17 ENCOUNTER — Inpatient Hospital Stay (HOSPITAL_COMMUNITY): Payer: Medicare Other | Admitting: Speech Pathology

## 2019-09-17 LAB — GLUCOSE, CAPILLARY
Glucose-Capillary: 91 mg/dL (ref 70–99)
Glucose-Capillary: 112 mg/dL — ABNORMAL HIGH (ref 70–99)
Glucose-Capillary: 97 mg/dL (ref 70–99)
Glucose-Capillary: 113 mg/dL — ABNORMAL HIGH (ref 70–99)

## 2019-09-17 MED ORDER — AMANTADINE HCL 100 MG PO CAPS
100.0000 mg | ORAL_CAPSULE | Freq: Every day | ORAL | Status: DC
  Administered 2019-09-17 – 2019-09-20 (×4): 100 mg via ORAL
  Filled 2019-09-17 (×4): qty 1

## 2019-09-17 NOTE — Progress Notes (Signed)
Nutrition Follow-up  DOCUMENTATION CODES:   Obesity unspecified  INTERVENTION:   - Snacks TID between meals  - Magic Cup TID with meals, each supplement provides 290 kcal and 9 grams of protein  - Encourage adequate PO intake and provide feeding assistance  - Added food preferences to [employer] diet software  - Honey thick sweet tea TID with meals per family's request  NUTRITION DIAGNOSIS:   Inadequate oral intake related to dysphagia, lethargy/confusion as evidenced by meal completion < 25%.  Progressing  GOAL:   Patient will meet greater than or equal to 90% of their needs  Progressing  MONITOR:   Supplement acceptance, Diet advancement, Labs, PO intake, Weight trends  REASON FOR ASSESSMENT:   Malnutrition Screening Tool    ASSESSMENT:   86 year old male with PMH of CAD, COPD, LBP, tremors, AVR. Pt was admitted on 09/04/19 with right facial weakness, dysarthria, right hemiparesis, and left gaze preference. CT head done showing acute left ICH. 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 Pt with resultant dysphagia, aphasia, delayed processing. Pt admitted to CIR on 4/26.  Spoke with pt's daughters. Pt resting at time of RD visit. Pt's daughter Narda Rutherford reports pt is doing better at meals and is more alert. Narda Rutherford has been signed off for feeding assistance. Narda Rutherford reports pt enjoys the [employer] that come with his meals and inquires about thickened chocolate milk.  Narda Rutherford reports that pt is not typically a big eater a meals and instead snacks throughout the day. RD to order snacks TID between meals to aid in increasing PO intake.  Pt with mild pitting generalized edema and mild pitting edema to BUE.  Meal Completion: 5-50% x last 8 meals  Medications reviewed and include: SSI, protonix, senna IVF: NS with KCl q HS  Labs reviewed. CBG's: 91-115 x 24  hours  Diet Order:   Diet Order            DIET - DYS 1 Room service appropriate? Yes; Fluid consistency: Honey Thick  Diet effective now              EDUCATION NEEDS:   No education needs have been identified at this time  Skin:  Skin Assessment: Reviewed RN Assessment  Last BM:  09/09/19  Height:   Ht Readings from Last 1 Encounters:  09/04/19 5\' 11"  (1.803 m)    Weight:   Wt Readings from Last 1 Encounters:  09/15/19 110.2 kg    Ideal Body Weight:  78.2 kg  BMI:  Body mass index is 33.88 kg/m.  Estimated Nutritional Needs:   Kcal:  1800-2000  Protein:  95-110 grams  Fluid:  1.8-2.0 L    Gaynell Face, MS, RD, LDN Inpatient Clinical Dietitian Pager: [phone] Weekend/After Hours: [phone]

## 2019-09-17 NOTE — Plan of Care (Signed)
  Problem: RH Swallowing Goal: LTG Patient will consume least restrictive diet using compensatory strategies with assistance (SLP) Description: LTG:  Patient will consume least restrictive diet using compensatory strategies with assistance (SLP) Flowsheets (Taken 09/17/2019 0655) LTG: Pt Patient will consume least restrictive diet using compensatory strategies with assistance of (SLP): Supervision Note: Added due to entry error Goal: LTG Pt will demonstrate functional change in swallow as evidenced by bedside/clinical objective assessment (SLP) Description: LTG: Patient will demonstrate functional change in swallow as evidenced by bedside/clinical objective assessment (SLP) Flowsheets (Taken 09/17/2019 0655) LTG: Patient will demonstrate functional change in swallow as evidenced by bedside/clinical objective assessment: Oropharyngeal swallow Note: Added due to entry error

## 2019-09-17 NOTE — Plan of Care (Signed)
  Problem: Consults Goal: RH GENERAL PATIENT EDUCATION Description: See Patient Education module for education specifics. Outcome: Progressing Goal: Skin Care Protocol Initiated - if Braden Score 18 or less Description: Patients skin will be free from break down Outcome: Progressing Goal: Nutrition Consult-if indicated Outcome: Progressing Goal: Diabetes Guidelines if Diabetic/Glucose > 140 Description: If diabetic or lab glucose is > 140 mg/dl - Initiate Diabetes/Hyperglycemia Guidelines & Document Interventions  Outcome: Progressing   Problem: RH BOWEL ELIMINATION Goal: RH STG MANAGE BOWEL WITH ASSISTANCE Description: STG Manage Bowel with mod I Assistance. Outcome: Progressing Goal: RH STG MANAGE BOWEL W/MEDICATION W/ASSISTANCE Description: STG Manage Bowel with Medication with mod I Assistance. Outcome: Progressing   Problem: RH BLADDER ELIMINATION Goal: RH STG MANAGE BLADDER WITH ASSISTANCE Description: STG Manage Bladder With mod I Assistance Outcome: Progressing   Problem: RH SKIN INTEGRITY Goal: RH STG SKIN FREE OF INFECTION/BREAKDOWN Description: Patient will be free from skin break down Outcome: Progressing Goal: RH STG MAINTAIN SKIN INTEGRITY WITH ASSISTANCE Description: STG Maintain Skin Integrity With mod I Assistance. Outcome: Progressing Goal: RH STG ABLE TO PERFORM INCISION/WOUND CARE W/ASSISTANCE Description: STG Able To Perform Incision/Wound Care With mod I Assistance. Outcome: Progressing   Problem: RH SAFETY Goal: RH STG ADHERE TO SAFETY PRECAUTIONS W/ASSISTANCE/DEVICE Description: STG Adhere to Safety Precautions With Assistance/Device. Outcome: Progressing   Problem: RH PAIN MANAGEMENT Goal: RH STG PAIN MANAGED AT OR BELOW PT'S PAIN GOAL Description: Pain scale <3/10 Outcome: Progressing   Problem: RH KNOWLEDGE DEFICIT GENERAL Goal: RH STG INCREASE KNOWLEDGE OF SELF CARE AFTER HOSPITALIZATION Outcome: Progressing   Problem: Consults Goal: RH  STROKE PATIENT EDUCATION Description: See Patient Education module for education specifics  Outcome: Progressing   Problem: RH KNOWLEDGE DEFICIT Goal: RH STG INCREASE KNOWLEDGE OF HYPERTENSION Description: Patient will be able to verbalize anti-hypertensive medication & side effects by discharge Outcome: Progressing Goal: RH STG INCREASE KNOWLEDGE OF DYSPHAGIA/FLUID INTAKE Description: Patient will be able to verbalize need for specialty diet Outcome: Progressing Goal: RH STG INCREASE KNOWLEDGE OF STROKE PROPHYLAXIS Description: Patient will be able to verbalize medications that are for stroke prophylaxis Outcome: Progressing   Problem: Consults Goal: RH STROKE PATIENT EDUCATION Description: See Patient Education module for education specifics  Outcome: Progressing

## 2019-09-17 NOTE — Patient Care Conference (Signed)
Inpatient RehabilitationTeam Conference and Plan of Care Update Date: 09/17/2019   Time: 10:35 AM    Patient Name: David [name]      Medical Record Number: [id number]  Date of Birth: [birth year] Sex: Male         Room/Bed: 4W06C/4W06C-01 Payor Info: Payor: MEDICARE / Plan: MEDICARE PART A AND B / Product Type: *No Product type* /    Admit Date/Time:  09/09/2019  9:40 PM  Primary Diagnosis:  ICH (intracerebral hemorrhage) ([place])  Patient Active Problem List   Diagnosis Date Noted  . Labile blood pressure   . Dysphagia, post-stroke   . AKI (acute kidney injury) ([place])   . Labile blood glucose   . Coronary artery disease involving native coronary artery of native heart without angina pectoris   . Chronic obstructive pulmonary disease ([place])   . Tremors of nervous system   . S/P AVR   . Thrombocytopenia ([place])   . Global aphasia   . ICH (intracerebral hemorrhage) ([place]) 09/04/2019  . Tremor 05/20/2019  . 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 Chest pain, atypical 09/20/2018  . Onychomycosis 06/06/2018  . Acute pain of left knee 08/18/2017  . Wart 06/05/2017  . Acute upper respiratory infection 05/12/2016  . Skin irritation from shaving 12/02/2015  . Benign paroxysmal positional vertigo 03/09/2015  . Plantar fasciitis, bilateral 12/02/2014  . Constipation 07/17/2014  . Cough 04/09/2014  . S/P AVR (aortic valve replacement) 11/04/2013  . Elevated hemoglobin ([place]) 12/14/2012  . Well adult exam 11/12/2012  . 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 Impaired glucose tolerance 02/14/2011  . Edema 10/22/2010  . Actinic keratoses 10/22/2010  . THROMBOCYTOPENIA 01/22/2010  . MELANOMA 03/05/2009  . TOBACCO USE, QUIT 02/27/2009  . Neoplasm of uncertain behavior of skin 05/08/2007  . Dyslipidemia 02/08/2007  . Essential hypertension 02/08/2007  . Coronary atherosclerosis 02/08/2007  . COPD 02/08/2007  . GERD 02/08/2007  . OSTEOARTHRITIS 02/08/2007  . LOW BACK PAIN 02/08/2007     Expected Discharge Date: Expected Discharge Date: 10/08/19  Team Members Present: Physician leading conference: Dr. Alger Simons Care Coodinator Present: Erlene Quan, BSW;Genie Alyria Krack, RN, MSN Nurse Present: Rayne Du, LPN PT Present: Apolinar Junes, PT OT Present: Cherylynn Ridges, OT SLP Present: Weston Anna, SLP PPS Coordinator present : Ileana Ladd, Burna Mortimer, SLP     Current Status/Progress Goal Weekly Team Focus  Bowel/Bladder   Incontinent of bowel/bladder  Help become more continent  Assess bathroom need q2 or as needed.   Swallow/Nutrition/ Hydration   Dys. 1 textues with honey-thick liquids, Supervision-Min A  Supervision  tolerance of current diet, use of swallowing compensatory strategies   ADL's   Max A +2 overall  Min/mod A overall  self-care retraining, attention, initiation, visual scanning, sit<>stands, trasnfers, R UE NMR, NMES   Mobility   Max A bed mobility, max +2 transfers, standing tolerance 2 min  Min assist transfers; supervision w/c 150 ft; mod car and gait 20 ft with LRAD  Functional mobility, midline orientation, R attention, R UE and LE NMR, strengthening/ROM, w/c mobility, standing tolerance with pre-gait activities, patient/caregiver education   Communication   Mod-Max A  Min A  word-finding, use of speech intelligibility strategies   Safety/Cognition/ Behavioral Observations  Mod-Max A  Min A  basic problem solving, attention   Pain   No signs of distress or pain  To remain pain free through stay  Assess pain q shift or prn   Skin   Has MASD on sacrum, ecchymosis on  right and left arm, and rash like area on back; Gets medicated ointment for back and barrier cream for sacrum. Also has abrasion like area to right upper arm.  To prevent skin from breaking down any further  q2 turns; and assessing skin q shift or prn    Rehab Goals Patient on target to meet rehab goals: Yes Rehab Goals Revised: On target with current  goals *See Care Plan and progress notes for long and short-term goals.     Barriers to Discharge  Current Status/Progress Possible Resolutions Date Resolved   Nursing                  PT  Lack of/limited family support;Behavior;Incontinence  Patient's daughter to provide assist at d/c, can only provide min A, patient with intermittent lethargy and confusion, epressive aphasia, decreased midline orientation, R side inattention, and increased tone in R UE and LE              OT                  SLP                SW Lack of/limited family support;Medical stability Daughter avaliable to provide 24 hr care at min assit level On target          Discharge Planning/Teaching Needs:  Daughter plans to discharge home to provide care  Will schedule education if reccommended.   Team Discussion: MD 86 yo L BG hemorrhage, aphasia, R hemi, PNA, treated aggressively, no fever, may start low dose stimulant, Dtr in daily, IV fluids at night.  RN inc B/B, BM yesterday, bottom red, applied barrier cream.  OT max +2 stand.  PT +2 max A lat scoot transfers, R ankle tight, may benefit from a PRAFO boot.  SLP MBS done, upgrade diet today.   Revisions to Treatment Plan: SLP upgraded diet today.  Keep on 15/7 therapy schedule.  DC date determined to be 5/25     Medical Summary Current Status: left BG ICH, slow progress with wax and wane, d1/honeys---IVF at [facility]. aphasic,dysarthric, right hemiparesis, hx of cad/AS Weekly Focus/Goal: improve nutrition, arousal, pain mgt, bp control  Barriers to Discharge: Medical stability   Possible Resolutions to Barriers: see detailed medical progress note from today   Continued Need for Acute Rehabilitation Level of Care: The patient requires daily medical management by a physician with specialized training in physical medicine and rehabilitation for the following reasons: Direction of a multidisciplinary physical rehabilitation program to maximize functional independence :  Yes Medical management of patient stability for increased activity during participation in an intensive rehabilitation regime.: Yes Analysis of laboratory values and/or radiology reports with any subsequent need for medication adjustment and/or medical intervention. : Yes   I attest that I was present, lead the team conference, and concur with the assessment and plan of the team.   Retta Diones 09/17/2019, 7:43 PM   Team conference was held via web/ teleconference due to [place] - 19

## 2019-09-17 NOTE — Progress Notes (Signed)
[place] PHYSICAL MEDICINE & REHABILITATION PROGRESS NOTE   Subjective/Complaints: OT at bedside about to get him out of bed. Was active and more verbal this morning. Not so much when I was in. Asked him where he hurt and he responded "everywhere".   ROS: Limited due to cognitive/behavioral   Objective:   No results found. Recent Labs    09/16/19 0543  WBC 8.2  HGB 14.7  HCT 43.2  PLT 76*   Recent Labs    09/16/19 0543  NA 138  K 4.0  CL 107  CO2 24  GLUCOSE 103*  BUN 9  CREATININE 1.08  CALCIUM 8.5*    Intake/Output Summary (Last 24 hours) at 09/17/2019 1208 Last data filed at 09/17/2019 [id number] Gross per 24 hour  Intake 430 ml  Output --  Net 430 ml     Physical Exam: Vital Signs Blood pressure (!) 155/91, pulse 71, temperature 98.4 F (36.9 C), temperature source Oral, resp. rate 18, weight 110.2 kg, SpO2 96 %. Constitutional: No distress . Vital signs reviewed. HEENT: EOMI, oral membranes moist Neck: supple Cardiovascular: IRR without murmur. No JVD    Respiratory/Chest: CTA Bilaterally without wheezes or rales. Normal effort    GI/Abdomen: BS +, non-tender, non-distended Ext: no clubbing, cyanosis, or edema Psych: flat an more disengaged today Musc: No edema in extremities.  No tenderness in extremities. Neurological: Alert Global aphasia. Answers yes/no's on occasion. Sometimes offers appropriate response to basic questions. Remains very dysarthric. Doesn't initiate with rightarm Skin: numerous bruises/lacs on arms, fragile areas covered. Small area redness near sacrum/buttocks    Assessment/Plan: 1. Functional deficits secondary to left BG ICH which require 3+ hours per day of interdisciplinary therapy in a comprehensive inpatient rehab setting.  Physiatrist is providing close team supervision and 24 hour management of active medical problems listed below.  Physiatrist and rehab team continue to assess barriers to discharge/monitor patient progress  toward functional and medical goals  Care Tool:  Bathing        Body parts bathed by helper: Front perineal area, Buttocks     Bathing assist Assist Level: 2 Helpers     Upper Body Dressing/Undressing Upper body dressing Upper body dressing/undressing activity did not occur (including orthotics): N/A What is the patient wearing?: Hospital gown only    Upper body assist Assist Level: Dependent - Patient 0%    Lower Body Dressing/Undressing Lower body dressing      What is the patient wearing?: Incontinence brief, Pants     Lower body assist Assist for lower body dressing: Dependent - Patient 0%     Toileting Toileting    Toileting assist Assist for toileting: 2 Helpers     Transfers Chair/bed transfer  Transfers assist  Chair/bed transfer activity did not occur: Safety/medical concerns  Chair/bed transfer assist level: 2 Helpers     Locomotion Ambulation   Ambulation assist   Ambulation activity did not occur: Safety/medical concerns          Walk 10 feet activity   Assist  Walk 10 feet activity did not occur: Safety/medical concerns        Walk 50 feet activity   Assist Walk 50 feet with 2 turns activity did not occur: Safety/medical concerns         Walk 150 feet activity   Assist Walk 150 feet activity did not occur: Safety/medical concerns         Walk 10 feet on uneven surface  activity   Assist Walk  10 feet on uneven surfaces activity did not occur: Safety/medical concerns         Wheelchair     Assist Will patient use wheelchair at discharge?: Yes Type of Wheelchair: Manual Wheelchair activity did not occur: Safety/medical concerns(lethargy; TIS would be dependent)         Wheelchair 50 feet with 2 turns activity    Assist            Wheelchair 150 feet activity     Assist          Blood pressure (!) 155/91, pulse 71, temperature 98.4 F (36.9 C), temperature source Oral, resp. rate  18, weight 110.2 kg, SpO2 96 %.  Medical Problem List and Plan: 1.Functional, mobility and cognitive deficitssecondary to left basal ganglia hemorrhage  -recent CT with sl increase in hematoma size/surrounding edema  Continue CIR PT, OT, SLP   -therapies 24/7, arousal still can wax and wane 2. Antithrombotics: -DVT/anticoagulation:Mechanical:Sequential compression devices, below kneeBilateral lower extremities--platelets stable to increased 90k -antiplatelet therapy: N/A 3.Chronic LBP/Pain Management:Tylenol prn. 4. Mood:LCSW to follow for evaluation and support.asked daughter if she felt he was depressed, and she wasn't sure -antipsychotic agents: N/A 5. Neuropsych: This patientis notcapable of making decisions on hisown behalf.  -will initiate low dose amantadine to assist with day time arousal. Start at 100mg  daily. Will give first dose now 6. Skin/Wound Care:Routine pressure relief measures. 7. Fluids/Electrolytes/Nutrition:resumed po diet  -[facility] at [facility] for now   .   8. HTN:  SBP goal<140--added prn catapres  BP controlled until am 5/4 reading--no changes today 9.H/oCOPD:Stable without meds. 10. Impaired glucose tolerance:CBG's wnl so far.  - SSI as indicated.  Controlled on 5/4 11. CAD/AVR:Off ASA due to bleed. No statin due to intolerance. Tenormin, cozaar and lasix (used prn) on hold.  -no TEE indicated 12. Thrombocytopenia:   Platelets 90 on 4/29---> 76k  ---continue to follow 13. AKI: Creatinine 1.03  -continue IVF until diet upgraded 14.  Post stroke dysphagia:               -aspiration precautions  -D1 honey resumed by SLP on 4/29  Advance diet as tolerated  Continue HS IVF for now 15. Constipation: Senna at bedtime   16. Low grade temp/lethargy: Resolved  Zosyn DC'd  Chest x-ray was unremarkable for infection  Wbc wnl LOS: 8 days A FACE TO FACE EVALUATION WAS PERFORMED  Meredith Staggers 09/17/2019, 12:08 PM

## 2019-09-17 NOTE — Progress Notes (Signed)
Occupational Therapy Session Note  Patient Details  Name: David [name] MRN: [id number] Date of Birth: Dec 07, 1923  Today's Date: 09/17/2019 OT Individual Time: [id number] OT Individual Time Calculation (min): 43 min    Short Term Goals: Week 2:  OT Short Term Goal 1 (Week 2): Pt will transfer with assist of 1 person. OT Short Term Goal 2 (Week 2): Pt will maintain sitting balance with no more than mod A within BADL tasks OT Short Term Goal 3 (Week 2): Pt will demonstrate self-ROM of L UE OT Short Term Goal 4 (Week 2): Pt will locate 2 items on L side of the sink with mod questioning cues  Skilled Therapeutic Interventions/Progress Updates:    Pt greeted semi-reclined in bed with daughters present. Pt grimacing and daughters thought pt may be having a BM. Pt had eyes closed but language was more clear today. OT asked pt if he wanted to get to [facility] and he said " I can't get over there, I can;t move." OT educated on transfer options including Stedy. Pt noted to have already had bowel incontinence. Max+2 for rolling in bed for total A peri-care and brief change. When pt rolled on one side, he had incontinent void of urine on the bed before clean brief was donned, requiring max A +2 again for rolling to apply another brief. Pt came to sitting EOB with max A +2. Worked on sitting balance while donning clothes. Min A for static sitting, progressing to max A with increased task demand of donning shirt. Worked on increased attention to R UE by having pt grasp R hand with L, with pt needing max cues and hand over hand A. Pt needed max A to don shirt and max+2 for pants. Sit<>stand in [place] with max A +2, but improved power up. Pt transferred to [place] using Stedy. OT issued elbow protector and donned on R UE. OT placed SAEBO e-stim to shoulder. SAEBO left on for 60 minutes. OT returned to remove SAEBO with skin intact and no adverse reactions.  [place] Stim One 330 pulse width 35 Hz pulse rate On 8 sec/ off 8  sec Ramp up/ down 2 sec Symmetrical Biphasic wave form  Max intensity 183mA at 500 Ohm load  Pt left tilted in TIS with daughters present, alarm belt on, and call bell in reach.   Therapy Documentation Precautions:  Precautions Precautions: Fall Precaution Comments: R hemi; R inattention Restrictions Weight Bearing Restrictions: No Pain: Pain Assessment Pain Scale: 0-10 Pain Score: 0-No pain   Therapy/Group: Individual Therapy  Valma Cava 09/17/2019, 9:51 AM

## 2019-09-17 NOTE — Progress Notes (Signed)
Orthopedic Tech Progress Note Patient Details:  [name] Sep 08, 1923 [id number] Called in order to [employer] for a PRAFO BOOT Patient ID: David [name], male   DOB: 1923-10-19, 86 y.o.   MRN: [id number]   Janit Pagan 09/17/2019, 1:50 PM

## 2019-09-17 NOTE — Progress Notes (Signed)
Physical Therapy Weekly Progress Note  Patient Details  Name: David [name] MRN: [id number] Date of Birth: March 02, 1924  Beginning of progress report period: September 10, 2019 End of progress report period: Sep 17, 2019  Today's Date: 09/17/2019 PT Individual Time: 1345-1455 PT Individual Time Calculation (min): 70 min   Patient has met 2 of 3 short term goals.  He has been limited by lethargy during some sessions this week, but is making progress towards his goals. He currently requires max A of 1 person for bed mobility, max-mod A +2 for lateral scoot transfers and mod-min A +2 sit to/from stand with pulling up on a parallel bar face front and the Stedy, and has initiated standing tolerance up to 2 min. Have initiated education with his daughter for positioning in bed, donning doffing hand splint and PRAFO, and some gentle PROM stretching to reduce R UE and LE tone.  Patient continues to demonstrate the following deficits muscle weakness and muscle joint tightness, decreased cardiorespiratoy endurance, abnormal tone, decreased coordination and decreased motor planning, decreased midline orientation, decreased attention to right and decreased motor planning, decreased awareness, decreased problem solving, decreased safety awareness and decreased memory and decreased sitting balance, decreased standing balance, decreased postural control, hemiplegia and decreased balance strategies and therefore will continue to benefit from skilled PT intervention to increase functional independence with mobility.  Patient progressing toward long term goals..  Continue plan of care.  PT Short Term Goals Week 1:  PT Short Term Goal 1 (Week 1): Pt will be able to perform bed mobility with mod assist PT Short Term Goal 1 - Progress (Week 1): Progressing toward goal PT Short Term Goal 2 (Week 1): Pt will be able to initiate transfers with max +2 PT Short Term Goal 2 - Progress (Week 1): Met PT Short Term Goal 3 (Week  1): Pt will be able to reorient to midline seated EOB with mod assist PT Short Term Goal 3 - Progress (Week 1): Met Week 2:  PT Short Term Goal 1 (Week 2): Patient will perfrom bed mobility with mod A consistently. PT Short Term Goal 2 (Week 2): Patient will perform transfers with mod A +2. PT Short Term Goal 3 (Week 2): Patient will perform sit to stand with mod A +2. PT Short Term Goal 4 (Week 2): Patient will initiate pre-gait activities.  Skilled Therapeutic Interventions/Progress Updates:     Patient in TIS w/c with his daughter in the room upon PT arrival. Patient alert and agreeable to PT session. Patient denied pain and did not shows signs of distress during session. Patient was initially lethargic in the TIS w/c, then became more alert once initiating mobility.  Therapeutic Activity: Bed Mobility: Patient performed sit to supine with mod-max A +2 due to fatiue. Provided verbal cues for brining LEs up as he lowered his trunk.  Transfers: Patient performed lateral scoot transfers w/c<>mat table with mod-max A +2 blocking R knee throughout. Provided cues for hand placement and head-hips relationship for proper technique and decreased assist with transfers. He performed sit to/from stand x2 with min-mod A +2 using one parallel bar facing forward and sit to stand x1 in the [place] with mod-max of 1 person and second person to assist with transfer of Stedy to the bed. Provided verbal cues for brining trunk forward, pulling with his L UE to promote forward weight shift, and pushing through B LEs to come to standing.  Wheelchair Mobility:  Patient was transported in the [place] w/c  with total A throughout session for energy conservation and time management.  Neuromuscular Re-ed: Patient performed the following sitting balance and standing balance activities: -sitting balance with mirror in front for midline orientation with min A progressing to supervision seated on a mat table x2 min -sitting  reaching with L UE to grab different colored clothespins within full visual field, started by reaching inside BOS and progressed to slightly outside of BOS, then progressed to selecting one of two colors on R and L when reaching. Required supervision for sitting balance with min-mod A x1 due to posterior LOB, hand over hand assist to initiate activity then consistent cuing. Patient took increased time to identify object in R visual field, but was able to find them consistently and select the correct color each time. -standing balance with B UEs on a parallel bar facing forward toward the bar with approximation at patient's hand and wrist for increased input and weight bearing into R UE 2x2-3 min. Provided manual facilitation for cervical extension, thoracic extension, R knee extension, hip extension, and equal weight bearing through B LEs throughout.   Educated patient's daughters on donning/doffing and wearing schedule of PRAFO for his R LE that arrived during session. Also educated on use of lateral kick stand to prevent hip ER and elevation with pillows under B LEs. Patient's daughter donned R hand splint without cues for the patient with a pillow under his R arm at end of session.   Patient in bed with his daughters at bedside at end of session with breaks locked, bed alarm set, and all needs within reach.    Therapy Documentation Precautions:  Precautions Precautions: Fall Precaution Comments: R hemi; R inattention Restrictions Weight Bearing Restrictions: No   Therapy/Group: Individual Therapy  Nicanor Mendolia L Ozzie Remmers PT, DPT  09/17/2019, 5:26 PM

## 2019-09-17 NOTE — Progress Notes (Signed)
Social Work Patient ID: David [name], male   DOB: 1923-09-04, 86 y.o.   MRN: [id number]    Sw entered room, daughters at bedside. Patient sitting upright in chair asleep. Daughter reports he was been that way since therapy this morning. SW provided tram conference updates to family. Informed family in MD, nursing and therapy updates. Daughter would like a follow up from MD. Daughter pleased with new d/c date of 5/25. Would just like a heads up in advance if MD and therapy feels like patient will not be able to return home or need placement. SW will follow up with questions/cocnerns.

## 2019-09-17 NOTE — Progress Notes (Signed)
Speech Language Pathology Daily Session Note  Patient Details  Name: David [name] MRN: [id number] Date of Birth: Sep 07, 1923  Today's Date: 09/17/2019 SLP Individual Time: [id number] SLP Individual Time Calculation (min): 45 min  Short Term Goals: Week 1: SLP Short Term Goal 1 (Week 1): Patient will utilize speech intelligibility strategies at the phrase level to achieve ~50% intelligibility with Max verbal cues. SLP Short Term Goal 2 (Week 1): Patient will verbalize wants/needs at the phrase level with Max verbal cues. SLP Short Term Goal 3 (Week 1): Patient will follow 2 step commands with 50% accuracy and Max verbal and visual cues. SLP Short Term Goal 4 (Week 1): Patient will demonstrate sustained attention to functional tasks for 5 minutes with Mod verbal cues for redirection. SLP Short Term Goal 5 (Week 1): Patient will demonstrate orientation to place, time and situation with Max A multimodal cues. SLP Short Term Goal 6 (Week 1): Patient will conusme current diet with minimal overt s/s of aspiration with Mod verbal cues for use of swallowing compensatory strategies.  Skilled Therapeutic Interventions: Skilled treatment session focused on speech and dysphagia goals. Patient appeared more alert and more verbally responsive compared to yesterday's session. SLP facilitated session by providing Max A multimodal cues for orientation despite choices. Mod A verbal cues were also needed to identify family members in a picture and answer basic biographical questions secondary to perseveration. Patient consumed breakfast meal of Dys. 1 textures with honey-thick liquids without overt s/s of aspiration and swallowing initiation appeared more timely today. Patient also demonstrated increased PO intake. Patient reported difficulty with vision in right eye and needing to turn his head to see SLP in right visual field, question possible field cut. Patient left upright in bed with alarm on and all needs within  reach. Continue with current plan of care.      Pain Pain Assessment Pain Scale: 0-10 Pain Score: 0-No pain  Therapy/Group: Individual Therapy  Claudene Gatliff 09/17/2019, 10:05 AM

## 2019-09-18 ENCOUNTER — Inpatient Hospital Stay (HOSPITAL_COMMUNITY): Payer: Medicare Other | Admitting: Occupational Therapy

## 2019-09-18 ENCOUNTER — Inpatient Hospital Stay (HOSPITAL_COMMUNITY): Payer: Medicare Other

## 2019-09-18 ENCOUNTER — Inpatient Hospital Stay (HOSPITAL_COMMUNITY): Payer: Medicare Other | Admitting: Speech Pathology

## 2019-09-18 LAB — GLUCOSE, CAPILLARY
Glucose-Capillary: 103 mg/dL — ABNORMAL HIGH (ref 70–99)
Glucose-Capillary: 104 mg/dL — ABNORMAL HIGH (ref 70–99)
Glucose-Capillary: 115 mg/dL — ABNORMAL HIGH (ref 70–99)
Glucose-Capillary: 116 mg/dL — ABNORMAL HIGH (ref 70–99)

## 2019-09-18 MED ORDER — ATENOLOL 25 MG PO TABS
12.5000 mg | ORAL_TABLET | Freq: Every day | ORAL | Status: DC
  Administered 2019-09-18 – 2019-09-26 (×9): 12.5 mg via ORAL
  Filled 2019-09-18 (×10): qty 1

## 2019-09-18 NOTE — Progress Notes (Signed)
Speech Language Pathology Daily Session Note  Patient Details  Name: David [name] MRN: [id number] Date of Birth: 1923-12-03  Today's Date: 09/18/2019 SLP Individual Time: 1000-1030 SLP Individual Time Calculation (min): 30 min and Today's Date: 09/18/2019 SLP Missed Time: 15 Minutes Missed Time Reason: Nursing care  Short Term Goals: Week 1: SLP Short Term Goal 1 (Week 1): Patient will utilize speech intelligibility strategies at the phrase level to achieve ~50% intelligibility with Max verbal cues. SLP Short Term Goal 2 (Week 1): Patient will verbalize wants/needs at the phrase level with Max verbal cues. SLP Short Term Goal 3 (Week 1): Patient will follow 2 step commands with 50% accuracy and Max verbal and visual cues. SLP Short Term Goal 4 (Week 1): Patient will demonstrate sustained attention to functional tasks for 5 minutes with Mod verbal cues for redirection. SLP Short Term Goal 5 (Week 1): Patient will demonstrate orientation to place, time and situation with Max A multimodal cues. SLP Short Term Goal 6 (Week 1): Patient will conusme current diet with minimal overt s/s of aspiration with Mod verbal cues for use of swallowing compensatory strategies.  Skilled Therapeutic Interventions: Skilled treatment session focused on communication goals. SLP facilitated session by providing Mod-Max A verbal cues for education and utilization of speech intelligibility strategies to achieve ~80% intelligibility at the word level and ~50-75% intelligibility at the phrase and sentence level. Patient named functional items with 80% accuracy and verbalized their overall function with Min-Mod A verbal cues. Patient also read aloud at the word and phrase level with 100% accuracy and supervision verbal cues for attention to task. Patient left upright in bed with alarm on and all needs within reach. Continue with current plan of care.      Pain No/Denies Pain   Therapy/Group: Individual Therapy  Gisele Pack,  Keanen Dohse 09/18/2019, 2:20 PM

## 2019-09-18 NOTE — Progress Notes (Signed)
[place] PHYSICAL MEDICINE & REHABILITATION PROGRESS NOTE   Subjective/Complaints: Pt up in bed. Daughter feeding him juice. Pt seems to be fixated on that nobody's helping him, but it's hard to make out most of what he says  ROS: Limited due to cognitive/behavioral    Objective:   No results found. Recent Labs    09/16/19 0543  WBC 8.2  HGB 14.7  HCT 43.2  PLT 76*   Recent Labs    09/16/19 0543  NA 138  K 4.0  CL 107  CO2 24  GLUCOSE 103*  BUN 9  CREATININE 1.08  CALCIUM 8.5*    Intake/Output Summary (Last 24 hours) at 09/18/2019 [id number] Last data filed at 09/18/2019 0751 Gross per 24 hour  Intake 560 ml  Output --  Net 560 ml     Physical Exam: Vital Signs Blood pressure (!) 169/86, pulse 76, temperature (!) 97.5 F (36.4 C), temperature source Oral, resp. rate 18, weight 110.2 kg, SpO2 96 %. Constitutional: No distress . Vital signs reviewed. HEENT: EOMI, oral membranes moist Neck: supple Cardiovascular: RRR without murmur. No JVD    Respiratory/Chest: CTA Bilaterally without wheezes or rales. Normal effort    GI/Abdomen: BS +, non-tender, non-distended Ext: no clubbing, cyanosis, or edema Psych: a little more disengaged today, perseverates on above Musc: No edema in extremities.  No tenderness in extremities. Neurological: Alert Global aphasia. Very dysarthric.  Doesn't initiate with rightarm. Extensor tone RLE. Skin: numerous bruises/lacs on arms, fragile areas covered. Small area redness near sacrum/buttocks    Assessment/Plan: 1. Functional deficits secondary to left BG ICH which require 3+ hours per day of interdisciplinary therapy in a comprehensive inpatient rehab setting.  Physiatrist is providing close team supervision and 24 hour management of active medical problems listed below.  Physiatrist and rehab team continue to assess barriers to discharge/monitor patient progress toward functional and medical goals  Care Tool:  Bathing         Body parts bathed by helper: Front perineal area, Buttocks     Bathing assist Assist Level: 2 Helpers     Upper Body Dressing/Undressing Upper body dressing Upper body dressing/undressing activity did not occur (including orthotics): N/A What is the patient wearing?: Hospital gown only    Upper body assist Assist Level: Dependent - Patient 0%    Lower Body Dressing/Undressing Lower body dressing      What is the patient wearing?: Incontinence brief, Pants     Lower body assist Assist for lower body dressing: Dependent - Patient 0%     Toileting Toileting    Toileting assist Assist for toileting: 2 Helpers     Transfers Chair/bed transfer  Transfers assist  Chair/bed transfer activity did not occur: Safety/medical concerns  Chair/bed transfer assist level: 2 Helpers     Locomotion Ambulation   Ambulation assist   Ambulation activity did not occur: Safety/medical concerns          Walk 10 feet activity   Assist  Walk 10 feet activity did not occur: Safety/medical concerns        Walk 50 feet activity   Assist Walk 50 feet with 2 turns activity did not occur: Safety/medical concerns         Walk 150 feet activity   Assist Walk 150 feet activity did not occur: Safety/medical concerns         Walk 10 feet on uneven surface  activity   Assist Walk 10 feet on uneven surfaces activity did not  occur: Safety/medical concerns         Wheelchair     Assist Will patient use wheelchair at discharge?: Yes Type of Wheelchair: Manual Wheelchair activity did not occur: Safety/medical concerns(lethargy; TIS would be dependent)         Wheelchair 50 feet with 2 turns activity    Assist            Wheelchair 150 feet activity     Assist          Blood pressure (!) 169/86, pulse 76, temperature (!) 97.5 F (36.4 C), temperature source Oral, resp. rate 18, weight 110.2 kg, SpO2 96 %.  Medical Problem List and  Plan: 1.Functional, mobility and cognitive deficitssecondary to left basal ganglia hemorrhage  -recent CT with sl increase in hematoma size/surrounding edema  Continue CIR PT, OT, SLP   -therapies 24/7, arousal still can wax and wane  -PRAFO/AFO 2. Antithrombotics: -DVT/anticoagulation:Mechanical:Sequential compression devices, below kneeBilateral lower extremities--platelets stable to increased 90k -antiplatelet therapy: N/A 3.Chronic LBP/Pain Management:Tylenol prn. 4. Mood:LCSW to follow for evaluation and support.asked daughter if she felt he was depressed, and she wasn't sure -antipsychotic agents: N/A 5. Neuropsych: This patientis notcapable of making decisions on hisown behalf.  -  low dose amantadine to assist with day time arousal.  100mg  daily.   6. Skin/Wound Care:Routine pressure relief measures. 7. Fluids/Electrolytes/Nutrition:resumed po diet  -continue IVF at [facility] for now   .   8. HTN:  SBP goal<140--added prn catapres  Prn catapres for high readings, more elevated in AM  -resume low dose tenormin 9.H/oCOPD:Stable without meds. 10. Impaired glucose tolerance:CBG's wnl so far.  - SSI as indicated.  Controlled on 5/5 11. CAD/AVR:Off ASA due to bleed. No statin due to intolerance. Tenormin, cozaar and lasix (used prn) on hold.  -no TEE indicated 12. Thrombocytopenia:   Platelets 90 on 4/29---> 76k  ---continue to follow 13. AKI: Creatinine 1.03  -continue IVF until diet upgraded 14.  Post stroke dysphagia:               -aspiration precautions  -D1 honey resumed by SLP on 4/29  Advance diet as tolerated  Continue HS IVF for now 15. Constipation: Senna at bedtime      LOS: 9 days A FACE TO FACE EVALUATION WAS PERFORMED  Meredith Staggers 09/18/2019, 8:32 AM

## 2019-09-18 NOTE — Progress Notes (Signed)
Occupational Therapy Session Note  Patient Details  Name: David [name] MRN: [id number] Date of Birth: 31-Jul-1923  Today's Date: 09/18/2019 OT Individual Time: [id number] OT Individual Time Calculation (min): 53 min    Short Term Goals: Week 2:  OT Short Term Goal 1 (Week 2): Pt will transfer with assist of 1 person. OT Short Term Goal 2 (Week 2): Pt will maintain sitting balance with no more than mod A within BADL tasks OT Short Term Goal 3 (Week 2): Pt will demonstrate self-ROM of L UE OT Short Term Goal 4 (Week 2): Pt will locate 2 items on L side of the sink with mod questioning cues  Skilled Therapeutic Interventions/Progress Updates:    Upon entering the room, pt supine in bed in bed with daughter present in room and alert. Pt is agreeable to OT intervention. Supine >sit with total  +2 assistance. Pt sitting on EOB for 20 minutes with close supervision - min A for sitting balance while combing hair, hand over hand assist to comb hair with R UE, and scapular mobilizations. Pt then leaning onto R elbow and returning to midline with total A then leaning towards L with mod A to return to midline. Pt following 1 step commands with increased time this session. Sit >supine with total A +2. Pt notifying therapist he was wet and rolled to the R with mod A and to the L with max A. OT changing brief and performing LB clothing management. Pt repositioned to protect the hemiplegic side. Call bell and all needed items within reach. Caregiver remains in the room.   Therapy Documentation Precautions:  Precautions Precautions: Fall Precaution Comments: R hemi; R inattention Restrictions Weight Bearing Restrictions: No General:   Vital Signs: Therapy Vitals Temp: 97.8 F (36.6 C) Pulse Rate: 72 Resp: 18 BP: (!) 145/68 Patient Position (if appropriate): Lying Oxygen Therapy SpO2: 98 % O2 Device: Room Air   Therapy/Group: Individual Therapy  Gypsy Decant 09/18/2019, 4:29 PM

## 2019-09-18 NOTE — Progress Notes (Signed)
Physical Therapy Session Note  Patient Details  Name: David [name] MRN: [id number] Date of Birth: [birth year]  Today's Date: 09/18/2019 PT Individual Time: 0800-0915 PT Individual Time Calculation (min): 75 min   Short Term Goals: Week 2:  PT Short Term Goal 1 (Week 2): Patient will perfrom bed mobility with mod A consistently. PT Short Term Goal 2 (Week 2): Patient will perform transfers with mod A +2. PT Short Term Goal 3 (Week 2): Patient will perform sit to stand with mod A +2. PT Short Term Goal 4 (Week 2): Patient will initiate pre-gait activities.  Skilled Therapeutic Interventions/Progress Updates:     Patient in bed with his daughter in the room upon PT arrival. Patient alert and agreeable to PT session. Patient denied pain during session and did not indicated signs of pain throughout session.  Therapeutic Activity: Bed Mobility: Patient performed rolling R with min A and rolling L with mod-max A using bed rails. Provided cues and hand over-hand assist for bending L knee to push R and crossing over R leg to roll L and for reaching with L UE to roll R and holding R UE with his L to roll L. Doffed soiled incontience brief and placed and removed bed pan. Patient was incontinent of bladder in incontiencne brief and unable to have a BM on the bed pan. Required total A for peri-care. He performed supine to sit with mod A of 1 and min A of a second person with use of a bed rail. Provided verbal cues for performing rolling to the R, bringing LEs off the bed, then pushing up with his L hand. Transfers: Patient performed a lateral scoot transfer to the [facility] with max A of 2. Provided cues for hand placement and head-hips relationship for proper technique and decreased assist with transfers. He again was unable to have a BM, despite reporting that he felt the need to and passed gas on the [facility]. PT provided abdominal massage and education about relaxation over straining to have a BM. He doffed a  hospital gown and donned a shirt with max-total A, donned a new brief and pants with total A, and performed peri-care with total A during toileting. He performed sit to/from stand using the [place] with mod A of 1 person and a second to assist with moving to the [place] wheelchair. Provided verbal cues for hand placement, finding midline in standing and forward weight shift.  Neuromuscular Re-ed: Patient performed the following sitting balance activities: -sitting balance EOB and on BSC working on finding midline and reduced pushing with L UE due to R lean in sitting. Required mod A for sitting balance initially and progressed to close supervision -head turns R x5 with cues to hold gaze and rotation, able to sustain 6-10 seconds each trial with ~45 degrees rotation past midline -R elbow flexion/extension PROM, while patient was on bed pan, for reduced tone in R UE  Patient in TIS w/c with his daughter present at end of session with breaks locked, seat belt alarm set, and all needs within reach.    Therapy Documentation Precautions:  Precautions Precautions: Fall Precaution Comments: R hemi; R inattention Restrictions Weight Bearing Restrictions: No    Therapy/Group: Individual Therapy  Rayne Cowdrey L Cambelle Suchecki PT, DPT  09/18/2019, 4:19 PM

## 2019-09-19 ENCOUNTER — Inpatient Hospital Stay (HOSPITAL_COMMUNITY): Payer: Medicare Other

## 2019-09-19 ENCOUNTER — Inpatient Hospital Stay (HOSPITAL_COMMUNITY): Payer: Medicare Other | Admitting: Occupational Therapy

## 2019-09-19 ENCOUNTER — Inpatient Hospital Stay (HOSPITAL_COMMUNITY): Payer: Medicare Other | Admitting: Speech Pathology

## 2019-09-19 LAB — GLUCOSE, CAPILLARY
Glucose-Capillary: 99 mg/dL (ref 70–99)
Glucose-Capillary: 123 mg/dL — ABNORMAL HIGH (ref 70–99)
Glucose-Capillary: 103 mg/dL — ABNORMAL HIGH (ref 70–99)
Glucose-Capillary: 98 mg/dL (ref 70–99)

## 2019-09-19 NOTE — Discharge Instructions (Signed)
Inpatient Rehab Discharge Instructions  WAIN MURANO Discharge date and time:    Activities/Precautions/ Functional Status: Activity: no lifting, driving, or strenuous exercise till cleared by MD Diet:  Wound Care: none needed   Functional status:  ___ No restrictions     ___ Walk up steps independently ___ 24/7 supervision/assistance   ___ Walk up steps with assistance ___ Intermittent supervision/assistance  ___ Bathe/dress independently ___ Walk with walker     ___ Bathe/dress with assistance ___ Walk Independently    ___ Shower independently ___ Walk with assistance    ___ Shower with assistance ___ No alcohol     ___ Return to work/school ________  Special Instructions:    My questions have been answered and I understand these instructions. I will adhere to these goals and the provided educational materials after my discharge from the hospital.  Patient/Caregiver Signature _______________________________ Date __________  Clinician Signature _______________________________________ Date __________  Please bring this form and your medication list with you to all your follow-up doctor's appointments.

## 2019-09-19 NOTE — Progress Notes (Signed)
Speech Language Pathology Weekly Progress and Session Note  Patient Details  Name: David [name] MRN: [id number] Date of Birth: 08/26/1923  Beginning of progress report period: September 10, 2019 End of progress report period: Sep 19, 2019  Today's Date: 09/19/2019 SLP Individual Time: 1015-1055 SLP Individual Time Calculation (min): 40 min  Short Term Goals: Week 1: SLP Short Term Goal 1 (Week 1): Patient will utilize speech intelligibility strategies at the phrase level to achieve ~50% intelligibility with Max verbal cues. SLP Short Term Goal 1 - Progress (Week 1): Met SLP Short Term Goal 2 (Week 1): Patient will verbalize wants/needs at the phrase level with Max verbal cues. SLP Short Term Goal 2 - Progress (Week 1): Met SLP Short Term Goal 3 (Week 1): Patient will follow 2 step commands with 50% accuracy and Max verbal and visual cues. SLP Short Term Goal 3 - Progress (Week 1): Met SLP Short Term Goal 4 (Week 1): Patient will demonstrate sustained attention to functional tasks for 5 minutes with Mod verbal cues for redirection. SLP Short Term Goal 4 - Progress (Week 1): Met SLP Short Term Goal 5 (Week 1): Patient will demonstrate orientation to place, time and situation with Max A multimodal cues. SLP Short Term Goal 5 - Progress (Week 1): Not met SLP Short Term Goal 6 (Week 1): Patient will conusme current diet with minimal overt s/s of aspiration with Mod verbal cues for use of swallowing compensatory strategies. SLP Short Term Goal 6 - Progress (Week 1): Met    New Short Term Goals: Week 2: SLP Short Term Goal 1 (Week 2): Patient will utilize speech intelligibility strategies at the phrase level to achieve ~75% intelligibility with Max verbal cues. SLP Short Term Goal 2 (Week 2): Patient will verbalize wants/needs at the phrase level with Mod verbal cues. SLP Short Term Goal 3 (Week 2): Patient will follow 2 step commands with 75% accuracy and Max verbal and visual cues. SLP Short  Term Goal 4 (Week 2): Patient will demonstrate sustained attention to functional tasks for 15 minutes with Mod verbal cues for redirection. SLP Short Term Goal 5 (Week 2): Patient will demonstrate orientation to place, time and situation with Max A multimodal cues. SLP Short Term Goal 6 (Week 2): Patient will conusme current diet with minimal overt s/s of aspiration with Min verbal cues for use of swallowing compensatory strategies.  Weekly Progress Updates: Patient has made functional gains and has met 5 of 6 STGs this reporting period. Currently, patient is consuming Dys. 1 textures with honey-thick liquids with minimal overt s/s of aspiration and overall Mod A verbal cues for use of swallowing compensatory strategies. Patient demonstrates overall improved verbal expression and speech intelligibility and requires overall Max A multimodal cues for use of speech intelligibility strategies and for use of verbal expression at the word and phrase level. Patient continues to be disoriented despite Max A multimodal cues but demonstrates improved intellectual awareness of deficits. However, patient can be self-limiting at times. Patient and family education ongoing. Patient would benefit from continued skilled SLP intervention to maximize his swallowing, speech, and cognitive-linguistic functioning prior to discharge.      Intensity: Minumum of 1-2 x/day, 30 to 90 minutes Frequency: 3 to 5 out of 7 days Duration/Length of Stay: 5/25 Treatment/Interventions: Cognitive remediation/compensation;Internal/external aids;Speech/Language facilitation;Environmental Environmental consultant;Therapeutic Activities;Patient/family education;Functional tasks;Dysphagia/aspiration precaution training   Daily Session  Skilled Therapeutic Interventions: Skilled treatment session focused on cognitive goals. SLP facilitated session by providing Min A verbal cues  for sorting coins from a field of 4 but Max-Total A to identify  or generate specific amounts of coins. Patient consistently verbalized "I don't know" and required Mod-Max A verbal and tactile cues to initiate reaching for coins throughout session. Patient's function may have been impacted by decreased visual acuity, therefore, patient's daughter reported she will bring his glasses from home. Patient also required Max-Total A for orientation to place and situation, therefore, visual aids were generated to maximize recall of information. Patient left upright in bed with alarm on and all needs within reach. Continue with current plan of care.      Pain No/Denies Pain   Therapy/Group: Individual Therapy  Gable Odonohue 09/19/2019, 6:21 AM

## 2019-09-19 NOTE — Progress Notes (Signed)
Physical Therapy Session Note  Patient Details  Name: David [name] MRN: [id number] Date of Birth: 1923/10/10  Today's Date: 09/19/2019 PT Individual Time: [id number] and [id number] PT Individual Time Calculation (min): 45 min and 45 min  Short Term Goals: Week 2:  PT Short Term Goal 1 (Week 2): Patient will perfrom bed mobility with mod A consistently. PT Short Term Goal 2 (Week 2): Patient will perform transfers with mod A +2. PT Short Term Goal 3 (Week 2): Patient will perform sit to stand with mod A +2. PT Short Term Goal 4 (Week 2): Patient will initiate pre-gait activities.  Skilled Therapeutic Interventions/Progress Updates:     Session 1: Patient in bed with his daughter in the room upon PT arrival. Patient alert and agreeable to PT session. Patient denied pain during session and did not indicated signs of pain throughout session. Patient was incontinent of bladder at beginning of session. Patient's daughter reported that he has been uncomfortable in the TIS w/c, changed out cushion for hybrid Roho cushion and pumped cushion using smart gauge.   Therapeutic Activity: Bed Mobility: Patient performed rolling R with min A and L with mod-max A with use of bed rails. He performed supine to sit with mod A. Provided verbal cues for LE placement during rolling, holding R UE with L with rolling L, and performing log rolling to his L side and using his L UE to push up to sitting. Transfers: Patient performed a lateral scoot transfer bed>TIS w/c to the R with max A +2. Provided cues for hand placement and head-hips relationship for proper technique and decreased assist with transfers.   Neuromuscular Re-ed: Patient performed the following PROM and AROM activities for increased attention to R side, decreased tone of R UE/LE, and motor contorl: -R elbow and shoulder PROM  -R LE knee extension/flexion with AROM with extensor tone and PNF tapping to R quad in sitting x4 -sitting balance x2 min  progressing form mod-CGA for posterior and R lean that corrected to midline with multi-modal cues  Patient in TIS w/c with his daughter in the room at end of session with breaks locked, seat belt alarm set, and all needs within reach.   Session 2: Patient in TIS w/c with his daughter in the room upon PT arrival. Patient alert and agreeable to PT session. Patient denied pain during session and did not indicated signs of pain throughout session. Patient's daughter reported that the patient has had a BM after 8am session the past 2 days while seated in the chair. Plan to have patient in bed following first session to accommodate use of bed pan until patient can transfer to [facility] with nursing staff.   Therapeutic Activity: Bed Mobility: Patient performed sit to supine with mod-max A +2 due to fatigue at end of session. Provided verbal cues for lowering to his L elbow then side-lying for increased trunk control. Transfers: Patient performed sit to/from stand x3 with max A x1 and mod A x2 using L rail in the hall by the main gym. Provided verbal cues and manual facilitation for scooting forward in the chair, hand placement, foot placement, forward weight shift, and hip, knee, and trunk extension.  Patient performed a lateral scoot transfer TIS w/c>bed to the L with max A +2. Provided cues for hand placement and head-hips relationship for proper technique and decreased assist with transfers.   Gait Training:  Pre-gait: Patient worked on midline alinement in front of a mirror in standing, also focused  on erect posture with manual facilitation to thoracic spine and head, then performed B weight shifts with PT blocking R knee to prevent buckling x8. Patient ambulated 3 feet, 15 feet, and 19 feet using L rail and DF wrap for second and third trials with max A for balance/trunk support, total A for R LE advancement, and +2 for w/c follow. Ambulated with flexed posture, pushing R with fatigue, impulsive step with L  LE, required max cues for waiting for PT to block R knee, and decreased weight shift to the R. Provided simple one-step verbal cues for sequencing, manual facilitation for trunk extension and weight shifting.  Wheelchair Mobility:  Patient was transported in the [facility] w/c with total A throughout session for energy conservation and time management.  Patient in bed with R PRAFO and R wrist splint donned and elevated with both of his daughters at bedside at end of session with breaks locked, bed alarm set, and all needs within reach. Patient was pleased with his progress this session, however, does appear to be frustrated with slow recovery of R UE/LE. Provided encouragement and education throughout session about motor recovery.    Therapy Documentation Precautions:  Precautions Precautions: Fall Precaution Comments: R hemi; R inattention Restrictions Weight Bearing Restrictions: No    Therapy/Group: Individual Therapy  David [name] L Elmyra Banwart PT, DPT  09/19/2019, 4:52 PM

## 2019-09-19 NOTE — Progress Notes (Signed)
Occupational Therapy Session Note  Patient Details  Name: David [name] MRN: [id number] Date of Birth: 10/01/1923  Today's Date: 09/19/2019 OT Individual Time: 1130-1200 OT Individual Time Calculation (min): 30 min   Short Term Goals: Week 2:  OT Short Term Goal 1 (Week 2): Pt will transfer with assist of 1 person. OT Short Term Goal 2 (Week 2): Pt will maintain sitting balance with no more than mod A within BADL tasks OT Short Term Goal 3 (Week 2): Pt will demonstrate self-ROM of L UE OT Short Term Goal 4 (Week 2): Pt will locate 2 items on L side of the sink with mod questioning cues  Skilled Therapeutic Interventions/Progress Updates:    Pt greeted semi-reclined in bed and agreeable to OT treatment session. Pt's brief checked and clean. Total A to thread pant legs, then worked on hip bridging to pull up pants. OT provided positioning and support to R LE, then pt able to bend L knee and could activate glutes enough to lift off of the bed while OT and tech pulled up pants. Max+2 to come to sitting EOB. Worked on sitting balance at EOB while doning shirt with max A, but improved attention to L side. Squat-pivot bec to TIS wc with max A +2. OT placed SAEBO e-stim on wrist extensors. SAEBO left on for 60 minutes. OT returned to remove SAEBO with skin intact and no adverse reactions.  Saebo Stim One 330 pulse width 35 Hz pulse rate On 8 sec/ off 8 sec Ramp up/ down 2 sec Symmetrical Biphasic wave form  Max intensity 115mA at 500 Ohm load  Therapy Documentation Precautions:  Precautions Precautions: Fall Precaution Comments: R hemi; R inattention Restrictions Weight Bearing Restrictions: No Pain:   denies pain  Therapy/Group: Individual Therapy  Valma Cava 09/19/2019, 11:58 AM

## 2019-09-19 NOTE — Progress Notes (Signed)
[place] PHYSICAL MEDICINE & REHABILITATION PROGRESS NOTE   Subjective/Complaints: Pt up in chair. Very alert. No new issues this morning. Daughter at his side  ROS: Limited due to cognitive/behavioral     Objective:   No results found. No results for input(s): WBC, HGB, HCT, PLT in the last 72 hours. No results for input(s): NA, K, CL, CO2, GLUCOSE, BUN, CREATININE, CALCIUM in the last 72 hours.  Intake/Output Summary (Last 24 hours) at 09/19/2019 0958 Last data filed at 09/19/2019 0815 Gross per 24 hour  Intake 356 ml  Output --  Net 356 ml     Physical Exam: Vital Signs Blood pressure (!) 145/88, pulse 72, temperature 98.4 F (36.9 C), temperature source Oral, resp. rate 18, weight 110.2 kg, SpO2 98 %. Constitutional: No distress . Vital signs reviewed. HEENT: EOMI, oral membranes moist Neck: supple Cardiovascular: PAC/RRR without murmur. No JVD    Respiratory/Chest: CTA Bilaterally without wheezes or rales. Normal effort    GI/Abdomen: BS +, non-tender, non-distended Ext: no clubbing, cyanosis, or edema Psych: appears more up beat and engaged today Musc: No edema in extremities.  No tenderness in extremities. Neurological: Alert Global aphasia. Dysarthric but speech clearer today. Often answered me appropriately this morning.  Doesn't initiate with rightarm. Some right hip flexor and HAD movement today. Skin: numerous bruises/lacs on arms all stable.  Small area redness near sacrum/buttocks    Assessment/Plan: 1. Functional deficits secondary to left BG ICH which require 3+ hours per day of interdisciplinary therapy in a comprehensive inpatient rehab setting.  Physiatrist is providing close team supervision and 24 hour management of active medical problems listed below.  Physiatrist and rehab team continue to assess barriers to discharge/monitor patient progress toward functional and medical goals  Care Tool:  Bathing        Body parts bathed by helper: Front  perineal area, Buttocks     Bathing assist Assist Level: 2 Helpers     Upper Body Dressing/Undressing Upper body dressing Upper body dressing/undressing activity did not occur (including orthotics): N/A What is the patient wearing?: Hospital gown only    Upper body assist Assist Level: Dependent - Patient 0%    Lower Body Dressing/Undressing Lower body dressing      What is the patient wearing?: Incontinence brief, Pants     Lower body assist Assist for lower body dressing: Dependent - Patient 0%     Toileting Toileting    Toileting assist Assist for toileting: 2 Helpers     Transfers Chair/bed transfer  Transfers assist  Chair/bed transfer activity did not occur: Safety/medical concerns  Chair/bed transfer assist level: Dependent - mechanical lift(Stedy)     Locomotion Ambulation   Ambulation assist   Ambulation activity did not occur: Safety/medical concerns          Walk 10 feet activity   Assist  Walk 10 feet activity did not occur: Safety/medical concerns        Walk 50 feet activity   Assist Walk 50 feet with 2 turns activity did not occur: Safety/medical concerns         Walk 150 feet activity   Assist Walk 150 feet activity did not occur: Safety/medical concerns         Walk 10 feet on uneven surface  activity   Assist Walk 10 feet on uneven surfaces activity did not occur: Safety/medical concerns         Wheelchair     Assist Will patient use wheelchair at discharge?:  Yes Type of Wheelchair: Manual Wheelchair activity did not occur: Safety/medical concerns(lethargy; TIS would be dependent)         Wheelchair 50 feet with 2 turns activity    Assist            Wheelchair 150 feet activity     Assist          Blood pressure (!) 145/88, pulse 72, temperature 98.4 F (36.9 C), temperature source Oral, resp. rate 18, weight 110.2 kg, SpO2 98 %.  Medical Problem List and  Plan: 1.Functional, mobility and cognitive deficitssecondary to left basal ganglia hemorrhage  -recent CT with sl increase in hematoma size/surrounding edema  Continue CIR PT, OT, SLP   -therapies 15/7, arousal still can wax and wane. Spoke with daughter regarding expected course. Much more alert this morning  -PRAFO/AFO 2. Antithrombotics: -DVT/anticoagulation:Mechanical:Sequential compression devices, below kneeBilateral lower extremities--platelets stable to increased 90k -antiplatelet therapy: N/A 3.Chronic LBP/Pain Management:Tylenol prn. 4. Mood:LCSW to follow for evaluation and support.asked daughter if she felt he was depressed, and she wasn't sure -antipsychotic agents: N/A 5. Neuropsych: This patientis notcapable of making decisions on hisown behalf.  -  low dose amantadine to assist with day time arousal.  100mg  daily.   6. Skin/Wound Care:Routine pressure relief measures. 7. Fluids/Electrolytes/Nutrition:resumed po diet  -continue IVF at [facility] for now   .check bmet and prealbumin tomorrow   8. HTN:  SBP goal<140--added prn catapres  -resumed low dose tenormin 5/5--general improvement so far.  9.H/oCOPD:Stable without meds. 10. Impaired glucose tolerance:CBG's wnl so far.  - SSI as indicated.  Controlled on 5/6 11. CAD/AVR:Off ASA due to bleed. No statin due to intolerance. Tenormin, cozaar and lasix (used prn) on hold.  -no TEE indicated 12. Thrombocytopenia:   Platelets 90 on 4/29---> 76k  ---continue to follow 13. AKI: Creatinine 1.03  -continue IVF until diet upgraded 14.  Post stroke dysphagia:               -aspiration precautions  -D1 honey resumed by SLP on 4/29  Advance diet as tolerated  Continue HS IVF until liquids are upgraded 15. Constipation: Senna at bedtime      LOS: 10 days A FACE TO [place] 09/19/2019, 9:58 AM

## 2019-09-20 ENCOUNTER — Inpatient Hospital Stay (HOSPITAL_COMMUNITY): Payer: Medicare Other | Admitting: Occupational Therapy

## 2019-09-20 ENCOUNTER — Inpatient Hospital Stay (HOSPITAL_COMMUNITY): Payer: Medicare Other | Admitting: Speech Pathology

## 2019-09-20 ENCOUNTER — Inpatient Hospital Stay (HOSPITAL_COMMUNITY): Payer: Medicare Other | Admitting: Physical Therapy

## 2019-09-20 LAB — BASIC METABOLIC PANEL
Anion gap: 10 (ref 5–15)
BUN: 8 mg/dL (ref 8–23)
CO2: 24 mmol/L (ref 22–32)
Calcium: 8.9 mg/dL (ref 8.9–10.3)
Chloride: 106 mmol/L (ref 98–111)
Creatinine, Ser: 1.11 mg/dL (ref 0.61–1.24)
GFR calc Af Amer: >60 mL/min (ref >60)
GFR calc non Af Amer: 57 mL/min — ABNORMAL LOW (ref >60)
Glucose, Bld: 105 mg/dL — ABNORMAL HIGH (ref 70–99)
Potassium: 3.7 mmol/L (ref 3.5–5.1)
Sodium: 140 mmol/L (ref 135–145)

## 2019-09-20 LAB — GLUCOSE, CAPILLARY
Glucose-Capillary: 96 mg/dL (ref 70–99)
Glucose-Capillary: 139 mg/dL — ABNORMAL HIGH (ref 70–99)
Glucose-Capillary: 102 mg/dL — ABNORMAL HIGH (ref 70–99)
Glucose-Capillary: 134 mg/dL — ABNORMAL HIGH (ref 70–99)

## 2019-09-20 LAB — CBC
HCT: 46.7 % (ref 39.0–52.0)
Hemoglobin: 15.7 g/dL (ref 13.0–17.0)
MCH: 30.5 pg (ref 26.0–34.0)
MCHC: 33.6 g/dL (ref 30.0–36.0)
MCV: 90.9 fL (ref 80.0–100.0)
Platelets: 146 10*3/uL — ABNORMAL LOW (ref 150–400)
RBC: 5.14 MIL/uL (ref 4.22–5.81)
RDW: 13.7 % (ref 11.5–15.5)
WBC: 8.4 10*3/uL (ref 4.0–10.5)
nRBC: 0 % (ref 0.0–0.2)

## 2019-09-20 LAB — PREALBUMIN: Prealbumin: 22.6 mg/dL (ref 18–38)

## 2019-09-20 MED ORDER — AMANTADINE HCL 100 MG PO CAPS
100.0000 mg | ORAL_CAPSULE | Freq: Two times a day (BID) | ORAL | Status: DC
  Administered 2019-09-20 – 2019-09-28 (×15): 100 mg via ORAL
  Filled 2019-09-20 (×18): qty 1

## 2019-09-20 NOTE — Progress Notes (Signed)
Patient's IV infiltrated. Writer notified Dr Naaman Plummer who said not to run the IV fluids tonight. Patient will be evaluated tomorrow for necessity of continuing IV fluids at night.

## 2019-09-20 NOTE — Progress Notes (Signed)
Physical Therapy Session Note  Patient Details  Name: David [name] MRN: [id number] Date of Birth: Sep 17, 1923  Today's Date: 09/20/2019 PT Individual Time: [id number] PT Individual Time Calculation (min): 26 min   Short Term Goals: Week 2:  PT Short Term Goal 1 (Week 2): Patient will perfrom bed mobility with mod A consistently. PT Short Term Goal 2 (Week 2): Patient will perform transfers with mod A +2. PT Short Term Goal 3 (Week 2): Patient will perform sit to stand with mod A +2. PT Short Term Goal 4 (Week 2): Patient will initiate pre-gait activities.  Skilled Therapeutic Interventions/Progress Updates: Pt presents sitting in TIS w/c and agrees to participate in therapy.  Pt performed squat-pivot transfer w/c > bed w/ assist of 2.  Pt sat EOB w/o UE support w/ verbal and visual cues for posture, especially mid-line sitting.  Pt performed transition down to right elbow but required mod A to position and maintain right UE, max A to push up to sitting.  Pt sat EOB x 16' while reaching forward w/ left UE for PTs hand to utilize visual scanning and posture maintenance.  Pt required verbal cues and assist to bring LEs onto bed.  Bed alarm on and all needs in reach.  Family member present throughout session and remained w/ pt after PT rx.     Therapy Documentation Precautions:  Precautions Precautions: Fall Precaution Comments: R hemi; R inattention Restrictions Weight Bearing Restrictions: No General:   Vital Signs: Therapy Vitals Temp: 98.1 F (36.7 C) Temp Source: Oral Pulse Rate: 79 Resp: 20 BP: 133/68 Patient Position (if appropriate): Sitting Oxygen Therapy SpO2: 99 % O2 Device: Room Air Pain: does not appear to have pain.      Therapy/Group: Individual Therapy  Ladoris Gene 09/20/2019, 4:21 PM

## 2019-09-20 NOTE — Progress Notes (Signed)
Occupational Therapy Session Note  Patient Details  Name: David [name] MRN: [id number] Date of Birth: 02/17/1924  Today's Date: 09/20/2019 OT Individual Time: 1345-1415 OT Individual Time Calculation (min): 30 min    Short Term Goals: Week 2:  OT Short Term Goal 1 (Week 2): Pt will transfer with assist of 1 person. OT Short Term Goal 2 (Week 2): Pt will maintain sitting balance with no more than mod A within BADL tasks OT Short Term Goal 3 (Week 2): Pt will demonstrate self-ROM of L UE OT Short Term Goal 4 (Week 2): Pt will locate 2 items on L side of the sink with mod questioning cues  Skilled Therapeutic Interventions/Progress Updates:    Pt greeted semi-reclined in bed awake and agreeable to OT treatment session. Pt's daughters present during session. OT checked that pt's brief was dry, then brought pt to sitting EOB with max A of 1. Worked on sitting balance at EOB with cues and facilitation for anterior weight shift and max A, progressing to min A. Total A to don shoes. Pt then completed squat-pivot to stronger L side with Max A of 1, but +2 present for set-up of wc and safety. OT provided massage and gentle ROM to R UE. Pt able to pull some with R shoulder, but no activation noted in elbow or hand. OT placed SAEBO e-stim on wrist extensors. SAEBO left on for 60 minutes. OT returned to remove SAEBO with skin intact and no adverse reactions.  Saebo Stim One 330 pulse width 35 Hz pulse rate On 8 sec/ off 8 sec Ramp up/ down 2 sec Symmetrical Biphasic wave form  Max intensity 165mA at 500 Ohm load   Therapy Documentation Precautions:  Precautions Precautions: Fall Precaution Comments: R hemi; R inattention Restrictions Weight Bearing Restrictions: No Pain:  denies pain  Therapy/Group: Individual Therapy  Valma Cava 09/20/2019, 2:17 PM

## 2019-09-20 NOTE — Progress Notes (Signed)
Occupational Therapy Session Note  Patient Details  Name: David [name] MRN: [id number] Date of Birth: [birth year]  Today's Date: 09/20/2019 OT Individual Time: 0815-0900 OT Individual Time Calculation (min): 45 min    Short Term Goals: Week 2:  OT Short Term Goal 1 (Week 2): Pt will transfer with assist of 1 person. OT Short Term Goal 2 (Week 2): Pt will maintain sitting balance with no more than mod A within BADL tasks OT Short Term Goal 3 (Week 2): Pt will demonstrate self-ROM of L UE OT Short Term Goal 4 (Week 2): Pt will locate 2 items on L side of the sink with mod questioning cues  Skilled Therapeutic Interventions/Progress Updates:    Pt resting in bed upon arrival with daughter present.  Pt very lethargic with garbled language.  Daughter stated he is usually more alert.  After approx 15 mins pt speech more readily understood and pt able to follow commands.  OT intervention with focus on bed mobility to facilitate LB dressing tasks, supine>sit EOB, sitting balance, UB dressing, squat pivot transfer, and activity tolerance to increase independence with BADLs.  Pt tot A for orientation.  Bed mobility +2.  Sitting balance +2 with posterior lean and slight pushing (?) to R. Pt required max A for UB dressing tasks seated EOB. Squat pivot transfer to w/c with max +2. Pt remained seated in TIS with belt alarm activated.  Daughter present.  Therapy Documentation Precautions:  Precautions Precautions: Fall Precaution Comments: R hemi; R inattention Restrictions Weight Bearing Restrictions: No Pain:  Pt with no c/o pain this morning   Therapy/Group: Individual Therapy  Leroy Libman 09/20/2019, 10:19 AM

## 2019-09-20 NOTE — Progress Notes (Signed)
Speech Language Pathology Daily Session Note  Patient Details  Name: David [name] MRN: [id number] Date of Birth: 10-10-1923  Today's Date: 09/20/2019 SLP Individual Time: 1030-1100 SLP Individual Time Calculation (min): 30 min  Short Term Goals: Week 2: SLP Short Term Goal 1 (Week 2): Patient will utilize speech intelligibility strategies at the phrase level to achieve ~75% intelligibility with Max verbal cues. SLP Short Term Goal 2 (Week 2): Patient will verbalize wants/needs at the phrase level with Mod verbal cues. SLP Short Term Goal 3 (Week 2): Patient will follow 2 step commands with 75% accuracy and Max verbal and visual cues. SLP Short Term Goal 4 (Week 2): Patient will demonstrate sustained attention to functional tasks for 15 minutes with Mod verbal cues for redirection. SLP Short Term Goal 5 (Week 2): Patient will demonstrate orientation to place, time and situation with Max A multimodal cues. SLP Short Term Goal 6 (Week 2): Patient will conusme current diet with minimal overt s/s of aspiration with Min verbal cues for use of swallowing compensatory strategies.  Skilled Therapeutic Interventions: Skilled treatment session focused on cognitive-linguistic goals. SLP facilitated session by providing Max A multimodal cues for patient to find and utilize the "red button" to call the nurse to request assistance. The patient reported, "I have never used it before" and was educated on importance of requesting assistance when needed. SLP also facilitated session by providing Max A verbal cues to generate a list of phrases that focus on wants/needs that he can read aloud when needed. The list was typed, laminated and placed on bed rail to maximize use. Patient left in tilt-in-space wheelchair with daughter present. Continue with current plan of care.       Pain No/Denies Pain   Therapy/Group: Individual Therapy  [name] 09/20/2019, 3:17 PM

## 2019-09-20 NOTE — Progress Notes (Signed)
[place] PHYSICAL MEDICINE & REHABILITATION PROGRESS NOTE   Subjective/Complaints: Sitting up EOB with OT. No new issues overnight although daughter said he was very slow to arouse initially this morning  ROS: Limited due to cognitive/behavioral    Objective:   No results found. Recent Labs    09/20/19 0606  WBC 8.4  HGB 15.7  HCT 46.7  PLT 146*   Recent Labs    09/20/19 0606  NA 140  K 3.7  CL 106  CO2 24  GLUCOSE 105*  BUN 8  CREATININE 1.11  CALCIUM 8.9    Intake/Output Summary (Last 24 hours) at 09/20/2019 1015 Last data filed at 09/20/2019 0500 Gross per 24 hour  Intake 5026.38 ml  Output --  Net 5026.38 ml     Physical Exam: Vital Signs Blood pressure (!) 148/82, pulse 72, temperature 98.3 F (36.8 C), temperature source Oral, resp. rate 18, weight 110.2 kg, SpO2 94 %. Constitutional: No distress . Vital signs reviewed. HEENT: EOMI, oral membranes moist Neck: supple Cardiovascular: RRR without murmur. No JVD    Respiratory/Chest: CTA Bilaterally without wheezes or rales. Normal effort    GI/Abdomen: BS +, non-tender, non-distended Ext: no clubbing, cyanosis, or edema Psych: pleasant and cooperative Musc: No edema in extremities.  No tenderness in extremities. Neurological: Alert Global aphasia. Dysarthric, aphasic. Answers appropriately 25% or less. Some right hip flexor and HAD movement today. Flexor tone RUE, extensor tone RLE Skin: numerous bruises/lacs on arms all stable.  Small area redness near sacrum/buttocks    Assessment/Plan: 1. Functional deficits secondary to left BG ICH which require 3+ hours per day of interdisciplinary therapy in a comprehensive inpatient rehab setting.  Physiatrist is providing close team supervision and 24 hour management of active medical problems listed below.  Physiatrist and rehab team continue to assess barriers to discharge/monitor patient progress toward functional and medical goals  Care  Tool:  Bathing        Body parts bathed by helper: Front perineal area, Buttocks     Bathing assist Assist Level: 2 Helpers     Upper Body Dressing/Undressing Upper body dressing Upper body dressing/undressing activity did not occur (including orthotics): N/A What is the patient wearing?: Hospital gown only    Upper body assist Assist Level: Dependent - Patient 0%    Lower Body Dressing/Undressing Lower body dressing      What is the patient wearing?: Incontinence brief, Pants     Lower body assist Assist for lower body dressing: Dependent - Patient 0%     Toileting Toileting    Toileting assist Assist for toileting: 2 Helpers     Transfers Chair/bed transfer  Transfers assist  Chair/bed transfer activity did not occur: Safety/medical concerns  Chair/bed transfer assist level: 2 Helpers     Locomotion Ambulation   Ambulation assist   Ambulation activity did not occur: Safety/medical concerns  Assist level: Maximal Assistance - Patient 25 - 49% Assistive device: Other (comment)(L rail) Max distance: 19 ft   Walk 10 feet activity   Assist  Walk 10 feet activity did not occur: Safety/medical concerns  Assist level: Maximal Assistance - Patient 25 - 49% Assistive device: Other (comment)(L rail)   Walk 50 feet activity   Assist Walk 50 feet with 2 turns activity did not occur: Safety/medical concerns         Walk 150 feet activity   Assist Walk 150 feet activity did not occur: Safety/medical concerns         Walk  10 feet on uneven surface  activity   Assist Walk 10 feet on uneven surfaces activity did not occur: Safety/medical concerns         Wheelchair     Assist Will patient use wheelchair at discharge?: Yes Type of Wheelchair: Manual Wheelchair activity did not occur: Safety/medical concerns(lethargy; TIS would be dependent)         Wheelchair 50 feet with 2 turns activity    Assist             Wheelchair 150 feet activity     Assist          Blood pressure (!) 148/82, pulse 72, temperature 98.3 F (36.8 C), temperature source Oral, resp. rate 18, weight 110.2 kg, SpO2 94 %.  Medical Problem List and Plan: 1.Functional, mobility and cognitive deficitssecondary to left basal ganglia hemorrhage  -recent CT with sl increase in hematoma size/surrounding edema  Continue CIR PT, OT, SLP   -therapies 15/7  5/7 ongoing/increased tone Right side. Not a candidate d/t lethargy for oral antispasmodics.    -PRAFO/AFO, ROM  2. Antithrombotics: -DVT/anticoagulation:Mechanical:Sequential compression devices, below kneeBilateral lower extremities--platelets stable to increased 90k -antiplatelet therapy: N/A 3.Chronic LBP/Pain Management:Tylenol prn. 4. Mood:LCSW to follow for evaluation and support.  -no obvious depression evdient yet -antipsychotic agents: N/A 5. Neuropsych: This patientis notcapable of making decisions on hisown behalf.  -  low dose amantadine to assist with day time arousal.  100mg  daily.    5/7 will try for bid to see if we can help him more consistently alert/initiating, watch bp/hr 6. Skin/Wound Care:Routine pressure relief measures. 7. Fluids/Electrolytes/Nutrition:resumed po diet  -continue IVF at [facility] for now   5/7 bun/cr ok, prealbumin 22.6  8. HTN:  SBP goal<140--added prn catapres  -resumed low dose tenormin 5/5--> continue.  9.H/oCOPD:Stable without meds. 10. Impaired glucose tolerance:CBG's wnl so far.  - SSI as indicated.  Controlled on 5/7 11. CAD/AVR:Off ASA due to bleed. No statin due to intolerance. Tenormin, cozaar and lasix (used prn) on hold.  -no TEE indicated 12. Thrombocytopenia:   Platelets up to 146 5/7 13. 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 Constipation: Senna at bedtime      LOS: 11 days A FACE TO [place] 09/20/2019, 10:15 AM

## 2019-09-21 ENCOUNTER — Inpatient Hospital Stay (HOSPITAL_COMMUNITY): Payer: Medicare Other | Admitting: Physical Therapy

## 2019-09-21 ENCOUNTER — Inpatient Hospital Stay (HOSPITAL_COMMUNITY): Payer: Medicare Other

## 2019-09-21 LAB — GLUCOSE, CAPILLARY
Glucose-Capillary: 93 mg/dL (ref 70–99)
Glucose-Capillary: 127 mg/dL — ABNORMAL HIGH (ref 70–99)
Glucose-Capillary: 97 mg/dL (ref 70–99)
Glucose-Capillary: 113 mg/dL — ABNORMAL HIGH (ref 70–99)

## 2019-09-21 NOTE — Progress Notes (Signed)
[facility] PHYSICAL MEDICINE & REHABILITATION PROGRESS NOTE   Subjective/Complaints: No issues reported overnight. Patient sleeping soundly when I entered.   ROS: Limited due to cognitive/behavioral   Objective:   No results found. Recent Labs    09/20/19 0606  WBC 8.4  HGB 15.7  HCT 46.7  PLT 146*   Recent Labs    09/20/19 0606  NA 140  K 3.7  CL 106  CO2 24  GLUCOSE 105*  BUN 8  CREATININE 1.11  CALCIUM 8.9    Intake/Output Summary (Last 24 hours) at 09/21/2019 1007 Last data filed at 09/20/2019 1928 Gross per 24 hour  Intake 610 ml  Output --  Net 610 ml     Physical Exam: Vital Signs Blood pressure (!) 148/69, pulse 70, temperature 97.6 F (36.4 C), resp. rate 18, weight 110.2 kg, SpO2 97 %. Constitutional: No distress . Vital signs reviewed. HEENT: EOMI, oral membranes moist Neck: supple Cardiovascular: RRR without murmur. No JVD    Respiratory/Chest: CTA Bilaterally without wheezes or rales. Normal effort    GI/Abdomen: BS +, non-tender, non-distended Ext: no clubbing, cyanosis, or edema Psych: slow arousing today Musc: No edema in extremities.  No tenderness in extremities. Neurological:   Global aphasia. Dysarthric, aphasic. Answers appropriately 25% or less typically.. Some right hip flexor and HAD movement today. Flexor tone RUE, extensor tone RLE Skin: numerous bruises/lacs on arms all stable.  Small area redness near sacrum/buttocks  --all areas looking better  Assessment/Plan: 1. Functional deficits secondary to left BG ICH which require 3+ hours per day of interdisciplinary therapy in a comprehensive inpatient rehab setting.  Physiatrist is providing close team supervision and 24 hour management of active medical problems listed below.  Physiatrist and rehab team continue to assess barriers to discharge/monitor patient progress toward functional and medical goals  Care Tool:  Bathing        Body parts bathed by helper: Front perineal  area, Buttocks     Bathing assist Assist Level: 2 Helpers     Upper Body Dressing/Undressing Upper body dressing Upper body dressing/undressing activity did not occur (including orthotics): N/A What is the patient wearing?: Hospital gown only    Upper body assist Assist Level: Dependent - Patient 0%    Lower Body Dressing/Undressing Lower body dressing      What is the patient wearing?: Incontinence brief, Pants     Lower body assist Assist for lower body dressing: Dependent - Patient 0%     Toileting Toileting    Toileting assist Assist for toileting: 2 Helpers     Transfers Chair/bed transfer  Transfers assist  Chair/bed transfer activity did not occur: Safety/medical concerns  Chair/bed transfer assist level: 2 Helpers     Locomotion Ambulation   Ambulation assist   Ambulation activity did not occur: Safety/medical concerns  Assist level: Maximal Assistance - Patient 25 - 49% Assistive device: Other (comment)(L rail) Max distance: 19 ft   Walk 10 feet activity   Assist  Walk 10 feet activity did not occur: Safety/medical concerns  Assist level: Maximal Assistance - Patient 25 - 49% Assistive device: Other (comment)(L rail)   Walk 50 feet activity   Assist Walk 50 feet with 2 turns activity did not occur: Safety/medical concerns         Walk 150 feet activity   Assist Walk 150 feet activity did not occur: Safety/medical concerns         Walk 10 feet on uneven surface  activity  Assist Walk 10 feet on uneven surfaces activity did not occur: Safety/medical concerns         Wheelchair     Assist Will patient use wheelchair at discharge?: Yes Type of Wheelchair: Manual Wheelchair activity did not occur: Safety/medical concerns(lethargy; TIS would be dependent)         Wheelchair 50 feet with 2 turns activity    Assist            Wheelchair 150 feet activity     Assist          Blood pressure (!)  148/69, pulse 70, temperature 97.6 F (36.4 C), resp. rate 18, weight 110.2 kg, SpO2 97 %.  Medical Problem List and Plan: 1.Functional, mobility and cognitive deficitssecondary to left basal ganglia hemorrhage  -recent CT with sl increase in hematoma size/surrounding edema  Continue CIR PT, OT, SLP   -therapies 15/7  5/8 ongoing/increased tone Right side. Not a candidate d/t lethargy for oral antispasmodics.    -PRAFO/AFO, ROM  2. Antithrombotics: -DVT/anticoagulation:Mechanical:Sequential compression devices, below kneeBilateral lower extremities--platelets stable to increased 90k -antiplatelet therapy: N/A 3.Chronic LBP/Pain Management:Tylenol prn. 4. Mood:LCSW to follow for evaluation and support.  -no obvious depression evdient yet -antipsychotic agents: N/A 5. Neuropsych: This patientis notcapable of making decisions on hisown behalf.  -  Amantadine increased to 100mg  bid on 5/7---bp/hr ok 5/8 6. Skin/Wound Care:Routine pressure relief measures. 7. Fluids/Electrolytes/Nutrition:resumed po diet  -IV infiltrated last night 5/7   5/7 bun/cr ok, prealbumin 22.6    -will need to push liquids. If not significant intake today. Will reinsert IV tomorrow again 8. HTN:  SBP goal<140--added prn catapres  -resumed low dose tenormin 5/5--> continue.  9.H/oCOPD:Stable without meds. 10. Impaired glucose tolerance:CBG's wnl so far.  - SSI as indicated.  Controlled on 5/8 11. CAD/AVR:Off ASA due to bleed. No statin due to intolerance. Tenormin, cozaar and lasix (used prn) on hold.  -no TEE indicated 12. Thrombocytopenia:   Platelets up to 146 5/7 13. AKI: Creatinine 1.03  -IVF off for now  -recheck Monday  14.  Post stroke dysphagia:               -aspiration precautions  -D1 honey resumed by SLP on 4/29  Advance diet as tolerated  See above 15. Constipation: Senna at bedtime      LOS: 12 days A FACE TO FACE  EVALUATION WAS PERFORMED  Meredith Staggers 09/21/2019, 10:07 AM

## 2019-09-21 NOTE — Progress Notes (Signed)
Occupational Therapy Session Note  Patient Details  Name: David [name] MRN: [id number] Date of Birth: Sep 28, 1923  Today's Date: 09/21/2019 OT Individual Time: 1300-1400 OT Individual Time Calculation (min): 60 min    Short Term Goals: Week 1:  OT Short Term Goal 1 (Week 1): Pt will perform grooming with min A overall. OT Short Term Goal 1 - Progress (Week 1): Met OT Short Term Goal 2 (Week 1): Pt will maintain sitting balance during self care tasks with max A overall. OT Short Term Goal 2 - Progress (Week 1): Met OT Short Term Goal 3 (Week 1): Pt will perform UB dressing with max A. OT Short Term Goal 3 - Progress (Week 1): Met OT Short Term Goal 4 (Week 1): Pt will transfer with assist of 1 person. OT Short Term Goal 4 - Progress (Week 1): Progressing toward goal  Skilled Therapeutic Interventions/Progress Updates:    OT session focused on sitting balance, postural control R-NMR, and functional transfers. Pt received supine in bed. Completed supine>sit with total A. Donned shirt and shoes while sitting EOB with min-mod A for sitting balance and max A to don shirt. Completed squat pivot transfers bed<>w/c and w/c<>mat table with max A going to the left and right. Second person present for safety. Engaged in dynamic reach while sitting EOM focusing on anterior weight shift with slight weightbearing through RUE. Completed task 3x for ~3 minutes each. Returned to bed with OT providing PROM exercises to RUE. Good tolerance noted. Pt left supine in bed with all needs in reach and daughters present.   Therapy Documentation Precautions:  Precautions Precautions: Fall Precaution Comments: R hemi; R inattention Restrictions Weight Bearing Restrictions: No General:   Vital Signs:  Pain:   ADL:   Vision   Perception    Praxis   Exercises:   Other Treatments:     Therapy/Group: Individual Therapy  Duayne Cal 09/21/2019, 2:01 PM

## 2019-09-21 NOTE — Progress Notes (Signed)
Physical Therapy Session Note  Patient Details  Name: David [name] MRN: [id number] Date of Birth: [birth year]  Today's Date: 09/21/2019 PT Individual Time: 0801-0915 PT Individual Time Calculation (min): 74 min   Short Term Goals: Week 1:  PT Short Term Goal 1 (Week 1): Pt will be able to perform bed mobility with mod assist PT Short Term Goal 1 - Progress (Week 1): Progressing toward goal PT Short Term Goal 2 (Week 1): Pt will be able to initiate transfers with max +2 PT Short Term Goal 2 - Progress (Week 1): Met PT Short Term Goal 3 (Week 1): Pt will be able to reorient to midline seated EOB with mod assist PT Short Term Goal 3 - Progress (Week 1): Met  Skilled Therapeutic Interventions/Progress Updates:     Pt received supine in bed with daughter present. Agreeable to therapy and no report of pain. Pt performs supine to sit with modA from PT. Seated EOB pt requires CGA and occasional modA due to posterior drift. Pt performs side-propping to L and R while PT provides totalA to don LE clothing. Pt performs lateral scoot transfer to the L from bed to TIS with modA and cues for body mechanics and timing of initiation.   Pt performs x3 reps of sit to stand in // bars with modA/maxA. PT blocks R knee and positioning R hand on // bar. Pt demos forward flexed posture and requires maxA to correct and encourage upright extension through hips and trunk. Extended seated rest breaks required between each stand.  Pt ambulates 10' with PT and tech using "3 musketeer" technique. PT ace wraps R foot to encourage dorsiflexion and eversion. PT providing totalA to block R knee and progress RLE during swing phase. Pt demos R lateral push that increases with fatigue. MaxA +2 required due to pt's forward flexed posture and R lateral pushing.   Pt performs x1 additional sit to stand with 3 musketeer technique and cues for improved midline positioning. Maintains standing x1 minute.  Lateral transfer back to bed  with modA/maxA. Return to supine with modA +2. Pt left supine in bed with alarm intact and all needs within reach.    Therapy Documentation Precautions:  Precautions Precautions: Fall Precaution Comments: R hemi; R inattention Restrictions Weight Bearing Restrictions: No    Therapy/Group: Individual Therapy  Breck Coons, PT, DPT 09/21/2019, 3:48 PM

## 2019-09-22 ENCOUNTER — Inpatient Hospital Stay (HOSPITAL_COMMUNITY): Payer: Medicare Other

## 2019-09-22 ENCOUNTER — Inpatient Hospital Stay (HOSPITAL_COMMUNITY): Payer: Medicare Other | Admitting: *Deleted

## 2019-09-22 LAB — GLUCOSE, CAPILLARY
Glucose-Capillary: 86 mg/dL (ref 70–99)
Glucose-Capillary: 118 mg/dL — ABNORMAL HIGH (ref 70–99)
Glucose-Capillary: 123 mg/dL — ABNORMAL HIGH (ref 70–99)
Glucose-Capillary: 100 mg/dL — ABNORMAL HIGH (ref 70–99)

## 2019-09-22 MED ORDER — SODIUM CHLORIDE 0.45 % IV SOLN: INTRAVENOUS | Status: DC

## 2019-09-22 NOTE — Progress Notes (Signed)
Patient very confused today. Refuses medications and refuses breakfast. Daughter in room offering sips of honey thick liquids assisted by RN. Will let MD aware.

## 2019-09-22 NOTE — Plan of Care (Signed)
  Problem: RH BOWEL ELIMINATION Goal: RH STG MANAGE BOWEL WITH ASSISTANCE Description: STG Manage Bowel with mod I Assistance. Outcome: Not Progressing; constipation   Problem: RH BLADDER ELIMINATION Goal: RH STG MANAGE BLADDER WITH ASSISTANCE Description: STG Manage Bladder With mod I Assistance Outcome: Not Progressing;incontinence

## 2019-09-22 NOTE — Progress Notes (Signed)
Occupational Therapy Session Note  Patient Details  Name: David [name] MRN: [id number] Date of Birth: 27-Jan-1924  Today's Date: 09/22/2019 OT Individual Time: [id number] Individual Time Calculation (min): 55 min    Short Term Goals: Week 2:  OT Short Term Goal 1 (Week 2): Pt will transfer with assist of 1 person. OT Short Term Goal 2 (Week 2): Pt will maintain sitting balance with no more than mod A within BADL tasks OT Short Term Goal 3 (Week 2): Pt will demonstrate self-ROM of L UE OT Short Term Goal 4 (Week 2): Pt will locate 2 items on L side of the sink with mod questioning cues  Skilled Therapeutic Interventions/Progress Updates:    Pt resting in bed upon arrival with daughters (2) present. OT intervention with focus on bed mobility, sitting balance, following on step commands, attention to R, and activity tolerance to increase independence with BADLs. Pt's words/conversation unintelligible this afternoon in response to all questions.  Pt not oriented to place or city.  When asked to identify daughters in room, pt did not acknowledge hearing request and did not attempt to look in room to identify anyone. Supine<>sit EOB with max A+2. Sitting balance fluctuated between CGA to max A. Pt followed one command (pulling up socks while seated EOB) during session only after max repetitive instructional cues. Pt would not attend to any items or persons on L/R during session.  Pt conversation was ongoing during session but unintelligible.  Pt unable to tell therapist about his career. Pt returned to supine,R hand splint and R PRAFO donned and call bell placed in reach.  Daughters present in room.  Therapy Documentation Precautions:  Precautions Precautions: Fall Precaution Comments: R hemi; R inattention Restrictions Weight Bearing Restrictions: No  Pain:  Pt with no c/o pain   Therapy/Group: Individual Therapy  Leroy Libman 09/22/2019, 2:36 PM

## 2019-09-22 NOTE — Progress Notes (Signed)
Physical Therapy Session Note  Patient Details  Name: David [name] MRN: [id number] Date of Birth: [birth year]  Today's Date: 09/22/2019 PT Individual Time: [id number] and 1015-1055 PT Individual Time Calculation (min): 17 min and 40 min   Short Term Goals: Week 2:  PT Short Term Goal 1 (Week 2): Patient will perfrom bed mobility with mod A consistently. PT Short Term Goal 2 (Week 2): Patient will perform transfers with mod A +2. PT Short Term Goal 3 (Week 2): Patient will perform sit to stand with mod A +2. PT Short Term Goal 4 (Week 2): Patient will initiate pre-gait activities.  Skilled Therapeutic Interventions/Progress Updates:     Patient in bed with his daughter in the room upon PT arrival. Patient's daughter reported that patient had refused to eat breakfast and take his medication this morning. When patient was asked about how he was feeling he reported that his sleep was interrupted by nursing staff throughout the night, however, his speech continues to be dysarthric and patient is inconsistent with correct reporting. Discussed placing a sleep chart with RN to monitor sleep patterns. Patient took a few sips of honey thick sweet tea then was agreeable to sitting EOB to take his medications. Patient denied pain during session.  IV team came in prior to patient sitting up and placed IV, breaking up session into 2 sessions.   Therapeutic Activity: Bed Mobility: Patient performed supine to sit with mod A +2, sit to supine with max A +1, and scooting up in bed with total A +2 with bed in trendelenburg. Provided verbal cues for rolling L and pushing through L UE to sit up. Educated patient's daughter on safe technique and body mechanics for performing scooting up in bed.   Neuromuscular Re-ed: Patient performed the following sitting balance activities: He sat EOB x17 min with mod A progressing to CGA for ~30 sec bouts with cues then reverting to mod A with posterior lean. In sitting focused  on posture and forward weight shift. Patient took several sips of tea and took medications provided by RN while seated EOB. Also worked on progressing to no UE support in sitting to reduce pushing and activation trunk musculature to find midline.   Patient's second daughter arrived at end of session. Both daughters expressed concern about patient's behavior and poor participation/performance with therapy today. Also disclosed that the patient was talking about making arrangements for end of life and telling them to take care of each other. Will reach out to discuss benefit of neuropsych and palliative consults. Provided education on both of these services and encouraged family to have discussion about what d/c plans they will be comfortable with, however, also educated them on confusion/dilirium in older adults in the hospital with poor sleep and following stroke and encouraged them to provide orientation and remind the patient of his progress earlier this week to encourage continued participation.   Patient in bed with R PRAFO and wrist splint donned and both of his daughters present at end of session with breaks locked, bed alarm set, and all needs within reach.    Therapy Documentation Precautions:  Precautions Precautions: Fall Precaution Comments: R hemi; R inattention Restrictions Weight Bearing Restrictions: No    Therapy/Group: Individual Therapy  Armentha Branagan L Yasmine Kilbourne PT, DPT  09/22/2019, 4:45 PM

## 2019-09-22 NOTE — Progress Notes (Signed)
Patient is more alert able to participate with therapy and took meds; IV started. PT suggest to start sleep chart. Will let MD aware.

## 2019-09-22 NOTE — Progress Notes (Signed)
[facility] PHYSICAL MEDICINE & REHABILITATION PROGRESS NOTE   Subjective/Complaints: Pt slept well. More confused this morning?   [id number] by cognition    Objective:   No results found. Recent Labs    09/20/19 0606  WBC 8.4  HGB 15.7  HCT 46.7  PLT 146*   Recent Labs    09/20/19 0606  NA 140  K 3.7  CL 106  CO2 24  GLUCOSE 105*  BUN 8  CREATININE 1.11  CALCIUM 8.9    Intake/Output Summary (Last 24 hours) at 09/22/2019 0910 Last data filed at 09/21/2019 1800 Gross per 24 hour  Intake 600 ml  Output --  Net 600 ml     Physical Exam: Vital Signs Blood pressure (!) 148/89, pulse 72, temperature 98.1 F (36.7 C), resp. rate 19, weight 110.2 kg, SpO2 97 %. Constitutional: No distress . Vital signs reviewed. HEENT: EOMI, oral membranes moist Neck: supple Cardiovascular: RRR without murmur. No JVD    Respiratory/Chest: CTA Bilaterally without wheezes or rales. Normal effort    GI/Abdomen: BS +, non-tender, non-distended Ext: no clubbing, cyanosis, or edema Psych: slower to engage today.  Musc: No edema in extremities.  No tenderness in extremities. Neurological:   Aphasic, dysarthric, slow processing. . Flexor tone RUE, extensor tone RLE Skin: numerous bruises/lacs on arms all stable.  Small area redness near sacrum/buttocks  --these areas improving  Assessment/Plan: 1. Functional deficits secondary to left BG ICH which require 3+ hours per day of interdisciplinary therapy in a comprehensive inpatient rehab setting.  Physiatrist is providing close team supervision and 24 hour management of active medical problems listed below.  Physiatrist and rehab team continue to assess barriers to discharge/monitor patient progress toward functional and medical goals  Care Tool:  Bathing        Body parts bathed by helper: Front perineal area, Buttocks     Bathing assist Assist Level: 2 Helpers     Upper Body Dressing/Undressing Upper body dressing Upper  body dressing/undressing activity did not occur (including orthotics): N/A What is the patient wearing?: Hospital gown only    Upper body assist Assist Level: Dependent - Patient 0%    Lower Body Dressing/Undressing Lower body dressing      What is the patient wearing?: Incontinence brief, Pants     Lower body assist Assist for lower body dressing: Dependent - Patient 0%     Toileting Toileting    Toileting assist Assist for toileting: 2 Helpers     Transfers Chair/bed transfer  Transfers assist  Chair/bed transfer activity did not occur: Safety/medical concerns  Chair/bed transfer assist level: Moderate Assistance - Patient 50 - 74%     Locomotion Ambulation   Ambulation assist   Ambulation activity did not occur: Safety/medical concerns  Assist level: 2 helpers Assistive device: ("3 musketeers") Max distance: 10'   Walk 10 feet activity   Assist  Walk 10 feet activity did not occur: Safety/medical concerns  Assist level: 2 helpers Assistive device: Other (comment)(L rail)   Walk 50 feet activity   Assist Walk 50 feet with 2 turns activity did not occur: Safety/medical concerns         Walk 150 feet activity   Assist Walk 150 feet activity did not occur: Safety/medical concerns         Walk 10 feet on uneven surface  activity   Assist Walk 10 feet on uneven surfaces activity did not occur: Safety/medical concerns         Wheelchair  Assist Will patient use wheelchair at discharge?: Yes Type of Wheelchair: Manual Wheelchair activity did not occur: Safety/medical concerns(lethargy; TIS would be dependent)         Wheelchair 50 feet with 2 turns activity    Assist            Wheelchair 150 feet activity     Assist          Blood pressure (!) 148/89, pulse 72, temperature 98.1 F (36.7 C), resp. rate 19, weight 110.2 kg, SpO2 97 %.  Medical Problem List and Plan: 1.Functional, mobility and  cognitive deficitssecondary to left basal ganglia hemorrhage  -recent CT with sl increase in hematoma size/surrounding edema  Continue CIR PT, OT, SLP   -therapies 15/7  5/9: ongoing/increased tone Right side. Not a candidate d/t lethargy for oral antispasmodics.    -PRAFO/AFO, ROM       -arousal/mental status still waxes an wanes.    -afebrile. Monitor for now 2. Antithrombotics: -DVT/anticoagulation:Mechanical:Sequential compression devices, below kneeBilateral lower extremities--platelets stable to increased 90k -antiplatelet therapy: N/A 3.Chronic LBP/Pain Management:Tylenol prn. 4. Mood:LCSW to follow for evaluation and support.  -no obvious depression evdient yet -antipsychotic agents: N/A 5. Neuropsych: This patientis notcapable of making decisions on hisown behalf.  -  Amantadine increased to 100mg  bid on 5/7---bp/hr ok 5/8 6. Skin/Wound Care:Routine pressure relief measures. 7. Fluids/Electrolytes/Nutrition:resumed po diet  -IV infiltrated last night 5/7   5/7 bun/cr ok, prealbumin 22.6   5/9 lost IV yesterday. Only 840cc in   -go ahead and restart IV. Will run 1/2NS continuous today    -recheck labs in AM 8. HTN:  SBP goal<140--added prn catapres  -resumed low dose tenormin 5/5--> continue.  9.H/oCOPD:Stable without meds. 10. Impaired glucose tolerance:CBG's wnl so far.  - SSI as indicated.  Controlled on 5/8 11. CAD/AVR:Off ASA due to bleed. No statin due to intolerance. Tenormin, cozaar and lasix (used prn) on hold.  -no TEE indicated 12. Thrombocytopenia:   Platelets up to 146 5/7 13. 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 Constipation: Senna at bedtime      LOS: 13 days A FACE TO [place] 09/22/2019, 9:10  AM

## 2019-09-23 ENCOUNTER — Inpatient Hospital Stay (HOSPITAL_COMMUNITY): Payer: Medicare Other | Admitting: Occupational Therapy

## 2019-09-23 ENCOUNTER — Inpatient Hospital Stay (HOSPITAL_COMMUNITY): Payer: Medicare Other

## 2019-09-23 ENCOUNTER — Inpatient Hospital Stay (HOSPITAL_COMMUNITY): Payer: Medicare Other | Admitting: Speech Pathology

## 2019-09-23 LAB — BASIC METABOLIC PANEL
Anion gap: 10 (ref 5–15)
BUN: 10 mg/dL (ref 8–23)
CO2: 23 mmol/L (ref 22–32)
Calcium: 9.1 mg/dL (ref 8.9–10.3)
Chloride: 107 mmol/L (ref 98–111)
Creatinine, Ser: 1.11 mg/dL (ref 0.61–1.24)
GFR calc Af Amer: >60 mL/min (ref >60)
GFR calc non Af Amer: 57 mL/min — ABNORMAL LOW (ref >60)
Glucose, Bld: 100 mg/dL — ABNORMAL HIGH (ref 70–99)
Potassium: 4.1 mmol/L (ref 3.5–5.1)
Sodium: 140 mmol/L (ref 135–145)

## 2019-09-23 LAB — CBC WITH DIFFERENTIAL/PLATELET
Abs Immature Granulocytes: 0.03 10*3/uL (ref 0.00–0.07)
Basophils Absolute: 0 10*3/uL (ref 0.0–0.1)
Basophils Relative: 1 %
Eosinophils Absolute: 0.2 10*3/uL (ref 0.0–0.5)
Eosinophils Relative: 3 %
HCT: 47.8 % (ref 39.0–52.0)
Hemoglobin: 15.9 g/dL (ref 13.0–17.0)
Immature Granulocytes: 0 %
Lymphocytes Relative: 29 %
Lymphs Abs: 2.2 10*3/uL (ref 0.7–4.0)
MCH: 30.4 pg (ref 26.0–34.0)
MCHC: 33.3 g/dL (ref 30.0–36.0)
MCV: 91.4 fL (ref 80.0–100.0)
Monocytes Absolute: 0.6 10*3/uL (ref 0.1–1.0)
Monocytes Relative: 9 %
Neutro Abs: 4.5 10*3/uL (ref 1.7–7.7)
Neutrophils Relative %: 58 %
Platelets: 122 10*3/uL — ABNORMAL LOW (ref 150–400)
RBC: 5.23 MIL/uL (ref 4.22–5.81)
RDW: 13.7 % (ref 11.5–15.5)
WBC: 7.5 10*3/uL (ref 4.0–10.5)
nRBC: 0 % (ref 0.0–0.2)

## 2019-09-23 LAB — GLUCOSE, CAPILLARY
Glucose-Capillary: 97 mg/dL (ref 70–99)
Glucose-Capillary: 117 mg/dL — ABNORMAL HIGH (ref 70–99)
Glucose-Capillary: 102 mg/dL — ABNORMAL HIGH (ref 70–99)
Glucose-Capillary: 157 mg/dL — ABNORMAL HIGH (ref 70–99)

## 2019-09-23 MED ORDER — SODIUM CHLORIDE 0.9 % IV SOLN: Freq: Every day | INTRAVENOUS | Status: DC

## 2019-09-23 MED ORDER — SODIUM CHLORIDE 0.9 % IV SOLN
INTRAVENOUS | Status: DC
  Filled 2019-09-23: qty 1000

## 2019-09-23 MED ORDER — SODIUM CHLORIDE 0.9 % IV SOLN: INTRAVENOUS | Status: DC

## 2019-09-23 NOTE — Progress Notes (Signed)
Physical Therapy Session Note  Patient Details  Name: David [name] MRN: [id number] Date of Birth: 11-02-1923  Today's Date: 09/23/2019 PT Individual Time: 1301-1350 PT Individual Time Calculation (min): 49 min   Short Term Goals: Week 2:  PT Short Term Goal 1 (Week 2): Patient will perfrom bed mobility with mod A consistently. PT Short Term Goal 2 (Week 2): Patient will perform transfers with mod A +2. PT Short Term Goal 3 (Week 2): Patient will perform sit to stand with mod A +2. PT Short Term Goal 4 (Week 2): Patient will initiate pre-gait activities.  Skilled Therapeutic Interventions/Progress Updates:     Patient in TIS w/c upon PT arrival. Patient alert and agreeable to PT session. Patient denied pain during session and did not show any signs of pain throughout session. Patient more alert and oriented today. Discussed d/c planning and goals for SNF vs home with [facility] therapies at d/c with patient's daughters during session. Educated on services provided, d/c goals for PT based on d/c setting, and potential need for a lift at home if patient unable to perform transfers at a level of assist that his daughter's can manage. Encouraged family to discuss information provided and bring any questions to therapy sessions, as they both attend frequently, as there is still time to make decisions and patient is making good progress with therapies.   Therapeutic Activity: Bed Mobility: Patient performed sit to supine with max A +2 due to fatigue. Provided verbal cues for lowering to his L elbow for improved trunk control. Transfers: Patient performed sit to/from stand x2 using a L rail with min A-mod A for trunk support and blocking R knee. Provided verbal cues for scooting forward, forward weight shift, and hip and knee extension in standing. He performed stand pivot to the L using a bed rail with mod-max A due to increased challenge of stepping while turning with decreased R LE motor control.   Gait  Training:  Patient ambulated 13 feet and 29 feet using L rail with mod A for trunk support and balance and max-total A for R LE advancement. Ambulated with R knee in flexion throughout, increased hip flexion, decreased lateral weight shift, step-to gait pattern leading with R, demonstrated intermittent R LE initiation with stepping today with DF wrap applied during second trial. Provided verbal cues for sequencing, manual facilitation for weight shifting and R LE advancement/placement, and blocking R knee to encourage extension in stance.  Wheelchair Mobility:  Patient was transported in the [place] w/c with total A throughout session for energy conservation and time management.  Neuromuscular Re-ed: Patient performed the following R UE motor control activities: -R UE elbow flexion/extension PROM with slow gentle pressure for reduced tone, able to progress to full ROM with repetition -R UE shoulder IR/ER PROM with slow gentle pressure for reduced tone, limited ROM, improved with repetition to ~45 degrees each direction -R shoulder flexion with slow gentle pressure for reduced tone, very limited to 10-15 degrees due to increased tone today  Patient in bed with his daughters at bedside with B LEs elevated with heels floating and R PRAFO donned, educated on reduced pressure for straps for skin integrity, at end of session with breaks locked, bed alarm set, and all needs within reach.    Therapy Documentation Precautions:  Precautions Precautions: Fall Precaution Comments: R hemi; R inattention Restrictions Weight Bearing Restrictions: No    Therapy/Group: Individual Therapy  Jireh Vinas L [place] Lesure PT, DPT  09/23/2019, 4:36 PM

## 2019-09-23 NOTE — Progress Notes (Signed)
[facility] PHYSICAL MEDICINE & REHABILITATION PROGRESS NOTE   Subjective/Complaints: David [name] daughter at bedside is concerned that his speech and confusion has worsened yesterday and today. He states he has worsening left sided frontal headache. Has difficulty following basic commands on exam which patient's daughter states is new for him. Labs stable today except for thrombocytopenia.   [id number] by cognition   Objective: No results found. Recent Labs    09/23/19 0534  WBC 7.5  HGB 15.9  HCT 47.8  PLT 122*   Recent Labs    09/23/19 0534  NA 140  K 4.1  CL 107  CO2 23  GLUCOSE 100*  BUN 10  CREATININE 1.11  CALCIUM 9.1    Intake/Output Summary (Last 24 hours) at 09/23/2019 1038 Last data filed at 09/23/2019 [id number] Gross per 24 hour  Intake 1455 ml  Output 100 ml  Net 1355 ml     Physical Exam: Vital Signs Blood pressure (!) 160/74, pulse 76, temperature 98.4 F (36.9 C), temperature source Oral, resp. rate 18, weight 104.5 kg, SpO2 94 %. Constitutional: No distress . Vital signs reviewed. HEENT: EOMI, oral membranes moist Neck: supple Cardiovascular: RRR without murmur. No JVD    Respiratory/Chest: CTA Bilaterally without wheezes or rales. Normal effort    GI/Abdomen: BS +, non-tender, non-distended Ext: no clubbing, cyanosis, or edema Psych: slower to engage today.  Musc: No edema in extremities.  No tenderness in extremities. Neurological:   Aphasic, dysarthric, slow processing. Unable to follow basic commands such as elevating limbs and squeezing hand. Flexor tone RUE, extensor tone RLE Skin: numerous bruises/lacs on arms all stable.  Small area redness near sacrum/buttocks  --these areas improving  Assessment/Plan: 1. Functional deficits secondary to left BG ICH which require 3+ hours per day of interdisciplinary therapy in a comprehensive inpatient rehab setting.  Physiatrist is providing close team supervision and 24 hour management of active  medical problems listed below.  Physiatrist and rehab team continue to assess barriers to discharge/monitor patient progress toward functional and medical goals  Care Tool:  Bathing        Body parts bathed by helper: Front perineal area, Buttocks     Bathing assist Assist Level: 2 Helpers     Upper Body Dressing/Undressing Upper body dressing Upper body dressing/undressing activity did not occur (including orthotics): N/A What is the patient wearing?: Hospital gown only    Upper body assist Assist Level: Dependent - Patient 0%    Lower Body Dressing/Undressing Lower body dressing      What is the patient wearing?: Incontinence brief, Pants     Lower body assist Assist for lower body dressing: Dependent - Patient 0%     Toileting Toileting    Toileting assist Assist for toileting: 2 Helpers     Transfers Chair/bed transfer  Transfers assist  Chair/bed transfer activity did not occur: Safety/medical concerns  Chair/bed transfer assist level: Moderate Assistance - Patient 50 - 74%     Locomotion Ambulation   Ambulation assist   Ambulation activity did not occur: Safety/medical concerns  Assist level: 2 helpers Assistive device: ("3 musketeers") Max distance: 10'   Walk 10 feet activity   Assist  Walk 10 feet activity did not occur: Safety/medical concerns  Assist level: 2 helpers Assistive device: Other (comment)(L rail)   Walk 50 feet activity   Assist Walk 50 feet with 2 turns activity did not occur: Safety/medical concerns         Walk 150 feet activity  Assist Walk 150 feet activity did not occur: Safety/medical concerns         Walk 10 feet on uneven surface  activity   Assist Walk 10 feet on uneven surfaces activity did not occur: Safety/medical concerns         Wheelchair     Assist Will patient use wheelchair at discharge?: Yes Type of Wheelchair: Manual Wheelchair activity did not occur: Safety/medical  concerns(lethargy; TIS would be dependent)         Wheelchair 50 feet with 2 turns activity    Assist            Wheelchair 150 feet activity     Assist          Blood pressure (!) 160/74, pulse 76, temperature 98.4 F (36.9 C), temperature source Oral, resp. rate 18, weight 104.5 kg, SpO2 94 %.  Medical Problem List and Plan: 1.Functional, mobility and cognitive deficitssecondary to left basal ganglia hemorrhage  -recent CT with sl increase in hematoma size/surrounding edema  Continue CIR PT, OT, SLP   -therapies 15/7  5/9: ongoing/increased tone Right side. Not a candidate d/t lethargy for oral antispasmodics.    -PRAFO/AFO, ROM       -arousal/mental status still waxes an wanes.    -afebrile. Monitor for now 2. Antithrombotics: -DVT/anticoagulation:Mechanical:Sequential compression devices, below kneeBilateral lower extremities--platelets stable to increased 90k -antiplatelet therapy: N/A 3.Chronic LBP/Pain Management:Tylenol prn. 4. Mood:LCSW to follow for evaluation and support.  -no obvious depression evdient yet -antipsychotic agents: N/A 5. Neuropsych: This patientis notcapable of making decisions on hisown behalf.  -  Amantadine increased to 100mg  bid on 5/7---bp/hr ok 5/8 6. Skin/Wound Care:Routine pressure relief measures. 7. Fluids/Electrolytes/Nutrition:resumed po diet  -IV infiltrated last night 5/7   5/7 bun/cr ok, prealbumin 22.6   5/9 lost IV yesterday. Only 840cc in   -go ahead and restart IV. Will run 1/2NS continuous today    -recheck labs in AM 8. HTN:  SBP goal<140--added prn catapres  -resumed low dose tenormin 5/5--> continue.  9.H/oCOPD:Stable without meds. 10. Impaired glucose tolerance:CBG's wnl so far.  - SSI as indicated.  Controlled on 5/10 at 97 11. CAD/AVR:Off ASA due to bleed. No statin due to intolerance. Tenormin, cozaar and lasix (used prn) on hold.  -no TEE  indicated 12. Thrombocytopenia:   Platelets decreased to 122 on 5/11. Continue to monitor regularly.  13. AKI: Creatinine 1.11 on 5/11, continue to monitor weekly.  14.  Post stroke dysphagia:               -aspiration precautions  -D1 honey resumed by SLP on 4/29  Advance diet as tolerated  See above 15. Constipation: Senna at bedtime  16. Worsening speech/confusion with frontal headache: Repeat CT head [place] to assess for worsening bleed.      LOS: 14 days A FACE TO FACE EVALUATION WAS PERFORMED  David [name] 09/23/2019, 10:38 AM

## 2019-09-23 NOTE — Progress Notes (Signed)
Speech Language Pathology Daily Session Note  Patient Details  Name: David [name] MRN: [id number] Date of Birth: 02-24-1924  Today's Date: 09/23/2019 SLP Individual Time: [id number] SLP Individual Time Calculation (min): 55 min  Short Term Goals: Week 2: SLP Short Term Goal 1 (Week 2): Patient will utilize speech intelligibility strategies at the phrase level to achieve ~75% intelligibility with Max verbal cues. SLP Short Term Goal 2 (Week 2): Patient will verbalize wants/needs at the phrase level with Mod verbal cues. SLP Short Term Goal 3 (Week 2): Patient will follow 2 step commands with 75% accuracy and Max verbal and visual cues. SLP Short Term Goal 4 (Week 2): Patient will demonstrate sustained attention to functional tasks for 15 minutes with Mod verbal cues for redirection. SLP Short Term Goal 5 (Week 2): Patient will demonstrate orientation to place, time and situation with Max A multimodal cues. SLP Short Term Goal 6 (Week 2): Patient will conusme current diet with minimal overt s/s of aspiration with Min verbal cues for use of swallowing compensatory strategies.  Skilled Therapeutic Interventions: Skilled treatment session focused on speech intelligibility and dysphagia goals. SLP facilitated session by providing overall Mod A verbal cues for use of speech intelligibility strategies at the phrase and sentence level to achieve ~75% intelligibility. SLP also facilitated session by administering trials of Dys. 2 textures. Patient demonstrated mildly prolonged mastication and mild oral residue that cleared with a liquid wash without overt s/s of aspiration. Recommend trial tray prior to upgrade. Patient left upright in bed with alarm on and all needs within reach. Continue with current plan of care.      Pain No/Denies Pain   Therapy/Group: Individual Therapy  [name] 09/23/2019, 3:20 PM

## 2019-09-23 NOTE — Progress Notes (Signed)
Called PA to clarify IV orders, pt improved overall from yesterday to today but did c/o of headache, labs improved from 05/09, will keep order for fluids to run at hs to am.

## 2019-09-23 NOTE — Progress Notes (Signed)
Occupational Therapy Weekly Progress Note  Patient Details  Name: David [name] MRN: [id number] Date of Birth: [birth year]  Beginning of progress report period: September 10, 2019 End of progress report period: Sep 23, 2019  Today's Date: 09/23/2019  Session 1 OT Individual Time: 9211-9417 OT Individual Time Calculation (min): 60 min   Session 2 OT Individual Time: 1030-1100 OT Individual Time Calculation (min): 30 min   Patient has met 2 of 3 short term goals.  Pt is making slow progress towards OT goals, but is still making small gains. Pt continues to require +2 assist for transfers, but he is powering up better and initiating more consistently. Pt has demonstrated some slight improved awareness of his R side. Continue current POC.  Patient continues to demonstrate the following deficits: muscle weakness, decreased cardiorespiratoy endurance, impaired timing and sequencing, abnormal tone, unbalanced muscle activation, motor apraxia, ataxia, decreased coordination and decreased motor planning, decreased attention to right and right side neglect, decreased initiation, decreased attention, decreased awareness, decreased problem solving, decreased safety awareness, decreased memory and delayed processing and decreased sitting balance, decreased standing balance, decreased postural control, hemiplegia and decreased balance strategies and therefore will continue to benefit from skilled OT intervention to enhance overall performance with BADL and Reduce care partner burden.  Patient progressing toward long term goals..  Continue plan of care.  OT Short Term Goals Week 2:  OT Short Term Goal 1 (Week 2): Pt will transfer with assist of 1 person. OT Short Term Goal 1 - Progress (Week 2): Progressing toward goal OT Short Term Goal 2 (Week 2): Pt will maintain sitting balance with no more than mod A within BADL tasks OT Short Term Goal 2 - Progress (Week 2): Progressing toward goal OT Short Term Goal  3 (Week 2): Pt will demonstrate self-ROM of L UE OT Short Term Goal 3 - Progress (Week 2): Met OT Short Term Goal 4 (Week 2): Pt will locate 2 items on L side of the sink with mod questioning cues OT Short Term Goal 4 - Progress (Week 2): Met Week 3:  OT Short Term Goal 1 (Week 3): Pt will transfer with assist of 1 person. OT Short Term Goal 2 (Week 3): Pt will maintain sitting balance with no more than mod A within BADL tasks OT Short Term Goal 3 (Week 3): Pt will assist with pulling pant up in bed OT Short Term Goal 4 (Week 3): Pt will attend to R UE with mod verbal cues within BADL tasks  Skilled Therapeutic Interventions/Progress Updates:  Session 1   Pt greeted semi-reclined in bed awake, and less lethargic today. OT was able to make out more words and he made more sense today. Pt had been incontinent of urine in the bed. Worked on rolling with pt able to roll to the R with mod A of 1. Max A of 1 to roll to the L with total A for peri-care and brief change. Total A for LB dressing in bed, but then worked on hip bridging with OT assist to position R knee into flexion with extensor tone that had to be broken to reach knee flexion. PT then able to bridge far enough for OT and pt's daughter to pull pants up over hips. Pt completed bed mobility with max A +2 to come to sitting EOB. Min A up to max A for sitting balance 2.2 posterior and lateral LOB to the R. WOkred on R side awareness with pt needing hand over hand  A with L hand to try to grasp R hand and place in shirt sleeve. Pt continues with very poor awareness of R UE. Squat-pivot from bed to wc on stronger L side with max+2, but improved power up today! Pt completed grooming tasks at the sink with min A to wash hair using wash cap. Min A to comb  Hair as pt perseverating on combing one side and not combing back of hair despite cues. OT set pt up for oral care with suction, then pt able to brush teeth with min cues to maintain attention. OT placed  SAEBO e-stim on shoulder. SAEBO left on for 60 minutes. OT returned to remove SAEBO with skin intact and no adverse reactions.  [place] Stim One 330 pulse width 35 Hz pulse rate On 8 sec/ off 8 sec Ramp up/ down 2 sec Symmetrical Biphasic wave form  Max intensity 19m at 500 Ohm load  Session 2 Pt greeted sitting in wc with daughter present and agreeable to OT treatment session. Pt brought down to therapy apartment for quiet environment to work on RTempe OT provided joint input through wrist and elbow to bring pt through full shoulder and elbow ROM. Increased tone noted throughout UE. OT provided gentle massage and ROM to help break tone in pecs with some improved PROM. Pt with slight scapula retraction noted. Clonus as well to R UE. Educated more on self-ROM with hand over hand A to bring L hand to R, then pt able to demonstrate some wrist self-ROM with OT assist. Pt returned to room and left seated in TIS wc with daughter present, alarm belt on, and needs met.    Therapy Documentation Precautions:  Precautions Precautions: Fall Precaution Comments: R hemi; R inattention Restrictions Weight Bearing Restrictions: No Pain: Pain Assessment Faces Pain Scale: No hurt  Therapy/Group: Individual Therapy  EValma Cava5/02/2020, 1:26 PM

## 2019-09-24 ENCOUNTER — Inpatient Hospital Stay (HOSPITAL_COMMUNITY): Payer: Medicare Other | Admitting: Speech Pathology

## 2019-09-24 ENCOUNTER — Inpatient Hospital Stay (HOSPITAL_COMMUNITY): Payer: Medicare Other

## 2019-09-24 ENCOUNTER — Inpatient Hospital Stay (HOSPITAL_COMMUNITY): Payer: Medicare Other | Admitting: Occupational Therapy

## 2019-09-24 LAB — GLUCOSE, CAPILLARY
Glucose-Capillary: 127 mg/dL — ABNORMAL HIGH (ref 70–99)
Glucose-Capillary: 97 mg/dL (ref 70–99)
Glucose-Capillary: 119 mg/dL — ABNORMAL HIGH (ref 70–99)
Glucose-Capillary: 100 mg/dL — ABNORMAL HIGH (ref 70–99)

## 2019-09-24 MED ORDER — METHYLPHENIDATE HCL 5 MG PO TABS
2.5000 mg | ORAL_TABLET | Freq: Two times a day (BID) | ORAL | Status: DC
  Administered 2019-09-24 – 2019-09-26 (×4): 2.5 mg via ORAL
  Filled 2019-09-24 (×5): qty 1

## 2019-09-24 MED ORDER — ESCITALOPRAM OXALATE 10 MG PO TABS
5.0000 mg | ORAL_TABLET | Freq: Every day | ORAL | Status: DC
  Administered 2019-09-24 – 2019-10-06 (×13): 5 mg via ORAL
  Filled 2019-09-24 (×13): qty 1

## 2019-09-24 MED ORDER — SODIUM CHLORIDE 0.9 % IV SOLN
INTRAVENOUS | Status: DC
  Filled 2019-09-24 (×11): qty 1000

## 2019-09-24 NOTE — Patient Care Conference (Signed)
Inpatient RehabilitationTeam Conference and Plan of Care Update Date: 09/24/2019   Time: 12:28 PM    Patient Name: David [name]      Medical Record Number: [id number]  Date of Birth: 11-Aug-1923 Sex: Male         Room/Bed: 4W06C/4W06C-01 Payor Info: Payor: MEDICARE / Plan: MEDICARE PART A AND B / Product Type: *No Product type* /    Admit Date/Time:  09/09/2019  9:40 PM  Primary Diagnosis:  ICH (intracerebral hemorrhage) ([place])  Patient Active Problem List   Diagnosis Date Noted  . Labile blood pressure   . Dysphagia, post-stroke   . AKI (acute kidney injury) ([place])   . Labile blood glucose   . Coronary artery disease involving native coronary artery of native heart without angina pectoris   . Chronic obstructive pulmonary disease ([place])   . Tremors of nervous system   . S/P AVR   . Thrombocytopenia ([place])   . Global aphasia   . ICH (intracerebral hemorrhage) ([place]) 09/04/2019  . Tremor 05/20/2019  . 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 Chest pain, atypical 09/20/2018  . Onychomycosis 06/06/2018  . Acute pain of left knee 08/18/2017  . Wart 06/05/2017  . Acute upper respiratory infection 05/12/2016  . Skin irritation from shaving 12/02/2015  . Benign paroxysmal positional vertigo 03/09/2015  . Plantar fasciitis, bilateral 12/02/2014  . Constipation 07/17/2014  . Cough 04/09/2014  . S/P AVR (aortic valve replacement) 11/04/2013  . Elevated hemoglobin ([place]) 12/14/2012  . Well adult exam 11/12/2012  . 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 Impaired glucose tolerance 02/14/2011  . Edema 10/22/2010  . Actinic keratoses 10/22/2010  . THROMBOCYTOPENIA 01/22/2010  . MELANOMA 03/05/2009  . TOBACCO USE, QUIT 02/27/2009  . Neoplasm of uncertain behavior of skin 05/08/2007  . Dyslipidemia 02/08/2007  . Essential hypertension 02/08/2007  . Coronary atherosclerosis 02/08/2007  . COPD 02/08/2007  . GERD 02/08/2007  . OSTEOARTHRITIS 02/08/2007  . LOW BACK PAIN 02/08/2007     Expected Discharge Date: Expected Discharge Date: 10/08/19  Team Members Present: Physician leading conference: Dr. Alger Simons Care Coodinator Present: Erlene Quan, BSW Nurse Present: Dorthula Nettles, RN;Karen Lovena Neighbours, RN PT Present: Apolinar Junes, PT OT Present: Cherylynn Ridges, OT SLP Present: Weston Anna, SLP PPS Coordinator present : Ileana Ladd, Burna Mortimer, SLP     Current Status/Progress Goal Weekly Team Focus  Bowel/Bladder   Pt in incontinent of B/B, LBM 09/19/19- PRN lactlouse will be given  Maintain normal bowel pattern  Q2h toileting/PRN   Swallow/Nutrition/ Hydration   Dys. 1 textures with hone-thick liquids, Supervision-Min A  Supervision  trial tray of Dys. 2 textures   ADL's   Max A +2, can be Max of 1 at times, but inconsistent  min/mod A overall  self-care retraining, activity tolerance, R UE NMR, NMES, R attention, initiation, cognitive retraining   Mobility   min-max A rolling, mod-max of 1 person supine<>sit, supervision to mod A for sitting balance, mod +2 to max +1 lateral scoot to L, max +2 to the R, mod A [place] with second assist for transport due to R lean, mod A sit<>stand at L rail, mod-max A +1 and w/c follow for gait 29 ft with L rail and DF wrap and max-total A for R LE advancement  Min assist transfers; supervision w/c; mod assist car transfer and gait 20 ft  Bed mobility, sitting balance/reorientation to midline, transfers, standing balance/orientation, gait training, w/c mobility as able, cognitive remediation, R NMR, patient/caregiver education   Communication  Mod A  Min A  use of speech intelligibility strategies   Safety/Cognition/ Behavioral Observations  Mod-Max A  Min A  basic problem solving, orientation, attention   Pain   pt c/o of headache, PRN tylenol effective  pt will be free of pain  Assess pain qshift/prn   Skin   Has MASD on buttock, bruising to bilateral upper extremeties.  To prevent skin from further  breakdown/infection  Assess skin qshift/prn    Rehab Goals Patient on target to meet rehab goals: Yes Rehab Goals Revised: Patient on target with goals *See Care Plan and progress notes for long and short-term goals.     Barriers to Discharge  Current Status/Progress Possible Resolutions Date Resolved   Nursing                  PT  Decreased caregiver support;Lack of/limited family support;Behavior  Patient's daughter to be main caregiver, can only provide light min A at d/c; patient limited by aphasia, dysarthria for communication, R inattention, decreased orientation (intermittent), decreased spacial awareness, and dense R hemiplegia  Started conversation of SNF vs home at d/c due to decreased level of assist available from family; cognition fluctuates, but overall improving, minimal R LE muscle activation in synigistic pattern, no R UE activation           OT                  SLP                SW Medical stability;Lack of/limited family support Daughter to provide care inside home. SW not aware of any barriers currently on target          Discharge Planning/Teaching Needs:  Plans to discharge home with daughter to provide care  Will schedule if recommended   Team Discussion: Starting Lexapro for early signs of depression.    Revisions to Treatment Plan: SLP trialing DYS2 diet today. IV fluids changing to nightly fluids only.     Medical Summary Current Status: still lethargic at times. waxes and wanes. intake marginal. still on ivf for fluids Weekly Focus/Goal: level of arousal, nutrition, mood, pain mgt  Barriers to Discharge: Medical stability   Possible Resolutions to Barriers: see details of medical plan in md progress notes   Continued Need for Acute Rehabilitation Level of Care: The patient requires daily medical management by a physician with specialized training in physical medicine and rehabilitation for the following reasons: Direction of a multidisciplinary  physical rehabilitation program to maximize functional independence : Yes Medical management of patient stability for increased activity during participation in an intensive rehabilitation regime.: Yes Analysis of laboratory values and/or radiology reports with any subsequent need for medication adjustment and/or medical intervention. : Yes   I attest that I was present, lead the team conference, and concur with the assessment and plan of the team.   Cristi Loron 09/24/2019, 12:28 PM

## 2019-09-24 NOTE — Progress Notes (Signed)
[facility] PHYSICAL MEDICINE & REHABILITATION PROGRESS NOTE   Subjective/Complaints: Pt lying in bed. Slow to arouse today. May not have slept too well last night  ROS: Limited due to cognitive/behavioral     Objective: CT HEAD WO CONTRAST  Result Date: 09/23/2019 CLINICAL DATA:  Worsening confusion and speech; speech difficulty. EXAM: CT HEAD WITHOUT CONTRAST TECHNIQUE: Contiguous axial images were obtained from the base of the skull through the vertex without intravenous contrast. COMPARISON:  Head CT 09/10/2019 FINDINGS: Brain: There has been an interval decrease in size and conspicuity of a parenchymal hemorrhage centered within the posterior left basal ganglia. Remaining hyperdense hemorrhage at this site now measures 3.1 x 1.8 cm in transaxial dimensions (previously 4.4 x 2.5 cm). Surrounding edema has not significantly changed. Previously demonstrated small volume hemorrhage within the occipital horns of the lateral ventricles is no longer appreciable. Redemonstrated chronic bilateral basal ganglia lacunar infarcts. Stable background chronic small vessel ischemic disease within the cerebral white matter. Mild generalized parenchymal atrophy. There is no hydrocephalus. No midline shift or extra-axial fluid collection. No demarcated cortical infarct. Vascular: No hyperdense vessel. Atherosclerotic calcifications. Skull: Normal. Negative for fracture or focal lesion. Sinuses/Orbits: Visualized orbits show no acute finding. Mild ethmoid sinus mucosal thickening. No significant mastoid effusion. IMPRESSION: 1. Interval decrease in size of a left basal ganglia parenchymal hemorrhage as compared to head CT 09/10/2019. Remaining hyperdense hemorrhage now measures 3.1 x 1.8 cm in transaxial dimensions. Associated edema has not significantly changed. 2. Previously demonstrated small volume intraventricular hemorrhage is no longer appreciable. 3. No interval intracranial abnormality is identified. 4.  Stable background generalized parenchymal atrophy and chronic small vessel ischemic disease. Electronically Signed   By: Kellie Simmering DO   On: 09/23/2019 16:46   Recent Labs    09/23/19 0534  WBC 7.5  HGB 15.9  HCT 47.8  PLT 122*   Recent Labs    09/23/19 0534  NA 140  K 4.1  CL 107  CO2 23  GLUCOSE 100*  BUN 10  CREATININE 1.11  CALCIUM 9.1    Intake/Output Summary (Last 24 hours) at 09/24/2019 1023 Last data filed at 09/24/2019 [id number] Gross per 24 hour  Intake 1287.53 ml  Output --  Net 1287.53 ml     Physical Exam: Vital Signs Blood pressure (!) 164/81, pulse 62, temperature 97.7 F (36.5 C), temperature source Oral, resp. rate 18, height 5\' 11"  (1.803 m), weight 104.5 kg, SpO2 96 %. Constitutional: No distress . Vital signs reviewed. HEENT: EOMI, oral membranes moist Neck: supple Cardiovascular: RRR without murmur. No JVD    Respiratory/Chest: CTA Bilaterally without wheezes or rales. Normal effort    GI/Abdomen: BS +, non-tender, non-distended Ext: no clubbing, cyanosis, or edema Psych: limited engagement, does interact with tactile and verbal cues.  Musc: No edema in extremities.  No tenderness in extremities. Neurological:   Aphasic, dysarthric, eyes generally closed today.  Flexor tone RUE, extensor tone RLE a little better.  Skin: numerous bruises/lacs on arms all stable.  Small area redness near sacrum/buttocks  --these areas stable  Assessment/Plan: 1. Functional deficits secondary to left BG ICH which require 3+ hours per day of interdisciplinary therapy in a comprehensive inpatient rehab setting.  Physiatrist is providing close team supervision and 24 hour management of active medical problems listed below.  Physiatrist and rehab team continue to assess barriers to discharge/monitor patient progress toward functional and medical goals  Care Tool:  Bathing        Body parts bathed  by helper: Front perineal area, Buttocks     Bathing assist Assist  Level: 2 Helpers     Upper Body Dressing/Undressing Upper body dressing Upper body dressing/undressing activity did not occur (including orthotics): N/A What is the patient wearing?: Hospital gown only    Upper body assist Assist Level: Dependent - Patient 0%    Lower Body Dressing/Undressing Lower body dressing      What is the patient wearing?: Incontinence brief, Pants     Lower body assist Assist for lower body dressing: Dependent - Patient 0%     Toileting Toileting    Toileting assist Assist for toileting: 2 Helpers     Transfers Chair/bed transfer  Transfers assist  Chair/bed transfer activity did not occur: Safety/medical concerns  Chair/bed transfer assist level: Maximal Assistance - Patient 25 - 49% Chair/bed transfer assistive device: Other(bed rail to the L)   Locomotion Ambulation   Ambulation assist   Ambulation activity did not occur: Safety/medical concerns  Assist level: Maximal Assistance - Patient 25 - 49% Assistive device: Other (comment)(L rail) Max distance: 29'   Walk 10 feet activity   Assist  Walk 10 feet activity did not occur: Safety/medical concerns  Assist level: Maximal Assistance - Patient 25 - 49% Assistive device: Other (comment)(L rail)   Walk 50 feet activity   Assist Walk 50 feet with 2 turns activity did not occur: Safety/medical concerns         Walk 150 feet activity   Assist Walk 150 feet activity did not occur: Safety/medical concerns         Walk 10 feet on uneven surface  activity   Assist Walk 10 feet on uneven surfaces activity did not occur: Safety/medical concerns         Wheelchair     Assist Will patient use wheelchair at discharge?: Yes Type of Wheelchair: Manual Wheelchair activity did not occur: Safety/medical concerns(lethargy; TIS would be dependent)         Wheelchair 50 feet with 2 turns activity    Assist            Wheelchair 150 feet activity      Assist          Blood pressure (!) 164/81, pulse 62, temperature 97.7 F (36.5 C), temperature source Oral, resp. rate 18, height 5\' 11"  (1.803 m), weight 104.5 kg, SpO2 96 %.  Medical Problem List and Plan: 1.Functional, mobility and cognitive deficitssecondary to left basal ganglia hemorrhage  -recent CT with sl increase in hematoma size/surrounding edema  Continue CIR PT, OT, SLP   -therapies 15/7  5/9: ongoing/increased tone Right side. Not a candidate d/t lethargy for oral antispasmodics.    -PRAFO/AFO, ROM       -arousal still an issue often. CT of head is stable to improved in appearance.  2. Antithrombotics: -DVT/anticoagulation:Mechanical:Sequential compression devices, below kneeBilateral lower extremities--platelets stable to increased 90k -antiplatelet therapy: N/A 3.Chronic LBP/Pain Management:Tylenol prn. 4. Mood:LCSW to follow for evaluation and support.  -?depression. Spoke with daughter who felt it might be an issue. Will initiate low dose lexapro. -antipsychotic agents: N/A 5. Neuropsych: This patientis notcapable of making decisions on hisown behalf.  -  Amantadine increased to 100mg  bid on 5/7---bp/hr ok 5/8 6. Skin/Wound Care:Routine pressure relief measures. 7. Fluids/Electrolytes/Nutrition:resumed po diet  -IV infiltrated last night 5/7   5/7 bun/cr ok, prealbumin 22.6   5/11 bmet reviewed from 5/10 and stable. Change IVF to HS 8. HTN:  SBP goal<140--added prn catapres  -  resumed low dose tenormin 5/5--> continue for now. Some elevation 5/11 9.H/oCOPD:Stable without meds. 10. Impaired glucose tolerance:CBG's wnl so far.  - SSI as indicated.  Controlled on 5/11   11. CAD/AVR:Off ASA due to bleed. No statin due to intolerance. Tenormin, cozaar and lasix (used prn) on hold.  -no TEE indicated 12. Thrombocytopenia:   Platelets decreased to 122 on 5/11. Continue to monitor regularly.  13. AKI:  Creatinine 1.11 on 5/11, continue to monitor weekly.  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  Advance diet as tolerated  See #7 15. Constipation: Senna at bedtime        LOS: 15 days A FACE TO FACE EVALUATION WAS PERFORMED  Meredith Staggers 09/24/2019, 10:23 AM

## 2019-09-24 NOTE — Progress Notes (Signed)
Team Conference Report to Patient/Family  Team Conference discussion was reviewed with the patient and caregiver, including goals, any changes in plan of care and target discharge date.  Patient and caregiver express understanding and are in agreement.  The patient has a target discharge date of 10/08/19. Sw had provided [place] and SNF information to the daughters to discuss. Daughters will follow up with SW with their decision.   Dyanne Iha 09/24/2019, 1:25 PM

## 2019-09-24 NOTE — Progress Notes (Signed)
Occupational Therapy Session Note  Patient Details  Name: [name] MRN: [id number] Date of Birth: 01/11/1924  Today's Date: 09/24/2019 OT Individual Tim3: 8828-0034 OT Individual Time Calculation (min): 45 min   Short Term Goals: Week 3:  OT Short Term Goal 1 (Week 3): Pt will transfer with assist of 1 person. OT Short Term Goal 2 (Week 3): Pt will maintain sitting balance with no more than mod A within BADL tasks OT Short Term Goal 3 (Week 3): Pt will assist with pulling pant up in bed OT Short Term Goal 4 (Week 3): Pt will attend to R UE with mod verbal cues within BADL tasks  Skilled Therapeutic Interventions/Progress Updates:    Pt greeted semi-reclined in bed with daughter present. Pt initially stated " I can't even lift my head off the pillow," but agreeable to OT treatment session with encouragement. Pt also needed to get OOB so nursing team could exchange high-low bed for standard bed. Pt noted to be incontinent of urine, so brief changed with max A +2. Max+2 to then don pants in supine with OT assist for R LE positioning into knee flexion to then bridge hips to pull up pants. Pt with strong extensor tone in R LE this afternoon making it difficult to get R knee and hip into flexion. Pt brought to sitting EOB with max A +2. Pt was able to sit at EOB with supervision for a few minutes today! When OT then had pt try to put his shirt on, he began loosing his balance and needed min/mod A to maintain sitting balance while donning shirt. Mod A +2 stand-pivot from bed to wc on stronger L side. OT brought pt to dayroom while nurse tech changed out new bed. Worked on attention to R UE and self-ROM. Pt completed 10 arm raises with OT support at R elbow to decrease strain on L UE. Pt with improved overall attention to R UE throughout session. Gentle massage to pecs, then ROM by OT to decrease tone. Pt left seated in TIS wc at end of session with daughters present, alarm belt on, and needs met.    Therapy Documentation Precautions:  Precautions Precautions: Fall Precaution Comments: R hemi; R inattention Restrictions Weight Bearing Restrictions: No Pain:  denies pain, reports fatigue Therapy/Group: Individual Therapy  Valma Cava 09/24/2019, 3:06 PM

## 2019-09-24 NOTE — Progress Notes (Signed)
Nutrition Follow-up  DOCUMENTATION CODES:   Obesity unspecified  INTERVENTION:   - Snacks TID between meals  -MagicCup TID with meals, each supplement provides 290 kcal and 9 grams of protein  - Encourage adequate PO intake and provide feeding assistance  NUTRITION DIAGNOSIS:   Inadequate oral intake related to dysphagia, lethargy/confusion as evidenced by meal completion < 25%.  Progressing  GOAL:   Patient will meet greater than or equal to 90% of their needs  Progressing  MONITOR:   Supplement acceptance, Diet advancement, Labs, PO intake, Weight trends  REASON FOR ASSESSMENT:   Malnutrition Screening Tool    ASSESSMENT:   86 year old male with PMH of CAD, COPD, LBP, tremors, AVR. Pt was admitted on 09/04/19 with right facial weakness, dysarthria, right hemiparesis, and left gaze preference. CT head done showing acute left ICH. 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 Pt with resultant dysphagia, aphasia, delayed processing. Pt admitted to CIR on 4/26.  Spoke with pt's daughter [name] at bedside. [name] reports that pt is eating better at meals and really enjoys the thickened milk with chocolate syrup mixed in. [name] also reports that they have added chocolate syrup to ice cream along with thickener and that pt has enjoyed that. Pt is not receiving foods that he dislikes on his meal trays. Pt's daughter denies any nutrition-related concerns at this time.  Meal Completion: 0-60% x last 8 meals  Medications reviewed and include: SSI, Ritalin, protonix, senna IVF: NS with KCl @ 50 ml/hr q HS  Labs reviewed. CBG's: 97-157 x 24 hours  Diet Order:   Diet Order            DIET - DYS 1 Room service appropriate? Yes; Fluid consistency: Honey Thick  Diet effective now              EDUCATION NEEDS:   No education needs have been identified at this  time  Skin:  Skin Assessment: Reviewed RN Assessment  Last BM:  09/22/19 large type 6  Height:   Ht Readings from Last 1 Encounters:  09/23/19 5\' 11"  (1.803 m)    Weight:   Wt Readings from Last 1 Encounters:  09/23/19 104.5 kg    Ideal Body Weight:  78.2 kg  BMI:  Body mass index is 32.14 kg/m.  Estimated Nutritional Needs:   Kcal:  1800-2000  Protein:  95-110 grams  Fluid:  1.8-2.0 L    Gaynell Face, MS, RD, LDN Inpatient Clinical Dietitian Pager: [phone] Weekend/After Hours: [phone]

## 2019-09-24 NOTE — Progress Notes (Signed)
Speech Language Pathology Daily Session Note  Patient Details  Name: David [name] MRN: [id number] Date of Birth: December 30, 1923  Today's Date: 09/24/2019  Session 1: SLP Individual Time: [id number] SLP Individual Time Calculation (min): 30 min   Session 2: SLP Individual Time: 12:55-13:15 SLP Individual Time Calculation (min): 20  min  Short Term Goals: Week 2: SLP Short Term Goal 1 (Week 2): Patient will utilize speech intelligibility strategies at the phrase level to achieve ~75% intelligibility with Max verbal cues. SLP Short Term Goal 2 (Week 2): Patient will verbalize wants/needs at the phrase level with Mod verbal cues. SLP Short Term Goal 3 (Week 2): Patient will follow 2 step commands with 75% accuracy and Max verbal and visual cues. SLP Short Term Goal 4 (Week 2): Patient will demonstrate sustained attention to functional tasks for 15 minutes with Mod verbal cues for redirection. SLP Short Term Goal 5 (Week 2): Patient will demonstrate orientation to place, time and situation with Max A multimodal cues. SLP Short Term Goal 6 (Week 2): Patient will conusme current diet with minimal overt s/s of aspiration with Min verbal cues for use of swallowing compensatory strategies.  Skilled Therapeutic Interventions:   Session 1: Skilled treatment session focused on communication goals. SLP facilitated session by providing extra time and overall Min A verbal cues for use of speech intelligibility strategies at the phrase level to achieve ~90% intelligibility. Patient appeared lethargic today and was perseverative on certain topics with decreased ability to make simple decisions ("whatever you want"). Spoke with PA who was agreeable in switching out patient's high-low bed to a normal hospital bed in hopes of maximizing comfort and overall sleep at night. Patient left upright in bed with daughter present and all needs within reach. Continue with current plan of care.   Session 2: Skilled treatment  session focused on dysphagia goals. SLP facilitated session by providing Max encouragement for PO intake with trial tray of Dys. 2 textures. Patient initially declined but was agreeable to one bite with encouragement. Patient attempted to masticate Dys. 2 textures but reported he was unable to swallow it and had to orally expectorate the bolus. Patient reported, he "has no energy for anything." Suspect trials would have been more appropriate if patient was more alert. Recommend patient continue current diet of Dys. 1 textures with ongoing trials of Dys. 2 textures with SLP only. Patient and daughter verbalized understanding. Patient left upright in bed with alarm on and all needs within reach. Continue with current plan of care.      Pain Pain Assessment Pain Scale: 0-10 Pain Score: 5  Pain Type: Acute pain Pain Location: Head Pain Descriptors / Indicators: Headache Pain Frequency: Intermittent Pain Onset: Unable to tell Pain Intervention(s): Medication (See eMAR)  Therapy/Group: Individual Therapy  Kaeli Nichelson 09/24/2019, 8:36 AM

## 2019-09-24 NOTE — Progress Notes (Signed)
Physical Therapy Weekly Progress Note  Patient Details  Name: David [name] MRN: [id number] Date of Birth: Sep 24, 1923  Beginning of progress report period: Sep 17, 2019 End of progress report period: Sep 24, 2019  Today's Date: 09/24/2019 PT Individual Time: 0900-0915 and 1000-1030 PT Individual Time Calculation (min): 15 min and 30 min  Patient has met 3 of 4 short term goals. He is making steady progress with PT. He currently requires min-max A rolling, mod-max of 1 person supine<>sit, supervision to mod A for sitting balance, mod +2 to max +1 lateral scoot transfers to the L, max +2 to the R, mod A Stedy transfers with second assist for transport due to R lean, mod A sit<>stand at L rail, mod-max A +1 and w/c follow for gait 29 ft with L rail and DF wrap and max-total A for R LE advancement. Patient continues to be limited by aphasia and dysarthria for effective communication, R inattention, decreased orientation (intermittent), decreased spacial awareness, and dense R hemiplegia (minimal R LE muscle activation in synigistic pattern, no R UE activation). Started conversation with patient's daughters about SNF vs home at d/c due to decreased level of assist available from family at d/c.  Patient continues to demonstrate the following deficits muscle weakness and muscle joint tightness, decreased cardiorespiratoy endurance, impaired timing and sequencing, abnormal tone, unbalanced muscle activation, decreased coordination and decreased motor planning, decreased midline orientation, decreased attention to right and decreased motor planning, decreased initiation, decreased awareness, decreased problem solving, decreased safety awareness, decreased memory and delayed processing and decreased sitting balance, decreased standing balance, decreased postural control, hemiplegia and decreased balance strategies and therefore will continue to benefit from skilled PT intervention to increase functional  independence with mobility.  Patient progressing toward long term goals..  Continue plan of care.  PT Short Term Goals Week 2:  PT Short Term Goal 1 (Week 2): Patient will perfrom bed mobility with mod A consistently. PT Short Term Goal 1 - Progress (Week 2): Progressing toward goal PT Short Term Goal 2 (Week 2): Patient will perform transfers with mod A +2. PT Short Term Goal 2 - Progress (Week 2): Met PT Short Term Goal 3 (Week 2): Patient will perform sit to stand with mod A +2. PT Short Term Goal 3 - Progress (Week 2): Met PT Short Term Goal 4 (Week 2): Patient will initiate pre-gait activities. PT Short Term Goal 4 - Progress (Week 2): Met Week 3:  PT Short Term Goal 1 (Week 3): Patient will perform bed mobility with mod A consistently with use of bed rails. PT Short Term Goal 2 (Week 3): Patient will perform basic transfers with mod A. PT Short Term Goal 3 (Week 3): Patient will initiate car transfer training. PT Short Term Goal 4 (Week 3): Patient will ambulate >10 ft with max A using functional AD (not rail).  Skilled Therapeutic Interventions/Progress Updates:     Session 1: Patient's daughter, [name], requested to speak to me about d/c planning. Patient and [name] in the room, patient resting during discussion/education. [name] expressed concerns about her sister caring for her father at d/c. Discussed barriers and physical limitations that may be challenging at d/c, even with patient at a total A, lift transfer, level at home. Educated on SNF follow-up to allow increased time for patient to reach a higher level of function and family to make arrangements at home for safe care for patient and caregiver at d/c.  Session 2: Patient in bed asleep with his  daughter, [name], at bedside upon PT arrival. Patient easily aroused and agreeable to bed level PT session due to fatigue from poor sleep last night. Patient denied pain during session and showed no visible signs of pain throughout session.  Focused session on R UE/LE motor control, attention, and stretching to reduce tone with patient in supine.  Neuromuscular Re-ed: -R LE hip flexion/extension slow PROM with gentle OP to reduce extensor tone, improved ROM and tone this session reaching>90 degrees knee flexion and ~90 degrees hip flexion -R LE hip flexion/extension rhythmic initiation progressing to intermittent AAROM -R heel cord stretch 2x30 sec -R shoulder ER/IR slow PROM with gentle OP to reduce extensor tone, approximately 45 degrees -R elbow flexion/extension slow PROM with gentle OP to reduce extensor tone throughout range, initiated rhythmic initiation, however, patient unable to progress past passive ROM -R shoulder flexion with scapular stabilization slow PROM with gentle OP to reduce extensor tone to ~45 degrees  Patient in bed with his daughter at bedside at end of session with breaks locked, bed alarm set, and all needs within reach.    Therapy Documentation Precautions:  Precautions Precautions: Fall Precaution Comments: R hemi; R inattention Restrictions Weight Bearing Restrictions: No   Therapy/Group: Individual Therapy  Mckinzy Fuller L Krishay Faro PT, DPT  09/24/2019, 12:34 PM

## 2019-09-25 ENCOUNTER — Inpatient Hospital Stay (HOSPITAL_COMMUNITY): Payer: Medicare Other | Admitting: Speech Pathology

## 2019-09-25 ENCOUNTER — Inpatient Hospital Stay (HOSPITAL_COMMUNITY): Payer: Medicare Other | Admitting: Occupational Therapy

## 2019-09-25 ENCOUNTER — Encounter (HOSPITAL_COMMUNITY): Payer: Medicare Other | Admitting: Psychology

## 2019-09-25 ENCOUNTER — Inpatient Hospital Stay (HOSPITAL_COMMUNITY): Payer: Medicare Other

## 2019-09-25 LAB — GLUCOSE, CAPILLARY
Glucose-Capillary: 98 mg/dL (ref 70–99)
Glucose-Capillary: 119 mg/dL — ABNORMAL HIGH (ref 70–99)
Glucose-Capillary: 115 mg/dL — ABNORMAL HIGH (ref 70–99)
Glucose-Capillary: 143 mg/dL — ABNORMAL HIGH (ref 70–99)

## 2019-09-25 NOTE — Progress Notes (Signed)
Physical Therapy Session Note  Patient Details  Name: David [name] MRN: [id number] Date of Birth: [birth year]  Today's Date: 09/25/2019 PT Individual Time: 1000-1034 PT Individual Time Calculation (min): 34 min   Short Term Goals: Week 3:  PT Short Term Goal 1 (Week 3): Patient will perform bed mobility with mod A consistently with use of bed rails. PT Short Term Goal 2 (Week 3): Patient will perform basic transfers with mod A. PT Short Term Goal 3 (Week 3): Patient will initiate car transfer training. PT Short Term Goal 4 (Week 3): Patient will ambulate >10 ft with max A using functional AD (not rail).  Skilled Therapeutic Interventions/Progress Updates:     Patient in bed with his daughter in the room upon PT arrival. Patient alert and agreeable to PT session. Patient denied pain during session and displayed no signs of pain or distress throughout session. Patient initially confused at beginning of session. Re-oriented patient and confusion improved once patient in sitting. Noted increased tone in L LE compared to yesterday.   Therapeutic Activity: Bed Mobility: Patient performed rolling R with CGA with use of bed rail and L with max A for LE and trunk management and hand-over-hand for patient to bring R UE using L across his body. Donned pants with total a during rolling. He performed supine to L side-lying to sit with mod A and max A for scooting R hip forward. Provided verbal cues for sequencing, and pushing up with L UE to sitting and required increased time for initiation. Transfers: Attempted to have patient stand from the bed with max of 1 person with patient pushing up from bed rail on the L, however, patient unable to follow cues and attempting to pivot to chair before coming to standing. Patient performed a squat pivot transfer to the L from bed>TIS w/c with max A of 1 person and patient's daughter stabilizing TIS w/c. Provided cues for hand placement and head-hips relationship for  proper technique and decreased assist with transfers.   Neuromuscular Re-ed: Patient performed the following sitting balance, trunk control, and R NMR/attention activities: -static sitting balance progressing from mod-supervision x5 min, focused on erect posture and looking ahead without UE support -dynamic sitting balance to doff/don shirt with supervision for trunk support to doff shirt and min-mod A for trunk support to don shirt, performed dressing tasks with max A, patient able to thread L UE out/in shirt sleeve with set-up and assist with pulling shirt over his head. Had difficulty attending to R UE to assist with donning/doffing shirt sleeve -lateral leans to the R x5 with approximation and shoulder and hand for increased joint and proprioceptive feed back, required mod A for leaning down to his R elbow and max A for pushing to sit up with R UE, noted proximal muscle activation x1  Patient in TIS w/c with his daughter in the room at end of session with breaks locked, seat belt alarm set, and all needs within reach. Patient's daughter participated in hands on training for rolling, dressing and positioning throughout session.   Therapy Documentation Precautions:  Precautions Precautions: Fall Precaution Comments: R hemi; R inattention Restrictions Weight Bearing Restrictions: No    Therapy/Group: Individual Therapy  Chloee Tena L Sheray Grist PT, DPT  09/25/2019, 10:54 AM

## 2019-09-25 NOTE — Progress Notes (Signed)
[place] PHYSICAL MEDICINE & REHABILITATION PROGRESS NOTE   Subjective/Complaints: Pt lying in bed comfortably with his daughter at bedside. He appears more alert than prior but is still having difficulty following basic commands and answering basic questions.  Daughter has no additional concerns today  ROS: Limited due to cognitive/behavioral   Objective: CT HEAD WO CONTRAST  Result Date: 09/23/2019 CLINICAL DATA:  Worsening confusion and speech; speech difficulty. EXAM: CT HEAD WITHOUT CONTRAST TECHNIQUE: Contiguous axial images were obtained from the base of the skull through the vertex without intravenous contrast. COMPARISON:  Head CT 09/10/2019 FINDINGS: Brain: There has been an interval decrease in size and conspicuity of a parenchymal hemorrhage centered within the posterior left basal ganglia. Remaining hyperdense hemorrhage at this site now measures 3.1 x 1.8 cm in transaxial dimensions (previously 4.4 x 2.5 cm). Surrounding edema has not significantly changed. Previously demonstrated small volume hemorrhage within the occipital horns of the lateral ventricles is no longer appreciable. Redemonstrated chronic bilateral basal ganglia lacunar infarcts. Stable background chronic small vessel ischemic disease within the cerebral white matter. Mild generalized parenchymal atrophy. There is no hydrocephalus. No midline shift or extra-axial fluid collection. No demarcated cortical infarct. Vascular: No hyperdense vessel. Atherosclerotic calcifications. Skull: Normal. Negative for fracture or focal lesion. Sinuses/Orbits: Visualized orbits show no acute finding. Mild ethmoid sinus mucosal thickening. No significant mastoid effusion. IMPRESSION: 1. Interval decrease in size of a left basal ganglia parenchymal hemorrhage as compared to head CT 09/10/2019. Remaining hyperdense hemorrhage now measures 3.1 x 1.8 cm in transaxial dimensions. Associated edema has not significantly changed. 2. Previously  demonstrated small volume intraventricular hemorrhage is no longer appreciable. 3. No interval intracranial abnormality is identified. 4. Stable background generalized parenchymal atrophy and chronic small vessel ischemic disease. Electronically Signed   By: Kellie Simmering DO   On: 09/23/2019 16:46   Recent Labs    09/23/19 0534  WBC 7.5  HGB 15.9  HCT 47.8  PLT 122*   Recent Labs    09/23/19 0534  NA 140  K 4.1  CL 107  CO2 23  GLUCOSE 100*  BUN 10  CREATININE 1.11  CALCIUM 9.1    Intake/Output Summary (Last 24 hours) at 09/25/2019 0917 Last data filed at 09/25/2019 0400 Gross per 24 hour  Intake 825.74 ml  Output --  Net 825.74 ml     Physical Exam: Vital Signs Blood pressure (!) 157/67, pulse 67, temperature 98.4 F (36.9 C), temperature source Oral, resp. rate 18, height 5\' 11"  (1.803 m), weight 104.5 kg, SpO2 95 %. Constitutional: No distress . Vital signs reviewed. HEENT: EOMI, oral membranes moist Neck: supple Cardiovascular: RRR without murmur. No JVD    Respiratory/Chest: CTA Bilaterally without wheezes or rales. Normal effort    GI/Abdomen: BS +, non-tender, non-distended Ext: no clubbing, cyanosis, or edema Psych: limited engagement, does interact with tactile and verbal cues.  Musc: No edema in extremities.  No tenderness in extremities. Neurological:   Aphasic, dysarthric, unable to follow basic commands or answer basic questions; tangential.  Flexor tone RUE, extensor tone RLE a little better.  Skin: numerous bruises/lacs on arms all stable.  Small area redness near sacrum/buttocks --these areas stable  Assessment/Plan: 1. Functional deficits secondary to left BG ICH which require 3+ hours per day of interdisciplinary therapy in a comprehensive inpatient rehab setting.  Physiatrist is providing close team supervision and 24 hour management of active medical problems listed below.  Physiatrist and rehab team continue to assess barriers to  discharge/monitor patient progress toward functional and medical goals  Care Tool:  Bathing        Body parts bathed by helper: Front perineal area, Buttocks     Bathing assist Assist Level: 2 Helpers     Upper Body Dressing/Undressing Upper body dressing Upper body dressing/undressing activity did not occur (including orthotics): N/A What is the patient wearing?: Hospital gown only    Upper body assist Assist Level: Dependent - Patient 0%    Lower Body Dressing/Undressing Lower body dressing      What is the patient wearing?: Incontinence brief, Pants     Lower body assist Assist for lower body dressing: Dependent - Patient 0%     Toileting Toileting    Toileting assist Assist for toileting: 2 Helpers     Transfers Chair/bed transfer  Transfers assist  Chair/bed transfer activity did not occur: Safety/medical concerns  Chair/bed transfer assist level: Maximal Assistance - Patient 25 - 49% Chair/bed transfer assistive device: Other(bed rail to the L)   Locomotion Ambulation   Ambulation assist   Ambulation activity did not occur: Safety/medical concerns  Assist level: Maximal Assistance - Patient 25 - 49% Assistive device: Other (comment)(L rail) Max distance: 29'   Walk 10 feet activity   Assist  Walk 10 feet activity did not occur: Safety/medical concerns  Assist level: Maximal Assistance - Patient 25 - 49% Assistive device: Other (comment)(L rail)   Walk 50 feet activity   Assist Walk 50 feet with 2 turns activity did not occur: Safety/medical concerns         Walk 150 feet activity   Assist Walk 150 feet activity did not occur: Safety/medical concerns         Walk 10 feet on uneven surface  activity   Assist Walk 10 feet on uneven surfaces activity did not occur: Safety/medical concerns         Wheelchair     Assist Will patient use wheelchair at discharge?: Yes Type of Wheelchair: Manual Wheelchair activity did  not occur: Safety/medical concerns(lethargy; TIS would be dependent)         Wheelchair 50 feet with 2 turns activity    Assist            Wheelchair 150 feet activity     Assist          Blood pressure (!) 157/67, pulse 67, temperature 98.4 F (36.9 C), temperature source Oral, resp. rate 18, height 5\' 11"  (1.803 m), weight 104.5 kg, SpO2 95 %.  Medical Problem List and Plan: 1.Functional, mobility and cognitive deficitssecondary to left basal ganglia hemorrhage  -recent CT with sl increase in hematoma size/surrounding edema  Continue CIR PT, OT, SLP   -therapies 15/7  5/9: ongoing/increased tone Right side. Not a candidate d/t lethargy for oral antispasmodics.    -PRAFO/AFO, ROM       -arousal still an issue often. CT of head is stable to improved in appearance.  2. Antithrombotics: -DVT/anticoagulation:Mechanical:Sequential compression devices, below kneeBilateral lower extremities--platelets stable to increased 90k -antiplatelet therapy: N/A 3.Chronic LBP/Pain Management:Tylenol prn.Well controlled.  4. Mood:LCSW to follow for evaluation and support.  -?depression. Spoke with daughter who felt it might be an issue. Will initiate low dose lexapro. -antipsychotic agents: N/A 5. Neuropsych: This patientis notcapable of making decisions on hisown behalf.  -  Amantadine increased to 100mg  bid on 5/7---bp/hr ok 5/8 6. Skin/Wound Care:Routine pressure relief measures. 7. Fluids/Electrolytes/Nutrition:resumed po diet  -IV infiltrated last night 5/7   5/7 bun/cr  ok, prealbumin 22.6   5/11 bmet reviewed from 5/10 and stable. Change IVF to HS 8. HTN:  SBP goal<140--added prn catapres  -resumed low dose tenormin 5/5--> continue for now. Some elevation 5/11  Continues to be elevated to on most reads but has a couple of low reads in past few days. Continue to monitor.  9.H/oCOPD:Stable without meds. 10. Impaired glucose  tolerance:CBG's wnl so far.  - SSI as indicated.  Controlled on 5/12  11. CAD/AVR:Off ASA due to bleed. No statin due to intolerance. Tenormin, cozaar and lasix (used prn) on hold.  -no TEE indicated 12. Thrombocytopenia:   Platelets decreased to 122 on 5/11. Continue to monitor regularly.  13. AKI: Creatinine 1.11 on 5/11, continue to monitor weekly.  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  Advance diet as tolerated  See #7 15. Constipation: Senna at bedtime        LOS: 16 days A FACE TO FACE EVALUATION WAS PERFORMED  Clide Deutscher Dion Sibal 09/25/2019, 9:17 AM

## 2019-09-25 NOTE — Plan of Care (Signed)
  Problem: Consults Goal: RH GENERAL PATIENT EDUCATION Description: See Patient Education module for education specifics. Outcome: Progressing Goal: Skin Care Protocol Initiated - if Braden Score 18 or less Description: Patients skin will be free from break down Outcome: Progressing Goal: Nutrition Consult-if indicated Outcome: Progressing Goal: Diabetes Guidelines if Diabetic/Glucose > 140 Description: If diabetic or lab glucose is > 140 mg/dl - Initiate Diabetes/Hyperglycemia Guidelines & Document Interventions  Outcome: Progressing   Problem: RH BOWEL ELIMINATION Goal: RH STG MANAGE BOWEL WITH ASSISTANCE Description: STG Manage Bowel with mod I Assistance. Outcome: Progressing Goal: RH STG MANAGE BOWEL W/MEDICATION W/ASSISTANCE Description: STG Manage Bowel with Medication with mod I Assistance. Outcome: Progressing   Problem: RH BLADDER ELIMINATION Goal: RH STG MANAGE BLADDER WITH ASSISTANCE Description: STG Manage Bladder With mod I Assistance Outcome: Progressing   Problem: RH SKIN INTEGRITY Goal: RH STG SKIN FREE OF INFECTION/BREAKDOWN Description: Patient will be free from skin break down Outcome: Progressing Goal: RH STG MAINTAIN SKIN INTEGRITY WITH ASSISTANCE Description: STG Maintain Skin Integrity With mod I Assistance. Outcome: Progressing Goal: RH STG ABLE TO PERFORM INCISION/WOUND CARE W/ASSISTANCE Description: STG Able To Perform Incision/Wound Care With mod I Assistance. Outcome: Progressing   Problem: RH SAFETY Goal: RH STG ADHERE TO SAFETY PRECAUTIONS W/ASSISTANCE/DEVICE Description: STG Adhere to Safety Precautions With Assistance/Device. Outcome: Progressing   Problem: RH PAIN MANAGEMENT Goal: RH STG PAIN MANAGED AT OR BELOW PT'S PAIN GOAL Description: Pain scale <3/10 Outcome: Progressing   Problem: RH KNOWLEDGE DEFICIT GENERAL Goal: RH STG INCREASE KNOWLEDGE OF SELF CARE AFTER HOSPITALIZATION Outcome: Progressing   Problem: Consults Goal: RH  STROKE PATIENT EDUCATION Description: See Patient Education module for education specifics  Outcome: Progressing   Problem: RH KNOWLEDGE DEFICIT Goal: RH STG INCREASE KNOWLEDGE OF HYPERTENSION Description: Patient will be able to verbalize anti-hypertensive medication & side effects by discharge Outcome: Progressing Goal: RH STG INCREASE KNOWLEDGE OF DYSPHAGIA/FLUID INTAKE Description: Patient will be able to verbalize need for specialty diet Outcome: Progressing Goal: RH STG INCREASE KNOWLEDGE OF STROKE PROPHYLAXIS Description: Patient will be able to verbalize medications that are for stroke prophylaxis Outcome: Progressing   Problem: Consults Goal: RH STROKE PATIENT EDUCATION Description: See Patient Education module for education specifics  Outcome: Progressing

## 2019-09-25 NOTE — Progress Notes (Signed)
Speech Language Pathology Daily Session Note  Patient Details  Name: David [name] MRN: [id number] Date of Birth: 1924/05/08  Today's Date: 09/25/2019 SLP Individual Time: 1200-1240 SLP Individual Time Calculation (min): 40 min  Short Term Goals: Week 2: SLP Short Term Goal 1 (Week 2): Patient will utilize speech intelligibility strategies at the phrase level to achieve ~75% intelligibility with Max verbal cues. SLP Short Term Goal 2 (Week 2): Patient will verbalize wants/needs at the phrase level with Mod verbal cues. SLP Short Term Goal 3 (Week 2): Patient will follow 2 step commands with 75% accuracy and Max verbal and visual cues. SLP Short Term Goal 4 (Week 2): Patient will demonstrate sustained attention to functional tasks for 15 minutes with Mod verbal cues for redirection. SLP Short Term Goal 5 (Week 2): Patient will demonstrate orientation to place, time and situation with Max A multimodal cues. SLP Short Term Goal 6 (Week 2): Patient will conusme current diet with minimal overt s/s of aspiration with Min verbal cues for use of swallowing compensatory strategies.  Skilled Therapeutic Interventions: Skilled treatment session focused on communication goals. SLP facilitated session by providing Min A verbal cues for use of speech intelligibility at the sentence level during a conversation exchange about familiar pictures. SLP also facilitated session that focused on potential upgrade of textures. Patent declined an upgrade at this time and reported that he wanted to discuss it with this family and didn't think it mattered since "he wont be around much longer." SLP provided support. Patient was ~90% intelligible at the sentence level throughout session. Patient left supine in bed with alarm on and all needs within reach. Continue with current plan of care.      Pain No/Denies Pain   Therapy/Group: Individual Therapy  Haji Delaine, [place] 09/25/2019, 3:59 PM

## 2019-09-25 NOTE — Progress Notes (Signed)
Occupational Therapy Session Note  Patient Details  Name: David [name] MRN: [id number] Date of Birth: February 04, 1924  Today's Date: 09/25/2019 OT Individual Time: 1117-1200 OT Individual Time Calculation (min): 43 min    Short Term Goals: Week 3:  OT Short Term Goal 1 (Week 3): Pt will transfer with assist of 1 person. OT Short Term Goal 2 (Week 3): Pt will maintain sitting balance with no more than mod A within BADL tasks OT Short Term Goal 3 (Week 3): Pt will assist with pulling pant up in bed OT Short Term Goal 4 (Week 3): Pt will attend to R UE with mod verbal cues within BADL tasks  Skilled Therapeutic Interventions/Progress Updates:  Patient met seated in wc in agreement with OT treatment session with focus on functional transfers, R attention/visual tracking, and cervical ROM as detailed below. Patient's daughter present at bedside with OT providing education on allowing patient adequate time to respond to questions in casual conversation. Daughter expressed verbal understanding. Patient required total A to don footwear seated in TIS wc. Total A for wc transport to dayroom. Patient more lethargic today, perseverating on the word "yes" and requiring multimodal cueing to follow 1-step verbal commands and for attention to task. Sit to stand x3 trials without DME and Max A +2. Patient able to maintain static sitting balance for ~10 minutes with occasional Min A and multimodal cues with mirror providing visual feedback. Cervical ROM seated at edge of mat with patient able to track bright orange cone in L visual field but requiring increased time and tactile cues for tracking in R visual field. Session concluded with patient seated in TIS wc with call bell within reach and all needs met.   Therapy Documentation Precautions:  Precautions Precautions: Fall Precaution Comments: R hemi; R inattention Restrictions Weight Bearing Restrictions: No  Therapy/Group: Individual Therapy  Shakeita Vandevander R  [place] 09/25/2019, 9:16 AM

## 2019-09-25 NOTE — Progress Notes (Signed)
On call provider [name], PA contacted to see if pt needed updated labs due to the IV medications administered. Provider stated no repeat labs needed at this time.

## 2019-09-26 ENCOUNTER — Inpatient Hospital Stay (HOSPITAL_COMMUNITY): Payer: Medicare Other | Admitting: Speech Pathology

## 2019-09-26 ENCOUNTER — Inpatient Hospital Stay (HOSPITAL_COMMUNITY): Payer: Medicare Other | Admitting: Occupational Therapy

## 2019-09-26 ENCOUNTER — Inpatient Hospital Stay (HOSPITAL_COMMUNITY): Payer: Medicare Other

## 2019-09-26 DIAGNOSIS — F09 Unspecified mental disorder due to known physiological condition: Secondary | ICD-10-CM

## 2019-09-26 LAB — GLUCOSE, CAPILLARY
Glucose-Capillary: 97 mg/dL (ref 70–99)
Glucose-Capillary: 110 mg/dL — ABNORMAL HIGH (ref 70–99)
Glucose-Capillary: 122 mg/dL — ABNORMAL HIGH (ref 70–99)
Glucose-Capillary: 108 mg/dL — ABNORMAL HIGH (ref 70–99)

## 2019-09-26 MED ORDER — SORBITOL 70 % SOLN
30.0000 mL | Freq: Every day | Status: DC | PRN
  Filled 2019-09-26: qty 30

## 2019-09-26 MED ORDER — METHYLPHENIDATE HCL 5 MG PO TABS
5.0000 mg | ORAL_TABLET | Freq: Two times a day (BID) | ORAL | Status: DC
  Administered 2019-09-26 – 2019-10-07 (×23): 5 mg via ORAL
  Filled 2019-09-26 (×24): qty 1

## 2019-09-26 MED ORDER — SENNOSIDES-DOCUSATE SODIUM 8.6-50 MG PO TABS
2.0000 | ORAL_TABLET | Freq: Two times a day (BID) | ORAL | Status: DC
  Administered 2019-09-26 – 2019-09-28 (×4): 2 via ORAL
  Filled 2019-09-26 (×4): qty 2

## 2019-09-26 MED ORDER — ATENOLOL 25 MG PO TABS
25.0000 mg | ORAL_TABLET | Freq: Every day | ORAL | Status: DC
  Administered 2019-09-27 – 2019-10-07 (×11): 25 mg via ORAL
  Filled 2019-09-26 (×11): qty 1

## 2019-09-26 NOTE — Consult Note (Signed)
Neuropsychological Consultation   Patient:   David [name]   DOB:   Feb 13, 1924  MR Number:  [id number]  Location:  [facility] [id number] [place] [address] Dept: [place]: [phone]           Date of Service:   09/25/2019  Start Time:   4 PM End Time:   5 PM  Provider/Observer:  Ilean Skill, Psy.D.       Clinical Neuropsychologist       Billing Code/Service: [id number]  Chief Complaint:    David [name] is a 86 year old male with a history of CAD, COPD, LBP, tremors, AVR who was admitted on 09/04/2019 with right facial weakness, dysarthria, right hemiparesis and left gaze preference.  CT head done showing left ICH subcortical.  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  Bleed was felt to be due to small vessel disease versus thrombocytopenia as platelets was 93 at admission.  The patient has continued to have significant right-sided motor deficits, difficulty with expressive language primarily due to motor deficits and some degree of confusion and cognitive impairments.  Reason for Service:  The patient was referred for neuropsychological consultation due to adjustment issues and coping and the patient making statements indications that he was questioning his reason for living going forward.  Below is the HPI for the current mission.  David [name] is a 86 year old male with history of CAD, COPD, LBP, tremors, AVR who was admitted on 04/21/21with rightfacial weakness, dysarthria, right hemiparesis, and left gaze preference. Daughter had left for MD appointment and when she returned an hour later she was lying on the floor between beds. EMS was activated and CT head done showing acute left ICH. CTA head/neck was negative for vascular malformation and left  basal ganglia was negative for spot sign. 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 Bleed was felt to be due to small vessel disease versus thrombocytopenia as platelets with 93 at admission.  2D echo was done showing EF of 60 to 65% with mild LVH and bioprosthetic aortic valve as well as 1.1 cm with vegetation attached to the RV side of pulmonic valve. 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 Claiborne Billings. Is patient without fevers or other clinical indication of SBE TEE was not felt to be indicated. MBS was done on 423 showing moderate dysphagia with delayed swallow and trace aspiration of thins--he was placed on dysphagia 1, honey liquids by spoon. Patient with resultant dysphagia, aphasia, delayed processing with difficulty following one-step commands BLE weakness with visual impairments affecting ADLs and mobility. CIR was recommended due to functional decline.  Current Status:  The patient had great difficulty with expressive language functioning and severe right-sided hemiparesis.  The patient had difficulty with orientation and while he did know he was in the hospital and understood the basic aspects of his cerebrovascular accident he was clearly having cognitive difficulties.  The patient's daughters reported that he had been doing very well given his age and was driving even.  The patient has advanced small vessel disease and likely had some degree of cognitive deficits prior to this most recent event.  He  does appear to have deficits beyond simply his hemiplegia.  The patient attempted to describe his concerns about how he would be able to manage and cope going forward with the severe deficits and the patient asked to speak alone for a  period of time to talk about what his goals were and his degree of frustration with his loss of functioning.  The patient described not wanting to live if he was not able to regain any functioning but denied a desire to or impulse to purposefully harm himself.  The patient is frustrated that his daughters are having to or will have to do so much for him in the future and is at one level pushing them away even though they are motivated to help him out and we are still very early in the process of recovery.  The patient does not appear to be suicidal but is trying to cope with the significant loss of functioning and his motivation is or more about minimizing other people's requirements and caring for him.   Behavioral Observation: David [name]  presents as a 86 y.o.-year-old Right Caucasian Male who appeared his stated age. his dress was Appropriate and he was Well Groomed and his manners were Appropriate to the situation.  his participation was indicative of Appropriate and Inattentive behaviors.  There were any physical disabilities noted.  he displayed an appropriate level of cooperation and motivation.     Interactions:    Active Appropriate and Inattentive  Attention:   abnormal and attention span appeared shorter than expected for age  Memory:   abnormal; global memory impairment noted  Visuo-spatial:  not examined  Speech (Volume):  low  Speech:   slurred; garbled  Thought Process:  Circumstantial  Though Content:  WNL; not suicidal and not homicidal  Orientation:   person and place  Judgment:   Poor  Planning:   Poor  Affect:    Blunted, Flat and Lethargic  Mood:    Dysphoric  Insight:   Shallow  Intelligence:   normal  Medical History:   Past Medical History:  Diagnosis Date  . CAD (coronary artery disease)    intolerance of statins  . COPD (chronic obstructive pulmonary disease) ([place])   . Current use of long term anticoagulation   . GERD (gastroesophageal reflux  disease)   . Hyperlipidemia   . Hypertension   . Impaired glucose tolerance 02/14/2011  . LBP (low back pain)   . OA (osteoarthritis)   . 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     Psychiatric History:  No prior psychiatric history and given his age and degree of small vessel disease the patient does appear to have been doing quite well relatively speaking.  Family Med/Psych History:  Family History  Problem Relation Age of Onset  . Coronary artery disease Other   . Hypertension Other     Risk of Suicide/Violence: low the patient denies any impulses or plans to purposely harm himself but clearly is questioning whether or not he wants to live with continued deficits and dependency upon others.  Impression/DX:  David [name] is a 86 year old male with a history of CAD, COPD, LBP, tremors, AVR who was admitted on 09/04/2019 with right facial weakness, dysarthria, right hemiparesis and left gaze preference.  CT head done showing left ICH subcortical.  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  Bleed  was felt to be due to small vessel disease versus thrombocytopenia as platelets was 93 at admission.  The patient has continued to have significant right-sided motor deficits, difficulty with expressive language primarily due to motor deficits and some degree of confusion and cognitive impairments.  The patient had great difficulty with expressive language functioning and severe right-sided hemiparesis.  The patient had difficulty with orientation and while he did know he was in the hospital and understood the basic aspects of his cerebrovascular accident he was clearly having cognitive difficulties.  The patient's daughters reported that he had been doing very well given his age and was driving even.  The patient has advanced small vessel disease and likely had some  degree of cognitive deficits prior to this most recent event.  He does appear to have deficits beyond simply his hemiplegia.  The patient attempted to describe his concerns about how he would be able to manage and cope going forward with the severe deficits and the patient asked to speak alone for a period of time to talk about what his goals were and his degree of frustration with his loss of functioning.  The patient described not wanting to live if he was not able to regain any functioning but denied a desire to or impulse to purposefully harm himself.  The patient is frustrated that his daughters are having to or will have to do so much for him in the future and is at one level pushing them away even though they are motivated to help him out and we are still very early in the process of recovery.  The patient does not appear to be suicidal but is trying to cope with the significant loss of functioning and his motivation is or more about minimizing other people's requirements and caring for him.  Disposition/Plan:  I will follow up with the patient next week and we will continue to discuss issues regarding him coping with his significant motor deficits and continue to monitor his mood state.  The patient does not appear to be in danger of purposefully harming himself but is questioning the utility of continuing to live with this degree of severe deficits  Diagnosis:    Subcortical left intracranial hemorrhage primarily affecting basal ganglia area       Electronically Signed   _______________________ Ilean Skill, Psy.D.

## 2019-09-26 NOTE — Progress Notes (Signed)
Patient ID: David [name], male   DOB: March 01, 1924, 86 y.o.   MRN: [id number]  Sw followed up with daughters to see if there were any questions about the list of SNF and [facility] options. Daughters are reviewing and will collaborate. SW will follow up with family on Monday.

## 2019-09-26 NOTE — Progress Notes (Signed)
Physical Therapy Session Note  Patient Details  Name: David [name] MRN: [id number] Date of Birth: [birth year]  Today's Date: 09/26/2019 PT Individual Time: [id number] PT Individual Time Calculation (min): 55 min   Short Term Goals: Week 3:  PT Short Term Goal 1 (Week 3): Patient will perform bed mobility with mod A consistently with use of bed rails. PT Short Term Goal 2 (Week 3): Patient will perform basic transfers with mod A. PT Short Term Goal 3 (Week 3): Patient will initiate car transfer training. PT Short Term Goal 4 (Week 3): Patient will ambulate >10 ft with max A using functional AD (not rail).  Skilled Therapeutic Interventions/Progress Updates:     Patient in TIS w/c upon PT arrival. Patient alert and agreeable to PT session. Patient denied pain during session, however, displayed discomfort in TIS w/c and reported feeling constipated. Per patient's daughter he received an enema this morning and has not had a BM in several days, also reported that he had been sitting in the TIS w/c since early this morning. Patient was verbose and speaking clearly at beginning of session occasionally selecting an incorrect word. Discussed his discomfort from being constipated and frustration with his R side throughout session.   Therapeutic Activity: Transfers: Patient performed sit to/from stand x4 in the [place], including transfer to/from [facility], with max +2 assist, patient had increased difficulty with R hip and knee extension this session. Provided verbal cues for timing, reduced pushing to the R, and erect posture with hip and knee extension. Patient attempted to have a BM on the [facility], however was unsuccessful, RN attempted digital stimulation and was unable to feel anything. Patient frequently strained on the [facility], PT provided education on risks of straining while toileting and encouraged him to perform diaphragmatic breathing, relaxation techniques, and rocking to assist with bowl stimulation instead.  Required total A for LB clothing management during toileting.  Bed Mobility: Patient performed sit to supine with max-total A +2 due to fatigue. Provided verbal cues for leaning to his L elbow then lifting LEs onto the bed. Performed scooting up in bed with max-total A +2, provided cues for bending L LE then push to assist with scooting.   Neuromuscular Re-ed: -R LE hip flexion/extension slow PROM with gentle OP to reduce extensor tone, improved ROM and tone this session reaching>90 degrees knee flexion and hip flexion -R LE hip flexion/extension rhythmic initiation progressing to intermittent AAROM -R heel cord stretch 2x30 sec -R shoulder ER/IR slow PROM with gentle OP to reduce extensor tone, approximately 45 degrees of motion -R elbow flexion/extension slow PROM with gentle OP to reduce extensor tone throughout range, initiated rhythmic initiation, however, patient unable to progress past passive ROM -R shoulder flexion with scapular stabilization slow PROM with gentle OP to reduce extensor tone to >100 degrees of flexion  Used smart check to assess air pressure in Roho cushion and pumped it up due to low air pressure for improved sitting tolerance and skin integrity when patient is seated in the TIS w/c.  Patient in bed with both of his daughters at bedside placing R PRAFO and hand splint at end of session with breaks locked, bed alarm set, and all needs within reach.    Therapy Documentation Precautions:  Precautions Precautions: Fall Precaution Comments: R hemi; R inattention Restrictions Weight Bearing Restrictions: No    Therapy/Group: Individual Therapy  Shavaun Osterloh L Rhyan Radler PT, DPT  09/26/2019, 4:10 PM

## 2019-09-26 NOTE — Progress Notes (Signed)
Occupational Therapy Session Note  Patient Details  Name: David [name] MRN: [id number] Date of Birth: 10/22/1923  Today's Date: 09/26/2019  Session 1 OT Individual Time: [id number] OT Individual Time Calculation (min): 28 min   Session 2 OT Individual Time: [id number] OT Individual Time Calculation (min): 45 min    Short Term Goals: Week 3:  OT Short Term Goal 1 (Week 3): Pt will transfer with assist of 1 person. OT Short Term Goal 2 (Week 3): Pt will maintain sitting balance with no more than mod A within BADL tasks OT Short Term Goal 3 (Week 3): Pt will assist with pulling pant up in bed OT Short Term Goal 4 (Week 3): Pt will attend to R UE with mod verbal cues within BADL tasks  Skilled Therapeutic Interventions/Progress Updates:    Session 1 Pt greeted semi-reclined in bed with daughter present and agreeable to OT treatment session. Pt noted to be incontinent of urine. Rolling with mod A to the R and max A to the L for brief change. Max A +2 for bed mobility to EOB. Worked on sitting balance with pt initially needing mod A 2/2 posterior LOB, eventually able to achieve moments of supervision before eventually loosing balance backwards. LB dressing completed at EOB. Had pt try to put R LE into pant legs and reach forward to facilitate anterior weight shift. Pt was able to reach forward to assist with threading pants, but still needed max/total A overall. Sit<>stand from EOB with max +2, while therapist pulled up pants. Pt took seated rest break, then completed squat-pivot from EOB to TIS wc on the R side with Max A of 1. OT placed SAEBO e-stim on wrist extensors. SAEBO left on for 60 minutes. OT returned to remove SAEBO with skin intact and no adverse reactions.  [place]o Stim One 330 pulse width 35 Hz pulse rate On 8 sec/ off 8 sec Ramp up/ down 2 sec Symmetrical Biphasic wave form  Max intensity 153mA at 500 Ohm load   Session 2 Pt greeted seated in TIS wc and agreeable to OT  treatment session. Pt brought down to therapy gym and worked on sit<>stands in parallel bars. Incorporated R UE NMR with weight bearing through bar in standing. Also used mirror feedback and second OT to help facilitate more trunk extension and to get pt to lift his head and look up. Pt tolerated 2 sit<>stands at 2 minutes long. Pt with good power up and mod/max A to get to full hip extension with standing. Dynavision activity to promote R attention with improved initiation to look and locate lights on R side. Gentle stretching and ROM completed with R UE with slight scapula activation noted. Pt returned to room and was left seated in TIS wc with alarm belt on, call bell in reach, and daughter present.   Therapy Documentation Precautions:  Precautions Precautions: Fall Precaution Comments: R hemi; R inattention Restrictions Weight Bearing Restrictions: No Pain:  denies pain  Therapy/Group: Individual Therapy  Valma Cava 09/26/2019, 12:42 PM

## 2019-09-26 NOTE — Progress Notes (Signed)
Speech Language Pathology Weekly Progress and Session Note  Patient Details  Name: David [name] MRN: [id number] Date of Birth: 1924/03/30  Beginning of progress report period: Sep 19, 2019 End of progress report period: Sep 26, 2019  Today's Date: 09/26/2019 SLP Individual Time: 0915-1000 SLP Individual Time Calculation (min): 45 min  Short Term Goals: Week 2: SLP Short Term Goal 1 (Week 2): Patient will utilize speech intelligibility strategies at the phrase level to achieve ~75% intelligibility with Max verbal cues. SLP Short Term Goal 1 - Progress (Week 2): Met SLP Short Term Goal 2 (Week 2): Patient will verbalize wants/needs at the phrase level with Mod verbal cues. SLP Short Term Goal 2 - Progress (Week 2): Met SLP Short Term Goal 3 (Week 2): Patient will follow 2 step commands with 75% accuracy and Max verbal and visual cues. SLP Short Term Goal 3 - Progress (Week 2): Not met SLP Short Term Goal 4 (Week 2): Patient will demonstrate sustained attention to functional tasks for 15 minutes with Mod verbal cues for redirection. SLP Short Term Goal 4 - Progress (Week 2): Met SLP Short Term Goal 5 (Week 2): Patient will demonstrate orientation to place, time and situation with Max A multimodal cues. SLP Short Term Goal 5 - Progress (Week 2): Met SLP Short Term Goal 6 (Week 2): Patient will conusme current diet with minimal overt s/s of aspiration with Min verbal cues for use of swallowing compensatory strategies. SLP Short Term Goal 6 - Progress (Week 2): Met    New Short Term Goals: Week 3: SLP Short Term Goal 1 (Week 3): Patient will utilize speech intelligibility strategies at the phrase level to achieve ~90% intelligibility with Mod verbal cues. SLP Short Term Goal 2 (Week 3): Patient will verbalize wants/needs at the sentence level with Mod verbal cues. SLP Short Term Goal 3 (Week 3): Patient will demonstrate sustained attention to functional tasks for 30 minutes with Mod verbal  cues for redirection. SLP Short Term Goal 4 (Week 3): Patient will demonstrate orientation to place, time and situation with Mod A multimodal cues. SLP Short Term Goal 5 (Week 3): Patient will consume trials of ice chips with minimal overt s/s of aspiration over 2 sessions with Min verbal cues to assess readiness for repeat MBS SLP Short Term Goal 6 (Week 3): Patient will demonstrate efficient mastication and complete oral clearance with trials of Dys. 2 textures without overt s/s of aspiration over 2 sessions prior to upgrade.  Weekly Progress Updates: Patient has made inconsistent but functional gains and has met 6 of 6 STGs this reporting period. Currently, patient requires Max A verbal and visual cues for orientation to place, time and situation as well as Mod verbal cues for sustained attention to tasks. However, intermittent generalized confusion remains and is correlated to lethargy. When patient is awake and alert, patient can be up to 90% intelligible at the phrase level but intelligibility can be reduced to ~25% when lethargic. As patient's overall intelligibility improves, it appears his word-finding appeared intact with deficits in auditory comprehension noted, but suspect that is exacerbated by cognitive deficits. Patient remains on Dys. 1 textures with honey-thick liquids with minimal overt s/s of aspiration and overall Min A verbal cues for use of strategies. Trials of upgraded textures have been attempted without success and patient having to orally expectorate trials. A repeat MBS will be needed prior to upgrade of liquids. Patient and family education ongoing. Patient would benefit from continued skilled SLP intervention to  maximize his cognitive-linguistic and swallowing function prior to discharge.      Intensity: Minumum of 1-2 x/day, 30 to 90 minutes Frequency: 3 to 5 out of 7 days Duration/Length of Stay: 5/25 Treatment/Interventions: Cognitive  remediation/compensation;Internal/external aids;Speech/Language facilitation;Environmental Environmental consultant;Therapeutic Activities;Patient/family education;Functional tasks;Dysphagia/aspiration precaution training   Daily Session  Skilled Therapeutic Interventions: Skilled treatment session focused on communication goals. SLP facilitated session by providing verbal and visual cues for patient to respond to specific questions about a relevant topic. Today, the patient and SLP discussed his time in the [employer]. Patient answered SLP's open-ended "wh" questions appropriately in 75% of opportunities and was ~75% intelligible at the sentence and conversation level. Overall, patient appeared brighter today with increased vocal intensity. Patient left upright in the wheelchair with alarm on, daughter present and all needs within reach. Continue with current plan of care.       Pain No/Denies Pain  Therapy/Group: Individual Therapy  David [name] 09/26/2019, 6:51 AM

## 2019-09-26 NOTE — Progress Notes (Signed)
[place] PHYSICAL MEDICINE & REHABILITATION PROGRESS NOTE   Subjective/Complaints: Up at eob trying put on pants with OT. No new problems overnight. No major changes in arousal noted.  ROS: Limited due to cognitive/behavioral    Objective: No results found. No results for input(s): WBC, HGB, HCT, PLT in the last 72 hours. No results for input(s): NA, K, CL, CO2, GLUCOSE, BUN, CREATININE, CALCIUM in the last 72 hours.  Intake/Output Summary (Last 24 hours) at 09/26/2019 1023 Last data filed at 09/26/2019 0900 Gross per 24 hour  Intake 1062.89 ml  Output --  Net 1062.89 ml     Physical Exam: Vital Signs Blood pressure (!) 133/91, pulse 71, temperature 98 F (36.7 C), resp. rate 17, height 5\' 11"  (1.803 m), weight 104.5 kg, SpO2 95 %. Constitutional: No distress . Vital signs reviewed. HEENT: EOMI, oral membranes moist Neck: supple Cardiovascular: RRR without murmur. No JVD    Respiratory/Chest: CTA Bilaterally without wheezes or rales. Normal effort    GI/Abdomen: BS +, non-tender, non-distended Ext: no clubbing, cyanosis, or edema Psych: follows basic commands. Still flat. Musc: No edema in extremities.  No tenderness in extremities. Neurological:   Aphasic, dysarthric, confused language at times. unable to follow basic commands or answer basic questions .  Flexor tone RUE, extensor tone RLE still present.  Skin: numerous bruises/lacs on arms all stable.  Small area redness near sacrum/buttocks which are stable.   Assessment/Plan: 1. Functional deficits secondary to left BG ICH which require 3+ hours per day of interdisciplinary therapy in a comprehensive inpatient rehab setting.  Physiatrist is providing close team supervision and 24 hour management of active medical problems listed below.  Physiatrist and rehab team continue to assess barriers to discharge/monitor patient progress toward functional and medical goals  Care Tool:  Bathing        Body parts bathed  by helper: Front perineal area, Buttocks     Bathing assist Assist Level: 2 Helpers     Upper Body Dressing/Undressing Upper body dressing Upper body dressing/undressing activity did not occur (including orthotics): N/A What is the patient wearing?: Hospital gown only    Upper body assist Assist Level: Dependent - Patient 0%    Lower Body Dressing/Undressing Lower body dressing      What is the patient wearing?: Incontinence brief, Pants     Lower body assist Assist for lower body dressing: Dependent - Patient 0%     Toileting Toileting    Toileting assist Assist for toileting: 2 Helpers     Transfers Chair/bed transfer  Transfers assist  Chair/bed transfer activity did not occur: Safety/medical concerns  Chair/bed transfer assist level: Maximal Assistance - Patient 25 - 49% Chair/bed transfer assistive device: Armrests   Locomotion Ambulation   Ambulation assist   Ambulation activity did not occur: Safety/medical concerns  Assist level: Maximal Assistance - Patient 25 - 49% Assistive device: Other (comment)(L rail) Max distance: 29'   Walk 10 feet activity   Assist  Walk 10 feet activity did not occur: Safety/medical concerns  Assist level: Maximal Assistance - Patient 25 - 49% Assistive device: Other (comment)(L rail)   Walk 50 feet activity   Assist Walk 50 feet with 2 turns activity did not occur: Safety/medical concerns         Walk 150 feet activity   Assist Walk 150 feet activity did not occur: Safety/medical concerns         Walk 10 feet on uneven surface  activity   Assist Walk  10 feet on uneven surfaces activity did not occur: Safety/medical concerns         Wheelchair     Assist Will patient use wheelchair at discharge?: Yes Type of Wheelchair: Manual Wheelchair activity did not occur: Safety/medical concerns(lethargy; TIS would be dependent)         Wheelchair 50 feet with 2 turns  activity    Assist            Wheelchair 150 feet activity     Assist          Blood pressure (!) 133/91, pulse 71, temperature 98 F (36.7 C), resp. rate 17, height 5\' 11"  (1.803 m), weight 104.5 kg, SpO2 95 %.  Medical Problem List and Plan: 1.Functional, mobility and cognitive deficitssecondary to left basal ganglia hemorrhage  -recent CT with sl increase in hematoma size/surrounding edema  Continue CIR PT, OT, SLP   -therapies 15/7  5/9: ongoing/increased tone Right side. Not a candidate d/t lethargy for oral antispasmodics.    -PRAFO/AFO, ROM       -arousal still an issue often. CT of head is stable to improved in appearance.  2. Antithrombotics: -DVT/anticoagulation:Mechanical:Sequential compression devices, below kneeBilateral lower extremities--platelets stable to increased 90k -antiplatelet therapy: N/A 3.Chronic LBP/Pain Management:Tylenol prn.Well controlled.  4. Mood:LCSW to follow for evaluation and support.  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 Neuropsych: This patientis notcapable of making decisions on hisown behalf.  -  Amantadine increased to 100mg  bid    -ritalin started 5/11 at 2.5mg , increase to 5mg  bid today  6. Skin/Wound Care:Routine pressure relief measures. 7. Fluids/Electrolytes/Nutrition:resumed po diet  -IV infiltrated last night 5/7   5/7 bun/cr ok, prealbumin 22.6   5/11 bmet reviewed from 5/10 and stable. Changed IVF to HS 8. HTN:  SBP goal<140--added prn catapres  -resumed low dose tenormin 5/5--> DBP still elevated. Increase to 25mg     9.H/oCOPD:Stable without meds. 10. Impaired glucose tolerance:CBG's wnl so far.  - SSI as indicated.  Controlled on 5/12  11. CAD/AVR:Off ASA due to bleed. No statin due to intolerance. Tenormin, cozaar and lasix (used prn) on hold.  -no TEE indicated 12. Thrombocytopenia:   Platelets  decreased to 122 on 5/11. Continue to monitor regularly.  13. AKI: Creatinine 1.11 on 5/11, continue to monitor weekly.  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  Advance diet as tolerated per SLP  See #7 15. Constipation: Senna at bedtime  -no bm since 5/9  -sorbitol today, increase senna-s to 2 tabs bid       LOS: 17 days A FACE TO [place] [facility] Dayan Kreis 09/26/2019, 10:23 AM

## 2019-09-27 ENCOUNTER — Inpatient Hospital Stay (HOSPITAL_COMMUNITY): Payer: Medicare Other | Admitting: Speech Pathology

## 2019-09-27 ENCOUNTER — Inpatient Hospital Stay (HOSPITAL_COMMUNITY): Payer: Medicare Other | Admitting: Occupational Therapy

## 2019-09-27 ENCOUNTER — Inpatient Hospital Stay (HOSPITAL_COMMUNITY): Payer: Medicare Other

## 2019-09-27 LAB — GLUCOSE, CAPILLARY
Glucose-Capillary: 98 mg/dL (ref 70–99)
Glucose-Capillary: 126 mg/dL — ABNORMAL HIGH (ref 70–99)
Glucose-Capillary: 104 mg/dL — ABNORMAL HIGH (ref 70–99)
Glucose-Capillary: 114 mg/dL — ABNORMAL HIGH (ref 70–99)

## 2019-09-27 MED ORDER — FLEET ENEMA 7-19 GM/118ML RE ENEM
1.0000 | ENEMA | Freq: Once | RECTAL | Status: AC
  Administered 2019-09-27: 1 via RECTAL
  Filled 2019-09-27: qty 1

## 2019-09-27 MED ORDER — SORBITOL 70 % SOLN
60.0000 mL | Status: AC
  Administered 2019-09-27: 60 mL via ORAL
  Filled 2019-09-27: qty 60

## 2019-09-27 NOTE — Progress Notes (Addendum)
[place] PHYSICAL MEDICINE & REHABILITATION PROGRESS NOTE   Subjective/Complaints: Lying in bed. Daughter at bedside. No problems overnight. Still hasn't moved bowels. Looks more comfortable  ROS: Limited due to cognitive/behavioral    Objective: No results found. No results for input(s): WBC, HGB, HCT, PLT in the last 72 hours. No results for input(s): NA, K, CL, CO2, GLUCOSE, BUN, CREATININE, CALCIUM in the last 72 hours.  Intake/Output Summary (Last 24 hours) at 09/27/2019 1019 Last data filed at 09/27/2019 0753 Gross per 24 hour  Intake 154 ml  Output --  Net 154 ml     Physical Exam: Vital Signs Blood pressure (!) 155/76, pulse 76, temperature 98 F (36.7 C), temperature source Oral, resp. rate 17, height 5\' 11"  (1.803 m), weight 104.5 kg, SpO2 97 %. Constitutional: No distress . Vital signs reviewed. HEENT: EOMI, oral membranes moist Neck: supple Cardiovascular: RRR without murmur. No JVD    Respiratory/Chest: CTA Bilaterally without wheezes or rales. Normal effort    GI/Abdomen: BS +, non-tender, non-distended Ext: no clubbing, cyanosis, or edema Psych: more engaging today Musc: No edema in extremities.  No tenderness in extremities. Neurological:   Aphasic, dysarthric, confused language at times. More appropriate answers today. more alert and attentive.   Flexor tone RUE, extensor tone RLE still present. Both easily ranged. MAS 1-2/4  Skin: numerous bruises/lacs on arms all stable.  Small area redness near sacrum/buttocks stable  Assessment/Plan: 1. Functional deficits secondary to left BG ICH which require 3+ hours per day of interdisciplinary therapy in a comprehensive inpatient rehab setting.  Physiatrist is providing close team supervision and 24 hour management of active medical problems listed below.  Physiatrist and rehab team continue to assess barriers to discharge/monitor patient progress toward functional and medical goals  Care Tool:  Bathing         Body parts bathed by helper: Front perineal area, Buttocks     Bathing assist Assist Level: 2 Helpers     Upper Body Dressing/Undressing Upper body dressing Upper body dressing/undressing activity did not occur (including orthotics): N/A What is the patient wearing?: Hospital gown only    Upper body assist Assist Level: Dependent - Patient 0%    Lower Body Dressing/Undressing Lower body dressing      What is the patient wearing?: Incontinence brief, Pants     Lower body assist Assist for lower body dressing: Dependent - Patient 0%     Toileting Toileting    Toileting assist Assist for toileting: 2 Helpers     Transfers Chair/bed transfer  Transfers assist  Chair/bed transfer activity did not occur: Safety/medical concerns  Chair/bed transfer assist level: Maximal Assistance - Patient 25 - 49% Chair/bed transfer assistive device: Armrests   Locomotion Ambulation   Ambulation assist   Ambulation activity did not occur: Safety/medical concerns  Assist level: Maximal Assistance - Patient 25 - 49% Assistive device: Other (comment)(L rail) Max distance: 29'   Walk 10 feet activity   Assist  Walk 10 feet activity did not occur: Safety/medical concerns  Assist level: Maximal Assistance - Patient 25 - 49% Assistive device: Other (comment)(L rail)   Walk 50 feet activity   Assist Walk 50 feet with 2 turns activity did not occur: Safety/medical concerns         Walk 150 feet activity   Assist Walk 150 feet activity did not occur: Safety/medical concerns         Walk 10 feet on uneven surface  activity   Assist Walk 10  feet on uneven surfaces activity did not occur: Safety/medical concerns         Wheelchair     Assist Will patient use wheelchair at discharge?: Yes Type of Wheelchair: Manual Wheelchair activity did not occur: Safety/medical concerns(lethargy; TIS would be dependent)         Wheelchair 50 feet with 2 turns  activity    Assist            Wheelchair 150 feet activity     Assist          Blood pressure (!) 155/76, pulse 76, temperature 98 F (36.7 C), temperature source Oral, resp. rate 17, height 5\' 11"  (1.803 m), weight 104.5 kg, SpO2 97 %.  Medical Problem List and Plan: 1.Functional, mobility and cognitive deficitssecondary to left basal ganglia hemorrhage  -recent CT with sl increase in hematoma size/surrounding edema  Continue CIR PT, OT, SLP   -therapies 15/7  5/9: ongoing/increased tone Right side. Not a candidate d/t lethargy for oral antispasmodics.    -PRAFO/AFO, ROM with therapy/family       -arousal still can wax/wane: recent CT of head was stable to improved in appearance.  2. Antithrombotics: -DVT/anticoagulation:Mechanical:Sequential compression devices, below kneeBilateral lower extremities--platelets stable to increased 90k -antiplatelet therapy: N/A 3.Chronic LBP/Pain Management:Tylenol prn.Well controlled.  4. Mood:LCSW to follow for evaluation and support.  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 Neuropsych: This patientis notcapable of making decisions on hisown behalf.  -  Amantadine increased to 100mg  bid    -ritalin started 5/11 at 2.5mg , increase to 5mg  bid 5/13. Bp/hr ok 6. Skin/Wound Care:Routine pressure relief measures. 7. Fluids/Electrolytes/Nutrition:resumed po diet   5/11 bmet reviewed from 5/10 and stable. Changed IVF to HS  5/14 recheck labs monday 8. HTN:  SBP goal<140--added prn catapres  -resumed low dose tenormin 5/5--> DBP still elevated. Increased to 25mg   5/14 reasonable control    9.H/oCOPD:Stable without meds. 10. Impaired glucose tolerance:CBG's wnl so far.  - SSI as indicated.  Controlled on 5/14 11. CAD/AVR:Off ASA due to bleed. No statin due to intolerance. Tenormin, cozaar and lasix (used prn) on hold.  -no TEE  indicated 12. Thrombocytopenia:   Platelets decreased to 122 on 5/11. Continue to monitor regularly.  13. AKI: Creatinine 1.11 on 5/11, continue to monitor weekly.  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  Advance diet as tolerated per SLP  See #7 15. Constipation: Senna at bedtime  -no bm since 5/9, no results with 30cc sorbitol yesterday  5/14-sorbitol 60 cc today, fleet/SS enema if needed  -senkot-s increased ton 5/13       LOS: 18 days A FACE TO [place] 09/27/2019, 10:19 AM

## 2019-09-27 NOTE — Progress Notes (Signed)
Occupational Therapy Session Note  Patient Details  Name: David [name] MRN: [id number] Date of Birth: 1923/10/22  Today's Date: 09/27/2019 OT Individual Time: [id number] OT Individual Time Calculation (min): 60 min    Short Term Goals: Week 3:  OT Short Term Goal 1 (Week 3): Pt will transfer with assist of 1 person. OT Short Term Goal 2 (Week 3): Pt will maintain sitting balance with no more than mod A within BADL tasks OT Short Term Goal 3 (Week 3): Pt will assist with pulling pant up in bed OT Short Term Goal 4 (Week 3): Pt will attend to R UE with mod verbal cues within BADL tasks  Skilled Therapeutic Interventions/Progress Updates:    Pt greeted semi-reclined in bed with daughter present. Daughter reports nurse tech just changed patients brief. Pt came to sitting EOB with max A +2. Worked on sitting balance at EOB while completing LB dressing. Pt able to maintain sitting balance with longer bouts of supervision today. Min/mod A for balance with increased task demand of donning pants. Worked on anterior weight shift to help pull pants up over hips. Sit<>stands at EOB with max +2 overall, but more mod+2 for first stand with improved power up. Pt took rest breaks after 3 sit<>stands. Squat-pivot to wc on L side with OT using bobath method and max+2. Pt brought down to therapy gym in [place]. Used standing frame with pt initiating stand and able to come to standing with mod/max A of 1. Pt tolerated standing for 3 minutes, then 2, 5 minute bouts. Worked on weight bearing through R UE with weight bearing towel pushes for neuro re-ed. OT unable to get pt to actively push or pull. [employer] reaching for cups in standing to promote trunk extension with pt needing hand over hand A initially to understand task, then was able to reach overhead and grasp cup with L hand, then locate OTs hand on R side to stack cups. Pt returned to room at end of session in [place]. OT placed SAEBO e-stim on shoulder. SAEBO  left on for 60 minutes. OT returned to remove SAEBO with skin intact and no adverse reactions.  Saebo Stim One 330 pulse width 35 Hz pulse rate On 8 sec/ off 8 sec Ramp up/ down 2 sec Symmetrical Biphasic wave form  Max intensity 116mA at 500 Ohm load  Therapy Documentation Precautions:  Precautions Precautions: Fall Precaution Comments: R hemi; R inattention Restrictions Weight Bearing Restrictions: No Pain:   denies pain   Therapy/Group: Individual Therapy  Valma Cava 09/27/2019, 10:21 AM

## 2019-09-27 NOTE — Plan of Care (Signed)
  Problem: Consults Goal: [facility] GENERAL PATIENT EDUCATION Description: See Patient Education module for education specifics. Outcome: Progressing Goal: Skin Care Protocol Initiated - if Braden Score 18 or less Description: Patients skin will be free from break down Outcome: Progressing

## 2019-09-27 NOTE — Progress Notes (Signed)
Physical Therapy Session Note  Patient Details  Name: David [name] MRN: [id number] Date of Birth: May 26, 1923  Today's Date: 09/27/2019 PT Individual Time: 1125-1215 PT Individual Time Calculation (min): 50 min   Short Term Goals: Week 1:  PT Short Term Goal 1 (Week 1): Pt will be able to perform bed mobility with mod assist PT Short Term Goal 1 - Progress (Week 1): Progressing toward goal PT Short Term Goal 2 (Week 1): Pt will be able to initiate transfers with max +2 PT Short Term Goal 2 - Progress (Week 1): Met PT Short Term Goal 3 (Week 1): Pt will be able to reorient to midline seated EOB with mod assist PT Short Term Goal 3 - Progress (Week 1): Met Week 2:  PT Short Term Goal 1 (Week 2): Patient will perfrom bed mobility with mod A consistently. PT Short Term Goal 1 - Progress (Week 2): Progressing toward goal PT Short Term Goal 2 (Week 2): Patient will perform transfers with mod A +2. PT Short Term Goal 2 - Progress (Week 2): Met PT Short Term Goal 3 (Week 2): Patient will perform sit to stand with mod A +2. PT Short Term Goal 3 - Progress (Week 2): Met PT Short Term Goal 4 (Week 2): Patient will initiate pre-gait activities. PT Short Term Goal 4 - Progress (Week 2): Met Week 3:  PT Short Term Goal 1 (Week 3): Patient will perform bed mobility with mod A consistently with use of bed rails. PT Short Term Goal 2 (Week 3): Patient will perform basic transfers with mod A. PT Short Term Goal 3 (Week 3): Patient will initiate car transfer training. PT Short Term Goal 4 (Week 3): Patient will ambulate >10 ft with max A using functional AD (not rail).  Skilled Therapeutic Interventions/Progress Updates:    PAIN  Pt initially oob in wc w/daughter present.  Pt transported to main gym for therapy session w/focus on midline orientation in sitting and standing and core strengthening. Squat pvt transfer wc to mat w/mod to max assist of 1, pt w/tendency to push away from transfer surface  w/LUE/repositioned in lap to prevent resistance.  Pt w/mild L lean in sitting, unable to self correct w/cues and mirror for feedback. Pt performed sitting balance activities requiring him to reach across midline w/LUE to R side and return to midline w/improved midline following mult reps. Progressed to standing activities as follows: STS from elevated mat w/max assist of 2, again pushing w/LUE and LLE in standing, engaged LUE in reaching activities to prevent pushing and promote midline orientation, therapist max assist to stabilize LLE and mod assist at trunk. Repeated standing x 3 reps w/2-3 min rest between efforts. Returned to sitting balance /reaching across midline to R and returning to midline, overall balance requires CGA to mod assist. Squat pivt transfer mat to wc to L w/max assist due to pushing and fatigue. W/patient upright in wc performed ant wt shifts and repeated trunk rotation for core strengthening.  Also repeated reaching across midline from L to R by transferring clothespins from bedside table to L and clipping into box w/3 level rods on pt r, min assist for balance. At end of session pt transported to room and left oob in wc w/daughter in room caring for pt needs.   Therapy Documentation Precautions:  Precautions Precautions: Fall Precaution Comments: R hemi; R inattention Restrictions Weight Bearing Restrictions: No    Therapy/Group: Individual Therapy  Callie Fielding, PT   Jerrilyn Cairo  09/27/2019, 12:37 PM

## 2019-09-27 NOTE — Progress Notes (Signed)
Speech Language Pathology Daily Session Note  Patient Details  Name: David [name] MRN: [id number] Date of Birth: May 13, 1924  Today's Date: 09/27/2019 SLP Individual Time: 1345-1430 SLP Individual Time Calculation (min): 45 min  Short Term Goals: Week 3: SLP Short Term Goal 1 (Week 3): Patient will utilize speech intelligibility strategies at the phrase level to achieve ~90% intelligibility with Mod verbal cues. SLP Short Term Goal 2 (Week 3): Patient will verbalize wants/needs at the sentence level with Mod verbal cues. SLP Short Term Goal 3 (Week 3): Patient will demonstrate sustained attention to functional tasks for 30 minutes with Mod verbal cues for redirection. SLP Short Term Goal 4 (Week 3): Patient will demonstrate orientation to place, time and situation with Mod A multimodal cues. SLP Short Term Goal 5 (Week 3): Patient will consume trials of ice chips with minimal overt s/s of aspiration over 2 sessions with Min verbal cues to assess readiness for repeat MBS SLP Short Term Goal 6 (Week 3): Patient will demonstrate efficient mastication and complete oral clearance with trials of Dys. 2 textures without overt s/s of aspiration over 2 sessions prior to upgrade.  Skilled Therapeutic Interventions: Skilled treatment session focused on communication goals. 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 Patient answered specific "wh" questions with 75% accuracy and was overall 80% intelligible at the sentence level. Patient transferred back to bed at end of session via the [facility]. Patient left upright in bed with alarm on and all needs within reach. Continue with current plan of care.      Pain Pain Assessment Pain Scale: 0-10 Pain Score: 0-No pain Faces Pain Scale: No hurt  Therapy/Group: Individual Therapy  Darcel Frane, [place] 09/27/2019, 3:47 PM

## 2019-09-27 NOTE — Progress Notes (Signed)
Patient has large bowel movement after enema, soap suds not given. Adria Devon, LPN

## 2019-09-28 ENCOUNTER — Inpatient Hospital Stay (HOSPITAL_COMMUNITY): Payer: Medicare Other

## 2019-09-28 ENCOUNTER — Inpatient Hospital Stay (HOSPITAL_COMMUNITY): Payer: Medicare Other | Admitting: Occupational Therapy

## 2019-09-28 LAB — GLUCOSE, CAPILLARY
Glucose-Capillary: 97 mg/dL (ref 70–99)
Glucose-Capillary: 111 mg/dL — ABNORMAL HIGH (ref 70–99)
Glucose-Capillary: 112 mg/dL — ABNORMAL HIGH (ref 70–99)
Glucose-Capillary: 113 mg/dL — ABNORMAL HIGH (ref 70–99)

## 2019-09-28 MED ORDER — SENNA 8.6 MG PO TABS
2.0000 | ORAL_TABLET | Freq: Every day | ORAL | Status: DC
  Administered 2019-09-28 – 2019-09-29 (×2): 17.2 mg via ORAL
  Filled 2019-09-28 (×2): qty 2

## 2019-09-28 MED ORDER — DOCUSATE SODIUM 50 MG/5ML PO LIQD
100.0000 mg | Freq: Three times a day (TID) | ORAL | Status: DC
  Administered 2019-09-28 – 2019-09-30 (×6): 100 mg via ORAL
  Filled 2019-09-28 (×6): qty 10

## 2019-09-28 MED ORDER — DOCUSATE SODIUM 100 MG PO CAPS: 100.0000 mg | ORAL_CAPSULE | Freq: Three times a day (TID) | ORAL | Status: DC

## 2019-09-28 MED ORDER — AMANTADINE HCL 50 MG/5ML PO SYRP
100.0000 mg | ORAL_SOLUTION | Freq: Two times a day (BID) | ORAL | Status: DC
  Administered 2019-09-28 – 2019-10-07 (×19): 100 mg via ORAL
  Filled 2019-09-28 (×22): qty 10

## 2019-09-28 NOTE — Progress Notes (Signed)
Occupational Therapy Session Note  Patient Details  Name: David [name] MRN: [id number] Date of Birth: Nov 09, 1923  Today's Date: 09/28/2019 OT Individual Time: 1050-1203 OT Individual Time Calculation (min): 73 min    Short Term Goals: Week 3:  OT Short Term Goal 1 (Week 3): Pt will transfer with assist of 1 person. OT Short Term Goal 2 (Week 3): Pt will maintain sitting balance with no more than mod A within BADL tasks OT Short Term Goal 3 (Week 3): Pt will assist with pulling pant up in bed OT Short Term Goal 4 (Week 3): Pt will attend to R UE with mod verbal cues within BADL tasks  Skilled Therapeutic Interventions/Progress Updates:    Pt greeted semi-reclined in bed with daughter present. Pt very alert and bright eyed this morning. Pt also more intelligible with speech today. Pt eager to get out of bed. Brief checked prior to transfer and pt noted to be incontinent of urine. Pt with improved initiation to roll today, requiring mod A of 1 to roll R, and max of 1 to roll L, but pt initiated rolling to each side. Pt also assisted with advancing R LE towards EOB, but needed OT assist for L. Decreased tone today throughout L LE and L UE. Worked on sitting balance at EOB with bouts of supervision, but min/mod A with later LOB. Worked on anterior weight shift to reach forward and help thread pants over LLE. Sit<>stand at EOB with max +2 and facilitation at hips to achieve full extension-then OT assist to pull up pants. Max A squat-pivot to TIs wc on L side. Pt brought down to therapy gym and worked on sit<>stands in standing frame with pt able to power up, but needed mod/max +2 to achieve full stand. Facilitated increased hip and trunk extension with reaching overhead with L hand to get pags, then place in peg board in front of pt. R UE NMR with weight bearing while standing. OT also provided joint input through R UE in sitting to facilitate shoulder and elbow flex/ext. Slight flexor synergy noted. Pt  was also able to wiggle his first finger and thumb today! OT placed kinesiotape to R shoulder for subluxation support. OT issued squeze foam to help facilitate more finger flexion. OT placed SAEBO e-stim on wrist extensors. SAEBO left on for 60 minutes. OT returned to remove SAEBO with skin intact and no adverse reactions.  Saebo Stim One 330 pulse width 35 Hz pulse rate On 8 sec/ off 8 sec Ramp up/ down 2 sec Symmetrical Biphasic wave form  Max intensity 143mA at 500 Ohm load   Therapy Documentation Precautions:  Precautions Precautions: Fall Precaution Comments: R hemi; R inattention Restrictions Weight Bearing Restrictions: No Pain: Pain Assessment Pain Scale: 0-10 Pain Score: 0-No pain   Therapy/Group: Individual Therapy  Valma Cava 09/28/2019, 11:51 AM

## 2019-09-28 NOTE — Progress Notes (Signed)
Physical Therapy Session Note  Patient Details  Name: David [name] MRN: [id number] Date of Birth: [birth year]  Today's Date: 09/28/2019 PT Individual Time: 0136-0230 PT Individual Time Calculation (min): 54 min   Short Term Goals: Week 1:  PT Short Term Goal 1 (Week 1): Pt will be able to perform bed mobility with mod assist PT Short Term Goal 1 - Progress (Week 1): Progressing toward goal PT Short Term Goal 2 (Week 1): Pt will be able to initiate transfers with max +2 PT Short Term Goal 2 - Progress (Week 1): Met PT Short Term Goal 3 (Week 1): Pt will be able to reorient to midline seated EOB with mod assist PT Short Term Goal 3 - Progress (Week 1): Met Week 2:  PT Short Term Goal 1 (Week 2): Patient will perfrom bed mobility with mod A consistently. PT Short Term Goal 1 - Progress (Week 2): Progressing toward goal PT Short Term Goal 2 (Week 2): Patient will perform transfers with mod A +2. PT Short Term Goal 2 - Progress (Week 2): Met PT Short Term Goal 3 (Week 2): Patient will perform sit to stand with mod A +2. PT Short Term Goal 3 - Progress (Week 2): Met PT Short Term Goal 4 (Week 2): Patient will initiate pre-gait activities. PT Short Term Goal 4 - Progress (Week 2): Met Week 3:  PT Short Term Goal 1 (Week 3): Patient will perform bed mobility with mod A consistently with use of bed rails. PT Short Term Goal 2 (Week 3): Patient will perform basic transfers with mod A. PT Short Term Goal 3 (Week 3): Patient will initiate car transfer training. PT Short Term Goal 4 (Week 3): Patient will ambulate >10 ft with max A using functional AD (not rail).  Skilled Therapeutic Interventions/Progress Updates:    PAIN pt c/o low back and gluteal pain but unable to quantify.  Daughter in room and states pt uncomfortable due to cushion.  See below for changes to address cushion/sitting comfort.  Pt initially oob in wc and see above for c/o.  After speaking w/daughter and discussing plans to  address cushion, pt transported to gym for session.  Squat pvt transfer wc to mat w/max assist to L/pt pushing w/LLE away from transfer surface.  In sitting worked on weight shifting to encourage midline/decrease L tendency.  Pt initially handed disks at midline and using L hand reached across midline to R to place in giant connect four slots.  Repeated x 10 w/return to midline and improved midline posture following task.   Pt then progressed to leaning ant/L to obtain disks from game station and crossing midline to R to place in slots to R.  Repeated x 8 w/min assist to control wt shift to L at times.  Progressed to standing in Steady, STS w/min assist for partial stand then max assist to complete upright/stabilize/extend R knee/ wt shift to midline.  Worked on reaching overhead and forwards to promote extension.  Pt requires frequent rest in semistand due to difficulty w/this task.  Pt did demonstrate some pincher grasp and extension of R first digit while hand over hand assist provided to RUE to grasp bar of steady.  WC cushion appeared to be overinflated therefore air removed from cushion.  Decided to trial David [name] cushion as cushion was likely inflated to address comfort issues and concern he would remain uncomfortable in current cushion.  Pt transferred to wc using steady due to fatigue w/standing/sitting balance activities.  Left roho combo cushion in room and daughter educated about trial w/David [name] cushion.  Requested she leave feedback for next therapist regarding pt comfort after prolonged sitting on David [name].  Pt stating he was comfortable on David [name], but need to assess following extended time in wc.  Pt transported back to room at end of session and Pt left oob in wc w/alarm belt set and needs in reach, daughter in room.   Therapy Documentation Precautions:  Precautions Precautions: Fall Precaution Comments: R hemi; R inattention Restrictions Weight Bearing Restrictions: No    Therapy/Group:  Individual Therapy  Callie Fielding, [place] 09/28/2019, 4:21 PM

## 2019-09-28 NOTE — Progress Notes (Signed)
[facility] PHYSICAL MEDICINE & REHABILITATION PROGRESS NOTE   Subjective/Complaints: Lying in bed. Daughter at bedside. No problems overnight. Had BM last night with enema.   ROS: Limited due to cognitive/behavioral    Objective: No results found. No results for input(s): WBC, HGB, HCT, PLT in the last 72 hours. No results for input(s): NA, K, CL, CO2, GLUCOSE, BUN, CREATININE, CALCIUM in the last 72 hours.  Intake/Output Summary (Last 24 hours) at 09/28/2019 1053 Last data filed at 09/27/2019 1821 Gross per 24 hour  Intake 358 ml  Output --  Net 358 ml     Physical Exam: Vital Signs Blood pressure 139/78, pulse (!) 50, temperature 98.3 F (36.8 C), resp. rate 18, height 5\' 11"  (1.803 m), weight 104.5 kg, SpO2 96 %. Constitutional: No distress . Vital signs reviewed. HEENT: EOMI, oral membranes moist Neck: supple Cardiovascular: RRR without murmur. No JVD    Respiratory/Chest: CTA Bilaterally without wheezes or rales. Normal effort    GI/Abdomen: BS +, non-tender, non-distended Ext: no clubbing, cyanosis, or edema Psych: more engaging today Musc: No edema in extremities.  No tenderness in extremities. Neurological:   Aphasic, dysarthric, confused language at times. More appropriate answers today. more alert and attentive.   Trying to articulate more. Flexor tone RUE, extensor tone RLE still present. Both easily ranged. MAS 1-2/4  Skin: numerous bruises/lacs on arms all stable.  Small area redness near sacrum/buttocks stable  Assessment/Plan: 1. Functional deficits secondary to left BG ICH which require 3+ hours per day of interdisciplinary therapy in a comprehensive inpatient rehab setting.  Physiatrist is providing close team supervision and 24 hour management of active medical problems listed below.  Physiatrist and rehab team continue to assess barriers to discharge/monitor patient progress toward functional and medical goals  Care Tool:  Bathing        Body  parts bathed by helper: Front perineal area, Buttocks     Bathing assist Assist Level: 2 Helpers     Upper Body Dressing/Undressing Upper body dressing Upper body dressing/undressing activity did not occur (including orthotics): N/A What is the patient wearing?: Hospital gown only    Upper body assist Assist Level: Dependent - Patient 0%    Lower Body Dressing/Undressing Lower body dressing      What is the patient wearing?: Incontinence brief, Pants     Lower body assist Assist for lower body dressing: Dependent - Patient 0%     Toileting Toileting    Toileting assist Assist for toileting: 2 Helpers     Transfers Chair/bed transfer  Transfers assist  Chair/bed transfer activity did not occur: Safety/medical concerns  Chair/bed transfer assist level: Maximal Assistance - Patient 25 - 49% Chair/bed transfer assistive device: Armrests   Locomotion Ambulation   Ambulation assist   Ambulation activity did not occur: Safety/medical concerns  Assist level: Maximal Assistance - Patient 25 - 49% Assistive device: Other (comment)(L rail) Max distance: 29'   Walk 10 feet activity   Assist  Walk 10 feet activity did not occur: Safety/medical concerns  Assist level: Maximal Assistance - Patient 25 - 49% Assistive device: Other (comment)(L rail)   Walk 50 feet activity   Assist Walk 50 feet with 2 turns activity did not occur: Safety/medical concerns         Walk 150 feet activity   Assist Walk 150 feet activity did not occur: Safety/medical concerns         Walk 10 feet on uneven surface  activity   Assist Walk  10 feet on uneven surfaces activity did not occur: Safety/medical concerns         Wheelchair     Assist Will patient use wheelchair at discharge?: Yes Type of Wheelchair: Manual Wheelchair activity did not occur: Safety/medical concerns(lethargy; TIS would be dependent)         Wheelchair 50 feet with 2 turns  activity    Assist            Wheelchair 150 feet activity     Assist          Blood pressure 139/78, pulse (!) 50, temperature 98.3 F (36.8 C), resp. rate 18, height 5\' 11"  (1.803 m), weight 104.5 kg, SpO2 96 %.  Medical Problem List and Plan: 1.Functional, mobility and cognitive deficitssecondary to left basal ganglia hemorrhage  -recent CT with sl increase in hematoma size/surrounding edema  Continue CIR PT, OT, SLP   -therapies 15/7  5/9: ongoing/increased tone Right side. Not a candidate d/t lethargy for oral antispasmodics.    -PRAFO/AFO, ROM with therapy/family       -arousal still can wax/wane: recent CT of head was stable to improved in appearance.  2. Antithrombotics: -DVT/anticoagulation:Mechanical:Sequential compression devices, below kneeBilateral lower extremities--platelets stable to increased 90k -antiplatelet therapy: N/A 3.Chronic LBP/Pain Management:Tylenol prn.well controlled 4. Mood:LCSW to follow for evaluation and support.  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 Neuropsych: This patientis notcapable of making decisions on hisown behalf.  -  Amantadine increased to 100mg  bid    -ritalin started 5/11 at 2.5mg , increase to 5mg  bid 5/13. Bp/hr ok 6. Skin/Wound Care:Routine pressure relief measures. 7. Fluids/Electrolytes/Nutrition:resumed po diet   5/11 bmet reviewed from 5/10 and stable. Changed IVF to HS  5/14 recheck labs monday 8. HTN:  SBP goal<140--added prn catapres  -resumed low dose tenormin 5/5--> DBP still elevated. Increased to 25mg   5/14 reasonable control    9.H/oCOPD:Stable without meds. 10. Impaired glucose tolerance:CBG's wnl so far.  - SSI as indicated.  Controlled on 5/15 at 97 11. CAD/AVR:Off ASA due to bleed. No statin due to intolerance. Tenormin, cozaar and lasix (used prn) on hold.  -no TEE indicated 12.  Thrombocytopenia:   Platelets decreased to 122 on 5/11. Continue to monitor regularly.  13. AKI: Creatinine 1.11 on 5/11, continue to monitor weekly.  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  Advance diet as tolerated per SLP  See #7  Eating stable.  15. Constipation: Senna at bedtime  -no bm since 5/9, no results with 30cc sorbitol yesterday  5/14-sorbitol 60 cc today, fleet/SS enema if needed  -senkot-s increased ton 5/13  5/15: Had BM yesterday. Regimen modified to two Senna HS and 1 colace 100mg  TID.        LOS: 19 days A FACE TO FACE EVALUATION WAS PERFORMED  David [name] David [name] 09/28/2019, 10:53 AM

## 2019-09-29 ENCOUNTER — Inpatient Hospital Stay (HOSPITAL_COMMUNITY): Payer: Medicare Other | Admitting: Speech Pathology

## 2019-09-29 ENCOUNTER — Inpatient Hospital Stay (HOSPITAL_COMMUNITY): Payer: Medicare Other

## 2019-09-29 ENCOUNTER — Inpatient Hospital Stay (HOSPITAL_COMMUNITY): Payer: Medicare Other | Admitting: Occupational Therapy

## 2019-09-29 LAB — GLUCOSE, CAPILLARY
Glucose-Capillary: 106 mg/dL — ABNORMAL HIGH (ref 70–99)
Glucose-Capillary: 97 mg/dL (ref 70–99)
Glucose-Capillary: 122 mg/dL — ABNORMAL HIGH (ref 70–99)
Glucose-Capillary: 121 mg/dL — ABNORMAL HIGH (ref 70–99)

## 2019-09-29 NOTE — Progress Notes (Signed)
Speech Language Pathology Daily Session Note  Patient Details  Name: David [name] MRN: [id number] Date of Birth: [birth year]  Today's Date: 09/29/2019 SLP Individual Time: [id number] SLP Individual Time Calculation (min): 25 min  Short Term Goals: Week 3: SLP Short Term Goal 1 (Week 3): Patient will utilize speech intelligibility strategies at the phrase level to achieve ~90% intelligibility with Mod verbal cues. SLP Short Term Goal 2 (Week 3): Patient will verbalize wants/needs at the sentence level with Mod verbal cues. SLP Short Term Goal 3 (Week 3): Patient will demonstrate sustained attention to functional tasks for 30 minutes with Mod verbal cues for redirection. SLP Short Term Goal 4 (Week 3): Patient will demonstrate orientation to place, time and situation with Mod A multimodal cues. SLP Short Term Goal 5 (Week 3): Patient will consume trials of ice chips with minimal overt s/s of aspiration over 2 sessions with Min verbal cues to assess readiness for repeat MBS SLP Short Term Goal 6 (Week 3): Patient will demonstrate efficient mastication and complete oral clearance with trials of Dys. 2 textures without overt s/s of aspiration over 2 sessions prior to upgrade.  Skilled Therapeutic Interventions: Patient received skilled SLP services targeting dysphagia goals. Patient completed PO trials of dysphagia 2 textures with slow but functional mastication noted. Mild oral residues were observed which cleared with a liquid. Patient completed PO trials of ice chips x10 and thin liquids via single cup sip x5 with an immediate cough noted x1. Otherwise, no overt s/sx of aspiration were observed across PO trials. At the end of therapy session patient was upright in bed with daughter present.  Pain Pain Assessment Pain Scale: 0-10 Pain Score: 0-No pain  Therapy/Group: Individual Therapy  Cristy Folks 09/29/2019, 11:53 AM

## 2019-09-29 NOTE — Progress Notes (Signed)
Occupational Therapy Session Note  Patient Details  Name: David [name] MRN: [id number] Date of Birth: May 17, 1923  Today's Date: 09/29/2019 OT Individual Time: 1310-1410 OT Individual Time Calculation (min): 60 min    Short Term Goals: Week 3:  OT Short Term Goal 1 (Week 3): Pt will transfer with assist of 1 person. OT Short Term Goal 2 (Week 3): Pt will maintain sitting balance with no more than mod A within BADL tasks OT Short Term Goal 3 (Week 3): Pt will assist with pulling pant up in bed OT Short Term Goal 4 (Week 3): Pt will attend to R UE with mod verbal cues within BADL tasks  Skilled Therapeutic Interventions/Progress Updates:    Treatment session with focus on dynamic sitting balance, sit > stand, Rt attention, and functional transfers.  Pt received OOB in TIS w/c with daughters present.  Engaged in squat pivot transfer to Rt with max assist w/c > therapy mat.  Engaged in table top task in sitting with focus on weight shifting and Rt attention.  Therapist providing PROM to RUE and then placed in to WB position during table top task to provide proprioceptive input during task.  Increased challenge to more dynamic sitting balance while incorporating crossing midline with LUE and anterior weight shift to reach forward as needed for self-care tasks.  Progressed to sit > stand with max assist of one caregiver, while positioned under RUE to provide more facilitation for weight shift and standing posture.  Engaged in table top task in standing with focus on reaching across midline for Rt attention, trunk control, and weight shifting.  Completed squat pivot max assist back to w/c with +2 for safety.  Pt remained in TIS w/c with seat belt alarm on, all needs, in reach, and daughter in room.  Therapy Documentation Precautions:  Precautions Precautions: Fall Precaution Comments: R hemi; R inattention Restrictions Weight Bearing Restrictions: No General:   Vital Signs: Therapy  Vitals Temp: 98 F (36.7 C) Temp Source: Oral Pulse Rate: (!) 45 Resp: 18 BP: 122/61 Patient Position (if appropriate): Sitting Oxygen Therapy SpO2: 97 % O2 Device: Room Air Pain: Pain Assessment Pain Scale: 0-10 Pain Score: 5  Pain Type: Acute pain Pain Location: Shoulder Pain Descriptors / Indicators: Discomfort Pain Frequency: Intermittent Pain Onset: On-going Patients Stated Pain Goal: 0 Pain Intervention(s): Medication (See eMAR)   Therapy/Group: Individual Therapy  Simonne Come 09/29/2019, 3:45 PM

## 2019-09-29 NOTE — Progress Notes (Signed)
[place] PHYSICAL MEDICINE & REHABILITATION PROGRESS NOTE   Subjective/Complaints: No complaints Working with therapy  ROS: Limited due to cognitive/behavioral    Objective: No results found. No results for input(s): WBC, HGB, HCT, PLT in the last 72 hours. No results for input(s): NA, K, CL, CO2, GLUCOSE, BUN, CREATININE, CALCIUM in the last 72 hours.  Intake/Output Summary (Last 24 hours) at 09/29/2019 1623 Last data filed at 09/29/2019 1230 Gross per 24 hour  Intake 530 ml  Output --  Net 530 ml     Physical Exam: Vital Signs Blood pressure 122/61, pulse (!) 45, temperature 98 F (36.7 C), temperature source Oral, resp. rate 18, height 5\' 11"  (1.803 m), weight 104.5 kg, SpO2 97 %. Constitutional: No distress . Vital signs reviewed. Standing up with therapy HEENT: EOMI, oral membranes moist Neck: supple Cardiovascular: RRR without murmur. No JVD    Respiratory/Chest: CTA Bilaterally without wheezes or rales. Normal effort    GI/Abdomen: BS +, non-tender, non-distended Ext: no clubbing, cyanosis, or edema Psych: more engaging today Musc: No edema in extremities.  No tenderness in extremities. Neurological:   Aphasic, dysarthric, confused language at times. More appropriate answers today. more alert and attentive.   Trying to articulate more. Flexor tone RUE, extensor tone RLE still present. Both easily ranged. MAS 1-2/4  Skin: numerous bruises/lacs on arms all stable.  Small area redness near sacrum/buttocks stable  Assessment/Plan: 1. Functional deficits secondary to left BG ICH which require 3+ hours per day of interdisciplinary therapy in a comprehensive inpatient rehab setting.  Physiatrist is providing close team supervision and 24 hour management of active medical problems listed below.  Physiatrist and rehab team continue to assess barriers to discharge/monitor patient progress toward functional and medical goals  Care Tool:  Bathing        Body parts  bathed by helper: Front perineal area, Buttocks     Bathing assist Assist Level: 2 Helpers     Upper Body Dressing/Undressing Upper body dressing Upper body dressing/undressing activity did not occur (including orthotics): N/A What is the patient wearing?: Hospital gown only    Upper body assist Assist Level: Dependent - Patient 0%    Lower Body Dressing/Undressing Lower body dressing      What is the patient wearing?: Incontinence brief, Pants     Lower body assist Assist for lower body dressing: Dependent - Patient 0%     Toileting Toileting    Toileting assist Assist for toileting: 2 Helpers     Transfers Chair/bed transfer  Transfers assist  Chair/bed transfer activity did not occur: Safety/medical concerns  Chair/bed transfer assist level: Maximal Assistance - Patient 25 - 49% Chair/bed transfer assistive device: Armrests   Locomotion Ambulation   Ambulation assist   Ambulation activity did not occur: Safety/medical concerns  Assist level: Maximal Assistance - Patient 25 - 49% Assistive device: Other (comment)(L rail) Max distance: 12 ft   Walk 10 feet activity   Assist  Walk 10 feet activity did not occur: Safety/medical concerns  Assist level: Maximal Assistance - Patient 25 - 49% Assistive device: Other (comment)(L rail)   Walk 50 feet activity   Assist Walk 50 feet with 2 turns activity did not occur: Safety/medical concerns         Walk 150 feet activity   Assist Walk 150 feet activity did not occur: Safety/medical concerns         Walk 10 feet on uneven surface  activity   Assist Walk 10 feet on  uneven surfaces activity did not occur: Safety/medical concerns         Wheelchair     Assist Will patient use wheelchair at discharge?: Yes Type of Wheelchair: Manual Wheelchair activity did not occur: Safety/medical concerns(lethargy; TIS would be dependent)         Wheelchair 50 feet with 2 turns  activity    Assist            Wheelchair 150 feet activity     Assist          Blood pressure 122/61, pulse (!) 45, temperature 98 F (36.7 C), temperature source Oral, resp. rate 18, height 5\' 11"  (1.803 m), weight 104.5 kg, SpO2 97 %.  Medical Problem List and Plan: 1.Functional, mobility and cognitive deficitssecondary to left basal ganglia hemorrhage  -recent CT with sl increase in hematoma size/surrounding edema  Continue CIR PT, OT, SLP   -therapies 15/7  5/9: ongoing/increased tone Right side. Not a candidate d/t lethargy for oral antispasmodics.    -PRAFO/AFO, ROM with therapy/family       -arousal still can wax/wane: recent CT of head was stable to improved in appearance.  2. Antithrombotics: -DVT/anticoagulation:Mechanical:Sequential compression devices, below kneeBilateral lower extremities--platelets stable to increased 90k -antiplatelet therapy: N/A 3.Chronic LBP/Pain Management:Tylenol prn.well controlled 4. Mood:LCSW to follow for evaluation and support.  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 Neuropsych: This patientis notcapable of making decisions on hisown behalf.  -  Amantadine increased to 100mg  bid    -ritalin started 5/11 at 2.5mg , increase to 5mg  bid 5/13. Bp/hr ok 6. Skin/Wound Care:Routine pressure relief measures. 7. Fluids/Electrolytes/Nutrition:resumed po diet   5/11 bmet reviewed from 5/10 and stable. Changed IVF to HS  5/14 recheck labs monday 8. HTN:  SBP goal<140--added prn catapres  -resumed low dose tenormin 5/5--> DBP still elevated. Increased to 25mg   Controlled on 5/16 at 122/61  9.H/oCOPD:Stable without meds. 10. Impaired glucose tolerance:CBG's wnl so far.  - SSI as indicated.  5/16 controlled 11. CAD/AVR:Off ASA due to bleed. No statin due to intolerance. Tenormin, cozaar and lasix (used prn) on hold.  -no TEE  indicated 12. Thrombocytopenia:   Platelets decreased to 122 on 5/11. Continue to monitor regularly.  13. AKI: Creatinine 1.11 on 5/11, continue to monitor weekly.  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  Advance diet as tolerated per SLP  See #7  Eating stable.  15. Constipation: Senna at bedtime  -no bm since 5/9, no results with 30cc sorbitol yesterday  5/14-sorbitol 60 cc today, fleet/SS enema if needed  -senkot-s increased ton 5/13  5/15: Had BM yesterday. Regimen modified to two Senna HS and 1 colace 100mg  TID.        LOS: 20 days A FACE TO FACE EVALUATION WAS PERFORMED  Clide Deutscher Dyllon Henken 09/29/2019, 4:23 PM

## 2019-09-29 NOTE — Progress Notes (Signed)
Physical Therapy Session Note  Patient Details  Name: David [name] MRN: [id number] Date of Birth: [birth year]  Today's Date: 09/29/2019 PT Individual Time: 1115-1210 PT Individual Time Calculation (min): 55 min   Short Term Goals: Week 3:  PT Short Term Goal 1 (Week 3): Patient will perform bed mobility with mod A consistently with use of bed rails. PT Short Term Goal 2 (Week 3): Patient will perform basic transfers with mod A. PT Short Term Goal 3 (Week 3): Patient will initiate car transfer training. PT Short Term Goal 4 (Week 3): Patient will ambulate >10 ft with max A using functional AD (not rail).  Skilled Therapeutic Interventions/Progress Updates:     Patient in bed with his daughter in the room upon PT arrival. Patient alert and agreeable to PT session. Patient indicated mild R shoulder pain after mobility at end of session, RN made aware. PT provided repositioning, rest breaks, and distraction as pain interventions throughout session.   Therapeutic Activity: Bed Mobility: Patient performed bridging x2 for adjustment of incontinence brief and pulling pants up in bed, required total A for bending R LE and mod A for facilitation of movement at patient's knees. He pulled his pants up on the L with mod A in bridging, and performed rolling L with mod-max A holding his R UE with his L with hand-over-hand assist and pulled up R side of pants with total A.He performed supine to sit with mod A of 1 person and a second person SBA. Provided verbal cues and manual facilitation for brining R leg over his L, turning his head, rolling to his L side, patient brought off B LEs using his L to slide his R off with min A, provided cues for setting L elbow and pushing to sit up. Transfers: Patient performed sit to/from stand x2 with a L rail with mod-max A and x1 using a RW with R hand splint with max A of 1 person and a second person stabilizing the RW. Provided verbal and manual cues for sequencing, hand  placement, forward weight shift, and hip and knee extension in standing. He performed a squat pivot transfer to the L bed>TIS w/c with max-mod A of 1 person with a second person SBA for safety. Provided cues for head-hips relationship, sequencing, and reaching the L UE.   Gait Training:  Patient ambulated 10-12 feet x2 using a L rail with R DF wrap with max A and total A for R LE advancement, limited distance due to increased faitgue. Ambulated with step-to gait pattern leading with R, crouched gait, R lean, increased trunk flexion, and decreased B step length. Provided verbal cues for erect posture using a mirror in front of him, knee and hip extension, and sequencing.  Wheelchair Mobility:  Patient was transported in the [place] w/c with total A throughout session for energy conservation and time management. Patient appears to be comfortable in the J cushion provided yesterday. Will continue to assess tomorrow per patient and daughter's report.  Neuromuscular Re-ed: Patient performed the following sitting balance and standing balance activities: -sitting EOB patient focused on midline orientation with posterior and mild R lean initially in sitting requiring mod A for balance, able to correct to supervision with cues and manual facilitation -reciprocal scooting forward/backwards x2 with min A for L side and max A for R side to work on isolation of pelvis from trunk and sitting balance with a functional activity -standing balance using a RW and R hand splint with max A  for support, focused on midline orientation erect posture due to strong R lean and crouched posture. Patient with strong LE flexor tone limiting manual facilitation on hip and knee extension in standing.  Patient in [place] w/c with his daughter in the room at end of session with breaks locked, seat belt alarm set, and all needs within reach. Dicussed d/c planning and POC with patient and his daughter. Still considering d/c placement SNF vs  home, patient's daughter hoping for continued CIR to achieve increased level of independence for the patient and reduced caregiver burden. Patient is making progress with therapies and will discuss extending stay with rehab team during conference this week.    Therapy Documentation Precautions:  Precautions Precautions: Fall Precaution Comments: R hemi; R inattention Restrictions Weight Bearing Restrictions: No    Therapy/Group: Individual Therapy  Fontella Shan L [name] PT, DPT  09/29/2019, 4:23 PM

## 2019-09-30 ENCOUNTER — Ambulatory Visit: Payer: Medicare Other | Admitting: Dermatology

## 2019-09-30 ENCOUNTER — Encounter: Payer: Self-pay | Admitting: Internal Medicine

## 2019-09-30 ENCOUNTER — Inpatient Hospital Stay (HOSPITAL_COMMUNITY): Payer: Medicare Other

## 2019-09-30 ENCOUNTER — Inpatient Hospital Stay (HOSPITAL_COMMUNITY): Payer: Medicare Other | Admitting: Occupational Therapy

## 2019-09-30 ENCOUNTER — Inpatient Hospital Stay (HOSPITAL_COMMUNITY): Payer: Medicare Other | Admitting: Speech Pathology

## 2019-09-30 LAB — BASIC METABOLIC PANEL
Anion gap: 8 (ref 5–15)
BUN: 9 mg/dL (ref 8–23)
CO2: 25 mmol/L (ref 22–32)
Calcium: 8.8 mg/dL — ABNORMAL LOW (ref 8.9–10.3)
Chloride: 106 mmol/L (ref 98–111)
Creatinine, Ser: 1.25 mg/dL — ABNORMAL HIGH (ref 0.61–1.24)
GFR calc Af Amer: 57 mL/min — ABNORMAL LOW (ref >60)
GFR calc non Af Amer: 49 mL/min — ABNORMAL LOW (ref >60)
Glucose, Bld: 110 mg/dL — ABNORMAL HIGH (ref 70–99)
Potassium: 4 mmol/L (ref 3.5–5.1)
Sodium: 139 mmol/L (ref 135–145)

## 2019-09-30 LAB — CBC WITH DIFFERENTIAL/PLATELET
Abs Immature Granulocytes: 0.02 10*3/uL (ref 0.00–0.07)
Basophils Absolute: 0 10*3/uL (ref 0.0–0.1)
Basophils Relative: 1 %
Eosinophils Absolute: 0.1 10*3/uL (ref 0.0–0.5)
Eosinophils Relative: 2 %
HCT: 45.2 % (ref 39.0–52.0)
Hemoglobin: 15 g/dL (ref 13.0–17.0)
Immature Granulocytes: 0 %
Lymphocytes Relative: 26 %
Lymphs Abs: 1.7 10*3/uL (ref 0.7–4.0)
MCH: 30.7 pg (ref 26.0–34.0)
MCHC: 33.2 g/dL (ref 30.0–36.0)
MCV: 92.4 fL (ref 80.0–100.0)
Monocytes Absolute: 0.6 10*3/uL (ref 0.1–1.0)
Monocytes Relative: 10 %
Neutro Abs: 4.1 10*3/uL (ref 1.7–7.7)
Neutrophils Relative %: 61 %
Platelets: 97 10*3/uL — ABNORMAL LOW (ref 150–400)
RBC: 4.89 MIL/uL (ref 4.22–5.81)
RDW: 13.6 % (ref 11.5–15.5)
WBC: 6.6 10*3/uL (ref 4.0–10.5)
nRBC: 0 % (ref 0.0–0.2)

## 2019-09-30 LAB — GLUCOSE, CAPILLARY
Glucose-Capillary: 116 mg/dL — ABNORMAL HIGH (ref 70–99)
Glucose-Capillary: 126 mg/dL — ABNORMAL HIGH (ref 70–99)
Glucose-Capillary: 122 mg/dL — ABNORMAL HIGH (ref 70–99)
Glucose-Capillary: 101 mg/dL — ABNORMAL HIGH (ref 70–99)

## 2019-09-30 MED ORDER — SENNOSIDES-DOCUSATE SODIUM 8.6-50 MG PO TABS
2.0000 | ORAL_TABLET | Freq: Two times a day (BID) | ORAL | Status: DC
  Administered 2019-09-30 – 2019-10-07 (×14): 2 via ORAL
  Filled 2019-09-30 (×14): qty 2

## 2019-09-30 NOTE — Progress Notes (Signed)
[facility] PHYSICAL MEDICINE & REHABILITATION PROGRESS NOTE   Subjective/Complaints: Up in bed. Very alert. Complaining that his right arm and leg are still weak  ROS: Patient denies fever, rash, sore throat, blurred vision, nausea, vomiting, diarrhea, cough, shortness of breath or chest pain, joint or back pain, headache, or mood change.    Objective: No results found. Recent Labs    09/30/19 0502  WBC 6.6  HGB 15.0  HCT 45.2  PLT 97*   Recent Labs    09/30/19 0502  NA 139  K 4.0  CL 106  CO2 25  GLUCOSE 110*  BUN 9  CREATININE 1.25*  CALCIUM 8.8*    Intake/Output Summary (Last 24 hours) at 09/30/2019 1323 Last data filed at 09/30/2019 0600 Gross per 24 hour  Intake 760 ml  Output --  Net 760 ml     Physical Exam: Vital Signs Blood pressure (!) 153/78, pulse 75, temperature (!) 97.5 F (36.4 C), temperature source Oral, resp. rate 18, height 5\' 11"  (1.803 m), weight 87.1 kg, SpO2 98 %. Constitutional: No distress . Vital signs reviewed. HEENT: EOMI, oral membranes moist Neck: supple Cardiovascular: RRR without murmur. No JVD    Respiratory/Chest: CTA Bilaterally without wheezes or rales. Normal effort    GI/Abdomen: BS +, non-tender, non-distended Ext: no clubbing, cyanosis, or edema Psych: pleasant and cooperative and extremely engaging today Musc: No edema in extremities.  No tenderness in extremities. Neurological:   Speech much more clear. conversationally more appropriate today.  Flexor tone RUE, extensor tone RLE still present. --> MAS 1/4  Skin: numerous bruises/lacs on arms all stable.  Small area redness near sacrum/buttocks improving  Assessment/Plan: 1. Functional deficits secondary to left BG ICH which require 3+ hours per day of interdisciplinary therapy in a comprehensive inpatient rehab setting.  Physiatrist is providing close team supervision and 24 hour management of active medical problems listed below.  Physiatrist and rehab team  continue to assess barriers to discharge/monitor patient progress toward functional and medical goals  Care Tool:  Bathing        Body parts bathed by helper: Front perineal area, Buttocks     Bathing assist Assist Level: 2 Helpers     Upper Body Dressing/Undressing Upper body dressing Upper body dressing/undressing activity did not occur (including orthotics): N/A What is the patient wearing?: Hospital gown only    Upper body assist Assist Level: Dependent - Patient 0%    Lower Body Dressing/Undressing Lower body dressing      What is the patient wearing?: Incontinence brief, Pants     Lower body assist Assist for lower body dressing: Dependent - Patient 0%     Toileting Toileting    Toileting assist Assist for toileting: 2 Helpers     Transfers Chair/bed transfer  Transfers assist  Chair/bed transfer activity did not occur: Safety/medical concerns  Chair/bed transfer assist level: Maximal Assistance - Patient 25 - 49% Chair/bed transfer assistive device: Armrests   Locomotion Ambulation   Ambulation assist   Ambulation activity did not occur: Safety/medical concerns  Assist level: Maximal Assistance - Patient 25 - 49% Assistive device: Other (comment)(L rail) Max distance: 12 ft   Walk 10 feet activity   Assist  Walk 10 feet activity did not occur: Safety/medical concerns  Assist level: Maximal Assistance - Patient 25 - 49% Assistive device: Other (comment)(L rail)   Walk 50 feet activity   Assist Walk 50 feet with 2 turns activity did not occur: Safety/medical concerns  Walk 150 feet activity   Assist Walk 150 feet activity did not occur: Safety/medical concerns         Walk 10 feet on uneven surface  activity   Assist Walk 10 feet on uneven surfaces activity did not occur: Safety/medical concerns         Wheelchair     Assist Will patient use wheelchair at discharge?: Yes Type of Wheelchair:  Manual Wheelchair activity did not occur: Safety/medical concerns(lethargy; TIS would be dependent)         Wheelchair 50 feet with 2 turns activity    Assist            Wheelchair 150 feet activity     Assist          Blood pressure (!) 153/78, pulse 75, temperature (!) 97.5 F (36.4 C), temperature source Oral, resp. rate 18, height 5\' 11"  (1.803 m), weight 87.1 kg, SpO2 98 %.  Medical Problem List and Plan: 1.Functional, mobility and cognitive deficitssecondary to left basal ganglia hemorrhage  -recent CT with sl increase in hematoma size/surrounding edema  Continue CIR PT, OT, SLP   -therapies 15/7  -spastic right hemiparesis: Not a candidate d/t lethargy for oral antispasmodics.    -PRAFO/AFO, ROM with therapy/family       -arousal still can wax/wane: recent CT of head was stable to improved in appearance. Improved today! Increase therapy to 3hrs/day! 2. Antithrombotics: -DVT/anticoagulation:Mechanical:Sequential compression devices, below kneeBilateral lower extremities--platelets stable to increased 90k -antiplatelet therapy: N/A 3.Chronic LBP/Pain Management:Tylenol prn.well controlled 4. Mood:LCSW to follow for evaluation and support.  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 Neuropsych: This patientis notcapable of making decisions on hisown behalf.  -  Amantadine increased to 100mg  bid    -ritalin started 5/11 at 2.5mg , increase to 5mg  bid 5/13. Bp/hr ok   -continue! 6. Skin/Wound Care:Routine pressure relief measures. 7. Fluids/Electrolytes/Nutrition:resumed po diet   5/11 bmet reviewed from 5/10 and stable. Changed IVF to HS  5/17 labs all stable to improved!   -continue HS ivf  8. HTN:  SBP goal<140--added prn catapres  -resumed low dose tenormin 5/5--> DBP still elevated. Increased to 25mg   Controlled on 5/17    9.H/oCOPD:Stable without  meds. 10. Impaired glucose tolerance:CBG's wnl so far.  - SSI as indicated.  5/16 controlled 11. CAD/AVR:Off ASA due to bleed. No statin due to intolerance. Tenormin, cozaar and lasix (used prn) on hold.  -no TEE indicated 12. Thrombocytopenia:   Platelets decreased to 122 on 5/11.  - drop to 97k 5/17--has been as low as 73k during this admit 13. AKI: Creatinine 1.11 on 5/11, continue to monitor weekly.  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  Advance diet as tolerated per SLP  See #7  Eating stable.  15. Constipation: Senna at bedtime  -no bm since 5/9, no results with 30cc sorbitol yesterday  5/14-sorbitol 60 cc today, fleet/SS enema if needed  -senkot-s increased ton 5/13  5/17: no bm since 5/14, adjust regimen to senna-s 2 tab bid        LOS: 21 days A FACE TO FACE EVALUATION WAS PERFORMED  Meredith Staggers 09/30/2019, 1:23 PM

## 2019-09-30 NOTE — Progress Notes (Signed)
Speech Language Pathology Daily Session Note  Patient Details  Name: David [name] MRN: [id number] Date of Birth: 1923-08-12  Today's Date: 09/30/2019 SLP Individual Time: 1345-1425 SLP Individual Time Calculation (min): 40 min  Short Term Goals: Week 3: SLP Short Term Goal 1 (Week 3): Patient will utilize speech intelligibility strategies at the phrase level to achieve ~90% intelligibility with Mod verbal cues. SLP Short Term Goal 2 (Week 3): Patient will verbalize wants/needs at the sentence level with Mod verbal cues. SLP Short Term Goal 3 (Week 3): Patient will demonstrate sustained attention to functional tasks for 30 minutes with Mod verbal cues for redirection. SLP Short Term Goal 4 (Week 3): Patient will demonstrate orientation to place, time and situation with Mod A multimodal cues. SLP Short Term Goal 5 (Week 3): Patient will consume trials of ice chips with minimal overt s/s of aspiration over 2 sessions with Min verbal cues to assess readiness for repeat MBS SLP Short Term Goal 6 (Week 3): Patient will demonstrate efficient mastication and complete oral clearance with trials of Dys. 2 textures without overt s/s of aspiration over 2 sessions prior to upgrade.  Skilled Therapeutic Interventions: Skilled treatment session focused on dysphagia and speech goals. SLP facilitated session by providing skilled observation with trials of Dys. 2 textures. Patient demonstrated efficient mastication with complete oral clearance and mild right anterior spillage without overt s/s of aspiration. Recommend continued trials prior to upgrade. SLP also facilitated session by providing Min verbal and visual cues for orientation to place and month and participate in a basic conversation that focused on goals of skilled SLP intervention with Min verbal cues for use of speech intelligibility strategies to achieve ~75% intelligibility. Patient left upright in bed with alarm on and all needs within reach.  Continue with current plan of care.      Pain Pain Assessment Pain Scale: 0-10 Pain Score: 0-No pain  Therapy/Group: Individual Therapy  Lane Kjos 09/30/2019, 3:08 PM

## 2019-09-30 NOTE — Progress Notes (Signed)
Occupational Therapy Weekly Progress Note  Patient Details  Name: David [name] MRN: [id number] Date of Birth: 12/19/1923  Beginning of progress report period: September 10, 2019 End of progress report period: Sep 30, 2019  Today's Date: 09/30/2019 OT Individual Time: 1000-1045 OT Individual Time Calculation (min): 45 min    Patient has met 4 of 4 short term goals.  Pt has made steady progress with OT this week. He is able to transfer with max of 1 person with improved sit<>stand and overall body awareness. Pt has improved awareness of L side in general. Pt also has demonstrated some active thumb opposition and finger flex/ext this week! Pt has been more awake, alert, and participates hard in therapy. Continue current plan of care.  Patient continues to demonstrate the following deficits: muscle weakness, decreased cardiorespiratoy endurance, impaired timing and sequencing, abnormal tone, unbalanced muscle activation, motor apraxia, decreased coordination and decreased motor planning, decreased midline orientation, decreased attention to right and decreased motor planning, decreased initiation, decreased attention, decreased awareness, decreased problem solving, decreased safety awareness, decreased memory and delayed processing and decreased sitting balance, decreased standing balance, decreased postural control, hemiplegia and decreased balance strategies and therefore will continue to benefit from skilled OT intervention to enhance overall performance with BADL and Reduce care partner burden.  Patient progressing toward long term goals..  Plan of care revisions: Goals downgraded to moderate assistance overall.  OT Short Term Goals Week 3:  OT Short Term Goal 1 (Week 3): Pt will transfer with assist of 1 person. OT Short Term Goal 1 - Progress (Week 3): Met OT Short Term Goal 2 (Week 3): Pt will maintain sitting balance with no more than mod A within BADL tasks OT Short Term Goal 2 - Progress  (Week 3): Met OT Short Term Goal 3 (Week 3): Pt will assist with pulling pant up in bed OT Short Term Goal 3 - Progress (Week 3): Met OT Short Term Goal 4 (Week 3): Pt will attend to R UE with mod verbal cues within BADL tasks OT Short Term Goal 4 - Progress (Week 3): Met Week 4:  OT Short Term Goal 1 (Week 4): LTG=STG 2/2 ELOS  Skilled Therapeutic Interventions/Progress Updates:   Pt greeted semi-reclined in bed and agreeable to OT treatment session. Pt able to roll to the R with min A of 1 to change brief after urine incontinence. Pt came to sitting EOB with Mod A +2. Worked on sitting balance at EOB with focus on anterior weight shift to reach forward and try to thread LLE through pants. Pt was able to reach down and to the L, but was unable to motor plan lifting LLE to get it into pant leg. Pt needed up to mod A for dynamic sitting balance with any increase task demand of dressing. Total A to don shoes for time management. Stedy used for sit<>stand with pt able to power up with mod A of 1, then OT assist to pull up pants. Worked on sitting balance perched on Stedy at the sink and used mirror feedback to bring trunk to midline. Pt was able to remove L UE from Stedy and help comb his hair with min/supervision for balance perched on Stedy. OT placed SAEBO e-stim on wrist extensors. SAEBO left on for 60 minutes. OT returned to remove SAEBO with skin intact and no adverse reactions.  [place] Stim One 330 pulse width 35 Hz pulse rate On 8 sec/ off 8 sec Ramp up/ down 2 sec Symmetrical  Biphasic wave form  Max intensity 121m at 500 Ohm load  Therapy Documentation Precautions:  Precautions Precautions: Fall Precaution Comments: R hemi; R inattention Restrictions Weight Bearing Restrictions: No Pain:  denies pain   Therapy/Group: Individual Therapy  EValma Cava5/17/2021, 10:51 AM

## 2019-09-30 NOTE — Plan of Care (Signed)
  Problem: Consults Goal: RH GENERAL PATIENT EDUCATION Description: See Patient Education module for education specifics. Outcome: Progressing   Problem: RH BOWEL ELIMINATION Goal: RH STG MANAGE BOWEL WITH ASSISTANCE Description: STG Manage Bowel with mod I Assistance. Outcome: Progressing Goal: RH STG MANAGE BOWEL W/MEDICATION W/ASSISTANCE Description: STG Manage Bowel with Medication with mod I Assistance. Outcome: Progressing

## 2019-09-30 NOTE — Progress Notes (Signed)
Physical Therapy Session Note  Patient Details  Name: David [name] MRN: [id number] Date of Birth: May 31, 1923  Today's Date: 09/30/2019 PT Individual Time: 1120-1205 PT Individual Time Calculation (min): 45 min   Short Term Goals: Week 3:  PT Short Term Goal 1 (Week 3): Patient will perform bed mobility with mod A consistently with use of bed rails. PT Short Term Goal 2 (Week 3): Patient will perform basic transfers with mod A. PT Short Term Goal 3 (Week 3): Patient will initiate car transfer training. PT Short Term Goal 4 (Week 3): Patient will ambulate >10 ft with max A using functional AD (not rail).  Skilled Therapeutic Interventions/Progress Updates:     Patient in [place] w/c with his daughter in the room upon PT arrival. Patient alert and agreeable to PT session. Patient denied pain during session and did not display any signs of pain throughout session. Noted improved speech volume, clarity, vocalization, and orientation throughout session. Frequently expresses frustration about his R side weakness with mobility. Provided general education about neuromotor recovery and patient's progress throughout session.  Therapeutic Activity: Bed Mobility: Patient performed sit to supine with mod A of 1 person and second person SBA for safety. Provided verbal cues and facilitation for patient to lean onto his L elbow then initiate bringing LE's up to the bed as he laid on his L side and rolled to his back. Required max A for positioning of trunk and LE's and scooting up in bed with +2 assist.  Transfers: Patient performed sit to/from stand x2 using a L rail with mod-max A of 1 person and using a RW with max A of 2 people for physical support, stabilizing RW, and maintaining R UE in R hand splint. Provided verbal cues for forward weight shift, hand placement, and hip, knee, and trunk extension. Patient performed lateral scoot transfers TIS w/c<>mat table L/R with max A of 2 people and TIS w/c>bed using  bed rail with L hand with mod-min A +2. Provided cues for hand placement and head-hips relationship for proper technique and decreased assist with transfers.   Gait Training:  Patient ambulated ~15 feet x2 using L rail and L DF wrapwith max A and w/c follow for safety. Ambulated with improved hip, knee, and trunk extension this session with cues and manual facilitation. Required max-total A for R LE advancement with increased initiation, while still intermittent, this session. Continues to push to the R and have increased adductor tone during R swing phase. Provided verbal cues for sequencing throughout.  Wheelchair Mobility:  Patient was transported in the [place] w/c with total A throughout session for energy conservation and time management.  Neuromuscular Re-ed: Patient performed the following sitting and standing balance activities: -sitting EOM and EOB patient required supervision for sitting balance this session and was able to follow cues for forward weight shift to maintain balance as PT applied L DF wrap EOM and doffed shoes EOB -Performed sit to/from stand with 3 musketeers technique x2 for increased use of LEs, required max +2 with PT blocking R LE and providing manual facilitation for trunk and hip extension. Utilized a Geologist, engineering for [employer] as patient performed standing balance x30-40 sec x2 focused on posture, midline orientation, and R LE and trunk extension in standing.  Patient in bed with his daughter in the room with [facility] donned (by daughter) and R UE elevated at end of session with breaks locked, bed alarm set, and all needs within reach. Patient's daughter reported that he  was very uncomfortable on the J cushion this morning. PT assessed Roho hybrid cushion and noted over inflation. Let out air and tested cushion with improved comfort, will have patient trial hybrid cushion next time he is OOB.    Therapy Documentation Precautions:  Precautions Precautions: Fall Precaution  Comments: R hemi; R inattention Restrictions Weight Bearing Restrictions: No    Therapy/Group: Individual Therapy  Lynelle Weiler L Kynzee Devinney PT, DPT  09/30/2019, 5:18 PM

## 2019-09-30 NOTE — Plan of Care (Signed)
  Problem: RH Dressing Goal: LTG Patient will perform lower body dressing w/assist (OT) Description: LTG: Patient will perform lower body dressing with assist, with/without cues in positioning using equipment (OT) Outcome: Not Applicable Flowsheets (Taken 09/30/2019 1315) LTG: Pt will perform lower body dressing with assistance level of: (dc goal 5/17-ESD) -- Note: D/c goal 5/17 2.2 lack of progress   Problem: RH Toileting Goal: LTG Patient will perform toileting task (3/3 steps) with assistance level (OT) Description: LTG: Patient will perform toileting task (3/3 steps) with assistance level (OT)  Outcome: Not Applicable Flowsheets (Taken 09/30/2019 1315) LTG: Pt will perform toileting task (3/3 steps) with assistance level: (dc goal 5/17-ESD) -- Note: D/c goal 5/17 due to lack of progress-ESD   Problem: RH Tub/Shower Transfers Goal: LTG Patient will perform tub/shower transfers w/assist (OT) Description: LTG: Patient will perform tub/shower transfers with assist, with/without cues using equipment (OT) Outcome: Not Applicable Flowsheets (Taken 09/30/2019 1315) LTG: Pt will perform tub/shower stall transfers with assistance level of: (dc goal 5/17) -- Note: D/c goal 5/17 due to lack of progress

## 2019-09-30 NOTE — Progress Notes (Signed)
Nutrition Follow-up  RD working remotely.  DOCUMENTATION CODES:   Obesity unspecified  INTERVENTION:   - Snacks TID between meals  -MagicCup TID with meals, each supplement provides 290 kcal and 9 grams of protein  - Encourage adequate PO intake and provide feeding assistance  NUTRITION DIAGNOSIS:   Inadequate oral intake related to dysphagia, lethargy/confusion as evidenced by meal completion < 25%.  Progressing  GOAL:   Patient will meet greater than or equal to 90% of their needs  Progressing  MONITOR:   Supplement acceptance, Diet advancement, Labs, PO intake, Weight trends  REASON FOR ASSESSMENT:   Malnutrition Screening Tool    ASSESSMENT:   86 year old male with PMH of CAD, COPD, LBP, tremors, AVR. Pt was admitted on 09/04/19 with right facial weakness, dysarthria, right hemiparesis, and left gaze preference. CT head done showing acute left ICH. 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 Pt with resultant dysphagia, aphasia, delayed processing. Pt admitted to CIR on 4/26.  Unable to reach pt via phone call to room. Overall, meal completions have improved. Pt has been doing trials of dysphagia 2 textures with SLP.  Weight down 38 lbs from 09/23/19 to 09/30/19. Question accuracy given significant drop in weight over just 1 week time period. Weight has been trending down overall. Will continue to monitor trends.  Per RN edema assessment, pt with mild pitting generalized edema and mild pitting edema to BUE.  Meal Completion: 25-75%  Medications reviewed and include: SSI, Ritalin, protonix, senna IVF: NS with KCl @ 50 ml/hr q HS  Labs reviewed. CBG's: 97-126 x 24 hours  Diet Order:   Diet Order            DIET - DYS 1 Room service appropriate? Yes; Fluid consistency: Honey Thick  Diet effective now              EDUCATION NEEDS:    No education needs have been identified at this time  Skin:  Skin Assessment: Reviewed RN Assessment  Last BM:  09/28/19  Height:   Ht Readings from Last 1 Encounters:  09/23/19 5\' 11"  (1.803 m)    Weight:   Wt Readings from Last 1 Encounters:  09/30/19 87.1 kg    Ideal Body Weight:  78.2 kg  BMI:  Body mass index is 26.78 kg/m.  Estimated Nutritional Needs:   Kcal:  1800-2000  Protein:  95-110 grams  Fluid:  1.8-2.0 L    Gaynell Face, MS, RD, LDN Inpatient Clinical Dietitian Pager: [phone] Weekend/After Hours: [phone]

## 2019-10-01 ENCOUNTER — Encounter (HOSPITAL_COMMUNITY): Payer: Medicare Other | Admitting: Speech Pathology

## 2019-10-01 ENCOUNTER — Inpatient Hospital Stay (HOSPITAL_COMMUNITY): Payer: Medicare Other | Admitting: Occupational Therapy

## 2019-10-01 ENCOUNTER — Inpatient Hospital Stay (HOSPITAL_COMMUNITY): Payer: Medicare Other

## 2019-10-01 ENCOUNTER — Inpatient Hospital Stay (HOSPITAL_COMMUNITY): Payer: Medicare Other | Admitting: Physical Therapy

## 2019-10-01 LAB — GLUCOSE, CAPILLARY
Glucose-Capillary: 115 mg/dL — ABNORMAL HIGH (ref 70–99)
Glucose-Capillary: 136 mg/dL — ABNORMAL HIGH (ref 70–99)
Glucose-Capillary: 115 mg/dL — ABNORMAL HIGH (ref 70–99)
Glucose-Capillary: 107 mg/dL — ABNORMAL HIGH (ref 70–99)

## 2019-10-01 MED ORDER — SORBITOL 70 % SOLN
60.0000 mL | Status: AC
  Filled 2019-10-01 (×2): qty 60

## 2019-10-01 NOTE — Progress Notes (Signed)
[place] PHYSICAL MEDICINE & REHABILITATION PROGRESS NOTE   Subjective/Complaints: Up in bed, getting ready for SLP  ROS: Limited due to cognitive/behavioral     Objective: DG Swallowing Func-Speech Pathology  Result Date: 10/01/2019 Objective Swallowing Evaluation: Type of Study: MBS-Modified Barium Swallow Study  Patient Details Name: David [name] MRN: [id number] Date of Birth: 11/06/1923 Today's Date: 10/01/2019 Time: SLP Start Time (ACUTE ONLY): 0900 -SLP Stop Time (ACUTE ONLY): 0930 SLP Time Calculation (min) (ACUTE ONLY): 30 min Past Medical History: Past Medical History: Diagnosis Date . CAD (coronary artery disease)   intolerance of statins . COPD (chronic obstructive pulmonary disease) ([place])  . Current use of long term anticoagulation  . GERD (gastroesophageal reflux disease)  . Hyperlipidemia  . Hypertension  . Impaired glucose tolerance 02/14/2011 . LBP (low back pain)  . OA (osteoarthritis)  . S/P AVR (aortic valve replacement) 2009  bonine valve Past Surgical History: Past Surgical History: Procedure Laterality Date . AORTIC VALVE REPLACEMENT  01/15/1999  bovine . CARDIAC CATHETERIZATION  10/05/1998  Recommend CABG revascularizaition . CARDIAC CATHETERIZATION  06/11/1999  Continue medical therapy . CARDIAC CATHETERIZATION  01/08/2007  Patent grafts, recommend medical therapy . CARDIOVASCULAR STRESS TEST  04/22/2010  Small lateral defect which is partially reversible, no ischemic EKG changes were noted. Marland Kitchen CAROTID DOPPLER  04/22/2010  Bilateral ICAs-no evidence of diametere reduction, significant tortuosity, or any other vascular abnormality. . CORONARY ARTERY BYPASS GRAFT  10/09/1998  LIMA to LAD, SVG to diag, SVG sequential to 2 marginals, SVG sequential to PDA & PLA. CE#25 porcine AVR. Marland Kitchen TRANSTHORACIC ECHOCARDIOGRAM  05/14/2012  EF 50-55%, bioprosthesis of the aortic valve with well preserved LV function, moderate concentric [place] HPI: See H&P  Subjective: alert Assessment / Plan /  Recommendation CHL IP CLINICAL IMPRESSIONS 10/01/2019 Clinical Impression Patient presents with a moderate oropharyngeal dysphagia characterized by intermittent premature loss of bolus into the pharynx with delayed and inconsistent onset of pharyngeal swallow.  Inconsistent timing of his swallow resulted in flash penetration with nectar-thick liquids and intermittent silent aspiration of thin liquids. A cued cough/throat clear was effective in clearing the majority of the aspirates.  Moderate oral reside was noted with solid textures that required a liquid wash to clear.  Recommend patient upgrade to nectar-thick liquids and initiate the water protocol with focus on quick, single sips of thin liquids.  Educated daughter regarding results, she verbalized understanding. SLP Visit Diagnosis Dysphagia, oropharyngeal phase (R13.12) Attention and concentration deficit following -- Frontal lobe and executive function deficit following -- Impact on safety and function Mild aspiration risk;Moderate aspiration risk   CHL IP TREATMENT RECOMMENDATION 10/01/2019 Treatment Recommendations Therapy as outlined in treatment plan below   Prognosis 10/01/2019 Prognosis for Safe Diet Advancement Good Barriers to Reach Goals -- Barriers/Prognosis Comment -- CHL IP DIET RECOMMENDATION 10/01/2019 SLP Diet Recommendations Dysphagia 1 (Puree) solids;Nectar thick liquid;Free water protocol after oral care Liquid Administration via Cup;Straw Medication Administration Crushed with puree Compensations Minimize environmental distractions;Slow rate;Small sips/bites;Follow solids with liquid;Monitor for anterior loss Postural Changes --   CHL IP OTHER RECOMMENDATIONS 10/01/2019 Recommended Consults -- Oral Care Recommendations Oral care BID Other Recommendations Order thickener from pharmacy;Prohibited food (jello, ice cream, thin soups);Clarify dietary restrictions;Remove water pitcher;Have oral suction available   CHL IP FOLLOW UP RECOMMENDATIONS  10/01/2019 Follow up Recommendations Inpatient Rehab   CHL IP FREQUENCY AND DURATION 10/01/2019 Speech Therapy Frequency (ACUTE ONLY) min 3x week Treatment Duration 2 weeks      CHL IP ORAL PHASE 10/01/2019 Oral Phase  Impaired Oral - Pudding Teaspoon -- Oral - Pudding Cup -- Oral - Honey Teaspoon NT Oral - Honey Cup NT Oral - Nectar Teaspoon NT Oral - Nectar Cup Delayed oral transit;Right pocketing in lateral sulci;Premature spillage;Decreased bolus cohesion Oral - Nectar Straw Delayed oral transit;Decreased bolus cohesion;Premature spillage Oral - Thin Teaspoon NT Oral - Thin Cup -- Oral - Thin Straw Decreased bolus cohesion;Premature spillage;Delayed oral transit;Right anterior bolus loss Oral - Puree Right anterior bolus loss Oral - Mech Soft Impaired mastication;Lingual/palatal residue Oral - Regular -- Oral - Multi-Consistency -- Oral - Pill -- Oral Phase - Comment --  CHL IP PHARYNGEAL PHASE 10/01/2019 Pharyngeal Phase Impaired Pharyngeal- Pudding Teaspoon -- Pharyngeal -- Pharyngeal- Pudding Cup -- Pharyngeal -- Pharyngeal- Honey Teaspoon NT Pharyngeal -- Pharyngeal- Honey Cup NT Pharyngeal -- Pharyngeal- Nectar Teaspoon NT Pharyngeal -- Pharyngeal- Nectar Cup Delayed swallow initiation-vallecula;Penetration/Aspiration during swallow Pharyngeal Material enters airway, remains ABOVE vocal cords then ejected out Pharyngeal- Nectar Straw Delayed swallow initiation-vallecula;Penetration/Aspiration during swallow Pharyngeal Material enters airway, remains ABOVE vocal cords then ejected out Pharyngeal- Thin Teaspoon NT Pharyngeal -- Pharyngeal- Thin Cup Delayed swallow initiation-pyriform sinuses;Penetration/Aspiration during swallow Pharyngeal Material enters airway, passes BELOW cords without attempt by patient to eject out (silent aspiration);Material enters airway, CONTACTS cords and then ejected out;Material enters airway, remains ABOVE vocal cords and not ejected out;Material does not enter airway Pharyngeal-  Thin Straw Penetration/Aspiration during swallow;Delayed swallow initiation-pyriform sinuses Pharyngeal Material enters airway, CONTACTS cords and not ejected out Pharyngeal- Puree WFL Pharyngeal -- Pharyngeal- Mechanical Soft -- Pharyngeal -- Pharyngeal- Regular -- Pharyngeal -- Pharyngeal- Multi-consistency -- Pharyngeal -- Pharyngeal- Pill -- Pharyngeal -- Pharyngeal Comment --  No flowsheet data found. PAYNE, COURTNEY 10/01/2019, 9:58 AM    Weston Anna, MA, CCC-SLP [phone]           Recent Labs    09/30/19 0502  WBC 6.6  HGB 15.0  HCT 45.2  PLT 97*   Recent Labs    09/30/19 0502  NA 139  K 4.0  CL 106  CO2 25  GLUCOSE 110*  BUN 9  CREATININE 1.25*  CALCIUM 8.8*    Intake/Output Summary (Last 24 hours) at 10/01/2019 1138 Last data filed at 10/01/2019 0830 Gross per 24 hour  Intake 610 ml  Output --  Net 610 ml     Physical Exam: Vital Signs Blood pressure (!) 145/77, pulse 71, temperature 97.6 F (36.4 C), temperature source Oral, resp. rate 18, height 5\' 11"  (1.803 m), weight 85.3 kg, SpO2 95 %. Constitutional: No distress . Vital signs reviewed. HEENT: EOMI, oral membranes moist Neck: supple Cardiovascular: RRR without murmur. No JVD    Respiratory/Chest: CTA Bilaterally without wheezes or rales. Normal effort    GI/Abdomen: BS +, non-tender, non-distended Ext: no clubbing, cyanosis, or edema Psych: pleasant, more engaging and positive Musc: No edema in extremities.  No tenderness in extremities. Neurological:   Speech remains much more clear. conversationally more appropriate.  Flexor tone RUE, extensor tone RLE still present. --> MAS 1 to 1+/4  Skin: numerous bruises/lacs on arms all stable.  Small area redness near sacrum/buttocks improving  Assessment/Plan: 1. Functional deficits secondary to left BG ICH which require 3+ hours per day of interdisciplinary therapy in a comprehensive inpatient rehab setting.  Physiatrist is providing close team supervision  and 24 hour management of active medical problems listed below.  Physiatrist and rehab team continue to assess barriers to discharge/monitor patient progress toward functional and medical goals  Care Tool:  Bathing  Body parts bathed by helper: Front perineal area, Buttocks     Bathing assist Assist Level: 2 Helpers     Upper Body Dressing/Undressing Upper body dressing Upper body dressing/undressing activity did not occur (including orthotics): N/A What is the patient wearing?: Hospital gown only    Upper body assist Assist Level: Dependent - Patient 0%    Lower Body Dressing/Undressing Lower body dressing      What is the patient wearing?: Incontinence brief, Pants     Lower body assist Assist for lower body dressing: Dependent - Patient 0%     Toileting Toileting    Toileting assist Assist for toileting: 2 Helpers     Transfers Chair/bed transfer  Transfers assist  Chair/bed transfer activity did not occur: Safety/medical concerns  Chair/bed transfer assist level: 2 Helpers Chair/bed transfer assistive device: Armrests   Locomotion Ambulation   Ambulation assist   Ambulation activity did not occur: Safety/medical concerns  Assist level: Maximal Assistance - Patient 25 - 49% Assistive device: Other (comment)(L rail) Max distance: 15'   Walk 10 feet activity   Assist  Walk 10 feet activity did not occur: Safety/medical concerns  Assist level: Maximal Assistance - Patient 25 - 49% Assistive device: Other (comment)(L rail)   Walk 50 feet activity   Assist Walk 50 feet with 2 turns activity did not occur: Safety/medical concerns         Walk 150 feet activity   Assist Walk 150 feet activity did not occur: Safety/medical concerns         Walk 10 feet on uneven surface  activity   Assist Walk 10 feet on uneven surfaces activity did not occur: Safety/medical concerns         Wheelchair     Assist Will patient use  wheelchair at discharge?: Yes Type of Wheelchair: Manual Wheelchair activity did not occur: Safety/medical concerns(lethargy; TIS would be dependent)         Wheelchair 50 feet with 2 turns activity    Assist            Wheelchair 150 feet activity     Assist          Blood pressure (!) 145/77, pulse 71, temperature 97.6 F (36.4 C), temperature source Oral, resp. rate 18, height 5\' 11"  (1.803 m), weight 85.3 kg, SpO2 95 %.  Medical Problem List and Plan: 1.Functional, mobility and cognitive deficitssecondary to left basal ganglia hemorrhage  -Continue CIR PT, OT, SLP   -therapy intensity increased back to 3hr per day  -spastic right hemiparesis: Not a candidate d/t lethargy for oral antispasmodics. ?botox as outpt   -PRAFO/AFO, ROM with therapy/family       -arousal still can wax/wane: recent CT of head was stable to improved in appearance.  2. Antithrombotics: -DVT/anticoagulation:Mechanical:Sequential compression devices, below kneeBilateral lower extremities--platelets stable to increased 90k -antiplatelet therapy: N/A 3.Chronic LBP/Pain Management:Tylenol prn.well controlled 4. Mood:LCSW to follow for evaluation and support.  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 Neuropsych: This patientis notcapable of making decisions on hisown behalf.  -  Amantadine increased to 100mg  bid    -ritalin started 5/11 at 2.5mg , increase to 5mg  bid 5/13. Bp/hr ok   -continue! 6. Skin/Wound Care:Routine pressure relief measures. 7. Fluids/Electrolytes/Nutrition:resumed po diet   5/11 bmet reviewed from 5/10 and stable. Changed IVF to HS  5/17 labs all stable to improved!  5/18 given fluid upgrade will see how he does off hs IVF 8. HTN:  SBP goal<140--added prn catapres  -resumed low dose tenormin 5/5--> DBP still elevated. Increased to 25mg   Controlled on 5/18     9.H/oCOPD:Stable without meds. 10. Impaired glucose tolerance:CBG's wnl so far.  - SSI as indicated.  5/16 controlled 11. CAD/AVR:Off ASA due to bleed. No statin due to intolerance. Tenormin, cozaar and lasix (used prn) on hold.  -no TEE indicated 12. Thrombocytopenia:   Platelets decreased to 122 on 5/11.  - drop to 97k 5/17--has been as low as 73k during this admit 13. AKI: Creatinine 1.11 on 5/11, continue to monitor weekly.  14.  Post stroke dysphagia:               -aspiration precautions  -had MBS today, still pharyngeal dysphagia.    -diet changed to D1/nectars 15. Constipation: Senna at bedtime  - no bm since 5/14  5/18---sorbitol 60cc today, SSE if needed       LOS: 22 days A FACE TO [place] 10/01/2019, 11:38 AM

## 2019-10-01 NOTE — Patient Care Conference (Signed)
Inpatient RehabilitationTeam Conference and Plan of Care Update Date: 10/01/2019   Time: 2:28 PM    Patient Name: David [name]      Medical Record Number: [id number]  Date of Birth: 24-Jan-1924 Sex: Male         Room/Bed: 4W06C/4W06C-01 Payor Info: Payor: MEDICARE / Plan: MEDICARE PART A AND B / Product Type: *No Product type* /    Admit Date/Time:  09/09/2019  9:40 PM  Primary Diagnosis:  ICH (intracerebral hemorrhage) ([place])  Patient Active Problem List   Diagnosis Date Noted  . Cognitive and neurobehavioral dysfunction   . Labile blood pressure   . Dysphagia, post-stroke   . AKI (acute kidney injury) ([place])   . Labile blood glucose   . Coronary artery disease involving native coronary artery of native heart without angina pectoris   . Chronic obstructive pulmonary disease ([place])   . Tremors of nervous system   . S/P AVR   . Thrombocytopenia ([place])   . Global aphasia   . ICH (intracerebral hemorrhage) ([place]) 09/04/2019  . Tremor 05/20/2019  . 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 Chest pain, atypical 09/20/2018  . Onychomycosis 06/06/2018  . Acute pain of left knee 08/18/2017  . Wart 06/05/2017  . Acute upper respiratory infection 05/12/2016  . Skin irritation from shaving 12/02/2015  . Benign paroxysmal positional vertigo 03/09/2015  . Plantar fasciitis, bilateral 12/02/2014  . Constipation 07/17/2014  . Cough 04/09/2014  . S/P AVR (aortic valve replacement) 11/04/2013  . Elevated hemoglobin ([place]) 12/14/2012  . Well adult exam 11/12/2012  . 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 Impaired glucose tolerance 02/14/2011  . Edema 10/22/2010  . Actinic keratoses 10/22/2010  . THROMBOCYTOPENIA 01/22/2010  . MELANOMA 03/05/2009  . TOBACCO USE, QUIT 02/27/2009  . Neoplasm of uncertain behavior of skin 05/08/2007  . Dyslipidemia 02/08/2007  . Essential hypertension 02/08/2007  . Coronary atherosclerosis 02/08/2007  . COPD 02/08/2007  . GERD 02/08/2007  . OSTEOARTHRITIS  02/08/2007  . LOW BACK PAIN 02/08/2007    Expected Discharge Date: Expected Discharge Date: 10/08/19  Team Members Present: Physician leading conference: Dr. Alger Simons Care Coodinator Present: Loralee Pacas, LCSWA;Christina Sampson Goon, BSW;Other (comment)(Laurielle Selmon Creig Hines, RN, BSN, CRRN) Nurse Present: Ellison Carwin, LPN PT Present: Apolinar Junes, PT OT Present: Cherylynn Ridges, OT SLP Present: Weston Anna, SLP PPS Coordinator present : Ileana Ladd, Burna Mortimer, SLP     Current Status/Progress Goal Weekly Team Focus  Bowel/Bladder   Incontient/Continent of Bladder/Bowel ,  Maintain normal bowel pattern  Toileting Q2hrs and PRNprove schedule and prn stool softener/ laxative   Swallow/Nutrition/ Hydration   Dys. 1 textures with nectar-thick liquids, supervision-Min A, initiate water protocol  Supervision  tolerance of diet upgrade, trials of thin   ADL's   Mod/max A of 1 transfer to L, Max+2 transfer to R, Mod A transfer with Charlaine Dalton, Max A BADLs  Mod a overall  dc planning, self-care retraining, R UE NMR, NMES, R attention, initiation, cognition, activity tolerance   Mobility   Min-max A rolling, mod-max of 1 person supine<>sit, supervision to mod A for sitting balance, mod to max +1 lateral scoot to L, mod to max +2 to the R, mod A Stedy with second assist for transport due to R lean, mod A sit<>stand at L rail, mod-max A +1 and w/c follow for gait 29 ft with L rail and DF wrap and max-total A for R LE advancement  Min A overall, supervision w/c mobility  bed mobility, sitting balance/reorientation to midline,  transfers, standing balance/orientation, gait training, w/c mobility as able, cognitive remediation (simple commands, attention, motor planning), R NMR, gait training, patient/caregiver education   Communication   Min A  Min A  use of speech intelligibility strategies at the phrase and sentence level   Safety/Cognition/ Behavioral Observations  Mod-Max A   Min A  basic problem solving, orienraiton, attention   Pain   Patient occassional c/o headachem shoulder pain and generalized pain prn Tylenol admimistered  pt will be free of pain  QS/PRN assessment and evaluate concern   Skin   Skin intact, Ecchymosis areas to bilateral arms/legs resolving, small abrasions,Redness to sacrum( MASD)schedule cream applied  To prevent skin from further breakdown/infection  Assess QS/PRN, reposition turned Q2 hrs/prn,    Rehab Goals Patient on target to meet rehab goals: Yes Rehab Goals Revised: on target with current goals *See Care Plan and progress notes for long and short-term goals.     Barriers to Discharge  Current Status/Progress Possible Resolutions Date Resolved   Nursing                  PT  Decreased caregiver support;Lack of/limited family support;Behavior;Incontinence  Patient's daughter can only provide minimal-CGA at d/c with intermittent assist from her sister; patient with impraired cognition and speech limiting progress due to poor recall and ability to learn new tasks  Improved cognition this week, family considering SNF at d/c           OT                  SLP                Care Coordinator Medical stability;Lack of/limited family support              Discharge Planning/Teaching Needs:  plans to discharge home with [facility] or SNF placement      Team Discussion: Patient experiencing constipation and incontinence.   Revisions to Treatment Plan: Speech Therapy to begin Water Protocol and Tray trial of Dys 2 textures.     Medical Summary Current Status: improving arousal, speech. confusion less also. still with dense right hemiparesis, tone Weekly Focus/Goal: diet upgrade/tolerance, sleep-wake cycle maximization, bp control  Barriers to Discharge: Medical stability   Possible Resolutions to Barriers: moving off IVF to orals alone, adjusting meds to maximize activation   Continued Need for Acute Rehabilitation Level of Care: The  patient requires daily medical management by a physician with specialized training in physical medicine and rehabilitation for the following reasons: Direction of a multidisciplinary physical rehabilitation program to maximize functional independence : Yes Medical management of patient stability for increased activity during participation in an intensive rehabilitation regime.: Yes Analysis of laboratory values and/or radiology reports with any subsequent need for medication adjustment and/or medical intervention. : Yes   I attest that I was present, lead the team conference, and concur with the assessment and plan of the team.   Cristi Loron 10/01/2019, 2:28 PM

## 2019-10-01 NOTE — Progress Notes (Signed)
Physical Therapy Session Note  Patient Details  Name: David [name] MRN: [id number] Date of Birth: 06/23/1923  Today's Date: 10/01/2019 PT Individual Time: [id number] PT Individual Time Calculation (min): 29 min   Short Term Goals: Week 4:  PT Short Term Goal 1 (Week 4): STG=LTG due to ELOS  Skilled Therapeutic Interventions/Progress Updates:    Pt received supine, asleep in bed with his daughter present and upon awakening pt agreeable to therapy session. Pt repeatedly states "I can't" when cued to perform movement with R hemibody during session - therapist provided repeated cuing/education to attempt movement prior to stating he can't. Therapist also educated pt's daughter on floating R heel with pillows while pt in bed if it is not yet time for him to wear PRAFO. Supine>sitting L EOB with max assist for trunk upright and pivoting hips to EOB using bed pad. Participated in sitting EOB trunk control task to reach R/L with L UE while therapist positioned R UE for weightbearing - able to reach slightly outside BOS with close supervision and intermittent CGA/min assist to regain midline between reaching tasks. Pt reporting fatigue. Sit>supine with max assist for trunk descent and B LE management up into the bed. Therapist facilitated B LEs into hooklying position and pt performed x10 reps bridges with pt able to clear L hip but only slightly clear R hip - multimodal cuing for increased R glute activation. Pt left supine in bed with needs in reach and bed alarm on.  Therapy Documentation Precautions:  Precautions Precautions: Fall Precaution Comments: R hemi; R inattention Restrictions Weight Bearing Restrictions: No  Pain: Pt reports "I hurt all over" but declined medication at this time stating "I take so many pills and they don't do anything." Therapist provided rest breaks and repositioning for pain management.   Therapy/Group: Individual Therapy  Tawana Scale, PT, DPT 10/01/2019, 3:38  PM

## 2019-10-01 NOTE — Progress Notes (Signed)
Team Conference Report to Patient/Family  Team Conference discussion was reviewed with the patient and caregiver, including goals, any changes in plan of care and target discharge date.  Patient and caregiver express understanding and are in agreement.  The patient has a target discharge date of 10/08/19. Daughter looking to extend MD will follow up. SW began [facility] and has placement options from daughter. Will begin following up with facilities.   David [name] 10/01/2019, 3:40 PM Patient ID: David [name], male   DOB: 03/21/1924, 86 y.o.   MRN: [id number]

## 2019-10-01 NOTE — Progress Notes (Signed)
Modified Barium Swallow Progress Note  Patient Details  Name: THADDUS GIKAS MRN: [id number] Date of Birth: 1923/08/26  Today's Date: 10/01/2019  Modified Barium Swallow completed.  Full report located under Chart Review in the Imaging Section.  Brief recommendations include the following:  Clinical Impression  Patient presents with a moderate oropharyngeal dysphagia characterized by intermittent premature loss of bolus into the pharynx with delayed and inconsistent onset of pharyngeal swallow.  Inconsistent timing of his swallow resulted in flash penetration with nectar-thick liquids and intermittent silent aspiration of thin liquids. A cued cough/throat clear was effective in clearing the majority of the aspirates.  Moderate oral reside was noted with solid textures that required a liquid wash to clear.  Recommend patient upgrade to nectar-thick liquids and initiate the water protocol with focus on quick, single sips of thin liquids.  Educated daughter regarding results, she verbalized understanding.   Swallow Evaluation Recommendations       SLP Diet Recommendations: Dysphagia 1 (Puree) solids;Nectar thick liquid;Free water protocol after oral care   Liquid Administration via: Cup;Straw   Medication Administration: Crushed with puree   Supervision: Patient able to self feed;Full supervision/cueing for compensatory strategies   Compensations: Minimize environmental distractions;Slow rate;Small sips/bites;Follow solids with liquid;Monitor for anterior loss       Oral Care Recommendations: Oral care BID   Other Recommendations: Order thickener from pharmacy;Prohibited food (jello, ice cream, thin soups);Clarify dietary restrictions;Remove water pitcher;Have oral suction available    Kalandra Masters 10/01/2019,9:57 AM

## 2019-10-01 NOTE — Plan of Care (Signed)
  Problem: Consults Goal: RH GENERAL PATIENT EDUCATION Description: See Patient Education module for education specifics. Outcome: Progressing Goal: Skin Care Protocol Initiated - if Braden Score 18 or less Description: Patients skin will be free from break down Outcome: Progressing   Problem: RH BOWEL ELIMINATION Goal: RH STG MANAGE BOWEL WITH ASSISTANCE Description: STG Manage Bowel with mod I Assistance. Outcome: Progressing Goal: RH STG MANAGE BOWEL W/MEDICATION W/ASSISTANCE Description: STG Manage Bowel with Medication with mod I Assistance. Outcome: Progressing

## 2019-10-01 NOTE — Progress Notes (Signed)
Physical Therapy Weekly Progress Note  Patient Details  Name: David [name] MRN: [id number] Date of Birth: Dec 15, 1923  Beginning of progress report period: Sep 24, 2019 End of progress report period: Oct 01, 2019  Today's Date: 10/01/2019 PT Individual Time: 1101-1200 PT Individual Time Calculation (min): 59 min   Patient has met 0 of 4 short term goals.  Patient with slow progress this week. He has maintained consistent assist levels with functional mobility and demonstrated some motor control in R UE and LE this week. Increased patients therapy frequency to 3+ hours per day, as patient is tolerating therapy sessions well, to reduce level of assist prior to d/c to next level of care. He currently requires min-max A rolling, mod-max of 1 person supine<>sit, supervision to mod A for sitting balance, mod to max +1 lateral scoot transfers to the L, max +2 to the R, mod A Stedy transfers with second assist for transport due to R lean, mod A sit<>stand at L rail, mod-max A +1 and w/c follow for gait 29 ft with L rail and DF wrap and max-total A for R LE advancement. Patient continues to be limited by aphasia and dysarthria for effective communication (improved this week), R inattention (improved), decreased orientation (intermittent), decreased spacial awareness, and dense R hemiplegia.   Patient continues to demonstrate the following deficits muscle weakness and muscle joint tightness, decreased cardiorespiratoy endurance, abnormal tone, unbalanced muscle activation and decreased motor planning, decreased midline orientation, decreased attention to right and decreased motor planning, decreased awareness, decreased problem solving, decreased safety awareness, decreased memory and delayed processing and decreased sitting balance, decreased standing balance, decreased postural control, hemiplegia and decreased balance strategies and therefore will continue to benefit from skilled PT intervention to increase  functional independence with mobility.  Patient progressing toward long term goals..  Continue plan of care.  PT Short Term Goals Week 3:  PT Short Term Goal 1 (Week 3): Patient will perform bed mobility with mod A consistently with use of bed rails. PT Short Term Goal 1 - Progress (Week 3): Progressing toward goal PT Short Term Goal 2 (Week 3): Patient will perform basic transfers with mod A. PT Short Term Goal 2 - Progress (Week 3): Progressing toward goal PT Short Term Goal 3 (Week 3): Patient will initiate car transfer training. PT Short Term Goal 3 - Progress (Week 3): Not progressing(Plan to d/c to SNF) PT Short Term Goal 4 (Week 3): Patient will ambulate >10 ft with max A using functional AD (not rail). PT Short Term Goal 4 - Progress (Week 3): Progressing toward goal Week 4:  PT Short Term Goal 1 (Week 4): STG=LTG due to ELOS  Skilled Therapeutic Interventions/Progress Updates:     Patient in bed asleep with his daughter at bedside upon PT arrival. Patient easily aroused to verbal stimulation and agreeable to PT session. Patient's daughter reported patient was grimacing and rubbing his head proir to falling asleep. Patient reported that he had a headache once aroused. Vitals: BP 158/63, HR 51 (irregular rhythm), SPO2 96%. RN made aware and reassessed vitals and listened to heart and lung sounds. HR 60 BPM with regular rhythm, BP 149/ 66. Adjusted HOB to 80 degrees and patient became more alert and drank several sips on nectar this tea with cues for small sips. Patient reported that he did not sleep well last night and that he was very tired. Also reported that he moved his R LE on his own this morning. Focused session on  bed level LE NMR due to patient's increased fatigue and elevated BP.   Neuromuscular Re-ed: Patient performed the following R LE motor control activities: - hip/knee flexion/extension rhythmic initiation progressing to AAROM (25-50% patient activation) 2x12 -bridging  with manual facilitation and approximation through ankle and knee joints for overflow during B LE activation 2x5 (minimal R LE activation) -B hip abduction/adduction progressing form PROM on R to AROM on R through limited range x20 (patient very excited about independent movement) -DF/PF PROM/AAROM with minimal muscle activation  Retrieved 20"x18" manual w/c to initiate w/c mobility next session.   Patient in bed with his daughter in the room with R PRAFO donned and R UE elevated at end of session with breaks locked, bed alarm set, and all needs within reach.    Therapy Documentation Precautions:  Precautions Precautions: Fall Precaution Comments: R hemi; R inattention Restrictions Weight Bearing Restrictions: No   Therapy/Group: Individual Therapy  [name] PT, DPT  10/01/2019, 12:40 PM

## 2019-10-01 NOTE — Progress Notes (Signed)
Occupational Therapy Session Note  Patient Details  Name: David [name] MRN: [id number] Date of Birth: 06/16/1923  Today's Date: 10/01/2019  Session 1 OT Individual Time: 1000-1028 OT Individual Time Calculation (min): 28 min   Session 2 OT Individual Time: 1402-1500 OT Individual Time Calculation (min): 58 min   Short Term Goals: Week 4:  OT Short Term Goal 1 (Week 4): LTG=STG 2/2 ELOS  Skilled Therapeutic Interventions/Progress Updates:    Session 1 Pt greeted semi-reclined in bed awake, but a little more lethargic this morning. Pt noted to be incontinent of urine. Worked on rolling L and R with Mod A to the R and max A to the L. OT provided total A for peri-care and brief change.  Pt needed total A to thread pant legs, then worked on bridging hips to pull up pants. OT able to break LLE tone to bend knee, but he had a lot of tone. OT kept pt in sidelying to work on [place] in gravity eliminated position. OT provided joint input through wrist and elbow, but limited activation noted. OT placed SAEBO e-stim on wrist extensors. SAEBO left on for 60 minutes. OT returned to remove SAEBO with skin intact and no adverse reactions.  [place]o Stim One 330 pulse width 35 Hz pulse rate On 8 sec/ off 8 sec Ramp up/ down 2 sec Symmetrical Biphasic wave form  Max intensity 159m at 500 Ohm load  Session 2 Pt greeted semi-reclined in bed. Per daughters, pt seems to want to use the bathroom, but he states he can't go. [facility] completed bed mobility with max A. Educated daughters on use of Stedy and discussed possible options for home use of Stedy if they decide to take him home. [facility], although he only needed min A to power into standing and assistance to manage R UE. Pt needed min A to maintain sitting balance while perched on stedy seat and [facility] Pt unable to have BM, but did pass some gas. Educated daughters on positioning  for peri-care using Stedy and lateral leans. Pt was able to power himself up enough for OT to cleanse buttocks. Pt's daughters then assisted pt into standing, but he needed max A to power up this time. Educated on ways to cue pt into upright sitting perched in [place] He was able to correct lateral lean with max cues. Daughters then assisted with moving Stedy to EOB. Pt brought into supine and was able to manage L side of body. Pt left semi-reclined in bed with call bell in reach and needs met.  Therapy Documentation Precautions:  Precautions Precautions: Fall Precaution Comments: R hemi; R inattention Restrictions Weight Bearing Restrictions: No Pain:  denies pain   Therapy/Group: Individual Therapy  EValma Cava5/18/2021, 10:53 AM

## 2019-10-02 ENCOUNTER — Inpatient Hospital Stay (HOSPITAL_COMMUNITY): Payer: Medicare Other

## 2019-10-02 ENCOUNTER — Inpatient Hospital Stay (HOSPITAL_COMMUNITY): Payer: Medicare Other | Admitting: Physical Therapy

## 2019-10-02 ENCOUNTER — Encounter (HOSPITAL_COMMUNITY): Payer: Medicare Other | Admitting: Psychology

## 2019-10-02 LAB — GLUCOSE, CAPILLARY
Glucose-Capillary: 96 mg/dL (ref 70–99)
Glucose-Capillary: 118 mg/dL — ABNORMAL HIGH (ref 70–99)
Glucose-Capillary: 105 mg/dL — ABNORMAL HIGH (ref 70–99)
Glucose-Capillary: 140 mg/dL — ABNORMAL HIGH (ref 70–99)

## 2019-10-02 MED ORDER — FLEET ENEMA 7-19 GM/118ML RE ENEM
1.0000 | ENEMA | Freq: Once | RECTAL | Status: AC
  Administered 2019-10-02: 1 via RECTAL
  Filled 2019-10-02: qty 1

## 2019-10-02 NOTE — Progress Notes (Signed)
Pt had moderate formed result after fleet enema this evening.

## 2019-10-02 NOTE — Progress Notes (Signed)
Patient ID: David [name], male   DOB: 1924/03/27, 86 y.o.   MRN: [id number]  Referral sent out to daughter preferences: [facility], [employer] and [employer]

## 2019-10-02 NOTE — Progress Notes (Signed)
Nutrition Follow-up  RD working remotely.  DOCUMENTATION CODES:   Obesity unspecified  INTERVENTION:   - Snacks TID between meals  -MagicCup TID with meals, each supplement provides 290 kcal and 9 grams of protein  - Encourage adequate PO intake and provide feeding assistance  - Vital Cuisine Shake TID, each supplement provides 520 kcal and 22 grams of protein  NUTRITION DIAGNOSIS:   Inadequate oral intake related to dysphagia, lethargy/confusion as evidenced by meal completion < 25%.  Progressing  GOAL:   Patient will meet greater than or equal to 90% of their needs  Progressing  MONITOR:   Supplement acceptance, Diet advancement, Labs, PO intake, Weight trends  REASON FOR ASSESSMENT:   Malnutrition Screening Tool    ASSESSMENT:   86 year old male with PMH of CAD, COPD, LBP, tremors, AVR. Pt was admitted on 09/04/19 with right facial weakness, dysarthria, right hemiparesis, and left gaze preference. CT head done showing acute left ICH. 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 Pt with resultant dysphagia, aphasia, delayed processing. Pt admitted to [facility] on 4/26.  5/18 - MBS, diet upgraded to dysphagia 1 with nectar-thick liquids  Noted target d/c date of 10/08/19.  Now that liquids have been upgraded from honey-thick to nectar-thick, RD will order Hormel Shakes TID with meals.  Pt experiencing constipation. Fleet enema ordered as pt has had success with this in the past per RN.  Weight continues to trend down overall. Question accuracy of weight drop from 230 lbs to 192 lbs in 1 week (5/10 to 5/17). RD has ordered all oral nutrition supplements that align with pt's current diet order. Snacks have also been ordered TID between meals.  Meal Completion: 30-75%  Medications reviewed and include: SSI, Ritalin, protonix, senna  Labs reviewed.  CBG's: 96-118 x 24 hours  Diet Order:   Diet Order            DIET - DYS 1 Room service appropriate? Yes; Fluid consistency: Nectar Thick  Diet effective now              EDUCATION NEEDS:   No education needs have been identified at this time  Skin:  Skin Assessment: Reviewed RN Assessment  Last BM:  09/28/19  Height:   Ht Readings from Last 1 Encounters:  09/23/19 5\' 11"  (1.803 m)    Weight:   Wt Readings from Last 1 Encounters:  10/01/19 85.3 kg    Ideal Body Weight:  78.2 kg  BMI:  Body mass index is 26.23 kg/m.  Estimated Nutritional Needs:   Kcal:  1800-2000  Protein:  95-110 grams  Fluid:  1.8-2.0 L    Gaynell Face, MS, RD, LDN Inpatient Clinical Dietitian Pager: [phone] Weekend/After Hours: [phone]

## 2019-10-02 NOTE — Progress Notes (Signed)
[facility] PHYSICAL MEDICINE & REHABILITATION PROGRESS NOTE  Subjective/Complaints: Last BM on 5/15. No BM last night despite soapsud enema. Discussed with nurse [place]- last time he had benefit with fleet enema- will try again today. Daughter reports that he does appear uncomfortable at times due to constipation  ROS: Limited due to cognitive/behavioral    Objective: DG Swallowing Func-Speech Pathology  Result Date: 10/01/2019 Objective Swallowing Evaluation: Type of Study: MBS-Modified Barium Swallow Study  Patient Details Name: David [name] MRN: [id number] Date of Birth: 05-17-1923 Today's Date: 10/01/2019 Time: SLP Start Time (ACUTE ONLY): 0900 -SLP Stop Time (ACUTE ONLY): 0930 SLP Time Calculation (min) (ACUTE ONLY): 30 min Past Medical History: Past Medical History: Diagnosis Date . CAD (coronary artery disease)   intolerance of statins . COPD (chronic obstructive pulmonary disease) ([place])  . Current use of long term anticoagulation  . GERD (gastroesophageal reflux disease)  . Hyperlipidemia  . Hypertension  . Impaired glucose tolerance 02/14/2011 . LBP (low back pain)  . OA (osteoarthritis)  . S/P AVR (aortic valve replacement) 2009  bonine valve Past Surgical History: Past Surgical History: Procedure Laterality Date . AORTIC VALVE REPLACEMENT  01/15/1999  bovine . CARDIAC CATHETERIZATION  10/05/1998  Recommend CABG revascularizaition . CARDIAC CATHETERIZATION  06/11/1999  Continue medical therapy . CARDIAC CATHETERIZATION  01/08/2007  Patent grafts, recommend medical therapy . CARDIOVASCULAR STRESS TEST  04/22/2010  Small lateral defect which is partially reversible, no ischemic EKG changes were noted. Marland Kitchen CAROTID DOPPLER  04/22/2010  Bilateral ICAs-no evidence of diametere reduction, significant tortuosity, or any other vascular abnormality. . CORONARY ARTERY BYPASS GRAFT  10/09/1998  LIMA to LAD, SVG to diag, SVG sequential to 2 marginals, SVG sequential to PDA & PLA. CE#25 porcine AVR. Marland Kitchen TRANSTHORACIC  ECHOCARDIOGRAM  05/14/2012  EF 50-55%, bioprosthesis of the aortic valve with well preserved LV function, moderate concentric [place] HPI: See H&P  Subjective: alert Assessment / Plan / Recommendation CHL IP CLINICAL IMPRESSIONS 10/01/2019 Clinical Impression Patient presents with a moderate oropharyngeal dysphagia characterized by intermittent premature loss of bolus into the pharynx with delayed and inconsistent onset of pharyngeal swallow.  Inconsistent timing of his swallow resulted in flash penetration with nectar-thick liquids and intermittent silent aspiration of thin liquids. A cued cough/throat clear was effective in clearing the majority of the aspirates.  Moderate oral reside was noted with solid textures that required a liquid wash to clear.  Recommend patient upgrade to nectar-thick liquids and initiate the water protocol with focus on quick, single sips of thin liquids.  Educated daughter regarding results, she verbalized understanding. SLP Visit Diagnosis Dysphagia, oropharyngeal phase (R13.12) Attention and concentration deficit following -- Frontal lobe and executive function deficit following -- Impact on safety and function Mild aspiration risk;Moderate aspiration risk   CHL IP TREATMENT RECOMMENDATION 10/01/2019 Treatment Recommendations Therapy as outlined in treatment plan below   Prognosis 10/01/2019 Prognosis for Safe Diet Advancement Good Barriers to Reach Goals -- Barriers/Prognosis Comment -- CHL IP DIET RECOMMENDATION 10/01/2019 SLP Diet Recommendations Dysphagia 1 (Puree) solids;Nectar thick liquid;Free water protocol after oral care Liquid Administration via Cup;Straw Medication Administration Crushed with puree Compensations Minimize environmental distractions;Slow rate;Small sips/bites;Follow solids with liquid;Monitor for anterior loss Postural Changes --   CHL IP OTHER RECOMMENDATIONS 10/01/2019 Recommended Consults -- Oral Care Recommendations Oral care BID Other Recommendations Order  thickener from pharmacy;Prohibited food (jello, ice cream, thin soups);Clarify dietary restrictions;Remove water pitcher;Have oral suction available   CHL IP FOLLOW UP RECOMMENDATIONS 10/01/2019 Follow up Recommendations Inpatient Rehab  CHL IP FREQUENCY AND DURATION 10/01/2019 Speech Therapy Frequency (ACUTE ONLY) min 3x week Treatment Duration 2 weeks      CHL IP ORAL PHASE 10/01/2019 Oral Phase Impaired Oral - Pudding Teaspoon -- Oral - Pudding Cup -- Oral - Honey Teaspoon NT Oral - Honey Cup NT Oral - Nectar Teaspoon NT Oral - Nectar Cup Delayed oral transit;Right pocketing in lateral sulci;Premature spillage;Decreased bolus cohesion Oral - Nectar Straw Delayed oral transit;Decreased bolus cohesion;Premature spillage Oral - Thin Teaspoon NT Oral - Thin Cup -- Oral - Thin Straw Decreased bolus cohesion;Premature spillage;Delayed oral transit;Right anterior bolus loss Oral - Puree Right anterior bolus loss Oral - Mech Soft Impaired mastication;Lingual/palatal residue Oral - Regular -- Oral - Multi-Consistency -- Oral - Pill -- Oral Phase - Comment --  CHL IP PHARYNGEAL PHASE 10/01/2019 Pharyngeal Phase Impaired Pharyngeal- Pudding Teaspoon -- Pharyngeal -- Pharyngeal- Pudding Cup -- Pharyngeal -- Pharyngeal- Honey Teaspoon NT Pharyngeal -- Pharyngeal- Honey Cup NT Pharyngeal -- Pharyngeal- Nectar Teaspoon NT Pharyngeal -- Pharyngeal- Nectar Cup Delayed swallow initiation-vallecula;Penetration/Aspiration during swallow Pharyngeal Material enters airway, remains ABOVE vocal cords then ejected out Pharyngeal- Nectar Straw Delayed swallow initiation-vallecula;Penetration/Aspiration during swallow Pharyngeal Material enters airway, remains ABOVE vocal cords then ejected out Pharyngeal- Thin Teaspoon NT Pharyngeal -- Pharyngeal- Thin Cup Delayed swallow initiation-pyriform sinuses;Penetration/Aspiration during swallow Pharyngeal Material enters airway, passes BELOW cords without attempt by patient to eject out (silent  aspiration);Material enters airway, CONTACTS cords and then ejected out;Material enters airway, remains ABOVE vocal cords and not ejected out;Material does not enter airway Pharyngeal- Thin Straw Penetration/Aspiration during swallow;Delayed swallow initiation-pyriform sinuses Pharyngeal Material enters airway, CONTACTS cords and not ejected out Pharyngeal- Puree WFL Pharyngeal -- Pharyngeal- Mechanical Soft -- Pharyngeal -- Pharyngeal- Regular -- Pharyngeal -- Pharyngeal- Multi-consistency -- Pharyngeal -- Pharyngeal- Pill -- Pharyngeal -- Pharyngeal Comment --  No flowsheet data found. PAYNE, COURTNEY 10/01/2019, 9:58 AM    Weston Anna, MA, CCC-SLP [phone]           Recent Labs    09/30/19 0502  WBC 6.6  HGB 15.0  HCT 45.2  PLT 97*   Recent Labs    09/30/19 0502  NA 139  K 4.0  CL 106  CO2 25  GLUCOSE 110*  BUN 9  CREATININE 1.25*  CALCIUM 8.8*    Intake/Output Summary (Last 24 hours) at 10/02/2019 1156 Last data filed at 10/01/2019 1830 Gross per 24 hour  Intake 240 ml  Output --  Net 240 ml     Physical Exam: Vital Signs Blood pressure 136/72, pulse (!) 58, temperature 97.9 F (36.6 C), temperature source Oral, resp. rate 18, height 5\' 11"  (1.803 m), weight 85.3 kg, SpO2 98 %. Constitutional: No distress . Vital signs reviewed. HEENT: EOMI, oral membranes moist Neck: supple Cardiovascular: RRR without murmur. No JVD    Respiratory/Chest: CTA Bilaterally without wheezes or rales. Normal effort    GI/Abdomen: BS +, non-tender, non-distended Ext: no clubbing, cyanosis, or edema Psych: pleasant, more engaging and positive Musc: No edema in extremities.  No tenderness in extremities. Neurological:   Speech remains much more clear. conversationally more appropriate.  Flexor tone RUE, extensor tone RLE still present. --> MAS 1 to 1+/4  Skin: numerous bruises/lacs on arms all stable.  Small area redness near sacrum/buttocks improving  Assessment/Plan: 1. Functional  deficits secondary to left BG ICH which require 3+ hours per day of interdisciplinary therapy in a comprehensive inpatient rehab setting.  Physiatrist is providing close team supervision and 24 hour management  of active medical problems listed below.  Physiatrist and rehab team continue to assess barriers to discharge/monitor patient progress toward functional and medical goals  Care Tool:  Bathing        Body parts bathed by helper: Front perineal area, Buttocks     Bathing assist Assist Level: 2 Helpers     Upper Body Dressing/Undressing Upper body dressing Upper body dressing/undressing activity did not occur (including orthotics): N/A What is the patient wearing?: Hospital gown only    Upper body assist Assist Level: Dependent - Patient 0%    Lower Body Dressing/Undressing Lower body dressing      What is the patient wearing?: Incontinence brief, Pants     Lower body assist Assist for lower body dressing: Dependent - Patient 0%     Toileting Toileting    Toileting assist Assist for toileting: 2 Helpers     Transfers Chair/bed transfer  Transfers assist  Chair/bed transfer activity did not occur: Safety/medical concerns  Chair/bed transfer assist level: 2 Helpers Chair/bed transfer assistive device: Armrests   Locomotion Ambulation   Ambulation assist   Ambulation activity did not occur: Safety/medical concerns  Assist level: Maximal Assistance - Patient 25 - 49% Assistive device: Other (comment)(L rail) Max distance: 15'   Walk 10 feet activity   Assist  Walk 10 feet activity did not occur: Safety/medical concerns  Assist level: Maximal Assistance - Patient 25 - 49% Assistive device: Other (comment)(L rail)   Walk 50 feet activity   Assist Walk 50 feet with 2 turns activity did not occur: Safety/medical concerns         Walk 150 feet activity   Assist Walk 150 feet activity did not occur: Safety/medical concerns         Walk  10 feet on uneven surface  activity   Assist Walk 10 feet on uneven surfaces activity did not occur: Safety/medical concerns         Wheelchair     Assist Will patient use wheelchair at discharge?: Yes Type of Wheelchair: Manual Wheelchair activity did not occur: Safety/medical concerns(lethargy; TIS would be dependent)         Wheelchair 50 feet with 2 turns activity    Assist            Wheelchair 150 feet activity     Assist          Blood pressure 136/72, pulse (!) 58, temperature 97.9 F (36.6 C), temperature source Oral, resp. rate 18, height 5\' 11"  (1.803 m), weight 85.3 kg, SpO2 98 %.  Medical Problem List and Plan: 1.Functional, mobility and cognitive deficitssecondary to left basal ganglia hemorrhage  -Continue CIR PT, OT, SLP   -therapy intensity increased back to 3hr per day  -spastic right hemiparesis: Not a candidate d/t lethargy for oral antispasmodics. ?botox as outpt   -PRAFO/AFO, ROM with therapy/family       -arousal still can wax/wane: recent CT of head was stable to improved in appearance.  2. Antithrombotics: -DVT/anticoagulation:Mechanical:Sequential compression devices, below kneeBilateral lower extremities--platelets stable to increased 90k -antiplatelet therapy: N/A 3.Chronic LBP/Pain Management:Tylenol prn.well controlled 4. Mood:LCSW to follow for evaluation and support.  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 Neuropsych: This patientis notcapable of making decisions on hisown behalf.  -  Amantadine increased to 100mg  bid    -ritalin started 5/11 at 2.5mg , increase to 5mg  bid 5/13. Bp/hr ok   -continue! 6. Skin/Wound Care:Routine pressure relief measures. 7. Fluids/Electrolytes/Nutrition:resumed po  diet   5/11 bmet reviewed from 5/10 and stable. Changed IVF to HS  5/17 labs all stable to improved!  5/18 given fluid upgrade  will see how he does off hs IVF 8. HTN:  SBP goal<140--added prn catapres  -resumed low dose tenormin 5/5--> DBP still elevated. Increased to 25mg   Controlled on 5/19 at 136/72  9.H/oCOPD:Stable without meds. 10. Impaired glucose tolerance:CBG's wnl so far.  - SSI as indicated.  5/16 controlled 11. CAD/AVR:Off ASA due to bleed. No statin due to intolerance. Tenormin, cozaar and lasix (used prn) on hold.  -no TEE indicated 12. Thrombocytopenia:   Platelets decreased to 122 on 5/11.  - drop to 97k 5/17--has been as low as 73k during this admit 13. AKI: Creatinine 1.11 on 5/11, continue to monitor weekly.  14.  Post stroke dysphagia:               -aspiration precautions  -had MBS today, still pharyngeal dysphagia.    -diet changed to D1/nectars 15. Constipation: Senna at bedtime  - no bm since 5/14  5/18---sorbitol 60cc today, SSE if needed  5/19: No benefit from soap suds enema yesterday. Will try fleet enema today since nurse says he has had success with this in the past.   LOS: 23 days A FACE TO FACE EVALUATION WAS [place] 10/02/2019, 11:56 AM

## 2019-10-02 NOTE — Consult Note (Signed)
Neuropsychological Consultation   Patient:   David [name]   DOB:   Oct 21, 1923  MR Number:  [id number]  Location:  [facility] [place] [id number] [place] [address] Dept: [facility]: [phone]           Date of Service:   10/02/2019  Start Time:   2 PM End Time:   3 PM  Provider/Observer:  Ilean Skill, Psy.D.       Clinical Neuropsychologist       Billing Code/Service: [id number]  Chief Complaint:    David [name] is a 86 year old male with a history of CAD, COPD, LBP, tremors, AVR who was admitted on 09/04/2019 with right facial weakness, dysarthria, right hemiparesis and left gaze preference.  CT head done showing left ICH subcortical.  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  Bleed was felt to be due to small vessel disease versus thrombocytopenia as platelets was 93 at admission.  The patient has continued to have significant right-sided motor deficits, difficulty with expressive language primarily due to motor deficits and some degree of confusion and cognitive impairments.  Reason for Service:  The patient was referred for neuropsychological consultation due to adjustment issues and coping and the patient making statements indications that he was questioning his reason for living going forward.  Below is the HPI for the current mission.  [id number] David [name] is a 86 year old male with history of CAD, COPD, LBP, tremors, AVR who was admitted on 04/21/21with rightfacial weakness, dysarthria, right hemiparesis, and left gaze preference. Daughter had left for MD appointment and when she returned an hour later she was lying on the floor between beds. EMS was activated and CT head done showing acute left ICH. CTA head/neck was negative for vascular malformation and left  basal ganglia was negative for spot sign. 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 Bleed was felt to be due to small vessel disease versus thrombocytopenia as platelets with 93 at admission.  2D echo was done showing EF of 60 to 65% with mild LVH and bioprosthetic aortic valve as well as 1.1 cm with vegetation attached to the RV side of pulmonic valve. 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 Claiborne Billings. Is patient without fevers or other clinical indication of SBE TEE was not felt to be indicated. MBS was done on 423 showing moderate dysphagia with delayed swallow and trace aspiration of thins--he was placed on dysphagia 1, honey liquids by spoon. Patient with resultant dysphagia, aphasia, delayed processing with difficulty following one-step commands BLE weakness with visual impairments affecting ADLs and mobility. CIR was recommended due to functional decline.  Current Status:  Today was a follow-up assessment.  Patient with improved expressive language including fluency and accuracy.  Still slowed response times, but patient was with improved mental status but slowed information processing and circumstantial focus.     Behavioral Observation: David [name]  presents as a 86 y.o.-year-old Right Caucasian Male who appeared his stated age. his dress was Appropriate and he was Well Groomed and his manners were Appropriate to the situation.  his participation was indicative of Appropriate and Redirectable behaviors.  There were any  physical disabilities noted.  he displayed an appropriate level of cooperation and motivation.     Interactions:    Active Appropriate and Inattentive  Attention:   abnormal and attention span appeared shorter  than expected for age  Memory:   abnormal; global memory impairment noted  Visuo-spatial:  not examined  Speech (Volume):  low  Speech:   slurred; garbled  Thought Process:  Circumstantial  Though Content:  WNL; not suicidal and not homicidal  Orientation:   person and place  Judgment:   Poor  Planning:   Poor  Affect:    Blunted, Flat and Lethargic  Mood:    Dysphoric  Insight:   Shallow  Intelligence:   normal  Medical History:   Past Medical History:  Diagnosis Date  . CAD (coronary artery disease)    intolerance of statins  . COPD (chronic obstructive pulmonary disease) ([place])   . Current use of long term anticoagulation   . GERD (gastroesophageal reflux disease)   . Hyperlipidemia   . Hypertension   . Impaired glucose tolerance 02/14/2011  . LBP (low back pain)   . OA (osteoarthritis)   . 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     Psychiatric History:  No prior psychiatric history and given his age and degree of small vessel disease the patient does appear to have been doing quite well relatively speaking.  Family Med/Psych History:  Family History  Problem Relation Age of Onset  . Coronary artery disease Other   . Hypertension Other     Risk of Suicide/Violence: low the patient denies any impulses or plans to purposely harm himself but clearly is questioning whether or not he wants to live with continued deficits and dependency upon others.  Impression/DX:  David [name] is a 86 year old male with a history of CAD, COPD, LBP, tremors, AVR who was admitted on 09/04/2019 with right facial weakness, dysarthria, right hemiparesis and left gaze preference.  CT head done showing left ICH subcortical.  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  Bleed was felt to be due to small vessel disease versus thrombocytopenia as platelets  was 93 at admission.  The patient has continued to have significant right-sided motor deficits, difficulty with expressive language primarily due to motor deficits and some degree of confusion and cognitive impairments.  oday was a follow-up assessment.  Patient with improved expressive language including fluency and accuracy.  Still slowed response times, but patient was with improved mental status but slowed information processing and circumstantial focus.   Disposition/Plan:  Patient with some improvement in right leg motor function, clarity of speech, and mild improvement in orientation.    Diagnosis:    Subcortical left intracranial hemorrhage primarily affecting basal ganglia area       Electronically Signed   _______________________ Ilean Skill, Psy.D.

## 2019-10-02 NOTE — Progress Notes (Signed)
Occupational Therapy Session Note  Patient Details  Name: David [name] MRN: [id number] Date of Birth: 1923-10-27  Today's Date: 10/02/2019 OT Individual Time: 1515-1600 OT Individual Time Calculation (min): 45 min    Short Term Goals: Week 1:  OT Short Term Goal 1 (Week 1): Pt will perform grooming with min A overall. OT Short Term Goal 1 - Progress (Week 1): Met OT Short Term Goal 2 (Week 1): Pt will maintain sitting balance during self care tasks with max A overall. OT Short Term Goal 2 - Progress (Week 1): Met OT Short Term Goal 3 (Week 1): Pt will perform UB dressing with max A. OT Short Term Goal 3 - Progress (Week 1): Met OT Short Term Goal 4 (Week 1): Pt will transfer with assist of 1 person. OT Short Term Goal 4 - Progress (Week 1): Progressing toward goal  Skilled Therapeutic Interventions/Progress Updates:    1:1. Pt received in TIS with daughter present. Pt reporting dizziness and OT check vitals 143/78 HR 71 and O2 96%. Pt reporting better symptoms after 5 min. Pt completes dynavision 3x1 min trials in all 4 quadrants seated, perched on stedy flaps and standing in stedy. Pt WB through RUE on stedy bar in perch and standing via facilitation by OT. Pt demo improved weight shifting L standing reaching for stimulus in stedy and able ot hold stance. OT provides gentle PROM of RUE in between dynavision bouts with emphasis on ext rotation, elbow ext, and supination. Exited sesison with pt seated in bed, exit alarm on and call light in reach   Therapy Documentation Precautions:  Precautions Precautions: Fall Precaution Comments: R hemi; R inattention Restrictions Weight Bearing Restrictions: No General:   Vital Signs: Therapy Vitals Temp: 97.8 F (36.6 C) Temp Source: Oral Pulse Rate: (!) 40 Resp: 17 BP: 138/65 Patient Position (if appropriate): Sitting Oxygen Therapy SpO2: 99 % O2 Device: Room Air Pain:   ADL:   Vision   Perception    Praxis   Exercises:    Other Treatments:     Therapy/Group: Individual Therapy  Tonny Branch 10/02/2019, 4:13 PM

## 2019-10-02 NOTE — NC FL2 (Signed)
[place] LEVEL OF CARE SCREENING TOOL     IDENTIFICATION  Patient Name: David [name] Birthdate: 1923-06-23 Sex: male Admission Date (Current Location): 09/09/2019  [facility] and [place] Number:  Herbalist and Address:  [facility] [place]. [facility], [place] [address] [place], [place] [address]      Provider Number: [id number]  Attending Physician Name and Address:  Meredith Staggers, MD  Relative Name and Phone Number:  [name]    Current Level of Care: Hospital Recommended Level of Care: [place] Prior Approval Number:    Date Approved/Denied:   PASRR Number:    Discharge Plan: SNF    Current Diagnoses: Patient Active Problem List   Diagnosis Date Noted  . Cognitive and neurobehavioral dysfunction   . Labile blood pressure   . Dysphagia, post-stroke   . AKI (acute kidney injury) ([place])   . Labile blood glucose   . Coronary artery disease involving native coronary artery of native heart without angina pectoris   . Chronic obstructive pulmonary disease ([place])   . Tremors of nervous system   . S/P AVR   . Thrombocytopenia ([place])   . Global aphasia   . ICH (intracerebral hemorrhage) ([place]) 09/04/2019  . Tremor 05/20/2019  . 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 Chest pain, atypical 09/20/2018  . Onychomycosis 06/06/2018  . Acute pain of left knee 08/18/2017  . Wart 06/05/2017  . Acute upper respiratory infection 05/12/2016  . Skin irritation from shaving 12/02/2015  . Benign paroxysmal positional vertigo 03/09/2015  . Plantar fasciitis, bilateral 12/02/2014  . Constipation 07/17/2014  . Cough 04/09/2014  . S/P AVR (aortic valve replacement) 11/04/2013  . Elevated hemoglobin ([place]) 12/14/2012  . Well adult exam 11/12/2012  . 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 Impaired glucose tolerance 02/14/2011  . Edema 10/22/2010  . Actinic keratoses 10/22/2010  . THROMBOCYTOPENIA 01/22/2010  . MELANOMA 03/05/2009  .  TOBACCO USE, QUIT 02/27/2009  . Neoplasm of uncertain behavior of skin 05/08/2007  . Dyslipidemia 02/08/2007  . Essential hypertension 02/08/2007  . Coronary atherosclerosis 02/08/2007  . COPD 02/08/2007  . GERD 02/08/2007  . OSTEOARTHRITIS 02/08/2007  . LOW BACK PAIN 02/08/2007    Orientation RESPIRATION BLADDER Height & Weight     Self, Time, Situation, Place  Normal Continent Weight: 188 lb 0.8 oz (85.3 kg) Height:  5\' 11"  (180.3 cm)  BEHAVIORAL SYMPTOMS/MOOD NEUROLOGICAL BOWEL NUTRITION STATUS      Continent Diet  AMBULATORY STATUS COMMUNICATION OF NEEDS Skin   Extensive Assist Verbally Normal                       Personal Care Assistance Level of Assistance  Bathing, Feeding, Dressing Bathing Assistance: Maximum assistance Feeding assistance: Limited assistance Dressing Assistance: Maximum assistance     Functional Limitations Info             SPECIAL CARE FACTORS FREQUENCY  PT (By licensed PT), OT (By licensed OT), Speech therapy     PT Frequency: 5x a week OT Frequency: 5 x week     Speech Therapy Frequency: 3-5x a week      Contractures Contractures Info: Not present    Additional Factors Info                  Current Medications (10/02/2019):  This is the current hospital active medication list Current Facility-Administered Medications  Medication Dose Route Frequency Provider Last Rate Last Admin  .  acetaminophen (TYLENOL) tablet 325-650 mg  325-650 mg Oral Q4H PRN Bary Leriche, PA-C   650 mg at 10/01/19 2102  . alum & mag hydroxide-simeth (MAALOX/MYLANTA) 200-200-20 MG/5ML suspension 30 mL  30 mL Oral Q4H PRN Love, Pamela S, PA-C      . amantadine (SYMMETREL) 50 MG/5ML solution 100 mg  100 mg Oral BID Meredith Staggers, MD   100 mg at 10/02/19 0734  . atenolol (TENORMIN) tablet 25 mg  25 mg Oral Daily Meredith Staggers, MD   25 mg at 10/02/19 0734  . bisacodyl (DULCOLAX) suppository 10 mg  10 mg Rectal Daily PRN Bary Leriche, PA-C    10 mg at 09/26/19 [id number]  . cloNIDine (CATAPRES) tablet 0.1 mg  0.1 mg Oral Q6H PRN Bary Leriche, PA-C   0.1 mg at 10/01/19 1124  . diphenhydrAMINE (BENADRYL) 12.5 MG/5ML elixir 12.5-25 mg  12.5-25 mg Oral Q6H PRN Love, Pamela S, PA-C      . escitalopram (LEXAPRO) tablet 5 mg  5 mg Oral QHS Meredith Staggers, MD   5 mg at 10/01/19 2103  . ezetimibe (ZETIA) tablet 10 mg  10 mg Oral Daily Bary Leriche, PA-C   10 mg at 10/02/19 0734  . guaiFENesin-dextromethorphan (ROBITUSSIN DM) 100-10 MG/5ML syrup 5-10 mL  5-10 mL Oral Q6H PRN Love, Pamela S, PA-C      . insulin aspart (novoLOG) injection 0-5 Units  0-5 Units Subcutaneous QHS Love, Pamela S, PA-C      . insulin aspart (novoLOG) injection 0-9 Units  0-9 Units Subcutaneous TID WC Love, Ivan Anchors, PA-C   1 Units at 10/01/19 1254  . lactulose (CHRONULAC) 10 GM/15ML solution 20 g  20 g Oral BID PRN Bary Leriche, PA-C   20 g at 09/24/19 0528  . MEDLINE mouth rinse  15 mL Mouth Rinse q12n4p Bary Leriche, PA-C   15 mL at 10/01/19 1926  . methylphenidate (RITALIN) tablet 5 mg  5 mg Oral BID WC Meredith Staggers, MD   5 mg at 10/02/19 0602  . pantoprazole sodium (PROTONIX) 40 mg/20 mL oral suspension 40 mg  40 mg Oral QHS Love, Pamela S, PA-C   40 mg at 10/01/19 2103  . prochlorperazine (COMPAZINE) tablet 5-10 mg  5-10 mg Oral Q6H PRN Love, Pamela S, PA-C       Or  . prochlorperazine (COMPAZINE) injection 5-10 mg  5-10 mg Intramuscular Q6H PRN Love, Pamela S, PA-C       Or  . prochlorperazine (COMPAZINE) suppository 12.5 mg  12.5 mg Rectal Q6H PRN Love, Pamela S, PA-C      . senna-docusate (Senokot-S) tablet 2 tablet  2 tablet Oral BID Meredith Staggers, MD   2 tablet at 10/02/19 0734  . sorbitol 70 % solution 30 mL  30 mL Oral Daily PRN Alger Simons T, MD      . sorbitol 70 % solution 60 mL  60 mL Oral NOW Meredith Staggers, MD      . traZODone (DESYREL) tablet 25-50 mg  25-50 mg Oral QHS PRN Bary Leriche, PA-C   50 mg at 09/26/19 2153  .  triamcinolone 0.1 % cream : eucerin cream, 1:1   Topical BID Bary Leriche, [place]   Given at 10/01/19 2103     Discharge Medications: Please see discharge summary for a list of discharge medications.  Relevant Imaging Results:  Relevant Lab Results:   Additional Information    Margreta Journey  Clydie Braun

## 2019-10-02 NOTE — Progress Notes (Signed)
Physical Therapy Session Note  Patient Details  Name: CAYCEN NODAL MRN: [id number] Date of Birth: 1923-06-07  Today's Date: 10/02/2019 PT Individual Time: 1010-1100 and 1300-1350 PT Individual Time Calculation (min): 50 min and 50 min  Short Term Goals: Week 4:  PT Short Term Goal 1 (Week 4): STG=LTG due to ELOS  Skilled Therapeutic Interventions/Progress Updates:     Session 1: Patient in bed with his daughter, [name], in the room upon PT arrival. Patient alert and agreeable to PT session. Patient denied pain during session and displayed no signs of pain throughout session.  Therapeutic Activity: Bed Mobility: Patient performed rolling R with CGA-close supervision and rolling L with mod A with use of bed rails in a flat bed while PT doffed soiled incontinence brief, performed peri-care, and donned a new incontinence brief and pants with max-total A for time management and energy conservation. Patient able to assist in bending R LE and push through R LE to roll L today. He performed supine to/from sit with mod A of 1 person and min A-CGA of a second due to posterior lean. Provided verbal cues for rolling L using R LE to push with manual facilitation and bringing R UE across his body, then pushing to sit up using his L UE, first pushing to his elbow then his hand. He performed scooting EOB with mod A x2 with cues for head hips relationship for reciprocal scooting. Patient doffed/donned a t-shirt with mod-max A, Provided cues to pull L UE out of sleeve, pull shirt over head then off R UE, then to pull his R arm through the sleeve of his new shirt, able to due this himself with hand-over-hand assist from his L UE with therapist holding the shirt, then threaded L UE through the other sleeve and pull the shirt overhead, required assist for guiding the shirt overhead and pulling the shirt down. Required min A-supervision for sitting balance while dressing. Transfers: Patient performed lateral scoot  transfers to the L bed>manual w/c>TIS w/c with mod A of 1 x1 and max A of 2 x1 due to unlevel transfer. Provided cues for hand placement, board placement, and head-hips relationship for proper technique and decreased assist with transfers.   Wheelchair Mobility:  Patient propelled wheelchair 25 feet with mod A for steering. Provided verbal cues for using L UE and LE for hemi-technique. Patient with 2 pillows behind his back for improved hamstring leverage, will need lower w/c for heel contact on floor to improved mobility and steering. Neuromuscular Re-ed: Patient performed the following sitting balance activities: -sitting balance for midline orientation due to posterior lean in sitting, provided multimodal cues and manual facilitation for forward weight shift, progressed from mod A-supervision for balance x4 min -R hip/knee flexion/extension x8 with AAROM progressing to AROM through partial range with verbal and tactile cues for muscle activation -R hip abd/add x8 with AAROM progressing AROM through partial range with verbal and tactile cues for muscle activation  -bridging x4 with manual facilitation for weight bearing and hip extension with therapist hands on patient's foot and knee applying anterior/inferior force  Patient in TIS w/c at end of session with breaks locked, bed alarm set, and all needs within reach. Patient's daughter participated in hands on training for dressing and bed mobility during session.   Session 2: Patient in TIS w/c with both of his daughters in the room upon PT arrival. Patient alert and agreeable to PT session. Patient denied pain during session and displayed no signs of  pain throughout session.  Therapeutic Activity: Transfers: Patient performed sit to/from stand x4 in the // bars with mod-min A and PT blocking R LE and providing manual facilitation and cues for hip and trunk extension. Performed sit to stand in [place] from TIS w/c with mod A to switch out seat  cushion from J cushion to Roho hybrid for patient comfort. Assessed inflation with smart check device and patient reported improved comfort with this cushion.  Neuromuscular Re-ed: Patient performed the following standing balance and R UE motor control activities: -standing balance 1.5-2 min x4 in front of a mirror for visual feedback focused on midline orientation, trunk, hip, and knee extension, and weight bearing through R UE and LE with manual facilitation and multi-modal cues, able to progress to weight shifts during last trial with patient pushing through R UE and LE to shift weight to the L -R elbow flexion/extension progressing from PROM to [facility] with multimodal cues for motor imagery and muscle activation 2x8 -R knee flexion/extension in sitting only PROM x8, patient unable to activate quad independently with multimodal cues and PNF tapping  Discussed d/c planning with patient's daughters. They would like the patient to stay longer prior to d/c to SNF. PT educated patient and family on his progress and motor recovery this week and will discuss options for prolonging stay with the Rehab team. Cautioned them that increased time at [place] may affect SNF coverage if this is their choice for d/c destination. Patient's daughters stated understanding and appreciated this discussion.  Patient in [place] w/c with his daughters in the room at end of session with breaks locked and all needs within reach.    Therapy Documentation Precautions:  Precautions Precautions: Fall Precaution Comments: R hemi; R inattention Restrictions Weight Bearing Restrictions: No    Therapy/Group: Individual Therapy  Kirin Brandenburger L Charrise Lardner PT, DPT  10/02/2019, 5:20 PM

## 2019-10-02 NOTE — Progress Notes (Addendum)
Physical Therapy Session Note  Patient Details  Name: David [name] MRN: [id number] Date of Birth: 1923-10-19  Today's Date: 10/02/2019 PT Individual Time: [id number] PT Individual Time Calculation (min): 55 min   Short Term Goals: Week 3:  PT Short Term Goal 1 (Week 3): Patient will perform bed mobility with mod A consistently with use of bed rails. PT Short Term Goal 1 - Progress (Week 3): Progressing toward goal PT Short Term Goal 2 (Week 3): Patient will perform basic transfers with mod A. PT Short Term Goal 2 - Progress (Week 3): Progressing toward goal PT Short Term Goal 3 (Week 3): Patient will initiate car transfer training. PT Short Term Goal 3 - Progress (Week 3): Not progressing(Plan to d/c to SNF) PT Short Term Goal 4 (Week 3): Patient will ambulate >10 ft with max A using functional AD (not rail). PT Short Term Goal 4 - Progress (Week 3): Progressing toward goal  Skilled Therapeutic Interventions/Progress Updates: Pt presents tilted back in TIS, w/ daughter present.  Pt agreeable to therapy.  Pt wheeled to gym for time conservation.  Pt performed seated reaching to peg board w/ WB on table at right elbow.  Pt reaching forward and to either side outside of BOS, occasional increased assist to maintain upright posture w/ extreme reach to right.  Pt performed lateral scoot w/c > mat table w/ total assist but pt given verbal and visual cues for left hand placement and initiates scoot.  Pt performed reaching forward and crossing midline "playing" Connect four, facilitation for WB and approximation of right shoulder w/ LEs on cushioned surface.  Pt performed lateral scoot to left mat table > TIS w/ max A to total, but pt improved clearace from surface to scoot into chair.  Pt returned to room and chair alarm applied and all needs in reach.  Daughter remains present w/ pt.     Therapy Documentation Precautions:  Precautions Precautions: Fall Precaution Comments: R hemi; R  inattention Restrictions Weight Bearing Restrictions: No General:   Vital Signs:  Pain:  no c/o pain, does not appear to have any pain.    Therapy/Group: Individual Therapy  Ladoris Gene 10/02/2019, 12:51 PM

## 2019-10-03 ENCOUNTER — Inpatient Hospital Stay (HOSPITAL_COMMUNITY): Payer: Medicare Other

## 2019-10-03 ENCOUNTER — Inpatient Hospital Stay (HOSPITAL_COMMUNITY): Payer: Medicare Other | Admitting: Occupational Therapy

## 2019-10-03 ENCOUNTER — Inpatient Hospital Stay (HOSPITAL_COMMUNITY): Payer: Medicare Other | Admitting: Speech Pathology

## 2019-10-03 LAB — GLUCOSE, CAPILLARY
Glucose-Capillary: 111 mg/dL — ABNORMAL HIGH (ref 70–99)
Glucose-Capillary: 145 mg/dL — ABNORMAL HIGH (ref 70–99)
Glucose-Capillary: 130 mg/dL — ABNORMAL HIGH (ref 70–99)
Glucose-Capillary: 125 mg/dL — ABNORMAL HIGH (ref 70–99)

## 2019-10-03 MED ORDER — ACETAMINOPHEN 325 MG PO TABS: 325.0000 mg | ORAL_TABLET | ORAL | Status: DC | PRN

## 2019-10-03 MED ORDER — LIVING WELL WITH DIABETES BOOK
Freq: Once | Status: DC
  Filled 2019-10-03: qty 1

## 2019-10-03 NOTE — NC FL2 (Signed)
[place] LEVEL OF CARE SCREENING TOOL     IDENTIFICATION  Patient Name: David [name] Birthdate: 24-Aug-1923 Sex: male Admission Date (Current Location): 09/09/2019  [facility] and [place] Number:  Herbalist and Address:  The [place]. [facility], [place] [address], [place], [place] [address]      Provider Number: [id number]  Attending Physician Name and Address:  Meredith Staggers, MD  Relative Name and Phone Number:  [name]    Current Level of Care: Hospital Recommended Level of Care: [place] Prior Approval Number:    Date Approved/Denied:   PASRR Number: [id number]  Discharge Plan: SNF    Current Diagnoses: Patient Active Problem List   Diagnosis Date Noted  . Cognitive and neurobehavioral dysfunction   . Labile blood pressure   . Dysphagia, post-stroke   . AKI (acute kidney injury) ([place])   . Labile blood glucose   . Coronary artery disease involving native coronary artery of native heart without angina pectoris   . Chronic obstructive pulmonary disease ([place])   . Tremors of nervous system   . S/P AVR   . Thrombocytopenia ([place])   . Global aphasia   . ICH (intracerebral hemorrhage) ([place]) 09/04/2019  . Tremor 05/20/2019  . 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 Chest pain, atypical 09/20/2018  . Onychomycosis 06/06/2018  . Acute pain of left knee 08/18/2017  . Wart 06/05/2017  . Acute upper respiratory infection 05/12/2016  . Skin irritation from shaving 12/02/2015  . Benign paroxysmal positional vertigo 03/09/2015  . Plantar fasciitis, bilateral 12/02/2014  . Constipation 07/17/2014  . Cough 04/09/2014  . S/P AVR (aortic valve replacement) 11/04/2013  . Elevated hemoglobin ([place]) 12/14/2012  . Well adult exam 11/12/2012  . 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 Impaired glucose tolerance 02/14/2011  . Edema 10/22/2010  . Actinic keratoses 10/22/2010  . THROMBOCYTOPENIA 01/22/2010   . MELANOMA 03/05/2009  . TOBACCO USE, QUIT 02/27/2009  . Neoplasm of uncertain behavior of skin 05/08/2007  . Dyslipidemia 02/08/2007  . Essential hypertension 02/08/2007  . Coronary atherosclerosis 02/08/2007  . COPD 02/08/2007  . GERD 02/08/2007  . OSTEOARTHRITIS 02/08/2007  . LOW BACK PAIN 02/08/2007    Orientation RESPIRATION BLADDER Height & Weight     Self, Time, Situation, Place  Normal Incontinent Weight: 188 lb 0.8 oz (85.3 kg) Height:  5\' 11"  (180.3 cm)  BEHAVIORAL SYMPTOMS/MOOD NEUROLOGICAL BOWEL NUTRITION STATUS      Incontinent Diet  AMBULATORY STATUS COMMUNICATION OF NEEDS Skin   Extensive Assist Verbally Normal                       Personal Care Assistance Level of Assistance  Bathing, Feeding, Dressing Bathing Assistance: Maximum assistance Feeding assistance: Maximum assistance Dressing Assistance: Maximum assistance     Functional Limitations Info             SPECIAL CARE FACTORS FREQUENCY  PT (By licensed PT), OT (By licensed OT), Speech therapy     PT Frequency: 5x a week OT Frequency: 5x a week     Speech Therapy Frequency: 3-5x a week      Contractures Contractures Info: Not present    Additional Factors Info                  Current Medications (10/03/2019):  This is the current hospital active medication list Current Facility-Administered Medications  Medication Dose Route Frequency Provider Last Rate Last Admin  .  acetaminophen (TYLENOL) tablet 325-650 mg  325-650 mg Oral Q4H PRN Bary Leriche, PA-C   650 mg at 10/02/19 2031  . alum & mag hydroxide-simeth (MAALOX/MYLANTA) 200-200-20 MG/5ML suspension 30 mL  30 mL Oral Q4H PRN Love, Pamela S, PA-C      . amantadine (SYMMETREL) 50 MG/5ML solution 100 mg  100 mg Oral BID Meredith Staggers, MD   100 mg at 10/03/19 0834  . atenolol (TENORMIN) tablet 25 mg  25 mg Oral Daily Meredith Staggers, MD   25 mg at 10/03/19 0834  . bisacodyl (DULCOLAX) suppository 10 mg  10 mg Rectal  Daily PRN Bary Leriche, PA-C   10 mg at 09/26/19 [id number]  . cloNIDine (CATAPRES) tablet 0.1 mg  0.1 mg Oral Q6H PRN Bary Leriche, PA-C   0.1 mg at 10/01/19 1124  . diphenhydrAMINE (BENADRYL) 12.5 MG/5ML elixir 12.5-25 mg  12.5-25 mg Oral Q6H PRN Love, Pamela S, PA-C      . escitalopram (LEXAPRO) tablet 5 mg  5 mg Oral QHS Meredith Staggers, MD   5 mg at 10/02/19 2031  . ezetimibe (ZETIA) tablet 10 mg  10 mg Oral Daily Bary Leriche, PA-C   10 mg at 10/03/19 [id number]  . guaiFENesin-dextromethorphan (ROBITUSSIN DM) 100-10 MG/5ML syrup 5-10 mL  5-10 mL Oral Q6H PRN Love, Pamela S, PA-C      . insulin aspart (novoLOG) injection 0-5 Units  0-5 Units Subcutaneous QHS Love, Pamela S, PA-C      . insulin aspart (novoLOG) injection 0-9 Units  0-9 Units Subcutaneous TID WC Love, Ivan Anchors, PA-C   1 Units at 10/01/19 1254  . lactulose (CHRONULAC) 10 GM/15ML solution 20 g  20 g Oral BID PRN Bary Leriche, PA-C   20 g at 09/24/19 0528  . MEDLINE mouth rinse  15 mL Mouth Rinse q12n4p Bary Leriche, PA-C   15 mL at 10/02/19 1823  . methylphenidate (RITALIN) tablet 5 mg  5 mg Oral BID WC Meredith Staggers, MD   5 mg at 10/03/19 0600  . pantoprazole sodium (PROTONIX) 40 mg/20 mL oral suspension 40 mg  40 mg Oral QHS Love, Pamela S, PA-C   40 mg at 10/02/19 2031  . prochlorperazine (COMPAZINE) tablet 5-10 mg  5-10 mg Oral Q6H PRN Love, Pamela S, PA-C       Or  . prochlorperazine (COMPAZINE) injection 5-10 mg  5-10 mg Intramuscular Q6H PRN Love, Pamela S, PA-C       Or  . prochlorperazine (COMPAZINE) suppository 12.5 mg  12.5 mg Rectal Q6H PRN Love, Pamela S, PA-C      . senna-docusate (Senokot-S) tablet 2 tablet  2 tablet Oral BID Meredith Staggers, MD   2 tablet at 10/03/19 [phone]  . sorbitol 70 % solution 30 mL  30 mL Oral Daily PRN Meredith Staggers, MD      . traZODone (DESYREL) tablet 25-50 mg  25-50 mg Oral QHS PRN Bary Leriche, PA-C   50 mg at 09/26/19 2153  . triamcinolone 0.1 % cream : eucerin cream, 1:1    Topical BID Bary Leriche, [place]   Given at 10/03/19 [id number]     Discharge Medications: Please see discharge summary for a list of discharge medications.  Relevant Imaging Results:  Relevant Lab Results:   Additional Information    Dyanne Iha

## 2019-10-03 NOTE — Progress Notes (Signed)
Patient ID: David [name], male   DOB: 1924/05/03, 86 y.o.   MRN: [id number]  Bed offer accepted at [facility], patient tentative to discharge 5/24

## 2019-10-03 NOTE — Progress Notes (Signed)
Occupational Therapy Session Note  Patient Details  Name: David [name] MRN: [id number] Date of Birth: Jan 16, 1924  Today's Date: 10/03/2019 OT Individual Time: 1415-1444 OT Individual Time Calculation (min): 29 min    Short Term Goals: Week 4:  OT Short Term Goal 1 (Week 4): LTG=STG 2/2 ELOS  Skilled Therapeutic Interventions/Progress Updates:    Pt seated up in TIS chair at time of session, slightly reclined, agreeable to OT session. Pt verbalized need to use bathroom. Stedy placed in front of pt, sit to stand with instructions to pull from bar, unable to use RUE to assist but therapist placed on Stedy handle to encourage use and decrease neglect, sit to stand at [facility] with Mod A. Stedy placed in front of standard toilet, pt able to control descent with therapist assist and verbal cues. Dependent for brief change this date. After brief changed, sit to stand with use of Stedy to pull up with Max Ax2 and therapist assist to guard/protect RUE d/t decreased awareness and high risk of skin tears. Static standing at [facility] while therapist performed clothing management to don brief and pants over hips. Explained role of Stedy and toilet transfers in OT sessions to daughter in room and OT POC. Consulted with social work as well regarding DC planning. Pt transferred via Stedy back to [place] chair, slightly reclined, all needs met and daughter in room.   Therapy Documentation Precautions:  Precautions Precautions: Fall Precaution Comments: R hemi; R inattention Restrictions Weight Bearing Restrictions: No   Therapy/Group: Individual Therapy  Viona Gilmore 10/03/2019, 3:15 PM

## 2019-10-03 NOTE — Progress Notes (Signed)
Speech Language Pathology Daily Session Note  Patient Details  Name: David [name] MRN: [id number] Date of Birth: 07/08/1923  Today's Date: 10/03/2019 SLP Individual Time: 0830-0900 SLP Individual Time Calculation (min): 30 min  Short Term Goals: Week 3: SLP Short Term Goal 1 (Week 3): Patient will utilize speech intelligibility strategies at the phrase level to achieve ~90% intelligibility with Mod verbal cues. SLP Short Term Goal 2 (Week 3): Patient will verbalize wants/needs at the sentence level with Mod verbal cues. SLP Short Term Goal 3 (Week 3): Patient will demonstrate sustained attention to functional tasks for 30 minutes with Mod verbal cues for redirection. SLP Short Term Goal 4 (Week 3): Patient will demonstrate orientation to place, time and situation with Mod A multimodal cues. SLP Short Term Goal 5 (Week 3): Patient will consume trials of ice chips with minimal overt s/s of aspiration over 2 sessions with Min verbal cues to assess readiness for repeat MBS SLP Short Term Goal 5 - Progress (Week 3): Met SLP Short Term Goal 6 (Week 3): Patient will demonstrate efficient mastication and complete oral clearance with trials of Dys. 2 textures without overt s/s of aspiration over 2 sessions prior to upgrade.  Skilled Therapeutic Interventions: Skilled treatment session focused on dysphagia and cognitive goals. SLP facilitated session by providing trials of Dys. 2 textures. Patient required Min verbal cues for use of small bites but demonstrated mildly prolonged mastication with complete oral clearance without overt s/s of aspiration. Recommend trial tray prior to upgrade.  SLP also facilitated session by providing supervision level verbal cues for sorting coins from a field of 4 and Mod A verbal cues for functional problem solving during a basic money management task. Patient left upright in bed with alarm on and all needs within reach. Continue with current plan of care.       Pain No/Denies Pain   Therapy/Group: Individual Therapy  [name] 10/03/2019, 3:26 PM

## 2019-10-03 NOTE — Progress Notes (Signed)
Physical Therapy Session Note  Patient Details  Name: David [name] MRN: [id number] Date of Birth: [birth year]  Today's Date: 10/03/2019 PT Individual Time: [phone] [id number] PT Individual Time Calculation (min): 42 min and 30 min   Short Term Goals: Week 4:  PT Short Term Goal 1 (Week 4): STG=LTG due to ELOS  Skilled Therapeutic Interventions/Progress Updates:     Session 1: Patient in bed wit his daughter, David [name], in the room upon PT arrival. Patient alert and agreeable to PT session. Patient denied pain during session and did not display any signs of pain, however, did display signs of discomfort due to needing to have a BM at beginning of session.  Therapeutic Activity: Bed Mobility: Patient performed supine to/from sit with mod A. Provided verbal cues for rolling to the R and pushing through his L UE using the bed rail to sit up. He performed scooting in bed with mod A +2 and lateral scooting in hood-lying with min A of 1.  Transfers: Patient performed sit to/from stand from the bed, steady seat x2, and BSC using the [employer] with max A of 1 person, noted increased R LE flexor tone limiting standing. Provided verbal cues for forward weight shift and hip, knee, and trunk extension with manual facilitation. Patient was continent of bowl on [facility], RN made aware. Required total A for peri-care and doffing/donning incontinence brief. Provided cues for relaxation and diaphragmatic breathing during BM to reduce straining. Educated patient and his daughter about risks associated with straining during BM including increased intraabdominal pressure, syncope, and elevated BP.   Noted intermittent motor activation in R UE and LE with mobility throughout session. Following transfer bed>BSC in the [facility] patient had a skin tear on his R arm, RN made aware and applied dressing during session.  Patient in bed with his daughter in the room at end of session with breaks locked, bed alarm set, and all needs within  reach.   Session 2:  Patient in TIS w/c with his daughter, David [name], in the room upon PT arrival. Patient alert and agreeable to PT session. Patient denied pain during session. Patient's daughter very emotional about upcoming SNF placement. Educated on increased LOS at SNF and benefits of continuation of therapies at a lower intensity for a longer duration to allow for the patient to continue to improve motor recovery of R side and functional mobility prior to returning home. Educated about equipment needs being provided by SNF base on his level of function/recovery, and his daughter asked about a brace for his foot for walking. PT educated on patient's motor recovery of his R LE and limitations to recovery if braced too soon. Encouraged her to ask again at [facility] and an appropriate brace will be provided based on patient's level of recovery/continuing deficits. Asked patient about his feelings and if he had any questions about d/c to SNF. Patient stated "sounds like what's best." He then asked to get back to bed, stating that he was fatigued.   Therapeutic Activity: Bed Mobility: Patient performed sit to supine with mod A for trunk and LE support. Provided verbal cues for brining LEs onto the bed as his trunk lowered to the bed. Performed lateral scoot and sliding up in the bed as above Transfers: Patient performed a lateral scoot transfer TIS w/c>bed with mod-max A. Provided cues for hand placement and head-hips relationship for proper technique and decreased assist with transfers.   Patient in bed with R PRAFO donned and R UR elevated with  his daughter in the room at end of session with breaks locked, bed alarm set, and all needs within reach.    Therapy Documentation Precautions:  Precautions Precautions: Fall Precaution Comments: R hemi; R inattention Restrictions Weight Bearing Restrictions: No    Therapy/Group: Individual Therapy  Myriam Brandhorst L Quintel Mccalla PT, DPT  10/03/2019, 4:38 PM

## 2019-10-03 NOTE — Progress Notes (Signed)
Occupational Therapy Session Note  Patient Details  Name: SUNJAY ANIELLO MRN: [id number] Date of Birth: 01-24-1924  Today's Date: 10/03/2019 OT Individual Time: 1030-1130 OT Individual Time Calculation (min): 60 min   Short Term Goals: Week 4:  OT Short Term Goal 1 (Week 4): LTG=STG 2/2 ELOS  Skilled Therapeutic Interventions/Progress Updates:    Pt greeted semi-reclined in bed with daughter present. Daughter reports pt was up to [facility] with PT and had a good BM! Pt agreeable to another therapy session. Total  A to thread pants in bed, then OT brought R LE into flexion and pt able to slightly lift hips and assist with pull pants up with bridging. Max A for bed mobilit. Stedy used for sit<>stand transfer to wc with mod A to come to standing. Pt with much improved initiation of hip and trunk extension when standing. Pt brought to the sink in wc and brushed his teeth with set-up and min A. Pt did a good job following commands to spit out toothpaste and water when rinsing. OT administered water protocol. Pt took 2 sips of water and had coughing episode after each sip. Worked on donning socks and shoes with OT able to get R LE into figure 4 position today and pt able to assist with doffing old sock and pulling up new sock. Pt also able to activate R knee extension with prompted to help OT place R foot in shoe. Pt brought to therapy gym and worked on sit<>stands from standing frame with mod A of 1. Worked on trunk extension in standing with reaching up and forward to collect sticky notes off of mirror. OT also facilitated weight bearing towel pushes through R elbow while standing. Pt returned to room and left seated in wc with alarm belt on and needs me, daughter present.   Therapy Documentation Precautions:  Precautions Precautions: Fall Precaution Comments: R hemi; R inattention Restrictions Weight Bearing Restrictions: No Pain:   denies pain  Therapy/Group: Individual Therapy  Valma Cava 10/03/2019, 11:10 AM

## 2019-10-03 NOTE — Progress Notes (Signed)
[place] PHYSICAL MEDICINE & REHABILITATION PROGRESS NOTE  Subjective/Complaints: Last BM on 5/15. No BM last night despite soapsud enema. Discussed with nurse [place]- last time he had benefit with fleet enema- will try again today. Daughter reports that he does appear uncomfortable at times due to constipation  ROS: Limited due to cognitive/behavioral    Objective: No results found. No results for input(s): WBC, HGB, HCT, PLT in the last 72 hours. No results for input(s): NA, K, CL, CO2, GLUCOSE, BUN, CREATININE, CALCIUM in the last 72 hours.  Intake/Output Summary (Last 24 hours) at 10/03/2019 1009 Last data filed at 10/03/2019 0837 Gross per 24 hour  Intake 320 ml  Output --  Net 320 ml     Physical Exam: Vital Signs Blood pressure (!) 161/69, pulse 63, temperature 97.7 F (36.5 C), temperature source Oral, resp. rate 18, height 5\' 11"  (1.803 m), weight 85.3 kg, SpO2 95 %. Constitutional: No distress . Vital signs reviewed. HEENT: EOMI, oral membranes moist Neck: supple Cardiovascular: RRR without murmur. No JVD    Respiratory/Chest: CTA Bilaterally without wheezes or rales. Normal effort    GI/Abdomen: BS +, non-tender, non-distended Ext: no clubbing, cyanosis, or edema Psych: pleasant, more engaging and positive Musc: No edema in extremities.  No tenderness in extremities. Neurological:   Speech remains much more clear. conversationally more appropriate.  Flexor tone RUE, extensor tone RLE still present. --> MAS 1 to 1+/4  Skin: numerous bruises/lacs on arms all stable.  Small area redness near sacrum/buttocks improving  Assessment/Plan: 1. Functional deficits secondary to left BG ICH which require 3+ hours per day of interdisciplinary therapy in a comprehensive inpatient rehab setting.  Physiatrist is providing close team supervision and 24 hour management of active medical problems listed below.  Physiatrist and rehab team continue to assess barriers to  discharge/monitor patient progress toward functional and medical goals  Care Tool:  Bathing        Body parts bathed by helper: Front perineal area, Buttocks     Bathing assist Assist Level: 2 Helpers     Upper Body Dressing/Undressing Upper body dressing Upper body dressing/undressing activity did not occur (including orthotics): N/A What is the patient wearing?: Hospital gown only    Upper body assist Assist Level: Dependent - Patient 0%    Lower Body Dressing/Undressing Lower body dressing      What is the patient wearing?: Incontinence brief, Pants     Lower body assist Assist for lower body dressing: Dependent - Patient 0%     Toileting Toileting    Toileting assist Assist for toileting: 2 Helpers     Transfers Chair/bed transfer  Transfers assist  Chair/bed transfer activity did not occur: Safety/medical concerns  Chair/bed transfer assist level: Moderate Assistance - Patient 50 - 74% Chair/bed transfer assistive device: Armrests   Locomotion Ambulation   Ambulation assist   Ambulation activity did not occur: Safety/medical concerns  Assist level: Maximal Assistance - Patient 25 - 49% Assistive device: Other (comment)(L rail) Max distance: 15'   Walk 10 feet activity   Assist  Walk 10 feet activity did not occur: Safety/medical concerns  Assist level: Maximal Assistance - Patient 25 - 49% Assistive device: Other (comment)(L rail)   Walk 50 feet activity   Assist Walk 50 feet with 2 turns activity did not occur: Safety/medical concerns         Walk 150 feet activity   Assist Walk 150 feet activity did not occur: Safety/medical concerns  Walk 10 feet on uneven surface  activity   Assist Walk 10 feet on uneven surfaces activity did not occur: Safety/medical concerns         Wheelchair     Assist Will patient use wheelchair at discharge?: Yes Type of Wheelchair: Manual Wheelchair activity did not occur:  Safety/medical concerns(lethargy; TIS would be dependent)  Wheelchair assist level: Moderate Assistance - Patient 50 - 74% Max wheelchair distance: 26'    Wheelchair 50 feet with 2 turns activity    Assist            Wheelchair 150 feet activity     Assist          Blood pressure (!) 161/69, pulse 63, temperature 97.7 F (36.5 C), temperature source Oral, resp. rate 18, height 5\' 11"  (1.803 m), weight 85.3 kg, SpO2 95 %.  Medical Problem List and Plan: 1.Functional, mobility and cognitive deficitssecondary to left basal ganglia hemorrhage  -Continue CIR PT, OT, SLP   -therapy intensity increased back to 3hr per day  -spastic right hemiparesis: Not a candidate d/t lethargy for oral antispasmodics. ?botox as outpt   -PRAFO/AFO, ROM with therapy/family       -arousal still can wax/wane: recent CT of head was stable to improved in appearance.   -family has opted for SNF given his ongoing care needs. Continue therapies as above in the meantime 2. Antithrombotics: -DVT/anticoagulation:Mechanical:Sequential compression devices, below kneeBilateral lower extremities--platelets stable to increased 90k -antiplatelet therapy: N/A 3.Chronic LBP/Pain Management:Tylenol prn.well controlled 4. Mood:LCSW to follow for evaluation and support.  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 Neuropsych: This patientis notcapable of making decisions on hisown behalf.  -  Amantadine increased to 100mg  bid    -ritalin started 5/11 at 2.5mg , increase to 5mg  bid 5/13. Bp/hr ok   -continue   5/20 will reduce amantadine to daily and observe 6. Skin/Wound Care:Routine pressure relief measures. 7. Fluids/Electrolytes/Nutrition:resumed po diet   5/11 bmet reviewed from 5/10 and stable. Changed IVF to HS  5/17 labs all stable to improved!  5/18 given fluid upgrade will see how he does off hs IVF 8. HTN:   SBP goal<140--added prn catapres  -resumed low dose tenormin 5/5--> DBP still elevated. Increased to 25mg   Controlled on 5/20  9.H/oCOPD:Stable without meds. 10. Impaired glucose tolerance:CBG's wnl so far.  - SSI as indicated.  5/16 controlled 11. CAD/AVR:Off ASA due to bleed. No statin due to intolerance. Tenormin, cozaar and lasix (used prn) on hold.  -no TEE indicated 12. Thrombocytopenia:   Platelets decreased to 122 on 5/11.  - drop to 97k 5/17--has been as low as 73k during this admit  -recheck tomorrow 13. AKI: Creatinine 1.11 on 5/11, continue to monitor weekly.  14.  Post stroke dysphagia:               -aspiration precautions  - pharyngeal dysphagia.    -diet changed to D1/nectars--upgrade soon to D2? 15. Constipation: Senna at bedtime  - no bm since 5/14  5/18---sorbitol 60cc today, SSE if needed  5/20 had bm's yesterday and again this morning   -add scheduled miralax to daily regimen  LOS: 24 days A FACE TO [place] 10/03/2019, 10:09 AM

## 2019-10-03 NOTE — Plan of Care (Signed)
Mod assist adls 

## 2019-10-04 ENCOUNTER — Inpatient Hospital Stay (HOSPITAL_COMMUNITY): Payer: Medicare Other | Admitting: Speech Pathology

## 2019-10-04 ENCOUNTER — Inpatient Hospital Stay (HOSPITAL_COMMUNITY): Payer: Medicare Other | Admitting: Occupational Therapy

## 2019-10-04 ENCOUNTER — Inpatient Hospital Stay (HOSPITAL_COMMUNITY): Payer: Medicare Other

## 2019-10-04 DIAGNOSIS — F09 Unspecified mental disorder due to known physiological condition: Secondary | ICD-10-CM

## 2019-10-04 DIAGNOSIS — F0789 Other personality and behavioral disorders due to known physiological condition: Secondary | ICD-10-CM

## 2019-10-04 LAB — BASIC METABOLIC PANEL
Anion gap: 8 (ref 5–15)
BUN: 13 mg/dL (ref 8–23)
CO2: 28 mmol/L (ref 22–32)
Calcium: 9.2 mg/dL (ref 8.9–10.3)
Chloride: 104 mmol/L (ref 98–111)
Creatinine, Ser: 1.22 mg/dL (ref 0.61–1.24)
GFR calc Af Amer: 58 mL/min — ABNORMAL LOW (ref >60)
GFR calc non Af Amer: 50 mL/min — ABNORMAL LOW (ref >60)
Glucose, Bld: 102 mg/dL — ABNORMAL HIGH (ref 70–99)
Potassium: 3.7 mmol/L (ref 3.5–5.1)
Sodium: 140 mmol/L (ref 135–145)

## 2019-10-04 LAB — GLUCOSE, CAPILLARY
Glucose-Capillary: 103 mg/dL — ABNORMAL HIGH (ref 70–99)
Glucose-Capillary: 128 mg/dL — ABNORMAL HIGH (ref 70–99)
Glucose-Capillary: 126 mg/dL — ABNORMAL HIGH (ref 70–99)
Glucose-Capillary: 146 mg/dL — ABNORMAL HIGH (ref 70–99)

## 2019-10-04 LAB — CBC
HCT: 48.5 % (ref 39.0–52.0)
Hemoglobin: 16.3 g/dL (ref 13.0–17.0)
MCH: 30.9 pg (ref 26.0–34.0)
MCHC: 33.6 g/dL (ref 30.0–36.0)
MCV: 91.9 fL (ref 80.0–100.0)
Platelets: 83 10*3/uL — ABNORMAL LOW (ref 150–400)
RBC: 5.28 MIL/uL (ref 4.22–5.81)
RDW: 13.7 % (ref 11.5–15.5)
WBC: 7.9 10*3/uL (ref 4.0–10.5)
nRBC: 0 % (ref 0.0–0.2)

## 2019-10-04 MED ORDER — ESCITALOPRAM OXALATE 5 MG PO TABS: 5.0000 mg | ORAL_TABLET | Freq: Every day | ORAL | Status: DC

## 2019-10-04 MED ORDER — PANTOPRAZOLE SODIUM 40 MG PO PACK: 40.0000 mg | PACK | Freq: Every day | ORAL | Status: DC

## 2019-10-04 MED ORDER — AMANTADINE HCL 50 MG/5ML PO SYRP: 100.0000 mg | ORAL_SOLUTION | Freq: Two times a day (BID) | ORAL | 0 refills | Status: DC

## 2019-10-04 MED ORDER — METHYLPHENIDATE HCL 5 MG PO TABS: 5.0000 mg | ORAL_TABLET | Freq: Two times a day (BID) | ORAL | 0 refills | Status: DC

## 2019-10-04 MED ORDER — TRIAMCINOLONE 0.1 % CREAM:EUCERIN CREAM 1:1: 1.0000 "application " | TOPICAL_CREAM | Freq: Two times a day (BID) | CUTANEOUS | Status: DC

## 2019-10-04 MED ORDER — LACTULOSE 10 GM/15ML PO SOLN: 20.0000 g | Freq: Two times a day (BID) | ORAL | 0 refills | Status: DC | PRN

## 2019-10-04 MED ORDER — SENNOSIDES-DOCUSATE SODIUM 8.6-50 MG PO TABS: 2.0000 | ORAL_TABLET | Freq: Two times a day (BID) | ORAL | Status: DC

## 2019-10-04 NOTE — Discharge Summary (Addendum)
Physician Discharge Summary  Patient ID: David [name] MRN: [id number] DOB/AGE: 05/30/1923 86 y.o.  Admit date: 09/09/2019 Discharge date: 10/06/2019  Discharge Diagnoses:  Principal Problem:   ICH (intracerebral hemorrhage) ([place]) Active Problems:   Global aphasia   Dysphagia, post-stroke   AKI (acute kidney injury) ([place])   Labile blood pressure   Cognitive and neurobehavioral dysfunction   Discharged Condition: Stable  Significant Diagnostic Studies: DG Chest 2 View  Result Date: 09/13/2019 CLINICAL DATA:  Pneumonia EXAM: CHEST - 2 VIEW COMPARISON:  09/10/2019 FINDINGS: Low volume chest with interstitial coarsening that is unchanged. No focal consolidation. Stable normal heart size. 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 IMPRESSION: Low volume chest with no convincing pneumonia. Electronically Signed   By: Monte Fantasia M.D.   On: 09/13/2019 08:58   DG Chest 2 View  Result Date: 09/10/2019 CLINICAL DATA:  Fever EXAM: CHEST - 2 VIEW COMPARISON:  09/04/2019 FINDINGS: Prior CABG and aortic valve replacement. The heart size and mediastinal contours are stable. Low lung volumes. Increased interstitial markings within the left lung base, slightly progressed from prior. No pneumothorax. IMPRESSION: Increased interstitial markings within the left lung base, slightly progressed from prior. Findings could represent atelectasis versus pneumonia. Electronically Signed   By: Davina Poke D.O.   On: 09/10/2019 16:33   DG Abd 1 View  Result Date: 09/10/2019 CLINICAL DATA:  Abdominal distension. EXAM: ABDOMEN - 1 VIEW COMPARISON:  None. FINDINGS: The bowel gas pattern is normal. Residual contrast is noted in the colon. No radio-opaque calculi or other significant radiographic abnormality are seen. IMPRESSION: No evidence of bowel obstruction or ileus. Electronically Signed   By: Marijo Conception M.D.   On: 09/10/2019 16:32   CT HEAD WO CONTRAST  Result Date: 09/23/2019 CLINICAL  DATA:  Worsening confusion and speech; speech difficulty. EXAM: CT HEAD WITHOUT CONTRAST TECHNIQUE: Contiguous axial images were obtained from the base of the skull through the vertex without intravenous contrast. COMPARISON:  Head CT 09/10/2019 FINDINGS: Brain: There has been an interval decrease in size and conspicuity of a parenchymal hemorrhage centered within the posterior left basal ganglia. Remaining hyperdense hemorrhage at this site now measures 3.1 x 1.8 cm in transaxial dimensions (previously 4.4 x 2.5 cm). Surrounding edema has not significantly changed. Previously demonstrated small volume hemorrhage within the occipital horns of the lateral ventricles is no longer appreciable. Redemonstrated chronic bilateral basal ganglia lacunar infarcts. Stable background chronic small vessel ischemic disease within the cerebral white matter. Mild generalized parenchymal atrophy. There is no hydrocephalus. No midline shift or extra-axial fluid collection. No demarcated cortical infarct. Vascular: No hyperdense vessel. Atherosclerotic calcifications. Skull: Normal. Negative for fracture or focal lesion. Sinuses/Orbits: Visualized orbits show no acute finding. Mild ethmoid sinus mucosal thickening. No significant mastoid effusion. IMPRESSION: 1. Interval decrease in size of a left basal ganglia parenchymal hemorrhage as compared to head CT 09/10/2019. Remaining hyperdense hemorrhage now measures 3.1 x 1.8 cm in transaxial dimensions. Associated edema has not significantly changed. 2. Previously demonstrated small volume intraventricular hemorrhage is no longer appreciable. 3. No interval intracranial abnormality is identified. 4. Stable background generalized parenchymal atrophy and chronic small vessel ischemic disease. Electronically Signed   By: Kellie Simmering DO   On: 09/23/2019 16:46   CT HEAD WO CONTRAST  Result Date: 09/10/2019 CLINICAL DATA:  Follow-up left basal ganglia hemorrhage. Increased lethargy. EXAM:  CT HEAD WITHOUT CONTRAST TECHNIQUE: Contiguous axial images were obtained from the base of the skull through the vertex without  intravenous contrast. COMPARISON:  09/04/2019 FINDINGS: Brain: Slight enlargement of the left basal ganglia intraparenchymal hemorrhage, measuring 4.4 x 2.5 x 3.6 cm (volume = 21 cm^3) today compared with 16 cc volume 6 days ago. Additionally, there is intraventricular penetration, having breach the atrium of the left lateral ventricle, with a small amount of blood dependent in both occipital horns. Ventricular size remains stable however without hydrocephalus. Surrounding edema is slightly more pronounced. One could question if there has been extension of infarction more anteriorly within the basal ganglia and external capsule or if this relates to edema from the hemorrhage. Other small-vessel ischemic changes throughout brain appear stable. No new large vessel infarction. No extra-axial collection. Vascular: There is atherosclerotic calcification of the major vessels at the base of the brain. Skull: Negative Sinuses/Orbits: Clear/normal Other: None IMPRESSION: Slight enlargement of the left basal ganglia intraparenchymal hemorrhage when compared to the study of 6 days ago. Previous volume was calculated at 16 cc. Today calculated at 21 cc. There is also intraventricular penetration with a small amount of blood dependent in the occipital horns of both lateral ventricles but no hydrocephalus. Slightly more surrounding edema as expected. Some edema extends anterior into the basal ganglia and external capsule region, which could either be extension of edema or slight extension of the infarction itself. These results will be called to the ordering clinician or representative by the Radiologist Assistant, and communication documented in the PACS or [employer]. Electronically Signed   By: Nelson Chimes M.D.   On: 09/10/2019 16:57   DG CHEST PORT 1 VIEW  Result Date: 10/05/2019 CLINICAL  DATA:  Shortness of breath EXAM: PORTABLE CHEST 1 VIEW COMPARISON:  09/13/2019 FINDINGS: Cardiac shadow is within normal limits. Postsurgical changes are noted. No focal infiltrate or sizable effusion is seen. No bony abnormality is noted. IMPRESSION: No active disease. Electronically Signed   By: Inez Catalina M.D.   On: 10/05/2019 14:39   DG Swallowing Func-Speech Pathology  Result Date: 10/01/2019 Objective Swallowing Evaluation: Type of Study: MBS-Modified Barium Swallow Study  Patient Details Name: EARVIN SHIROMA MRN: [id number] Date of Birth: 23-Nov-1923 Today's Date: 10/01/2019 Time: SLP Start Time (ACUTE ONLY): 0900 -SLP Stop Time (ACUTE ONLY): 0930 SLP Time Calculation (min) (ACUTE ONLY): 30 min Past Medical History: Past Medical History: Diagnosis Date . CAD (coronary artery disease)   intolerance of statins . COPD (chronic obstructive pulmonary disease) ([place])  . Current use of long term anticoagulation  . GERD (gastroesophageal reflux disease)  . Hyperlipidemia  . Hypertension  . Impaired glucose tolerance 02/14/2011 . LBP (low back pain)  . OA (osteoarthritis)  . S/P AVR (aortic valve replacement) 2009  bonine valve Past Surgical History: Past Surgical History: Procedure Laterality Date . AORTIC VALVE REPLACEMENT  01/15/1999  bovine . CARDIAC CATHETERIZATION  10/05/1998  Recommend CABG revascularizaition . CARDIAC CATHETERIZATION  06/11/1999  Continue medical therapy . CARDIAC CATHETERIZATION  01/08/2007  Patent grafts, recommend medical therapy . CARDIOVASCULAR STRESS TEST  04/22/2010  Small lateral defect which is partially reversible, no ischemic EKG changes were noted. Marland Kitchen CAROTID DOPPLER  04/22/2010  Bilateral ICAs-no evidence of diametere reduction, significant tortuosity, or any other vascular abnormality. . CORONARY ARTERY BYPASS GRAFT  10/09/1998  LIMA to LAD, SVG to diag, SVG sequential to 2 marginals, SVG sequential to PDA & PLA. CE#25 porcine AVR. Marland Kitchen TRANSTHORACIC ECHOCARDIOGRAM  05/14/2012  EF 50-55%,  bioprosthesis of the aortic valve with well preserved LV function, moderate concentric [place] HPI: See H&P  Subjective: alert  Assessment / Plan / Recommendation CHL IP CLINICAL IMPRESSIONS 10/01/2019 Clinical Impression Patient presents with a moderate oropharyngeal dysphagia characterized by intermittent premature loss of bolus into the pharynx with delayed and inconsistent onset of pharyngeal swallow.  Inconsistent timing of his swallow resulted in flash penetration with nectar-thick liquids and intermittent silent aspiration of thin liquids. A cued cough/throat clear was effective in clearing the majority of the aspirates.  Moderate oral reside was noted with solid textures that required a liquid wash to clear.  Recommend patient upgrade to nectar-thick liquids and initiate the water protocol with focus on quick, single sips of thin liquids.  Educated daughter regarding results, she verbalized understanding. SLP Visit Diagnosis Dysphagia, oropharyngeal phase (R13.12) Attention and concentration deficit following -- Frontal lobe and executive function deficit following -- Impact on safety and function Mild aspiration risk;Moderate aspiration risk   CHL IP TREATMENT RECOMMENDATION 10/01/2019 Treatment Recommendations Therapy as outlined in treatment plan below   Prognosis 10/01/2019 Prognosis for Safe Diet Advancement Good Barriers to Reach Goals -- Barriers/Prognosis Comment -- CHL IP DIET RECOMMENDATION 10/01/2019 SLP Diet Recommendations Dysphagia 1 (Puree) solids;Nectar thick liquid;Free water protocol after oral care Liquid Administration via Cup;Straw Medication Administration Crushed with puree Compensations Minimize environmental distractions;Slow rate;Small sips/bites;Follow solids with liquid;Monitor for anterior loss Postural Changes --   CHL IP OTHER RECOMMENDATIONS 10/01/2019 Recommended Consults -- Oral Care Recommendations Oral care BID Other Recommendations Order thickener from pharmacy;Prohibited food  (jello, ice cream, thin soups);Clarify dietary restrictions;Remove water pitcher;Have oral suction available   CHL IP FOLLOW UP RECOMMENDATIONS 10/01/2019 Follow up Recommendations Inpatient Rehab   CHL IP FREQUENCY AND DURATION 10/01/2019 Speech Therapy Frequency (ACUTE ONLY) min 3x week Treatment Duration 2 weeks      CHL IP ORAL PHASE 10/01/2019 Oral Phase Impaired Oral - Pudding Teaspoon -- Oral - Pudding Cup -- Oral - Honey Teaspoon NT Oral - Honey Cup NT Oral - Nectar Teaspoon NT Oral - Nectar Cup Delayed oral transit;Right pocketing in lateral sulci;Premature spillage;Decreased bolus cohesion Oral - Nectar Straw Delayed oral transit;Decreased bolus cohesion;Premature spillage Oral - Thin Teaspoon NT Oral - Thin Cup -- Oral - Thin Straw Decreased bolus cohesion;Premature spillage;Delayed oral transit;Right anterior bolus loss Oral - Puree Right anterior bolus loss Oral - Mech Soft Impaired mastication;Lingual/palatal residue Oral - Regular -- Oral - Multi-Consistency -- Oral - Pill -- Oral Phase - Comment --  CHL IP PHARYNGEAL PHASE 10/01/2019 Pharyngeal Phase Impaired Pharyngeal- Pudding Teaspoon -- Pharyngeal -- Pharyngeal- Pudding Cup -- Pharyngeal -- Pharyngeal- Honey Teaspoon NT Pharyngeal -- Pharyngeal- Honey Cup NT Pharyngeal -- Pharyngeal- Nectar Teaspoon NT Pharyngeal -- Pharyngeal- Nectar Cup Delayed swallow initiation-vallecula;Penetration/Aspiration during swallow Pharyngeal Material enters airway, remains ABOVE vocal cords then ejected out Pharyngeal- Nectar Straw Delayed swallow initiation-vallecula;Penetration/Aspiration during swallow Pharyngeal Material enters airway, remains ABOVE vocal cords then ejected out Pharyngeal- Thin Teaspoon NT Pharyngeal -- Pharyngeal- Thin Cup Delayed swallow initiation-pyriform sinuses;Penetration/Aspiration during swallow Pharyngeal Material enters airway, passes BELOW cords without attempt by patient to eject out (silent aspiration);Material enters airway, CONTACTS  cords and then ejected out;Material enters airway, remains ABOVE vocal cords and not ejected out;Material does not enter airway Pharyngeal- Thin Straw Penetration/Aspiration during swallow;Delayed swallow initiation-pyriform sinuses Pharyngeal Material enters airway, CONTACTS cords and not ejected out Pharyngeal- Puree WFL Pharyngeal -- Pharyngeal- Mechanical Soft -- Pharyngeal -- Pharyngeal- Regular -- Pharyngeal -- Pharyngeal- Multi-consistency -- Pharyngeal -- Pharyngeal- Pill -- Pharyngeal -- Pharyngeal Comment --  No flowsheet data found. [place], [place] 10/01/2019, 9:58 AM  [place], [place], CCC-SLP [phone]           DG Swallowing Func-Speech Pathology  Result Date: 09/12/2019 Objective Swallowing Evaluation: Type of Study: MBS-Modified Barium Swallow Study  Patient Details Name: MARTEZE MARUCA MRN: [id number] Date of Birth: Jun 09, 1923 Today's Date: 09/12/2019 Time: SLP Start Time (ACUTE ONLY): 0900 -SLP Stop Time (ACUTE ONLY): 0930 SLP Time Calculation (min) (ACUTE ONLY): 30 min Past Medical History: Past Medical History: Diagnosis Date . CAD (coronary artery disease)   intolerance of statins . COPD (chronic obstructive pulmonary disease) ([place])  . Current use of long term anticoagulation  . GERD (gastroesophageal reflux disease)  . Hyperlipidemia  . Hypertension  . Impaired glucose tolerance 02/14/2011 . LBP (low back pain)  . OA (osteoarthritis)  . S/P AVR (aortic valve replacement) 2009  bonine valve Past Surgical History: Past Surgical History: Procedure Laterality Date . AORTIC VALVE REPLACEMENT  01/15/1999  bovine . CARDIAC CATHETERIZATION  10/05/1998  Recommend CABG revascularizaition . CARDIAC CATHETERIZATION  06/11/1999  Continue medical therapy . CARDIAC CATHETERIZATION  01/08/2007  Patent grafts, recommend medical therapy . CARDIOVASCULAR STRESS TEST  04/22/2010  Small lateral defect which is partially reversible, no ischemic EKG changes were noted. Marland Kitchen CAROTID DOPPLER  04/22/2010  Bilateral ICAs-no  evidence of diametere reduction, significant tortuosity, or any other vascular abnormality. . CORONARY ARTERY BYPASS GRAFT  10/09/1998  LIMA to LAD, SVG to diag, SVG sequential to 2 marginals, SVG sequential to PDA & PLA. CE#25 porcine AVR. Marland Kitchen TRANSTHORACIC ECHOCARDIOGRAM  05/14/2012  EF 50-55%, bioprosthesis of the aortic valve with well preserved LV function, moderate concentric [place] HPI: See H&P  Subjective: alert Assessment / Plan / Recommendation CHL IP CLINICAL IMPRESSIONS 09/12/2019 Clinical Impression Patient presents with a moderate oropharyngeal dysphagia characterized by premature loss of bolus into the pharynx with delayed and inconsistent onset of pharyngeal swallow.  Patient demonstrated difficulty with self-feeding (question motor planning issues) and following commands requiring Max verbal and tactile cues for use of smaller sips. Patient would hold the cup up to his mouth with neck mildly hyperextended allowing liquids to fill the pharynx prior to swallow initiation.  This led to penetration with nectar-thick liquids via cup and silent aspiration of nectar-thick liquids via straw.  Recommend patient reinitiate conservative diet of Dys. 1 textures with honey-thick liquids to minimize aspiration risk due to patient's impaired and inconsistent cognitive-linguistic abilities, motor planning deficits and decreased mobility.  Educated daughter regarding results, she verbalized understanding. SLP Visit Diagnosis Dysphagia, oropharyngeal phase (R13.12) Attention and concentration deficit following -- Frontal lobe and executive function deficit following -- Impact on safety and function Moderate aspiration risk;Risk for inadequate nutrition/hydration   CHL IP TREATMENT RECOMMENDATION 09/12/2019 Treatment Recommendations Therapy as outlined in treatment plan below   Prognosis 09/06/2019 Prognosis for Safe Diet Advancement Good Barriers to Reach Goals -- Barriers/Prognosis Comment -- CHL IP DIET RECOMMENDATION  09/12/2019 SLP Diet Recommendations Dysphagia 1 (Puree) solids;Honey thick liquids Liquid Administration via Cup;Spoon Medication Administration Crushed with puree Compensations Minimize environmental distractions;Slow rate;Small sips/bites Postural Changes Seated upright at 90 degrees   CHL IP OTHER RECOMMENDATIONS 09/12/2019 Recommended Consults -- Oral Care Recommendations Oral care BID Other Recommendations Order thickener from pharmacy;Prohibited food (jello, ice cream, thin soups);Clarify dietary restrictions;Remove water pitcher;Have oral suction available   CHL IP FOLLOW UP RECOMMENDATIONS 09/12/2019 Follow up Recommendations Inpatient Rehab   CHL IP FREQUENCY AND DURATION 09/12/2019 Speech Therapy Frequency (ACUTE ONLY) min 3x week Treatment Duration 3 weeks      CHL IP  ORAL PHASE 09/12/2019 Oral Phase Impaired Oral - Pudding Teaspoon -- Oral - Pudding Cup -- Oral - Honey Teaspoon Right anterior bolus loss;Delayed oral transit;Decreased bolus cohesion;Premature spillage Oral - Honey Cup Premature spillage;Decreased bolus cohesion;Delayed oral transit;Right anterior bolus loss Oral - Nectar Teaspoon Right anterior bolus loss;Premature spillage;Decreased bolus cohesion;Delayed oral transit Oral - Nectar Cup Delayed oral transit;Decreased bolus cohesion;Premature spillage;Right anterior bolus loss Oral - Nectar Straw Right anterior bolus loss;Premature spillage;Decreased bolus cohesion;Delayed oral transit Oral - Thin Teaspoon NT Oral - Thin Cup -- Oral - Thin Straw -- Oral - Puree Right anterior bolus loss;Delayed oral transit Oral - Mech Soft -- Oral - Regular -- Oral - Multi-Consistency -- Oral - Pill -- Oral Phase - Comment --  CHL IP PHARYNGEAL PHASE 09/12/2019 Pharyngeal Phase Impaired Pharyngeal- Pudding Teaspoon -- Pharyngeal -- Pharyngeal- Pudding Cup -- Pharyngeal -- Pharyngeal- Honey Teaspoon Delayed swallow initiation-vallecula Pharyngeal -- Pharyngeal- Honey Cup Delayed swallow initiation-pyriform  sinuses Pharyngeal -- Pharyngeal- Nectar Teaspoon Delayed swallow initiation-pyriform sinuses Pharyngeal Material does not enter airway Pharyngeal- Nectar Cup Delayed swallow initiation-pyriform sinuses;Penetration/Aspiration during swallow Pharyngeal Material enters airway, remains ABOVE vocal cords and not ejected out Pharyngeal- Nectar Straw Delayed swallow initiation-pyriform sinuses;Penetration/Aspiration during swallow Pharyngeal Material enters airway, passes BELOW cords without attempt by patient to eject out (silent aspiration) Pharyngeal- Thin Teaspoon NT Pharyngeal -- Pharyngeal- Thin Cup -- Pharyngeal -- Pharyngeal- Thin Straw -- Pharyngeal -- Pharyngeal- Puree Delayed swallow initiation-vallecula Pharyngeal -- Pharyngeal- Mechanical Soft -- Pharyngeal -- Pharyngeal- Regular -- Pharyngeal -- Pharyngeal- Multi-consistency -- Pharyngeal -- Pharyngeal- Pill -- Pharyngeal -- Pharyngeal Comment --  No flowsheet data found. [place], [place] 09/12/2019, 9:42 AM    [place]on [place], [place], CCC-SLP [phone]           VAS [place] LOWER EXTREMITY VENOUS (DVT)  Result Date: 09/11/2019  Lower Venous DVTStudy Indications: Immobility.  Risk Factors: None identified. Comparison Study: No prior studies. Performing Technologist: Oliver Hum RVT  Examination Guidelines: A complete evaluation includes B-mode imaging, spectral Doppler, color Doppler, and power Doppler as needed of all accessible portions of each vessel. Bilateral testing is considered an integral part of a complete examination. Limited examinations for reoccurring indications may be performed as noted. The reflux portion of the exam is performed with the patient in reverse Trendelenburg.  +---------+---------------+---------+-----------+----------+--------------+ RIGHT    CompressibilityPhasicitySpontaneityPropertiesThrombus Aging +---------+---------------+---------+-----------+----------+--------------+ CFV      Full           Yes      Yes                                  +---------+---------------+---------+-----------+----------+--------------+ SFJ      Full                                                        +---------+---------------+---------+-----------+----------+--------------+ FV Prox  Full                                                        +---------+---------------+---------+-----------+----------+--------------+ FV Mid   Full                                                        +---------+---------------+---------+-----------+----------+--------------+  FV DistalFull                                                        +---------+---------------+---------+-----------+----------+--------------+ PFV      Full                                                        +---------+---------------+---------+-----------+----------+--------------+ POP      Full           Yes      Yes                                 +---------+---------------+---------+-----------+----------+--------------+ PTV      Full                                                        +---------+---------------+---------+-----------+----------+--------------+ PERO     Full                                                        +---------+---------------+---------+-----------+----------+--------------+   +---------+---------------+---------+-----------+----------+--------------+ LEFT     CompressibilityPhasicitySpontaneityPropertiesThrombus Aging +---------+---------------+---------+-----------+----------+--------------+ CFV      Full           Yes      Yes                                 +---------+---------------+---------+-----------+----------+--------------+ SFJ      Full                                                        +---------+---------------+---------+-----------+----------+--------------+ FV Prox  Full                                                         +---------+---------------+---------+-----------+----------+--------------+ FV Mid   Full                                                        +---------+---------------+---------+-----------+----------+--------------+ FV DistalFull                                                        +---------+---------------+---------+-----------+----------+--------------+   PFV      Full                                                        +---------+---------------+---------+-----------+----------+--------------+ POP      Full           Yes      Yes                                 +---------+---------------+---------+-----------+----------+--------------+ PTV      Full                                                        +---------+---------------+---------+-----------+----------+--------------+ PERO     Full                                                        +---------+---------------+---------+-----------+----------+--------------+     Summary: RIGHT: - There is no evidence of deep vein thrombosis in the lower extremity.  - No cystic structure found in the popliteal fossa.  LEFT: - There is no evidence of deep vein thrombosis in the lower extremity.  - No cystic structure found in the popliteal fossa.  *See table(s) above for measurements and observations. Electronically signed by Servando Snare MD on 09/11/2019 at 8:21:21 PM.    Final     Labs:  Basic Metabolic Panel: BMP Latest Ref Rng & Units 10/04/2019 09/30/2019 09/23/2019  Glucose 70 - 99 mg/dL 102(H) 110(H) 100(H)  BUN 8 - 23 mg/dL 13 9 10   Creatinine 0.61 - 1.24 mg/dL 1.22 1.25(H) 1.11  Sodium 135 - 145 mmol/L 140 139 140  Potassium 3.5 - 5.1 mmol/L 3.7 4.0 4.1  Chloride 98 - 111 mmol/L 104 106 107  CO2 22 - 32 mmol/L 28 25 23   Calcium 8.9 - 10.3 mg/dL 9.2 8.8(L) 9.1    CBC: Recent Labs  Lab 09/30/19 0502 10/04/19 0735 10/05/19 0545  WBC 6.6 7.9 7.3  NEUTROABS 4.1  --   --   HGB 15.0 16.3 15.4  HCT  45.2 48.5 46.2  MCV 92.4 91.9 93.5  PLT 97* 83* 83*    CBG: Recent Labs  Lab 10/05/19 1125 10/05/19 1633 10/05/19 2128 10/06/19 0631 10/06/19 1139  GLUCAP 116* 120* 131* 110* 131*    Brief HPI:   JENESIS HOT is a 86 y.o. male with history of CAD, COPD, LBP, tremors, AVR; who was admitted on 09/04/2019 with right facial weakness, dysarthria, right hemiparesis and left gaze preference.  His daughter had left for an MD appointment and when she returned an hour later patient was lying on the floor between the beds.  CT head in ED showed acute left ICH.  MRI/MRA brain showed stable acute left basal ganglia hemorrhage with e remote basal ganglia lacunar infarcts and advanced white matter disease.  CTA head/neck was negative for vascular malformation and left basal ganglia was negative for spot sign.  Bleed  was felt to be secondary to small vessel disease versus thrombocytopenia as platelets were 93 at admission.   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  Swallow evaluation done revealing moderate dysphagia and he was placed on dysphagia 1, honey liquids by spoon.  Patient with resultant dysphagia, aphasia with delayed processing as well as difficulty following one-step commands, BLE weakness with visual impairments affecting ADLs and mobility.  CIR was recommended for follow-up therapy   Hospital Course: KAZUMA AYALA was admitted to rehab 09/09/2019 for inpatient therapies to consist of PT, ST and OT at least three hours five days a week. Past admission physiatrist, therapy team and rehab RN have worked together to provide customized collaborative inpatient rehab.  On a.m. of 4/27 patient noted to have increase in lethargy,  low-grade fever as well as abdominal distention.  KUB was negative for ileus.   Follow-up CT head done revealing slightly more surrounding edema as expected as well as slight enlargement of left basal ganglia hemorrhage with small amount of blood in occipital horn of bilateral ventricles.  Other work-up done revealed some worsening of renal status therefore IV fluids added. BLE Dopplers were negative for DVT.  Chest x-ray showed some increase in interstitial markings with question of atelectasis versus pneumonia.  Bowel program was augmented to manage constipation   He was started on IV Zosyn empirically and made NPO.  Dr. Lorraine Lax was contacted to review CT and felt that slight increase in bleed was not significant enough to cause lethargy and no intervention needed and that otential for aspiration was likely the cause of lethargy.  He was treated with 4-day course of antibiotics with improvement in mentation and his follow-up x-ray showed no evidence of disease; Zosyn was discontinued.  Follow-up MBS done on 4/29 and he was started on dysphagia 1 with honey liquids due to inconsistent swallow and to minimize aspiration risk.  IV fluids were added for hydration and changed to 12 hours and evening his renal status improved.  His diet has been advanced to dysphagia 2, nectar liquids and he is able to maintain hydration adequately therefore IV fluids were discontinued.  Water/ice chip protocol was initiated and anticipate this will help with his hydration status also.  Blood pressures were monitored on TID basis and low-dose Tenormin was added to help with better control.  He has had intermittent bradycardia however heart rate has been stable overall.  Impaired fasting glucose has been monitored with ac/hs CBG checks and SSI was use prn for tighter BS control. Serial check of lytes show that renal status WNL. CBC showed recurrent drop in platelets and no signs of bleeding noted.  Low-dose Lexapro was added to help  with mood stabilization due to concerns of depressed mood related by family.  Ritalin was added to help with activation and attention but his mental status continued to wax and wane.  Follow-up CT head 5/10 showed decrease in size of bleed but no change in edema and previously small volume IVH no longer appreciable.  Amantadine was also added to help with activation.  His activity tolerance and mentation has been gradually improving.  He has been making slow progress and continues to require extensive amount of assistance.  Family has elected on SNF for progressive therapy and bed is available at [facility].  He was discharged on 05/24.   Rehab course: During patient's stay in rehab weekly team conferences were held to monitor patient's progress, set goals and discuss barriers to discharge. At admission, patient required total assist with ADL tasks and with mobility. He exhibited moderate oropharyngeal dysphagia with impaired, moderate aphasia, decreased ability to follow 2 step commands and mild to moderate dysarthria.  He has had improvement in activity tolerance, balance, postural control as well as ability to compensate for deficits.  He is able to sit at edge of bed with max assist and able to perform sit to stand transfers and steady with mod assist.  He requires max assist +2 for upper body care and total assist for lower body dressing. Dysphagia to nectar liquids with minimal signs of aspiration with min verbal cues.  Speech intelligibility is at 90% at phrase level with min verbal cues.  He is showing improvement and sustain attention to task and recall of daily information.    Disposition: Skilled Nursing facility.   Diet: dysphagia 2, nectar liquids.  Special Instructions: 1. Needs full supervision at meals- Medications administered crushed in puree.  2. Monitor for signs of bleeding.  3. Repeat CBC and BMET on 10/09/19  Discharge Instructions    Ambulatory referral to Physical Medicine  Rehab   Complete by: As directed    4 weeks follow up appointment     Allergies as of 10/06/2019      Reactions   Atorvastatin Other (See Comments)   Unknown reaction   Colesevelam Other (See Comments)   Unknown reaction   Simvastatin Other (See Comments)   Unknown reaction   Sulfa Antibiotics Other (See Comments)   Unknown reaction      Medication List    STOP taking these medications   aspirin EC 81 MG tablet   furosemide 20 MG tablet Commonly known as: LASIX   losartan 25 MG tablet Commonly known as: COZAAR   meclizine 12.5 MG tablet Commonly known as: ANTIVERT   Vitamin D3 75 MCG (3000 UT) Tabs     TAKE these medications   acetaminophen 325 MG tablet Commonly known as: TYLENOL Take 1-2 tablets (325-650 mg total) by mouth every 4 (four) hours as needed for mild pain.   amantadine 50 MG/5ML solution Commonly known as: SYMMETREL Take 10 mLs (100 mg total) by mouth 2 (two) times daily.   atenolol 25 MG tablet Commonly known as: TENORMIN Take 1 tablet (25 mg total) by mouth daily.   escitalopram 5 MG tablet Commonly known as: LEXAPRO Take 1 tablet (5 mg total) by mouth at bedtime.   ezetimibe 10 MG tablet Commonly known as: Zetia Take 1 tablet (10 mg total) by mouth daily.   lactulose 10 GM/15ML solution Commonly known as: CHRONULAC Take 30 mLs (20 g total) by mouth 2 (two) times daily as needed for mild constipation.   methylphenidate 5 MG tablet Commonly known as: RITALIN Take 1 tablet (5 mg total) by mouth 2 (two) times daily with breakfast and lunch.   pantoprazole sodium 40 mg/20 mL Pack Commonly known as: PROTONIX Take 20 mLs (40 mg total) by mouth at bedtime.   senna-docusate 8.6-50 MG tablet Commonly known as: Senokot-S Take 2 tablets by mouth 2 (two) times daily.   triamcinolone 0.1 % cream : eucerin Crea Apply 1 application topically 2 (two) times daily.       Contact information  for follow-up providers    Meredith Staggers, MD  Follow up.   Specialty: Physical Medicine and Rehabilitation Why: Office will call you with follow up appointment Contact information: [address] [place] [address] [phone]        Plotnikov, [name], MD. Call.   Specialty: Internal Medicine Why: for post hospital follow up Contact information: [place] [address] [phone]        Troy Sine, MD. Call.   Specialty: Cardiology Why: for routine check Contact information: [address] [place] [address] [phone]        [facility]. Call.   Specialty: Neurology Why: for stroke follow up Contact information: [place]           Contact information for after-discharge care    Destination    [facility] .   Service: Skilled Nursing Contact information: [address] [place] [phone]                  Signed: Bary Leriche 10/06/2019, 2:57 PM

## 2019-10-04 NOTE — Progress Notes (Signed)
Occupational Therapy Session Note  Patient Details  Name: David [name] MRN: [id number] Date of Birth: 09-25-1923  Today's Date: 10/04/2019  Session 1 OT Individual Time: 9326-7124 OT Individual Time Calculation (min): 43 min   Session 2 OT Individual Time: 5809-9833 OT Individual Time Calculation (min): 28 min    Short Term Goals: Week 4:  OT Short Term Goal 1 (Week 4): LTG=STG 2/2 ELOS  Skilled Therapeutic Interventions/Progress Updates: Session 1    Pt greeted semi-reclined in bed with daughter present and agreeable to OT treatment session. Pt came to sitting EOB with max A of 1. Sit<>stand in [place] with mod A, then transitioned into shower on BSC placed in shower. Pt perseverative on washing his face and needed hand over hand A and max cues to move onto next body part. Max A overall for bathing tasks. Stedy transfer out of shower with max A +2 for positioning of Stedy. Dressing completed from wc at the sink with mod A for UB dressing and total A for LB dressing 2/2 time management. Sit<>stand from wc with max+2 without AD to pull up pants. Pt combed hair at the sink and was left seated in wc with daughter present and needs met.    Session 2 Pt greeted seated in TIS wc with daughter present and agreeable to OT treatment session. Pt brought down to therapy gym and worked on [place] with weight bearing towel pushes at high-low table. OT provided joint input through elbow and wrist to help bring pt through shoulder/elbow/wrist ROM. Pt was able to activate some flexor synergy mostly with R shoulder adduction. Pt returned to room for handoff to SLP.    Therapy Documentation Precautions:  Precautions Precautions: Fall Precaution Comments: R hemi; R inattention Restrictions Weight Bearing Restrictions: No Pain:   denies pain  Therapy/Group: Individual Therapy  Valma Cava 10/04/2019, 12:25 PM

## 2019-10-04 NOTE — Progress Notes (Signed)
Physical Therapy Session Note  Patient Details  Name: David [name] MRN: [id number] Date of Birth: Aug 27, 1923  Today's Date: 10/04/2019 PT Individual Time: 0950-1105 PT Individual Time Calculation (min): 75 min   Short Term Goals: Week 1:  PT Short Term Goal 1 (Week 1): Pt will be able to perform bed mobility with mod assist PT Short Term Goal 1 - Progress (Week 1): Progressing toward goal PT Short Term Goal 2 (Week 1): Pt will be able to initiate transfers with max +2 PT Short Term Goal 2 - Progress (Week 1): Met PT Short Term Goal 3 (Week 1): Pt will be able to reorient to midline seated EOB with mod assist PT Short Term Goal 3 - Progress (Week 1): Met Week 2:  PT Short Term Goal 1 (Week 2): Patient will perfrom bed mobility with mod A consistently. PT Short Term Goal 1 - Progress (Week 2): Progressing toward goal PT Short Term Goal 2 (Week 2): Patient will perform transfers with mod A +2. PT Short Term Goal 2 - Progress (Week 2): Met PT Short Term Goal 3 (Week 2): Patient will perform sit to stand with mod A +2. PT Short Term Goal 3 - Progress (Week 2): Met PT Short Term Goal 4 (Week 2): Patient will initiate pre-gait activities. PT Short Term Goal 4 - Progress (Week 2): Met  Skilled Therapeutic Interventions/Progress Updates:    PAIN Denies pain Pt initially oob in wc w/daughter present, appears alert and awake, agreeable to treatment. Pt transported to gym for session. STS in parallel bars using mirror for feedback and engaged in standing balance/wt shifting task using LUE to cross midline to R to place clips from L to R w/return to midline. Significantly decreased tendency to push w/LUE noted from last session w/this therapist. Stood x 5 min for above, then required 5 min rest in sitting before next activity.  Pt transported to hall for gait training using hallway rail on L: Trial 1: STS w/mod assist, slow transition, cues for uprgith posture.   Gait 51f w/rail and max  assist to advance LLE thru swing, cues for posture, occasionally attempts second step on L prior to assisted step on R due to inattention.  Maintains midlline lateral posture well using rail, step to gait pattern, overall flexed posture.  540m rest  Trial 2, STS and gait as above x [id number] Trial 3: STS and gait as above x [id number] seating - obtained 18/18 wc and jay back which needs to be applied to chair before switching wcs.  Will continue w/this as time permits.    Transfer training -Pt educated about benefits/use of hemiwalker for transfers/gait.  initiated transfer training w/hemiwalker.  Performs wc to/from mat w/hemiwalker w/max assist, hand over hand assist to manage walker due to poor motor planning.  Would benefit from continued training as possible functional gait AD.  Pt transported back to room at end of session and daughter educated about achievements/progress this session.  Very good progress w/midline orientation and functional mobility.  Would benefit from intitiation of wc skills.    Therapy Documentation Precautions:  Precautions Precautions: Fall Precaution Comments: R hemi; R inattention Restrictions Weight Bearing Restrictions: No    Therapy/Group: Individual Therapy  BarbaCallie Fielding  [place]/2021, 12:40 PM

## 2019-10-04 NOTE — Plan of Care (Signed)
  Problem: RH Eating Goal: LTG Patient will perform eating w/assist, cues/equip (OT) Description: LTG: Patient will perform eating with assist, with/without cues using equipment (OT) Flowsheets (Taken 10/04/2019 1233) LTG: Pt will perform eating with assistance level of: Moderate Assistance - Patient 50 - 74% Note: Goal downgraded 5/21-ESD   Problem: RH Grooming Goal: LTG Patient will perform grooming w/assist,cues/equip (OT) Description: LTG: Patient will perform grooming with assist, with/without cues using equipment (OT) Flowsheets (Taken 10/04/2019 1233) LTG: Pt will perform grooming with assistance level of: Minimal Assistance - Patient > 75% Note: Goal downgraded 5/21-ESD

## 2019-10-04 NOTE — Progress Notes (Signed)
Pt had a low pulse at rest in bed. V/S reassessed immediately and pulse changed. No interventions in place due to this not being an acute change.

## 2019-10-04 NOTE — Progress Notes (Addendum)
[facility] PHYSICAL MEDICINE & REHABILITATION PROGRESS NOTE  Subjective/Complaints: No new issues today. Up in bed. Daughter feeding him.   ROS: Patient denies fever, rash, sore throat, blurred vision, nausea, vomiting, diarrhea, cough, shortness of breath or chest pain,   Objective: No results found. Recent Labs    10/04/19 0735  WBC 7.9  HGB 16.3  HCT 48.5  PLT 83*   Recent Labs    10/04/19 0735  NA 140  K 3.7  CL 104  CO2 28  GLUCOSE 102*  BUN 13  CREATININE 1.22  CALCIUM 9.2    Intake/Output Summary (Last 24 hours) at 10/04/2019 1113 Last data filed at 10/04/2019 0742 Gross per 24 hour  Intake 780 ml  Output --  Net 780 ml     Physical Exam: Vital Signs Blood pressure (!) 150/48, pulse (!) 51, temperature 98.3 F (36.8 C), resp. rate 19, height 5\' 11"  (1.803 m), weight 85.3 kg, SpO2 93 %. Constitutional: No distress . Vital signs reviewed. HEENT: EOMI, oral membranes moist Neck: supple Cardiovascular: RRR without murmur. No JVD    Respiratory/Chest: CTA Bilaterally without wheezes or rales. Normal effort    GI/Abdomen: BS +, non-tender, non-distended Ext: no clubbing, cyanosis, or edema Psych: more engaging Musc: No edema in extremities.  No tenderness in extremities. Neurological:   Speech clearer. Language still confused. Flexor tone RUE, extensor tone RLE still present. --> MAS 1 to 1+/4  Skin: numerous bruises/lacs on arms all stable.  Small area redness near sacrum/buttocks improving  Assessment/Plan: 1. Functional deficits secondary to left BG ICH which require 3+ hours per day of interdisciplinary therapy in a comprehensive inpatient rehab setting.  Physiatrist is providing close team supervision and 24 hour management of active medical problems listed below.  Physiatrist and rehab team continue to assess barriers to discharge/monitor patient progress toward functional and medical goals  Care Tool:  Bathing        Body parts bathed by  helper: Front perineal area, Buttocks     Bathing assist Assist Level: 2 Helpers     Upper Body Dressing/Undressing Upper body dressing Upper body dressing/undressing activity did not occur (including orthotics): N/A What is the patient wearing?: Hospital gown only    Upper body assist Assist Level: Dependent - Patient 0%    Lower Body Dressing/Undressing Lower body dressing      What is the patient wearing?: Incontinence brief, Pants     Lower body assist Assist for lower body dressing: Dependent - Patient 0%     Toileting Toileting    Toileting assist Assist for toileting: Total Assistance - Patient < 25%     Transfers Chair/bed transfer  Transfers assist  Chair/bed transfer activity did not occur: Safety/medical concerns  Chair/bed transfer assist level: Maximal Assistance - Patient 25 - 49% Chair/bed transfer assistive device: Armrests   Locomotion Ambulation   Ambulation assist   Ambulation activity did not occur: Safety/medical concerns  Assist level: Maximal Assistance - Patient 25 - 49% Assistive device: Other (comment)(L rail) Max distance: 15'   Walk 10 feet activity   Assist  Walk 10 feet activity did not occur: Safety/medical concerns  Assist level: Maximal Assistance - Patient 25 - 49% Assistive device: Other (comment)(L rail)   Walk 50 feet activity   Assist Walk 50 feet with 2 turns activity did not occur: Safety/medical concerns         Walk 150 feet activity   Assist Walk 150 feet activity did not occur: Safety/medical concerns  Walk 10 feet on uneven surface  activity   Assist Walk 10 feet on uneven surfaces activity did not occur: Safety/medical concerns         Wheelchair     Assist Will patient use wheelchair at discharge?: Yes Type of Wheelchair: Manual Wheelchair activity did not occur: Safety/medical concerns(lethargy; TIS would be dependent)  Wheelchair assist level: Moderate Assistance -  Patient 50 - 74% Max wheelchair distance: 57'    Wheelchair 50 feet with 2 turns activity    Assist            Wheelchair 150 feet activity     Assist          Blood pressure (!) 150/48, pulse (!) 51, temperature 98.3 F (36.8 C), resp. rate 19, height 5\' 11"  (1.803 m), weight 85.3 kg, SpO2 93 %.  Medical Problem List and Plan: 1.Functional, mobility and cognitive deficitssecondary to left basal ganglia hemorrhage  -Continue CIR PT, OT, SLP   -therapy intensity increased back to 3hr per day  -spastic right hemiparesis: Not a candidate d/t lethargy for oral antispasmodics. ?botox as outpt   -PRAFO/AFO, ROM with therapy/family       -arousal still can wax/wane: recent CT of head was stable to improved in appearance.   -SNF now pending potentially for Monday 10/06/19 2. Antithrombotics: -DVT/anticoagulation:Mechanical:Sequential compression devices, below kneeBilateral lower extremities--platelets stable to increased 90k -antiplatelet therapy: N/A 3.Chronic LBP/Pain Management:Tylenol prn.well controlled 4. Mood:LCSW to follow for evaluation and support.  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 Neuropsych: This patientis notcapable of making decisions on hisown behalf.  -  Amantadine increased to 100mg  bid    -ritalin started 5/11 at 2.5mg , increase to 5mg  bid 5/13. Bp/hr ok   -continue   5/20 reduced amantadine to daily --> observe 6. Skin/Wound Care:Routine pressure relief measures. 7. Fluids/Electrolytes/Nutrition:resumed po diet   5/21 pt off IVF since Monday. BUN/Cr holding steady.    -recheck once more Monday  8. HTN:  SBP goal<140--added prn catapres  -resumed low dose tenormin 5/5--> DBP still elevated. Increased to 25mg   Controlled on 5/21, HR in 40's to 50's at time-->prn hold parameters for tenormin  9.H/oCOPD:Stable without meds. 10. Impaired glucose  tolerance:CBG's wnl so far.  - SSI as indicated.  5/21 controlled 11. CAD/AVR:Off ASA due to bleed. No statin due to intolerance. Tenormin, cozaar and lasix (used prn) on hold.  -no TEE indicated 12. Thrombocytopenia:   Platelets decreased to 122 on 5/11.  - further drop to 83k today--has been as low as 73k during this admit  -recheck once more 5/22 to make sure there's not a further downward trend 13. AKI: Creatinine ranging from 1.11 --1.22   14.  Post stroke dysphagia:               -aspiration precautions  - pharyngeal dysphagia.    -diet changed to D1/nectars--upgrade soon to D2? 15. Constipation: Senna at bedtime   5/18---sorbitol 60cc today, SSE if needed  5/20 had bm's yesterday    -added scheduled miralax to daily regimen  LOS: 25 days A FACE TO FACE EVALUATION WAS PERFORMED  Meredith Staggers 10/04/2019, 11:13 AM

## 2019-10-04 NOTE — Progress Notes (Signed)
Speech Language Pathology Weekly Progress and Session Note  Patient Details  Name: David MCBRIEN MRN: [id number] Date of Birth: May 03, 1924  Beginning of progress report period: Sep 26, 2019 End of progress report period: Oct 03, 2019  Today's Date: 10/04/2019 SLP Individual Time: 1200-1300 SLP Individual Time Calculation (min): 60 min  Short Term Goals: Week 3: SLP Short Term Goal 1 (Week 3): Patient will utilize speech intelligibility strategies at the phrase level to achieve ~90% intelligibility with Mod verbal cues. SLP Short Term Goal 1 - Progress (Week 3): Met SLP Short Term Goal 2 (Week 3): Patient will verbalize wants/needs at the sentence level with Mod verbal cues. SLP Short Term Goal 2 - Progress (Week 3): Met SLP Short Term Goal 3 (Week 3): Patient will demonstrate sustained attention to functional tasks for 30 minutes with Mod verbal cues for redirection. SLP Short Term Goal 3 - Progress (Week 3): Met SLP Short Term Goal 4 (Week 3): Patient will demonstrate orientation to place, time and situation with Mod A multimodal cues. SLP Short Term Goal 4 - Progress (Week 3): Met SLP Short Term Goal 5 (Week 3): Patient will consume trials of ice chips with minimal overt s/s of aspiration over 2 sessions with Min verbal cues to assess readiness for repeat MBS SLP Short Term Goal 5 - Progress (Week 3): Met SLP Short Term Goal 6 (Week 3): Patient will demonstrate efficient mastication and complete oral clearance with trials of Dys. 2 textures without overt s/s of aspiration over 2 sessions prior to upgrade. SLP Short Term Goal 6 - Progress (Week 3): Met    New Short Term Goals: Week 4: SLP Short Term Goal 1 (Week 4): STGs=LTGs due to ELOS  Weekly Progress Updates: Patient has made excellent gains and has met 6 of 6 STGs this reporting period. Currently, patient is consuming Dys. 2 textures with nectar-thick liquids with minimal overt s/s of aspiration and overall Min A verbal cues for  use of swallowing compensatory strategies. Patient also demonstrates improved speech intelligibility and is overall ~90% intelligible at the phrase and sentence level with Min verbal cues.  Patient demonstrates improved sustained attention to tasks and recall of daily information regarding orientation information.  Patient and family education ongoing. Patient would benefit from continued skilled SLP intervention to maximize his cognitive, swallowing and speech function prior to discharge.     Intensity: Minumum of 1-2 x/day, 30 to 90 minutes Frequency: 3 to 5 out of 7 days Duration/Length of Stay: 5/25 Treatment/Interventions: Cognitive remediation/compensation;Internal/external aids;Speech/Language facilitation;Environmental Environmental consultant;Therapeutic Activities;Patient/family education;Functional tasks;Dysphagia/aspiration precaution training   Daily Session  Skilled Therapeutic Interventions: Skilled treatment session focused on dysphagia and speech goals. SLP facilitated session by providing skilled observation with trial tray of Dys. 2 textures with nectar-thick liquids. Patient demonstrated mildly prolonged mastication but independently utilized a liquid wash to help clear mild oral residue. Patient did not demonstrate any overt s/s of aspiration but had decreased PO intake secondary to difficulty with chewing and amount of "energy" needed per patient report.  However, based on observation from SLP, patient appeared to tolerate the upgrade from an energy conservation standpoint well. Due to minimal oral residue without overt s/s of aspiration, discussed with the patient and his daughter the plan to try another meal prior to downgrade to Dys. 1 with focus on maximizing patient's overall comfort level with upgrade. Both in agreement. Overall, patient was ~90% intelligible at the sentence level with Min verbal cues for a slow rate and increased  vocal intensity. Patient was transferred back  to bed at end of session via the [facility] with +2 assist for safety. Patient left upright in bed with alarm on and daughter present. Continue with current plan of care.      Pain No/Denies Pain   Therapy/Group: Individual Therapy   Tiffony Kite 10/04/2019, 2:21 PM

## 2019-10-04 NOTE — Plan of Care (Signed)
Problem: Consults Goal: RH GENERAL PATIENT EDUCATION Description: See Patient Education module for education specifics. 10/04/2019 1240 by Rodolph Bong, LPN Outcome: Progressing 10/04/2019 1240 by Rodolph Bong, LPN Outcome: Progressing Goal: Skin Care Protocol Initiated - if Braden Score 18 or less Description: Patients skin will be free from break down 10/04/2019 1240 by Rodolph Bong, LPN Outcome: Progressing 10/04/2019 1240 by Rodolph Bong, LPN Outcome: Progressing Goal: Nutrition Consult-if indicated 10/04/2019 1240 by Rodolph Bong, LPN Outcome: Progressing 10/04/2019 1240 by Rodolph Bong, LPN Outcome: Progressing Goal: Diabetes Guidelines if Diabetic/Glucose > 140 Description: If diabetic or lab glucose is > 140 mg/dl - Initiate Diabetes/Hyperglycemia Guidelines & Document Interventions  10/04/2019 1240 by Rodolph Bong, LPN Outcome: Progressing 10/04/2019 1240 by Rodolph Bong, LPN Outcome: Progressing   Problem: RH BOWEL ELIMINATION Goal: RH STG MANAGE BOWEL WITH ASSISTANCE Description: STG Manage Bowel with mod I Assistance. 10/04/2019 1240 by Rodolph Bong, LPN Outcome: Progressing 10/04/2019 1240 by Rodolph Bong, LPN Outcome: Progressing Goal: RH STG MANAGE BOWEL W/MEDICATION W/ASSISTANCE Description: STG Manage Bowel with Medication with mod I Assistance. 10/04/2019 1240 by Rodolph Bong, LPN Outcome: Progressing 10/04/2019 1240 by Rodolph Bong, LPN Outcome: Progressing   Problem: RH BLADDER ELIMINATION Goal: RH STG MANAGE BLADDER WITH ASSISTANCE Description: STG Manage Bladder With mod I Assistance 10/04/2019 1240 by Rodolph Bong, LPN Outcome: Progressing 10/04/2019 1240 by Rodolph Bong, LPN Outcome: Progressing   Problem: RH SKIN INTEGRITY Goal: RH STG SKIN FREE OF INFECTION/BREAKDOWN Description: Patient will be free from skin break down 10/04/2019 1240 by Rodolph Bong, LPN Outcome: Progressing 10/04/2019 1240 by Rodolph Bong,  LPN Outcome: Progressing Goal: RH STG MAINTAIN SKIN INTEGRITY WITH ASSISTANCE Description: STG Maintain Skin Integrity With mod I Assistance. 10/04/2019 1240 by Rodolph Bong, LPN Outcome: Progressing 10/04/2019 1240 by Rodolph Bong, LPN Outcome: Progressing Goal: RH STG ABLE TO PERFORM INCISION/WOUND CARE W/ASSISTANCE Description: STG Able To Perform Incision/Wound Care With mod I Assistance. 10/04/2019 1240 by Rodolph Bong, LPN Outcome: Progressing 10/04/2019 1240 by Rodolph Bong, LPN Outcome: Progressing   Problem: RH SAFETY Goal: RH STG ADHERE TO SAFETY PRECAUTIONS W/ASSISTANCE/DEVICE Description: STG Adhere to Safety Precautions With Assistance/Device. 10/04/2019 1240 by Rodolph Bong, LPN Outcome: Progressing 10/04/2019 1240 by Rodolph Bong, LPN Outcome: Progressing   Problem: RH PAIN MANAGEMENT Goal: RH STG PAIN MANAGED AT OR BELOW PT'S PAIN GOAL Description: Pain scale <3/10 10/04/2019 1240 by Rodolph Bong, LPN Outcome: Progressing 10/04/2019 1240 by Rodolph Bong, LPN Outcome: Progressing   Problem: RH KNOWLEDGE DEFICIT GENERAL Goal: RH STG INCREASE KNOWLEDGE OF SELF CARE AFTER HOSPITALIZATION 10/04/2019 1240 by Rodolph Bong, LPN Outcome: Progressing 10/04/2019 1240 by Rodolph Bong, LPN Outcome: Progressing   Problem: Consults Goal: RH STROKE PATIENT EDUCATION Description: See Patient Education module for education specifics  10/04/2019 1240 by Rodolph Bong, LPN Outcome: Progressing 10/04/2019 1240 by Rodolph Bong, LPN Outcome: Progressing   Problem: RH KNOWLEDGE DEFICIT Goal: RH STG INCREASE KNOWLEDGE OF HYPERTENSION Description: Patient will be able to verbalize anti-hypertensive medication & side effects by discharge 10/04/2019 1240 by Rodolph Bong, LPN Outcome: Progressing 10/04/2019 1240 by Rodolph Bong, LPN Outcome: Progressing Goal: RH STG INCREASE KNOWLEDGE OF DYSPHAGIA/FLUID INTAKE Description: Patient will be able to verbalize need  for specialty diet 10/04/2019 1240 by Rodolph Bong, LPN Outcome: Progressing 10/04/2019 1240 by Rodolph Bong, LPN Outcome: Progressing Goal: RH STG INCREASE  KNOWLEDGE OF STROKE PROPHYLAXIS Description: Patient will be able to verbalize medications that are for stroke prophylaxis 10/04/2019 1240 by Rodolph Bong, LPN Outcome: Progressing 10/04/2019 1240 by Rodolph Bong, LPN Outcome: Progressing   Problem: Consults Goal: RH STROKE PATIENT EDUCATION Description: See Patient Education module for education specifics  10/04/2019 1240 by Rodolph Bong, LPN Outcome: Progressing 10/04/2019 1240 by Rodolph Bong, LPN Outcome: Progressing

## 2019-10-05 ENCOUNTER — Inpatient Hospital Stay (HOSPITAL_COMMUNITY): Payer: Medicare Other

## 2019-10-05 ENCOUNTER — Inpatient Hospital Stay (HOSPITAL_COMMUNITY): Payer: Medicare Other | Admitting: Speech Pathology

## 2019-10-05 LAB — CBC
HCT: 46.2 % (ref 39.0–52.0)
Hemoglobin: 15.4 g/dL (ref 13.0–17.0)
MCH: 31.2 pg (ref 26.0–34.0)
MCHC: 33.3 g/dL (ref 30.0–36.0)
MCV: 93.5 fL (ref 80.0–100.0)
Platelets: 83 10*3/uL — ABNORMAL LOW (ref 150–400)
RBC: 4.94 MIL/uL (ref 4.22–5.81)
RDW: 13.9 % (ref 11.5–15.5)
WBC: 7.3 10*3/uL (ref 4.0–10.5)
nRBC: 0 % (ref 0.0–0.2)

## 2019-10-05 LAB — GLUCOSE, CAPILLARY
Glucose-Capillary: 99 mg/dL (ref 70–99)
Glucose-Capillary: 116 mg/dL — ABNORMAL HIGH (ref 70–99)
Glucose-Capillary: 120 mg/dL — ABNORMAL HIGH (ref 70–99)
Glucose-Capillary: 131 mg/dL — ABNORMAL HIGH (ref 70–99)

## 2019-10-05 NOTE — Progress Notes (Signed)
Speech Language Pathology Daily Session Note  Patient Details  Name: David [name] MRN: [id number] Date of Birth: Sep 23, 1923  Today's Date: 10/05/2019 SLP Individual Time: 0925-1005 SLP Individual Time Calculation (min): 40 min  Short Term Goals: Week 4: SLP Short Term Goal 1 (Week 4): STGs=LTGs due to ELOS  Skilled Therapeutic Interventions: Skilled treatment session focused on cognitive-linguistic goals. SLP facilitated session by providing Min A verbal cues for orientation to place and date. Patient answered basic yes/no questions with 80% accuracy and complex yes/no questions with 40% accuracy. Patient followed 1-step commands with 100% accuracy and 2-step commands with 60% accuracy. Patient also named functional items and performed responsive naming tasks with 100% accuracy. However, multiple repetitions and Mod verbal and tactile cues were needed for attention to task with intermittent perseveration on topics. Patient left upright in bed with alarm on and daughter present. Continue with current plan of care.      Pain No/Denies Pain  Therapy/Group: Individual Therapy  Keyra Virella 10/05/2019, 10:06 AM

## 2019-10-05 NOTE — Progress Notes (Signed)
[facility] PHYSICAL MEDICINE & REHABILITATION PROGRESS NOTE  Subjective/Complaints:   Pt reports not "hurting much"- lost voice- is gravely- bothering him but not painful.   Feeling SOB esp when just wakes up or is tired.  LBM yesterday.     ROS:  Pt denies SOB, abd pain, CP, N/V/C/D, and vision changes    Objective: DG CHEST PORT 1 VIEW  Result Date: 10/05/2019 CLINICAL DATA:  Shortness of breath EXAM: PORTABLE CHEST 1 VIEW COMPARISON:  09/13/2019 FINDINGS: Cardiac shadow is within normal limits. Postsurgical changes are noted. No focal infiltrate or sizable effusion is seen. No bony abnormality is noted. IMPRESSION: No active disease. Electronically Signed   By: Inez Catalina M.D.   On: 10/05/2019 14:39   Recent Labs    10/04/19 0735 10/05/19 0545  WBC 7.9 7.3  HGB 16.3 15.4  HCT 48.5 46.2  PLT 83* 83*   Recent Labs    10/04/19 0735  NA 140  K 3.7  CL 104  CO2 28  GLUCOSE 102*  BUN 13  CREATININE 1.22  CALCIUM 9.2    Intake/Output Summary (Last 24 hours) at 10/05/2019 1542 Last data filed at 10/05/2019 0800 Gross per 24 hour  Intake 520 ml  Output --  Net 520 ml     Physical Exam: Vital Signs Blood pressure (!) 150/78, pulse 68, temperature 98 F (36.7 C), temperature source Oral, resp. rate 17, height 5\' 11"  (1.803 m), weight 88.6 kg, SpO2 94 %. Constitutional: No distress . Vital signs reviewed. Sitting up in bed; daughter at bedside; staring into room, NAD HEENT: EOMI, oral membranes moist Neck: supple Cardiovascular: RRR< no MR/G, no JVD    Respiratory/Chest: good air movement- lungs clear overall, but decreased air movement at bases slightly- no W/R/R    GI/Abdomen: Soft, NT, ND, (+)BS  bing, cyanosis, or edema Psych:flat affect- daughter talked a lot for him Musc: No edema in extremities.  No tenderness in extremities. Neurological:   Speech clearer. Language still confused. Flexor tone RUE, extensor tone RLE still present. --> MAS 1 to 1+/4   Skin: numerous bruises/lacs on arms all stable.  Small area redness near sacrum/buttocks improving  Assessment/Plan: 1. Functional deficits secondary to left BG ICH which require 3+ hours per day of interdisciplinary therapy in a comprehensive inpatient rehab setting.  Physiatrist is providing close team supervision and 24 hour management of active medical problems listed below.  Physiatrist and rehab team continue to assess barriers to discharge/monitor patient progress toward functional and medical goals  Care Tool:  Bathing        Body parts bathed by helper: Front perineal area, Buttocks     Bathing assist Assist Level: 2 Helpers     Upper Body Dressing/Undressing Upper body dressing Upper body dressing/undressing activity did not occur (including orthotics): N/A What is the patient wearing?: Hospital gown only    Upper body assist Assist Level: Dependent - Patient 0%    Lower Body Dressing/Undressing Lower body dressing      What is the patient wearing?: Incontinence brief, Pants     Lower body assist Assist for lower body dressing: Dependent - Patient 0%     Toileting Toileting    Toileting assist Assist for toileting: Total Assistance - Patient < 25%     Transfers Chair/bed transfer  Transfers assist  Chair/bed transfer activity did not occur: Safety/medical concerns  Chair/bed transfer assist level: Maximal Assistance - Patient 25 - 49% Chair/bed transfer assistive device: Walker(hemiwalker)   Locomotion  Ambulation   Ambulation assist   Ambulation activity did not occur: Safety/medical concerns  Assist level: Moderate Assistance - Patient 50 - 74% Assistive device: Other (comment)(l handrail) Max distance: 22   Walk 10 feet activity   Assist  Walk 10 feet activity did not occur: Safety/medical concerns  Assist level: Moderate Assistance - Patient - 50 - 74% Assistive device: (handrail)   Walk 50 feet activity   Assist Walk 50 feet  with 2 turns activity did not occur: Safety/medical concerns         Walk 150 feet activity   Assist Walk 150 feet activity did not occur: Safety/medical concerns         Walk 10 feet on uneven surface  activity   Assist Walk 10 feet on uneven surfaces activity did not occur: Safety/medical concerns         Wheelchair     Assist Will patient use wheelchair at discharge?: Yes Type of Wheelchair: Manual Wheelchair activity did not occur: Safety/medical concerns(lethargy; TIS would be dependent)  Wheelchair assist level: Moderate Assistance - Patient 50 - 74% Max wheelchair distance: 31'    Wheelchair 50 feet with 2 turns activity    Assist            Wheelchair 150 feet activity     Assist          Blood pressure (!) 150/78, pulse 68, temperature 98 F (36.7 C), temperature source Oral, resp. rate 17, height 5\' 11"  (1.803 m), weight 88.6 kg, SpO2 94 %.  Medical Problem List and Plan: 1.Functional, mobility and cognitive deficitssecondary to left basal ganglia hemorrhage  -Continue CIR PT, OT, SLP   -therapy intensity increased back to 3hr per day  -spastic right hemiparesis: Not a candidate d/t lethargy for oral antispasmodics. ?botox as outpt   -PRAFO/AFO, ROM with therapy/family       -arousal still can wax/wane: recent CT of head was stable to improved in appearance.   -SNF now pending potentially for Monday 10/06/19 2. Antithrombotics: -DVT/anticoagulation:Mechanical:Sequential compression devices, below kneeBilateral lower extremities--platelets stable to increased 90k -antiplatelet therapy: N/A 3.Chronic LBP/Pain Management:Tylenol prn.well controlled 4. Mood:LCSW to follow for evaluation and support.  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 Neuropsych: This patientis notcapable of making decisions on hisown behalf.  -  Amantadine increased to  100mg  bid    -ritalin started 5/11 at 2.5mg , increase to 5mg  bid 5/13. Bp/hr ok   -continue   5/20 reduced amantadine to daily --> observe 6. Skin/Wound Care:Routine pressure relief measures. 7. Fluids/Electrolytes/Nutrition:resumed po diet   5/21 pt off IVF since Monday. BUN/Cr holding steady.    -recheck once more Monday  8. HTN:  SBP goal<140--added prn catapres  -resumed low dose tenormin 5/5--> DBP still elevated. Increased to 25mg   Controlled on 5/21, HR in 40's to 50's at time-->prn hold parameters for tenormin  9.H/oCOPD:Stable without meds. 10. Impaired glucose tolerance:CBG's wnl so far.  - SSI as indicated.  5/21 controlled 11. CAD/AVR:Off ASA due to bleed. No statin due to intolerance. Tenormin, cozaar and lasix (used prn) on hold.  -no TEE indicated 12. Thrombocytopenia:   Platelets decreased to 122 on 5/11.  - further drop to 83k today--has been as low as 73k during this admit  -recheck once more 5/22 to make sure there's not a further downward trend  5/22- Plts stable 13. AKI: Creatinine ranging from 1.11 --1.22   14.  Post stroke dysphagia:               -  aspiration precautions  - pharyngeal dysphagia.    -diet changed to D1/nectars--upgrade soon to D2? 15. Constipation: Senna at bedtime   5/18---sorbitol 60cc today, SSE if needed  5/20 had bm's yesterday    -added scheduled miralax to daily regimen 16. SOB  5/22- Pt's CXR is completely Clear- think it could be due to taking shallow breaths based on exam.  17. Dispo  5/22- d/c Monday  LOS: 26 days A FACE TO FACE EVALUATION WAS PERFORMED  David [name] 10/05/2019, 3:42 PM

## 2019-10-06 ENCOUNTER — Inpatient Hospital Stay (HOSPITAL_COMMUNITY): Payer: Medicare Other | Admitting: Speech Pathology

## 2019-10-06 ENCOUNTER — Inpatient Hospital Stay (HOSPITAL_COMMUNITY): Payer: Medicare Other

## 2019-10-06 LAB — GLUCOSE, CAPILLARY
Glucose-Capillary: 110 mg/dL — ABNORMAL HIGH (ref 70–99)
Glucose-Capillary: 131 mg/dL — ABNORMAL HIGH (ref 70–99)
Glucose-Capillary: 139 mg/dL — ABNORMAL HIGH (ref 70–99)
Glucose-Capillary: 139 mg/dL — ABNORMAL HIGH (ref 70–99)

## 2019-10-06 LAB — SARS CORONAVIRUS 2 (TAT 6-24 HRS): SARS Coronavirus 2: NEGATIVE

## 2019-10-06 NOTE — Progress Notes (Signed)
Physical Therapy Discharge Summary  Patient Details  Name: David [name] MRN: [id number] Date of Birth: 1923/11/25  Today's Date: 10/07/2019 PT Individual Time: 8546-2703 PT Individual Time Calculation: 45 min  Patient has met 0 of 9 long term goals due to improved activity tolerance, improved balance, improved postural control, increased strength, increased range of motion, ability to compensate for deficits, improved attention, improved awareness and improved coordination.  Patient to discharge at a wheelchair level [place].   Patient's care partner unable to provide the necessary physical and cognitive assistance at discharge.  Reasons goals not met: Patient with slow progress due to lethargy early during patient's stay and fluctuating activity tolerance and mobility due to increased tone, fatigue, and constipation throughout stay. Plan to d/c SNF for improved mobility prior to patient returning home.   Recommendation:  Patient will benefit from ongoing skilled PT services in skilled nursing facility setting to continue to advance safe functional mobility, address ongoing impairments in balance, strength, midline orientation, R UE/LE hypotonia and hemiplegia, attention, functional mobility, activity tolerance, gait, patient/caregiver education, and minimize fall risk.  Equipment: No equipment provided  Reasons for discharge: discharge from hospital to SNF  Patient/family agrees with progress made and goals achieved: Yes  Skilled Therapeutic Intervention: Patient in bed with his daughter, Narda Rutherford, in the room upon PT arrival. Patient alert and agreeable to PT session. Patient denied pain during session and displayed no signs of pain throughout session. Patient reported that he did not sleep well due to being incontinent of bladder in the bed and unable to call for help. Patient's daughter reported that he was able to go to the bathroom using the [facility] with nursing staff yesterday and was  continent of bladder throughout the day, explaining the patient's distress about being incontinent over night. Educated patient and his daughter about communicating with SNF staff about maintaining continence and finding a way for him to call for assistance. Patient asked to remain in bed due to fatigue. Focused session on discharge assessment and patient/caregiver education.  Patient expressed concern about his inability to move his R UE and LE. Reminded patient of volitional movement that started last week. Reviewed patient's progress with R side motor control and functional mobility with patient and his daughter and discussed goals for PT at next level of care. Encouraged patient's daughter to discuss home modifications, such as a hospital bed and use of lift with staff throughout stay to prepare for patient's needs upon d/c from SNF. Encouraged patient to continue demonstrating the same level of motivation as he has while in CIR. Reviewed UE and LE positioning and ROM exercises with patient's daughter, she was able to teach back techniques learned in previous sessions.   Patient in bed with his daughter in the room at end of session with breaks locked, bed alarm set, and all needs within reach.    PT Discharge Precautions/Restrictions Precautions Precautions: Fall Precaution Comments: R hemiplegia Restrictions Weight Bearing Restrictions: No Vision/Perception  Perception Inattention/Neglect: Does not attend to right visual field(required increased cuing to attend to R visual field, but does look right with cues and functionally, has R peripheral vision) Praxis Praxis Impairment Details: Motor planning(much improved since admission)  Cognition Overall Cognitive Status: Impaired/Different from baseline Arousal/Alertness: Lethargic Orientation Level: Oriented X4 Sustained Attention: Impaired Sustained Attention Impairment: Functional basic;Verbal basic Memory: Impaired Memory Impairment:  Decreased short term memory;Decreased recall of new information Decreased Short Term Memory: Verbal basic;Functional basic Awareness: Impaired Awareness Impairment: Emergent impairment Problem Solving:  Impaired Problem Solving Impairment: Verbal basic;Functional basic Safety/Judgment: Impaired Sensation Sensation Light Touch: Impaired by gross assessment Proprioception: Impaired by gross assessment Additional Comments: reports that he does not feel anything on his R side, no response to light touch or painful stimulus Coordination Gross Motor Movements are Fluid and Coordinated: No Fine Motor Movements are Fluid and Coordinated: No Coordination and Movement Description: R hemi Motor  Motor Motor: Hemiplegia;Abnormal tone;Abnormal postural alignment and control Motor - Skilled Clinical Observations: R lean, decresed orientation to midline; impaired trunk control Motor - Discharge Observations: Improved midline orientation >75% of the time, R UE and LE motor activation improving  Mobility Bed Mobility Rolling Right: Supervision/verbal cueing(with use of bed rail) Rolling Left: Minimal Assistance - Patient > 75%(with use of bed rail) Supine to Sit: Moderate Assistance - Patient 50-74% Sit to Supine: Moderate Assistance - Patient 50-74% Transfers Transfers: Sit to Stand;Stand to Sit;Lateral/Scoot Transfers;Stand Pivot Transfers Sit to Stand: Moderate Assistance - Patient 50-74%(with L rail) Stand to Sit: Moderate Assistance - Patient 50-74%(with L rail) Stand Pivot Transfers: Maximal Assistance - Patient 25 - 49%(holding bed rail) Stand Pivot Transfer Details: Manual facilitation for placement Stand Pivot Transfer Details (indicate cue type and reason): facilitation for R LE placement during pivot Lateral/Scoot Transfers: Moderate Assistance - Patient 50-74%(to the L max +1-2 to the R) Locomotion  Gait Ambulation: Yes Gait Assistance: Maximal Assistance - Patient 25-49% Gait  Distance (Feet): 30 Feet(20-30 feet average) Assistive device: Other (Comment)(L rail) Gait Assistance Details: Manual facilitation for placement;Manual facilitation for weight shifting;Manual facilitation for weight bearing;Verbal cues for gait pattern;Verbal cues for sequencing Gait Assistance Details: Requires max A for R LE advancement Gait Gait: Yes Gait Pattern: Step-to pattern;Decreased stance time - right;Decreased step length - left;Decreased hip/knee flexion - right;Decreased dorsiflexion - right;Decreased weight shift to right;Right foot flat;Left foot flat;Right flexed knee in stance;Left flexed knee in stance;Decreased trunk rotation;Trunk rotated posteriorly on right;Trunk flexed;Narrow base of support;Poor foot clearance - right Gait velocity: decreased Stairs / Additional Locomotion Stairs: No Wheelchair Mobility Wheelchair Mobility: Yes Wheelchair Assistance: Total Assistance - Patient <25% Wheelchair Propulsion: Left upper extremity;Left lower extremity Wheelchair Parts Management: Needs assistance  Trunk/Postural Assessment  Cervical Assessment Cervical Assessment: Exceptions to WFL(forward head) Thoracic Assessment Thoracic Assessment: Exceptions to WFL(kyphosis) Lumbar Assessment Lumbar Assessment: Exceptions to WFL(posterior pelvic tilt) Postural Control Postural Control: Deficits on evaluation Trunk Control: impaired; tendency for posterior or lateral lean to R (improved since admission) Righting Reactions: delayed and indequate Protective Responses: delayed and inadequate  Balance Static Sitting Balance Static Sitting - Level of Assistance: 5: Stand by assistance(Min A initialy, quickly progresses to SBA) Dynamic Sitting Balance Dynamic Sitting - Level of Assistance: 4: Min assist Static Standing Balance Static Standing - Level of Assistance: 3: Mod assist(with L rail) Dynamic Standing Balance Dynamic Standing - Level of Assistance: 3: Mod assist(with L  rail) Extremity Assessment  RUE Assessment RUE Assessment: Exceptions to [facility] General Strength Comments: Pt with some thumb and first finger activation, also demonstrated some flexor synegy patterns but inconsistent turning it on RUE Body System: Neuro Brunstrum levels for arm and hand: Arm;Hand Brunstrum level for arm: Stage II Synergy is developing Brunstrum level for hand: Stage II Synergy is developing LUE Assessment LUE Assessment: Within Functional Limits RLE Assessment RLE Assessment: Exceptions to [facility] Passive Range of Motion (PROM) Comments: limited hip flexion ~100 degrees in sitting and DF to neutral (as tone allows) Active Range of Motion (AROM) Comments: minimal motor activation, able to perform heel slide  in bed to ~25 degrees knee flexion, adduction/abduction in hoo-lying ~10-15 degrees, PF in synergistic pattern General Strength Comments: Grossly 0-2-/5 throughout, intermittent volitional and spontaneous motor control RLE Tone RLE Tone Comments: Variable hypotonia throughout LLE Assessment LLE Assessment: Within Functional Limits Active Range of Motion (AROM) Comments: WFL for functional mobility General Strength Comments: WFL for functional mobility     L  PT, DPT  10/07/2019, 4:47 PM

## 2019-10-06 NOTE — Progress Notes (Signed)
Speech Language Pathology Daily Session Note  Patient Details  Name: David [name] MRN: [id number] Date of Birth: 1923/09/19  Today's Date: 10/06/2019 SLP Individual Time: [id number] SLP Individual Time Calculation (min): 31 min  Short Term Goals: Week 4: SLP Short Term Goal 1 (Week 4): STGs=LTGs due to ELOS  Skilled Therapeutic Interventions: Pt was seen for skilled ST targeting dysphagia and education with pt's family (2 daughters). With extra time, set up assist, and Min A verbal cueing for thoroughness, pt performed oral care via suction toothbrush. He then consumed ~15 ice chips with no overt s/sx aspiration noted. Reviewed water/ice chip protocol with pt's daughters and also answered questions regarding comprehension and carry over of information, given pt's current cognitive status. Focused on how fluctuations in attentions and distractions can further challenge carryover of information. Also answered family's questions regarding follow up care/therapy. Pt was fatigued after PO intake and ultimately session ended 15 minutes early, given that family had no further questions at this time. Pt left sitting in bed with alarm set and needs within reach, family still present. Continue per current plan of care.        Pain Pain Assessment Pain Scale: 0-10 Pain Score: 0-No pain  Therapy/Group: Individual Therapy  Arbutus Leas 10/06/2019, 7:25 AM

## 2019-10-06 NOTE — Progress Notes (Signed)
[facility] PHYSICAL MEDICINE & REHABILITATION PROGRESS NOTE  Subjective/Complaints:   Pt just went to bathroom and had BM- per daughter- is "tired out".  Trying to sleep- did drink shake, but didn't eat much breakfast.   Ready for d/c tomorrow to SNF.   ROS:   Pt denies SOB, abd pain, CP, N/V/C/D, and vision changes     Objective: DG CHEST PORT 1 VIEW  Result Date: 10/05/2019 CLINICAL DATA:  Shortness of breath EXAM: PORTABLE CHEST 1 VIEW COMPARISON:  09/13/2019 FINDINGS: Cardiac shadow is within normal limits. Postsurgical changes are noted. No focal infiltrate or sizable effusion is seen. No bony abnormality is noted. IMPRESSION: No active disease. Electronically Signed   By: Inez Catalina M.D.   On: 10/05/2019 14:39   Recent Labs    10/04/19 0735 10/05/19 0545  WBC 7.9 7.3  HGB 16.3 15.4  HCT 48.5 46.2  PLT 83* 83*   Recent Labs    10/04/19 0735  NA 140  K 3.7  CL 104  CO2 28  GLUCOSE 102*  BUN 13  CREATININE 1.22  CALCIUM 9.2    Intake/Output Summary (Last 24 hours) at 10/06/2019 1400 Last data filed at 10/06/2019 0806 Gross per 24 hour  Intake 540 ml  Output --  Net 540 ml     Physical Exam: Vital Signs Blood pressure (!) 157/74, pulse 63, temperature 97.9 F (36.6 C), temperature source Oral, resp. rate 18, height 5\' 11"  (1.803 m), weight 89.9 kg, SpO2 96 %. Constitutional: laying supine, eyes closed but opened to verbal stimuli, daughter in room, NAD HEENT: EOMI, oral membranes moist Neck: supple Cardiovascular: RRR    Respiratory/Chest: CTA B/L no W/R/R today    GI/Abdomen: Soft, NT, ND, (+)BS  bing, cyanosis, or edema Psych:flat affect; no spontnaeous speech today- daughter spoke Musc: No edema in extremities.  No tenderness in extremities. Neurological:   Speech clearer. Language still confused. Flexor tone RUE, extensor tone RLE still present. --> MAS 1 to 1+/4  Skin: numerous bruises/lacs on arms all stable.  Small area redness near  sacrum/buttocks improving  Assessment/Plan: 1. Functional deficits secondary to left BG ICH which require 3+ hours per day of interdisciplinary therapy in a comprehensive inpatient rehab setting.  Physiatrist is providing close team supervision and 24 hour management of active medical problems listed below.  Physiatrist and rehab team continue to assess barriers to discharge/monitor patient progress toward functional and medical goals  Care Tool:  Bathing        Body parts bathed by helper: Front perineal area, Buttocks     Bathing assist Assist Level: 2 Helpers     Upper Body Dressing/Undressing Upper body dressing Upper body dressing/undressing activity did not occur (including orthotics): N/A What is the patient wearing?: Hospital gown only    Upper body assist Assist Level: Dependent - Patient 0%    Lower Body Dressing/Undressing Lower body dressing      What is the patient wearing?: Incontinence brief, Pants     Lower body assist Assist for lower body dressing: Dependent - Patient 0%     Toileting Toileting    Toileting assist Assist for toileting: Total Assistance - Patient < 25%     Transfers Chair/bed transfer  Transfers assist  Chair/bed transfer activity did not occur: Safety/medical concerns  Chair/bed transfer assist level: Maximal Assistance - Patient 25 - 49% Chair/bed transfer assistive device: Walker(hemiwalker)   Locomotion Ambulation   Ambulation assist   Ambulation activity did not occur: Safety/medical concerns  Assist level: Moderate Assistance - Patient 50 - 74% Assistive device: Other (comment)(l handrail) Max distance: 22   Walk 10 feet activity   Assist  Walk 10 feet activity did not occur: Safety/medical concerns  Assist level: Moderate Assistance - Patient - 50 - 74% Assistive device: (handrail)   Walk 50 feet activity   Assist Walk 50 feet with 2 turns activity did not occur: Safety/medical concerns          Walk 150 feet activity   Assist Walk 150 feet activity did not occur: Safety/medical concerns         Walk 10 feet on uneven surface  activity   Assist Walk 10 feet on uneven surfaces activity did not occur: Safety/medical concerns         Wheelchair     Assist Will patient use wheelchair at discharge?: Yes Type of Wheelchair: Manual Wheelchair activity did not occur: Safety/medical concerns(lethargy; TIS would be dependent)  Wheelchair assist level: Moderate Assistance - Patient 50 - 74% Max wheelchair distance: 48'    Wheelchair 50 feet with 2 turns activity    Assist            Wheelchair 150 feet activity     Assist          Blood pressure (!) 157/74, pulse 63, temperature 97.9 F (36.6 C), temperature source Oral, resp. rate 18, height 5\' 11"  (1.803 m), weight 89.9 kg, SpO2 96 %.  Medical Problem List and Plan: 1.Functional, mobility and cognitive deficitssecondary to left basal ganglia hemorrhage  -Continue CIR PT, OT, SLP   -therapy intensity increased back to 3hr per day  -spastic right hemiparesis: Not a candidate d/t lethargy for oral antispasmodics. ?botox as outpt   -PRAFO/AFO, ROM with therapy/family       -arousal still can wax/wane: recent CT of head was stable to improved in appearance.   -SNF now pending potentially for Monday 10/07/19 2. Antithrombotics: -DVT/anticoagulation:Mechanical:Sequential compression devices, below kneeBilateral lower extremities--platelets stable to increased 90k -antiplatelet therapy: N/A 3.Chronic LBP/Pain Management:Tylenol prn.well controlled 4. Mood:LCSW to follow for evaluation and support.  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 Neuropsych: This patientis notcapable of making decisions on hisown behalf.  -  Amantadine increased to 100mg  bid    -ritalin started 5/11 at 2.5mg , increase to 5mg  bid  5/13. Bp/hr ok   -continue   5/20 reduced amantadine to daily --> observe 6. Skin/Wound Care:Routine pressure relief measures. 7. Fluids/Electrolytes/Nutrition:resumed po diet   5/21 pt off IVF since Monday. BUN/Cr holding steady.    -recheck once more Monday  8. HTN:  SBP goal<140--added prn catapres  -resumed low dose tenormin 5/5--> DBP still elevated. Increased to 25mg   Controlled on 5/21, HR in 40's to 50's at time-->prn hold parameters for tenormin  5/23- BP slightly elevated today before BP meds- 150s/80s- however otherwise, controlled- pulse 60s- con't regimen  9.H/oCOPD:Stable without meds. 10. Impaired glucose tolerance:CBG's wnl so far.  - SSI as indicated.   CBG (last 3)  Recent Labs    10/05/19 2128 10/06/19 0631 10/06/19 1139  GLUCAP 131* 110* 131*    5/23- BGs controlled- con't regimen 11. CAD/AVR:Off ASA due to bleed. No statin due to intolerance. Tenormin, cozaar and lasix (used prn) on hold.  -no TEE indicated 12. Thrombocytopenia:   Platelets decreased to 122 on 5/11.  - further drop to 83k today--has been as low as 73k during this admit  -recheck once more 5/22 to  make sure there's not a further downward trend  5/22- Plts stable  5/23- will order labs 1 more time in AM 13. AKI: Creatinine ranging from 1.11 --1.22   14.  Post stroke dysphagia:               -aspiration precautions  - pharyngeal dysphagia.    -diet changed to D1/nectars--upgrade soon to D2? 15. Constipation: Senna at bedtime   5/18---sorbitol 60cc today, SSE if needed  5/20 had bm's yesterday    -added scheduled miralax to daily regimen 16. SOB  5/22- Pt's CXR is completely Clear- think it could be due to taking shallow breaths based on exam.   5/23- no more complaints of OB per daughter 30. Dispo  5/22- d/c Monday  LOS: 27 days A FACE TO FACE EVALUATION WAS PERFORMED  Neyland Pettengill 10/06/2019, 2:00 PM

## 2019-10-07 ENCOUNTER — Inpatient Hospital Stay (HOSPITAL_COMMUNITY): Payer: Medicare Other

## 2019-10-07 ENCOUNTER — Inpatient Hospital Stay (HOSPITAL_COMMUNITY): Payer: Medicare Other | Admitting: Occupational Therapy

## 2019-10-07 ENCOUNTER — Inpatient Hospital Stay (HOSPITAL_COMMUNITY): Payer: Medicare Other | Admitting: Speech Pathology

## 2019-10-07 DIAGNOSIS — H8110 Benign paroxysmal vertigo, unspecified ear: Secondary | ICD-10-CM | POA: Diagnosis not present

## 2019-10-07 DIAGNOSIS — J690 Pneumonitis due to inhalation of food and vomit: Secondary | ICD-10-CM | POA: Diagnosis not present

## 2019-10-07 DIAGNOSIS — I610 Nontraumatic intracerebral hemorrhage in hemisphere, subcortical: Secondary | ICD-10-CM | POA: Diagnosis not present

## 2019-10-07 DIAGNOSIS — Z952 Presence of prosthetic heart valve: Secondary | ICD-10-CM | POA: Diagnosis not present

## 2019-10-07 DIAGNOSIS — R5381 Other malaise: Secondary | ICD-10-CM | POA: Diagnosis not present

## 2019-10-07 DIAGNOSIS — I5189 Other ill-defined heart diseases: Secondary | ICD-10-CM | POA: Diagnosis not present

## 2019-10-07 DIAGNOSIS — N179 Acute kidney failure, unspecified: Secondary | ICD-10-CM | POA: Diagnosis not present

## 2019-10-07 DIAGNOSIS — I69320 Aphasia following cerebral infarction: Secondary | ICD-10-CM | POA: Diagnosis not present

## 2019-10-07 DIAGNOSIS — F0789 Other personality and behavioral disorders due to known physiological condition: Secondary | ICD-10-CM | POA: Diagnosis not present

## 2019-10-07 DIAGNOSIS — R4701 Aphasia: Secondary | ICD-10-CM | POA: Diagnosis not present

## 2019-10-07 DIAGNOSIS — F09 Unspecified mental disorder due to known physiological condition: Secondary | ICD-10-CM | POA: Diagnosis not present

## 2019-10-07 DIAGNOSIS — I69328 Other speech and language deficits following cerebral infarction: Secondary | ICD-10-CM | POA: Diagnosis not present

## 2019-10-07 DIAGNOSIS — K219 Gastro-esophageal reflux disease without esophagitis: Secondary | ICD-10-CM | POA: Diagnosis not present

## 2019-10-07 DIAGNOSIS — Z720 Tobacco use: Secondary | ICD-10-CM | POA: Diagnosis not present

## 2019-10-07 DIAGNOSIS — F321 Major depressive disorder, single episode, moderate: Secondary | ICD-10-CM | POA: Diagnosis not present

## 2019-10-07 DIAGNOSIS — R1312 Dysphagia, oropharyngeal phase: Secondary | ICD-10-CM | POA: Diagnosis not present

## 2019-10-07 DIAGNOSIS — E785 Hyperlipidemia, unspecified: Secondary | ICD-10-CM | POA: Diagnosis not present

## 2019-10-07 DIAGNOSIS — R58 Hemorrhage, not elsewhere classified: Secondary | ICD-10-CM | POA: Diagnosis not present

## 2019-10-07 DIAGNOSIS — Z7401 Bed confinement status: Secondary | ICD-10-CM | POA: Diagnosis not present

## 2019-10-07 DIAGNOSIS — R251 Tremor, unspecified: Secondary | ICD-10-CM | POA: Diagnosis not present

## 2019-10-07 DIAGNOSIS — G459 Transient cerebral ischemic attack, unspecified: Secondary | ICD-10-CM | POA: Diagnosis not present

## 2019-10-07 DIAGNOSIS — I69391 Dysphagia following cerebral infarction: Secondary | ICD-10-CM | POA: Diagnosis not present

## 2019-10-07 DIAGNOSIS — I69922 Dysarthria following unspecified cerebrovascular disease: Secondary | ICD-10-CM | POA: Diagnosis not present

## 2019-10-07 DIAGNOSIS — Z20822 Contact with and (suspected) exposure to covid-19: Secondary | ICD-10-CM | POA: Diagnosis not present

## 2019-10-07 DIAGNOSIS — I69151 Hemiplegia and hemiparesis following nontraumatic intracerebral hemorrhage affecting right dominant side: Secondary | ICD-10-CM | POA: Diagnosis not present

## 2019-10-07 DIAGNOSIS — J449 Chronic obstructive pulmonary disease, unspecified: Secondary | ICD-10-CM | POA: Diagnosis not present

## 2019-10-07 DIAGNOSIS — J069 Acute upper respiratory infection, unspecified: Secondary | ICD-10-CM | POA: Diagnosis not present

## 2019-10-07 DIAGNOSIS — I2510 Atherosclerotic heart disease of native coronary artery without angina pectoris: Secondary | ICD-10-CM | POA: Diagnosis not present

## 2019-10-07 DIAGNOSIS — M1990 Unspecified osteoarthritis, unspecified site: Secondary | ICD-10-CM | POA: Diagnosis not present

## 2019-10-07 DIAGNOSIS — D696 Thrombocytopenia, unspecified: Secondary | ICD-10-CM | POA: Diagnosis not present

## 2019-10-07 DIAGNOSIS — I69351 Hemiplegia and hemiparesis following cerebral infarction affecting right dominant side: Secondary | ICD-10-CM | POA: Diagnosis not present

## 2019-10-07 DIAGNOSIS — I10 Essential (primary) hypertension: Secondary | ICD-10-CM | POA: Diagnosis not present

## 2019-10-07 DIAGNOSIS — Z66 Do not resuscitate: Secondary | ICD-10-CM | POA: Diagnosis not present

## 2019-10-07 DIAGNOSIS — I69120 Aphasia following nontraumatic intracerebral hemorrhage: Secondary | ICD-10-CM | POA: Diagnosis not present

## 2019-10-07 DIAGNOSIS — M2550 Pain in unspecified joint: Secondary | ICD-10-CM | POA: Diagnosis not present

## 2019-10-07 LAB — GLUCOSE, CAPILLARY
Glucose-Capillary: 167 mg/dL — ABNORMAL HIGH (ref 70–99)
Glucose-Capillary: 112 mg/dL — ABNORMAL HIGH (ref 70–99)

## 2019-10-07 LAB — CBC WITH DIFFERENTIAL/PLATELET
Abs Immature Granulocytes: 0.02 10*3/uL (ref 0.00–0.07)
Basophils Absolute: 0 10*3/uL (ref 0.0–0.1)
Basophils Relative: 0 %
Eosinophils Absolute: 0.2 10*3/uL (ref 0.0–0.5)
Eosinophils Relative: 3 %
HCT: 49.2 % (ref 39.0–52.0)
Hemoglobin: 16.2 g/dL (ref 13.0–17.0)
Immature Granulocytes: 0 %
Lymphocytes Relative: 30 %
Lymphs Abs: 2.1 10*3/uL (ref 0.7–4.0)
MCH: 30.8 pg (ref 26.0–34.0)
MCHC: 32.9 g/dL (ref 30.0–36.0)
MCV: 93.5 fL (ref 80.0–100.0)
Monocytes Absolute: 0.7 10*3/uL (ref 0.1–1.0)
Monocytes Relative: 10 %
Neutro Abs: 3.9 10*3/uL (ref 1.7–7.7)
Neutrophils Relative %: 57 %
Platelets: 78 10*3/uL — ABNORMAL LOW (ref 150–400)
RBC: 5.26 MIL/uL (ref 4.22–5.81)
RDW: 14 % (ref 11.5–15.5)
WBC: 6.9 10*3/uL (ref 4.0–10.5)
nRBC: 0 % (ref 0.0–0.2)

## 2019-10-07 LAB — BASIC METABOLIC PANEL
Anion gap: 10 (ref 5–15)
BUN: 14 mg/dL (ref 8–23)
CO2: 27 mmol/L (ref 22–32)
Calcium: 9.2 mg/dL (ref 8.9–10.3)
Chloride: 102 mmol/L (ref 98–111)
Creatinine, Ser: 1.16 mg/dL (ref 0.61–1.24)
GFR calc Af Amer: >60 mL/min (ref >60)
GFR calc non Af Amer: 54 mL/min — ABNORMAL LOW (ref >60)
Glucose, Bld: 90 mg/dL (ref 70–99)
Potassium: 3.4 mmol/L — ABNORMAL LOW (ref 3.5–5.1)
Sodium: 139 mmol/L (ref 135–145)

## 2019-10-07 NOTE — Plan of Care (Signed)
  Problem: Consults Goal: RH GENERAL PATIENT EDUCATION Description: See Patient Education module for education specifics. Outcome: Progressing Goal: Skin Care Protocol Initiated - if Braden Score 18 or less Description: Patients skin will be free from break down Outcome: Progressing Goal: Nutrition Consult-if indicated Outcome: Progressing Goal: Diabetes Guidelines if Diabetic/Glucose > 140 Description: If diabetic or lab glucose is > 140 mg/dl - Initiate Diabetes/Hyperglycemia Guidelines & Document Interventions  Outcome: Progressing   Problem: RH BOWEL ELIMINATION Goal: RH STG MANAGE BOWEL WITH ASSISTANCE Description: STG Manage Bowel with mod I Assistance. Outcome: Progressing Goal: RH STG MANAGE BOWEL W/MEDICATION W/ASSISTANCE Description: STG Manage Bowel with Medication with mod I Assistance. Outcome: Progressing   Problem: RH BLADDER ELIMINATION Goal: RH STG MANAGE BLADDER WITH ASSISTANCE Description: STG Manage Bladder With mod I Assistance Outcome: Progressing   Problem: RH SKIN INTEGRITY Goal: RH STG SKIN FREE OF INFECTION/BREAKDOWN Description: Patient will be free from skin break down Outcome: Progressing Goal: RH STG MAINTAIN SKIN INTEGRITY WITH ASSISTANCE Description: STG Maintain Skin Integrity With mod I Assistance. Outcome: Progressing Goal: RH STG ABLE TO PERFORM INCISION/WOUND CARE W/ASSISTANCE Description: STG Able To Perform Incision/Wound Care With mod I Assistance. Outcome: Progressing   Problem: RH SAFETY Goal: RH STG ADHERE TO SAFETY PRECAUTIONS W/ASSISTANCE/DEVICE Description: STG Adhere to Safety Precautions With Assistance/Device. Outcome: Progressing   Problem: RH PAIN MANAGEMENT Goal: RH STG PAIN MANAGED AT OR BELOW PT'S PAIN GOAL Description: Pain scale <3/10 Outcome: Progressing   Problem: RH KNOWLEDGE DEFICIT GENERAL Goal: RH STG INCREASE KNOWLEDGE OF SELF CARE AFTER HOSPITALIZATION Outcome: Progressing   Problem: Consults Goal: RH  STROKE PATIENT EDUCATION Description: See Patient Education module for education specifics  Outcome: Progressing   Problem: RH KNOWLEDGE DEFICIT Goal: RH STG INCREASE KNOWLEDGE OF HYPERTENSION Description: Patient will be able to verbalize anti-hypertensive medication & side effects by discharge Outcome: Progressing Goal: RH STG INCREASE KNOWLEDGE OF DYSPHAGIA/FLUID INTAKE Description: Patient will be able to verbalize need for specialty diet Outcome: Progressing Goal: RH STG INCREASE KNOWLEDGE OF STROKE PROPHYLAXIS Description: Patient will be able to verbalize medications that are for stroke prophylaxis Outcome: Progressing   Problem: Consults Goal: RH STROKE PATIENT EDUCATION Description: See Patient Education module for education specifics  Outcome: Progressing

## 2019-10-07 NOTE — Progress Notes (Signed)
Inpatient Rehab Care Coordinator Discharge Note Inpatient Rehabilitation Care Coordinator  Discharge Note  The overall goal for the admission was met for:   Discharge location: [facility]  Length of Stay: 29 days with discharge 10/07/19  Discharge activity level: Patient to discharge at a wheelchair level Mod Assist.  Home/community participation: Limited Participation  Services provided included: MD, RD, PT, OT, SLP, RN, CM, TR, Pharmacy, Neuropsych and SW  Financial Services: Medicare and Other: [employer]  Follow-up services arranged: Other: SNF  Comments (or additional information): [facility] [address] [place], [place] [address] [phone] Room 501  TOC orders for labs (CBC and BMET on 10/09/19) added to discharge packet  Patient/Family verbalized understanding of follow-up arrangements: Yes   Patient's care partner unable to provide the necessary physical and cognitive assistance at discharge.  Individual responsible for coordination of the follow-up plan: Daughter: [name] [phone]  Daughter made aware of room number and SNF confirmed admission paperwork completed.     Margarito Liner

## 2019-10-07 NOTE — Progress Notes (Signed)
Pt discharge to SNF via [facility]. 2 bags of belongings sent with patient. All other belongings and equipment sent home with daughter.

## 2019-10-07 NOTE — Progress Notes (Signed)
Occupational Therapy Discharge Summary  Patient Details  Name: David [name] MRN: [id number] Date of Birth: 1923-09-21  Today's Date: 10/07/2019 OT Individual Time: 6770-3403 OT Individual Time Calculation (min): 59 min    Pt greeted semi-reclined in bed with daughter present and agreeable to OT treatment session. Worked on donning pants at bed level with max A to thread pants, then worked on hip bridging with pt able to assist with pulling pants up L hip. Pt completed bed mobility to sitting EOB with max A of 1. Pt able to maintain static sitting balance with supervision. Pt needed increased assistance when doffing shirt and donning clean one, up to mod A. While sitting EOB, OT placed kinesiotape on R shoulder to help support subluxation. Worked on lateral scoots along EOB with max A. See functional navigator for further details on BADL performance. Pt left semi-reclined in bed with bed alarm on, call bell in reach, and needs met.   Patient has met 8 of 9 long term goals due to improved activity tolerance, improved balance, postural control, ability to compensate for deficits, functional use of  RIGHT upper and RIGHT lower extremity, improved attention, improved awareness and improved coordination.  Patient to discharge at overall Mod Assist/max A level.  Patient's care partner is unable to provide the necessary physical and cognitive assistance at discharge.  Therefore pt to dc to SNF for continued rehabilitation in next venue of care.  Reasons goals not met: Pt still needs max A for toilet transfers.  Recommendation:  Patient will benefit from ongoing skilled OT services in skilled nursing facility setting to continue to advance functional skills in the area of BADL, Reduce care partner burden and functional use of R UE.  Equipment: No equipment provided  Reasons for discharge: treatment goals met and discharge from hospital  Patient/family agrees with progress made and goals achieved:  Yes  OT Discharge Precautions/Restrictions  Precautions Precautions: Fall Precaution Comments: R hemiplegia Restrictions Weight Bearing Restrictions: No Pain  denies pain ADL ADL Eating: Moderate assistance Grooming: Minimal assistance Upper Body Bathing: Minimal assistance Lower Body Bathing: Maximal assistance Upper Body Dressing: Moderate assistance Lower Body Dressing: Maximal assistance Toileting: Dependent Toilet Transfer: Maximal assistance Vision Baseline Vision/History: Wears glasses Wears Glasses: Reading only Patient Visual Report: Diplopia(unable to fully assess due to cognitive deficits, reported seeing 2 fingers when PT presented 1, able to state correctly when asked a second time) Vision Assessment?: Vision impaired- to be further tested in functional context Perception  Inattention/Neglect: Does not attend to right visual field Praxis Praxis Impairment Details: Motor planning Cognition Overall Cognitive Status: Impaired/Different from baseline Orientation Level: Oriented X4 Sustained Attention: Impaired Sustained Attention Impairment: Functional basic;Verbal basic Memory: Impaired Memory Impairment: Decreased short term memory;Decreased recall of new information Decreased Short Term Memory: Verbal basic;Functional basic Awareness: Impaired Awareness Impairment: Emergent impairment Problem Solving: Impaired Problem Solving Impairment: Verbal basic;Functional basic Safety/Judgment: Impaired Sensation Sensation Light Touch: Impaired by gross assessment Proprioception: Impaired by gross assessment Additional Comments: reports that he does not feel anything on his R side, no response to light touch or painful stimulus Coordination Gross Motor Movements are Fluid and Coordinated: No Fine Motor Movements are Fluid and Coordinated: No Coordination and Movement Description: R hemi Motor  Motor Motor: Hemiplegia;Abnormal tone;Abnormal postural alignment and  control Motor - Discharge Observations: some improved motor return to R UE and R LE Mobility  Transfers Sit to Stand: Moderate Assistance - Patient 50-74% Stand to Sit: Moderate Assistance - Patient 50-74%  Balance  Static Sitting Balance Static Sitting - Level of Assistance: 5: Stand by assistance Dynamic Sitting Balance Dynamic Sitting - Level of Assistance: 4: Min assist;3: Mod assist Static Standing Balance Static Standing - Level of Assistance: 3: Mod assist Dynamic Standing Balance Dynamic Standing - Level of Assistance: 3: Mod assist Extremity/Trunk Assessment RUE Assessment RUE Assessment: Exceptions to [facility] General Strength Comments: Pt with some thumb and first finger activation, also demonstrated some flexor synegy patterns but inconsistent turning it on RUE Body System: Neuro Brunstrum levels for arm and hand: Arm;Hand Brunstrum level for arm: Stage II Synergy is developing Brunstrum level for hand: Stage II Synergy is developing LUE Assessment LUE Assessment: Within Functional Limits   Daneen Schick Wilena Tyndall 10/07/2019, 12:40 PM

## 2019-10-07 NOTE — Progress Notes (Signed)
[facility] PHYSICAL MEDICINE & REHABILITATION PROGRESS NOTE  Subjective/Complaints:   Up working with PT this morning. No new issues. Daughter at bedside. Having more sense of bowel and bladder filling  ROS: Limited due to cognitive/behavioral     Objective: DG CHEST PORT 1 VIEW  Result Date: 10/05/2019 CLINICAL DATA:  Shortness of breath EXAM: PORTABLE CHEST 1 VIEW COMPARISON:  09/13/2019 FINDINGS: Cardiac shadow is within normal limits. Postsurgical changes are noted. No focal infiltrate or sizable effusion is seen. No bony abnormality is noted. IMPRESSION: No active disease. Electronically Signed   By: Inez Catalina M.D.   On: 10/05/2019 14:39   Recent Labs    10/05/19 0545  WBC 7.3  HGB 15.4  HCT 46.2  PLT 83*   No results for input(s): NA, K, CL, CO2, GLUCOSE, BUN, CREATININE, CALCIUM in the last 72 hours.  Intake/Output Summary (Last 24 hours) at 10/07/2019 0921 Last data filed at 10/07/2019 0915 Gross per 24 hour  Intake 480 ml  Output --  Net 480 ml     Physical Exam: Vital Signs Blood pressure 126/75, pulse 67, temperature 97.7 F (36.5 C), temperature source Oral, resp. rate 19, height 5\' 11"  (1.803 m), weight 88.2 kg, SpO2 95 %. Constitutional: No distress . Vital signs reviewed. HEENT: EOMI, oral membranes moist Neck: supple Cardiovascular: RRR without murmur. No JVD    Respiratory/Chest: CTA Bilaterally without wheezes or rales. Normal effort    GI/Abdomen: BS +, non-tender, non-distended Ext: no clubbing, cyanosis, or edema Psych: cooperative Musc: No edema in extremities.  No tenderness in extremities. Neurological:   Speech still dysarthric, confused.  Flexor tone RUE, extensor tone RLE still present. --> MAS 1 to 1+/4 --right bicep better today, more just extensor tone RLE today Skin: numerous bruises/lacs on arms all stable.  Small area redness near sacrum/buttocks improved  Assessment/Plan: 1. Functional deficits secondary to left BG ICH which  require 3+ hours per day of interdisciplinary therapy in a comprehensive inpatient rehab setting.  Physiatrist is providing close team supervision and 24 hour management of active medical problems listed below.  Physiatrist and rehab team continue to assess barriers to discharge/monitor patient progress toward functional and medical goals  Care Tool:  Bathing        Body parts bathed by helper: Right arm, Chest, Abdomen, Right upper leg, Left upper leg, Face Body parts n/a: Left lower leg, Right lower leg, Front perineal area, Buttocks, Left arm   Bathing assist Assist Level: Moderate Assistance - Patient 50 - 74%     Upper Body Dressing/Undressing Upper body dressing Upper body dressing/undressing activity did not occur (including orthotics): N/A What is the patient wearing?: Pull over shirt    Upper body assist Assist Level: Moderate Assistance - Patient 50 - 74%    Lower Body Dressing/Undressing Lower body dressing      What is the patient wearing?: Pants     Lower body assist Assist for lower body dressing: Maximal Assistance - Patient 25 - 49%     Toileting Toileting    Toileting assist Assist for toileting: Total Assistance - Patient < 25%     Transfers Chair/bed transfer  Transfers assist  Chair/bed transfer activity did not occur: Safety/medical concerns  Chair/bed transfer assist level: Maximal Assistance - Patient 25 - 49% Chair/bed transfer assistive device: Walker(hemiwalker)   Locomotion Ambulation   Ambulation assist   Ambulation activity did not occur: Safety/medical concerns  Assist level: Moderate Assistance - Patient 50 - 74% Assistive device:  Other (comment)(l handrail) Max distance: 22   Walk 10 feet activity   Assist  Walk 10 feet activity did not occur: Safety/medical concerns  Assist level: Moderate Assistance - Patient - 50 - 74% Assistive device: (handrail)   Walk 50 feet activity   Assist Walk 50 feet with 2 turns  activity did not occur: Safety/medical concerns         Walk 150 feet activity   Assist Walk 150 feet activity did not occur: Safety/medical concerns         Walk 10 feet on uneven surface  activity   Assist Walk 10 feet on uneven surfaces activity did not occur: Safety/medical concerns         Wheelchair     Assist Will patient use wheelchair at discharge?: Yes Type of Wheelchair: Manual Wheelchair activity did not occur: Safety/medical concerns(lethargy; TIS would be dependent)  Wheelchair assist level: Moderate Assistance - Patient 50 - 74% Max wheelchair distance: 31'    Wheelchair 50 feet with 2 turns activity    Assist            Wheelchair 150 feet activity     Assist          Blood pressure 126/75, pulse 67, temperature 97.7 F (36.5 C), temperature source Oral, resp. rate 19, height 5\' 11"  (1.803 m), weight 88.2 kg, SpO2 95 %.  Medical Problem List and Plan: 1.Functional, mobility and cognitive deficitssecondary to left basal ganglia hemorrhage  -Continue CIR PT, OT, SLP   -therapy intensity increased back to 3hr per day  -spastic right hemiparesis: Not a candidate d/t lethargy for oral antispasmodics. ?botox as outpt   -PRAFO/AFO, ROM with therapy/family       -arousal still can wax/wane: recent CT of head was stable to improved in appearance.   -dc to SNF today. I'll see in the office in 4-6 weeks 2. Antithrombotics: -DVT/anticoagulation:Mechanical:Sequential compression devices, below kneeBilateral lower extremities--platelets stable to increased 90k -antiplatelet therapy: N/A 3.Chronic LBP/Pain Management:Tylenol prn.well controlled 4. Mood:LCSW to follow for evaluation and support.  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 Neuropsych: This patientis notcapable of making decisions on hisown behalf.  -  Amantadine increased to 100mg  bid     -ritalin started 5/11 at 2.5mg , increase to 5mg  bid 5/13. Bp/hr ok   -continue   5/20 reduced amantadine to daily --> can wean off at SNF 6. Skin/Wound Care:Routine pressure relief measures. 7. Fluids/Electrolytes/Nutrition:resumed po diet   5/21 pt off IVF since Monday. BUN/Cr holding steady.    -recheck at SNF this week  8. HTN:  SBP goal<140--added prn catapres  -resumed low dose tenormin 5/5--> DBP still elevated. Increased to 25mg   Controlled on 5/21, HR in 40's to 50's at time-->prn hold parameters for tenormin  5/24 bp controlled  9.H/oCOPD:Stable without meds. 10. Impaired glucose tolerance:CBG's wnl so far.  - SSI as indicated.   CBG (last 3)  Recent Labs    10/06/19 1648 10/06/19 2046 10/07/19 0601  GLUCAP 139* 139* 167*    5/23- BGs controlled- con't regimen 11. CAD/AVR:Off ASA due to bleed. No statin due to intolerance. Tenormin, cozaar and lasix (used prn) on hold.  -no TEE indicated 12. Thrombocytopenia:   5/22- Plts stable   5/23- platelets 83k again on 5/22---need to be checked by end of week 13. AKI: Creatinine ranging from 1.11 --1.22   14.  Post stroke dysphagia:               -  aspiration precautions  - pharyngeal dysphagia.    -diet changed to D2/nectars-tolerating well 15. Constipation: Senna at bedtime   5/24 on miralax daily  -large bm yesterday  -pt with more awareness of need to empty now 16. SOB  5/22- Pt's CXR is completely Clear- think it could be due to taking shallow breaths based on exam.   5/23- no more complaints of OB per daughter    LOS: 28 days A FACE TO [place] 10/07/2019, 9:21 AM

## 2019-10-07 NOTE — Plan of Care (Signed)
Problem: Consults Goal: RH GENERAL PATIENT EDUCATION Description: See Patient Education module for education specifics. 10/07/2019 1217 by Rodolph Bong, LPN Outcome: Completed/Met 10/07/2019 1217 by Rodolph Bong, LPN Outcome: Progressing Goal: Skin Care Protocol Initiated - if Braden Score 18 or less Description: Patients skin will be free from break down 10/07/2019 1217 by Rodolph Bong, LPN Outcome: Completed/Met 10/07/2019 1217 by Rodolph Bong, LPN Outcome: Progressing Goal: Nutrition Consult-if indicated 10/07/2019 1217 by Rodolph Bong, LPN Outcome: Completed/Met 10/07/2019 1217 by Rodolph Bong, LPN Outcome: Progressing Goal: Diabetes Guidelines if Diabetic/Glucose > 140 Description: If diabetic or lab glucose is > 140 mg/dl - Initiate Diabetes/Hyperglycemia Guidelines & Document Interventions  10/07/2019 1217 by Rodolph Bong, LPN Outcome: Completed/Met 10/07/2019 1217 by Rodolph Bong, LPN Outcome: Progressing   Problem: RH BOWEL ELIMINATION Goal: RH STG MANAGE BOWEL WITH ASSISTANCE Description: STG Manage Bowel with mod I Assistance. 10/07/2019 1217 by Rodolph Bong, LPN Outcome: Completed/Met 10/07/2019 1217 by Rodolph Bong, LPN Outcome: Progressing Goal: RH STG MANAGE BOWEL W/MEDICATION W/ASSISTANCE Description: STG Manage Bowel with Medication with mod I Assistance. 10/07/2019 1217 by Rodolph Bong, LPN Outcome: Completed/Met 10/07/2019 1217 by Rodolph Bong, LPN Outcome: Progressing   Problem: RH BLADDER ELIMINATION Goal: RH STG MANAGE BLADDER WITH ASSISTANCE Description: STG Manage Bladder With mod I Assistance 10/07/2019 1217 by Rodolph Bong, LPN Outcome: Completed/Met 10/07/2019 1217 by Rodolph Bong, LPN Outcome: Progressing   Problem: RH SKIN INTEGRITY Goal: RH STG SKIN FREE OF INFECTION/BREAKDOWN Description: Patient will be free from skin break down 10/07/2019 1217 by Rodolph Bong, LPN Outcome: Completed/Met 10/07/2019 1217 by Rodolph Bong, LPN Outcome: Progressing Goal: RH STG MAINTAIN SKIN INTEGRITY WITH ASSISTANCE Description: STG Maintain Skin Integrity With mod I Assistance. 10/07/2019 1217 by Rodolph Bong, LPN Outcome: Completed/Met 10/07/2019 1217 by Rodolph Bong, LPN Outcome: Progressing Goal: RH STG ABLE TO PERFORM INCISION/WOUND CARE W/ASSISTANCE Description: STG Able To Perform Incision/Wound Care With mod I Assistance. 10/07/2019 1217 by Rodolph Bong, LPN Outcome: Completed/Met 10/07/2019 1217 by Rodolph Bong, LPN Outcome: Progressing   Problem: RH SAFETY Goal: RH STG ADHERE TO SAFETY PRECAUTIONS W/ASSISTANCE/DEVICE Description: STG Adhere to Safety Precautions With Assistance/Device. 10/07/2019 1217 by Rodolph Bong, LPN Outcome: Completed/Met 10/07/2019 1217 by Rodolph Bong, LPN Outcome: Progressing   Problem: RH PAIN MANAGEMENT Goal: RH STG PAIN MANAGED AT OR BELOW PT'S PAIN GOAL Description: Pain scale <3/10 10/07/2019 1217 by Rodolph Bong, LPN Outcome: Completed/Met 10/07/2019 1217 by Rodolph Bong, LPN Outcome: Progressing   Problem: RH KNOWLEDGE DEFICIT GENERAL Goal: RH STG INCREASE KNOWLEDGE OF SELF CARE AFTER HOSPITALIZATION 10/07/2019 1217 by Rodolph Bong, LPN Outcome: Completed/Met 10/07/2019 1217 by Rodolph Bong, LPN Outcome: Progressing   Problem: Consults Goal: RH STROKE PATIENT EDUCATION Description: See Patient Education module for education specifics  10/07/2019 1217 by Rodolph Bong, LPN Outcome: Completed/Met 10/07/2019 1217 by Rodolph Bong, LPN Outcome: Progressing   Problem: RH KNOWLEDGE DEFICIT Goal: RH STG INCREASE KNOWLEDGE OF HYPERTENSION Description: Patient will be able to verbalize anti-hypertensive medication & side effects by discharge 10/07/2019 1217 by Rodolph Bong, LPN Outcome: Completed/Met 10/07/2019 1217 by Rodolph Bong, LPN Outcome: Progressing Goal: RH STG INCREASE KNOWLEDGE OF DYSPHAGIA/FLUID INTAKE Description: Patient will be  able to verbalize need for specialty diet 10/07/2019 1217 by Rodolph Bong, LPN Outcome: Completed/Met 10/07/2019 1217 by Rodolph Bong, LPN Outcome: Progressing Goal: RH STG INCREASE  KNOWLEDGE OF STROKE PROPHYLAXIS Description: Patient will be able to verbalize medications that are for stroke prophylaxis 10/07/2019 1217 by Rodolph Bong, LPN Outcome: Completed/Met 10/07/2019 1217 by Rodolph Bong, LPN Outcome: Progressing   Problem: Consults Goal: RH STROKE PATIENT EDUCATION Description: See Patient Education module for education specifics  10/07/2019 1217 by Rodolph Bong, LPN Outcome: Completed/Met 10/07/2019 1217 by Rodolph Bong, LPN Outcome: Progressing

## 2019-10-07 NOTE — Progress Notes (Signed)
Speech Language Pathology Discharge Summary  Patient Details  Name: David [name] MRN: [id number] Date of Birth: November 04, 1923  Today's Date: 10/07/2019 SLP Individual Time: 5797-2820 SLP Individual Time Calculation (min): 25 min   Skilled Therapeutic Interventions:  Skilled treatment session focused on completion of family education the patient and his daughter. Both were educated on patient's current swallowing function, diet recommendations, medication administration, ice chip protocol, and swallowing compensatory strategies.  They were also educated on the need for a repeat objective swallow study prior to liquid upgrade.  Both verbalized understanding. All questions were answered to their satisfaction. Patient left upright in bed with daughter present. Continue with current plan of care.   Patient has met 9 of 8 long term goals.  Patient to discharge at [facility] level.   Reasons goals not met: Patient continues to require overall Mod-Max A verbal cues for basic problem solving   Clinical Impression/Discharge Summary: Patient has made excellent gains and has met 8 of 9 LTGs this admission. Currently, patient is consuming Dys. 2 textures with nectar-thick liquids with minimal overt s/s of aspiration and overall supervision level verbal cues for use of swallowing compensatory strategies. Patient is also on an ice chip protocol. Patient's most recent MBS on 5/18 showed continued silent aspiration of thin liquids. Therefore, patient will need a repeat objective swallow study prior to upgrade of liquids. Patient demonstrates improved speech intelligibility and is ~90% intelligible at the phrase and sentence level with Min verbal cues needed for use of speech intelligibility strategies. Patient continues to demonstrate mild-moderate impairments in language impacting auditory comprehension and word-finding intermittently requiring overall Min A verbal cues. Patient's cognitive functioning has also  improved but patient requires overall Min A verbal cues for orientation and attention and Mod verbal cues for basic problem solving and emergent awareness. Patient and family education is complete but patient's family is unable to provide the necessary level of assistance needed at this time, therefore, patient will discharge to a SNF. Patient would benefit from f/u SLP services to maximize his cognitive and swallowing function as well as his functional communication in order to reduce caregiver burden.   Care Partner:  Caregiver Able to Provide Assistance: No  Type of Caregiver Assistance: Physical;Cognitive  Recommendation:  Skilled Nursing facility  Rationale for SLP Follow Up: Maximize functional communication;Maximize swallowing safety;Maximize cognitive function and independence;Reduce caregiver burden   Equipment: thickener   Reasons for discharge: Discharged from hospital;Treatment goals met   Patient/Family Agrees with Progress Made and Goals Achieved: Yes    [place], [place] 10/07/2019, 6:22 AM

## 2019-10-07 NOTE — Plan of Care (Signed)
  Problem: RH Toilet Transfers Goal: LTG Patient will perform toilet transfers w/assist (OT) Description: LTG: Patient will perform toilet transfers with assist, with/without cues using equipment (OT) Outcome: Not Met (add Reason) Note: Pt needs max A for toilet transfers

## 2019-10-08 ENCOUNTER — Non-Acute Institutional Stay (SKILLED_NURSING_FACILITY): Payer: Medicare Other | Admitting: Internal Medicine

## 2019-10-08 ENCOUNTER — Encounter: Payer: Self-pay | Admitting: Internal Medicine

## 2019-10-08 DIAGNOSIS — F321 Major depressive disorder, single episode, moderate: Secondary | ICD-10-CM

## 2019-10-08 DIAGNOSIS — I5189 Other ill-defined heart diseases: Secondary | ICD-10-CM

## 2019-10-08 DIAGNOSIS — Z66 Do not resuscitate: Secondary | ICD-10-CM

## 2019-10-08 DIAGNOSIS — D696 Thrombocytopenia, unspecified: Secondary | ICD-10-CM | POA: Diagnosis not present

## 2019-10-08 DIAGNOSIS — R4701 Aphasia: Secondary | ICD-10-CM

## 2019-10-08 DIAGNOSIS — J690 Pneumonitis due to inhalation of food and vomit: Secondary | ICD-10-CM | POA: Diagnosis not present

## 2019-10-08 DIAGNOSIS — I69391 Dysphagia following cerebral infarction: Secondary | ICD-10-CM

## 2019-10-08 DIAGNOSIS — I69922 Dysarthria following unspecified cerebrovascular disease: Secondary | ICD-10-CM

## 2019-10-08 DIAGNOSIS — I10 Essential (primary) hypertension: Secondary | ICD-10-CM

## 2019-10-08 DIAGNOSIS — I610 Nontraumatic intracerebral hemorrhage in hemisphere, subcortical: Secondary | ICD-10-CM | POA: Diagnosis not present

## 2019-10-08 NOTE — Progress Notes (Signed)
Patient ID: David [name], male   DOB: 1923-11-24, 86 y.o.   MRN: [id number]   Informed Caryl Pina with admissions at [facility] that Dr. Naaman Plummer is requesting CBC and BMET is done on Wednesday

## 2019-10-08 NOTE — Progress Notes (Signed)
Patient ID: David [name], male   DOB: [birth year], 86 y.o.   MRN: [id number]   Provider:  Rexene Edison. Mariea Clonts, D.O., C.M.D. Location:  [place] Room Number: [place]:   SNF  PCP: Gayland Curry, DO Patient Care Team: Gayland Curry, DO as PCP - General (Geriatric Medicine) Troy Sine, MD as PCP - Cardiology (Cardiology) Meredith Staggers, MD as Consulting Physician (Physical Medicine and Rehabilitation)  Extended Emergency Contact Information Primary Emergency Contact: [place] Phone: [phone] Mobile Phone: [phone] Relation: Daughter Secondary Emergency Contact: [place] Phone: [phone] Mobile Phone: [phone] Relation: None  Code Status: DNR Goals of Care: Advanced Directive information Advanced Directives 10/08/2019  Does Patient Have a Medical Advance Directive? Yes  Type of Advance Directive Out of facility DNR (pink MOST or yellow form)  Does patient want to make changes to medical advance directive? No - Patient declined  Copy of [place] in Chart? -   Chief Complaint  Patient presents with  . New Admit To SNF    admission to SNF     HPI: Patient is a 86 y.o. male with h/o CAD, COPD, htn, hyperlipidemia, impaired glucose tolerance, bioprosthetic AVR (bonine in 2009) seen today for admission to [facility] Rehab s/p hospitalization and [place] stay.  He lived with his daughter and she had gone to her appt and came home finding him lying b/w two beds an hour later. CT showed left intracranial hemorrhage.  MRI/MRA showed stable acute left basal ganglia hemorrhage with remote bilateral basal ganglia lacunar infarcts and advanced white matter disease.  Bleed was felt to be due to small vessel disease vs thrombocytopenia with platelets 93 at hospital admission 4/21.  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  Pt had no clinical signs suggestive of endocarditis.    He underwent MBS 4/23 which showed moderate dysphagia with delayed swallow and trace aspiration of thin liquids.  He was placed on honey-thickened liquids via spoon.  He was struggling with dysphagia, aphasia, delayed processing with difficulty following one-step commands, bilateral weakness, visual impairments all affecting his ADLs and mobility.  He was hospitalized thru 4/26 when he went to inpatient rehab at [facility] due to above.    He received PT, OT, ST for at least 3 hrs 5 days per week.  He had a bout of lethargy, low grade fever and abdominal distention--treated for constipation and CT showed slight increased edema and bleeding at left basal ganglia, also some worsening renal function so he was given IVFs.  There was question of some pneumonia on xray so he was treated with IV zosyn and made NPO.  F/u imaging was negative and abx eventually stopped.  He had repeat MBS done and was started on dysphagia 1 honey liquids due to inconsistent swallow and to minimize aspiration.  He was given IVFs and renal function improved.  Diet was changed to dypshagia 2 with nectar thickened liquids and he was then able to maintain hydration with water, ice chips.  BP was running high so low dose tenormin got added.  He had depressed mood and was started on lexapro.  Ritalin was added for motivation, but mental status was still waxing and waning.  Amantadine also was added for activation.  He's had some  gradual improvement and family elected for her to come her for rehab and he arrived 10/07/19.  He is to f/u with PMR in 4 wks with Dr. Naaman Plummer and neurology at [facility] for follow-up.  He should f/u with his PCP Dr. Alain Marion when discharged from [facility] if he's able to return home with assistance.    Past Medical History:  Diagnosis Date  . CAD (coronary artery disease)     intolerance of statins  . COPD (chronic obstructive pulmonary disease) ([place])   . Current use of long term anticoagulation   . GERD (gastroesophageal reflux disease)   . Hyperlipidemia   . Hypertension   . Impaired glucose tolerance 02/14/2011  . LBP (low back pain)   . OA (osteoarthritis)   . 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   Past Surgical History:  Procedure Laterality Date  . 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 CARDIAC CATHETERIZATION  10/05/1998   Recommend CABG revascularizaition  . CARDIAC CATHETERIZATION  06/11/1999   Continue medical therapy  . CARDIAC CATHETERIZATION  01/08/2007   Patent grafts, recommend medical therapy  . CARDIOVASCULAR STRESS TEST  04/22/2010   Small lateral defect which is partially reversible, no ischemic EKG changes were noted.  Marland Kitchen CAROTID DOPPLER  04/22/2010   Bilateral ICAs-no evidence of diametere reduction, significant tortuosity, or any other vascular abnormality.  . CORONARY ARTERY BYPASS GRAFT  10/09/1998   LIMA to LAD, SVG to diag, SVG sequential to 2 marginals, SVG sequential to PDA & PLA. CE#25 porcine AVR.  Marland Kitchen TRANSTHORACIC ECHOCARDIOGRAM  05/14/2012   EF 50-55%, bioprosthesis of the aortic valve with well preserved LV function, moderate concentric [place]    Social History   Socioeconomic History  . Marital status: Widowed    Spouse name: Not on file  . Number of children: Not on file  . Years of education: Not on file  . Highest education level: Not on file  Occupational History  . Not on file  Tobacco Use  . Smoking status: Former Smoker    Types: Pipe    Quit date: 05/16/1998    Years since quitting: 21.4  . Smokeless tobacco: Former Network engineer and Sexual Activity  . Alcohol use: No  . Drug use: No  . Sexual activity: Never  Other Topics Concern  . Not on file  Social History Narrative  . Not on file   Social Determinants of Health   Financial Resource Strain:   . Difficulty of Paying  Living Expenses:   Food Insecurity:   . Worried About Charity fundraiser in the Last Year:   . Arboriculturist in the Last Year:   Transportation Needs:   . Film/video editor (Medical):   Marland Kitchen Lack of Transportation (Non-Medical):   Physical Activity:   . Days of Exercise per Week:   . Minutes of Exercise per Session:   Stress:   . Feeling of Stress :   Social Connections:   . Frequency of Communication with Friends and Family:   . Frequency of Social Gatherings with Friends and Family:   . Attends Religious Services:   . Active Member of Clubs or Organizations:   . Attends Archivist Meetings:   Marland Kitchen Marital Status:     reports that he quit smoking about 21 years ago. His smoking use included pipe. He has quit using smokeless tobacco. He reports that he does not drink alcohol or use  drugs.  Functional Status Survey:    Family History  Problem Relation Age of Onset  . Coronary artery disease Other   . Hypertension Other     Health Maintenance  Topic Date Due  . INFLUENZA VACCINE  12/15/2019  . TETANUS/TDAP  05/03/2021  . COVID-19 Vaccine  Completed  . PNA vac Low Risk Adult  Completed    Allergies  Allergen Reactions  . Atorvastatin Other (See Comments)    Unknown reaction  . Colesevelam Other (See Comments)    Unknown reaction  . Simvastatin Other (See Comments)    Unknown reaction  . Sulfa Antibiotics Other (See Comments)    Unknown reaction    Outpatient Encounter Medications as of 10/08/2019  Medication Sig  . acetaminophen (TYLENOL) 325 MG tablet Take 1-2 tablets (325-650 mg total) by mouth every 4 (four) hours as needed for mild pain.  Marland Kitchen amantadine (SYMMETREL) 50 MG/5ML solution Take 10 mLs (100 mg total) by mouth 2 (two) times daily.  Marland Kitchen atenolol (TENORMIN) 25 MG tablet Take 1 tablet (25 mg total) by mouth daily.  Marland Kitchen escitalopram (LEXAPRO) 5 MG tablet Take 1 tablet (5 mg total) by mouth at bedtime.  Marland Kitchen ezetimibe (ZETIA) 10 MG tablet Take 1 tablet  (10 mg total) by mouth daily.  Marland Kitchen lactulose (CHRONULAC) 10 GM/15ML solution Take 30 mLs (20 g total) by mouth 2 (two) times daily as needed for mild constipation.  . pantoprazole sodium (PROTONIX) 40 mg/20 mL PACK Take 20 mLs (40 mg total) by mouth at bedtime.  . senna-docusate (SENOKOT-S) 8.6-50 MG tablet Take 2 tablets by mouth 2 (two) times daily.  . Triamcinolone Acetonide (TRIAMCINOLONE 0.1 % CREAM : EUCERIN) CREA Apply 1 application topically 2 (two) times daily.  . [DISCONTINUED] methylphenidate (RITALIN) 5 MG tablet Take 1 tablet (5 mg total) by mouth 2 (two) times daily with breakfast and lunch.   No facility-administered encounter medications on file as of 10/08/2019.    Review of Systems  Constitutional: Negative for chills, fever and malaise/fatigue.  HENT: Negative for congestion and sore throat.        Dysphagia  Eyes: Negative for blurred vision.       Gaze preference  Respiratory: Negative for cough and shortness of breath.   Cardiovascular: Negative for chest pain, palpitations and leg swelling.  Gastrointestinal: Negative for abdominal pain, blood in stool, constipation and melena.  Genitourinary: Negative for dysuria.  Musculoskeletal: Negative for falls.  Skin: Negative for itching and rash.  Neurological: Positive for sensory change, speech change and focal weakness. Negative for loss of consciousness and headaches.  Psychiatric/Behavioral: Negative for depression and memory loss. The patient is not nervous/anxious and does not have insomnia.     Vitals:   10/08/19 1656  BP: 135/72  Pulse: 72  Temp: (!) 97.1 F (36.2 C)  Weight: 190 lb (86.2 kg)  Height: 5\' 11"  (1.803 m)   Body mass index is 26.5 kg/m. Physical Exam Vitals reviewed.  Constitutional:      General: He is not in acute distress.    Appearance: He is obese. He is not ill-appearing or toxic-appearing.  HENT:     Head: Normocephalic and atraumatic.     Right Ear: External ear normal.     Left  Ear: External ear normal.     Nose: Nose normal.     Mouth/Throat:     Pharynx: Oropharynx is clear.  Eyes:     Conjunctiva/sclera: Conjunctivae normal.     Pupils: Pupils are  equal, round, and reactive to light.  Cardiovascular:     Rate and Rhythm: Normal rate and regular rhythm.     Pulses: Normal pulses.     Heart sounds: Murmur present.  Pulmonary:     Effort: Pulmonary effort is normal.     Breath sounds: Normal breath sounds.  Abdominal:     General: Bowel sounds are normal. There is no distension.     Palpations: Abdomen is soft.     Tenderness: There is no abdominal tenderness. There is no guarding or rebound.  Musculoskeletal:        General: No swelling or tenderness.     Right lower leg: No edema.     Left lower leg: No edema.  Skin:    General: Skin is warm and dry.     Capillary Refill: Capillary refill takes less than 2 seconds.  Neurological:     Mental Status: He is alert.     Cranial Nerves: Cranial nerve deficit present.     Sensory: Sensory deficit present.     Motor: Weakness present.     Coordination: Coordination abnormal.     Gait: Gait abnormal.     Comments: Appears to be oriented x 3, but challenges communicating  Psychiatric:        Mood and Affect: Mood normal.        Behavior: Behavior normal.     Comments: Very pleasant, talking away though I could only understand portions of what he was saying--partially due to aphasia, partially due to dysarthria   Labs reviewed: Basic Metabolic Panel: Recent Labs    09/30/19 0502 10/04/19 0735 10/07/19 0539  NA 139 140 139  K 4.0 3.7 3.4*  CL 106 104 102  CO2 25 28 27   GLUCOSE 110* 102* 90  BUN 9 13 14   CREATININE 1.25* 1.22 1.16  CALCIUM 8.8* 9.2 9.2   Liver Function Tests: Recent Labs    09/04/19 1300 09/05/19 0812 09/10/19 0610  AST 35 21 19  ALT 19 15 20   ALKPHOS 46 43 37*  BILITOT 1.3* 1.4* 1.4*  PROT 6.9 6.5 5.7*  ALBUMIN 3.8 3.3* 2.7*   No results for input(s): LIPASE, AMYLASE  in the last 8760 hours. No results for input(s): AMMONIA in the last 8760 hours. CBC: Recent Labs    09/23/19 0534 09/23/19 0534 09/30/19 0502 09/30/19 0502 10/04/19 0735 10/05/19 0545 10/07/19 0539  WBC 7.5   < > 6.6   < > 7.9 7.3 6.9  NEUTROABS 4.5  --  4.1  --   --   --  3.9  HGB 15.9   < > 15.0   < > 16.3 15.4 16.2  HCT 47.8   < > 45.2   < > 48.5 46.2 49.2  MCV 91.4   < > 92.4   < > 91.9 93.5 93.5  PLT 122*   < > 97*   < > 83* 83* 78*   < > = values in this interval not displayed.   Cardiac Enzymes: No results for input(s): CKTOTAL, CKMB, CKMBINDEX, TROPONINI in the last 8760 hours. BNP: Invalid input(s): POCBNP Lab Results  Component Value Date   HGBA1C 5.9 (H) 09/04/2019   Lab Results  Component Value Date   TSH 3.38 05/20/2019   No results found for: VITAMINB12 No results found for: FOLATE No results found for: IRON, TIBC, FERRITIN  Imaging and Procedures obtained prior to SNF admission: DG Chest 2 View  Result Date: 09/10/2019 CLINICAL DATA:  Fever EXAM: CHEST - 2 VIEW COMPARISON:  09/04/2019 FINDINGS: Prior CABG and aortic valve replacement. The heart size and mediastinal contours are stable. Low lung volumes. Increased interstitial markings within the left lung base, slightly progressed from prior. No pneumothorax. IMPRESSION: Increased interstitial markings within the left lung base, slightly progressed from prior. Findings could represent atelectasis versus pneumonia. Electronically Signed   By: Davina Poke D.O.   On: 09/10/2019 16:33   DG Abd 1 View  Result Date: 09/10/2019 CLINICAL DATA:  Abdominal distension. EXAM: ABDOMEN - 1 VIEW COMPARISON:  None. FINDINGS: The bowel gas pattern is normal. Residual contrast is noted in the colon. No radio-opaque calculi or other significant radiographic abnormality are seen. IMPRESSION: No evidence of bowel obstruction or ileus. Electronically Signed   By: Marijo Conception M.D.   On: 09/10/2019 16:32   CT HEAD WO  CONTRAST  Result Date: 09/10/2019 CLINICAL DATA:  Follow-up left basal ganglia hemorrhage. Increased lethargy. EXAM: CT HEAD WITHOUT CONTRAST TECHNIQUE: Contiguous axial images were obtained from the base of the skull through the vertex without intravenous contrast. COMPARISON:  09/04/2019 FINDINGS: Brain: Slight enlargement of the left basal ganglia intraparenchymal hemorrhage, measuring 4.4 x 2.5 x 3.6 cm (volume = 21 cm^3) today compared with 16 cc volume 6 days ago. Additionally, there is intraventricular penetration, having breach the atrium of the left lateral ventricle, with a small amount of blood dependent in both occipital horns. Ventricular size remains stable however without hydrocephalus. Surrounding edema is slightly more pronounced. One could question if there has been extension of infarction more anteriorly within the basal ganglia and external capsule or if this relates to edema from the hemorrhage. Other small-vessel ischemic changes throughout brain appear stable. No new large vessel infarction. No extra-axial collection. Vascular: There is atherosclerotic calcification of the major vessels at the base of the brain. Skull: Negative Sinuses/Orbits: Clear/normal Other: None IMPRESSION: Slight enlargement of the left basal ganglia intraparenchymal hemorrhage when compared to the study of 6 days ago. Previous volume was calculated at 16 cc. Today calculated at 21 cc. There is also intraventricular penetration with a small amount of blood dependent in the occipital horns of both lateral ventricles but no hydrocephalus. Slightly more surrounding edema as expected. Some edema extends anterior into the basal ganglia and external capsule region, which could either be extension of edema or slight extension of the infarction itself. These results will be called to the ordering clinician or representative by the Radiologist Assistant, and communication documented in the PACS or [employer].  Electronically Signed   By: Nelson Chimes M.D.   On: 09/10/2019 16:57    Assessment/Plan 1. Nontraumatic subcortical hemorrhage of left cerebral hemisphere [facility]) -unfortunately has severe dysarthria, aphasia and dysphagia, weakness -here for continued therapy--PT, OT and ST  2. Dysphagia, post-stroke -cont ST here and modified diet as in hpi  3. Global aphasia -cont ST here, may need communication board  4. 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 Mass of heart valve -longstanding and followed by his cardiologist  6. Thrombocytopenia ([place]) -f/u cbc per facility protocol, felt to contribute to his ICH Lab Results  Component Value Date   PLT 78 (L) 10/07/2019   7. Aspiration pneumonia due to gastric secretions, unspecified laterality, unspecified part of lung ([place]) -s/p abx during hospitalization, lungs clear now, follow modified diet and aspiration precautions as per ST   8. Depression, major, single episode, moderate ([place]) -continues on amantadine for motivation along  with ritalin as well as lexapro   9. DNR (do not resuscitate) - Do not attempt resuscitation (DNR)  10. Essential hypertension -bp satisfactory at this time on atenolol  Family/ staff Communication:   Labs/tests ordered: cbc, bmp  Linnie Mcglocklin L. Stefan Markarian, D.O. [facility] [address], [place] [address] Cell Phone (Mon-Fri 8am-5pm):  [phone] On Call:  [phone] & follow prompts after 5pm & weekends Office Phone:  [phone] Office Fax:  [phone]

## 2019-10-09 LAB — COMPREHENSIVE METABOLIC PANEL
Calcium: 9.5 (ref 8.7–10.7)
GFR calc Af Amer: 69.2
GFR calc non Af Amer: 59.71

## 2019-10-09 LAB — BASIC METABOLIC PANEL WITH GFR
BUN: 16 (ref 4–21)
CO2: 21 (ref 13–22)
Chloride: 104 (ref 99–108)
Creatinine: 1.1 (ref 0.6–1.3)
Glucose: 100
Potassium: 4.7 (ref 3.4–5.3)
Sodium: 139 (ref 137–147)

## 2019-10-09 LAB — CBC: RBC: 5.35 — AB (ref 3.87–5.11)

## 2019-10-09 LAB — CBC AND DIFFERENTIAL
HCT: 50 (ref 41–53)
Hemoglobin: 16.8 (ref 13.5–17.5)
Platelets: 94 — AB (ref 150–399)
WBC: 6.7

## 2019-10-10 ENCOUNTER — Other Ambulatory Visit: Payer: Self-pay | Admitting: *Deleted

## 2019-10-10 NOTE — Patient Outreach (Signed)
Late entry for 10/09/19  Screened for potential [facility] Care Management needs as a benefit of  [employer].  Member is receiving skilled therapy at [facility].  Writer attended telephonic interdisciplinary team meeting to assess for disposition needs and transition plan for resident.   Facility reports member is from home alone. Has supportive daughter. Facility reports member is max to dependent with therapy. Has weakness and hemiplegia. Transition plans pending.   Will continue to follow for transition plans and potential [facility] needs.   [name], MSN-Ed, RN,BSN [place] Acute Care Coordinator [phone] [facility]) [phone]  (Toll free office)

## 2019-10-15 ENCOUNTER — Other Ambulatory Visit: Payer: Self-pay | Admitting: Internal Medicine

## 2019-10-16 ENCOUNTER — Other Ambulatory Visit: Payer: Self-pay | Admitting: Internal Medicine

## 2019-10-16 MED ORDER — METHYLPHENIDATE HCL 5 MG PO TABS: 5.0000 mg | ORAL_TABLET | Freq: Two times a day (BID) | ORAL | 0 refills | Status: DC

## 2019-10-17 DIAGNOSIS — I69922 Dysarthria following unspecified cerebrovascular disease: Secondary | ICD-10-CM | POA: Insufficient documentation

## 2019-10-17 DIAGNOSIS — I5189 Other ill-defined heart diseases: Secondary | ICD-10-CM | POA: Insufficient documentation

## 2019-10-17 DIAGNOSIS — F321 Major depressive disorder, single episode, moderate: Secondary | ICD-10-CM | POA: Insufficient documentation

## 2019-10-18 LAB — BASIC METABOLIC PANEL
BUN: 14 (ref 4–21)
CO2: 21 (ref 13–22)
Chloride: 103 (ref 99–108)
Creatinine: 1.1 (ref 0.6–1.3)
Glucose: 110
Potassium: 4.6 (ref 3.4–5.3)
Sodium: 139 (ref 137–147)

## 2019-10-18 LAB — COMPREHENSIVE METABOLIC PANEL
Calcium: 9.8 (ref 8.7–10.7)
GFR calc Af Amer: 69.99
GFR calc non Af Amer: 60.39

## 2019-10-18 LAB — CBC AND DIFFERENTIAL
HCT: 50 (ref 41–53)
Hemoglobin: 17.4 (ref 13.5–17.5)
Platelets: 95 — AB (ref 150–399)
WBC: 7.5

## 2019-10-18 LAB — CBC: RBC: 5.53 — AB (ref 3.87–5.11)

## 2019-10-23 ENCOUNTER — Other Ambulatory Visit: Payer: Self-pay | Admitting: *Deleted

## 2019-10-23 NOTE — Patient Outreach (Signed)
Screened for potential [facility] Care Management needs as a benefit of  [employer].  David [name] is currently receiving skilled therapy at [facility].   Writer attended telephonic interdisciplinary team meeting to assess for disposition needs and transition plan for resident.   Facility reports member remains max/dependent with therapy. Making small gains with therapy. States family still wants member to return home at discharge.  Will continue to follow transition plans and for potential [facility] Care Management needs while member resides in SNF.    [name], MSN-Ed, RN,BSN [place] Acute Care Coordinator [phone] [facility]) [phone]  (Toll free office)

## 2019-10-24 ENCOUNTER — Non-Acute Institutional Stay (SKILLED_NURSING_FACILITY): Payer: Medicare Other | Admitting: Internal Medicine

## 2019-10-24 DIAGNOSIS — I10 Essential (primary) hypertension: Secondary | ICD-10-CM | POA: Diagnosis not present

## 2019-10-24 DIAGNOSIS — I610 Nontraumatic intracerebral hemorrhage in hemisphere, subcortical: Secondary | ICD-10-CM

## 2019-10-24 DIAGNOSIS — D696 Thrombocytopenia, unspecified: Secondary | ICD-10-CM

## 2019-10-25 ENCOUNTER — Encounter: Payer: Self-pay | Admitting: Internal Medicine

## 2019-10-25 NOTE — Progress Notes (Signed)
This is an acute visit.  Level care skilled.  [facility]  Chief complaint.  Acute visit routine follow-up status post left intracranial hemorrhage.  History of present illness.  Patient is a pleasant 86 year old male seen today for regular follow-up after recent admission to skilled nursing-after hospitalization for a left intracranial hemorrhage.  Patient has a history previously of coronary artery disease COPD hypertension hyperlipidemia impaired glucose tolerance and a bioprosthetic aortic valve replacement-.  Apparently he was found at home lying between 2 beds.  In the hospital the MRI/MRA showed stable acute left basal ganglia hemorrhage with remote bilateral basal ganglia the coronary infarcts and advanced white matter disease.  The bleed was thought due to small vessel disease versus thrombocytopenia.  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  He had no clinical signs of endocarditis.  He did go to rehab at [facility].  He had about a lethargy and a low-grade fever and abdominal distention-he was treated for constipation and CT showed slight increased edema and bleeding of the left basal ganglia-also he had some worsening renal function so he was given IV fluids.  He also was treated with IV Zosyn secondary to concerns of aspiration pneumonia he was made n.p.o.  Follow-up imaging was negative and antibiotic was stopped.  Repeat MBS was done and is started on dysphagia 1 honey thick liquids.  Diet eventually was changed to dysphagia 2 with nectar thickened liquids and he was able to maintain hydration with water and ice chips.  He has been evaluated by speech therapy here and now is on mechanical soft with thin liquids  Blood pressure was running high so low-dose Tenormin was added.  He also had depression and was started on Lexapro-Ritalin was  added for motivation.  Amantadine also was added.  He did have some gradual improvement and he was admitted here for rehab.  He will be followed by neurology.  Nursing does not report any acute issues he is resting in bed comfortably today-vital signs appear to be stable.  He does not speak much but he is alert makes eye contact and does speak some he is pleasant and cooperative   Past Medical History:  Diagnosis Date   CAD (coronary artery disease)    intolerance of statins   COPD (chronic obstructive pulmonary disease) ([place])    Current use of long term anticoagulation    GERD (gastroesophageal reflux disease)    Hyperlipidemia    Hypertension    Impaired glucose tolerance 02/14/2011   LBP (low back pain)    OA (osteoarthritis)    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        Past Surgical History:  Procedure Laterality Date   AORTIC VALVE REPLACEMENT  01/15/1999   bovine   CARDIAC CATHETERIZATION  10/05/1998   Recommend CABG revascularizaition   CARDIAC CATHETERIZATION  06/11/1999   Continue medical therapy   CARDIAC CATHETERIZATION  01/08/2007   Patent grafts, recommend medical therapy   CARDIOVASCULAR STRESS TEST  04/22/2010   Small lateral defect which is partially reversible, no ischemic EKG changes were noted.   CAROTID DOPPLER  04/22/2010   Bilateral ICAs-no evidence of diametere reduction, significant tortuosity, or any other vascular abnormality.   CORONARY ARTERY BYPASS GRAFT  10/09/1998   LIMA to LAD, SVG to diag, SVG sequential to 2 marginals, SVG sequential to PDA &  PLA. CE#25 porcine AVR.   TRANSTHORACIC ECHOCARDIOGRAM  05/14/2012   EF 50-55%, bioprosthesis of the aortic valve with well preserved LV function, moderate concentric [place]    Social History        Socioeconomic History   Marital status: Widowed    Spouse name: Not on file   Number of children: Not on file   Years of education:  Not on file   Highest education level: Not on file  Occupational History   Not on file  Tobacco Use   Smoking status: Former Smoker    Types: Pipe    Quit date: 05/16/1998    Years since quitting: 21.4   Smokeless tobacco: Former Network engineer and Sexual Activity   Alcohol use: No   Drug use: No   Sexual activity: Never  Other Topics Concern   Not on file  Social History Narrative   Not on file   Social Determinants of Health      Financial Resource Strain:    Difficulty of Paying Living Expenses:   Food Insecurity:    Worried About Charity fundraiser in the Last Year:    Arboriculturist in the Last Year:   Transportation Needs:    Film/video editor (Medical):    Lack of Transportation (Non-Medical):   Physical Activity:    Days of Exercise per Week:    Minutes of Exercise per Session:   Stress:    Feeling of Stress :   Social Connections:    Frequency of Communication with Friends and Family:    Frequency of Social Gatherings with Friends and Family:    Attends Religious Services:    Active Member of Clubs or Organizations:    Attends Archivist Meetings:    Marital Status:     reports that he quit smoking about 21 years ago. His smoking use included pipe. He has quit using smokeless tobacco. He reports that he does not drink alcohol or use drugs.  Functional Status Survey:       Family History  Problem Relation Age of Onset   Coronary artery disease Other    Hypertension Other         Health Maintenance  Topic Date Due   INFLUENZA VACCINE  12/15/2019   TETANUS/TDAP  05/03/2021   COVID-19 Vaccine  Completed   PNA vac Low Risk Adult  Completed         Allergies  Allergen Reactions   Atorvastatin Other (See Comments)    Unknown reaction   Colesevelam Other (See Comments)    Unknown reaction   Simvastatin Other (See Comments)    Unknown reaction   Sulfa Antibiotics Other (See  Comments)    Unknown reaction      MEDICATIONS     Medication Sig   acetaminophen (TYLENOL) 325 MG tablet Take 1-2 tablets (325-650 mg total) by mouth every 4 (four) hours as needed for mild pain.   amantadine (SYMMETREL) 50 MG/5ML solution Take 10 mLs (100 mg total) by mouth 2 (two) times daily.   atenolol (TENORMIN) 25 MG tablet Take 1 tablet (25 mg total) by mouth daily.   escitalopram (LEXAPRO) 5 MG tablet Take 1 tablet (5 mg total) by mouth at bedtime.   ezetimibe (ZETIA) 10 MG tablet Take 1 tablet (10 mg total) by mouth daily.   lactulose (CHRONULAC) 10 GM/15ML solution Take 30 mLs (20 g total) by mouth 2 (two) times daily as needed for mild constipation.  pantoprazole sodium (PROTONIX) 40 mg/20 mL PACK Take 20 mLs (40 mg total) by mouth at bedtime.   senna-docusate (SENOKOT-S) 8.6-50 MG tablet Take 2 tablets by mouth 2 (two) times daily.   Triamcinolone Acetonide (TRIAMCINOLONE 0.1 % CREAM : EUCERIN) CREA Apply 1 application topically 2 (two) times daily.    methylphenidate (RITALIN) 5 MG tablet Take 1 tablet (5 mg total) by mouth 2 (two) times daily with breakfast and lunch.   Review of systems.  This is limited since patient does not speak much-nursing does not report any issues he does have some history of dysphagia but apparently this is stable possibly improved.  No coughing or complaints of shortness of breath or chest pain has been noted he is not complaining of that today at bedside.    Physical exam.  Temperature is 97.4 pulse 70 respirations 18 blood pressure 124/66.  In general this is a pleasant elderly male in no distress he is resting comfortably in bed he is bright and alert but does not speak much.  His skin is warm and dry.  Eyes visual acuity appears to be intact sclera and conjunctive are clear pupils appear to be reactive.  Oropharynx is clear mucous membranes appear to be moist tongue appears to be midline.  Chest is clear to  auscultation there is no labored breathing there is somewhat poor effort.  Heart is regular rate and rhythm with some irregular beats heart sounds are somewhat distant he has trace lower extremity edema.-He does have a history of a murmur  Abdomen soft is soft does not appear to be tender there are positive bowel sounds.  Musculoskeletal Limited exam since he is in bed but appears to have weakness generalized most prominently of his lower extremities-  Cannot really appreciate any true lateralizing findings.  Neurologic as noted above-he does have weakness he does speak some but he does have some dysarthria complicated with aphasia but he is pleasant and cooperative.  Psych he is pleasant and appropriate   Labs.  October 18, 2019.  WBC 7.5 hemoglobin 17.4 platelets 95,000.  Sodium 139 potassium 4.6 BUN 14.1 creatinine 1.05   09/23/19 0534 09/23/19 0534 09/30/19 0502 09/30/19 0502 10/04/19 0735 10/05/19 0545 10/07/19 0539  WBC 7.5   < > 6.6   < > 7.9 7.3 6.9  NEUTROABS 4.5  --  4.1  --   --   --  3.9  HGB 15.9   < > 15.0   < > 16.3 15.4 16.2  HCT 47.8   < > 45.2   < > 48.5 46.2 49.2  MCV 91.4   < > 92.4   < > 91.9 93.5 93.5  PLT 122*   < > 97*   < > 83* 83* 78*   < > = values in this interval not displayed.     Cardiac Enzymes: Recent Labs (within last 365 days)  No results for input(s): CKTOTAL, CKMB, CKMBINDEX, TROPONINI in the last 8760 hours.   BNP: Recent Labs (within last 365 days)  Invalid input(s): POCBNP   Recent Labs       Lab Results  Component Value Date   HGBA1C 5.9 (H) 09/04/2019     Recent Labs  Lab Results  Component Value Date   TSH 3.38 05/20/2019     Recent Labs  No results found for: VITAMINB12   Recent Labs  No results found for: FOLATE   Recent Labs   Assessment and plan.  1.  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  His diet currently is mechanical soft with thin liquids  Nursing does not report any issues but he does continue with challenges here-at this point appears to be comfortable however and very pleasant and cooperative.  2.  Thrombocytopenia-this was thought to be contributing to his ICH-platelet count appears to be at baseline at 95,000 on lab done on June 4 will monitor this periodically.  He does not show evidence of increased bruising or bleeding beyond baseline.  3.  History of aspiration pneumonia-this point does not really show evidence of this no sign of labored breathing or overt congestion but this will need to be monitored.  4.  History of depression-he is on amantadine as well as Ritalin and Lexapro at this point will monitor.  5.  History of hypertension this appears to be stable on atenolol 25 mg a day-blood pressures systolically appear to be mainly in the 1 teens to 130s-he had 165 earlier today but apparently he was trying to have a bowel movement at that time and apparently resolved once he was able to have success.  [id number]

## 2019-10-28 ENCOUNTER — Non-Acute Institutional Stay (SKILLED_NURSING_FACILITY): Payer: Medicare Other | Admitting: Internal Medicine

## 2019-10-28 DIAGNOSIS — I610 Nontraumatic intracerebral hemorrhage in hemisphere, subcortical: Secondary | ICD-10-CM | POA: Diagnosis not present

## 2019-10-28 DIAGNOSIS — D696 Thrombocytopenia, unspecified: Secondary | ICD-10-CM

## 2019-10-28 DIAGNOSIS — I10 Essential (primary) hypertension: Secondary | ICD-10-CM

## 2019-10-28 DIAGNOSIS — H6123 Impacted cerumen, bilateral: Secondary | ICD-10-CM

## 2019-10-28 LAB — CBC AND DIFFERENTIAL
HCT: 49 (ref 41–53)
Hemoglobin: 16.6 (ref 13.5–17.5)
Platelets: 104 — AB (ref 150–399)
WBC: 7

## 2019-10-28 LAB — CBC: RBC: 5.3 — AB (ref 3.87–5.11)

## 2019-10-28 NOTE — Progress Notes (Signed)
This is an acute visit.  Level care skilled.  [facility]  Chief complaint acute visit secondary to ear issues-also family questions about general status.  History of present illness.  Patient is a pleasant 86 year old David [name] seen today for apparent complaints of ear whooshing sound-as well as general questions family has about patient's status.  He is here status post hospitalization for a left intracranial hemorrhage  Patient has a history of coronary artery disease as well as COPD hypertension hyperlipidemia impaired glucose tolerance as well as a bioprosthetic aortic valve replacement.  In the hospital the MRI/MRA showed stable acute left basal ganglia hemorrhage with remote bilateral basal ganglia the coronary infarcts and advanced white matter disease.  The bleed was thought due to small vessel disease versus thrombocytopenia.  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  He had no clinical signs of endocarditis.  He did go to rehab at [facility].  He had about a lethargy and a low-grade fever and abdominal distention-he was treated for constipation and CT showed slight increased edema and bleeding of the left basal ganglia-also he had some worsening renal function so he was given IV fluids.  He also was treated with IV Zosyn secondary to concerns of aspiration pneumonia he was made n.p.o.  Follow-up imaging was negative and antibiotic was stopped.  Repeat MBS was done and is started on dysphagia 1 honey thick liquids.  Diet eventually was changed to dysphagia 2 with nectar thickened liquids and he was able to maintain hydration with water and ice chips.  He has been evaluated by speech therapy here and now is on mechanical soft with thin liquids  Blood pressure was running high so low-dose Tenormin was added.  He also had depression and was  started on Lexapro-Ritalin was added for motivation.  Amantadine also was added.   His family is noted he has complained of some ear issues-apparently some sound in his ears and I am following up on this.  Today he is not really complaining of ear issues although he is somewhat of a poor historian.  Family also would like an update on his general status.  I did see him late last week he appeared to be stable he was not talking a whole lot but today he is actually up in the chair he appears to be more alert he is talking more still has some dysarthria but actually appears to be doing better.  Labs today showed a chronic thrombocytopenia with 104,000 which is relative baseline.  Labs last week showed stability of renal function and electrolytes.  He appears to be doing well today in regards to his other issues..  He does not really show evidence of aspiration pneumonia is not coughing febrile I cannot really appreciate significant chest congestion.  In regards to depression again he is on the amantadine and Ritalin and Lexapro he appears to be in good spirits today.  Blood pressure also appears to be stable--systolic is 865 today but this appears to be on the higher range of his normal baseline-.  In regards to impaired glucose control CBG was 784 today-metabolic panels his blood sugars appear to be in the low 100s-most recently 101 10.  I did speak with his daughter who was in the room today and did relay my impression-.  I also looked at his ears and appears he has a significant amount  of wax and will start with Debrox.  Past Medical History:  Diagnosis Date  . CAD (coronary artery disease)    intolerance of statins  . COPD (chronic obstructive pulmonary disease) ([place])   . Current use of long term anticoagulation   . GERD (gastroesophageal reflux disease)   . Hyperlipidemia   . Hypertension   . Impaired glucose tolerance 02/14/2011  . LBP (low back pain)   . OA  (osteoarthritis)   . 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        Past Surgical History:  Procedure Laterality Date  . 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 CARDIAC CATHETERIZATION  10/05/1998   Recommend CABG revascularizaition  . CARDIAC CATHETERIZATION  06/11/1999   Continue medical therapy  . CARDIAC CATHETERIZATION  01/08/2007   Patent grafts, recommend medical therapy  . CARDIOVASCULAR STRESS TEST  04/22/2010   Small lateral defect which is partially reversible, no ischemic EKG changes were noted.  Marland Kitchen CAROTID DOPPLER  04/22/2010   Bilateral ICAs-no evidence of diametere reduction, significant tortuosity, or any other vascular abnormality.  . CORONARY ARTERY BYPASS GRAFT  10/09/1998   LIMA to LAD, SVG to diag, SVG sequential to 2 marginals, SVG sequential to PDA & PLA. CE#25 porcine AVR.  Marland Kitchen TRANSTHORACIC ECHOCARDIOGRAM  05/14/2012   EF 50-55%, bioprosthesis of the aortic valve with well preserved LV function, moderate concentric [place]    Social History        Socioeconomic History  . Marital status: Widowed    Spouse name: Not on file  . Number of children: Not on file  . Years of education: Not on file  . Highest education level: Not on file  Occupational History  . Not on file  Tobacco Use  . Smoking status: Former Smoker    Types: Pipe    Quit date: 05/16/1998    Years since quitting: 21.4  . Smokeless tobacco: Former Network engineer and Sexual Activity  . Alcohol use: No  . Drug use: No  . Sexual activity: Never  Other Topics Concern  . Not on file  Social History Narrative  . Not on file   Social Determinants of Health      Financial Resource Strain:   . Difficulty of Paying Living Expenses:   Food Insecurity:   . Worried About Charity fundraiser in the Last Year:   . Arboriculturist in the Last Year:   Transportation Needs:   . Film/video editor (Medical):   Marland Kitchen Lack of Transportation  (Non-Medical):   Physical Activity:   . Days of Exercise per Week:   . Minutes of Exercise per Session:   Stress:   . Feeling of Stress :   Social Connections:   . Frequency of Communication with Friends and Family:   . Frequency of Social Gatherings with Friends and Family:   . Attends Religious Services:   . Active Member of Clubs or Organizations:   . Attends Archivist Meetings:   Marland Kitchen Marital Status:   reports that he quit smoking about 21 years ago. His smoking use included pipe. He has quit using smokeless tobacco. He reports that he does not drink alcohol or use drugs.  Functional Status Survey:       Family History  Problem Relation Age of Onset  . Coronary artery disease Other   . Hypertension Other         Health Maintenance  Topic Date Due  . INFLUENZA VACCINE  12/15/2019  . TETANUS/TDAP  05/03/2021  . COVID-19 Vaccine  Completed  . PNA vac Low Risk Adult  Completed         Allergies  Allergen Reactions  . Atorvastatin Other (See Comments)    Unknown reaction  . Colesevelam Other (See Comments)    Unknown reaction  . Simvastatin Other (See Comments)    Unknown reaction  . Sulfa Antibiotics Other (See Comments)    Unknown reaction      MEDICATIONS     Medication Sig  . acetaminophen (TYLENOL) 325 MG tablet Take 1-2 tablets (325-650 mg total) by mouth every 4 (four) hours as needed for mild pain.  Marland Kitchen amantadine (SYMMETREL) 50 MG/5ML solution Take 10 mLs (100 mg total) by mouth 2 (two) times daily.  Marland Kitchen atenolol (TENORMIN) 25 MG tablet Take 1 tablet (25 mg total) by mouth daily.  Marland Kitchen escitalopram (LEXAPRO) 5 MG tablet Take 1 tablet (5 mg total) by mouth at bedtime.  Marland Kitchen ezetimibe (ZETIA) 10 MG tablet Take 1 tablet (10 mg total) by mouth daily.  Marland Kitchen lactulose (CHRONULAC) 10 GM/15ML solution Take 30 mLs (20 g total) by mouth 2 (two) times daily as needed for mild constipation.  . pantoprazole sodium (PROTONIX) 40 mg/20  mL PACK Take 20 mLs (40 mg total) by mouth at bedtime.  . senna-docusate (SENOKOT-S) 8.6-50 MG tablet Take 2 tablets by mouth 2 (two) times daily.  . Triamcinolone Acetonide (TRIAMCINOLONE 0.1 % CREAM : EUCERIN) CREA Apply 1 application topically 2 (two) times daily.  .  methylphenidate (RITALIN) 5 MG tablet Take 1 tablet (5 mg total) by mouth 2 (two) times daily with breakfast and lunch.    Review of systems.  This is somewhat limited since patient is a poor historian provided by nursing as well.  General no complaints of fever or chills.  Skin does not complain of rashes or itching.  Head ears eyes nose mouth and throat is not complaining of difficulty swallowing or visual changes-.  Respiratory does not complain of being short of breath or having a cough.  Cardiac no complaints of chest pain or increasing edema.  GI is not complaining of abdominal pain nausea vomiting diarrhea constipation.  GU does not complain of dysuria.  Musculoskeletal is not really complaining of any joint pain today appears to be comfortable sitting in his chair.  Neurologic again is positive for intracranial hemorrhage with some dysarthria he does have general weakness.  And aphasia.  Psych he continues to be pleasant and appropriate   Physical exam.  Temperature 96.9 pulse 89 respirations 20 blood pressure 141/66.  In general this is a pleasant elderly David [name] in no distress sitting comfortably in his chair he appears to be more bright and alert.  His skin is warm and dry.  Eyes visual acuity appears to be grossly intact.  Sclera and conjunctive are clear-was easily able to tell me how many fingers I was holding up-extraocular movements appear to be intact.  Ears he does appear to have some wax buildup of his ears bilaterally left greater than right tympanic membrane was visualized but obscured appear to be pearly gray from what I can make out  Oropharynx is clear mucous membranes moist.  Chest  is clear to auscultation with somewhat poor respiratory effort there is no labored breathing.  Heart has distant heart sounds from what I could auscultate per radial pulse was largely regular rate and rhythm with some more irregular  beats he has trace lower extremity edema  Abdomen is soft nontender with active bowel sounds.  Musculoskeletal is sitting in the chair appears to be somewhat stronger than when I saw him lying in bed he does have continued weakness.  Neurologic as noted above he is alert appears more talkative but does have some dysarthria.  Psych as noted above he is pleasant and appropriate does follow simple verbal commands he does have some continued aphasia and dysarthria.   Labs.  October 28, 2019.  WBC 7.0 hemoglobin 16.6 platelets 104,000.  October 18, 2019.  WBC 7.5 hemoglobin 17.4 platelets 95,000.  Sodium 139 potassium 4.6 BUN 14.1 creatinine 1.05.  Recent Labs    09/30/19 0502 10/04/19 0735 10/07/19 0539  NA 139 140 139  K 4.0 3.7 3.4*  CL 106 104 102  CO2 25 28 27   GLUCOSE 110* 102* 90  BUN 9 13 14   CREATININE 1.25* 1.22 1.16  CALCIUM 8.8* 9.2 9.2     Liver Function Tests: Recent Labs (within last 365 days)       Recent Labs    09/04/19 1300 09/05/19 0812 09/10/19 0610  AST 35 21 19  ALT 19 15 20   ALKPHOS 46 43 37*  BILITOT 1.3* 1.4* 1.4*  PROT 6.9 6.5 5.7*  ALBUMIN 3.8 3.3* 2.7*     Recent Labs (within last 365 days)  No results for input(s): LIPASE, AMYLASE in the last 8760 hours.   Recent Labs (within last 365 days)  No results for input(s): AMMONIA in the last 8760 hours.   CBC: Recent Labs (within last 365 days)           Recent Labs    09/23/19 0534 09/23/19 0534 09/30/19 0502 09/30/19 0502 10/04/19 0735 10/05/19 0545 10/07/19 0539  WBC 7.5   < > 6.6   < > 7.9 7.3 6.9  NEUTROABS 4.5  --  4.1  --   --   --  3.9  HGB 15.9   < > 15.0   < > 16.3 15.4 16.2  HCT 47.8   < > 45.2   < > David.5 46.2 49.2  MCV 91.4   < > 92.4    < > 91.9 93.5 93.5  PLT 122*   < > 97*   < > 83* 83* 78*   < > = values in this interval not displayed.     Cardiac Enzymes: Recent Labs (within last 365 days)  No results for input(s): CKTOTAL, CKMB, CKMBINDEX, TROPONINI in the last 8760 hours.   BNP: Recent Labs (within last 365 days)  Invalid input(s): POCBNP   Recent Labs  Lab Results  Component Value Date   HGBA1C 5.9 (H) 09/04/2019     Recent Labs   Assessment and plan.  1.  Cerumen impaction-we will treat with Debrox for 3 days 10 drops twice daily and flush with warm water and day for monitor for improvement.  2.  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  He will need continued therapy-I did speak with his daughter in the room about his progress as noted above.  Clinically actually appears to be improving compared to what I saw last week.  Nursing does not report any acute issues.  3.  Thrombocytopenia and labs today show stability with a platelet count of 104,000 we  will continue to monitor periodically he does not show evidence of increased bruising or bleeding from baseline he does have a propensity to bruise secondary to this.  4.  History of aspiration pneumonia does not show evidence of this with any increased cough congestion or labored breathing but this will have to be monitored he is on mechanical soft with thin liquid diet.  5.  History of hypertension he is on atenolol 25 mg a day blood pressures appear to be stable systolic is mildly elevated in the low 140s today but this does not appear to be consistent-- at this point will monitor I do note he had a systolic of 300 at one-point but again this is not persistent.  6.  Constipation apparently this was a issue in the hospital I spoke with nursing and they do not report any  issues here but this will have to be monitored he is not complaining of any abdominal discomfort-she is on lactulose twice daily as needed.  7.  History of depression again he is on the Ritalin amantadine as well as Lexapro-this appears stable he appears to be in good spirits today he is smiling alert up in the chair appears to be enjoyed visiting with his family who is in the room.  [id number]

## 2019-10-29 ENCOUNTER — Encounter: Payer: Self-pay | Admitting: Internal Medicine

## 2019-10-30 ENCOUNTER — Other Ambulatory Visit: Payer: Self-pay | Admitting: *Deleted

## 2019-10-30 ENCOUNTER — Encounter: Payer: Self-pay | Admitting: Physical Medicine & Rehabilitation

## 2019-10-30 ENCOUNTER — Other Ambulatory Visit: Payer: Self-pay | Admitting: Internal Medicine

## 2019-10-30 ENCOUNTER — Encounter
Payer: PRIVATE HEALTH INSURANCE | Attending: Physical Medicine & Rehabilitation | Admitting: Physical Medicine & Rehabilitation

## 2019-10-30 ENCOUNTER — Other Ambulatory Visit: Payer: Self-pay

## 2019-10-30 VITALS — BP 136/79 | HR 80 | Temp 97.2°F | Ht 70.0 in | Wt 190.0 lb

## 2019-10-30 DIAGNOSIS — G8111 Spastic hemiplegia affecting right dominant side: Secondary | ICD-10-CM | POA: Diagnosis not present

## 2019-10-30 DIAGNOSIS — I2510 Atherosclerotic heart disease of native coronary artery without angina pectoris: Secondary | ICD-10-CM

## 2019-10-30 DIAGNOSIS — I610 Nontraumatic intracerebral hemorrhage in hemisphere, subcortical: Secondary | ICD-10-CM | POA: Insufficient documentation

## 2019-10-30 MED ORDER — METHYLPHENIDATE HCL 5 MG PO TABS: 5.0000 mg | ORAL_TABLET | Freq: Two times a day (BID) | ORAL | 0 refills | Status: DC

## 2019-10-30 NOTE — Patient Outreach (Signed)
Screened for potential [facility] Care Management needs as a benefit of  [employer].  Mr. Kohles is currently receiving skilled therapy at [facility].   Writer attended telephonic interdisciplinary team meeting to assess for disposition needs and transition plan for resident.   Facility reports member's family wants member to return home at [facility] dc.   Will plan outreach to daughter/DPR to discuss transition plans and potential [facility] needs. Will continue to follow while member resides in SNF.    [name], MSN-Ed, RN,BSN [place] Acute Care Coordinator [phone] [facility]) [phone]  (Toll free office)

## 2019-10-30 NOTE — Patient Instructions (Signed)
PLEASE FEEL FREE TO CALL OUR OFFICE WITH ANY PROBLEMS OR QUESTIONS ([phone])      

## 2019-10-30 NOTE — Progress Notes (Signed)
Subjective:    Patient ID: David [name], male    DOB: 1923-05-17, 86 y.o.   MRN: [id number]  HPI   David [name] is here in follow-up of his left thalamic hemorrhage with associated right spastic hemiparesis and cognitive linguistic deficits.  He was discharged to [facility] skilled nursing facility after rehab stay as his family could not provide needed care for him.  He is making some gradual progress and is doing some short distance ambulation with the team there.  He is having some issues with constipation.  He is eating better.  Family is concerned that he is not getting enough therapy where he is at.  His daughter's ultimate plan is to get him home.  He asked where he could get more therapy comparatively to where he is currently is.  Otherwise his mood has been more upbeat.  He is more interactive.  He likes to talk.  Pain Inventory Average Pain 0 Pain Right Now 0 My pain is no pain  In the last 24 hours, has pain interfered with the following? General activity no pain Relation with others no pain Enjoyment of life no pain What TIME of day is your pain at its worst? no pain Sleep (in general) Fair  Pain is worse with: no pain Pain improves with: no pain Relief from Meds: no pain  Mobility walk with assistance use a walker ability to climb steps?  no do you drive?  no use a wheelchair needs help with transfers  Function retired I need assistance with the following:  feeding, dressing, bathing, toileting, meal prep, household duties and shopping  Neuro/Psych bowel control problems numbness  Prior Studies Any changes since last visit?  no  Physicians involved in your care Any changes since last visit?  no Primary care David [name] David [name] @ the rehab facility   Family History  Problem Relation Age of Onset  . Coronary artery disease Other   . Hypertension Other    Social History   Socioeconomic History  . Marital status: Widowed    Spouse name: Not  on file  . Number of children: Not on file  . Years of education: Not on file  . Highest education level: Not on file  Occupational History  . Not on file  Tobacco Use  . Smoking status: Former Smoker    Types: Pipe    Quit date: 05/16/1998    Years since quitting: 21.4  . Smokeless tobacco: Former Network engineer  . Vaping Use: Never used  Substance and Sexual Activity  . Alcohol use: No  . Drug use: No  . Sexual activity: Never  Other Topics Concern  . Not on file  Social History Narrative  . Not on file   Social Determinants of Health   Financial Resource Strain:   . Difficulty of Paying Living Expenses:   Food Insecurity:   . Worried About Charity fundraiser in the Last Year:   . Arboriculturist in the Last Year:   Transportation Needs:   . Film/video editor (Medical):   Marland Kitchen Lack of Transportation (Non-Medical):   Physical Activity:   . Days of Exercise per Week:   . Minutes of Exercise per Session:   Stress:   . Feeling of Stress :   Social Connections:   . Frequency of Communication with Friends and Family:   . Frequency of Social Gatherings with Friends and Family:   . Attends Religious Services:   .  Active Member of Clubs or Organizations:   . Attends Archivist Meetings:   Marland Kitchen Marital Status:    Past Surgical History:  Procedure Laterality Date  . 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 CARDIAC CATHETERIZATION  10/05/1998   Recommend CABG revascularizaition  . CARDIAC CATHETERIZATION  06/11/1999   Continue medical therapy  . CARDIAC CATHETERIZATION  01/08/2007   Patent grafts, recommend medical therapy  . CARDIOVASCULAR STRESS TEST  04/22/2010   Small lateral defect which is partially reversible, no ischemic EKG changes were noted.  Marland Kitchen CAROTID DOPPLER  04/22/2010   Bilateral ICAs-no evidence of diametere reduction, significant tortuosity, or any other vascular abnormality.  . CORONARY ARTERY BYPASS GRAFT  10/09/1998   LIMA to LAD, SVG  to diag, SVG sequential to 2 marginals, SVG sequential to PDA & PLA. CE#25 porcine AVR.  Marland Kitchen TRANSTHORACIC ECHOCARDIOGRAM  05/14/2012   EF 50-55%, bioprosthesis of the aortic valve with well preserved LV function, moderate concentric [place]   Past Medical History:  Diagnosis Date  . CAD (coronary artery disease)    intolerance of statins  . COPD (chronic obstructive pulmonary disease) ([place])   . Current use of long term anticoagulation   . GERD (gastroesophageal reflux disease)   . Hyperlipidemia   . Hypertension   . Impaired glucose tolerance 02/14/2011  . LBP (low back pain)   . OA (osteoarthritis)   . 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   BP 136/79   Pulse 80   Temp (!) 97.2 F (36.2 C)   Ht 5\' 10"  (1.778 m) Comment: reported  Wt 190 lb (86.2 kg) Comment: last recorded  SpO2 94%   BMI 27.26 kg/m   Opioid Risk Score:   Fall Risk Score:  `1  Depression screen PHQ 2/9  Depression screen [facility] 2/9 10/30/2019 05/20/2019 12/04/2017 12/02/2016 12/02/2015 07/17/2014  Decreased Interest 0 0 0 0 0 0  Down, Depressed, Hopeless 1 0 0 0 0 0  PHQ - 2 Score 1 0 0 0 0 0  Altered sleeping 1 - - - - -  Tired, decreased energy 0 - - - - -  Change in appetite 1 - - - - -  Feeling bad or failure about yourself  1 - - - - -  Trouble concentrating 3 - - - - -  Moving slowly or fidgety/restless 1 - - - - -  Suicidal thoughts 0 - - - - -  PHQ-9 Score 8 - - - - -  Difficult doing work/chores Not difficult at all - - - - -    Review of Systems  Constitutional: Negative.   HENT: Negative.   Eyes: Negative.   Respiratory: Negative.   Cardiovascular: Negative.   Gastrointestinal: Positive for constipation.  Endocrine: Negative.   Genitourinary: Negative.   Musculoskeletal: Positive for gait problem.  Skin: Negative.   Allergic/Immunologic: Negative.   Neurological: Positive for numbness.  Hematological: Negative.   Psychiatric/Behavioral: Negative.   All other systems reviewed  and are negative.      Objective:   Physical Exam Gen: no distress, normal appearing HEENT: oral mucosa pink and moist, NCAT Cardio: Reg rate Chest: normal effort, normal rate of breathing Abd: soft, non-distended Ext: no edema Skin: intact Neuro: He is very alert.  Makes nice eye contact with me.  Can follow simple commands with extra time.  Patient remains quite dysarthric.  He continues to demonstrate a receptive aphasia with some word salad  although there is some appropriate context within his words.  Sometimes the dysarthria masks his language as well.  He does have trace to 1 out of 5 movement in the right upper extremity but is inconsistent.  Biceps tone is 1+ to 2 out of 4.  Right lower extremity exhibits trace to 1 out of 5 movement as well.  He has some difficulties initiating movement both in the right upper and lower.  Hamstring tone was 2-3 out of 4. Musculoskeletal: Some pain in the right shoulder with range of motion but minimal today. Psych: pleasant, normal affect        Assessment & Plan:  Medical Problem List and Plan: 1.  Functional, mobility and cognitive deficits secondary to left basal ganglia hemorrhage             -CONTINUE with SNF level therapiy to address mobility, self-care, speech and cognition.  Goal will be to transition to home in that point we can move him to outpatient therapy. 2. Chronic LBP/Pain Management:  Tylenol prn. well controlled 3. Mood:    -lexapro--mood much more upbeat overall.  Can continue with this 5 mg dose 4.  Spastic right hemiparesis: We will bring him back in about 4 weeks for Botox 300 units to right hamstrings and potentially the biceps 5. Arousal:             -continue amantadine 100mg  daily             -ritalin   5mg  bid--- we will go ahead and stop Ritalin today as his arousal is improved.                       6. Skin/Wound Care:  Continue local measures.  7. Nutrition:  8. HTN:  Blood pressure currently under control       9. H/o COPD: Stable without meds.   10. CAD/AVR: Off ASA due to bleed. No statin due to intolerance.              -no TEE was indicated 11  Post stroke dysphagia:                          -Advance diet per speech language pathology  12. Constipation: not receiving scheduled meds. Only prn  -Continue senokot-s at bedtime  `-Resume MiraLAX daily    Fifteen minutes of face to face patient care time were spent during this visit. All questions were encouraged and answered.  Follow up with me in 3-4 .

## 2019-11-06 ENCOUNTER — Other Ambulatory Visit: Payer: Self-pay | Admitting: *Deleted

## 2019-11-06 NOTE — Patient Outreach (Addendum)
Screened for potential [facility] Care Management needs as a benefit of  [employer].  Writer attended telephonic interdisciplinary team meeting to assess for disposition needs and transition plan for [facility] resident.   Facility reports the transition plan is to return home. Alternative plans have not been discussed with family thus far. Facility reports member is making slow steady functional gains. He remains confused per facility.   Will continue to follow while Mr. Cornwall remains in SNF.  Will plan outreach again to daughters to discuss their transition plans for member.   [name], MSN-Ed, RN,BSN [place] Acute Care Coordinator [phone] [facility]) [phone]  (Toll free office)

## 2019-11-06 NOTE — Patient Outreach (Signed)
Member screened for potential [facility] Care Management needs as a benefit of [place] Medicare.  David [name] is receiving skilled therapy at [facility].   Telephone call made to both daughters/ DPR on file  [name] [phone] and [name] [phone]. No answer.  HIPAA compliant voicemail message left to request return call.   Attempting to reach daughters to discuss their transition plan and potential [facility] services.   Will continue to follow while member resides in SNF.   [name], MSN-Ed, RN,BSN [place] Acute Care Coordinator [phone] [facility]) [phone]  (Toll free office)

## 2019-11-06 NOTE — Patient Outreach (Signed)
[facility] Post- Acute Care Coordinator follow up  Telephone call received from [facility] daughter/DPR [name] [phone]. Patient identifiers confirmed.   [name] states the plan is for Mr. Neels to ultimately return home. However, states member will need to be at a point that he required minimal assistance. Encouraged her to consider alternative plans if member does not get to a level for him to return home. [name] states member will not qualify for Medicaid. [name] states they have not thought of any alternative plans thus far.  [name] states she feels member is making progress at this point however. Briefly explained [place] Management services. Discussed that Probation officer will plan outreach at later time. Will send [place] [facility] and writer's contact information via email.  Will continue to follow transition plans and for potential [facility] services.   [name], MSN-Ed, RN,BSN [place] Acute Care Coordinator [phone] [facility]) [phone]  (Toll free office)

## 2019-11-11 ENCOUNTER — Encounter: Payer: Self-pay | Admitting: Adult Health

## 2019-11-11 ENCOUNTER — Ambulatory Visit (INDEPENDENT_AMBULATORY_CARE_PROVIDER_SITE_OTHER): Payer: Medicare Other | Admitting: Adult Health

## 2019-11-11 ENCOUNTER — Other Ambulatory Visit: Payer: Self-pay

## 2019-11-11 VITALS — BP 119/66 | HR 56 | Ht 71.0 in | Wt 190.0 lb

## 2019-11-11 DIAGNOSIS — I10 Essential (primary) hypertension: Secondary | ICD-10-CM

## 2019-11-11 DIAGNOSIS — I69391 Dysphagia following cerebral infarction: Secondary | ICD-10-CM

## 2019-11-11 DIAGNOSIS — I69320 Aphasia following cerebral infarction: Secondary | ICD-10-CM

## 2019-11-11 DIAGNOSIS — E785 Hyperlipidemia, unspecified: Secondary | ICD-10-CM

## 2019-11-11 DIAGNOSIS — I69359 Hemiplegia and hemiparesis following cerebral infarction affecting unspecified side: Secondary | ICD-10-CM

## 2019-11-11 DIAGNOSIS — I610 Nontraumatic intracerebral hemorrhage in hemisphere, subcortical: Secondary | ICD-10-CM

## 2019-11-11 NOTE — Patient Instructions (Signed)
Continue therapy for residual global aphasia, dysphagia, and right hemiparesis   Continue Zetia 10 mg daily  for secondary stroke prevention  Continue to follow up with PCP regarding cholesterol and blood pressure management   Maintain strict control of hypertension with blood pressure goal below 130/90, diabetes with hemoglobin A1c goal below 6.5% and cholesterol with LDL cholesterol (bad cholesterol) goal below 70 mg/dL. I also advised the patient to eat a healthy diet with plenty of whole grains, cereals, fruits and vegetables, exercise regularly and maintain ideal body weight.  Followup in the future with me in 3 months or call earlier if needed       Thank you for coming to see [place] at [facility]. I hope we have been able to provide you high quality care today.  You may receive a patient satisfaction survey over the next few weeks. We would appreciate your feedback and comments so that we may continue to improve ourselves and the health of our patients.

## 2019-11-11 NOTE — Progress Notes (Signed)
[facility] [address] [place]. [place] [address] [phone]       HOSPITAL FOLLOW UP NOTE  [name] Date of Birth:  1923/12/03 Medical Record Number:  [id number]   Reason for Referral:  hospital stroke follow up    SUBJECTIVE:   CHIEF COMPLAINT:  Chief Complaint  Patient presents with  . Follow-up    hospital fu, rm 9, with son, pt states voice is weak and difficulty speaking     HPI:   [name] is a 86 y.o. male with history of s/p AVR, impaired glucose tolerance, hypertension, hyperlipidemia, COPD and CAD who presented on 09/04/2019 with dysarthria, aphaisa, R sided weakness, R facial droop, L gaze preference.  Stroke work-up revealed left BG ICH secondary to small vessel disease versus thrombocytopenia.  2D echo showed pulmonic valve vegetation vs mass asymptomatic and likely chronic and advised ongoing follow-up with established cardiologist Dr. Claiborne Billings.  History of HTN with long-term BP goal normotensive range.  LDL 138 with history of statin intolerance and resume Zetia 10 mg daily.  Stable thrombocytopenia and advised outpatient follow-up with PCP for monitoring management.  Other stroke risk factors include advanced age, tobacco use, former smokeless tobacco use, obesity, CAD and s/p AV replacement.  Other active problems include COPD and CKD.  Residual deficits of severe dysarthria vs aphasia, dysphagia, right hemiplegia, lefthemiparesis and cognitive impairment.  Evaluated by therapies who recommended discharge to CIR on 09/09/2019 for ongoing therapy needs.  Per review of CIR discharge note, on 09/10/2019, patient noted to be more lethargic, low-grade fever and abdominal distention with CT head revealing slightly more surrounding edema as expected as well as slight enlargement of left basal ganglia hemorrhage with small amount of blood in occipital horn of bilateral ventricles.  Also found for worsening renal status and questionable  atelectasis versus pneumonia treated with IV Zosyn.  Lexapro initiated for mood stabilization as well as Ritalin and amantadine for activation and attention.  Due to slow progress reporting extensive amount of assistance, he was discharged to [facility] on 10/07/2019.  Stroke:   L BG ICH - small vessel disease vs. thrombocytopenia   Code Stroke CT head L lentiform nucleus ICH. Small vessel disease.   CT CS no traumatic injury. Spine degeneration  CTA head & neck L basal ganglia ICH. No LVO. Min atherosclerosis. Prior CABG. Aortic atherosclerosis.   CT head unchanged L basal ganglia ICH. Mild small vessel disease.  MRI  Stable L basal ganglia ICH. No underlying lesion. Old B basal ganglia infarcts. Small vessel disease.  2D Echo EF 60-65%, but pulmonic valve vegetation found  LDL 138  HgbA1c 5.9  SCDs for VTE prophylaxis  aspirin 81 mg daily prior to admission, now on No antithrombotic given hemorrhage and thrombocytopenia   Therapy recommendations:  CIR  Disposition:  CIR   Today, 11/11/2019, Mr. [name] is being seen for hospital follow-up accompanied by his son.  He continues to reside at [facility] where he continues to receive ongoing therapy.  Residual deficits of right spastic hemiplegia, cognitive impairment, dysphagia and hypophonia with expressive> receptive aphasia.  Son does report improvement with long-term goal of returning home depending on recovery.  Continues on modified diet per son.  Per review of MAR provided by facility continues on Zetia 10 mg daily for HLD management.  Blood pressure today 119/66.  No further concerns at this time.    ROS:   14 system review of systems performed  and negative with exception of speech difficulty, weakness, cognitive impairment and swallowing difficulty  PMH:  Past Medical History:  Diagnosis Date  . CAD (coronary artery disease)    intolerance of statins  . COPD (chronic obstructive pulmonary disease) ([place])   .  Current use of long term anticoagulation   . GERD (gastroesophageal reflux disease)   . Hyperlipidemia   . Hypertension   . Impaired glucose tolerance 02/14/2011  . LBP (low back pain)   . OA (osteoarthritis)   . 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    PSH:  Past Surgical History:  Procedure Laterality Date  . 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 CARDIAC CATHETERIZATION  10/05/1998   Recommend CABG revascularizaition  . CARDIAC CATHETERIZATION  06/11/1999   Continue medical therapy  . CARDIAC CATHETERIZATION  01/08/2007   Patent grafts, recommend medical therapy  . CARDIOVASCULAR STRESS TEST  04/22/2010   Small lateral defect which is partially reversible, no ischemic EKG changes were noted.  Marland Kitchen CAROTID DOPPLER  04/22/2010   Bilateral ICAs-no evidence of diametere reduction, significant tortuosity, or any other vascular abnormality.  . CORONARY ARTERY BYPASS GRAFT  10/09/1998   LIMA to LAD, SVG to diag, SVG sequential to 2 marginals, SVG sequential to PDA & PLA. CE#25 porcine AVR.  Marland Kitchen TRANSTHORACIC ECHOCARDIOGRAM  05/14/2012   EF 50-55%, bioprosthesis of the aortic valve with well preserved LV function, moderate concentric [place]    Social History:  Social History   Socioeconomic History  . Marital status: Widowed    Spouse name: Not on file  . Number of children: Not on file  . Years of education: Not on file  . Highest education level: Not on file  Occupational History  . Not on file  Tobacco Use  . Smoking status: Former Smoker    Types: Pipe    Quit date: 05/16/1998    Years since quitting: 21.5  . Smokeless tobacco: Former Network engineer  . Vaping Use: Never used  Substance and Sexual Activity  . Alcohol use: No  . Drug use: No  . Sexual activity: Never  Other Topics Concern  . Not on file  Social History Narrative  . Not on file   Social Determinants of Health   Financial Resource Strain:   . Difficulty of Paying Living Expenses:     Food Insecurity:   . Worried About Charity fundraiser in the Last Year:   . Arboriculturist in the Last Year:   Transportation Needs:   . Film/video editor (Medical):   Marland Kitchen Lack of Transportation (Non-Medical):   Physical Activity:   . Days of Exercise per Week:   . Minutes of Exercise per Session:   Stress:   . Feeling of Stress :   Social Connections:   . Frequency of Communication with Friends and Family:   . Frequency of Social Gatherings with Friends and Family:   . Attends Religious Services:   . Active Member of Clubs or Organizations:   . Attends Archivist Meetings:   Marland Kitchen Marital Status:   Intimate Partner Violence:   . Fear of Current or Ex-Partner:   . Emotionally Abused:   Marland Kitchen Physically Abused:   . Sexually Abused:     Family History:  Family History  Problem Relation Age of Onset  . Coronary artery disease Other   . Hypertension Other     Medications:   Current  Outpatient Medications on File Prior to Visit  Medication Sig Dispense Refill  . amantadine (SYMMETREL) 50 MG/5ML solution Take 10 mLs (100 mg total) by mouth 2 (two) times daily. 140 mL 0  . atenolol (TENORMIN) 25 MG tablet Take 1 tablet (25 mg total) by mouth daily. 90 tablet 3  . carbamide peroxide (DEBROX) 6.5 % OTIC solution 5 drops 2 (two) times daily.    Marland Kitchen escitalopram (LEXAPRO) 5 MG tablet Take 1 tablet (5 mg total) by mouth at bedtime.    Marland Kitchen ezetimibe (ZETIA) 10 MG tablet Take 1 tablet (10 mg total) by mouth daily. 90 tablet 3  . lactulose (CHRONULAC) 10 GM/15ML solution Take 30 mLs (20 g total) by mouth 2 (two) times daily as needed for mild constipation. 236 mL 0  . methylphenidate (RITALIN) 5 MG tablet Take 1 tablet (5 mg total) by mouth 2 (two) times daily with breakfast and lunch. 60 tablet 0  . pantoprazole sodium (PROTONIX) 40 mg/20 mL PACK Take 20 mLs (40 mg total) by mouth at bedtime. 30 mL   . senna-docusate (SENOKOT-S) 8.6-50 MG tablet Take 2 tablets by mouth 2 (two) times  daily.    Marland Kitchen acetaminophen (TYLENOL) 325 MG tablet Take 1-2 tablets (325-650 mg total) by mouth every 4 (four) hours as needed for mild pain.    Marland Kitchen losartan (COZAAR) 25 MG tablet Take 25 mg by mouth daily.     No current facility-administered medications on file prior to visit.    Allergies:   Allergies  Allergen Reactions  . Atorvastatin Other (See Comments)    Unknown reaction  . Colesevelam Other (See Comments)    Unknown reaction  . Simvastatin Other (See Comments)    Unknown reaction  . Sulfa Antibiotics Other (See Comments)    Unknown reaction      OBJECTIVE:  Physical Exam  Vitals:   11/11/19 1403  BP: 119/66  Pulse: (!) 56  Weight: 190 lb (86.2 kg)  Height: 5\' 11"  (1.803 m)   Body mass index is 26.5 kg/m. No exam data present  General: well developed, well nourished,  pleasant elderly Caucasian male, seated, in no evident distress Head: head normocephalic and atraumatic.   Neck: supple with no carotid or supraclavicular bruits Cardiovascular: regular rate and rhythm, no murmurs Musculoskeletal: no deformity Skin:  no rash/petichiae Vascular:  Normal pulses all extremities   Neurologic Exam Mental Status: Awake and fully alert.   Expressive > receptive aphasia and dysarthria.  Disoriented to place and time but oriented to city and state.  Mild to moderate difficulty following simple commands.  Recent and remote memory impaired. Attention span, concentration and fund of knowledge impaired.  Difficulty fully assessing cognition due to language impairment.  Mood and affect appropriate.  Cranial Nerves: Fundoscopic exam reveals sharp disc margins. Pupils equal, briskly reactive to light. Extraocular movements full without nystagmus. Visual fields full to confrontation. Hearing intact. Facial sensation intact.  Right lower facial weakness. Motor: Normal bulk and tone and full strength left upper and lower extremity RUE: 2/5 with increased spasticity and minimal hand  movement RLE: 1/5 Sensory.:  Right side response to painful stimuli but reports minimal sensation with light touch Coordination: Rapid alternating movements normal on left side. Finger-to-nose and heel-to-shin performed accurately on left side. Gait and Station: Deferred as patient nonambulatory Reflexes: 1+ and symmetric. Toes downgoing.     NIHSS  14 Modified Rankin  3-4      ASSESSMENT/PLAN: David [name] is a 86 y.o.  year old male presented with dysarthria, aphasia, right-sided weakness, right facial droop and left gaze preference on 09/04/2019 with stroke work-up revealing left BG ICH secondary to small vessel disease versus thrombocytopenia. Vascular risk factors include chronic pulmonic valve vegetation vs mass, HTN, HLD, thrombocytopenia, former tobacco use, CAD and s/p AV replacement.    1. L BG ICH :  -Residual deficits: Dysphagia, expressive> receptive aphasia, dysarthria, right hemiparesis with sensory impairment and likely cognitive impairment -continue to work with therapies at SNF -Avoid aspirin, aspirin-containing products and ibuprofen products due to recent ICH -Continue Zetia 10 mg daily for secondary stroke prevention.  -Continue to follow with PCP for HTN and HLD management and discussed importance of managing stroke risk factors 2. HTN:  -Stable with BP goal <130/90 -Defer ongoing monitoring to PCP 3. HLD:  -LDL goal <70 -Continue Zetia 10 mg daily -History of statin intolerance   Follow up in 3 months or call earlier if needed   I spent 45 minutes of face-to-face and non-face-to-face time with patient and son.  This included previsit chart review, lab review, study review, order entry, electronic health record documentation, patient education regarding recent stroke, residual deficits, importance of managing stroke risk factors and answered all questions to patient satisfaction    Frann Rider, [facility]  [facility] [address] [employer] [place],  [address]  Phone [phone] Fax [phone] Note: This document was prepared with digital dictation and possible smart phrase technology. Any transcriptional errors that result from this process are unintentional.

## 2019-11-12 NOTE — Progress Notes (Signed)
I agree with the above plan 

## 2019-11-13 ENCOUNTER — Other Ambulatory Visit: Payer: Self-pay | Admitting: *Deleted

## 2019-11-13 NOTE — Patient Outreach (Signed)
Screened for potential [facility] Care Management needs as a benefit of  [employer].  Mr. Farney is currently receiving skilled therapy at [facility].  Writer attended telephonic interdisciplinary team meeting to assess for disposition needs and transition plan for resident.   Facility reports member's family has had caregiver training and will have another prior to dc. Likely transition to home next week.   Will plan outreach again next week to discuss [facility] follow up.    [name], MSN-Ed, RN,BSN [place] Acute Care Coordinator [phone] [facility]) [phone]  (Toll free office)

## 2019-11-14 DIAGNOSIS — I69328 Other speech and language deficits following cerebral infarction: Secondary | ICD-10-CM | POA: Diagnosis not present

## 2019-11-14 DIAGNOSIS — J449 Chronic obstructive pulmonary disease, unspecified: Secondary | ICD-10-CM | POA: Diagnosis not present

## 2019-11-14 DIAGNOSIS — I69391 Dysphagia following cerebral infarction: Secondary | ICD-10-CM | POA: Diagnosis not present

## 2019-11-14 DIAGNOSIS — Z720 Tobacco use: Secondary | ICD-10-CM | POA: Diagnosis not present

## 2019-11-14 DIAGNOSIS — I69320 Aphasia following cerebral infarction: Secondary | ICD-10-CM | POA: Diagnosis not present

## 2019-11-14 DIAGNOSIS — I10 Essential (primary) hypertension: Secondary | ICD-10-CM | POA: Diagnosis not present

## 2019-11-14 DIAGNOSIS — H8110 Benign paroxysmal vertigo, unspecified ear: Secondary | ICD-10-CM | POA: Diagnosis not present

## 2019-11-14 DIAGNOSIS — I69351 Hemiplegia and hemiparesis following cerebral infarction affecting right dominant side: Secondary | ICD-10-CM | POA: Diagnosis not present

## 2019-11-14 DIAGNOSIS — I2510 Atherosclerotic heart disease of native coronary artery without angina pectoris: Secondary | ICD-10-CM | POA: Diagnosis not present

## 2019-11-14 DIAGNOSIS — R1312 Dysphagia, oropharyngeal phase: Secondary | ICD-10-CM | POA: Diagnosis not present

## 2019-11-14 DIAGNOSIS — K219 Gastro-esophageal reflux disease without esophagitis: Secondary | ICD-10-CM | POA: Diagnosis not present

## 2019-11-14 DIAGNOSIS — M1990 Unspecified osteoarthritis, unspecified site: Secondary | ICD-10-CM | POA: Diagnosis not present

## 2019-11-14 DIAGNOSIS — Z952 Presence of prosthetic heart valve: Secondary | ICD-10-CM | POA: Diagnosis not present

## 2019-11-14 DIAGNOSIS — E785 Hyperlipidemia, unspecified: Secondary | ICD-10-CM | POA: Diagnosis not present

## 2019-11-14 DIAGNOSIS — R251 Tremor, unspecified: Secondary | ICD-10-CM | POA: Diagnosis not present

## 2019-11-14 DIAGNOSIS — D696 Thrombocytopenia, unspecified: Secondary | ICD-10-CM | POA: Diagnosis not present

## 2019-11-19 ENCOUNTER — Other Ambulatory Visit: Payer: Self-pay | Admitting: *Deleted

## 2019-11-19 DIAGNOSIS — I69351 Hemiplegia and hemiparesis following cerebral infarction affecting right dominant side: Secondary | ICD-10-CM | POA: Diagnosis not present

## 2019-11-19 NOTE — Patient Outreach (Signed)
[place] Coordinator follow up.  David [name] remains in [facility].   Follow up telephone call made to David [name] (daughter/DPR) [phone]. Patient identifiers confirmed.   David [name] states member is not ready to return home yet. States they have appealed discharge and will continue to do so until they think he is ready. David [name] reports member will ultimately return home. However, she states the family has to get David [name] home prepared for his return.   Will continue to follow for transition plans and for potential [facility] Care Management needs.    [name], MSN-Ed, RN,BSN [place] Acute Care Coordinator [phone] [facility]) [phone]  (Toll free office)

## 2019-11-20 ENCOUNTER — Other Ambulatory Visit: Payer: Self-pay | Admitting: *Deleted

## 2019-11-20 DIAGNOSIS — I69351 Hemiplegia and hemiparesis following cerebral infarction affecting right dominant side: Secondary | ICD-10-CM | POA: Diagnosis not present

## 2019-11-20 NOTE — Patient Outreach (Signed)
Screened for potential [facility] Care Management needs as a benefit of  [employer].  Mr.Xzavier remains in [facility].   Writer attended telephonic interdisciplinary team meeting to assess for disposition needs and transition plan for resident.   Facility reports member has converted to private pay at this time.   No current [facility] Care Management needs.  [name], MSN-Ed, RN,BSN [place] Acute Care Coordinator [phone] [facility]) [phone]  (Toll free office)

## 2019-11-21 DIAGNOSIS — I69351 Hemiplegia and hemiparesis following cerebral infarction affecting right dominant side: Secondary | ICD-10-CM | POA: Diagnosis not present

## 2019-11-22 DIAGNOSIS — I69351 Hemiplegia and hemiparesis following cerebral infarction affecting right dominant side: Secondary | ICD-10-CM | POA: Diagnosis not present

## 2019-11-25 DIAGNOSIS — I69351 Hemiplegia and hemiparesis following cerebral infarction affecting right dominant side: Secondary | ICD-10-CM | POA: Diagnosis not present

## 2019-11-26 DIAGNOSIS — I69351 Hemiplegia and hemiparesis following cerebral infarction affecting right dominant side: Secondary | ICD-10-CM | POA: Diagnosis not present

## 2019-11-27 DIAGNOSIS — I69351 Hemiplegia and hemiparesis following cerebral infarction affecting right dominant side: Secondary | ICD-10-CM | POA: Diagnosis not present

## 2019-11-28 DIAGNOSIS — I69351 Hemiplegia and hemiparesis following cerebral infarction affecting right dominant side: Secondary | ICD-10-CM | POA: Diagnosis not present

## 2019-11-29 DIAGNOSIS — I69351 Hemiplegia and hemiparesis following cerebral infarction affecting right dominant side: Secondary | ICD-10-CM | POA: Diagnosis not present

## 2019-11-30 DIAGNOSIS — I69351 Hemiplegia and hemiparesis following cerebral infarction affecting right dominant side: Secondary | ICD-10-CM | POA: Diagnosis not present

## 2019-12-02 DIAGNOSIS — I69351 Hemiplegia and hemiparesis following cerebral infarction affecting right dominant side: Secondary | ICD-10-CM | POA: Diagnosis not present

## 2019-12-18 ENCOUNTER — Encounter: Payer: Self-pay | Admitting: Family

## 2019-12-18 ENCOUNTER — Non-Acute Institutional Stay (SKILLED_NURSING_FACILITY): Payer: Medicare Other | Admitting: Family

## 2019-12-18 DIAGNOSIS — E785 Hyperlipidemia, unspecified: Secondary | ICD-10-CM | POA: Diagnosis not present

## 2019-12-18 DIAGNOSIS — I610 Nontraumatic intracerebral hemorrhage in hemisphere, subcortical: Secondary | ICD-10-CM

## 2019-12-18 DIAGNOSIS — K5901 Slow transit constipation: Secondary | ICD-10-CM

## 2019-12-18 DIAGNOSIS — R2681 Unsteadiness on feet: Secondary | ICD-10-CM

## 2019-12-18 DIAGNOSIS — I2510 Atherosclerotic heart disease of native coronary artery without angina pectoris: Secondary | ICD-10-CM | POA: Diagnosis not present

## 2019-12-18 DIAGNOSIS — I69391 Dysphagia following cerebral infarction: Secondary | ICD-10-CM

## 2019-12-18 DIAGNOSIS — F321 Major depressive disorder, single episode, moderate: Secondary | ICD-10-CM | POA: Diagnosis not present

## 2019-12-18 DIAGNOSIS — I10 Essential (primary) hypertension: Secondary | ICD-10-CM | POA: Diagnosis not present

## 2019-12-18 MED ORDER — PANTOPRAZOLE SODIUM 40 MG PO PACK: 40.0000 mg | PACK | Freq: Every day | ORAL | 0 refills | Status: DC

## 2019-12-18 MED ORDER — ESCITALOPRAM OXALATE 5 MG PO TABS: 5.0000 mg | ORAL_TABLET | Freq: Every day | ORAL | 0 refills | Status: DC

## 2019-12-18 MED ORDER — AMANTADINE HCL 50 MG/5ML PO SYRP: 100.0000 mg | ORAL_SOLUTION | Freq: Two times a day (BID) | ORAL | 0 refills | Status: DC

## 2019-12-18 MED ORDER — ATENOLOL 25 MG PO TABS: 25.0000 mg | ORAL_TABLET | Freq: Every day | ORAL | 0 refills | Status: DC

## 2019-12-18 MED ORDER — LACTULOSE 10 GM/15ML PO SOLN: 20.0000 g | Freq: Two times a day (BID) | ORAL | 0 refills | Status: DC | PRN

## 2019-12-18 MED ORDER — SENNOSIDES-DOCUSATE SODIUM 8.6-50 MG PO TABS: 2.0000 | ORAL_TABLET | Freq: Two times a day (BID) | ORAL | 0 refills | Status: DC

## 2019-12-18 MED ORDER — EZETIMIBE 10 MG PO TABS: 10.0000 mg | ORAL_TABLET | Freq: Every day | ORAL | 0 refills | Status: DC

## 2019-12-18 NOTE — Progress Notes (Signed)
Location:  [place] Room Number: 102-P Place of Service:  SNF [phone])  Provider: Marlowe Sax FNP-C   PCP: Gayland Curry, DO Patient Care Team: Gayland Curry, DO as PCP - General (Geriatric Medicine) Troy Sine, MD as PCP - Cardiology (Cardiology) Meredith Staggers, MD as Consulting Physician (Physical Medicine and Rehabilitation)  Extended Emergency Contact Information Primary Emergency Contact: [name] Mobile Phone: [phone] Relation: Daughter Secondary Emergency Contact: Pryor Curia [place] Phone: [phone] Mobile Phone: [phone] Relation: Daughter  Code Status: DNR Goals of care:  Advanced Directive information Advanced Directives 12/18/2019  Does Patient Have a Medical Advance Directive? Yes  Type of Paramedic of David [name];Living will;Out of facility DNR (pink MOST or yellow form)  Does patient want to make changes to medical advance directive? No - Patient declined  Copy of [place] in Chart? Yes - validated most recent copy scanned in chart (See row information)     Allergies  Allergen Reactions   Atorvastatin Other (See Comments)    Unknown reaction   Colesevelam Other (See Comments)    Unknown reaction   Simvastatin Other (See Comments)    Unknown reaction   Sulfa Antibiotics Other (See Comments)    Unknown reaction    Chief Complaint  Patient presents with   Discharge Note    Discharge from SNF with daughter.    HPI:  86 y.o. 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.He had MBS 09/06/2019 which showed moderate dysphagia with delayed swallow and trace aspiration of thin liquids.He was placed on honey-thickened liquids via spoon.Had no signs of endocarditis.SNF was recommended.He was discharged to inpatient rehab at [facility] due to dysphagia,aphasia,delayed processing with difficulty following one -step commands,bilateral weakness,visual impairments which affected ADL's and mobility.He worked with PT/OT/ST for at least 3 hrs 5 days per week.[facility] was complicated with a bout of lethargy,low grade fever and abdominal distention.He was treated for constipation.CT scan done showed slight increased edema and bleeding a the left basal ganglia.He also had worsening renal function treated with IVFs with much improvement.CXR was concerning for PNA and was treated with IV zosyn and made NPO.repeat CXR was negative so antibiotic was stopped.Also had a repeat MBS and was started on dysphagia 1 honey liquids due to inconsistent swallow and minimize aspiration.Later diet was changed to dysphagia 2 with nectar thickened liquids and he was able to maintain hydration with water and ice chips.Tenormin was added for high blood pressure.He was also started on lexapro for depression and Ritalin for motivation.Amantadine was added for activation.He had gradual improvement and family elected to transfer to [facility] on 10/07/2019.Had follow up with Dr.Swartz and Neurology at [facility].He has a medical history of Hypertension, COPD,CAD,bioprosthetic aortic valve replacement,hyperlipidemia,Osteoarthritis,GERD,impaired glucose tolerance,depression among other conditions.  He has worked well with PT/OT now stable for discharge home.Had unremarkable stay here in rehab.He will be  discharged home  with [facility] health PT/OT to continue with ROM, Exercise, Gait stability and muscle strengthening. He will also require Home health Nurse for medication/B/p monitoring and Nurse Aide to assist with ADl's. He will require DME Hemi walker to allow her to maintain current level of independence with ADL's.He will need a Hi-back reclining wheelchair with removable arms,seat cushion and swing away legs to enable him  to maintain current level of independence with ADL's which cannot be achieved with regular wheelchair ,walker or a cane.    He will require a full electric Hospital bed  with  rails to alleviate pain by allowing back and hip to be repositioned in ways not feasible with a normal bed. Patient suffers from right side weakness and OA frequently require frequent and immediate changes in position which cannot be achieved with a normal bed.  He will also require a  3-1 bedside commode with a drop arm. Patient unable to safely and independently perform toileting transfer in home with unsteady gait and right sided weakness.  He will also require a mechanical lift with large pad to assist in transfer in and out of the bed.  Home health services will be arranged by facility social worker prior to discharge. Prescription medication will be written x 1 month then patient to follow up with PCP Dr.Plotnikov Aleksei  in 1-2 weeks.Also follow up with Cardiologist Dr.Kelly Marcello Moores.Dr. Naaman Plummer in 3-4 weeks for ICH with right sided weakness.He denies any acute issues this visit. Facility staff report no new concerns.   Past Medical History:  Diagnosis Date   CAD (coronary artery disease)    intolerance of statins   COPD (chronic obstructive pulmonary disease) (HCC)    Current use of long term anticoagulation    GERD (gastroesophageal reflux disease)    Hyperlipidemia    Hypertension    Impaired glucose tolerance 02/14/2011   LBP (low back pain)    OA (osteoarthritis)    S/P AVR  (aortic valve replacement) 2009   bonine valve    Past Surgical History:  Procedure Laterality Date   AORTIC VALVE REPLACEMENT  01/15/1999   bovine   CARDIAC CATHETERIZATION  10/05/1998   Recommend CABG revascularizaition   CARDIAC CATHETERIZATION  06/11/1999   Continue medical therapy   CARDIAC CATHETERIZATION  01/08/2007   Patent grafts, recommend medical therapy   CARDIOVASCULAR STRESS TEST  04/22/2010   Small lateral defect which is partially reversible, no ischemic EKG changes were noted.   CAROTID DOPPLER  04/22/2010   Bilateral ICAs-no evidence of diametere reduction, significant tortuosity, or any other vascular abnormality.   CORONARY ARTERY BYPASS GRAFT  10/09/1998   LIMA to LAD, SVG to diag, SVG sequential to 2 marginals, SVG sequential to PDA & PLA. CE#25 porcine AVR.   TRANSTHORACIC ECHOCARDIOGRAM  05/14/2012   EF 50-55%, bioprosthesis of the aortic valve with well preserved LV function, moderate concentric [place]      reports that he quit smoking about 21 years ago. His smoking use included pipe. He has quit using smokeless tobacco. He reports that he does not drink alcohol and does not use drugs. Social History   Socioeconomic History   Marital status: Widowed    Spouse name: Not on file   Number of children: Not on file   Years of education: Not on file   Highest education level: Not on file  Occupational History   Not on file  Tobacco Use   Smoking status: Former Smoker    Types: Pipe  Quit date: 05/16/1998    Years since quitting: 21.6   Smokeless tobacco: Former Counsellor Use: Never used  Substance and Sexual Activity   Alcohol use: No   Drug use: No   Sexual activity: Never  Other Topics Concern   Not on file  Social History Narrative   Not on file   Social Determinants of Health   Financial Resource Strain:    Difficulty of Paying Living Expenses:   Food Insecurity:    Worried About Charity fundraiser in the  Last Year:    Arboriculturist in the Last Year:   Transportation Needs:    Film/video editor (Medical):    Lack of Transportation (Non-Medical):   Physical Activity:    Days of Exercise per Week:    Minutes of Exercise per Session:   Stress:    Feeling of Stress :   Social Connections:    Frequency of Communication with Friends and Family:    Frequency of Social Gatherings with Friends and Family:    Attends Religious Services:    Active Member of Clubs or Organizations:    Attends Archivist Meetings:    Marital Status:   Intimate Partner Violence:    Fear of Current or Ex-Partner:    Emotionally Abused:    Physically Abused:    Sexually Abused:        Allergies  Allergen Reactions   Atorvastatin Other (See Comments)    Unknown reaction   Colesevelam Other (See Comments)    Unknown reaction   Simvastatin Other (See Comments)    Unknown reaction   Sulfa Antibiotics Other (See Comments)    Unknown reaction    Pertinent  Health Maintenance Due  Topic Date Due   INFLUENZA VACCINE  12/15/2019   PNA vac Low Risk Adult  Completed    Medications: Outpatient Encounter Medications as of 12/18/2019  Medication Sig   acetaminophen (TYLENOL) 325 MG tablet Take 1-2 tablets (325-650 mg total) by mouth every 4 (four) hours as needed for mild pain.   amantadine (SYMMETREL) 50 MG/5ML solution Take 10 mLs (100 mg total) by mouth 2 (two) times daily.   atenolol (TENORMIN) 25 MG tablet Take 1 tablet (25 mg total) by mouth daily.   escitalopram (LEXAPRO) 5 MG tablet Take 1 tablet (5 mg total) by mouth at bedtime.   ezetimibe (ZETIA) 10 MG tablet Take 1 tablet (10 mg total) by mouth daily.   lactulose (CHRONULAC) 10 GM/15ML solution Take 30 mLs (20 g total) by mouth 2 (two) times daily as needed for mild constipation.   pantoprazole sodium (PROTONIX) 40 mg/20 mL PACK Take 20 mLs (40 mg total) by mouth at bedtime.   polyethylene glycol  (MIRALAX / GLYCOLAX) 17 g packet Take 17 g by mouth daily.   senna-docusate (SENOKOT-S) 8.6-50 MG tablet Take 2 tablets by mouth 2 (two) times daily.   [DISCONTINUED] amantadine (SYMMETREL) 50 MG/5ML solution Take 10 mLs (100 mg total) by mouth 2 (two) times daily.   [DISCONTINUED] atenolol (TENORMIN) 25 MG tablet Take 1 tablet (25 mg total) by mouth daily.   [DISCONTINUED] escitalopram (LEXAPRO) 5 MG tablet Take 1 tablet (5 mg total) by mouth at bedtime.   [DISCONTINUED] ezetimibe (ZETIA) 10 MG tablet Take 1 tablet (10 mg total) by mouth daily.   [DISCONTINUED] lactulose (CHRONULAC) 10 GM/15ML solution Take 30 mLs (20 g total) by mouth 2 (two) times daily as needed for mild  constipation.   [DISCONTINUED] pantoprazole sodium (PROTONIX) 40 mg/20 mL PACK Take 20 mLs (40 mg total) by mouth at bedtime.   [DISCONTINUED] senna-docusate (SENOKOT-S) 8.6-50 MG tablet Take 2 tablets by mouth 2 (two) times daily.   [DISCONTINUED] carbamide peroxide (DEBROX) 6.5 % OTIC solution 5 drops 2 (two) times daily.   [DISCONTINUED] losartan (COZAAR) 25 MG tablet Take 25 mg by mouth daily.   No facility-administered encounter medications on file as of 12/18/2019.     Review of Systems  Constitutional: Negative for appetite change, chills, fatigue and fever.  HENT: Positive for trouble swallowing. Negative for congestion, rhinorrhea, sinus pressure, sinus pain, sneezing and sore throat.        On Dysphagia 2 diet per HPI   Eyes: Negative for pain, discharge, redness and itching.  Respiratory: Negative for cough, chest tightness, shortness of breath and wheezing.   Cardiovascular: Negative for chest pain, palpitations and leg swelling.  Gastrointestinal: Negative for abdominal distention, abdominal pain, constipation, diarrhea, nausea and vomiting.  Endocrine: Negative for cold intolerance, heat intolerance, polydipsia, polyphagia and polyuria.  Genitourinary: Negative for difficulty urinating, dysuria and  urgency.       Incontinent   Musculoskeletal: Positive for gait problem. Negative for joint swelling and myalgias.  Skin: Negative for color change, pallor and rash.  Neurological: Positive for speech difficulty, weakness and numbness. Negative for dizziness, syncope, light-headedness and headaches.       Right side weakness   Hematological: Does not bruise/bleed easily.  Psychiatric/Behavioral: Negative for agitation, confusion and sleep disturbance. The patient is not nervous/anxious.     Vitals:   12/18/19 0857  BP: 118/71  Pulse: 74  Resp: 18  Temp: 97.8 F (36.6 C)  Weight: 192 lb (87.1 kg)  Height: 5\' 11"  (1.803 m)   Body mass index is 26.78 kg/m. Physical Exam Vitals and nursing note reviewed.  Constitutional:      General: He is not in acute distress.    Appearance: He is overweight. He is not ill-appearing.  HENT:     Head: Normocephalic.     Nose: Nose normal. No congestion or rhinorrhea.     Mouth/Throat:     Mouth: Mucous membranes are moist.     Pharynx: Oropharynx is clear. No oropharyngeal exudate or posterior oropharyngeal erythema.  Eyes:     General: No scleral icterus.       Right eye: No discharge.        Left eye: No discharge.     Conjunctiva/sclera: Conjunctivae normal.     Pupils: Pupils are equal, round, and reactive to light.  Neck:     Vascular: No carotid bruit.  Cardiovascular:     Rate and Rhythm: Normal rate and regular rhythm.     Pulses: Normal pulses.     Heart sounds: Murmur heard.  No friction rub. No gallop.   Pulmonary:     Effort: Pulmonary effort is normal. No respiratory distress.     Breath sounds: Normal breath sounds. No wheezing, rhonchi or rales.  Chest:     Chest wall: No tenderness.  Abdominal:     General: Bowel sounds are normal. There is no distension.     Palpations: Abdomen is soft. There is no mass.     Tenderness: There is no abdominal tenderness. There is no right CVA tenderness, left CVA tenderness,  guarding or rebound.  Musculoskeletal:        General: No swelling or tenderness.     Cervical back: Normal  range of motion. No rigidity or tenderness.     Right lower leg: No edema.     Left lower leg: No edema.     Comments: Unsteady gait   Lymphadenopathy:     Cervical: No cervical adenopathy.  Skin:    General: Skin is warm and dry.     Coloration: Skin is not pale.     Findings: No bruising, erythema or rash.  Neurological:     Mental Status: He is alert and oriented to person, place, and time.     Cranial Nerves: Cranial nerve deficit present.     Sensory: Sensory deficit present.     Motor: Weakness present.     Coordination: Coordination abnormal.     Gait: Gait abnormal.  Psychiatric:        Mood and Affect: Mood normal.        Behavior: Behavior normal.        Thought Content: Thought content normal.        Judgment: Judgment normal.     Comments: Speech impaired due to aphasia and dysarthria     Labs reviewed: Basic Metabolic Panel: Recent Labs    09/30/19 0502 09/30/19 0502 10/04/19 0735 10/04/19 0735 10/07/19 0539 10/09/19 0000 10/18/19 0000  NA 139   < > 140   < > 139 139 139  K 4.0   < > 3.7   < > 3.4* 4.7 4.6  CL 106   < > 104   < > 102 104 103  CO2 25   < > David   < > 27 21 21   GLUCOSE 110*  --  102*  --  90  --   --   BUN 9   < > 13   < > 14 16 14   CREATININE 1.25*   < > 1.22   < > 1.16 1.1 1.1  CALCIUM 8.8*   < > 9.2   < > 9.2 9.5 9.8   < > = values in this interval not displayed.   Liver Function Tests: Recent Labs    09/04/19 1300 09/05/19 0812 09/10/19 0610  AST 35 21 19  ALT 19 15 20   ALKPHOS 46 43 37*  BILITOT 1.3* 1.4* 1.4*  PROT 6.9 6.5 5.7*  ALBUMIN 3.8 3.3* 2.7*   CBC: Recent Labs    09/23/19 0534 09/23/19 0534 09/30/19 0502 09/30/19 0502 10/04/19 0735 10/04/19 0735 10/05/19 0545 10/05/19 0545 10/07/19 0539 10/07/19 0539 10/09/19 0000 10/18/19 0000 10/28/19 0000  WBC 7.5   < > 6.6   < > 7.9   < > 7.3   < > 6.9  --   6.7 7.5 7.0  NEUTROABS 4.5  --  4.1  --   --   --   --   --  3.9  --   --   --   --   HGB 15.9   < > 15.0   < > 16.3   < > 15.4   < > 16.2   < > 16.8 17.4 16.6  HCT 47.8   < > 45.2   < > 48.5   < > 46.2   < > 49.2   < > 50 50 49  MCV 91.4   < > 92.4   < > 91.9  --  93.5  --  93.5  --   --   --   --   PLT 122*   < > 97*   < > 83*   < >  83*   < > 78*   < > 94* 95* 104*   < > = values in this interval not displayed.   CBG: Recent Labs    10/06/19 2046 10/07/19 0601 10/07/19 1133  GLUCAP 139* 167* 112*    Procedures and Imaging Studies During Stay: No results found.  Assessment/Plan:      1. Unsteady gait Has worked well with PT/ OT.He will discharge home PT/OT to continue with ROM, Exercise, Gait stability and muscle strengthening.He will require DME Rollator to allow her to maintain current level of independence with ADL's. Fall and safety precautions.  - Hemi walker -  Hi-back reclining wheelchair with removable arms,seat cushion and swing away legs   - Full electric Hospital bed  with  rails - A  3-1 bedside commode with a drop arm.  - A  mechanical lift with large pad  2. Essential hypertension B/p well controlled. - continue on Atenolol  - atenolol (TENORMIN) 25 MG tablet; Take 1 tablet (25 mg total) by mouth daily.  Dispense: 30 tablet; Refill: 0 - Home Health Nurse to monitor   3. Depression, major, single episode, moderate (HCC) Mood stable. - continue on Ritalin and amantadine for motivation.continue on Lexapro. - monitor for mood changes. - amantadine (SYMMETREL) 50 MG/5ML solution; Take 10 mLs (100 mg total) by mouth 2 (two) times daily.  Dispense: 473 mL; Refill: 0 - escitalopram (LEXAPRO) 5 MG tablet; Take 1 tablet (5 mg total) by mouth at bedtime.  Dispense: 30 tablet; Refill: 0  4. Dyslipidemia Continue on ezetimibe 10 mg tablet. - Lipid panel with PCP  - ezetimibe (ZETIA) 10 MG tablet; Take 1 tablet (10 mg total) by mouth daily.  Dispense: 30 tablet;  Refill: 0  5. Dysphagia, post-stroke - continue on dysphagia 2 diet with nectar thickened liquids. - continue with Aspiration precaution   6. Coronary artery disease involving native coronary artery of native heart without angina pectoris Chest pain free. - continue on Atenolol and ezetimibe   7. Slow transit constipation Current regimen effective.continue to encourage oral intake and hydration.  - lactulose (CHRONULAC) 10 GM/15ML solution; Take 30 mLs (20 g total) by mouth 2 (two) times daily as needed for mild constipation.  Dispense: 236 mL; Refill: 0 - senna-docusate (SENOKOT-S) 8.6-50 MG tablet; Take 2 tablets by mouth 2 (two) times daily.  Dispense: 60 tablet; Refill: 0  8. Nontraumatic subcortical hemorrhage of left cerebral hemisphere [facility]) Status post hospitalization 09/04/2019 - 4/ 26/2021 and [facility] 09/09/2019 - 10/07/2019 - discharge home with PT/OT/HHN / Nurse Aide - will discharge with above DME. - Follow up with PCP - 1-2 weeks CBC,BMP  - Fololw up with Dr.Swartz and Cardiologist Dr.Kelly Marcello Moores as directed.    Patient is being discharged with the following home health services:   -PT/OT for ROM, exercise, gait stability and muscle strengthening  -  HH RN   - HH Aid for ADL's assist   Patient is being discharged with the following durable medical equipment:    - Hemi walker  -  Hi-back reclining wheelchair with removable arms,seat cushion and swing away legs    - Full electric Hospital bed  with  rails  - A  3-1 bedside commode with a drop arm.   - A  mechanical lift with large pad  Patient has been advised to f/u with their PCP in 1-2 weeks to for a transitions of care visit.Social services at their facility was responsible for arranging this appointment.  Pt was provided with adequate prescriptions of noncontrolled medications to reach the scheduled appointment.For controlled substances, a limited supply was provided as appropriate for the individual  patient. If the pt normally receives these medications from a pain clinic or has a contract with another physician, these medications should be received from that clinic or physician only).    Future labs/tests needed:  CBC, BMP in 1-2 weeks PCP

## 2019-12-25 ENCOUNTER — Other Ambulatory Visit: Payer: Self-pay

## 2019-12-25 ENCOUNTER — Encounter: Payer: Medicare Other | Attending: Physical Medicine & Rehabilitation | Admitting: Physical Medicine & Rehabilitation

## 2019-12-25 ENCOUNTER — Encounter: Payer: Self-pay | Admitting: Physical Medicine & Rehabilitation

## 2019-12-25 VITALS — BP 128/69 | HR 70

## 2019-12-25 DIAGNOSIS — I610 Nontraumatic intracerebral hemorrhage in hemisphere, subcortical: Secondary | ICD-10-CM | POA: Diagnosis not present

## 2019-12-25 DIAGNOSIS — G8111 Spastic hemiplegia affecting right dominant side: Secondary | ICD-10-CM

## 2019-12-25 NOTE — Patient Instructions (Signed)
PLEASE FEEL FREE TO CALL OUR OFFICE WITH ANY PROBLEMS OR QUESTIONS ([phone])      

## 2019-12-25 NOTE — Progress Notes (Signed)
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 This includes bleeding, bruising, infection, excessive weakness, or medication side effects. A REMS form is on file and signed.  right Needle: 31mm injectable monopolar needle electrode  Number of units per muscle Pectoralis Major 0 units Pectoralis Minor 0 units Biceps 0 units Brachioradialis 0 units FCR 10 units FCU 10 units FDS 40 units FDP 40 units FPL 0 units Pronator Teres 0 units Pronator Quadratus 0 units Lumbricals 0 units Quadriceps 0 units Gastroc/soleus 50 units Hamstrings 0 units Tibialis Posterior 50 units Tibialis Anterior 0 units EHL 0 units All injections were done after obtaining appropriate EMG activity and after negative drawback for blood. The patient tolerated the procedure well. Post procedure instructions were given. Return in about 2 months (around 02/24/2020).

## 2019-12-28 DIAGNOSIS — I69351 Hemiplegia and hemiparesis following cerebral infarction affecting right dominant side: Secondary | ICD-10-CM | POA: Diagnosis not present

## 2019-12-28 DIAGNOSIS — M545 Low back pain: Secondary | ICD-10-CM | POA: Diagnosis not present

## 2019-12-28 DIAGNOSIS — I10 Essential (primary) hypertension: Secondary | ICD-10-CM | POA: Diagnosis not present

## 2019-12-28 DIAGNOSIS — I2510 Atherosclerotic heart disease of native coronary artery without angina pectoris: Secondary | ICD-10-CM

## 2019-12-28 DIAGNOSIS — E785 Hyperlipidemia, unspecified: Secondary | ICD-10-CM | POA: Diagnosis not present

## 2019-12-28 DIAGNOSIS — R1310 Dysphagia, unspecified: Secondary | ICD-10-CM | POA: Diagnosis not present

## 2019-12-28 DIAGNOSIS — D485 Neoplasm of uncertain behavior of skin: Secondary | ICD-10-CM | POA: Diagnosis not present

## 2019-12-28 DIAGNOSIS — D696 Thrombocytopenia, unspecified: Secondary | ICD-10-CM | POA: Diagnosis not present

## 2019-12-28 DIAGNOSIS — K219 Gastro-esophageal reflux disease without esophagitis: Secondary | ICD-10-CM | POA: Diagnosis not present

## 2019-12-28 DIAGNOSIS — H8110 Benign paroxysmal vertigo, unspecified ear: Secondary | ICD-10-CM | POA: Diagnosis not present

## 2019-12-28 DIAGNOSIS — I69328 Other speech and language deficits following cerebral infarction: Secondary | ICD-10-CM

## 2019-12-28 DIAGNOSIS — Z952 Presence of prosthetic heart valve: Secondary | ICD-10-CM | POA: Diagnosis not present

## 2019-12-28 DIAGNOSIS — J449 Chronic obstructive pulmonary disease, unspecified: Secondary | ICD-10-CM

## 2019-12-28 DIAGNOSIS — Z87891 Personal history of nicotine dependence: Secondary | ICD-10-CM | POA: Diagnosis not present

## 2019-12-28 DIAGNOSIS — F0789 Other personality and behavioral disorders due to known physiological condition: Secondary | ICD-10-CM | POA: Diagnosis not present

## 2019-12-28 DIAGNOSIS — M1990 Unspecified osteoarthritis, unspecified site: Secondary | ICD-10-CM | POA: Diagnosis not present

## 2019-12-28 DIAGNOSIS — F329 Major depressive disorder, single episode, unspecified: Secondary | ICD-10-CM | POA: Diagnosis not present

## 2019-12-28 DIAGNOSIS — E119 Type 2 diabetes mellitus without complications: Secondary | ICD-10-CM | POA: Diagnosis not present

## 2019-12-28 DIAGNOSIS — Z9181 History of falling: Secondary | ICD-10-CM | POA: Diagnosis not present

## 2019-12-28 DIAGNOSIS — I69391 Dysphagia following cerebral infarction: Secondary | ICD-10-CM | POA: Diagnosis not present

## 2019-12-31 DIAGNOSIS — E119 Type 2 diabetes mellitus without complications: Secondary | ICD-10-CM | POA: Diagnosis not present

## 2019-12-31 DIAGNOSIS — I69351 Hemiplegia and hemiparesis following cerebral infarction affecting right dominant side: Secondary | ICD-10-CM | POA: Diagnosis not present

## 2019-12-31 DIAGNOSIS — I10 Essential (primary) hypertension: Secondary | ICD-10-CM | POA: Diagnosis not present

## 2019-12-31 DIAGNOSIS — I69391 Dysphagia following cerebral infarction: Secondary | ICD-10-CM | POA: Diagnosis not present

## 2019-12-31 DIAGNOSIS — F329 Major depressive disorder, single episode, unspecified: Secondary | ICD-10-CM | POA: Diagnosis not present

## 2019-12-31 DIAGNOSIS — R1310 Dysphagia, unspecified: Secondary | ICD-10-CM | POA: Diagnosis not present

## 2020-01-02 ENCOUNTER — Other Ambulatory Visit: Payer: Self-pay

## 2020-01-02 ENCOUNTER — Encounter: Payer: Self-pay | Admitting: Internal Medicine

## 2020-01-02 ENCOUNTER — Ambulatory Visit (INDEPENDENT_AMBULATORY_CARE_PROVIDER_SITE_OTHER): Payer: Medicare Other | Admitting: Internal Medicine

## 2020-01-02 VITALS — BP 120/76 | HR 73 | Temp 98.1°F | Ht 71.0 in

## 2020-01-02 DIAGNOSIS — K5901 Slow transit constipation: Secondary | ICD-10-CM | POA: Diagnosis not present

## 2020-01-02 DIAGNOSIS — G8111 Spastic hemiplegia affecting right dominant side: Secondary | ICD-10-CM | POA: Diagnosis not present

## 2020-01-02 DIAGNOSIS — I69922 Dysarthria following unspecified cerebrovascular disease: Secondary | ICD-10-CM

## 2020-01-02 DIAGNOSIS — I2510 Atherosclerotic heart disease of native coronary artery without angina pectoris: Secondary | ICD-10-CM | POA: Diagnosis not present

## 2020-01-02 DIAGNOSIS — R4701 Aphasia: Secondary | ICD-10-CM | POA: Diagnosis not present

## 2020-01-02 DIAGNOSIS — I610 Nontraumatic intracerebral hemorrhage in hemisphere, subcortical: Secondary | ICD-10-CM | POA: Diagnosis not present

## 2020-01-02 LAB — COMPLETE METABOLIC PANEL WITH GFR
AG Ratio: 1.5 (calc) (ref 1.0–2.5)
ALT: 52 U/L — ABNORMAL HIGH (ref 9–46)
AST: 45 U/L — ABNORMAL HIGH (ref 10–35)
Albumin: 4.3 g/dL (ref 3.6–5.1)
Alkaline phosphatase (APISO): 70 U/L (ref 35–144)
BUN/Creatinine Ratio: 10 (calc) (ref 6–22)
BUN: 12 mg/dL (ref 7–25)
CO2: 28 mmol/L (ref 20–32)
Calcium: 9.7 mg/dL (ref 8.6–10.3)
Chloride: 100 mmol/L (ref 98–110)
Creat: 1.16 mg/dL — ABNORMAL HIGH (ref 0.70–1.11)
GFR, Est African American: 62 mL/min/{1.73_m2} (ref >=60)
GFR, Est Non African American: 54 mL/min/{1.73_m2} — ABNORMAL LOW (ref >=60)
Globulin: 2.9 g/dL (calc) (ref 1.9–3.7)
Glucose, Bld: 145 mg/dL — ABNORMAL HIGH (ref 65–99)
Potassium: 4.6 mmol/L (ref 3.5–5.3)
Sodium: 138 mmol/L (ref 135–146)
Total Bilirubin: 0.7 mg/dL (ref 0.2–1.2)
Total Protein: 7.2 g/dL (ref 6.1–8.1)

## 2020-01-02 MED ORDER — PANTOPRAZOLE SODIUM 40 MG PO TBEC
40.0000 mg | DELAYED_RELEASE_TABLET | Freq: Every day | ORAL | 3 refills | Status: DC
Start: 2020-01-02 — End: 2020-12-14

## 2020-01-02 MED ORDER — LACTULOSE 10 GM/15ML PO SOLN: ORAL | 5 refills | Status: DC

## 2020-01-02 MED ORDER — LACTULOSE 10 GM/15ML PO SOLN: 30.0000 g | Freq: Two times a day (BID) | ORAL | 5 refills | Status: DC | PRN

## 2020-01-02 NOTE — Assessment & Plan Note (Signed)
Severe Lactulose Miralax

## 2020-01-02 NOTE — Progress Notes (Signed)
Subjective:  Patient ID: David [name], male    DOB: 10/27/1923  Age: 86 y.o. MRN: [id number]  CC: No chief complaint on file.   HPI David [name] presents for CVA, R hemiparesis  Outpatient Medications Prior to Visit  Medication Sig Dispense Refill  . acetaminophen (TYLENOL) 325 MG tablet Take 1-2 tablets (325-650 mg total) by mouth every 4 (four) hours as needed for mild pain.    Marland Kitchen amantadine (SYMMETREL) 50 MG/5ML solution Take 10 mLs (100 mg total) by mouth 2 (two) times daily. 473 mL 0  . atenolol (TENORMIN) 25 MG tablet Take 1 tablet (25 mg total) by mouth daily. 30 tablet 0  . escitalopram (LEXAPRO) 5 MG tablet Take 1 tablet (5 mg total) by mouth at bedtime. 30 tablet 0  . ezetimibe (ZETIA) 10 MG tablet Take 1 tablet (10 mg total) by mouth daily. 30 tablet 0  . polyethylene glycol (MIRALAX / GLYCOLAX) 17 g packet Take 17 g by mouth daily.    Marland Kitchen senna-docusate (SENOKOT-S) 8.6-50 MG tablet Take 2 tablets by mouth 2 (two) times daily. 60 tablet 0  . lactulose (CHRONULAC) 10 GM/15ML solution Take 30 mLs (20 g total) by mouth 2 (two) times daily as needed for mild constipation. 236 mL 0  . pantoprazole sodium (PROTONIX) 40 mg/20 mL PACK Take 20 mLs (40 mg total) by mouth at bedtime. 30 mL 0   No facility-administered medications prior to visit.    ROS: Review of Systems  Constitutional: Positive for fatigue. Negative for appetite change and unexpected weight change.  HENT: Negative for congestion, nosebleeds, sneezing, sore throat and trouble swallowing.   Eyes: Negative for itching and visual disturbance.  Respiratory: Negative for cough.   Cardiovascular: Negative for chest pain, palpitations and leg swelling.  Gastrointestinal: Negative for abdominal distention, blood in stool, diarrhea and nausea.  Genitourinary: Negative for frequency and hematuria.  Musculoskeletal: Positive for arthralgias and gait problem. Negative for back pain, joint swelling and neck pain.  Skin:  Negative for rash.  Neurological: Positive for weakness. Negative for dizziness, tremors and speech difficulty.  Psychiatric/Behavioral: Negative for agitation, dysphoric mood and sleep disturbance. The patient is not nervous/anxious.     Objective:  BP 120/76   Pulse 73   Temp 98.1 F (36.7 C) (Oral)   Ht 5\' 11"  (1.803 m)   SpO2 98%   BMI 26.78 kg/m   BP Readings from Last 3 Encounters:  01/02/20 120/76  12/25/19 128/69  12/18/19 118/71    Wt Readings from Last 3 Encounters:  12/18/19 192 lb (87.1 kg)  11/11/19 190 lb (86.2 kg)  10/30/19 190 lb (86.2 kg)    Physical Exam Constitutional:      General: He is not in acute distress.    Appearance: He is well-developed. He is obese.     Comments: NAD  Eyes:     Conjunctiva/sclera: Conjunctivae normal.     Pupils: Pupils are equal, round, and reactive to light.  Neck:     Thyroid: No thyromegaly.     Vascular: No JVD.  Cardiovascular:     Rate and Rhythm: Normal rate and regular rhythm.     Heart sounds: Normal heart sounds. No murmur heard.  No friction rub. No gallop.   Pulmonary:     Effort: Pulmonary effort is normal. No respiratory distress.     Breath sounds: Normal breath sounds. No wheezing or rales.  Chest:     Chest wall: No tenderness.  Abdominal:  General: Bowel sounds are normal. There is no distension.     Palpations: Abdomen is soft. There is no mass.     Tenderness: There is no abdominal tenderness. There is no guarding or rebound.  Musculoskeletal:        General: No tenderness. Normal range of motion.     Cervical back: Normal range of motion.  Lymphadenopathy:     Cervical: No cervical adenopathy.  Skin:    General: Skin is warm and dry.     Findings: No rash.  Neurological:     Mental Status: He is alert and oriented to person, place, and time.     Cranial Nerves: No cranial nerve deficit.     Motor: Weakness present. No abnormal muscle tone.     Coordination: Coordination abnormal.      Gait: Gait abnormal.     Deep Tendon Reflexes: Reflexes abnormal.  Psychiatric:        Behavior: Behavior normal.        Thought Content: Thought content normal.   in a w/c Dysarthric R hemiparesis In a w/c  Lab Results  Component Value Date   WBC 7.0 10/28/2019   HGB 16.6 10/28/2019   HCT 49 10/28/2019   PLT 104 (A) 10/28/2019   GLUCOSE 90 10/07/2019   CHOL 137 11/16/2019   TRIG 58 11/16/2019   HDL 54 11/16/2019   LDLDIRECT 136.2 08/01/2007   LDLCALC 72 11/16/2019   ALT 20 09/10/2019   AST 19 09/10/2019   NA 139 10/18/2019   K 4.6 10/18/2019   CL 103 10/18/2019   CREATININE 1.1 10/18/2019   BUN 14 10/18/2019   CO2 21 10/18/2019   TSH 3.38 05/20/2019   PSA 4.88 (H) 05/20/2019   INR 1.2 09/06/2019   HGBA1C 5.9 (H) 09/04/2019    DG Chest 2 View  Result Date: 09/10/2019 CLINICAL DATA:  Fever EXAM: CHEST - 2 VIEW COMPARISON:  09/04/2019 FINDINGS: Prior CABG and aortic valve replacement. The heart size and mediastinal contours are stable. Low lung volumes. Increased interstitial markings within the left lung base, slightly progressed from prior. No pneumothorax. IMPRESSION: Increased interstitial markings within the left lung base, slightly progressed from prior. Findings could represent atelectasis versus pneumonia. Electronically Signed   By: Davina Poke D.O.   On: 09/10/2019 16:33   DG Abd 1 View  Result Date: 09/10/2019 CLINICAL DATA:  Abdominal distension. EXAM: ABDOMEN - 1 VIEW COMPARISON:  None. FINDINGS: The bowel gas pattern is normal. Residual contrast is noted in the colon. No radio-opaque calculi or other significant radiographic abnormality are seen. IMPRESSION: No evidence of bowel obstruction or ileus. Electronically Signed   By: Marijo Conception M.D.   On: 09/10/2019 16:32   CT HEAD WO CONTRAST  Result Date: 09/10/2019 CLINICAL DATA:  Follow-up left basal ganglia hemorrhage. Increased lethargy. EXAM: CT HEAD WITHOUT CONTRAST TECHNIQUE: Contiguous axial  images were obtained from the base of the skull through the vertex without intravenous contrast. COMPARISON:  09/04/2019 FINDINGS: Brain: Slight enlargement of the left basal ganglia intraparenchymal hemorrhage, measuring 4.4 x 2.5 x 3.6 cm (volume = 21 cm^3) today compared with 16 cc volume 6 days ago. Additionally, there is intraventricular penetration, having breach the atrium of the left lateral ventricle, with a small amount of blood dependent in both occipital horns. Ventricular size remains stable however without hydrocephalus. Surrounding edema is slightly more pronounced. One could question if there has been extension of infarction more anteriorly within the basal ganglia and  external capsule or if this relates to edema from the hemorrhage. Other small-vessel ischemic changes throughout brain appear stable. No new large vessel infarction. No extra-axial collection. Vascular: There is atherosclerotic calcification of the major vessels at the base of the brain. Skull: Negative Sinuses/Orbits: Clear/normal Other: None IMPRESSION: Slight enlargement of the left basal ganglia intraparenchymal hemorrhage when compared to the study of 6 days ago. Previous volume was calculated at 16 cc. Today calculated at 21 cc. There is also intraventricular penetration with a small amount of blood dependent in the occipital horns of both lateral ventricles but no hydrocephalus. Slightly more surrounding edema as expected. Some edema extends anterior into the basal ganglia and external capsule region, which could either be extension of edema or slight extension of the infarction itself. These results will be called to the ordering clinician or representative by the Radiologist Assistant, and communication documented in the PACS or [employer]. Electronically Signed   By: Nelson Chimes M.D.   On: 09/10/2019 16:57    Assessment & Plan:   Diagnoses and all orders for this visit:  Slow transit constipation -      lactulose (CHRONULAC) 10 GM/15ML solution; Take 45-90 mLs (30-60 g total) by mouth 2 (two) times daily as needed for mild constipation or moderate constipation.  Other orders -     pantoprazole (PROTONIX) 40 MG tablet; Take 1 tablet (40 mg total) by mouth daily.     Meds ordered this encounter  Medications  . lactulose (CHRONULAC) 10 GM/15ML solution    Sig: Take 45-90 mLs (30-60 g total) by mouth 2 (two) times daily as needed for mild constipation or moderate constipation.    Dispense:  1000 mL    Refill:  5  . pantoprazole (PROTONIX) 40 MG tablet    Sig: Take 1 tablet (40 mg total) by mouth daily.    Dispense:  90 tablet    Refill:  3     Follow-up: No follow-ups on file.  Walker Kehr, MD

## 2020-01-02 NOTE — Assessment & Plan Note (Signed)
Home PT/OT/ST Dr Tessa Lerner - PMR

## 2020-01-02 NOTE — Addendum Note (Signed)
Addended by: Cresenciano Lick on: 01/02/2020 01:51 PM   Modules accepted: Orders

## 2020-01-02 NOTE — Patient Instructions (Signed)
[employer] [phone]) Visit the [facility] 4.0 out of 5 stars    584 ratings  278 answered questions Currently unavailable. We don't know when or if this item will be back in stock. Brand Bose Color [employer] Name Bose Hearphones conversation-enhancing headphone Form Factor In Ear About this item Wireless conversation enhancing headphones help you hear in louder environments by bringing into focus the voices you want to hear and reducing the noises you don't Always on active noise reduction Quiets your surroundings, while directional microphones in each earbud help you focus on the sounds in front of you or all around you Personalize your listening experience with settings such as bass and treble, world volume and microphone directivity within the [place] hear app. To take advantage of the conversation enhancing benefits of Hear phones a Smart Phone is required Soft stay hear+ tips stay secure and comfortable, and a lightweight neckband conforms to your body for all day wear Up to 10 hours of battery life from a rechargeable lithium ion battery.

## 2020-01-02 NOTE — Assessment & Plan Note (Addendum)
Home PT/OT/ST Dr Tessa Lerner - PMR

## 2020-01-02 NOTE — Assessment & Plan Note (Signed)
[facility] PT/OT/ST Dr Tessa Lerner - PMR

## 2020-01-03 ENCOUNTER — Encounter: Payer: Self-pay | Admitting: Internal Medicine

## 2020-01-03 ENCOUNTER — Telehealth: Payer: Self-pay

## 2020-01-03 ENCOUNTER — Other Ambulatory Visit: Payer: Self-pay | Admitting: Internal Medicine

## 2020-01-03 ENCOUNTER — Telehealth: Payer: Self-pay | Admitting: *Deleted

## 2020-01-03 DIAGNOSIS — R7989 Other specified abnormal findings of blood chemistry: Secondary | ICD-10-CM | POA: Insufficient documentation

## 2020-01-03 DIAGNOSIS — F329 Major depressive disorder, single episode, unspecified: Secondary | ICD-10-CM | POA: Diagnosis not present

## 2020-01-03 DIAGNOSIS — I69391 Dysphagia following cerebral infarction: Secondary | ICD-10-CM | POA: Diagnosis not present

## 2020-01-03 DIAGNOSIS — I10 Essential (primary) hypertension: Secondary | ICD-10-CM | POA: Diagnosis not present

## 2020-01-03 DIAGNOSIS — E119 Type 2 diabetes mellitus without complications: Secondary | ICD-10-CM | POA: Diagnosis not present

## 2020-01-03 DIAGNOSIS — R1310 Dysphagia, unspecified: Secondary | ICD-10-CM | POA: Diagnosis not present

## 2020-01-03 DIAGNOSIS — I69351 Hemiplegia and hemiparesis following cerebral infarction affecting right dominant side: Secondary | ICD-10-CM | POA: Diagnosis not present

## 2020-01-03 DIAGNOSIS — D751 Secondary polycythemia: Secondary | ICD-10-CM

## 2020-01-03 LAB — LIPID PANEL
Cholesterol: 189 mg/dL (ref <200)
HDL: 43 mg/dL (ref >=40)
LDL Cholesterol (Calc): 122 mg/dL (calc) — ABNORMAL HIGH
Non-HDL Cholesterol (Calc): 146 mg/dL (calc) — ABNORMAL HIGH (ref <130)
Total CHOL/HDL Ratio: 4.4 (calc) (ref <5.0)
Triglycerides: 125 mg/dL (ref <150)

## 2020-01-03 LAB — CBC WITH DIFFERENTIAL/PLATELET
Absolute Monocytes: 654 cells/uL (ref 200–950)
Basophils Absolute: 43 cells/uL (ref 0–200)
Basophils Relative: 0.5 %
Eosinophils Absolute: 120 cells/uL (ref 15–500)
Eosinophils Relative: 1.4 %
HCT: 52.3 % — ABNORMAL HIGH (ref 38.5–50.0)
Hemoglobin: 17.9 g/dL — ABNORMAL HIGH (ref 13.2–17.1)
Lymphs Abs: 2055 cells/uL (ref 850–3900)
MCH: 32 pg (ref 27.0–33.0)
MCHC: 34.2 g/dL (ref 32.0–36.0)
MCV: 93.6 fL (ref 80.0–100.0)
MPV: 12.5 fL (ref 7.5–12.5)
Monocytes Relative: 7.6 %
Neutro Abs: 5728 cells/uL (ref 1500–7800)
Neutrophils Relative %: 66.6 %
Platelets: 129 10*3/uL — ABNORMAL LOW (ref 140–400)
RBC: 5.59 10*6/uL (ref 4.20–5.80)
RDW: 12.6 % (ref 11.0–15.0)
Total Lymphocyte: 23.9 %
WBC: 8.6 10*3/uL (ref 3.8–10.8)

## 2020-01-03 LAB — TSH: TSH: 1.63 mIU/L (ref 0.40–4.50)

## 2020-01-03 NOTE — Telephone Encounter (Signed)
We have gotten calls from OT PT too but reading the notes it looks like [employer] ordered the [facility] not [place] so they should be the ones called.

## 2020-01-03 NOTE — Telephone Encounter (Signed)
Christy, RN/[facility] called and is requesting orders for ST eval for dysphagia. Is this okay?

## 2020-01-03 NOTE — Telephone Encounter (Signed)
Received calls for [facility] PT/OT/ST services for Mr Coke. I have called them back and let them know that Dr Naaman Plummer did not initiate the services, [facility] did and they should call the PCP.

## 2020-01-04 NOTE — Telephone Encounter (Signed)
i'm happy to write for SLP if needed, but I agree that [facility] should write for ST too

## 2020-01-06 NOTE — Telephone Encounter (Signed)
Notified HHRN 

## 2020-01-08 DIAGNOSIS — E119 Type 2 diabetes mellitus without complications: Secondary | ICD-10-CM | POA: Diagnosis not present

## 2020-01-08 DIAGNOSIS — F329 Major depressive disorder, single episode, unspecified: Secondary | ICD-10-CM | POA: Diagnosis not present

## 2020-01-08 DIAGNOSIS — I10 Essential (primary) hypertension: Secondary | ICD-10-CM | POA: Diagnosis not present

## 2020-01-08 DIAGNOSIS — R1310 Dysphagia, unspecified: Secondary | ICD-10-CM | POA: Diagnosis not present

## 2020-01-08 DIAGNOSIS — I69391 Dysphagia following cerebral infarction: Secondary | ICD-10-CM | POA: Diagnosis not present

## 2020-01-08 DIAGNOSIS — I69351 Hemiplegia and hemiparesis following cerebral infarction affecting right dominant side: Secondary | ICD-10-CM | POA: Diagnosis not present

## 2020-01-09 DIAGNOSIS — R1310 Dysphagia, unspecified: Secondary | ICD-10-CM | POA: Diagnosis not present

## 2020-01-09 DIAGNOSIS — I69351 Hemiplegia and hemiparesis following cerebral infarction affecting right dominant side: Secondary | ICD-10-CM | POA: Diagnosis not present

## 2020-01-09 DIAGNOSIS — I69391 Dysphagia following cerebral infarction: Secondary | ICD-10-CM | POA: Diagnosis not present

## 2020-01-09 DIAGNOSIS — I10 Essential (primary) hypertension: Secondary | ICD-10-CM | POA: Diagnosis not present

## 2020-01-09 DIAGNOSIS — F329 Major depressive disorder, single episode, unspecified: Secondary | ICD-10-CM | POA: Diagnosis not present

## 2020-01-09 DIAGNOSIS — E119 Type 2 diabetes mellitus without complications: Secondary | ICD-10-CM | POA: Diagnosis not present

## 2020-01-09 NOTE — Progress Notes (Signed)
Order(s) created erroneously. Erroneous order ID: [id number]  Order moved by: Pete Pelt  Order move date/time: 01/09/2020 10:23 PM  Source Patient: [id number]  Source Contact: 12/18/2019  Destination Patient: [id number]  Destination Contact: 12/28/2016

## 2020-01-10 ENCOUNTER — Telehealth: Payer: Self-pay | Admitting: *Deleted

## 2020-01-10 DIAGNOSIS — F329 Major depressive disorder, single episode, unspecified: Secondary | ICD-10-CM | POA: Diagnosis not present

## 2020-01-10 DIAGNOSIS — I69351 Hemiplegia and hemiparesis following cerebral infarction affecting right dominant side: Secondary | ICD-10-CM | POA: Diagnosis not present

## 2020-01-10 DIAGNOSIS — R1310 Dysphagia, unspecified: Secondary | ICD-10-CM | POA: Diagnosis not present

## 2020-01-10 DIAGNOSIS — I10 Essential (primary) hypertension: Secondary | ICD-10-CM | POA: Diagnosis not present

## 2020-01-10 DIAGNOSIS — I69391 Dysphagia following cerebral infarction: Secondary | ICD-10-CM | POA: Diagnosis not present

## 2020-01-10 DIAGNOSIS — E119 Type 2 diabetes mellitus without complications: Secondary | ICD-10-CM | POA: Diagnosis not present

## 2020-01-10 NOTE — Telephone Encounter (Signed)
Christy RN HH called from [employer] with multiple requests. She is requesting a ST for cognition, gel topped mattress for a hospital bed, and a diagnosis code for a STG 2 pressure wound on sacrum and would orders ( they prefer Ca Alginate with silver and bordered foam.  I called her back again and told her that we did not initiate these orders but they were initiated by [facility] and that it should be handled by the primary care provider.  She is saying they are not assuming responsibility either.  We discharged him from [facility] in May and have only sen him for botox tx,  I advised her that DR Naaman Plummer has agreed with not being responsible in a recent phone message on 01/03/20.  I have advised her all I can do is send this to him but he is out of the office and hospital today, and it will be Monday before he responds.

## 2020-01-13 DIAGNOSIS — I69391 Dysphagia following cerebral infarction: Secondary | ICD-10-CM | POA: Diagnosis not present

## 2020-01-13 DIAGNOSIS — I69351 Hemiplegia and hemiparesis following cerebral infarction affecting right dominant side: Secondary | ICD-10-CM | POA: Diagnosis not present

## 2020-01-13 DIAGNOSIS — F329 Major depressive disorder, single episode, unspecified: Secondary | ICD-10-CM | POA: Diagnosis not present

## 2020-01-13 DIAGNOSIS — R1310 Dysphagia, unspecified: Secondary | ICD-10-CM | POA: Diagnosis not present

## 2020-01-13 DIAGNOSIS — I10 Essential (primary) hypertension: Secondary | ICD-10-CM | POA: Diagnosis not present

## 2020-01-13 DIAGNOSIS — E119 Type 2 diabetes mellitus without complications: Secondary | ICD-10-CM | POA: Diagnosis not present

## 2020-01-13 NOTE — Telephone Encounter (Signed)
I'm happy to write orders for SLP and requested hospital bed. However I have NEVER seen this wound or treated and am not comfortable to prescribe wound care as such.  I don't think that's unreasonable.

## 2020-01-14 DIAGNOSIS — I69351 Hemiplegia and hemiparesis following cerebral infarction affecting right dominant side: Secondary | ICD-10-CM | POA: Diagnosis not present

## 2020-01-14 DIAGNOSIS — E119 Type 2 diabetes mellitus without complications: Secondary | ICD-10-CM | POA: Diagnosis not present

## 2020-01-14 DIAGNOSIS — R1310 Dysphagia, unspecified: Secondary | ICD-10-CM | POA: Diagnosis not present

## 2020-01-14 DIAGNOSIS — I69391 Dysphagia following cerebral infarction: Secondary | ICD-10-CM | POA: Diagnosis not present

## 2020-01-14 DIAGNOSIS — I10 Essential (primary) hypertension: Secondary | ICD-10-CM | POA: Diagnosis not present

## 2020-01-14 DIAGNOSIS — F329 Major depressive disorder, single episode, unspecified: Secondary | ICD-10-CM | POA: Diagnosis not present

## 2020-01-15 NOTE — Telephone Encounter (Signed)
I let her know of Dr Charm Barges reply.

## 2020-01-16 DIAGNOSIS — I69391 Dysphagia following cerebral infarction: Secondary | ICD-10-CM | POA: Diagnosis not present

## 2020-01-16 DIAGNOSIS — F329 Major depressive disorder, single episode, unspecified: Secondary | ICD-10-CM | POA: Diagnosis not present

## 2020-01-16 DIAGNOSIS — R1310 Dysphagia, unspecified: Secondary | ICD-10-CM | POA: Diagnosis not present

## 2020-01-16 DIAGNOSIS — I69351 Hemiplegia and hemiparesis following cerebral infarction affecting right dominant side: Secondary | ICD-10-CM | POA: Diagnosis not present

## 2020-01-16 DIAGNOSIS — I10 Essential (primary) hypertension: Secondary | ICD-10-CM | POA: Diagnosis not present

## 2020-01-16 DIAGNOSIS — E119 Type 2 diabetes mellitus without complications: Secondary | ICD-10-CM | POA: Diagnosis not present

## 2020-01-17 ENCOUNTER — Telehealth: Payer: Self-pay | Admitting: Internal Medicine

## 2020-01-17 DIAGNOSIS — R1310 Dysphagia, unspecified: Secondary | ICD-10-CM | POA: Diagnosis not present

## 2020-01-17 DIAGNOSIS — F329 Major depressive disorder, single episode, unspecified: Secondary | ICD-10-CM | POA: Diagnosis not present

## 2020-01-17 DIAGNOSIS — I69351 Hemiplegia and hemiparesis following cerebral infarction affecting right dominant side: Secondary | ICD-10-CM | POA: Diagnosis not present

## 2020-01-17 DIAGNOSIS — F321 Major depressive disorder, single episode, moderate: Secondary | ICD-10-CM

## 2020-01-17 DIAGNOSIS — I10 Essential (primary) hypertension: Secondary | ICD-10-CM | POA: Diagnosis not present

## 2020-01-17 DIAGNOSIS — E119 Type 2 diabetes mellitus without complications: Secondary | ICD-10-CM | POA: Diagnosis not present

## 2020-01-17 DIAGNOSIS — I69391 Dysphagia following cerebral infarction: Secondary | ICD-10-CM | POA: Diagnosis not present

## 2020-01-17 NOTE — Telephone Encounter (Signed)
Pt requesting medications that were given by another provider. Please advise

## 2020-01-17 NOTE — Telephone Encounter (Signed)
Patient is requesting a refill on the following medication : Patient was in the nursing home and now they need Dr.Plotnikov to call these in.  escitalopram (LEXAPRO) 5 MG tablet  & amantadine ([place]) 50 MG/5ML solution  [id number] [place] [address], [place] Phone:  [phone]  Fax:  [phone]

## 2020-01-21 DIAGNOSIS — R1310 Dysphagia, unspecified: Secondary | ICD-10-CM | POA: Diagnosis not present

## 2020-01-21 DIAGNOSIS — I10 Essential (primary) hypertension: Secondary | ICD-10-CM | POA: Diagnosis not present

## 2020-01-21 DIAGNOSIS — E119 Type 2 diabetes mellitus without complications: Secondary | ICD-10-CM | POA: Diagnosis not present

## 2020-01-21 DIAGNOSIS — I69391 Dysphagia following cerebral infarction: Secondary | ICD-10-CM | POA: Diagnosis not present

## 2020-01-21 DIAGNOSIS — I69351 Hemiplegia and hemiparesis following cerebral infarction affecting right dominant side: Secondary | ICD-10-CM | POA: Diagnosis not present

## 2020-01-21 DIAGNOSIS — F329 Major depressive disorder, single episode, unspecified: Secondary | ICD-10-CM | POA: Diagnosis not present

## 2020-01-22 DIAGNOSIS — F329 Major depressive disorder, single episode, unspecified: Secondary | ICD-10-CM | POA: Diagnosis not present

## 2020-01-22 DIAGNOSIS — I10 Essential (primary) hypertension: Secondary | ICD-10-CM | POA: Diagnosis not present

## 2020-01-22 DIAGNOSIS — E119 Type 2 diabetes mellitus without complications: Secondary | ICD-10-CM | POA: Diagnosis not present

## 2020-01-22 DIAGNOSIS — R1310 Dysphagia, unspecified: Secondary | ICD-10-CM | POA: Diagnosis not present

## 2020-01-22 DIAGNOSIS — I69351 Hemiplegia and hemiparesis following cerebral infarction affecting right dominant side: Secondary | ICD-10-CM | POA: Diagnosis not present

## 2020-01-22 DIAGNOSIS — I69391 Dysphagia following cerebral infarction: Secondary | ICD-10-CM | POA: Diagnosis not present

## 2020-01-23 ENCOUNTER — Telehealth (HOSPITAL_COMMUNITY): Payer: Self-pay | Admitting: Cardiovascular Disease

## 2020-01-23 MED ORDER — AMANTADINE HCL 50 MG/5ML PO SYRP: 100.0000 mg | ORAL_SOLUTION | Freq: Two times a day (BID) | ORAL | 5 refills | Status: DC

## 2020-01-23 MED ORDER — ESCITALOPRAM OXALATE 5 MG PO TABS: 5.0000 mg | ORAL_TABLET | Freq: Every day | ORAL | 5 refills | Status: DC

## 2020-01-23 NOTE — Telephone Encounter (Signed)
Will forward this message to Dr. Claiborne Billings and is primary covering RN as a general FYI.

## 2020-01-23 NOTE — Telephone Encounter (Signed)
noted 

## 2020-01-23 NOTE — Telephone Encounter (Signed)
Ok done Thx 

## 2020-01-23 NOTE — Telephone Encounter (Signed)
See 01/23/2020 refills

## 2020-01-23 NOTE — Telephone Encounter (Signed)
Patients daughter called and cancelled echocardiogram due to father is paralyzed due to massive stroke. Order will be removed from the WQ.

## 2020-01-24 DIAGNOSIS — E119 Type 2 diabetes mellitus without complications: Secondary | ICD-10-CM | POA: Diagnosis not present

## 2020-01-24 DIAGNOSIS — I69391 Dysphagia following cerebral infarction: Secondary | ICD-10-CM | POA: Diagnosis not present

## 2020-01-24 DIAGNOSIS — I69351 Hemiplegia and hemiparesis following cerebral infarction affecting right dominant side: Secondary | ICD-10-CM | POA: Diagnosis not present

## 2020-01-24 DIAGNOSIS — R1310 Dysphagia, unspecified: Secondary | ICD-10-CM | POA: Diagnosis not present

## 2020-01-24 DIAGNOSIS — I10 Essential (primary) hypertension: Secondary | ICD-10-CM | POA: Diagnosis not present

## 2020-01-24 DIAGNOSIS — F329 Major depressive disorder, single episode, unspecified: Secondary | ICD-10-CM | POA: Diagnosis not present

## 2020-01-27 DIAGNOSIS — I69391 Dysphagia following cerebral infarction: Secondary | ICD-10-CM | POA: Diagnosis not present

## 2020-01-27 DIAGNOSIS — D696 Thrombocytopenia, unspecified: Secondary | ICD-10-CM | POA: Diagnosis not present

## 2020-01-27 DIAGNOSIS — J449 Chronic obstructive pulmonary disease, unspecified: Secondary | ICD-10-CM | POA: Diagnosis not present

## 2020-01-27 DIAGNOSIS — R1310 Dysphagia, unspecified: Secondary | ICD-10-CM | POA: Diagnosis not present

## 2020-01-27 DIAGNOSIS — M1990 Unspecified osteoarthritis, unspecified site: Secondary | ICD-10-CM | POA: Diagnosis not present

## 2020-01-27 DIAGNOSIS — F0789 Other personality and behavioral disorders due to known physiological condition: Secondary | ICD-10-CM | POA: Diagnosis not present

## 2020-01-27 DIAGNOSIS — M545 Low back pain: Secondary | ICD-10-CM | POA: Diagnosis not present

## 2020-01-27 DIAGNOSIS — Z9181 History of falling: Secondary | ICD-10-CM | POA: Diagnosis not present

## 2020-01-27 DIAGNOSIS — I10 Essential (primary) hypertension: Secondary | ICD-10-CM | POA: Diagnosis not present

## 2020-01-27 DIAGNOSIS — K219 Gastro-esophageal reflux disease without esophagitis: Secondary | ICD-10-CM | POA: Diagnosis not present

## 2020-01-27 DIAGNOSIS — Z952 Presence of prosthetic heart valve: Secondary | ICD-10-CM | POA: Diagnosis not present

## 2020-01-27 DIAGNOSIS — I69351 Hemiplegia and hemiparesis following cerebral infarction affecting right dominant side: Secondary | ICD-10-CM | POA: Diagnosis not present

## 2020-01-27 DIAGNOSIS — I2510 Atherosclerotic heart disease of native coronary artery without angina pectoris: Secondary | ICD-10-CM | POA: Diagnosis not present

## 2020-01-27 DIAGNOSIS — F329 Major depressive disorder, single episode, unspecified: Secondary | ICD-10-CM | POA: Diagnosis not present

## 2020-01-27 DIAGNOSIS — E119 Type 2 diabetes mellitus without complications: Secondary | ICD-10-CM | POA: Diagnosis not present

## 2020-01-27 DIAGNOSIS — I69328 Other speech and language deficits following cerebral infarction: Secondary | ICD-10-CM | POA: Diagnosis not present

## 2020-01-27 DIAGNOSIS — Z87891 Personal history of nicotine dependence: Secondary | ICD-10-CM | POA: Diagnosis not present

## 2020-01-27 DIAGNOSIS — E785 Hyperlipidemia, unspecified: Secondary | ICD-10-CM | POA: Diagnosis not present

## 2020-01-27 DIAGNOSIS — D485 Neoplasm of uncertain behavior of skin: Secondary | ICD-10-CM | POA: Diagnosis not present

## 2020-01-27 DIAGNOSIS — H8110 Benign paroxysmal vertigo, unspecified ear: Secondary | ICD-10-CM | POA: Diagnosis not present

## 2020-01-28 ENCOUNTER — Other Ambulatory Visit (HOSPITAL_COMMUNITY): Payer: Medicare Other

## 2020-01-29 ENCOUNTER — Telehealth: Payer: Self-pay | Admitting: Internal Medicine

## 2020-01-29 ENCOUNTER — Encounter: Payer: Self-pay | Admitting: Internal Medicine

## 2020-01-29 ENCOUNTER — Ambulatory Visit: Payer: Medicare Other | Admitting: Internal Medicine

## 2020-01-29 ENCOUNTER — Ambulatory Visit (INDEPENDENT_AMBULATORY_CARE_PROVIDER_SITE_OTHER): Payer: Medicare Other | Admitting: Internal Medicine

## 2020-01-29 VITALS — BP 90/50 | HR 81 | Temp 97.7°F | Ht 71.0 in | Wt 190.0 lb

## 2020-01-29 DIAGNOSIS — D751 Secondary polycythemia: Secondary | ICD-10-CM | POA: Diagnosis not present

## 2020-01-29 DIAGNOSIS — F321 Major depressive disorder, single episode, moderate: Secondary | ICD-10-CM

## 2020-01-29 DIAGNOSIS — J449 Chronic obstructive pulmonary disease, unspecified: Secondary | ICD-10-CM | POA: Diagnosis not present

## 2020-01-29 DIAGNOSIS — L859 Epidermal thickening, unspecified: Secondary | ICD-10-CM | POA: Diagnosis not present

## 2020-01-29 DIAGNOSIS — F329 Major depressive disorder, single episode, unspecified: Secondary | ICD-10-CM | POA: Diagnosis not present

## 2020-01-29 DIAGNOSIS — R7989 Other specified abnormal findings of blood chemistry: Secondary | ICD-10-CM | POA: Diagnosis not present

## 2020-01-29 DIAGNOSIS — M545 Low back pain, unspecified: Secondary | ICD-10-CM

## 2020-01-29 DIAGNOSIS — G8111 Spastic hemiplegia affecting right dominant side: Secondary | ICD-10-CM

## 2020-01-29 DIAGNOSIS — E785 Hyperlipidemia, unspecified: Secondary | ICD-10-CM

## 2020-01-29 DIAGNOSIS — I610 Nontraumatic intracerebral hemorrhage in hemisphere, subcortical: Secondary | ICD-10-CM

## 2020-01-29 DIAGNOSIS — R1310 Dysphagia, unspecified: Secondary | ICD-10-CM | POA: Diagnosis not present

## 2020-01-29 DIAGNOSIS — Z23 Encounter for immunization: Secondary | ICD-10-CM | POA: Diagnosis not present

## 2020-01-29 DIAGNOSIS — I69391 Dysphagia following cerebral infarction: Secondary | ICD-10-CM

## 2020-01-29 DIAGNOSIS — I69351 Hemiplegia and hemiparesis following cerebral infarction affecting right dominant side: Secondary | ICD-10-CM | POA: Diagnosis not present

## 2020-01-29 DIAGNOSIS — I10 Essential (primary) hypertension: Secondary | ICD-10-CM | POA: Diagnosis not present

## 2020-01-29 DIAGNOSIS — E119 Type 2 diabetes mellitus without complications: Secondary | ICD-10-CM | POA: Diagnosis not present

## 2020-01-29 DIAGNOSIS — I2510 Atherosclerotic heart disease of native coronary artery without angina pectoris: Secondary | ICD-10-CM | POA: Diagnosis not present

## 2020-01-29 LAB — CBC WITH DIFFERENTIAL/PLATELET
Absolute Monocytes: 660 cells/uL (ref 200–950)
Basophils Absolute: 30 cells/uL (ref 0–200)
Basophils Relative: 0.4 %
Eosinophils Absolute: 233 cells/uL (ref 15–500)
Eosinophils Relative: 3.1 %
HCT: 52 % — ABNORMAL HIGH (ref 38.5–50.0)
Hemoglobin: 17.9 g/dL — ABNORMAL HIGH (ref 13.2–17.1)
Lymphs Abs: 2153 cells/uL (ref 850–3900)
MCH: 31.9 pg (ref 27.0–33.0)
MCHC: 34.4 g/dL (ref 32.0–36.0)
MCV: 92.7 fL (ref 80.0–100.0)
MPV: 12.4 fL (ref 7.5–12.5)
Monocytes Relative: 8.8 %
Neutro Abs: 4425 cells/uL (ref 1500–7800)
Neutrophils Relative %: 59 %
Platelets: 122 10*3/uL — ABNORMAL LOW (ref 140–400)
RBC: 5.61 10*6/uL (ref 4.20–5.80)
RDW: 13.1 % (ref 11.0–15.0)
Total Lymphocyte: 28.7 %
WBC: 7.5 10*3/uL (ref 3.8–10.8)

## 2020-01-29 NOTE — Progress Notes (Signed)
Subjective:  Patient ID: David [name], male    DOB: [birth year]  Age: 86 y.o. MRN: [id number]  CC: No chief complaint on file.   HPI David [name] presents for CVA, paralysis, anxiety C/o rough skin on hands  Outpatient Medications Prior to Visit  Medication Sig Dispense Refill  . acetaminophen (TYLENOL) 325 MG tablet Take 1-2 tablets (325-650 mg total) by mouth every 4 (four) hours as needed for mild pain.    Marland Kitchen amantadine (SYMMETREL) 50 MG/5ML solution Take 10 mLs (100 mg total) by mouth 2 (two) times daily. 473 mL 5  . atenolol (TENORMIN) 25 MG tablet Take 1 tablet (25 mg total) by mouth daily. 30 tablet 0  . escitalopram (LEXAPRO) 5 MG tablet Take 1 tablet (5 mg total) by mouth at bedtime. 30 tablet 5  . ezetimibe (ZETIA) 10 MG tablet Take 1 tablet (10 mg total) by mouth daily. 30 tablet 0  . lactulose (CHRONULAC) 10 GM/15ML solution 30-60 ml bid po prn 1000 mL 5  . pantoprazole (PROTONIX) 40 MG tablet Take 1 tablet (40 mg total) by mouth daily. 90 tablet 3  . polyethylene glycol (MIRALAX / GLYCOLAX) 17 g packet Take 17 g by mouth daily.    Marland Kitchen senna-docusate (SENOKOT-S) 8.6-50 MG tablet Take 2 tablets by mouth 2 (two) times daily. 60 tablet 0   No facility-administered medications prior to visit.    ROS: Review of Systems  Constitutional: Positive for fatigue. Negative for appetite change and unexpected weight change.  HENT: Negative for congestion, nosebleeds, sneezing, sore throat and trouble swallowing.   Eyes: Negative for itching and visual disturbance.  Respiratory: Negative for cough.   Cardiovascular: Negative for chest pain, palpitations and leg swelling.  Gastrointestinal: Negative for abdominal distention, blood in stool, diarrhea and nausea.  Genitourinary: Negative for frequency and hematuria.  Musculoskeletal: Positive for gait problem. Negative for back pain, joint swelling and neck pain.  Skin: Negative for rash.  Neurological: Positive for weakness. Negative  for dizziness, tremors and speech difficulty.  Hematological: Bruises/bleeds easily.  Psychiatric/Behavioral: Positive for decreased concentration. Negative for agitation, dysphoric mood and sleep disturbance. The patient is not nervous/anxious.     Objective:  BP (!) 90/50 (BP Location: Left Arm, Patient Position: Sitting, Cuff Size: Large)   Pulse 81   Temp 97.7 F (36.5 C) (Oral)   Ht 5\' 11"  (1.803 m)   Wt 190 lb (86.2 kg)   SpO2 99%   BMI 26.50 kg/m   BP Readings from Last 3 Encounters:  01/29/20 (!) 90/50  01/02/20 120/76  12/25/19 128/69    Wt Readings from Last 3 Encounters:  01/29/20 190 lb (86.2 kg)  12/18/19 192 lb (87.1 kg)  11/11/19 190 lb (86.2 kg)    Physical Exam Constitutional:      General: He is not in acute distress.    Appearance: He is well-developed. He is obese.     Comments: NAD  Eyes:     Conjunctiva/sclera: Conjunctivae normal.     Pupils: Pupils are equal, round, and reactive to light.  Neck:     Thyroid: No thyromegaly.     Vascular: No JVD.  Cardiovascular:     Rate and Rhythm: Normal rate and regular rhythm.     Heart sounds: Normal heart sounds. No murmur heard.  No friction rub. No gallop.   Pulmonary:     Effort: Pulmonary effort is normal. No respiratory distress.     Breath sounds: Normal breath sounds. No wheezing  or rales.  Chest:     Chest wall: No tenderness.  Abdominal:     General: Bowel sounds are normal. There is no distension.     Palpations: Abdomen is soft. There is no mass.     Tenderness: There is no abdominal tenderness. There is no guarding or rebound.  Musculoskeletal:        General: No tenderness. Normal range of motion.     Cervical back: Normal range of motion.  Lymphadenopathy:     Cervical: No cervical adenopathy.  Skin:    General: Skin is warm and dry.     Findings: No rash.  Neurological:     Mental Status: He is alert and oriented to person, place, and time.     Cranial Nerves: No cranial nerve  deficit.     Motor: Weakness present. No abnormal muscle tone.     Coordination: Coordination abnormal.     Gait: Gait abnormal.     Deep Tendon Reflexes: Reflexes are normal and symmetric.  Psychiatric:        Behavior: Behavior normal.        Thought Content: Thought content normal.        Judgment: Judgment normal.    R hemiparesis W/c Buttock ulcer Decreased BS at bases  Dysarthric    Time >45 min: time was spent on coordination of care, reviewing past records, lab results and X rays; educating the patient and dtr, home care discussion.   Lab Results  Component Value Date   WBC 8.6 01/02/2020   HGB 17.9 (H) 01/02/2020   HCT 52.3 (H) 01/02/2020   PLT 129 (L) 01/02/2020   GLUCOSE 145 (H) 01/02/2020   CHOL 189 01/02/2020   TRIG 125 01/02/2020   HDL 43 01/02/2020   LDLDIRECT 136.2 08/01/2007   LDLCALC 122 (H) 01/02/2020   ALT 52 (H) 01/02/2020   AST 45 (H) 01/02/2020   NA 138 01/02/2020   K 4.6 01/02/2020   CL 100 01/02/2020   CREATININE 1.16 (H) 01/02/2020   BUN 12 01/02/2020   CO2 28 01/02/2020   TSH 1.63 01/02/2020   PSA 4.88 (H) 05/20/2019   INR 1.2 09/06/2019   HGBA1C 5.9 (H) 09/04/2019    DG Chest 2 View  Result Date: 09/10/2019 CLINICAL DATA:  Fever EXAM: CHEST - 2 VIEW COMPARISON:  09/04/2019 FINDINGS: Prior CABG and aortic valve replacement. The heart size and mediastinal contours are stable. Low lung volumes. Increased interstitial markings within the left lung base, slightly progressed from prior. No pneumothorax. IMPRESSION: Increased interstitial markings within the left lung base, slightly progressed from prior. Findings could represent atelectasis versus pneumonia. Electronically Signed   By: Davina Poke D.O.   On: 09/10/2019 16:33   DG Abd 1 View  Result Date: 09/10/2019 CLINICAL DATA:  Abdominal distension. EXAM: ABDOMEN - 1 VIEW COMPARISON:  None. FINDINGS: The bowel gas pattern is normal. Residual contrast is noted in the colon. No  radio-opaque calculi or other significant radiographic abnormality are seen. IMPRESSION: No evidence of bowel obstruction or ileus. Electronically Signed   By: Marijo Conception M.D.   On: 09/10/2019 16:32   CT HEAD WO CONTRAST  Result Date: 09/10/2019 CLINICAL DATA:  Follow-up left basal ganglia hemorrhage. Increased lethargy. EXAM: CT HEAD WITHOUT CONTRAST TECHNIQUE: Contiguous axial images were obtained from the base of the skull through the vertex without intravenous contrast. COMPARISON:  09/04/2019 FINDINGS: Brain: Slight enlargement of the left basal ganglia intraparenchymal hemorrhage, measuring 4.4 x 2.5  x 3.6 cm (volume = 21 cm^3) today compared with 16 cc volume 6 days ago. Additionally, there is intraventricular penetration, having breach the atrium of the left lateral ventricle, with a small amount of blood dependent in both occipital horns. Ventricular size remains stable however without hydrocephalus. Surrounding edema is slightly more pronounced. One could question if there has been extension of infarction more anteriorly within the basal ganglia and external capsule or if this relates to edema from the hemorrhage. Other small-vessel ischemic changes throughout brain appear stable. No new large vessel infarction. No extra-axial collection. Vascular: There is atherosclerotic calcification of the major vessels at the base of the brain. Skull: Negative Sinuses/Orbits: Clear/normal Other: None IMPRESSION: Slight enlargement of the left basal ganglia intraparenchymal hemorrhage when compared to the study of 6 days ago. Previous volume was calculated at 16 cc. Today calculated at 21 cc. There is also intraventricular penetration with a small amount of blood dependent in the occipital horns of both lateral ventricles but no hydrocephalus. Slightly more surrounding edema as expected. Some edema extends anterior into the basal ganglia and external capsule region, which could either be extension of edema or  slight extension of the infarction itself. These results will be called to the ordering clinician or representative by the Radiologist Assistant, and communication documented in the PACS or [employer]. Electronically Signed   By: Nelson Chimes M.D.   On: 09/10/2019 16:57    Assessment & Plan:    Walker Kehr, MD

## 2020-01-29 NOTE — Telephone Encounter (Signed)
    Kevin requesting order to extend home PT, 2x week for 3 weeks Also, requesting order for nursing eval for pressure wound care  Please call [phone], ok to leave message

## 2020-01-29 NOTE — Assessment & Plan Note (Signed)
OA. MSK - chronic

## 2020-01-29 NOTE — Assessment & Plan Note (Signed)
Get a flutter valve

## 2020-01-29 NOTE — Assessment & Plan Note (Signed)
ST No chocking

## 2020-01-29 NOTE — Assessment & Plan Note (Signed)
Lexapro 

## 2020-01-29 NOTE — Telephone Encounter (Signed)
Pt needs a referral to [facility] for orthotics care for right leg

## 2020-01-29 NOTE — Assessment & Plan Note (Signed)
PT/OT at home

## 2020-01-29 NOTE — Patient Instructions (Addendum)
Try GLYTONE exfoliating body lotion by Desmond Dike (free acid value 17.5)  Flutter valve

## 2020-01-29 NOTE — Assessment & Plan Note (Signed)
CBC

## 2020-01-29 NOTE — Assessment & Plan Note (Signed)
Zetia

## 2020-01-29 NOTE — Telephone Encounter (Signed)
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

## 2020-01-29 NOTE — Assessment & Plan Note (Signed)
CMET 

## 2020-01-29 NOTE — Assessment & Plan Note (Signed)
B hands Try GLYTONE exfoliating body lotion by Desmond Dike (free acid value 17.5)

## 2020-01-29 NOTE — Assessment & Plan Note (Signed)
PT/OT/ST at home Rx for lift chair, gel pad

## 2020-01-30 DIAGNOSIS — I10 Essential (primary) hypertension: Secondary | ICD-10-CM | POA: Diagnosis not present

## 2020-01-30 DIAGNOSIS — F329 Major depressive disorder, single episode, unspecified: Secondary | ICD-10-CM | POA: Diagnosis not present

## 2020-01-30 DIAGNOSIS — R1310 Dysphagia, unspecified: Secondary | ICD-10-CM | POA: Diagnosis not present

## 2020-01-30 DIAGNOSIS — I69391 Dysphagia following cerebral infarction: Secondary | ICD-10-CM | POA: Diagnosis not present

## 2020-01-30 DIAGNOSIS — I69351 Hemiplegia and hemiparesis following cerebral infarction affecting right dominant side: Secondary | ICD-10-CM | POA: Diagnosis not present

## 2020-01-30 DIAGNOSIS — E119 Type 2 diabetes mellitus without complications: Secondary | ICD-10-CM | POA: Diagnosis not present

## 2020-01-30 LAB — COMPLETE METABOLIC PANEL WITH GFR
AG Ratio: 1.6 (calc) (ref 1.0–2.5)
ALT: 20 U/L (ref 9–46)
AST: 17 U/L (ref 10–35)
Albumin: 4.2 g/dL (ref 3.6–5.1)
Alkaline phosphatase (APISO): 65 U/L (ref 35–144)
BUN: 12 mg/dL (ref 7–25)
CO2: 29 mmol/L (ref 20–32)
Calcium: 9.6 mg/dL (ref 8.6–10.3)
Chloride: 100 mmol/L (ref 98–110)
Creat: 1.11 mg/dL (ref 0.70–1.11)
GFR, Est African American: 66 mL/min/{1.73_m2} (ref >=60)
GFR, Est Non African American: 57 mL/min/{1.73_m2} — ABNORMAL LOW (ref >=60)
Globulin: 2.7 g/dL (calc) (ref 1.9–3.7)
Glucose, Bld: 136 mg/dL — ABNORMAL HIGH (ref 65–99)
Potassium: 4.4 mmol/L (ref 3.5–5.3)
Sodium: 138 mmol/L (ref 135–146)
Total Bilirubin: 0.9 mg/dL (ref 0.2–1.2)
Total Protein: 6.9 g/dL (ref 6.1–8.1)

## 2020-01-30 NOTE — Telephone Encounter (Signed)
Okay with me.  Please place the referral.  Thanks

## 2020-01-31 NOTE — Telephone Encounter (Signed)
    [facility] calling to extend order for home health assistant for 2x week for 3 weeks. Please call [place], ok to leave message [phone]

## 2020-02-02 DIAGNOSIS — I10 Essential (primary) hypertension: Secondary | ICD-10-CM | POA: Diagnosis not present

## 2020-02-02 DIAGNOSIS — E119 Type 2 diabetes mellitus without complications: Secondary | ICD-10-CM | POA: Diagnosis not present

## 2020-02-02 DIAGNOSIS — R1310 Dysphagia, unspecified: Secondary | ICD-10-CM | POA: Diagnosis not present

## 2020-02-02 DIAGNOSIS — I69351 Hemiplegia and hemiparesis following cerebral infarction affecting right dominant side: Secondary | ICD-10-CM | POA: Diagnosis not present

## 2020-02-02 DIAGNOSIS — I69391 Dysphagia following cerebral infarction: Secondary | ICD-10-CM | POA: Diagnosis not present

## 2020-02-02 DIAGNOSIS — F329 Major depressive disorder, single episode, unspecified: Secondary | ICD-10-CM | POA: Diagnosis not present

## 2020-02-03 DIAGNOSIS — I69351 Hemiplegia and hemiparesis following cerebral infarction affecting right dominant side: Secondary | ICD-10-CM | POA: Diagnosis not present

## 2020-02-03 DIAGNOSIS — E119 Type 2 diabetes mellitus without complications: Secondary | ICD-10-CM | POA: Diagnosis not present

## 2020-02-03 DIAGNOSIS — R1310 Dysphagia, unspecified: Secondary | ICD-10-CM | POA: Diagnosis not present

## 2020-02-03 DIAGNOSIS — F329 Major depressive disorder, single episode, unspecified: Secondary | ICD-10-CM | POA: Diagnosis not present

## 2020-02-03 DIAGNOSIS — I69391 Dysphagia following cerebral infarction: Secondary | ICD-10-CM | POA: Diagnosis not present

## 2020-02-03 DIAGNOSIS — I10 Essential (primary) hypertension: Secondary | ICD-10-CM | POA: Diagnosis not present

## 2020-02-03 NOTE — Telephone Encounter (Signed)
   David [name] requesting order for right side platform walker attachment. Please fax to [facility] at [phone]

## 2020-02-04 ENCOUNTER — Telehealth: Payer: Self-pay | Admitting: Internal Medicine

## 2020-02-04 NOTE — Telephone Encounter (Signed)
    Claiborne Billings from [facility] requesting verbal order for treatment of stage 2 pressure ulcer  Please call [phone]

## 2020-02-05 ENCOUNTER — Ambulatory Visit: Payer: Medicare Other | Admitting: Cardiovascular Disease

## 2020-02-05 ENCOUNTER — Other Ambulatory Visit: Payer: Self-pay | Admitting: Internal Medicine

## 2020-02-05 DIAGNOSIS — R1310 Dysphagia, unspecified: Secondary | ICD-10-CM | POA: Diagnosis not present

## 2020-02-05 DIAGNOSIS — F329 Major depressive disorder, single episode, unspecified: Secondary | ICD-10-CM | POA: Diagnosis not present

## 2020-02-05 DIAGNOSIS — E119 Type 2 diabetes mellitus without complications: Secondary | ICD-10-CM | POA: Diagnosis not present

## 2020-02-05 DIAGNOSIS — I69391 Dysphagia following cerebral infarction: Secondary | ICD-10-CM | POA: Diagnosis not present

## 2020-02-05 DIAGNOSIS — I69351 Hemiplegia and hemiparesis following cerebral infarction affecting right dominant side: Secondary | ICD-10-CM | POA: Diagnosis not present

## 2020-02-05 DIAGNOSIS — D751 Secondary polycythemia: Secondary | ICD-10-CM

## 2020-02-05 DIAGNOSIS — I10 Essential (primary) hypertension: Secondary | ICD-10-CM | POA: Diagnosis not present

## 2020-02-05 NOTE — Telephone Encounter (Signed)
OK. Thx

## 2020-02-06 DIAGNOSIS — I69351 Hemiplegia and hemiparesis following cerebral infarction affecting right dominant side: Secondary | ICD-10-CM | POA: Diagnosis not present

## 2020-02-06 DIAGNOSIS — R1310 Dysphagia, unspecified: Secondary | ICD-10-CM | POA: Diagnosis not present

## 2020-02-06 DIAGNOSIS — F329 Major depressive disorder, single episode, unspecified: Secondary | ICD-10-CM | POA: Diagnosis not present

## 2020-02-06 DIAGNOSIS — I69391 Dysphagia following cerebral infarction: Secondary | ICD-10-CM | POA: Diagnosis not present

## 2020-02-06 DIAGNOSIS — I10 Essential (primary) hypertension: Secondary | ICD-10-CM | POA: Diagnosis not present

## 2020-02-06 DIAGNOSIS — E119 Type 2 diabetes mellitus without complications: Secondary | ICD-10-CM | POA: Diagnosis not present

## 2020-02-06 NOTE — Telephone Encounter (Signed)
Ok Thx 

## 2020-02-07 ENCOUNTER — Other Ambulatory Visit: Payer: Self-pay

## 2020-02-07 DIAGNOSIS — R2681 Unsteadiness on feet: Secondary | ICD-10-CM

## 2020-02-07 DIAGNOSIS — I69351 Hemiplegia and hemiparesis following cerebral infarction affecting right dominant side: Secondary | ICD-10-CM | POA: Diagnosis not present

## 2020-02-07 DIAGNOSIS — E119 Type 2 diabetes mellitus without complications: Secondary | ICD-10-CM | POA: Diagnosis not present

## 2020-02-07 DIAGNOSIS — I10 Essential (primary) hypertension: Secondary | ICD-10-CM | POA: Diagnosis not present

## 2020-02-07 DIAGNOSIS — I69391 Dysphagia following cerebral infarction: Secondary | ICD-10-CM | POA: Diagnosis not present

## 2020-02-07 DIAGNOSIS — R1310 Dysphagia, unspecified: Secondary | ICD-10-CM | POA: Diagnosis not present

## 2020-02-07 DIAGNOSIS — F329 Major depressive disorder, single episode, unspecified: Secondary | ICD-10-CM | POA: Diagnosis not present

## 2020-02-07 NOTE — Telephone Encounter (Signed)
Left message for verbal orders.

## 2020-02-10 DIAGNOSIS — F329 Major depressive disorder, single episode, unspecified: Secondary | ICD-10-CM | POA: Diagnosis not present

## 2020-02-10 DIAGNOSIS — I69391 Dysphagia following cerebral infarction: Secondary | ICD-10-CM | POA: Diagnosis not present

## 2020-02-10 DIAGNOSIS — R1310 Dysphagia, unspecified: Secondary | ICD-10-CM | POA: Diagnosis not present

## 2020-02-10 DIAGNOSIS — I10 Essential (primary) hypertension: Secondary | ICD-10-CM | POA: Diagnosis not present

## 2020-02-10 DIAGNOSIS — I69351 Hemiplegia and hemiparesis following cerebral infarction affecting right dominant side: Secondary | ICD-10-CM | POA: Diagnosis not present

## 2020-02-10 DIAGNOSIS — E119 Type 2 diabetes mellitus without complications: Secondary | ICD-10-CM | POA: Diagnosis not present

## 2020-02-11 ENCOUNTER — Telehealth: Payer: Self-pay | Admitting: Internal Medicine

## 2020-02-11 DIAGNOSIS — I69351 Hemiplegia and hemiparesis following cerebral infarction affecting right dominant side: Secondary | ICD-10-CM | POA: Diagnosis not present

## 2020-02-11 DIAGNOSIS — F329 Major depressive disorder, single episode, unspecified: Secondary | ICD-10-CM | POA: Diagnosis not present

## 2020-02-11 DIAGNOSIS — R1310 Dysphagia, unspecified: Secondary | ICD-10-CM | POA: Diagnosis not present

## 2020-02-11 DIAGNOSIS — I10 Essential (primary) hypertension: Secondary | ICD-10-CM | POA: Diagnosis not present

## 2020-02-11 DIAGNOSIS — E119 Type 2 diabetes mellitus without complications: Secondary | ICD-10-CM | POA: Diagnosis not present

## 2020-02-11 DIAGNOSIS — I69391 Dysphagia following cerebral infarction: Secondary | ICD-10-CM | POA: Diagnosis not present

## 2020-02-11 NOTE — Telephone Encounter (Signed)
Left vm of verbal orders

## 2020-02-11 NOTE — Telephone Encounter (Signed)
Ok Thx 

## 2020-02-11 NOTE — Telephone Encounter (Signed)
   [name] from [place] calling to request verbal order to extend home OT 1X week for 7   Please call [phone], ok to leave message

## 2020-02-12 DIAGNOSIS — E119 Type 2 diabetes mellitus without complications: Secondary | ICD-10-CM | POA: Diagnosis not present

## 2020-02-12 DIAGNOSIS — I69351 Hemiplegia and hemiparesis following cerebral infarction affecting right dominant side: Secondary | ICD-10-CM | POA: Diagnosis not present

## 2020-02-12 DIAGNOSIS — I10 Essential (primary) hypertension: Secondary | ICD-10-CM | POA: Diagnosis not present

## 2020-02-12 DIAGNOSIS — F329 Major depressive disorder, single episode, unspecified: Secondary | ICD-10-CM | POA: Diagnosis not present

## 2020-02-12 DIAGNOSIS — R1310 Dysphagia, unspecified: Secondary | ICD-10-CM | POA: Diagnosis not present

## 2020-02-12 DIAGNOSIS — I69391 Dysphagia following cerebral infarction: Secondary | ICD-10-CM | POA: Diagnosis not present

## 2020-02-13 ENCOUNTER — Other Ambulatory Visit: Payer: Self-pay | Admitting: Family

## 2020-02-13 DIAGNOSIS — I10 Essential (primary) hypertension: Secondary | ICD-10-CM | POA: Diagnosis not present

## 2020-02-13 DIAGNOSIS — E119 Type 2 diabetes mellitus without complications: Secondary | ICD-10-CM | POA: Diagnosis not present

## 2020-02-13 DIAGNOSIS — R1310 Dysphagia, unspecified: Secondary | ICD-10-CM | POA: Diagnosis not present

## 2020-02-13 DIAGNOSIS — I69351 Hemiplegia and hemiparesis following cerebral infarction affecting right dominant side: Secondary | ICD-10-CM | POA: Diagnosis not present

## 2020-02-13 DIAGNOSIS — F329 Major depressive disorder, single episode, unspecified: Secondary | ICD-10-CM | POA: Diagnosis not present

## 2020-02-13 DIAGNOSIS — I69391 Dysphagia following cerebral infarction: Secondary | ICD-10-CM | POA: Diagnosis not present

## 2020-02-14 ENCOUNTER — Other Ambulatory Visit: Payer: Self-pay | Admitting: Internal Medicine

## 2020-02-14 DIAGNOSIS — F321 Major depressive disorder, single episode, moderate: Secondary | ICD-10-CM

## 2020-02-17 DIAGNOSIS — I10 Essential (primary) hypertension: Secondary | ICD-10-CM | POA: Diagnosis not present

## 2020-02-17 DIAGNOSIS — R1310 Dysphagia, unspecified: Secondary | ICD-10-CM | POA: Diagnosis not present

## 2020-02-17 DIAGNOSIS — I69351 Hemiplegia and hemiparesis following cerebral infarction affecting right dominant side: Secondary | ICD-10-CM | POA: Diagnosis not present

## 2020-02-17 DIAGNOSIS — I69391 Dysphagia following cerebral infarction: Secondary | ICD-10-CM | POA: Diagnosis not present

## 2020-02-17 DIAGNOSIS — E119 Type 2 diabetes mellitus without complications: Secondary | ICD-10-CM | POA: Diagnosis not present

## 2020-02-17 DIAGNOSIS — F329 Major depressive disorder, single episode, unspecified: Secondary | ICD-10-CM | POA: Diagnosis not present

## 2020-02-19 ENCOUNTER — Other Ambulatory Visit: Payer: Self-pay

## 2020-02-19 ENCOUNTER — Encounter: Payer: Self-pay | Admitting: Physical Medicine & Rehabilitation

## 2020-02-19 ENCOUNTER — Encounter: Payer: Medicare Other | Attending: Physical Medicine & Rehabilitation | Admitting: Physical Medicine & Rehabilitation

## 2020-02-19 VITALS — BP 124/68 | Ht 71.0 in | Wt 190.0 lb

## 2020-02-19 DIAGNOSIS — G8111 Spastic hemiplegia affecting right dominant side: Secondary | ICD-10-CM | POA: Diagnosis not present

## 2020-02-19 DIAGNOSIS — I611 Nontraumatic intracerebral hemorrhage in hemisphere, cortical: Secondary | ICD-10-CM | POA: Diagnosis not present

## 2020-02-19 NOTE — Progress Notes (Signed)
Subjective:    Patient ID: David [name], male    DOB: 1924-04-11, 86 y.o.   MRN: [id number]  HPI  The patient has consented to a tele-health/telephone encounter with [facility]. The patient is at home and the provider is at the office. Two separate patient identifiers were utilized to confirm the identity of the patient.   HHPT/OT is working on strengthening exercises, sliding board transfers from w/c and shower, standing from w/c, short dx ambulating using counter for support.   He had nice results with botox injections with relxation of his finger flexors as well as his right heel cords. He is also using his hand more.   His right shoulder is painful which is often aggravated by activity, ROM in therapy.. PT is addressing ROM exercise.   His mood has been generally positive. Daughter say he likes therapy and works hard every day.   Bowel and bladder function have been farily regular. It is difficult some times to get to bahtroom in time d/t the time it takes to mobilize there.   Pain Inventory Average Pain 5 Pain Right Now 1 My pain is intermittent, sharp and aching  In the last 24 hours, has pain interfered with the following? General activity 0 Relation with others 0 Enjoyment of life 0 What TIME of day is your pain at its worst? morning , daytime, evening and night Sleep (in general) Good  Pain is worse with: some activites and movement during therapy. Pain improves with: medication Relief from Meds: Tylenol helps.  Family History  Problem Relation Age of Onset  . Coronary artery disease Other   . Hypertension Other    Social History   Socioeconomic History  . Marital status: Widowed    Spouse name: Not on file  . Number of children: Not on file  . Years of education: Not on file  . Highest education level: Not on file  Occupational History  . Not on file  Tobacco Use  . Smoking status: Former Smoker    Types: Pipe     Quit date: 05/16/1998    Years since quitting: 21.7  . Smokeless tobacco: Former Network engineer  . Vaping Use: Never used  Substance and Sexual Activity  . Alcohol use: No  . Drug use: No  . Sexual activity: Never  Other Topics Concern  . Not on file  Social History Narrative  . Not on file   Social Determinants of Health   Financial Resource Strain:   . Difficulty of Paying Living Expenses: Not on file  Food Insecurity:   . Worried About Charity fundraiser in the Last Year: Not on file  . Ran Out of Food in the Last Year: Not on file  Transportation Needs:   . Lack of Transportation (Medical): Not on file  . Lack of Transportation (Non-Medical): Not on file  Physical Activity:   . Days of Exercise per Week: Not on file  . Minutes of Exercise per Session: Not on file  Stress:   . Feeling of Stress : Not on file  Social Connections:   . Frequency of Communication with Friends and Family: Not on file  . Frequency of Social Gatherings with Friends and Family: Not on file  . Attends Religious Services: Not on file  . Active Member of Clubs or Organizations: Not on file  . Attends Archivist Meetings: Not on file  . Marital Status: Not on file  Past Surgical History:  Procedure Laterality Date  . 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 CARDIAC CATHETERIZATION  10/05/1998   Recommend CABG revascularizaition  . CARDIAC CATHETERIZATION  06/11/1999   Continue medical therapy  . CARDIAC CATHETERIZATION  01/08/2007   Patent grafts, recommend medical therapy  . CARDIOVASCULAR STRESS TEST  04/22/2010   Small lateral defect which is partially reversible, no ischemic EKG changes were noted.  Marland Kitchen CAROTID DOPPLER  04/22/2010   Bilateral ICAs-no evidence of diametere reduction, significant tortuosity, or any other vascular abnormality.  . CORONARY ARTERY BYPASS GRAFT  10/09/1998   LIMA to LAD, SVG to diag, SVG sequential to 2 marginals, SVG sequential to PDA & PLA. CE#25  porcine AVR.  Marland Kitchen TRANSTHORACIC ECHOCARDIOGRAM  05/14/2012   EF 50-55%, bioprosthesis of the aortic valve with well preserved LV function, moderate concentric [place]   Past Surgical History:  Procedure Laterality Date  . 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 CARDIAC CATHETERIZATION  10/05/1998   Recommend CABG revascularizaition  . CARDIAC CATHETERIZATION  06/11/1999   Continue medical therapy  . CARDIAC CATHETERIZATION  01/08/2007   Patent grafts, recommend medical therapy  . CARDIOVASCULAR STRESS TEST  04/22/2010   Small lateral defect which is partially reversible, no ischemic EKG changes were noted.  Marland Kitchen CAROTID DOPPLER  04/22/2010   Bilateral ICAs-no evidence of diametere reduction, significant tortuosity, or any other vascular abnormality.  . CORONARY ARTERY BYPASS GRAFT  10/09/1998   LIMA to LAD, SVG to diag, SVG sequential to 2 marginals, SVG sequential to PDA & PLA. CE#25 porcine AVR.  Marland Kitchen TRANSTHORACIC ECHOCARDIOGRAM  05/14/2012   EF 50-55%, bioprosthesis of the aortic valve with well preserved LV function, moderate concentric [place]   Past Medical History:  Diagnosis Date  . CAD (coronary artery disease)    intolerance of statins  . COPD (chronic obstructive pulmonary disease) ([place])   . Current use of long term anticoagulation   . GERD (gastroesophageal reflux disease)   . Hyperlipidemia   . Hypertension   . Impaired glucose tolerance 02/14/2011  . LBP (low back pain)   . OA (osteoarthritis)   . 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   BP 124/68   Ht 5\' 11"  (1.803 m)   Wt 190 lb (86.2 kg) Comment: phone visit today - last wgt on 01/29/20  BMI 26.50 kg/m   Opioid Risk Score:   Fall Risk Score:  `1  Depression screen PHQ 2/9  Depression screen [facility] 2/9 10/30/2019 05/20/2019 12/04/2017 12/02/2016 12/02/2015 07/17/2014  Decreased Interest 0 0 0 0 0 0  Down, Depressed, Hopeless 1 0 0 0 0 0  PHQ - 2 Score 1 0 0 0 0 0  Altered sleeping 1 - - - - -  Tired,  decreased energy 0 - - - - -  Change in appetite 1 - - - - -  Feeling bad or failure about yourself  1 - - - - -  Trouble concentrating 3 - - - - -  Moving slowly or fidgety/restless 1 - - - - -  Suicidal thoughts 0 - - - - -  PHQ-9 Score 8 - - - - -  Difficult doing work/chores Not difficult at all - - - - -   Review of Systems  Constitutional: Negative.   HENT: Negative.   Eyes: Negative.   Respiratory: Negative.   Cardiovascular: Negative.   Gastrointestinal: Positive for constipation.  Endocrine: Negative.  Musculoskeletal: Positive for gait problem.       Right-side body  pain Left heel pain  Skin: Positive for wound.       Left heel - redness  Allergic/Immunologic: Negative.   Hematological: Negative.   Psychiatric/Behavioral: Negative.        Objective:   Physical Exam   n/a     Assessment & Plan:  1.  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   -he is working hard, family is supportive. Continue to push forward 2. Chronic LBP/Pain Management:  Tylenol prn. well controlled 3. Mood:               -lexapro--5mg  effective  -still some episodic depression 4.  Spastic right hemiparesis: good results with botox in the RUE/RLE in August with reduced spasms and increased volitional movement.  5. Arousal:             -continue amantadine 100mg  daily             -ritalin   5mg  bid--- we will go ahead and stop Ritalin today as his arousal is improved.                       6. Skin/Wound Care:  Continue local measures.  7. RIght shoulder pain. ?adhesive capsulitis  -keep arm supported, elevated  -shoulder ROM with family, PT  -consider steroid injection 8. HTN:  Blood pressure controlled     9. H/o COPD: Stable without meds.   10. CAD/AVR: Off ASA due to bleed. No statin due to intolerance.              -no TEE was indicated  12. Constipation:               -Continue senokot-s at bedtime              `-  MiraLAX daily   11 minutes of tele-visit time was spent with this patient today. Follow up in 2  months

## 2020-02-20 DIAGNOSIS — I10 Essential (primary) hypertension: Secondary | ICD-10-CM | POA: Diagnosis not present

## 2020-02-20 DIAGNOSIS — I69391 Dysphagia following cerebral infarction: Secondary | ICD-10-CM | POA: Diagnosis not present

## 2020-02-20 DIAGNOSIS — E119 Type 2 diabetes mellitus without complications: Secondary | ICD-10-CM | POA: Diagnosis not present

## 2020-02-20 DIAGNOSIS — I69351 Hemiplegia and hemiparesis following cerebral infarction affecting right dominant side: Secondary | ICD-10-CM | POA: Diagnosis not present

## 2020-02-20 DIAGNOSIS — F329 Major depressive disorder, single episode, unspecified: Secondary | ICD-10-CM | POA: Diagnosis not present

## 2020-02-20 DIAGNOSIS — R1310 Dysphagia, unspecified: Secondary | ICD-10-CM | POA: Diagnosis not present

## 2020-02-21 ENCOUNTER — Telehealth: Payer: Self-pay | Admitting: Internal Medicine

## 2020-02-21 DIAGNOSIS — R1310 Dysphagia, unspecified: Secondary | ICD-10-CM | POA: Diagnosis not present

## 2020-02-21 DIAGNOSIS — E119 Type 2 diabetes mellitus without complications: Secondary | ICD-10-CM | POA: Diagnosis not present

## 2020-02-21 DIAGNOSIS — I10 Essential (primary) hypertension: Secondary | ICD-10-CM | POA: Diagnosis not present

## 2020-02-21 DIAGNOSIS — I69391 Dysphagia following cerebral infarction: Secondary | ICD-10-CM | POA: Diagnosis not present

## 2020-02-21 DIAGNOSIS — F329 Major depressive disorder, single episode, unspecified: Secondary | ICD-10-CM | POA: Diagnosis not present

## 2020-02-21 DIAGNOSIS — I69351 Hemiplegia and hemiparesis following cerebral infarction affecting right dominant side: Secondary | ICD-10-CM | POA: Diagnosis not present

## 2020-02-21 NOTE — Telephone Encounter (Signed)
Thayer Headings has asked for a letter requesting the [employer] from [facility]   Please fax letter to:  Fax: [phone]

## 2020-02-21 NOTE — Telephone Encounter (Signed)
   [facility] requesting order to extend home PT, 2x week for 4  Call [phone], ok to leave message

## 2020-02-24 DIAGNOSIS — R1310 Dysphagia, unspecified: Secondary | ICD-10-CM | POA: Diagnosis not present

## 2020-02-24 DIAGNOSIS — I10 Essential (primary) hypertension: Secondary | ICD-10-CM | POA: Diagnosis not present

## 2020-02-24 DIAGNOSIS — I69391 Dysphagia following cerebral infarction: Secondary | ICD-10-CM | POA: Diagnosis not present

## 2020-02-24 DIAGNOSIS — F329 Major depressive disorder, single episode, unspecified: Secondary | ICD-10-CM | POA: Diagnosis not present

## 2020-02-24 DIAGNOSIS — I69351 Hemiplegia and hemiparesis following cerebral infarction affecting right dominant side: Secondary | ICD-10-CM | POA: Diagnosis not present

## 2020-02-24 DIAGNOSIS — E119 Type 2 diabetes mellitus without complications: Secondary | ICD-10-CM | POA: Diagnosis not present

## 2020-02-25 DIAGNOSIS — I69391 Dysphagia following cerebral infarction: Secondary | ICD-10-CM | POA: Diagnosis not present

## 2020-02-25 DIAGNOSIS — R1310 Dysphagia, unspecified: Secondary | ICD-10-CM | POA: Diagnosis not present

## 2020-02-25 DIAGNOSIS — I10 Essential (primary) hypertension: Secondary | ICD-10-CM | POA: Diagnosis not present

## 2020-02-25 DIAGNOSIS — E119 Type 2 diabetes mellitus without complications: Secondary | ICD-10-CM | POA: Diagnosis not present

## 2020-02-25 DIAGNOSIS — I69351 Hemiplegia and hemiparesis following cerebral infarction affecting right dominant side: Secondary | ICD-10-CM | POA: Diagnosis not present

## 2020-02-25 DIAGNOSIS — F329 Major depressive disorder, single episode, unspecified: Secondary | ICD-10-CM | POA: Diagnosis not present

## 2020-02-25 NOTE — Telephone Encounter (Signed)
Called nurse back w/[facility]. Gave verbal to extend PT, 2x week for 4 weeks. Pt just seen MD on 01/29/20...David [name]

## 2020-02-25 NOTE — Telephone Encounter (Signed)
Letter must come from PCP which is out of the office until 10/22. Will hold until he returns.Marland KitchenJohny Chess

## 2020-02-26 DIAGNOSIS — F0789 Other personality and behavioral disorders due to known physiological condition: Secondary | ICD-10-CM | POA: Diagnosis not present

## 2020-02-26 DIAGNOSIS — F32A Depression, unspecified: Secondary | ICD-10-CM | POA: Diagnosis not present

## 2020-02-26 DIAGNOSIS — I69391 Dysphagia following cerebral infarction: Secondary | ICD-10-CM | POA: Diagnosis not present

## 2020-02-26 DIAGNOSIS — Z9181 History of falling: Secondary | ICD-10-CM | POA: Diagnosis not present

## 2020-02-26 DIAGNOSIS — E785 Hyperlipidemia, unspecified: Secondary | ICD-10-CM | POA: Diagnosis not present

## 2020-02-26 DIAGNOSIS — Z87891 Personal history of nicotine dependence: Secondary | ICD-10-CM | POA: Diagnosis not present

## 2020-02-26 DIAGNOSIS — Z952 Presence of prosthetic heart valve: Secondary | ICD-10-CM | POA: Diagnosis not present

## 2020-02-26 DIAGNOSIS — I2510 Atherosclerotic heart disease of native coronary artery without angina pectoris: Secondary | ICD-10-CM

## 2020-02-26 DIAGNOSIS — J449 Chronic obstructive pulmonary disease, unspecified: Secondary | ICD-10-CM

## 2020-02-26 DIAGNOSIS — M545 Low back pain, unspecified: Secondary | ICD-10-CM | POA: Diagnosis not present

## 2020-02-26 DIAGNOSIS — R1310 Dysphagia, unspecified: Secondary | ICD-10-CM | POA: Diagnosis not present

## 2020-02-26 DIAGNOSIS — D485 Neoplasm of uncertain behavior of skin: Secondary | ICD-10-CM

## 2020-02-26 DIAGNOSIS — I10 Essential (primary) hypertension: Secondary | ICD-10-CM | POA: Diagnosis not present

## 2020-02-26 DIAGNOSIS — E119 Type 2 diabetes mellitus without complications: Secondary | ICD-10-CM | POA: Diagnosis not present

## 2020-02-26 DIAGNOSIS — H8110 Benign paroxysmal vertigo, unspecified ear: Secondary | ICD-10-CM | POA: Diagnosis not present

## 2020-02-26 DIAGNOSIS — I69328 Other speech and language deficits following cerebral infarction: Secondary | ICD-10-CM | POA: Diagnosis not present

## 2020-02-26 DIAGNOSIS — M1990 Unspecified osteoarthritis, unspecified site: Secondary | ICD-10-CM | POA: Diagnosis not present

## 2020-02-26 DIAGNOSIS — Z993 Dependence on wheelchair: Secondary | ICD-10-CM | POA: Diagnosis not present

## 2020-02-26 DIAGNOSIS — F329 Major depressive disorder, single episode, unspecified: Secondary | ICD-10-CM | POA: Diagnosis not present

## 2020-02-26 DIAGNOSIS — D696 Thrombocytopenia, unspecified: Secondary | ICD-10-CM | POA: Diagnosis not present

## 2020-02-26 DIAGNOSIS — K219 Gastro-esophageal reflux disease without esophagitis: Secondary | ICD-10-CM | POA: Diagnosis not present

## 2020-02-26 DIAGNOSIS — I69351 Hemiplegia and hemiparesis following cerebral infarction affecting right dominant side: Secondary | ICD-10-CM | POA: Diagnosis not present

## 2020-02-28 ENCOUNTER — Telehealth: Payer: Self-pay | Admitting: Internal Medicine

## 2020-02-28 NOTE — Telephone Encounter (Signed)
Called David [name] there was no answer LMOM ok verbal for PT. Pt has been seen in the last 30 days w/PCP.Marland KitchenJohny Chess

## 2020-02-28 NOTE — Telephone Encounter (Signed)
    David [name] from [facility] requesting order for speech evaluation effective 10/18. 1x week for 3   Phone [phone], ok to leave a message

## 2020-03-03 DIAGNOSIS — E119 Type 2 diabetes mellitus without complications: Secondary | ICD-10-CM | POA: Diagnosis not present

## 2020-03-03 DIAGNOSIS — I69328 Other speech and language deficits following cerebral infarction: Secondary | ICD-10-CM | POA: Diagnosis not present

## 2020-03-03 DIAGNOSIS — I69391 Dysphagia following cerebral infarction: Secondary | ICD-10-CM | POA: Diagnosis not present

## 2020-03-03 DIAGNOSIS — I69351 Hemiplegia and hemiparesis following cerebral infarction affecting right dominant side: Secondary | ICD-10-CM | POA: Diagnosis not present

## 2020-03-03 DIAGNOSIS — R1310 Dysphagia, unspecified: Secondary | ICD-10-CM | POA: Diagnosis not present

## 2020-03-03 DIAGNOSIS — I10 Essential (primary) hypertension: Secondary | ICD-10-CM | POA: Diagnosis not present

## 2020-03-03 NOTE — Telephone Encounter (Signed)
Ok Lucy, Pls write a letter Dx CVA, skin pressure sores Thx

## 2020-03-04 ENCOUNTER — Telehealth: Payer: Self-pay | Admitting: Internal Medicine

## 2020-03-04 ENCOUNTER — Encounter: Payer: Self-pay | Admitting: Adult Health

## 2020-03-04 ENCOUNTER — Telehealth (INDEPENDENT_AMBULATORY_CARE_PROVIDER_SITE_OTHER): Payer: Medicare Other | Admitting: Adult Health

## 2020-03-04 DIAGNOSIS — H9193 Unspecified hearing loss, bilateral: Secondary | ICD-10-CM | POA: Diagnosis not present

## 2020-03-04 DIAGNOSIS — E785 Hyperlipidemia, unspecified: Secondary | ICD-10-CM

## 2020-03-04 DIAGNOSIS — I10 Essential (primary) hypertension: Secondary | ICD-10-CM | POA: Diagnosis not present

## 2020-03-04 DIAGNOSIS — I69351 Hemiplegia and hemiparesis following cerebral infarction affecting right dominant side: Secondary | ICD-10-CM

## 2020-03-04 DIAGNOSIS — I69320 Aphasia following cerebral infarction: Secondary | ICD-10-CM | POA: Diagnosis not present

## 2020-03-04 DIAGNOSIS — E119 Type 2 diabetes mellitus without complications: Secondary | ICD-10-CM | POA: Diagnosis not present

## 2020-03-04 DIAGNOSIS — R1310 Dysphagia, unspecified: Secondary | ICD-10-CM | POA: Diagnosis not present

## 2020-03-04 DIAGNOSIS — I610 Nontraumatic intracerebral hemorrhage in hemisphere, subcortical: Secondary | ICD-10-CM | POA: Diagnosis not present

## 2020-03-04 DIAGNOSIS — I69391 Dysphagia following cerebral infarction: Secondary | ICD-10-CM | POA: Diagnosis not present

## 2020-03-04 DIAGNOSIS — I69328 Other speech and language deficits following cerebral infarction: Secondary | ICD-10-CM | POA: Diagnosis not present

## 2020-03-04 NOTE — Progress Notes (Signed)
[facility] [address] [place]. [place] [address] [phone]       STROKE FOLLOW UP NOTE  [name] Date of Birth:  July 16, 1923 Medical Record Number:  [id number]   Reason for Referral: stroke follow up  Virtual Visit via Video Note  I connected with David [name] on 03/04/20 at  2:45 PM EDT by a video enabled telemedicine application located at [facility] in office and verified that I am speaking with the correct person using two identifiers who was located at their own home accompanied by his daughter.   I discussed the limitations of evaluation and management by telemedicine and the availability of in person appointments. The patient expressed understanding and agreed to proceed.   SUBJECTIVE:  HPI:   Today, 03/04/2020, David [name] is being seen via virtual visit accompanied by his daughter for stroke follow-up per family request. He has returned home from SNF on 8/6 and currently working with [facility] PT/OT/SLP. Residual right spastic hemiparesis, imbalance and aphasia has been improving per daughter. She does question possible some receptive aphasia vs hearing impairment and questions possible referral for hearing tests. Able to ambulate short distance with RW.  Denies new or worsening stroke/TIA symptoms.  Remains on Zetia 10 mg daily.  Blood pressure monitored at home with recent pressure by therapy and has been running on the lower side. Recent adjustment to atenolol by PCP.  No further concerns at this time.     History provided for reference purposes only Update 11/11/2019 JM: David [name] is being seen for hospital follow-up accompanied by his son.  He continues to reside at [facility] where he continues to receive ongoing therapy.  Residual deficits of right spastic hemiplegia, cognitive impairment, dysphagia and hypophonia with expressive> receptive aphasia.  Son does report improvement with long-term goal of returning home  depending on recovery.  Continues on modified diet per son.  Per review of MAR provided by facility continues on Zetia 10 mg daily for HLD management.  Blood pressure today 119/66.  No further concerns at this time.  Stroke admission 09/04/2019 David [name] is a 86 y.o. male with history of s/p AVR, impaired glucose tolerance, hypertension, hyperlipidemia, COPD and CAD who presented on 09/04/2019 with dysarthria, aphaisa, R sided weakness, R facial droop, L gaze preference.  Stroke work-up revealed left BG ICH secondary to small vessel disease versus thrombocytopenia.  2D echo showed pulmonic valve vegetation vs mass asymptomatic and likely chronic and advised ongoing follow-up with established cardiologist Dr. Claiborne Billings.  History of HTN with long-term BP goal normotensive range.  LDL 138 with history of statin intolerance and resume Zetia 10 mg daily.  Stable thrombocytopenia and advised outpatient follow-up with PCP for monitoring management.  Other stroke risk factors include advanced age, tobacco use, former smokeless tobacco use, obesity, CAD and s/p AV replacement.  Other active problems include COPD and CKD.  Residual deficits of severe dysarthria vs aphasia, dysphagia, right hemiplegia, lefthemiparesis and cognitive impairment.  Evaluated by therapies who recommended discharge to CIR on 09/09/2019 for ongoing therapy needs.  Per review of CIR discharge note, on 09/10/2019, patient noted to be more lethargic, low-grade fever and abdominal distention with CT head revealing slightly more surrounding edema as expected as well as slight enlargement of left basal ganglia hemorrhage with small amount of blood in occipital horn of bilateral ventricles.  Also found for worsening renal status and questionable atelectasis versus pneumonia treated with IV Zosyn.  Lexapro  initiated for mood stabilization as well as Ritalin and amantadine for activation and attention.  Due to slow progress reporting extensive amount of  assistance, he was discharged to [facility] on 10/07/2019.  Stroke:   L BG ICH - small vessel disease vs. thrombocytopenia   Code Stroke CT head L lentiform nucleus ICH. Small vessel disease.   CT CS no traumatic injury. Spine degeneration  CTA head & neck L basal ganglia ICH. No LVO. Min atherosclerosis. Prior CABG. Aortic atherosclerosis.   CT head unchanged L basal ganglia ICH. Mild small vessel disease.  MRI  Stable L basal ganglia ICH. No underlying lesion. Old B basal ganglia infarcts. Small vessel disease.  2D Echo EF 60-65%, but pulmonic valve vegetation found  LDL 138  HgbA1c 5.9  SCDs for VTE prophylaxis  aspirin 81 mg daily prior to admission, now on No antithrombotic given hemorrhage and thrombocytopenia   Therapy recommendations:  CIR  Disposition:  CIR       ROS:   14 system review of systems performed and negative with exception of those listed in HPI   PMH:  Past Medical History:  Diagnosis Date  . CAD (coronary artery disease)    intolerance of statins  . COPD (chronic obstructive pulmonary disease) ([place])   . Current use of long term anticoagulation   . GERD (gastroesophageal reflux disease)   . Hyperlipidemia   . Hypertension   . Impaired glucose tolerance 02/14/2011  . LBP (low back pain)   . OA (osteoarthritis)   . 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    PSH:  Past Surgical History:  Procedure Laterality Date  . 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 CARDIAC CATHETERIZATION  10/05/1998   Recommend CABG revascularizaition  . CARDIAC CATHETERIZATION  06/11/1999   Continue medical therapy  . CARDIAC CATHETERIZATION  01/08/2007   Patent grafts, recommend medical therapy  . CARDIOVASCULAR STRESS TEST  04/22/2010   Small lateral defect which is partially reversible, no ischemic EKG changes were noted.  Marland Kitchen CAROTID DOPPLER  04/22/2010   Bilateral ICAs-no evidence of diametere reduction, significant tortuosity, or  any other vascular abnormality.  . CORONARY ARTERY BYPASS GRAFT  10/09/1998   LIMA to LAD, SVG to diag, SVG sequential to 2 marginals, SVG sequential to PDA & PLA. CE#25 porcine AVR.  Marland Kitchen TRANSTHORACIC ECHOCARDIOGRAM  05/14/2012   EF 50-55%, bioprosthesis of the aortic valve with well preserved LV function, moderate concentric [place]    Social History:  Social History   Socioeconomic History  . Marital status: Widowed    Spouse name: Not on file  . Number of children: Not on file  . Years of education: Not on file  . Highest education level: Not on file  Occupational History  . Not on file  Tobacco Use  . Smoking status: Former Smoker    Types: Pipe    Quit date: 05/16/1998    Years since quitting: 21.8  . Smokeless tobacco: Former Network engineer  . Vaping Use: Never used  Substance and Sexual Activity  . Alcohol use: No  . Drug use: No  . Sexual activity: Never  Other Topics Concern  . Not on file  Social History Narrative  . Not on file   Social Determinants of Health   Financial Resource Strain:   . Difficulty of Paying Living Expenses: Not on file  Food Insecurity:   . Worried About Charity fundraiser in  the Last Year: Not on file  . Ran Out of Food in the Last Year: Not on file  Transportation Needs:   . Lack of Transportation (Medical): Not on file  . Lack of Transportation (Non-Medical): Not on file  Physical Activity:   . Days of Exercise per Week: Not on file  . Minutes of Exercise per Session: Not on file  Stress:   . Feeling of Stress : Not on file  Social Connections:   . Frequency of Communication with Friends and Family: Not on file  . Frequency of Social Gatherings with Friends and Family: Not on file  . Attends Religious Services: Not on file  . Active Member of Clubs or Organizations: Not on file  . Attends Archivist Meetings: Not on file  . Marital Status: Not on file  Intimate Partner Violence:   . Fear of Current or Ex-Partner: Not  on file  . Emotionally Abused: Not on file  . Physically Abused: Not on file  . Sexually Abused: Not on file    Family History:  Family History  Problem Relation Age of Onset  . Coronary artery disease Other   . Hypertension Other     Medications:   Current Outpatient Medications on File Prior to Visit  Medication Sig Dispense Refill  . atenolol (TENORMIN) 25 MG tablet Take 1 tablet (25 mg total) by mouth daily. 30 tablet 0  . acetaminophen (TYLENOL) 325 MG tablet Take 1-2 tablets (325-650 mg total) by mouth every 4 (four) hours as needed for mild pain.    Marland Kitchen amantadine (SYMMETREL) 50 MG/5ML solution Take 10 mLs (100 mg total) by mouth 2 (two) times daily. 473 mL 5  . escitalopram (LEXAPRO) 5 MG tablet TAKE 1 TABLET BY MOUTH EVERYDAY AT BEDTIME 90 tablet 3  . ezetimibe (ZETIA) 10 MG tablet Take 1 tablet (10 mg total) by mouth daily. 30 tablet 0  . lactulose (CHRONULAC) 10 GM/15ML solution 30-60 ml bid po prn 1000 mL 5  . pantoprazole (PROTONIX) 40 MG tablet Take 1 tablet (40 mg total) by mouth daily. 90 tablet 3  . polyethylene glycol (MIRALAX / GLYCOLAX) 17 g packet Take 17 g by mouth daily.    Marland Kitchen senna-docusate (SENOKOT-S) 8.6-50 MG tablet Take 2 tablets by mouth 2 (two) times daily. 60 tablet 0   No current facility-administered medications on file prior to visit.    Allergies:   Allergies  Allergen Reactions  . Atorvastatin Other (See Comments)    Unknown reaction  . Colesevelam Other (See Comments)    Unknown reaction  . Simvastatin Other (See Comments)    Unknown reaction  . Sulfa Antibiotics Other (See Comments)    Unknown reaction      OBJECTIVE:  N/a d/t visit type      ASSESSMENT/PLAN: David [name] is a 86 y.o. year old male presented with dysarthria, aphasia, right-sided weakness, right facial droop and left gaze preference on 09/04/2019 with stroke work-up revealing left BG ICH secondary to small vessel disease versus thrombocytopenia. Vascular risk  factors include chronic pulmonic valve vegetation vs mass, HTN, HLD, thrombocytopenia, former tobacco use, CAD and s/p AV replacement.    1. L BG ICH :  a. Residual deficits: Aphasia, dysarthria, imbalance and right hemiparesis.  Questions possible receptive aphasia vs hearing impairment -referral placed to audiology for further evaluation as hearing loss could interfere with further recovery.  Continue to work with home health therapy and possibly transition to outpatient therapy once completed  b. Avoid aspirin, aspirin-containing products and ibuprofen products due to recent ICH c. Continue Zetia 10 mg daily for secondary stroke prevention.  d. Discussed secondary stroke prevention measures and importance of continue to follow with PCP for HTN and HLD management and discussed importance of managing stroke risk factors 2. HTN:  a. BP goal <130/90 b. Managed by PCP 3. HLD:  a. LDL goal <70 b. Continue Zetia 10 mg daily c. History of statin intolerance   Follow up in 6 months or call earlier if needed   I spent 25 minutes of non-face-to-face time with patient and and daughter via virtual visit.  This included previsit chart review, lab review, study review, order entry, electronic health record documentation, patient education regarding recent stroke, residual deficits, importance of managing stroke risk factors and answered all questions to patient satisfaction    Frann Rider, [place]  [facility] [address] [place], [place] [address]  Phone [phone] Fax [phone] Note: This document was prepared with digital dictation and possible smart phrase technology. Any transcriptional errors that result from this process are unintentional.

## 2020-03-04 NOTE — Telephone Encounter (Signed)
    [facility] calling to request new order be written for amantadine (SYMMETREL) 50 MG/5ML. Instead of liquid they are requesting pill per the daughters request [facility] [place], [address] [place] [address]

## 2020-03-04 NOTE — Telephone Encounter (Signed)
Generated letter. MD sign faxed to [facility].Marland KitchenJohny Chess

## 2020-03-05 DIAGNOSIS — E119 Type 2 diabetes mellitus without complications: Secondary | ICD-10-CM | POA: Diagnosis not present

## 2020-03-05 DIAGNOSIS — R1310 Dysphagia, unspecified: Secondary | ICD-10-CM | POA: Diagnosis not present

## 2020-03-05 DIAGNOSIS — I10 Essential (primary) hypertension: Secondary | ICD-10-CM | POA: Diagnosis not present

## 2020-03-05 DIAGNOSIS — I69391 Dysphagia following cerebral infarction: Secondary | ICD-10-CM | POA: Diagnosis not present

## 2020-03-05 DIAGNOSIS — I69351 Hemiplegia and hemiparesis following cerebral infarction affecting right dominant side: Secondary | ICD-10-CM | POA: Diagnosis not present

## 2020-03-05 DIAGNOSIS — I69328 Other speech and language deficits following cerebral infarction: Secondary | ICD-10-CM | POA: Diagnosis not present

## 2020-03-06 MED ORDER — AMANTADINE HCL 100 MG PO CAPS: 100.0000 mg | ORAL_CAPSULE | Freq: Two times a day (BID) | ORAL | 3 refills | Status: DC

## 2020-03-06 NOTE — Progress Notes (Signed)
I agree with the above plan 

## 2020-03-06 NOTE — Telephone Encounter (Signed)
Done. Thx.

## 2020-03-07 DIAGNOSIS — E119 Type 2 diabetes mellitus without complications: Secondary | ICD-10-CM | POA: Diagnosis not present

## 2020-03-07 DIAGNOSIS — I69328 Other speech and language deficits following cerebral infarction: Secondary | ICD-10-CM | POA: Diagnosis not present

## 2020-03-07 DIAGNOSIS — I69351 Hemiplegia and hemiparesis following cerebral infarction affecting right dominant side: Secondary | ICD-10-CM | POA: Diagnosis not present

## 2020-03-07 DIAGNOSIS — I10 Essential (primary) hypertension: Secondary | ICD-10-CM | POA: Diagnosis not present

## 2020-03-07 DIAGNOSIS — R1310 Dysphagia, unspecified: Secondary | ICD-10-CM | POA: Diagnosis not present

## 2020-03-07 DIAGNOSIS — I69391 Dysphagia following cerebral infarction: Secondary | ICD-10-CM | POA: Diagnosis not present

## 2020-03-09 DIAGNOSIS — R1310 Dysphagia, unspecified: Secondary | ICD-10-CM | POA: Diagnosis not present

## 2020-03-09 DIAGNOSIS — I69351 Hemiplegia and hemiparesis following cerebral infarction affecting right dominant side: Secondary | ICD-10-CM | POA: Diagnosis not present

## 2020-03-09 DIAGNOSIS — I10 Essential (primary) hypertension: Secondary | ICD-10-CM | POA: Diagnosis not present

## 2020-03-09 DIAGNOSIS — E119 Type 2 diabetes mellitus without complications: Secondary | ICD-10-CM | POA: Diagnosis not present

## 2020-03-09 DIAGNOSIS — I69328 Other speech and language deficits following cerebral infarction: Secondary | ICD-10-CM | POA: Diagnosis not present

## 2020-03-09 DIAGNOSIS — I69391 Dysphagia following cerebral infarction: Secondary | ICD-10-CM | POA: Diagnosis not present

## 2020-03-10 DIAGNOSIS — I10 Essential (primary) hypertension: Secondary | ICD-10-CM | POA: Diagnosis not present

## 2020-03-10 DIAGNOSIS — I69391 Dysphagia following cerebral infarction: Secondary | ICD-10-CM | POA: Diagnosis not present

## 2020-03-10 DIAGNOSIS — E119 Type 2 diabetes mellitus without complications: Secondary | ICD-10-CM | POA: Diagnosis not present

## 2020-03-10 DIAGNOSIS — R1310 Dysphagia, unspecified: Secondary | ICD-10-CM | POA: Diagnosis not present

## 2020-03-10 DIAGNOSIS — I69351 Hemiplegia and hemiparesis following cerebral infarction affecting right dominant side: Secondary | ICD-10-CM | POA: Diagnosis not present

## 2020-03-10 DIAGNOSIS — I69328 Other speech and language deficits following cerebral infarction: Secondary | ICD-10-CM | POA: Diagnosis not present

## 2020-03-11 DIAGNOSIS — R1310 Dysphagia, unspecified: Secondary | ICD-10-CM | POA: Diagnosis not present

## 2020-03-11 DIAGNOSIS — I69351 Hemiplegia and hemiparesis following cerebral infarction affecting right dominant side: Secondary | ICD-10-CM | POA: Diagnosis not present

## 2020-03-11 DIAGNOSIS — E119 Type 2 diabetes mellitus without complications: Secondary | ICD-10-CM | POA: Diagnosis not present

## 2020-03-11 DIAGNOSIS — I69391 Dysphagia following cerebral infarction: Secondary | ICD-10-CM | POA: Diagnosis not present

## 2020-03-11 DIAGNOSIS — I10 Essential (primary) hypertension: Secondary | ICD-10-CM | POA: Diagnosis not present

## 2020-03-11 DIAGNOSIS — I69328 Other speech and language deficits following cerebral infarction: Secondary | ICD-10-CM | POA: Diagnosis not present

## 2020-03-12 ENCOUNTER — Encounter: Payer: Self-pay | Admitting: Internal Medicine

## 2020-03-12 DIAGNOSIS — I69328 Other speech and language deficits following cerebral infarction: Secondary | ICD-10-CM | POA: Diagnosis not present

## 2020-03-12 DIAGNOSIS — R1310 Dysphagia, unspecified: Secondary | ICD-10-CM | POA: Diagnosis not present

## 2020-03-12 DIAGNOSIS — I10 Essential (primary) hypertension: Secondary | ICD-10-CM | POA: Diagnosis not present

## 2020-03-12 DIAGNOSIS — I69391 Dysphagia following cerebral infarction: Secondary | ICD-10-CM | POA: Diagnosis not present

## 2020-03-12 DIAGNOSIS — E119 Type 2 diabetes mellitus without complications: Secondary | ICD-10-CM | POA: Diagnosis not present

## 2020-03-12 DIAGNOSIS — I69351 Hemiplegia and hemiparesis following cerebral infarction affecting right dominant side: Secondary | ICD-10-CM | POA: Diagnosis not present

## 2020-03-13 NOTE — Telephone Encounter (Signed)
error 

## 2020-03-16 DIAGNOSIS — I69391 Dysphagia following cerebral infarction: Secondary | ICD-10-CM | POA: Diagnosis not present

## 2020-03-16 DIAGNOSIS — R1310 Dysphagia, unspecified: Secondary | ICD-10-CM | POA: Diagnosis not present

## 2020-03-16 DIAGNOSIS — I69351 Hemiplegia and hemiparesis following cerebral infarction affecting right dominant side: Secondary | ICD-10-CM | POA: Diagnosis not present

## 2020-03-16 DIAGNOSIS — E119 Type 2 diabetes mellitus without complications: Secondary | ICD-10-CM | POA: Diagnosis not present

## 2020-03-16 DIAGNOSIS — I69328 Other speech and language deficits following cerebral infarction: Secondary | ICD-10-CM | POA: Diagnosis not present

## 2020-03-16 DIAGNOSIS — I10 Essential (primary) hypertension: Secondary | ICD-10-CM | POA: Diagnosis not present

## 2020-03-18 DIAGNOSIS — R1310 Dysphagia, unspecified: Secondary | ICD-10-CM | POA: Diagnosis not present

## 2020-03-18 DIAGNOSIS — I69328 Other speech and language deficits following cerebral infarction: Secondary | ICD-10-CM | POA: Diagnosis not present

## 2020-03-18 DIAGNOSIS — I69391 Dysphagia following cerebral infarction: Secondary | ICD-10-CM | POA: Diagnosis not present

## 2020-03-18 DIAGNOSIS — I10 Essential (primary) hypertension: Secondary | ICD-10-CM | POA: Diagnosis not present

## 2020-03-18 DIAGNOSIS — E119 Type 2 diabetes mellitus without complications: Secondary | ICD-10-CM | POA: Diagnosis not present

## 2020-03-18 DIAGNOSIS — I69351 Hemiplegia and hemiparesis following cerebral infarction affecting right dominant side: Secondary | ICD-10-CM | POA: Diagnosis not present

## 2020-03-19 ENCOUNTER — Telehealth: Payer: Self-pay | Admitting: Internal Medicine

## 2020-03-19 ENCOUNTER — Ambulatory Visit: Payer: Medicare Other | Admitting: Audiologist

## 2020-03-19 NOTE — Telephone Encounter (Signed)
LVM for pt to rtn my call to schedule AWV with [facility]. Please schedule AWV if pt calls the office  

## 2020-03-20 ENCOUNTER — Telehealth: Payer: Self-pay | Admitting: Internal Medicine

## 2020-03-20 DIAGNOSIS — R1310 Dysphagia, unspecified: Secondary | ICD-10-CM | POA: Diagnosis not present

## 2020-03-20 DIAGNOSIS — E119 Type 2 diabetes mellitus without complications: Secondary | ICD-10-CM | POA: Diagnosis not present

## 2020-03-20 DIAGNOSIS — I69328 Other speech and language deficits following cerebral infarction: Secondary | ICD-10-CM | POA: Diagnosis not present

## 2020-03-20 DIAGNOSIS — I69391 Dysphagia following cerebral infarction: Secondary | ICD-10-CM | POA: Diagnosis not present

## 2020-03-20 DIAGNOSIS — I69351 Hemiplegia and hemiparesis following cerebral infarction affecting right dominant side: Secondary | ICD-10-CM | POA: Diagnosis not present

## 2020-03-20 DIAGNOSIS — I10 Essential (primary) hypertension: Secondary | ICD-10-CM | POA: Diagnosis not present

## 2020-03-20 NOTE — Telephone Encounter (Signed)
  David [name] calling to request order to extend home PT 2x week for 4 1x week for 1  Phone [phone]

## 2020-03-23 DIAGNOSIS — I69391 Dysphagia following cerebral infarction: Secondary | ICD-10-CM | POA: Diagnosis not present

## 2020-03-23 DIAGNOSIS — E119 Type 2 diabetes mellitus without complications: Secondary | ICD-10-CM | POA: Diagnosis not present

## 2020-03-23 DIAGNOSIS — R1310 Dysphagia, unspecified: Secondary | ICD-10-CM | POA: Diagnosis not present

## 2020-03-23 DIAGNOSIS — I10 Essential (primary) hypertension: Secondary | ICD-10-CM | POA: Diagnosis not present

## 2020-03-23 DIAGNOSIS — I69328 Other speech and language deficits following cerebral infarction: Secondary | ICD-10-CM | POA: Diagnosis not present

## 2020-03-23 DIAGNOSIS — I69351 Hemiplegia and hemiparesis following cerebral infarction affecting right dominant side: Secondary | ICD-10-CM | POA: Diagnosis not present

## 2020-03-23 NOTE — Telephone Encounter (Signed)
    David [name] from [facility] requesting 1 additional visit this week for speech therapy

## 2020-03-23 NOTE — Telephone Encounter (Signed)
Notified PT ok to extend PT.Marland KitchenJohny Chess

## 2020-03-23 NOTE — Telephone Encounter (Signed)
Called David [name] there was no answer LMOM ok for 1 additional visit.Marland KitchenJohny Chess

## 2020-03-24 ENCOUNTER — Ambulatory Visit: Payer: Medicare Other | Attending: Adult Health | Admitting: Audiologist

## 2020-03-24 ENCOUNTER — Other Ambulatory Visit: Payer: Self-pay

## 2020-03-24 DIAGNOSIS — I69391 Dysphagia following cerebral infarction: Secondary | ICD-10-CM | POA: Diagnosis not present

## 2020-03-24 DIAGNOSIS — I10 Essential (primary) hypertension: Secondary | ICD-10-CM | POA: Diagnosis not present

## 2020-03-24 DIAGNOSIS — R1310 Dysphagia, unspecified: Secondary | ICD-10-CM | POA: Diagnosis not present

## 2020-03-24 DIAGNOSIS — H903 Sensorineural hearing loss, bilateral: Secondary | ICD-10-CM

## 2020-03-24 DIAGNOSIS — I69351 Hemiplegia and hemiparesis following cerebral infarction affecting right dominant side: Secondary | ICD-10-CM | POA: Diagnosis not present

## 2020-03-24 DIAGNOSIS — E119 Type 2 diabetes mellitus without complications: Secondary | ICD-10-CM | POA: Diagnosis not present

## 2020-03-24 DIAGNOSIS — I69328 Other speech and language deficits following cerebral infarction: Secondary | ICD-10-CM | POA: Diagnosis not present

## 2020-03-25 DIAGNOSIS — R1310 Dysphagia, unspecified: Secondary | ICD-10-CM | POA: Diagnosis not present

## 2020-03-25 DIAGNOSIS — I69391 Dysphagia following cerebral infarction: Secondary | ICD-10-CM | POA: Diagnosis not present

## 2020-03-25 DIAGNOSIS — I10 Essential (primary) hypertension: Secondary | ICD-10-CM | POA: Diagnosis not present

## 2020-03-25 DIAGNOSIS — E119 Type 2 diabetes mellitus without complications: Secondary | ICD-10-CM | POA: Diagnosis not present

## 2020-03-25 DIAGNOSIS — I69328 Other speech and language deficits following cerebral infarction: Secondary | ICD-10-CM | POA: Diagnosis not present

## 2020-03-25 DIAGNOSIS — I69351 Hemiplegia and hemiparesis following cerebral infarction affecting right dominant side: Secondary | ICD-10-CM | POA: Diagnosis not present

## 2020-03-25 NOTE — Procedures (Signed)
Outpatient Audiology and [place], [place]  [address] [phone]  AUDIOLOGICAL  EVALUATION  NAME: David [name]     DOB:   08-07-1923      MRN: [id number]                                                                                     DATE: 03/25/2020     REFERENT: David Anger, MD STATUS: Outpatient DIAGNOSIS: Sensory hearing loss, bilateral  History: David [name] was seen for an audiological evaluation. David [name] was accompanied to the appointment by his daughter. David [name] is receiving a hearing evaluation due to concerns for hearing loss. David [name] has difficulty hearing all environments. This difficulty began around 57 and has gradually gotten worse. David [name] said he was told he needed a hearing when he retired in the 80's but never pursued hearing aids. No constant pain or pressure reported in either ear.  Tinnitus present in both ears. 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 David [name]'s reporting accuracy was supported by his daughter. He would sometimes perseverate on a subject. His word recall has been impacted significantly. He is seeing a Electrical engineer for the aphasia post stroke and is followed by neurology. No other relevant case history reported.    Evaluation:   Otoscopy showed a clear view of the tympanic membranes, bilaterally  Tympanometry results were consistent with normal middle ear function, bilaterally    Audiometric testing was completed using conventional audiometry with insert transducer. Speech Recognition Thresholds were consistent with pure tone averages when using live voice. Word Recognition was very poor even at an elevated level. Pure tone thresholds show moderate sloping to profound hearing loss  in both ears. Test results are consistent with symmetric hearing loss with asymmetric speech  understanding, with the right ear speech understanding worse. This is likely due to the stroke.  Results:  The test results were reviewed with David [name] and his daughter. David [name] was informed he needs hearing aids to help him rehabilitate from his stroke. For word recognition in the right ear, he was able to guess very similar words (for example saying rice for nice). With context cues and familiar speakers he would likely have significantly better speech understanding in the right ear. Therefore despite there being 0% word recognition in the right ear, I would still have David [name] at least demo a hearing aid on the right ear. David [name] was informed of this but was not able to report understanding. Daughter confirmed that she understood the results and what has been recommended. David [name] was provided with copies of his audiogram. This report will be mailed home and sent to the referring provider.   Recommendations: 1. Amplification is necessary for both ears. Hearing aids can be purchased from a variety of locations. See provided list for locations in the [place] area.  2. If David [name] does not pursue amplification, then a pocket talker or other over the counter device is recommended for use during sessions with David [name]'s speech pathologist.    David [name]  Audiologist, Au.D., CCC-A  03/25/2020  9:12 AM    Outpatient Audiology and [place], [place] [address]  [place] [phone] [address], [address] [place], [place] [address] Appointments: [phone] 0.3 mile away from [facility]  [facility]  [place], [place] [address] Appointments: [phone] 1 mile away from [place] [facility] and [place] at [facility] and [employer] [place]. [place], [place] [address] Appointments:  [phone] 4 miles away from [place] [facility]  [facility] [address] [place], [place] [address] Appointments: [phone] 7 miles away from [name] Out[facility] Audiology and [place] [facility] [address] [place], [place] [address] Appointments: [phone] 11 miles away from [facility] Outpatient Audiology and [place], [facility] [place]. [place], [place] [address] Appointments: [phone] 34 miles away from [facility] and [address] [address] [place],  [address] Appointments: [phone] 22 miles away from [facility] Out[facility] and Rehabilitation   Cc: Plotnikov, [name], MD

## 2020-03-27 DIAGNOSIS — I69391 Dysphagia following cerebral infarction: Secondary | ICD-10-CM | POA: Diagnosis not present

## 2020-03-27 DIAGNOSIS — I2510 Atherosclerotic heart disease of native coronary artery without angina pectoris: Secondary | ICD-10-CM | POA: Diagnosis not present

## 2020-03-27 DIAGNOSIS — D696 Thrombocytopenia, unspecified: Secondary | ICD-10-CM | POA: Diagnosis not present

## 2020-03-27 DIAGNOSIS — D485 Neoplasm of uncertain behavior of skin: Secondary | ICD-10-CM | POA: Diagnosis not present

## 2020-03-27 DIAGNOSIS — F329 Major depressive disorder, single episode, unspecified: Secondary | ICD-10-CM | POA: Diagnosis not present

## 2020-03-27 DIAGNOSIS — Z87891 Personal history of nicotine dependence: Secondary | ICD-10-CM | POA: Diagnosis not present

## 2020-03-27 DIAGNOSIS — M1990 Unspecified osteoarthritis, unspecified site: Secondary | ICD-10-CM | POA: Diagnosis not present

## 2020-03-27 DIAGNOSIS — F32A Depression, unspecified: Secondary | ICD-10-CM | POA: Diagnosis not present

## 2020-03-27 DIAGNOSIS — R1310 Dysphagia, unspecified: Secondary | ICD-10-CM | POA: Diagnosis not present

## 2020-03-27 DIAGNOSIS — K219 Gastro-esophageal reflux disease without esophagitis: Secondary | ICD-10-CM | POA: Diagnosis not present

## 2020-03-27 DIAGNOSIS — Z9181 History of falling: Secondary | ICD-10-CM | POA: Diagnosis not present

## 2020-03-27 DIAGNOSIS — E119 Type 2 diabetes mellitus without complications: Secondary | ICD-10-CM | POA: Diagnosis not present

## 2020-03-27 DIAGNOSIS — Z993 Dependence on wheelchair: Secondary | ICD-10-CM | POA: Diagnosis not present

## 2020-03-27 DIAGNOSIS — I69351 Hemiplegia and hemiparesis following cerebral infarction affecting right dominant side: Secondary | ICD-10-CM | POA: Diagnosis not present

## 2020-03-27 DIAGNOSIS — J449 Chronic obstructive pulmonary disease, unspecified: Secondary | ICD-10-CM | POA: Diagnosis not present

## 2020-03-27 DIAGNOSIS — E785 Hyperlipidemia, unspecified: Secondary | ICD-10-CM | POA: Diagnosis not present

## 2020-03-27 DIAGNOSIS — Z952 Presence of prosthetic heart valve: Secondary | ICD-10-CM | POA: Diagnosis not present

## 2020-03-27 DIAGNOSIS — M545 Low back pain, unspecified: Secondary | ICD-10-CM | POA: Diagnosis not present

## 2020-03-27 DIAGNOSIS — I10 Essential (primary) hypertension: Secondary | ICD-10-CM | POA: Diagnosis not present

## 2020-03-27 DIAGNOSIS — F0789 Other personality and behavioral disorders due to known physiological condition: Secondary | ICD-10-CM | POA: Diagnosis not present

## 2020-03-27 DIAGNOSIS — I69328 Other speech and language deficits following cerebral infarction: Secondary | ICD-10-CM | POA: Diagnosis not present

## 2020-03-27 DIAGNOSIS — H8110 Benign paroxysmal vertigo, unspecified ear: Secondary | ICD-10-CM | POA: Diagnosis not present

## 2020-03-28 DIAGNOSIS — I69328 Other speech and language deficits following cerebral infarction: Secondary | ICD-10-CM | POA: Diagnosis not present

## 2020-03-28 DIAGNOSIS — E119 Type 2 diabetes mellitus without complications: Secondary | ICD-10-CM | POA: Diagnosis not present

## 2020-03-28 DIAGNOSIS — I10 Essential (primary) hypertension: Secondary | ICD-10-CM | POA: Diagnosis not present

## 2020-03-28 DIAGNOSIS — R1310 Dysphagia, unspecified: Secondary | ICD-10-CM | POA: Diagnosis not present

## 2020-03-28 DIAGNOSIS — I69391 Dysphagia following cerebral infarction: Secondary | ICD-10-CM | POA: Diagnosis not present

## 2020-03-28 DIAGNOSIS — I69351 Hemiplegia and hemiparesis following cerebral infarction affecting right dominant side: Secondary | ICD-10-CM | POA: Diagnosis not present

## 2020-03-30 ENCOUNTER — Ambulatory Visit (INDEPENDENT_AMBULATORY_CARE_PROVIDER_SITE_OTHER): Payer: Medicare Other | Admitting: Internal Medicine

## 2020-03-30 ENCOUNTER — Telehealth: Payer: Self-pay | Admitting: Internal Medicine

## 2020-03-30 ENCOUNTER — Ambulatory Visit: Payer: Medicare Other

## 2020-03-30 ENCOUNTER — Other Ambulatory Visit: Payer: Self-pay

## 2020-03-30 ENCOUNTER — Encounter: Payer: Self-pay | Admitting: Internal Medicine

## 2020-03-30 DIAGNOSIS — G8111 Spastic hemiplegia affecting right dominant side: Secondary | ICD-10-CM | POA: Diagnosis not present

## 2020-03-30 DIAGNOSIS — I69391 Dysphagia following cerebral infarction: Secondary | ICD-10-CM

## 2020-03-30 DIAGNOSIS — I2510 Atherosclerotic heart disease of native coronary artery without angina pectoris: Secondary | ICD-10-CM | POA: Diagnosis not present

## 2020-03-30 DIAGNOSIS — I611 Nontraumatic intracerebral hemorrhage in hemisphere, cortical: Secondary | ICD-10-CM | POA: Diagnosis not present

## 2020-03-30 DIAGNOSIS — E785 Hyperlipidemia, unspecified: Secondary | ICD-10-CM

## 2020-03-30 DIAGNOSIS — R2681 Unsteadiness on feet: Secondary | ICD-10-CM

## 2020-03-30 DIAGNOSIS — E119 Type 2 diabetes mellitus without complications: Secondary | ICD-10-CM | POA: Diagnosis not present

## 2020-03-30 DIAGNOSIS — I69328 Other speech and language deficits following cerebral infarction: Secondary | ICD-10-CM | POA: Diagnosis not present

## 2020-03-30 DIAGNOSIS — D751 Secondary polycythemia: Secondary | ICD-10-CM

## 2020-03-30 DIAGNOSIS — R1310 Dysphagia, unspecified: Secondary | ICD-10-CM | POA: Diagnosis not present

## 2020-03-30 DIAGNOSIS — I69351 Hemiplegia and hemiparesis following cerebral infarction affecting right dominant side: Secondary | ICD-10-CM | POA: Diagnosis not present

## 2020-03-30 DIAGNOSIS — I10 Essential (primary) hypertension: Secondary | ICD-10-CM | POA: Diagnosis not present

## 2020-03-30 LAB — CBC WITH DIFFERENTIAL/PLATELET
Basophils Absolute: 0 10*3/uL (ref 0.0–0.1)
Basophils Relative: 0.5 % (ref 0.0–3.0)
Eosinophils Absolute: 0.3 10*3/uL (ref 0.0–0.7)
Eosinophils Relative: 4.1 % (ref 0.0–5.0)
HCT: 47 % (ref 39.0–52.0)
Hemoglobin: 15.8 g/dL (ref 13.0–17.0)
Lymphocytes Relative: 22.6 % (ref 12.0–46.0)
Lymphs Abs: 1.9 10*3/uL (ref 0.7–4.0)
MCHC: 33.6 g/dL (ref 30.0–36.0)
MCV: 94.5 fl (ref 78.0–100.0)
Monocytes Absolute: 0.8 10*3/uL (ref 0.1–1.0)
Monocytes Relative: 10 % (ref 3.0–12.0)
Neutro Abs: 5.2 10*3/uL (ref 1.4–7.7)
Neutrophils Relative %: 62.8 % (ref 43.0–77.0)
Platelets: 122 10*3/uL — ABNORMAL LOW (ref 150.0–400.0)
RBC: 4.97 Mil/uL (ref 4.22–5.81)
RDW: 14.2 % (ref 11.5–15.5)
WBC: 8.3 10*3/uL (ref 4.0–10.5)

## 2020-03-30 LAB — COMPREHENSIVE METABOLIC PANEL
ALT: 13 U/L (ref 0–53)
AST: 15 U/L (ref 0–37)
Albumin: 3.7 g/dL (ref 3.5–5.2)
Alkaline Phosphatase: 60 U/L (ref 39–117)
BUN: 14 mg/dL (ref 6–23)
CO2: 30 mEq/L (ref 19–32)
Calcium: 9.3 mg/dL (ref 8.4–10.5)
Chloride: 104 mEq/L (ref 96–112)
Creatinine, Ser: 1.23 mg/dL (ref 0.40–1.50)
GFR: 50.21 mL/min — ABNORMAL LOW (ref >60.00)
Glucose, Bld: 138 mg/dL — ABNORMAL HIGH (ref 70–99)
Potassium: 4 mEq/L (ref 3.5–5.1)
Sodium: 141 mEq/L (ref 135–145)
Total Bilirubin: 0.7 mg/dL (ref 0.2–1.2)
Total Protein: 6.8 g/dL (ref 6.0–8.3)

## 2020-03-30 MED ORDER — CLOTRIMAZOLE-BETAMETHASONE 1-0.05 % EX CREA: 1.0000 "application " | TOPICAL_CREAM | Freq: Two times a day (BID) | CUTANEOUS | 1 refills | Status: DC

## 2020-03-30 NOTE — Assessment & Plan Note (Signed)
In PT 

## 2020-03-30 NOTE — Assessment & Plan Note (Signed)
Resolved

## 2020-03-30 NOTE — Assessment & Plan Note (Signed)
Re-start phlebotomies

## 2020-03-30 NOTE — Telephone Encounter (Signed)
David [name] an OT with [facility] calling stating he asked for a right side walker platform to be sent to [facility] and they never received orders for that [place]- [phone]

## 2020-03-30 NOTE — Assessment & Plan Note (Signed)
No CP 

## 2020-03-30 NOTE — Assessment & Plan Note (Signed)
On Zetia 

## 2020-03-30 NOTE — Assessment & Plan Note (Addendum)
BP control  try Lion's Mane Mushroom extract or capsules

## 2020-03-30 NOTE — Progress Notes (Signed)
Subjective:  Patient ID: David [name], male    DOB: Mar 15, 1924  Age: 86 y.o. MRN: [id number]  CC: Follow-up (2 month F/U)   HPI David [name] presents for rough skin lesions - better on [place] F/u CVA, HTN, CAD  Outpatient Medications Prior to Visit  Medication Sig Dispense Refill   acetaminophen (TYLENOL) 325 MG tablet Take 1-2 tablets (325-650 mg total) by mouth every 4 (four) hours as needed for mild pain.     amantadine (SYMMETREL) 100 MG capsule Take 1 capsule (100 mg total) by mouth 2 (two) times daily. 180 capsule 3   atenolol (TENORMIN) 25 MG tablet Take 1 tablet (25 mg total) by mouth daily. (Patient taking differently: Take 12.5 mg by mouth daily. ) 30 tablet 0   escitalopram (LEXAPRO) 5 MG tablet TAKE 1 TABLET BY MOUTH EVERYDAY AT BEDTIME 90 tablet 3   ezetimibe (ZETIA) 10 MG tablet Take 1 tablet (10 mg total) by mouth daily. 30 tablet 0   lactulose (CHRONULAC) 10 GM/15ML solution 30-60 ml bid po prn 1000 mL 5   pantoprazole (PROTONIX) 40 MG tablet Take 1 tablet (40 mg total) by mouth daily. 90 tablet 3   polyethylene glycol (MIRALAX / GLYCOLAX) 17 g packet Take 17 g by mouth daily.     senna-docusate (SENOKOT-S) 8.6-50 MG tablet Take 2 tablets by mouth 2 (two) times daily. 60 tablet 0   No facility-administered medications prior to visit.    ROS: Review of Systems  Constitutional: Positive for fatigue. Negative for appetite change and unexpected weight change.  HENT: Positive for hearing loss. Negative for congestion, nosebleeds, sneezing, sore throat and trouble swallowing.   Eyes: Negative for itching and visual disturbance.  Respiratory: Negative for cough.   Cardiovascular: Negative for chest pain, palpitations and leg swelling.  Gastrointestinal: Negative for abdominal distention, blood in stool, diarrhea and nausea.  Genitourinary: Negative for frequency and hematuria.  Musculoskeletal: Positive for gait problem. Negative for back pain, joint swelling  and neck pain.  Skin: Negative for rash.  Neurological: Positive for dizziness and weakness. Negative for tremors and speech difficulty.  Psychiatric/Behavioral: Negative for agitation, dysphoric mood, sleep disturbance and suicidal ideas. The patient is not nervous/anxious.     Objective:  BP 102/62 (BP Location: Left Arm)    Pulse 69    Temp 98 F (36.7 C) (Oral)    SpO2 98%   BP Readings from Last 3 Encounters:  03/30/20 102/62  02/19/20 124/68  01/29/20 (!) 90/50    Wt Readings from Last 3 Encounters:  02/19/20 190 lb (86.2 kg)  01/29/20 190 lb (86.2 kg)  12/18/19 192 lb (87.1 kg)    Physical Exam Constitutional:      General: He is not in acute distress.    Appearance: He is well-developed. He is obese. He is not ill-appearing.     Comments: NAD  Eyes:     Conjunctiva/sclera: Conjunctivae normal.     Pupils: Pupils are equal, round, and reactive to light.  Neck:     Thyroid: No thyromegaly.     Vascular: No JVD.  Cardiovascular:     Rate and Rhythm: Normal rate and regular rhythm.     Heart sounds: Normal heart sounds. No murmur heard.  No friction rub. No gallop.   Pulmonary:     Effort: Pulmonary effort is normal. No respiratory distress.     Breath sounds: Normal breath sounds. No wheezing or rales.  Chest:     Chest wall:  No tenderness.  Abdominal:     General: Bowel sounds are normal. There is no distension.     Palpations: Abdomen is soft. There is no mass.     Tenderness: There is no abdominal tenderness. There is no guarding or rebound.  Musculoskeletal:        General: No tenderness. Normal range of motion.     Cervical back: Normal range of motion.  Lymphadenopathy:     Cervical: No cervical adenopathy.  Skin:    General: Skin is warm and dry.     Findings: No rash.  Neurological:     Mental Status: He is alert and oriented to person, place, and time.     Cranial Nerves: No cranial nerve deficit.     Motor: No abnormal muscle tone.      Coordination: Coordination abnormal.     Gait: Gait abnormal.     Deep Tendon Reflexes: Reflexes are normal and symmetric.  Psychiatric:        Behavior: Behavior normal.        Thought Content: Thought content normal.        Judgment: Judgment normal.   in a w/c  Lab Results  Component Value Date   WBC 7.5 01/29/2020   HGB 17.9 (H) 01/29/2020   HCT 52.0 (H) 01/29/2020   PLT 122 (L) 01/29/2020   GLUCOSE 136 (H) 01/29/2020   CHOL 189 01/02/2020   TRIG 125 01/02/2020   HDL 43 01/02/2020   LDLDIRECT 136.2 08/01/2007   LDLCALC 122 (H) 01/02/2020   ALT 20 01/29/2020   AST 17 01/29/2020   NA 138 01/29/2020   K 4.4 01/29/2020   CL 100 01/29/2020   CREATININE 1.11 01/29/2020   BUN 12 01/29/2020   CO2 29 01/29/2020   TSH 1.63 01/02/2020   PSA 4.88 (H) 05/20/2019   INR 1.2 09/06/2019   HGBA1C 5.9 (H) 09/04/2019    DG Chest 2 View  Result Date: 09/10/2019 CLINICAL DATA:  Fever EXAM: CHEST - 2 VIEW COMPARISON:  09/04/2019 FINDINGS: Prior CABG and aortic valve replacement. The heart size and mediastinal contours are stable. Low lung volumes. Increased interstitial markings within the left lung base, slightly progressed from prior. No pneumothorax. IMPRESSION: Increased interstitial markings within the left lung base, slightly progressed from prior. Findings could represent atelectasis versus pneumonia. Electronically Signed   By: Davina Poke D.O.   On: 09/10/2019 16:33   DG Abd 1 View  Result Date: 09/10/2019 CLINICAL DATA:  Abdominal distension. EXAM: ABDOMEN - 1 VIEW COMPARISON:  None. FINDINGS: The bowel gas pattern is normal. Residual contrast is noted in the colon. No radio-opaque calculi or other significant radiographic abnormality are seen. IMPRESSION: No evidence of bowel obstruction or ileus. Electronically Signed   By: Marijo Conception M.D.   On: 09/10/2019 16:32   CT HEAD WO CONTRAST  Result Date: 09/10/2019 CLINICAL DATA:  Follow-up left basal ganglia hemorrhage.  Increased lethargy. EXAM: CT HEAD WITHOUT CONTRAST TECHNIQUE: Contiguous axial images were obtained from the base of the skull through the vertex without intravenous contrast. COMPARISON:  09/04/2019 FINDINGS: Brain: Slight enlargement of the left basal ganglia intraparenchymal hemorrhage, measuring 4.4 x 2.5 x 3.6 cm (volume = 21 cm^3) today compared with 16 cc volume 6 days ago. Additionally, there is intraventricular penetration, having breach the atrium of the left lateral ventricle, with a small amount of blood dependent in both occipital horns. Ventricular size remains stable however without hydrocephalus. Surrounding edema is slightly more pronounced.  One could question if there has been extension of infarction more anteriorly within the basal ganglia and external capsule or if this relates to edema from the hemorrhage. Other small-vessel ischemic changes throughout brain appear stable. No new large vessel infarction. No extra-axial collection. Vascular: There is atherosclerotic calcification of the major vessels at the base of the brain. Skull: Negative Sinuses/Orbits: Clear/normal Other: None IMPRESSION: Slight enlargement of the left basal ganglia intraparenchymal hemorrhage when compared to the study of 6 days ago. Previous volume was calculated at 16 cc. Today calculated at 21 cc. There is also intraventricular penetration with a small amount of blood dependent in the occipital horns of both lateral ventricles but no hydrocephalus. Slightly more surrounding edema as expected. Some edema extends anterior into the basal ganglia and external capsule region, which could either be extension of edema or slight extension of the infarction itself. These results will be called to the ordering clinician or representative by the Radiologist Assistant, and communication documented in the PACS or [employer]. Electronically Signed   By: Nelson Chimes M.D.   On: 09/10/2019 16:57    Assessment & Plan:    Walker Kehr, MD

## 2020-03-30 NOTE — Addendum Note (Signed)
Addended by: Trenda Moots on: 95/18/8416 01:57 PM   Modules accepted: Orders

## 2020-03-30 NOTE — Patient Instructions (Addendum)
Sign up for [employer] ( via [employer] on your phone or your ipad). If you don't have a Art therapist card  - go to [employer] branch. They will set you up in 15 minutes. It is free. You can check out books to read and to listen, check out magazines and newspapers, movies etc.    Exercise right arm, hand, leg  Exercise left side mentally   You can try Lion's Mane Mushroom extract or capsules for memory and brain recovery

## 2020-03-31 DIAGNOSIS — I69391 Dysphagia following cerebral infarction: Secondary | ICD-10-CM | POA: Diagnosis not present

## 2020-03-31 DIAGNOSIS — I69328 Other speech and language deficits following cerebral infarction: Secondary | ICD-10-CM | POA: Diagnosis not present

## 2020-03-31 DIAGNOSIS — I10 Essential (primary) hypertension: Secondary | ICD-10-CM | POA: Diagnosis not present

## 2020-03-31 DIAGNOSIS — R1310 Dysphagia, unspecified: Secondary | ICD-10-CM | POA: Diagnosis not present

## 2020-03-31 DIAGNOSIS — I69351 Hemiplegia and hemiparesis following cerebral infarction affecting right dominant side: Secondary | ICD-10-CM | POA: Diagnosis not present

## 2020-03-31 DIAGNOSIS — E119 Type 2 diabetes mellitus without complications: Secondary | ICD-10-CM | POA: Diagnosis not present

## 2020-03-31 NOTE — Telephone Encounter (Signed)
Okay.  Thanks.

## 2020-03-31 NOTE — Addendum Note (Signed)
Addended by: Earnstine Regal on: 03/31/2020 09:04 AM   Modules accepted: Orders

## 2020-03-31 NOTE — Telephone Encounter (Signed)
Generated DME faxed to [place].Marland KitchenJohny Chess

## 2020-04-01 DIAGNOSIS — I10 Essential (primary) hypertension: Secondary | ICD-10-CM | POA: Diagnosis not present

## 2020-04-01 DIAGNOSIS — I69328 Other speech and language deficits following cerebral infarction: Secondary | ICD-10-CM | POA: Diagnosis not present

## 2020-04-01 DIAGNOSIS — E119 Type 2 diabetes mellitus without complications: Secondary | ICD-10-CM | POA: Diagnosis not present

## 2020-04-01 DIAGNOSIS — I69351 Hemiplegia and hemiparesis following cerebral infarction affecting right dominant side: Secondary | ICD-10-CM | POA: Diagnosis not present

## 2020-04-01 DIAGNOSIS — I69391 Dysphagia following cerebral infarction: Secondary | ICD-10-CM | POA: Diagnosis not present

## 2020-04-01 DIAGNOSIS — R1310 Dysphagia, unspecified: Secondary | ICD-10-CM | POA: Diagnosis not present

## 2020-04-03 NOTE — Telephone Encounter (Signed)
[place]  Resubmit the form, [name] contacted [facility] and today and they said they never got the DME request

## 2020-04-03 NOTE — Telephone Encounter (Signed)
Refaxed to fax # given received confirmation it went through.Marland KitchenJohny Chess

## 2020-04-04 DIAGNOSIS — I69351 Hemiplegia and hemiparesis following cerebral infarction affecting right dominant side: Secondary | ICD-10-CM | POA: Diagnosis not present

## 2020-04-04 DIAGNOSIS — I69328 Other speech and language deficits following cerebral infarction: Secondary | ICD-10-CM | POA: Diagnosis not present

## 2020-04-04 DIAGNOSIS — I69391 Dysphagia following cerebral infarction: Secondary | ICD-10-CM | POA: Diagnosis not present

## 2020-04-04 DIAGNOSIS — R1310 Dysphagia, unspecified: Secondary | ICD-10-CM | POA: Diagnosis not present

## 2020-04-04 DIAGNOSIS — E119 Type 2 diabetes mellitus without complications: Secondary | ICD-10-CM | POA: Diagnosis not present

## 2020-04-04 DIAGNOSIS — I10 Essential (primary) hypertension: Secondary | ICD-10-CM | POA: Diagnosis not present

## 2020-04-07 DIAGNOSIS — I69391 Dysphagia following cerebral infarction: Secondary | ICD-10-CM | POA: Diagnosis not present

## 2020-04-07 DIAGNOSIS — I69328 Other speech and language deficits following cerebral infarction: Secondary | ICD-10-CM | POA: Diagnosis not present

## 2020-04-07 DIAGNOSIS — I69351 Hemiplegia and hemiparesis following cerebral infarction affecting right dominant side: Secondary | ICD-10-CM | POA: Diagnosis not present

## 2020-04-07 DIAGNOSIS — E119 Type 2 diabetes mellitus without complications: Secondary | ICD-10-CM | POA: Diagnosis not present

## 2020-04-07 DIAGNOSIS — R1310 Dysphagia, unspecified: Secondary | ICD-10-CM | POA: Diagnosis not present

## 2020-04-07 DIAGNOSIS — I10 Essential (primary) hypertension: Secondary | ICD-10-CM | POA: Diagnosis not present

## 2020-04-08 DIAGNOSIS — I69351 Hemiplegia and hemiparesis following cerebral infarction affecting right dominant side: Secondary | ICD-10-CM | POA: Diagnosis not present

## 2020-04-08 DIAGNOSIS — I69391 Dysphagia following cerebral infarction: Secondary | ICD-10-CM | POA: Diagnosis not present

## 2020-04-08 DIAGNOSIS — I10 Essential (primary) hypertension: Secondary | ICD-10-CM | POA: Diagnosis not present

## 2020-04-08 DIAGNOSIS — R1310 Dysphagia, unspecified: Secondary | ICD-10-CM | POA: Diagnosis not present

## 2020-04-08 DIAGNOSIS — E119 Type 2 diabetes mellitus without complications: Secondary | ICD-10-CM | POA: Diagnosis not present

## 2020-04-08 DIAGNOSIS — I69328 Other speech and language deficits following cerebral infarction: Secondary | ICD-10-CM | POA: Diagnosis not present

## 2020-04-11 DIAGNOSIS — R1310 Dysphagia, unspecified: Secondary | ICD-10-CM | POA: Diagnosis not present

## 2020-04-11 DIAGNOSIS — I69328 Other speech and language deficits following cerebral infarction: Secondary | ICD-10-CM | POA: Diagnosis not present

## 2020-04-11 DIAGNOSIS — I69391 Dysphagia following cerebral infarction: Secondary | ICD-10-CM | POA: Diagnosis not present

## 2020-04-11 DIAGNOSIS — E119 Type 2 diabetes mellitus without complications: Secondary | ICD-10-CM | POA: Diagnosis not present

## 2020-04-11 DIAGNOSIS — I10 Essential (primary) hypertension: Secondary | ICD-10-CM | POA: Diagnosis not present

## 2020-04-11 DIAGNOSIS — I69351 Hemiplegia and hemiparesis following cerebral infarction affecting right dominant side: Secondary | ICD-10-CM | POA: Diagnosis not present

## 2020-04-14 DIAGNOSIS — I10 Essential (primary) hypertension: Secondary | ICD-10-CM | POA: Diagnosis not present

## 2020-04-14 DIAGNOSIS — I69391 Dysphagia following cerebral infarction: Secondary | ICD-10-CM | POA: Diagnosis not present

## 2020-04-14 DIAGNOSIS — E119 Type 2 diabetes mellitus without complications: Secondary | ICD-10-CM | POA: Diagnosis not present

## 2020-04-14 DIAGNOSIS — I69328 Other speech and language deficits following cerebral infarction: Secondary | ICD-10-CM | POA: Diagnosis not present

## 2020-04-14 DIAGNOSIS — I69351 Hemiplegia and hemiparesis following cerebral infarction affecting right dominant side: Secondary | ICD-10-CM | POA: Diagnosis not present

## 2020-04-14 DIAGNOSIS — R1310 Dysphagia, unspecified: Secondary | ICD-10-CM | POA: Diagnosis not present

## 2020-04-15 DIAGNOSIS — I69328 Other speech and language deficits following cerebral infarction: Secondary | ICD-10-CM | POA: Diagnosis not present

## 2020-04-15 DIAGNOSIS — I69391 Dysphagia following cerebral infarction: Secondary | ICD-10-CM | POA: Diagnosis not present

## 2020-04-15 DIAGNOSIS — I69351 Hemiplegia and hemiparesis following cerebral infarction affecting right dominant side: Secondary | ICD-10-CM | POA: Diagnosis not present

## 2020-04-15 DIAGNOSIS — R1310 Dysphagia, unspecified: Secondary | ICD-10-CM | POA: Diagnosis not present

## 2020-04-15 DIAGNOSIS — E119 Type 2 diabetes mellitus without complications: Secondary | ICD-10-CM | POA: Diagnosis not present

## 2020-04-15 DIAGNOSIS — I10 Essential (primary) hypertension: Secondary | ICD-10-CM | POA: Diagnosis not present

## 2020-04-17 DIAGNOSIS — I69391 Dysphagia following cerebral infarction: Secondary | ICD-10-CM | POA: Diagnosis not present

## 2020-04-17 DIAGNOSIS — E119 Type 2 diabetes mellitus without complications: Secondary | ICD-10-CM | POA: Diagnosis not present

## 2020-04-17 DIAGNOSIS — I10 Essential (primary) hypertension: Secondary | ICD-10-CM | POA: Diagnosis not present

## 2020-04-17 DIAGNOSIS — R1310 Dysphagia, unspecified: Secondary | ICD-10-CM | POA: Diagnosis not present

## 2020-04-17 DIAGNOSIS — I69328 Other speech and language deficits following cerebral infarction: Secondary | ICD-10-CM | POA: Diagnosis not present

## 2020-04-17 DIAGNOSIS — I69351 Hemiplegia and hemiparesis following cerebral infarction affecting right dominant side: Secondary | ICD-10-CM | POA: Diagnosis not present

## 2020-04-21 ENCOUNTER — Telehealth: Payer: Self-pay | Admitting: Physical Medicine & Rehabilitation

## 2020-04-21 DIAGNOSIS — I69328 Other speech and language deficits following cerebral infarction: Secondary | ICD-10-CM | POA: Diagnosis not present

## 2020-04-21 DIAGNOSIS — I10 Essential (primary) hypertension: Secondary | ICD-10-CM | POA: Diagnosis not present

## 2020-04-21 DIAGNOSIS — R1310 Dysphagia, unspecified: Secondary | ICD-10-CM | POA: Diagnosis not present

## 2020-04-21 DIAGNOSIS — I69391 Dysphagia following cerebral infarction: Secondary | ICD-10-CM | POA: Diagnosis not present

## 2020-04-21 DIAGNOSIS — E119 Type 2 diabetes mellitus without complications: Secondary | ICD-10-CM | POA: Diagnosis not present

## 2020-04-21 DIAGNOSIS — I69351 Hemiplegia and hemiparesis following cerebral infarction affecting right dominant side: Secondary | ICD-10-CM | POA: Diagnosis not present

## 2020-04-21 NOTE — Telephone Encounter (Signed)
That would be fine. Set him up for 400u

## 2020-04-21 NOTE — Telephone Encounter (Signed)
Patient daughter Narda Rutherford called asking if he can get Botox at his appt 04/22/2020. He last had botox 12/25/19. She stated he needed it in his leg mainly but also in his arm.

## 2020-04-22 ENCOUNTER — Encounter: Payer: Self-pay | Admitting: Physical Medicine & Rehabilitation

## 2020-04-22 ENCOUNTER — Encounter: Payer: Medicare Other | Attending: Physical Medicine & Rehabilitation | Admitting: Physical Medicine & Rehabilitation

## 2020-04-22 ENCOUNTER — Other Ambulatory Visit: Payer: Self-pay

## 2020-04-22 VITALS — BP 124/67 | HR 64 | Temp 97.5°F | Ht 71.0 in

## 2020-04-22 DIAGNOSIS — G8111 Spastic hemiplegia affecting right dominant side: Secondary | ICD-10-CM

## 2020-04-22 NOTE — Progress Notes (Signed)
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 This includes bleeding, bruising, infection, excessive weakness, or medication side effects. A REMS form is on file and signed.  Right side Needle: 48mm injectable monopolar needle electrode  Number of units per muscle Pectoralis Major 100 units Pectoralis Minor 100 units Biceps 150 units Brachioradialis 50 units FCR 0 units FCU 0 units FDS 0 units FDP 0 units FPL 0 units Pronator Teres 0 units Pronator Quadratus 0 units Lumbricals 0 units Quadriceps 0 units Gastroc/soleus 0 units Hamstrings 0 units Tibialis Posterior 0 units Tibialis Anterior 0 units EHL 0 units All injections were done after obtaining appropriate EMG activity and after negative drawback for blood. The patient tolerated the procedure well. Post procedure instructions were given. Return in about 2 months (around 06/23/2020).

## 2020-04-22 NOTE — Patient Instructions (Addendum)
PLEASE FEEL FREE TO CALL OUR OFFICE WITH ANY PROBLEMS OR QUESTIONS ([phone])                                @                 @@               @@@                        @@@@                      @@@@@         @@@@@@                  @@@@@@@                @@@@@@@@              @@@@@@@@@             @@@@@@@@@@       IIII                  IIII                                                        HAPPY HOLIDAYS!!!!!  

## 2020-04-24 ENCOUNTER — Telehealth: Payer: Self-pay | Admitting: Internal Medicine

## 2020-04-24 DIAGNOSIS — I10 Essential (primary) hypertension: Secondary | ICD-10-CM | POA: Diagnosis not present

## 2020-04-24 DIAGNOSIS — I69351 Hemiplegia and hemiparesis following cerebral infarction affecting right dominant side: Secondary | ICD-10-CM | POA: Diagnosis not present

## 2020-04-24 DIAGNOSIS — R1310 Dysphagia, unspecified: Secondary | ICD-10-CM | POA: Diagnosis not present

## 2020-04-24 DIAGNOSIS — E119 Type 2 diabetes mellitus without complications: Secondary | ICD-10-CM | POA: Diagnosis not present

## 2020-04-24 DIAGNOSIS — I69328 Other speech and language deficits following cerebral infarction: Secondary | ICD-10-CM | POA: Diagnosis not present

## 2020-04-24 DIAGNOSIS — I69391 Dysphagia following cerebral infarction: Secondary | ICD-10-CM | POA: Diagnosis not present

## 2020-04-24 NOTE — Telephone Encounter (Signed)
Okay.  Thanks.

## 2020-04-24 NOTE — Telephone Encounter (Signed)
Charri with [facility] is requesting verbal orders for physical therapy 2w3 and 1w1  Phone: [phone]

## 2020-04-24 NOTE — Telephone Encounter (Signed)
Pt just had appt w/MD and is up to date. Called Charri gave verbal for PT.../lmb

## 2020-04-26 DIAGNOSIS — F0789 Other personality and behavioral disorders due to known physiological condition: Secondary | ICD-10-CM | POA: Diagnosis not present

## 2020-04-26 DIAGNOSIS — J449 Chronic obstructive pulmonary disease, unspecified: Secondary | ICD-10-CM | POA: Diagnosis not present

## 2020-04-26 DIAGNOSIS — I69391 Dysphagia following cerebral infarction: Secondary | ICD-10-CM | POA: Diagnosis not present

## 2020-04-26 DIAGNOSIS — Z993 Dependence on wheelchair: Secondary | ICD-10-CM | POA: Diagnosis not present

## 2020-04-26 DIAGNOSIS — I2510 Atherosclerotic heart disease of native coronary artery without angina pectoris: Secondary | ICD-10-CM | POA: Diagnosis not present

## 2020-04-26 DIAGNOSIS — F32A Depression, unspecified: Secondary | ICD-10-CM | POA: Diagnosis not present

## 2020-04-26 DIAGNOSIS — M545 Low back pain, unspecified: Secondary | ICD-10-CM | POA: Diagnosis not present

## 2020-04-26 DIAGNOSIS — Z9181 History of falling: Secondary | ICD-10-CM | POA: Diagnosis not present

## 2020-04-26 DIAGNOSIS — K219 Gastro-esophageal reflux disease without esophagitis: Secondary | ICD-10-CM | POA: Diagnosis not present

## 2020-04-26 DIAGNOSIS — I69351 Hemiplegia and hemiparesis following cerebral infarction affecting right dominant side: Secondary | ICD-10-CM | POA: Diagnosis not present

## 2020-04-26 DIAGNOSIS — E119 Type 2 diabetes mellitus without complications: Secondary | ICD-10-CM | POA: Diagnosis not present

## 2020-04-26 DIAGNOSIS — H8110 Benign paroxysmal vertigo, unspecified ear: Secondary | ICD-10-CM | POA: Diagnosis not present

## 2020-04-26 DIAGNOSIS — E785 Hyperlipidemia, unspecified: Secondary | ICD-10-CM | POA: Diagnosis not present

## 2020-04-26 DIAGNOSIS — D485 Neoplasm of uncertain behavior of skin: Secondary | ICD-10-CM | POA: Diagnosis not present

## 2020-04-26 DIAGNOSIS — M1990 Unspecified osteoarthritis, unspecified site: Secondary | ICD-10-CM | POA: Diagnosis not present

## 2020-04-26 DIAGNOSIS — I69328 Other speech and language deficits following cerebral infarction: Secondary | ICD-10-CM | POA: Diagnosis not present

## 2020-04-26 DIAGNOSIS — I10 Essential (primary) hypertension: Secondary | ICD-10-CM | POA: Diagnosis not present

## 2020-04-26 DIAGNOSIS — R1310 Dysphagia, unspecified: Secondary | ICD-10-CM | POA: Diagnosis not present

## 2020-04-26 DIAGNOSIS — Z952 Presence of prosthetic heart valve: Secondary | ICD-10-CM | POA: Diagnosis not present

## 2020-04-26 DIAGNOSIS — Z87891 Personal history of nicotine dependence: Secondary | ICD-10-CM | POA: Diagnosis not present

## 2020-04-26 DIAGNOSIS — D696 Thrombocytopenia, unspecified: Secondary | ICD-10-CM | POA: Diagnosis not present

## 2020-04-29 DIAGNOSIS — I10 Essential (primary) hypertension: Secondary | ICD-10-CM | POA: Diagnosis not present

## 2020-04-29 DIAGNOSIS — I69351 Hemiplegia and hemiparesis following cerebral infarction affecting right dominant side: Secondary | ICD-10-CM | POA: Diagnosis not present

## 2020-04-29 DIAGNOSIS — E119 Type 2 diabetes mellitus without complications: Secondary | ICD-10-CM | POA: Diagnosis not present

## 2020-04-29 DIAGNOSIS — I69391 Dysphagia following cerebral infarction: Secondary | ICD-10-CM | POA: Diagnosis not present

## 2020-04-29 DIAGNOSIS — R1310 Dysphagia, unspecified: Secondary | ICD-10-CM | POA: Diagnosis not present

## 2020-04-29 DIAGNOSIS — I69328 Other speech and language deficits following cerebral infarction: Secondary | ICD-10-CM | POA: Diagnosis not present

## 2020-05-01 DIAGNOSIS — I69328 Other speech and language deficits following cerebral infarction: Secondary | ICD-10-CM | POA: Diagnosis not present

## 2020-05-01 DIAGNOSIS — I69391 Dysphagia following cerebral infarction: Secondary | ICD-10-CM | POA: Diagnosis not present

## 2020-05-01 DIAGNOSIS — R1310 Dysphagia, unspecified: Secondary | ICD-10-CM | POA: Diagnosis not present

## 2020-05-01 DIAGNOSIS — I69351 Hemiplegia and hemiparesis following cerebral infarction affecting right dominant side: Secondary | ICD-10-CM | POA: Diagnosis not present

## 2020-05-01 DIAGNOSIS — E119 Type 2 diabetes mellitus without complications: Secondary | ICD-10-CM | POA: Diagnosis not present

## 2020-05-01 DIAGNOSIS — I10 Essential (primary) hypertension: Secondary | ICD-10-CM | POA: Diagnosis not present

## 2020-05-04 DIAGNOSIS — E119 Type 2 diabetes mellitus without complications: Secondary | ICD-10-CM | POA: Diagnosis not present

## 2020-05-04 DIAGNOSIS — I10 Essential (primary) hypertension: Secondary | ICD-10-CM | POA: Diagnosis not present

## 2020-05-04 DIAGNOSIS — I69328 Other speech and language deficits following cerebral infarction: Secondary | ICD-10-CM | POA: Diagnosis not present

## 2020-05-04 DIAGNOSIS — I69391 Dysphagia following cerebral infarction: Secondary | ICD-10-CM | POA: Diagnosis not present

## 2020-05-04 DIAGNOSIS — R1310 Dysphagia, unspecified: Secondary | ICD-10-CM | POA: Diagnosis not present

## 2020-05-04 DIAGNOSIS — I69351 Hemiplegia and hemiparesis following cerebral infarction affecting right dominant side: Secondary | ICD-10-CM | POA: Diagnosis not present

## 2020-05-05 ENCOUNTER — Other Ambulatory Visit: Payer: Self-pay | Admitting: Family

## 2020-05-05 DIAGNOSIS — K5901 Slow transit constipation: Secondary | ICD-10-CM

## 2020-05-06 DIAGNOSIS — I69328 Other speech and language deficits following cerebral infarction: Secondary | ICD-10-CM | POA: Diagnosis not present

## 2020-05-06 DIAGNOSIS — E119 Type 2 diabetes mellitus without complications: Secondary | ICD-10-CM | POA: Diagnosis not present

## 2020-05-06 DIAGNOSIS — R1310 Dysphagia, unspecified: Secondary | ICD-10-CM | POA: Diagnosis not present

## 2020-05-06 DIAGNOSIS — I10 Essential (primary) hypertension: Secondary | ICD-10-CM | POA: Diagnosis not present

## 2020-05-06 DIAGNOSIS — I69351 Hemiplegia and hemiparesis following cerebral infarction affecting right dominant side: Secondary | ICD-10-CM | POA: Diagnosis not present

## 2020-05-06 DIAGNOSIS — I69391 Dysphagia following cerebral infarction: Secondary | ICD-10-CM | POA: Diagnosis not present

## 2020-05-07 DIAGNOSIS — I69328 Other speech and language deficits following cerebral infarction: Secondary | ICD-10-CM | POA: Diagnosis not present

## 2020-05-07 DIAGNOSIS — I69391 Dysphagia following cerebral infarction: Secondary | ICD-10-CM | POA: Diagnosis not present

## 2020-05-07 DIAGNOSIS — R1310 Dysphagia, unspecified: Secondary | ICD-10-CM | POA: Diagnosis not present

## 2020-05-07 DIAGNOSIS — E119 Type 2 diabetes mellitus without complications: Secondary | ICD-10-CM | POA: Diagnosis not present

## 2020-05-07 DIAGNOSIS — I10 Essential (primary) hypertension: Secondary | ICD-10-CM | POA: Diagnosis not present

## 2020-05-07 DIAGNOSIS — I69351 Hemiplegia and hemiparesis following cerebral infarction affecting right dominant side: Secondary | ICD-10-CM | POA: Diagnosis not present

## 2020-05-11 DIAGNOSIS — R1310 Dysphagia, unspecified: Secondary | ICD-10-CM | POA: Diagnosis not present

## 2020-05-11 DIAGNOSIS — I69351 Hemiplegia and hemiparesis following cerebral infarction affecting right dominant side: Secondary | ICD-10-CM | POA: Diagnosis not present

## 2020-05-11 DIAGNOSIS — I10 Essential (primary) hypertension: Secondary | ICD-10-CM | POA: Diagnosis not present

## 2020-05-11 DIAGNOSIS — I69328 Other speech and language deficits following cerebral infarction: Secondary | ICD-10-CM | POA: Diagnosis not present

## 2020-05-11 DIAGNOSIS — E119 Type 2 diabetes mellitus without complications: Secondary | ICD-10-CM | POA: Diagnosis not present

## 2020-05-11 DIAGNOSIS — I69391 Dysphagia following cerebral infarction: Secondary | ICD-10-CM | POA: Diagnosis not present

## 2020-05-14 DIAGNOSIS — I69391 Dysphagia following cerebral infarction: Secondary | ICD-10-CM | POA: Diagnosis not present

## 2020-05-14 DIAGNOSIS — R1310 Dysphagia, unspecified: Secondary | ICD-10-CM | POA: Diagnosis not present

## 2020-05-14 DIAGNOSIS — I69328 Other speech and language deficits following cerebral infarction: Secondary | ICD-10-CM | POA: Diagnosis not present

## 2020-05-14 DIAGNOSIS — I69351 Hemiplegia and hemiparesis following cerebral infarction affecting right dominant side: Secondary | ICD-10-CM | POA: Diagnosis not present

## 2020-05-14 DIAGNOSIS — I10 Essential (primary) hypertension: Secondary | ICD-10-CM | POA: Diagnosis not present

## 2020-05-14 DIAGNOSIS — E119 Type 2 diabetes mellitus without complications: Secondary | ICD-10-CM | POA: Diagnosis not present

## 2020-05-18 DIAGNOSIS — R1310 Dysphagia, unspecified: Secondary | ICD-10-CM | POA: Diagnosis not present

## 2020-05-18 DIAGNOSIS — I10 Essential (primary) hypertension: Secondary | ICD-10-CM | POA: Diagnosis not present

## 2020-05-18 DIAGNOSIS — E119 Type 2 diabetes mellitus without complications: Secondary | ICD-10-CM | POA: Diagnosis not present

## 2020-05-18 DIAGNOSIS — I69391 Dysphagia following cerebral infarction: Secondary | ICD-10-CM | POA: Diagnosis not present

## 2020-05-18 DIAGNOSIS — I69328 Other speech and language deficits following cerebral infarction: Secondary | ICD-10-CM | POA: Diagnosis not present

## 2020-05-18 DIAGNOSIS — I69351 Hemiplegia and hemiparesis following cerebral infarction affecting right dominant side: Secondary | ICD-10-CM | POA: Diagnosis not present

## 2020-05-21 ENCOUNTER — Telehealth: Payer: Self-pay | Admitting: Internal Medicine

## 2020-05-21 DIAGNOSIS — I69391 Dysphagia following cerebral infarction: Secondary | ICD-10-CM | POA: Diagnosis not present

## 2020-05-21 DIAGNOSIS — I69328 Other speech and language deficits following cerebral infarction: Secondary | ICD-10-CM | POA: Diagnosis not present

## 2020-05-21 DIAGNOSIS — I10 Essential (primary) hypertension: Secondary | ICD-10-CM | POA: Diagnosis not present

## 2020-05-21 DIAGNOSIS — E119 Type 2 diabetes mellitus without complications: Secondary | ICD-10-CM | POA: Diagnosis not present

## 2020-05-21 DIAGNOSIS — R1310 Dysphagia, unspecified: Secondary | ICD-10-CM | POA: Diagnosis not present

## 2020-05-21 DIAGNOSIS — I69351 Hemiplegia and hemiparesis following cerebral infarction affecting right dominant side: Secondary | ICD-10-CM | POA: Diagnosis not present

## 2020-05-21 NOTE — Telephone Encounter (Signed)
Okay.  Thanks.

## 2020-05-21 NOTE — Telephone Encounter (Signed)
° °  Charri from [facility] calling to request verbal orders for physical therapy for 2 times a week for 4 weeks and 1 time a week for 1 week. [phone] Ok to lvm

## 2020-05-22 NOTE — Telephone Encounter (Signed)
Notified Charri w/MD response../lmb 

## 2020-05-25 DIAGNOSIS — I69328 Other speech and language deficits following cerebral infarction: Secondary | ICD-10-CM | POA: Diagnosis not present

## 2020-05-25 DIAGNOSIS — I69351 Hemiplegia and hemiparesis following cerebral infarction affecting right dominant side: Secondary | ICD-10-CM | POA: Diagnosis not present

## 2020-05-25 DIAGNOSIS — I10 Essential (primary) hypertension: Secondary | ICD-10-CM | POA: Diagnosis not present

## 2020-05-25 DIAGNOSIS — I69391 Dysphagia following cerebral infarction: Secondary | ICD-10-CM | POA: Diagnosis not present

## 2020-05-25 DIAGNOSIS — R1310 Dysphagia, unspecified: Secondary | ICD-10-CM | POA: Diagnosis not present

## 2020-05-25 DIAGNOSIS — E119 Type 2 diabetes mellitus without complications: Secondary | ICD-10-CM | POA: Diagnosis not present

## 2020-05-26 DIAGNOSIS — F0789 Other personality and behavioral disorders due to known physiological condition: Secondary | ICD-10-CM | POA: Diagnosis not present

## 2020-05-26 DIAGNOSIS — I69391 Dysphagia following cerebral infarction: Secondary | ICD-10-CM | POA: Diagnosis not present

## 2020-05-26 DIAGNOSIS — E785 Hyperlipidemia, unspecified: Secondary | ICD-10-CM | POA: Diagnosis not present

## 2020-05-26 DIAGNOSIS — Z952 Presence of prosthetic heart valve: Secondary | ICD-10-CM | POA: Diagnosis not present

## 2020-05-26 DIAGNOSIS — I69351 Hemiplegia and hemiparesis following cerebral infarction affecting right dominant side: Secondary | ICD-10-CM | POA: Diagnosis not present

## 2020-05-26 DIAGNOSIS — R1310 Dysphagia, unspecified: Secondary | ICD-10-CM | POA: Diagnosis not present

## 2020-05-26 DIAGNOSIS — J449 Chronic obstructive pulmonary disease, unspecified: Secondary | ICD-10-CM | POA: Diagnosis not present

## 2020-05-26 DIAGNOSIS — H8110 Benign paroxysmal vertigo, unspecified ear: Secondary | ICD-10-CM | POA: Diagnosis not present

## 2020-05-26 DIAGNOSIS — S61412D Laceration without foreign body of left hand, subsequent encounter: Secondary | ICD-10-CM | POA: Diagnosis not present

## 2020-05-26 DIAGNOSIS — Z993 Dependence on wheelchair: Secondary | ICD-10-CM | POA: Diagnosis not present

## 2020-05-26 DIAGNOSIS — I69328 Other speech and language deficits following cerebral infarction: Secondary | ICD-10-CM | POA: Diagnosis not present

## 2020-05-26 DIAGNOSIS — Z87891 Personal history of nicotine dependence: Secondary | ICD-10-CM | POA: Diagnosis not present

## 2020-05-26 DIAGNOSIS — D485 Neoplasm of uncertain behavior of skin: Secondary | ICD-10-CM | POA: Diagnosis not present

## 2020-05-26 DIAGNOSIS — M1990 Unspecified osteoarthritis, unspecified site: Secondary | ICD-10-CM | POA: Diagnosis not present

## 2020-05-26 DIAGNOSIS — F32A Depression, unspecified: Secondary | ICD-10-CM | POA: Diagnosis not present

## 2020-05-26 DIAGNOSIS — D696 Thrombocytopenia, unspecified: Secondary | ICD-10-CM | POA: Diagnosis not present

## 2020-05-26 DIAGNOSIS — I10 Essential (primary) hypertension: Secondary | ICD-10-CM | POA: Diagnosis not present

## 2020-05-26 DIAGNOSIS — L89322 Pressure ulcer of left buttock, stage 2: Secondary | ICD-10-CM | POA: Diagnosis not present

## 2020-05-26 DIAGNOSIS — E119 Type 2 diabetes mellitus without complications: Secondary | ICD-10-CM | POA: Diagnosis not present

## 2020-05-26 DIAGNOSIS — Z9181 History of falling: Secondary | ICD-10-CM | POA: Diagnosis not present

## 2020-05-26 DIAGNOSIS — M545 Low back pain, unspecified: Secondary | ICD-10-CM | POA: Diagnosis not present

## 2020-05-26 DIAGNOSIS — I2510 Atherosclerotic heart disease of native coronary artery without angina pectoris: Secondary | ICD-10-CM | POA: Diagnosis not present

## 2020-05-27 ENCOUNTER — Telehealth: Payer: Self-pay | Admitting: Internal Medicine

## 2020-05-27 NOTE — Telephone Encounter (Signed)
Lattie Haw from [facility] calling, states the patient has developed a wound on his sacrum and they were wondering if they could send out a nurse to evaluate the wound. Asking if this could be done today as well Lattie Haw- [place]

## 2020-05-27 NOTE — Telephone Encounter (Signed)
Called Lattie Haw ok verbal for nurse evaluation.Marland KitchenJohny Chess

## 2020-05-28 DIAGNOSIS — I10 Essential (primary) hypertension: Secondary | ICD-10-CM | POA: Diagnosis not present

## 2020-05-28 DIAGNOSIS — I69328 Other speech and language deficits following cerebral infarction: Secondary | ICD-10-CM | POA: Diagnosis not present

## 2020-05-28 DIAGNOSIS — S61412D Laceration without foreign body of left hand, subsequent encounter: Secondary | ICD-10-CM | POA: Diagnosis not present

## 2020-05-28 DIAGNOSIS — E119 Type 2 diabetes mellitus without complications: Secondary | ICD-10-CM | POA: Diagnosis not present

## 2020-05-28 DIAGNOSIS — I69351 Hemiplegia and hemiparesis following cerebral infarction affecting right dominant side: Secondary | ICD-10-CM | POA: Diagnosis not present

## 2020-05-28 DIAGNOSIS — R1310 Dysphagia, unspecified: Secondary | ICD-10-CM | POA: Diagnosis not present

## 2020-05-28 DIAGNOSIS — L89322 Pressure ulcer of left buttock, stage 2: Secondary | ICD-10-CM | POA: Diagnosis not present

## 2020-05-28 DIAGNOSIS — I69391 Dysphagia following cerebral infarction: Secondary | ICD-10-CM | POA: Diagnosis not present

## 2020-05-29 DIAGNOSIS — S61412D Laceration without foreign body of left hand, subsequent encounter: Secondary | ICD-10-CM | POA: Diagnosis not present

## 2020-05-29 DIAGNOSIS — I69351 Hemiplegia and hemiparesis following cerebral infarction affecting right dominant side: Secondary | ICD-10-CM | POA: Diagnosis not present

## 2020-05-29 DIAGNOSIS — R1310 Dysphagia, unspecified: Secondary | ICD-10-CM | POA: Diagnosis not present

## 2020-05-29 DIAGNOSIS — L89322 Pressure ulcer of left buttock, stage 2: Secondary | ICD-10-CM | POA: Diagnosis not present

## 2020-05-29 DIAGNOSIS — E119 Type 2 diabetes mellitus without complications: Secondary | ICD-10-CM | POA: Diagnosis not present

## 2020-05-29 DIAGNOSIS — I69391 Dysphagia following cerebral infarction: Secondary | ICD-10-CM | POA: Diagnosis not present

## 2020-05-29 DIAGNOSIS — I10 Essential (primary) hypertension: Secondary | ICD-10-CM | POA: Diagnosis not present

## 2020-05-29 DIAGNOSIS — I69328 Other speech and language deficits following cerebral infarction: Secondary | ICD-10-CM | POA: Diagnosis not present

## 2020-05-29 NOTE — Telephone Encounter (Signed)
Okay.  Thanks.

## 2020-06-01 ENCOUNTER — Telehealth: Payer: Medicare Other | Admitting: Internal Medicine

## 2020-06-01 ENCOUNTER — Ambulatory Visit: Payer: Medicare Other

## 2020-06-01 DIAGNOSIS — I639 Cerebral infarction, unspecified: Secondary | ICD-10-CM

## 2020-06-02 DIAGNOSIS — E119 Type 2 diabetes mellitus without complications: Secondary | ICD-10-CM | POA: Diagnosis not present

## 2020-06-02 DIAGNOSIS — I69328 Other speech and language deficits following cerebral infarction: Secondary | ICD-10-CM | POA: Diagnosis not present

## 2020-06-02 DIAGNOSIS — I69351 Hemiplegia and hemiparesis following cerebral infarction affecting right dominant side: Secondary | ICD-10-CM | POA: Diagnosis not present

## 2020-06-02 DIAGNOSIS — L89322 Pressure ulcer of left buttock, stage 2: Secondary | ICD-10-CM | POA: Diagnosis not present

## 2020-06-02 DIAGNOSIS — I10 Essential (primary) hypertension: Secondary | ICD-10-CM | POA: Diagnosis not present

## 2020-06-02 DIAGNOSIS — R1310 Dysphagia, unspecified: Secondary | ICD-10-CM | POA: Diagnosis not present

## 2020-06-02 DIAGNOSIS — S61412D Laceration without foreign body of left hand, subsequent encounter: Secondary | ICD-10-CM | POA: Diagnosis not present

## 2020-06-02 DIAGNOSIS — I69391 Dysphagia following cerebral infarction: Secondary | ICD-10-CM | POA: Diagnosis not present

## 2020-06-03 ENCOUNTER — Telehealth: Payer: Self-pay | Admitting: Internal Medicine

## 2020-06-03 NOTE — Telephone Encounter (Signed)
Notified [name] gave verbal to start OT next week.David KitchenJohny Chess

## 2020-06-03 NOTE — Telephone Encounter (Signed)
Ok Thx 

## 2020-06-03 NOTE — Telephone Encounter (Signed)
[name] w/ [facility] called and said that the patient will be missing a visit this week because he is not feeling well to see the patient and is also requesting verbals to start OT for 1w3 starting next week.    Okay to LVM: [phone]

## 2020-06-04 DIAGNOSIS — S61412D Laceration without foreign body of left hand, subsequent encounter: Secondary | ICD-10-CM | POA: Diagnosis not present

## 2020-06-04 DIAGNOSIS — L89322 Pressure ulcer of left buttock, stage 2: Secondary | ICD-10-CM | POA: Diagnosis not present

## 2020-06-04 DIAGNOSIS — I69351 Hemiplegia and hemiparesis following cerebral infarction affecting right dominant side: Secondary | ICD-10-CM | POA: Diagnosis not present

## 2020-06-04 DIAGNOSIS — I69328 Other speech and language deficits following cerebral infarction: Secondary | ICD-10-CM | POA: Diagnosis not present

## 2020-06-04 DIAGNOSIS — R1310 Dysphagia, unspecified: Secondary | ICD-10-CM | POA: Diagnosis not present

## 2020-06-04 DIAGNOSIS — E119 Type 2 diabetes mellitus without complications: Secondary | ICD-10-CM | POA: Diagnosis not present

## 2020-06-04 DIAGNOSIS — I10 Essential (primary) hypertension: Secondary | ICD-10-CM | POA: Diagnosis not present

## 2020-06-04 DIAGNOSIS — I69391 Dysphagia following cerebral infarction: Secondary | ICD-10-CM | POA: Diagnosis not present

## 2020-06-05 ENCOUNTER — Ambulatory Visit (INDEPENDENT_AMBULATORY_CARE_PROVIDER_SITE_OTHER): Payer: Medicare Other

## 2020-06-05 DIAGNOSIS — Z0000 Encounter for general adult medical examination without abnormal findings: Secondary | ICD-10-CM | POA: Diagnosis not present

## 2020-06-05 NOTE — Patient Instructions (Signed)
David [name] , Thank you for taking time to come for your Medicare Wellness Visit. I appreciate your ongoing commitment to your health goals. Please review the following plan we discussed and let me know if I can assist you in the future.   Screening recommendations/referrals: Colonoscopy: not a candidate for colon cancer screening due to age Recommended yearly ophthalmology/optometry visit for glaucoma screening and checkup Recommended yearly dental visit for hygiene and checkup  Vaccinations: Influenza vaccine: 01/29/2020 Pneumococcal vaccine: completed Tdap vaccine: 05/04/2011; due every 10 years Shingles vaccine: never done   Covid-19: completed  Advanced directives: Documents on file.  Conditions/risks identified: Yes. Reviewed health maintenance screenings with patient today and relevant education, vaccines, and/or referrals were provided. Continue doing brain stimulating activities (puzzles, reading, adult coloring books, staying active) to keep memory sharp. Continue to eat heart healthy diet (full of fruits, vegetables, whole grains, lean protein, water--limit salt, fat, and sugar intake) and increase physical activity as tolerated.  Next appointment: Please schedule your next Medicare Wellness Visit with your Nurse Health Advisor in 1 year by calling [phone].  Preventive Care 32 Years and Older, Male Preventive care refers to lifestyle choices and visits with your health care provider that can promote health and wellness. What does preventive care include?  A yearly physical exam. This is also called an annual well check.  Dental exams once or twice a year.  Routine eye exams. Ask your health care provider how often you should have your eyes checked.  Personal lifestyle choices, including:  Daily care of your teeth and gums.  Regular physical activity.  Eating a healthy diet.  Avoiding tobacco and drug use.  Limiting alcohol use.  Practicing safe sex.  Taking  low doses of aspirin every day.  Taking vitamin and mineral supplements as recommended by your health care provider. What happens during an annual well check? The services and screenings done by your health care provider during your annual well check will depend on your age, overall health, lifestyle risk factors, and family history of disease. Counseling  Your health care provider may ask you questions about your:  Alcohol use.  Tobacco use.  Drug use.  Emotional well-being.  Home and relationship well-being.  Sexual activity.  Eating habits.  History of falls.  Memory and ability to understand (cognition).  Work and work Statistician. Screening  You may have the following tests or measurements:  Height, weight, and BMI.  Blood pressure.  Lipid and cholesterol levels. These may be checked every 5 years, or more frequently if you are over 53 years old.  Skin check.  Lung cancer screening. You may have this screening every year starting at age 1 if you have a 30-pack-year history of smoking and currently smoke or have quit within the past 15 years.  Fecal occult blood test (FOBT) of the stool. You may have this test every year starting at age 72.  Flexible sigmoidoscopy or colonoscopy. You may have a sigmoidoscopy every 5 years or a colonoscopy every 10 years starting at age 27.  Prostate cancer screening. Recommendations will vary depending on your family history and other risks.  Hepatitis C blood test.  Hepatitis B blood test.  Sexually transmitted disease (STD) testing.  Diabetes screening. This is done by checking your blood sugar (glucose) after you have not eaten for a while (fasting). You may have this done every 1-3 years.  Abdominal aortic aneurysm (AAA) screening. You may need this if you are a current or former smoker.  Osteoporosis. You may be screened starting at age 38 if you are at high risk. Talk with your health care provider about your test  results, treatment options, and if necessary, the need for more tests. Vaccines  Your health care provider may recommend certain vaccines, such as:  Influenza vaccine. This is recommended every year.  Tetanus, diphtheria, and acellular pertussis (Tdap, Td) vaccine. You may need a Td booster every 10 years.  Zoster vaccine. You may need this after age 58.  Pneumococcal 13-valent conjugate (PCV13) vaccine. One dose is recommended after age 37.  Pneumococcal polysaccharide (PPSV23) vaccine. One dose is recommended after age 70. Talk to your health care provider about which screenings and vaccines you need and how often you need them. This information is not intended to replace advice given to you by your health care provider. Make sure you discuss any questions you have with your health care provider. Document Released: 05/29/2015 Document Revised: 01/20/2016 Document Reviewed: 03/03/2015 Elsevier Interactive Patient Education  2017 [place] Prevention in the Home Falls can cause injuries. They can happen to people of all ages. There are many things you can do to make your home safe and to help prevent falls. What can I do on the outside of my home?  Regularly fix the edges of walkways and driveways and fix any cracks.  Remove anything that might make you trip as you walk through a door, such as a raised step or threshold.  Trim any bushes or trees on the path to your home.  Use bright outdoor lighting.  Clear any walking paths of anything that might make someone trip, such as rocks or tools.  Regularly check to see if handrails are loose or broken. Make sure that both sides of any steps have handrails.  Any raised decks and porches should have guardrails on the edges.  Have any leaves, snow, or ice cleared regularly.  Use sand or salt on walking paths during winter.  Clean up any spills in your garage right away. This includes oil or grease spills. What can I do in  the bathroom?  Use night lights.  Install grab bars by the toilet and in the tub and shower. Do not use towel bars as grab bars.  Use non-skid mats or decals in the tub or shower.  If you need to sit down in the shower, use a plastic, non-slip stool.  Keep the floor dry. Clean up any water that spills on the floor as soon as it happens.  Remove soap buildup in the tub or shower regularly.  Attach bath mats securely with double-sided non-slip rug tape.  Do not have throw rugs and other things on the floor that can make you trip. What can I do in the bedroom?  Use night lights.  Make sure that you have a light by your bed that is easy to reach.  Do not use any sheets or blankets that are too big for your bed. They should not hang down onto the floor.  Have a firm chair that has side arms. You can use this for support while you get dressed.  Do not have throw rugs and other things on the floor that can make you trip. What can I do in the kitchen?  Clean up any spills right away.  Avoid walking on wet floors.  Keep items that you use a lot in easy-to-reach places.  If you need to reach something above you, use a strong step  stool that has a grab bar.  Keep electrical cords out of the way.  Do not use floor polish or wax that makes floors slippery. If you must use wax, use non-skid floor wax.  Do not have throw rugs and other things on the floor that can make you trip. What can I do with my stairs?  Do not leave any items on the stairs.  Make sure that there are handrails on both sides of the stairs and use them. Fix handrails that are broken or loose. Make sure that handrails are as long as the stairways.  Check any carpeting to make sure that it is firmly attached to the stairs. Fix any carpet that is loose or worn.  Avoid having throw rugs at the top or bottom of the stairs. If you do have throw rugs, attach them to the floor with carpet tape.  Make sure that you  have a light switch at the top of the stairs and the bottom of the stairs. If you do not have them, ask someone to add them for you. What else can I do to help prevent falls?  Wear shoes that:  Do not have high heels.  Have rubber bottoms.  Are comfortable and fit you well.  Are closed at the toe. Do not wear sandals.  If you use a stepladder:  Make sure that it is fully opened. Do not climb a closed stepladder.  Make sure that both sides of the stepladder are locked into place.  Ask someone to hold it for you, if possible.  Clearly mark and make sure that you can see:  Any grab bars or handrails.  First and last steps.  Where the edge of each step is.  Use tools that help you move around (mobility aids) if they are needed. These include:  Canes.  Walkers.  Scooters.  Crutches.  Turn on the lights when you go into a dark area. Replace any light bulbs as soon as they burn out.  Set up your furniture so you have a clear path. Avoid moving your furniture around.  If any of your floors are uneven, fix them.  If there are any pets around you, be aware of where they are.  Review your medicines with your doctor. Some medicines can make you feel dizzy. This can increase your chance of falling. Ask your doctor what other things that you can do to help prevent falls. This information is not intended to replace advice given to you by your health care provider. Make sure you discuss any questions you have with your health care provider. Document Released: 02/26/2009 Document Revised: 10/08/2015 Document Reviewed: 06/06/2014 Elsevier Interactive Patient Education  2017 [employer].

## 2020-06-05 NOTE — Progress Notes (Addendum)
I connected with David [name] today by telephone and verified that I am speaking with the correct person using two identifiers. Location patient: home Location provider: work Persons participating in the virtual visit: David [name], [place] (per DPR) and Lisette Abu, LPN.  I discussed the limitations, risks, security and privacy concerns of performing an evaluation and management service by telephone and the availability of in person appointments. I also discussed with the patient that there may be a patient responsible charge related to this service. The patient expressed understanding and verbally consented to this telephonic visit.    Interactive audio and video telecommunications were attempted between this provider and patient, however failed, due to patient having technical difficulties OR patient did not have access to video capability.  We continued and completed visit with audio only.  Some vital signs may be absent or patient reported.   Time Spent with patient on telephone encounter: 30 minutes  Subjective:   David [name] is a 87 y.o. male who presents for Medicare Annual/Subsequent preventive examination.  Review of Systems    No ROS. Medicare Wellness Visit. Additional risk factors are reflected in social history. Cardiac Risk Factors include: advanced age (>36men, >42 women);dyslipidemia;hypertension;male gender;family history of premature cardiovascular disease     Objective:    There were no vitals filed for this visit. There is no height or weight on file to calculate BMI.  Advanced Directives 06/05/2020 12/18/2019 10/08/2019 09/09/2019 09/04/2019 12/04/2017  Does Patient Have a Medical Advance Directive? Yes Yes Yes Yes Yes Yes  Type of Advance Directive - [place];Living will;Out of facility DNR (pink MOST or yellow form) Out of facility DNR (pink MOST or yellow form) [place];Living will [place];Living will [place];Living will  Does patient want to make changes to medical advance directive? No - Patient declined No - Patient declined No - Patient declined No - Patient declined No - Patient declined -  Copy of [place] in Chart? - Yes - validated most recent copy scanned in chart (See row information) - No - copy requested No - copy requested No - copy requested    Current Medications (verified) Outpatient Encounter Medications as of 06/05/2020  Medication Sig   acetaminophen (TYLENOL) 325 MG tablet Take 1-2 tablets (325-650 mg total) by mouth every 4 (four) hours as needed for mild pain.   amantadine (SYMMETREL) 100 MG capsule Take 1 capsule (100 mg total) by mouth 2 (two) times daily.   atenolol (TENORMIN) 25 MG tablet Take 1 tablet (25 mg total) by mouth daily. (Patient taking differently: Take 12.5 mg by mouth daily. )   clotrimazole-betamethasone (LOTRISONE) cream Apply 1 application topically 2 (two) times daily.   escitalopram (LEXAPRO) 5 MG tablet TAKE 1 TABLET BY MOUTH EVERYDAY AT BEDTIME   ezetimibe (ZETIA) 10 MG tablet Take 1 tablet (10 mg total) by mouth daily.   lactulose (CHRONULAC) 10 GM/15ML solution 30-60 ml bid po prn   pantoprazole (PROTONIX) 40 MG tablet Take 1 tablet (40 mg total) by mouth daily.   polyethylene glycol (MIRALAX / GLYCOLAX) 17 g packet Take 17 g by mouth daily.   senna-docusate (SENOKOT-S) 8.6-50 MG tablet Take 2 tablets by mouth 2 (two) times daily.   No facility-administered encounter medications on file as of 06/05/2020.    Allergies (verified) Atorvastatin, Colesevelam, Simvastatin, and Sulfa antibiotics   History: Past Medical History:  Diagnosis Date   CAD (coronary artery  disease)    intolerance of statins   COPD (chronic obstructive pulmonary disease) (HCC)    Current use of long term anticoagulation    GERD (gastroesophageal reflux disease)    Hyperlipidemia    Hypertension     Impaired glucose tolerance 02/14/2011   LBP (low back pain)    OA (osteoarthritis)    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   Past Surgical History:  Procedure Laterality Date   AORTIC VALVE REPLACEMENT  01/15/1999   bovine   CARDIAC CATHETERIZATION  10/05/1998   Recommend CABG revascularizaition   CARDIAC CATHETERIZATION  06/11/1999   Continue medical therapy   CARDIAC CATHETERIZATION  01/08/2007   Patent grafts, recommend medical therapy   CARDIOVASCULAR STRESS TEST  04/22/2010   Small lateral defect which is partially reversible, no ischemic EKG changes were noted.   CAROTID DOPPLER  04/22/2010   Bilateral ICAs-no evidence of diametere reduction, significant tortuosity, or any other vascular abnormality.   CORONARY ARTERY BYPASS GRAFT  10/09/1998   LIMA to LAD, SVG to diag, SVG sequential to 2 marginals, SVG sequential to PDA & PLA. CE#25 porcine AVR.   TRANSTHORACIC ECHOCARDIOGRAM  05/14/2012   EF 50-55%, bioprosthesis of the aortic valve with well preserved LV function, moderate concentric [place]   Family History  Problem Relation Age of Onset   Coronary artery disease Other    Hypertension Other    Social History   Socioeconomic History   Marital status: Widowed    Spouse name: Not on file   Number of children: Not on file   Years of education: Not on file   Highest education level: Not on file  Occupational History   Not on file  Tobacco Use   Smoking status: Former Smoker    Types: Pipe    Quit date: 05/16/1998    Years since quitting: 22.0   Smokeless tobacco: Former Counsellor Use: Never used  Substance and Sexual Activity   Alcohol use: No   Drug use: No   Sexual activity: Never  Other Topics Concern   Not on file  Social History Narrative   Not on file   Social Determinants of Health   Financial Resource Strain: Low Risk    Difficulty of Paying Living Expenses: Not hard at all  Food Insecurity: No Food Insecurity   Worried  About Charity fundraiser in the Last Year: Never true   Arboriculturist in the Last Year: Never true  Transportation Needs: No Transportation Needs   Lack of Transportation (Medical): No   Lack of Transportation (Non-Medical): No  Physical Activity: Sufficiently Active   Days of Exercise per Week: 3 days   Minutes of Exercise per Session: 50 min  Stress: No Stress Concern Present   Feeling of Stress : Not at all  Social Connections: Socially Isolated   Frequency of Communication with Friends and Family: More than three times a week   Frequency of Social Gatherings with Friends and Family: Once a week   Attends Religious Services: Never   Marine scientist or Organizations: No   Attends Archivist Meetings: Never   Marital Status: Widowed    Tobacco Counseling Counseling given: Not Answered   Clinical Intake:  Pre-visit preparation completed: Yes  Pain : No/denies pain     Nutritional Risks: None Diabetes: No  How often do you need to have someone help you when you read instructions,  pamphlets, or other written materials from your doctor or pharmacy?: 1 - Never What is the last grade level you completed in school?: 7th grade  Diabetic? no  Interpreter Needed?: No  Comments: David [name] (daughter) assisted with visit Information entered by :: Lisette Abu, LPN.   Activities of Daily Living In your present state of health, do you have any difficulty performing the following activities: 06/05/2020 09/09/2019  Hearing? Tempie Donning  Vision? N N  Difficulty concentrating or making decisions? Tempie Donning  Walking or climbing stairs? Y Y  Dressing or bathing? Y Y  Doing errands, shopping? Y N  Preparing Food and eating ? Y -  Using the Toilet? Y -  In the past six months, have you accidently leaked urine? Y -  Do you have problems with loss of bowel control? Y -  Managing your Medications? Y -  Managing your Finances? Y -  Housekeeping or managing your Housekeeping?  Y -  Some recent data might be hidden    Patient Care Team: Plotnikov, [name], MD as PCP - General (Internal Medicine) Troy Sine, MD as PCP - Cardiology (Cardiology) Meredith Staggers, MD as Consulting Physician (Physical Medicine and Rehabilitation)  Indicate any recent Medical Services you may have received from other than [facility] providers in the past year (date may be approximate).     Assessment:   This is a routine wellness examination for [place].  Hearing/Vision screen No exam data present  Dietary issues and exercise activities discussed: Current Exercise Habits: The patient does not participate in regular exercise at present, Type of exercise: Other - see comments (Physical and occupational therapy weekly), Time (Minutes): 40, Frequency (Times/Week): 3, Weekly Exercise (Minutes/Week): 120, Intensity: Mild  Goals      Patient Stated     Continue to eat well be active physically and socially. Continue to travel in my motor home, enjoy life, family and be active in the [employer].       Depression Screen PHQ 2/9 Scores 06/05/2020 02/19/2020 10/30/2019 05/20/2019 12/04/2017 12/02/2016 12/02/2015  PHQ - 2 Score 0 0 1 0 0 0 0  PHQ- 9 Score - - 8 - - - -    Fall Risk Fall Risk  06/05/2020 04/22/2020 02/19/2020 12/25/2019 10/30/2019  Falls in the past year? 0 1 0 0 0  Number falls in past yr: 0 0 0 - -  Injury with Fall? 0 0 0 - -  Risk for fall due to : Impaired balance/gait;Impaired mobility - - - Impaired balance/gait    FALL RISK PREVENTION PERTAINING TO THE HOME:  Any stairs in or around the home? No  If so, are there any without handrails? No  Home free of loose throw rugs in walkways, pet beds, electrical cords, etc? Yes  Adequate lighting in your home to reduce risk of falls? Yes   ASSISTIVE DEVICES UTILIZED TO PREVENT FALLS:  Life alert? No  Use of a cane, walker or w/c? Yes  Grab bars in the bathroom? Yes  Shower chair or bench in shower? Yes  Elevated  toilet seat or a handicapped toilet? Yes   TIMED UP AND GO:  Was the test performed? No .  Length of time to ambulate 10 feet: 0 sec.   Appearance of gait: Daughter stated that patient uses a wheelchair.  Cognitive Function: MMSE - Mini Mental State Exam 12/04/2017  Orientation to time 5  Orientation to Place 5  Registration 3  Attention/ Calculation 5  Recall 1  Language- name 2 objects 2  Language- repeat 1  Language- follow 3 step command 3  Language- read & follow direction 1  Write a sentence 1  Copy design 1  Total score 28        Immunizations Immunization History  Administered Date(s) Administered   Fluad Quad(high Dose 65+) 01/29/2020   Influenza Whole 02/17/2011, 02/14/2012   Influenza-Unspecified 02/13/2013, 02/12/2014, 02/14/2015, 02/27/2018   PFIZER(Purple Top)SARS-COV-2 Vaccination 06/05/2019, 06/26/2019   Pneumococcal Conjugate-13 11/14/2013   Pneumococcal Polysaccharide-23 03/09/2012   Td 05/04/2011    TDAP status: Up to date  Flu Vaccine status: Up to date  Pneumococcal vaccine status: Up to date  Covid-19 vaccine status: Completed vaccines  Qualifies for Shingles Vaccine? Yes   Zostavax completed No   Shingrix Completed?: No.    Education has been provided regarding the importance of this vaccine. Patient has been advised to call insurance company to determine out of pocket expense if they have not yet received this vaccine. Advised may also receive vaccine at local pharmacy or Health Dept. Verbalized acceptance and understanding.  Screening Tests Health Maintenance  Topic Date Due   URINE MICROALBUMIN  Never done   COVID-19 Vaccine (3 - Pfizer risk 4-dose series) 07/24/2019   TETANUS/TDAP  05/03/2021   INFLUENZA VACCINE  Completed   PNA vac Low Risk Adult  Completed    Health Maintenance  Health Maintenance Due  Topic Date Due   URINE MICROALBUMIN  Never done   COVID-19 Vaccine (3 - Pfizer risk 4-dose series) 07/24/2019     Colorectal cancer screening: No longer required.   Lung Cancer Screening: (Low Dose CT Chest recommended if Age 34-80 years, 30 pack-year currently smoking OR have quit w/in 15years.) does not qualify.   Lung Cancer Screening Referral: no  Additional Screening:  Hepatitis C Screening: does not qualify; Completed no  Vision Screening: Recommended annual ophthalmology exams for early detection of glaucoma and other disorders of the eye. Is the patient up to date with their annual eye exam?  Yes  Who is the provider or what is the name of the office in which the patient attends annual eye exams? [facility] If pt is not established with a provider, would they like to be referred to a provider to establish care? No .   Dental Screening: Recommended annual dental exams for proper oral hygiene  Community Resource Referral / Chronic Care Management: CRR required this visit?  No   CCM required this visit?  No      Plan:     I have personally reviewed and noted the following in the patient's chart:   Medical and social history Use of alcohol, tobacco or illicit drugs  Current medications and supplements Functional ability and status Nutritional status Physical activity Advanced directives List of other physicians Hospitalizations, surgeries, and ER visits in previous 12 months Vitals Screenings to include cognitive, depression, and falls Referrals and appointments  In addition, I have reviewed and discussed with patient certain preventive protocols, quality metrics, and best practice recommendations. A written personalized care plan for preventive services as well as general preventive health recommendations were provided to patient.     Sheral Flow, LPN   [id number]   Nurse Notes:  There were no vitals filed for this visit. There is no height or weight on file to calculate BMI. Not able to complete screening 6CIT or MMSE for this visit.   Medical screening  examination/treatment/procedure(s) were performed by non-physician  practitioner and as supervising physician I was immediately available for consultation/collaboration.  I agree with above. Lew Dawes, MD

## 2020-06-08 ENCOUNTER — Encounter: Payer: Self-pay | Admitting: Internal Medicine

## 2020-06-08 ENCOUNTER — Telehealth: Payer: Self-pay | Admitting: Internal Medicine

## 2020-06-08 DIAGNOSIS — E119 Type 2 diabetes mellitus without complications: Secondary | ICD-10-CM | POA: Diagnosis not present

## 2020-06-08 DIAGNOSIS — S61412D Laceration without foreign body of left hand, subsequent encounter: Secondary | ICD-10-CM | POA: Diagnosis not present

## 2020-06-08 DIAGNOSIS — I69351 Hemiplegia and hemiparesis following cerebral infarction affecting right dominant side: Secondary | ICD-10-CM | POA: Diagnosis not present

## 2020-06-08 DIAGNOSIS — R1310 Dysphagia, unspecified: Secondary | ICD-10-CM | POA: Diagnosis not present

## 2020-06-08 DIAGNOSIS — M109 Gout, unspecified: Secondary | ICD-10-CM

## 2020-06-08 DIAGNOSIS — I69391 Dysphagia following cerebral infarction: Secondary | ICD-10-CM | POA: Diagnosis not present

## 2020-06-08 DIAGNOSIS — R2681 Unsteadiness on feet: Secondary | ICD-10-CM

## 2020-06-08 DIAGNOSIS — I69328 Other speech and language deficits following cerebral infarction: Secondary | ICD-10-CM | POA: Diagnosis not present

## 2020-06-08 DIAGNOSIS — I10 Essential (primary) hypertension: Secondary | ICD-10-CM | POA: Diagnosis not present

## 2020-06-08 DIAGNOSIS — L89322 Pressure ulcer of left buttock, stage 2: Secondary | ICD-10-CM | POA: Diagnosis not present

## 2020-06-08 NOTE — Telephone Encounter (Signed)
Patients daughter calling, states PT told her that the patient needs a leg/foot brace order for "AFO for RLE to prevent injury". If we can place this order it needs to go to [facility] and the fax is [phone]

## 2020-06-08 NOTE — Progress Notes (Signed)
No show

## 2020-06-09 NOTE — Telephone Encounter (Signed)
Called pt daughter to verify which foot/leg. Pt is needing brace for right foot. Entered order and faxed to [facility].Marland KitchenJohny Chess

## 2020-06-09 NOTE — Telephone Encounter (Signed)
Okay with me. Thanks 

## 2020-06-09 NOTE — Addendum Note (Signed)
Addended by: Earnstine Regal on: 06/09/2020 09:40 AM   Modules accepted: Orders

## 2020-06-10 DIAGNOSIS — I10 Essential (primary) hypertension: Secondary | ICD-10-CM | POA: Diagnosis not present

## 2020-06-10 DIAGNOSIS — I69351 Hemiplegia and hemiparesis following cerebral infarction affecting right dominant side: Secondary | ICD-10-CM | POA: Diagnosis not present

## 2020-06-10 DIAGNOSIS — L89322 Pressure ulcer of left buttock, stage 2: Secondary | ICD-10-CM | POA: Diagnosis not present

## 2020-06-10 DIAGNOSIS — E119 Type 2 diabetes mellitus without complications: Secondary | ICD-10-CM | POA: Diagnosis not present

## 2020-06-10 DIAGNOSIS — R1310 Dysphagia, unspecified: Secondary | ICD-10-CM | POA: Diagnosis not present

## 2020-06-10 DIAGNOSIS — I69328 Other speech and language deficits following cerebral infarction: Secondary | ICD-10-CM | POA: Diagnosis not present

## 2020-06-10 DIAGNOSIS — S61412D Laceration without foreign body of left hand, subsequent encounter: Secondary | ICD-10-CM | POA: Diagnosis not present

## 2020-06-10 DIAGNOSIS — I69391 Dysphagia following cerebral infarction: Secondary | ICD-10-CM | POA: Diagnosis not present

## 2020-06-12 DIAGNOSIS — I69328 Other speech and language deficits following cerebral infarction: Secondary | ICD-10-CM | POA: Diagnosis not present

## 2020-06-12 DIAGNOSIS — R1310 Dysphagia, unspecified: Secondary | ICD-10-CM | POA: Diagnosis not present

## 2020-06-12 DIAGNOSIS — E119 Type 2 diabetes mellitus without complications: Secondary | ICD-10-CM | POA: Diagnosis not present

## 2020-06-12 DIAGNOSIS — I69351 Hemiplegia and hemiparesis following cerebral infarction affecting right dominant side: Secondary | ICD-10-CM | POA: Diagnosis not present

## 2020-06-12 DIAGNOSIS — S61412D Laceration without foreign body of left hand, subsequent encounter: Secondary | ICD-10-CM | POA: Diagnosis not present

## 2020-06-12 DIAGNOSIS — I69391 Dysphagia following cerebral infarction: Secondary | ICD-10-CM | POA: Diagnosis not present

## 2020-06-12 DIAGNOSIS — L89322 Pressure ulcer of left buttock, stage 2: Secondary | ICD-10-CM | POA: Diagnosis not present

## 2020-06-12 DIAGNOSIS — I10 Essential (primary) hypertension: Secondary | ICD-10-CM | POA: Diagnosis not present

## 2020-06-15 DIAGNOSIS — E119 Type 2 diabetes mellitus without complications: Secondary | ICD-10-CM | POA: Diagnosis not present

## 2020-06-15 DIAGNOSIS — I69351 Hemiplegia and hemiparesis following cerebral infarction affecting right dominant side: Secondary | ICD-10-CM | POA: Diagnosis not present

## 2020-06-15 DIAGNOSIS — S61412D Laceration without foreign body of left hand, subsequent encounter: Secondary | ICD-10-CM | POA: Diagnosis not present

## 2020-06-15 DIAGNOSIS — I69391 Dysphagia following cerebral infarction: Secondary | ICD-10-CM | POA: Diagnosis not present

## 2020-06-15 DIAGNOSIS — I69328 Other speech and language deficits following cerebral infarction: Secondary | ICD-10-CM | POA: Diagnosis not present

## 2020-06-15 DIAGNOSIS — R1310 Dysphagia, unspecified: Secondary | ICD-10-CM | POA: Diagnosis not present

## 2020-06-15 DIAGNOSIS — L89322 Pressure ulcer of left buttock, stage 2: Secondary | ICD-10-CM | POA: Diagnosis not present

## 2020-06-15 DIAGNOSIS — I10 Essential (primary) hypertension: Secondary | ICD-10-CM | POA: Diagnosis not present

## 2020-06-16 DIAGNOSIS — S61412D Laceration without foreign body of left hand, subsequent encounter: Secondary | ICD-10-CM | POA: Diagnosis not present

## 2020-06-16 DIAGNOSIS — E119 Type 2 diabetes mellitus without complications: Secondary | ICD-10-CM | POA: Diagnosis not present

## 2020-06-16 DIAGNOSIS — L89322 Pressure ulcer of left buttock, stage 2: Secondary | ICD-10-CM | POA: Diagnosis not present

## 2020-06-16 DIAGNOSIS — I10 Essential (primary) hypertension: Secondary | ICD-10-CM | POA: Diagnosis not present

## 2020-06-16 DIAGNOSIS — I69328 Other speech and language deficits following cerebral infarction: Secondary | ICD-10-CM | POA: Diagnosis not present

## 2020-06-16 DIAGNOSIS — R1310 Dysphagia, unspecified: Secondary | ICD-10-CM | POA: Diagnosis not present

## 2020-06-16 DIAGNOSIS — I69391 Dysphagia following cerebral infarction: Secondary | ICD-10-CM | POA: Diagnosis not present

## 2020-06-16 DIAGNOSIS — I69351 Hemiplegia and hemiparesis following cerebral infarction affecting right dominant side: Secondary | ICD-10-CM | POA: Diagnosis not present

## 2020-06-17 ENCOUNTER — Ambulatory Visit: Payer: Medicare Other | Admitting: Internal Medicine

## 2020-06-19 DIAGNOSIS — I69351 Hemiplegia and hemiparesis following cerebral infarction affecting right dominant side: Secondary | ICD-10-CM | POA: Diagnosis not present

## 2020-06-19 DIAGNOSIS — S61412D Laceration without foreign body of left hand, subsequent encounter: Secondary | ICD-10-CM | POA: Diagnosis not present

## 2020-06-19 DIAGNOSIS — I69391 Dysphagia following cerebral infarction: Secondary | ICD-10-CM | POA: Diagnosis not present

## 2020-06-19 DIAGNOSIS — E119 Type 2 diabetes mellitus without complications: Secondary | ICD-10-CM | POA: Diagnosis not present

## 2020-06-19 DIAGNOSIS — R1310 Dysphagia, unspecified: Secondary | ICD-10-CM | POA: Diagnosis not present

## 2020-06-19 DIAGNOSIS — L89322 Pressure ulcer of left buttock, stage 2: Secondary | ICD-10-CM | POA: Diagnosis not present

## 2020-06-19 DIAGNOSIS — I10 Essential (primary) hypertension: Secondary | ICD-10-CM | POA: Diagnosis not present

## 2020-06-19 DIAGNOSIS — I69328 Other speech and language deficits following cerebral infarction: Secondary | ICD-10-CM | POA: Diagnosis not present

## 2020-06-22 ENCOUNTER — Telehealth: Payer: Self-pay | Admitting: Internal Medicine

## 2020-06-22 DIAGNOSIS — L89322 Pressure ulcer of left buttock, stage 2: Secondary | ICD-10-CM | POA: Diagnosis not present

## 2020-06-22 DIAGNOSIS — I69328 Other speech and language deficits following cerebral infarction: Secondary | ICD-10-CM | POA: Diagnosis not present

## 2020-06-22 DIAGNOSIS — I69351 Hemiplegia and hemiparesis following cerebral infarction affecting right dominant side: Secondary | ICD-10-CM | POA: Diagnosis not present

## 2020-06-22 DIAGNOSIS — I69391 Dysphagia following cerebral infarction: Secondary | ICD-10-CM | POA: Diagnosis not present

## 2020-06-22 DIAGNOSIS — R1310 Dysphagia, unspecified: Secondary | ICD-10-CM | POA: Diagnosis not present

## 2020-06-22 DIAGNOSIS — S61412D Laceration without foreign body of left hand, subsequent encounter: Secondary | ICD-10-CM | POA: Diagnosis not present

## 2020-06-22 DIAGNOSIS — E119 Type 2 diabetes mellitus without complications: Secondary | ICD-10-CM | POA: Diagnosis not present

## 2020-06-22 DIAGNOSIS — I10 Essential (primary) hypertension: Secondary | ICD-10-CM | POA: Diagnosis not present

## 2020-06-22 NOTE — Telephone Encounter (Signed)
Charri from [facility] home health calling requesting verbal orders. Requesting to extend PT for 2 times a week for 3 weeks and 1 time for 1 week and to start next week.  Jerl [phone] Okay to lvm

## 2020-06-24 ENCOUNTER — Telehealth: Payer: Self-pay | Admitting: Internal Medicine

## 2020-06-24 ENCOUNTER — Encounter: Payer: Medicare Other | Admitting: Physical Medicine & Rehabilitation

## 2020-06-24 DIAGNOSIS — I69351 Hemiplegia and hemiparesis following cerebral infarction affecting right dominant side: Secondary | ICD-10-CM | POA: Diagnosis not present

## 2020-06-24 DIAGNOSIS — L89322 Pressure ulcer of left buttock, stage 2: Secondary | ICD-10-CM | POA: Diagnosis not present

## 2020-06-24 DIAGNOSIS — S61412D Laceration without foreign body of left hand, subsequent encounter: Secondary | ICD-10-CM | POA: Diagnosis not present

## 2020-06-24 DIAGNOSIS — E119 Type 2 diabetes mellitus without complications: Secondary | ICD-10-CM | POA: Diagnosis not present

## 2020-06-24 DIAGNOSIS — R1310 Dysphagia, unspecified: Secondary | ICD-10-CM | POA: Diagnosis not present

## 2020-06-24 DIAGNOSIS — I69391 Dysphagia following cerebral infarction: Secondary | ICD-10-CM | POA: Diagnosis not present

## 2020-06-24 DIAGNOSIS — I69328 Other speech and language deficits following cerebral infarction: Secondary | ICD-10-CM | POA: Diagnosis not present

## 2020-06-24 DIAGNOSIS — I10 Essential (primary) hypertension: Secondary | ICD-10-CM | POA: Diagnosis not present

## 2020-06-24 NOTE — Telephone Encounter (Signed)
[name] w/ [facility] is requesting verbals to extent OT for 1w4 beginning 06/29/2020.    Okay to LVM [phone]

## 2020-06-24 NOTE — Telephone Encounter (Signed)
Okay.  Thanks.

## 2020-06-24 NOTE — Telephone Encounter (Signed)
Called David [name] there was no answer LMOMw/MD response../lm,b

## 2020-06-24 NOTE — Telephone Encounter (Signed)
Called [name] had to leave msg for verbal to extent OT.Marland KitchenJohny Chess

## 2020-06-25 DIAGNOSIS — I2510 Atherosclerotic heart disease of native coronary artery without angina pectoris: Secondary | ICD-10-CM | POA: Diagnosis not present

## 2020-06-25 DIAGNOSIS — D696 Thrombocytopenia, unspecified: Secondary | ICD-10-CM | POA: Diagnosis not present

## 2020-06-25 DIAGNOSIS — F0789 Other personality and behavioral disorders due to known physiological condition: Secondary | ICD-10-CM | POA: Diagnosis not present

## 2020-06-25 DIAGNOSIS — I69351 Hemiplegia and hemiparesis following cerebral infarction affecting right dominant side: Secondary | ICD-10-CM | POA: Diagnosis not present

## 2020-06-25 DIAGNOSIS — L89322 Pressure ulcer of left buttock, stage 2: Secondary | ICD-10-CM | POA: Diagnosis not present

## 2020-06-25 DIAGNOSIS — J449 Chronic obstructive pulmonary disease, unspecified: Secondary | ICD-10-CM | POA: Diagnosis not present

## 2020-06-25 DIAGNOSIS — M1990 Unspecified osteoarthritis, unspecified site: Secondary | ICD-10-CM | POA: Diagnosis not present

## 2020-06-25 DIAGNOSIS — R1310 Dysphagia, unspecified: Secondary | ICD-10-CM | POA: Diagnosis not present

## 2020-06-25 DIAGNOSIS — Z952 Presence of prosthetic heart valve: Secondary | ICD-10-CM | POA: Diagnosis not present

## 2020-06-25 DIAGNOSIS — I10 Essential (primary) hypertension: Secondary | ICD-10-CM | POA: Diagnosis not present

## 2020-06-25 DIAGNOSIS — E119 Type 2 diabetes mellitus without complications: Secondary | ICD-10-CM | POA: Diagnosis not present

## 2020-06-25 DIAGNOSIS — D485 Neoplasm of uncertain behavior of skin: Secondary | ICD-10-CM | POA: Diagnosis not present

## 2020-06-25 DIAGNOSIS — I69391 Dysphagia following cerebral infarction: Secondary | ICD-10-CM | POA: Diagnosis not present

## 2020-06-25 DIAGNOSIS — Z9181 History of falling: Secondary | ICD-10-CM | POA: Diagnosis not present

## 2020-06-25 DIAGNOSIS — F32A Depression, unspecified: Secondary | ICD-10-CM | POA: Diagnosis not present

## 2020-06-25 DIAGNOSIS — E785 Hyperlipidemia, unspecified: Secondary | ICD-10-CM | POA: Diagnosis not present

## 2020-06-25 DIAGNOSIS — Z87891 Personal history of nicotine dependence: Secondary | ICD-10-CM | POA: Diagnosis not present

## 2020-06-25 DIAGNOSIS — M545 Low back pain, unspecified: Secondary | ICD-10-CM | POA: Diagnosis not present

## 2020-06-25 DIAGNOSIS — H8110 Benign paroxysmal vertigo, unspecified ear: Secondary | ICD-10-CM | POA: Diagnosis not present

## 2020-06-29 ENCOUNTER — Other Ambulatory Visit: Payer: Self-pay

## 2020-06-29 ENCOUNTER — Ambulatory Visit (INDEPENDENT_AMBULATORY_CARE_PROVIDER_SITE_OTHER): Payer: Medicare Other | Admitting: Internal Medicine

## 2020-06-29 ENCOUNTER — Encounter: Payer: Self-pay | Admitting: Internal Medicine

## 2020-06-29 DIAGNOSIS — I639 Cerebral infarction, unspecified: Secondary | ICD-10-CM | POA: Diagnosis not present

## 2020-06-29 DIAGNOSIS — F418 Other specified anxiety disorders: Secondary | ICD-10-CM

## 2020-06-29 DIAGNOSIS — I10 Essential (primary) hypertension: Secondary | ICD-10-CM | POA: Diagnosis not present

## 2020-06-29 MED ORDER — ESCITALOPRAM OXALATE 10 MG PO TABS: 10.0000 mg | ORAL_TABLET | Freq: Every day | ORAL | 3 refills | Status: DC

## 2020-06-29 NOTE — Assessment & Plan Note (Signed)
BP Readings from Last 3 Encounters:  06/29/20 128/66  04/22/20 124/67  03/30/20 102/62  On Atenolol

## 2020-06-29 NOTE — Assessment & Plan Note (Signed)
Situational - CVA related Worse Increase Lexapro to 10 mg/d

## 2020-06-29 NOTE — Progress Notes (Signed)
Subjective:  Patient ID: David [name], male    DOB: May 21, 1923  Age: 87 y.o. MRN: [id number]  CC: Follow-up (2 month f/u)   HPI David [name] presents for CVA, HTN, CAD; pressure sores - buttocks [facility] w[facility] care now. He is here w/his daughter. Part of the hx is obtained from her.  Outpatient Medications Prior to Visit  Medication Sig Dispense Refill  . acetaminophen (TYLENOL) 325 MG tablet Take 1-2 tablets (325-650 mg total) by mouth every 4 (four) hours as needed for mild pain.    Marland Kitchen amantadine (SYMMETREL) 100 MG capsule Take 1 capsule (100 mg total) by mouth 2 (two) times daily. 180 capsule 3  . atenolol (TENORMIN) 25 MG tablet Take 1 tablet (25 mg total) by mouth daily. (Patient taking differently: Take 12.5 mg by mouth daily.) 30 tablet 0  . clotrimazole-betamethasone (LOTRISONE) cream Apply 1 application topically 2 (two) times daily. 90 g 1  . escitalopram (LEXAPRO) 5 MG tablet TAKE 1 TABLET BY MOUTH EVERYDAY AT BEDTIME 90 tablet 3  . ezetimibe (ZETIA) 10 MG tablet Take 1 tablet (10 mg total) by mouth daily. 30 tablet 0  . lactulose (CHRONULAC) 10 GM/15ML solution 30-60 ml bid po prn 1000 mL 5  . pantoprazole (PROTONIX) 40 MG tablet Take 1 tablet (40 mg total) by mouth daily. 90 tablet 3  . polyethylene glycol (MIRALAX / GLYCOLAX) 17 g packet Take 17 g by mouth daily.    Marland Kitchen senna-docusate (SENOKOT-S) 8.6-50 MG tablet Take 2 tablets by mouth 2 (two) times daily. 60 tablet 0   No facility-administered medications prior to visit.    ROS: Review of Systems  Constitutional: Positive for fatigue. Negative for appetite change and unexpected weight change.  HENT: Positive for hearing loss. Negative for congestion, nosebleeds, sneezing, sore throat and trouble swallowing.   Eyes: Negative for itching and visual disturbance.  Respiratory: Negative for cough.   Cardiovascular: Negative for chest pain, palpitations and leg swelling.  Gastrointestinal: Negative for abdominal  distention, blood in stool, diarrhea and nausea.  Genitourinary: Negative for frequency and hematuria.  Musculoskeletal: Positive for arthralgias and gait problem. Negative for back pain, joint swelling and neck pain.  Skin: Negative for rash.  Neurological: Positive for weakness. Negative for dizziness, tremors and speech difficulty.  Psychiatric/Behavioral: Positive for decreased concentration. Negative for agitation, dysphoric mood, sleep disturbance and suicidal ideas. The patient is not nervous/anxious.     Objective:  BP 128/66 (BP Location: Left Arm)   Pulse 62   SpO2 97%   BP Readings from Last 3 Encounters:  06/29/20 128/66  04/22/20 124/67  03/30/20 102/62    Wt Readings from Last 3 Encounters:  02/19/20 190 lb (86.2 kg)  01/29/20 190 lb (86.2 kg)  12/18/19 192 lb (87.1 kg)    Physical Exam Constitutional:      General: He is not in acute distress.    Appearance: Normal appearance. He is well-developed.     Comments: NAD  HENT:     Mouth/Throat:     Mouth: Oropharynx is clear and moist.  Eyes:     Conjunctiva/sclera: Conjunctivae normal.     Pupils: Pupils are equal, round, and reactive to light.  Neck:     Thyroid: No thyromegaly.     Vascular: No JVD.  Cardiovascular:     Rate and Rhythm: Normal rate and regular rhythm.     Pulses: Intact distal pulses.     Heart sounds: Normal heart sounds. No murmur heard.  No friction rub. No gallop.   Pulmonary:     Effort: Pulmonary effort is normal. No respiratory distress.     Breath sounds: Normal breath sounds. No wheezing or rales.  Chest:     Chest wall: No tenderness.  Abdominal:     General: Bowel sounds are normal. There is no distension.     Palpations: Abdomen is soft. There is no mass.     Tenderness: There is no abdominal tenderness. There is no guarding or rebound.  Musculoskeletal:        General: No tenderness or edema. Normal range of motion.     Cervical back: Normal range of motion.   Lymphadenopathy:     Cervical: No cervical adenopathy.  Skin:    General: Skin is warm and dry.     Findings: No rash.  Neurological:     Mental Status: He is alert and oriented to person, place, and time.     Cranial Nerves: No cranial nerve deficit.     Motor: Weakness present. No abnormal muscle tone.     Coordination: He displays a negative Romberg sign. Coordination abnormal.     Gait: Gait abnormal.     Deep Tendon Reflexes: Reflexes are normal and symmetric.  Psychiatric:        Mood and Affect: Mood and affect normal.        Behavior: Behavior normal.        Thought Content: Thought content normal.        Judgment: Judgment normal.   aphasic  In a w/c R hemiparesis  Pressure sores pictures reviewed    A total time of >45 minutes was spent preparing to see the patient, reviewing tests, x-rays, operative reports and outside records from [place].  Also, obtaining history and performing comprehensive physical exam.  Additionally, counseling the patient regarding CVA, HTN.   Finally, documenting clinical information in the health records, coordination of care, educating the patient. It is a complex case.   Lab Results  Component Value Date   WBC 8.3 03/30/2020   HGB 15.8 03/30/2020   HCT 47.0 03/30/2020   PLT 122.0 (L) 03/30/2020   GLUCOSE 138 (H) 03/30/2020   CHOL 189 01/02/2020   TRIG 125 01/02/2020   HDL 43 01/02/2020   LDLDIRECT 136.2 08/01/2007   LDLCALC 122 (H) 01/02/2020   ALT 13 03/30/2020   AST 15 03/30/2020   NA 141 03/30/2020   K 4.0 03/30/2020   CL 104 03/30/2020   CREATININE 1.23 03/30/2020   BUN 14 03/30/2020   CO2 30 03/30/2020   TSH 1.63 01/02/2020   PSA 4.88 (H) 05/20/2019   INR 1.2 09/06/2019   HGBA1C 5.9 (H) 09/04/2019    DG Chest 2 View  Result Date: 09/10/2019 CLINICAL DATA:  Fever EXAM: CHEST - 2 VIEW COMPARISON:  09/04/2019 FINDINGS: Prior CABG and aortic valve replacement. The heart size and mediastinal contours are stable. Low lung  volumes. Increased interstitial markings within the left lung base, slightly progressed from prior. No pneumothorax. IMPRESSION: Increased interstitial markings within the left lung base, slightly progressed from prior. Findings could represent atelectasis versus pneumonia. Electronically Signed   By: Davina Poke D.O.   On: 09/10/2019 16:33   DG Abd 1 View  Result Date: 09/10/2019 CLINICAL DATA:  Abdominal distension. EXAM: ABDOMEN - 1 VIEW COMPARISON:  None. FINDINGS: The bowel gas pattern is normal. Residual contrast is noted in the colon. No radio-opaque calculi or other significant radiographic abnormality are seen. IMPRESSION:  No evidence of bowel obstruction or ileus. Electronically Signed   By: Marijo Conception M.D.   On: 09/10/2019 16:32   CT HEAD WO CONTRAST  Result Date: 09/10/2019 CLINICAL DATA:  Follow-up left basal ganglia hemorrhage. Increased lethargy. EXAM: CT HEAD WITHOUT CONTRAST TECHNIQUE: Contiguous axial images were obtained from the base of the skull through the vertex without intravenous contrast. COMPARISON:  09/04/2019 FINDINGS: Brain: Slight enlargement of the left basal ganglia intraparenchymal hemorrhage, measuring 4.4 x 2.5 x 3.6 cm (volume = 21 cm^3) today compared with 16 cc volume 6 days ago. Additionally, there is intraventricular penetration, having breach the atrium of the left lateral ventricle, with a small amount of blood dependent in both occipital horns. Ventricular size remains stable however without hydrocephalus. Surrounding edema is slightly more pronounced. One could question if there has been extension of infarction more anteriorly within the basal ganglia and external capsule or if this relates to edema from the hemorrhage. Other small-vessel ischemic changes throughout brain appear stable. No new large vessel infarction. No extra-axial collection. Vascular: There is atherosclerotic calcification of the major vessels at the base of the brain. Skull: Negative  Sinuses/Orbits: Clear/normal Other: None IMPRESSION: Slight enlargement of the left basal ganglia intraparenchymal hemorrhage when compared to the study of 6 days ago. Previous volume was calculated at 16 cc. Today calculated at 21 cc. There is also intraventricular penetration with a small amount of blood dependent in the occipital horns of both lateral ventricles but no hydrocephalus. Slightly more surrounding edema as expected. Some edema extends anterior into the basal ganglia and external capsule region, which could either be extension of edema or slight extension of the infarction itself. These results will be called to the ordering clinician or representative by the Radiologist Assistant, and communication documented in the PACS or [employer]. Electronically Signed   By: Nelson Chimes M.D.   On: 09/10/2019 16:57    Assessment & Plan:    Walker Kehr, MD

## 2020-07-01 DIAGNOSIS — E119 Type 2 diabetes mellitus without complications: Secondary | ICD-10-CM | POA: Diagnosis not present

## 2020-07-01 DIAGNOSIS — I10 Essential (primary) hypertension: Secondary | ICD-10-CM | POA: Diagnosis not present

## 2020-07-01 DIAGNOSIS — L89322 Pressure ulcer of left buttock, stage 2: Secondary | ICD-10-CM | POA: Diagnosis not present

## 2020-07-01 DIAGNOSIS — I69391 Dysphagia following cerebral infarction: Secondary | ICD-10-CM | POA: Diagnosis not present

## 2020-07-01 DIAGNOSIS — I69351 Hemiplegia and hemiparesis following cerebral infarction affecting right dominant side: Secondary | ICD-10-CM | POA: Diagnosis not present

## 2020-07-01 DIAGNOSIS — R1310 Dysphagia, unspecified: Secondary | ICD-10-CM | POA: Diagnosis not present

## 2020-07-03 DIAGNOSIS — E119 Type 2 diabetes mellitus without complications: Secondary | ICD-10-CM | POA: Diagnosis not present

## 2020-07-03 DIAGNOSIS — I69391 Dysphagia following cerebral infarction: Secondary | ICD-10-CM | POA: Diagnosis not present

## 2020-07-03 DIAGNOSIS — R1310 Dysphagia, unspecified: Secondary | ICD-10-CM | POA: Diagnosis not present

## 2020-07-03 DIAGNOSIS — I10 Essential (primary) hypertension: Secondary | ICD-10-CM | POA: Diagnosis not present

## 2020-07-03 DIAGNOSIS — L89322 Pressure ulcer of left buttock, stage 2: Secondary | ICD-10-CM | POA: Diagnosis not present

## 2020-07-03 DIAGNOSIS — I69351 Hemiplegia and hemiparesis following cerebral infarction affecting right dominant side: Secondary | ICD-10-CM | POA: Diagnosis not present

## 2020-07-06 DIAGNOSIS — L89322 Pressure ulcer of left buttock, stage 2: Secondary | ICD-10-CM | POA: Diagnosis not present

## 2020-07-06 DIAGNOSIS — I69391 Dysphagia following cerebral infarction: Secondary | ICD-10-CM | POA: Diagnosis not present

## 2020-07-06 DIAGNOSIS — I10 Essential (primary) hypertension: Secondary | ICD-10-CM | POA: Diagnosis not present

## 2020-07-06 DIAGNOSIS — I69351 Hemiplegia and hemiparesis following cerebral infarction affecting right dominant side: Secondary | ICD-10-CM | POA: Diagnosis not present

## 2020-07-06 DIAGNOSIS — R1310 Dysphagia, unspecified: Secondary | ICD-10-CM | POA: Diagnosis not present

## 2020-07-06 DIAGNOSIS — E119 Type 2 diabetes mellitus without complications: Secondary | ICD-10-CM | POA: Diagnosis not present

## 2020-07-09 DIAGNOSIS — E119 Type 2 diabetes mellitus without complications: Secondary | ICD-10-CM | POA: Diagnosis not present

## 2020-07-09 DIAGNOSIS — L89322 Pressure ulcer of left buttock, stage 2: Secondary | ICD-10-CM | POA: Diagnosis not present

## 2020-07-09 DIAGNOSIS — I10 Essential (primary) hypertension: Secondary | ICD-10-CM | POA: Diagnosis not present

## 2020-07-09 DIAGNOSIS — R1310 Dysphagia, unspecified: Secondary | ICD-10-CM | POA: Diagnosis not present

## 2020-07-09 DIAGNOSIS — I69391 Dysphagia following cerebral infarction: Secondary | ICD-10-CM | POA: Diagnosis not present

## 2020-07-09 DIAGNOSIS — I69351 Hemiplegia and hemiparesis following cerebral infarction affecting right dominant side: Secondary | ICD-10-CM | POA: Diagnosis not present

## 2020-07-10 DIAGNOSIS — I69351 Hemiplegia and hemiparesis following cerebral infarction affecting right dominant side: Secondary | ICD-10-CM | POA: Diagnosis not present

## 2020-07-10 DIAGNOSIS — I10 Essential (primary) hypertension: Secondary | ICD-10-CM | POA: Diagnosis not present

## 2020-07-10 DIAGNOSIS — R1310 Dysphagia, unspecified: Secondary | ICD-10-CM | POA: Diagnosis not present

## 2020-07-10 DIAGNOSIS — L89322 Pressure ulcer of left buttock, stage 2: Secondary | ICD-10-CM | POA: Diagnosis not present

## 2020-07-10 DIAGNOSIS — E119 Type 2 diabetes mellitus without complications: Secondary | ICD-10-CM | POA: Diagnosis not present

## 2020-07-10 DIAGNOSIS — I69391 Dysphagia following cerebral infarction: Secondary | ICD-10-CM | POA: Diagnosis not present

## 2020-07-13 DIAGNOSIS — I69351 Hemiplegia and hemiparesis following cerebral infarction affecting right dominant side: Secondary | ICD-10-CM | POA: Diagnosis not present

## 2020-07-13 DIAGNOSIS — I10 Essential (primary) hypertension: Secondary | ICD-10-CM | POA: Diagnosis not present

## 2020-07-13 DIAGNOSIS — L89322 Pressure ulcer of left buttock, stage 2: Secondary | ICD-10-CM | POA: Diagnosis not present

## 2020-07-13 DIAGNOSIS — E119 Type 2 diabetes mellitus without complications: Secondary | ICD-10-CM | POA: Diagnosis not present

## 2020-07-13 DIAGNOSIS — I69391 Dysphagia following cerebral infarction: Secondary | ICD-10-CM | POA: Diagnosis not present

## 2020-07-13 DIAGNOSIS — R1310 Dysphagia, unspecified: Secondary | ICD-10-CM | POA: Diagnosis not present

## 2020-07-15 DIAGNOSIS — I69391 Dysphagia following cerebral infarction: Secondary | ICD-10-CM | POA: Diagnosis not present

## 2020-07-15 DIAGNOSIS — I10 Essential (primary) hypertension: Secondary | ICD-10-CM | POA: Diagnosis not present

## 2020-07-15 DIAGNOSIS — I69351 Hemiplegia and hemiparesis following cerebral infarction affecting right dominant side: Secondary | ICD-10-CM | POA: Diagnosis not present

## 2020-07-15 DIAGNOSIS — L89322 Pressure ulcer of left buttock, stage 2: Secondary | ICD-10-CM | POA: Diagnosis not present

## 2020-07-15 DIAGNOSIS — E119 Type 2 diabetes mellitus without complications: Secondary | ICD-10-CM | POA: Diagnosis not present

## 2020-07-15 DIAGNOSIS — R1310 Dysphagia, unspecified: Secondary | ICD-10-CM | POA: Diagnosis not present

## 2020-07-17 DIAGNOSIS — I69391 Dysphagia following cerebral infarction: Secondary | ICD-10-CM | POA: Diagnosis not present

## 2020-07-17 DIAGNOSIS — E119 Type 2 diabetes mellitus without complications: Secondary | ICD-10-CM | POA: Diagnosis not present

## 2020-07-17 DIAGNOSIS — R1310 Dysphagia, unspecified: Secondary | ICD-10-CM | POA: Diagnosis not present

## 2020-07-17 DIAGNOSIS — I69351 Hemiplegia and hemiparesis following cerebral infarction affecting right dominant side: Secondary | ICD-10-CM | POA: Diagnosis not present

## 2020-07-17 DIAGNOSIS — L89322 Pressure ulcer of left buttock, stage 2: Secondary | ICD-10-CM | POA: Diagnosis not present

## 2020-07-17 DIAGNOSIS — I10 Essential (primary) hypertension: Secondary | ICD-10-CM | POA: Diagnosis not present

## 2020-07-22 DIAGNOSIS — E119 Type 2 diabetes mellitus without complications: Secondary | ICD-10-CM | POA: Diagnosis not present

## 2020-07-22 DIAGNOSIS — I69391 Dysphagia following cerebral infarction: Secondary | ICD-10-CM | POA: Diagnosis not present

## 2020-07-22 DIAGNOSIS — I69351 Hemiplegia and hemiparesis following cerebral infarction affecting right dominant side: Secondary | ICD-10-CM | POA: Diagnosis not present

## 2020-07-22 DIAGNOSIS — L89322 Pressure ulcer of left buttock, stage 2: Secondary | ICD-10-CM | POA: Diagnosis not present

## 2020-07-22 DIAGNOSIS — I10 Essential (primary) hypertension: Secondary | ICD-10-CM | POA: Diagnosis not present

## 2020-07-22 DIAGNOSIS — R1310 Dysphagia, unspecified: Secondary | ICD-10-CM | POA: Diagnosis not present

## 2020-07-23 ENCOUNTER — Telehealth: Payer: Self-pay | Admitting: Internal Medicine

## 2020-07-23 DIAGNOSIS — D696 Thrombocytopenia, unspecified: Secondary | ICD-10-CM | POA: Diagnosis not present

## 2020-07-23 DIAGNOSIS — H8110 Benign paroxysmal vertigo, unspecified ear: Secondary | ICD-10-CM

## 2020-07-23 DIAGNOSIS — D485 Neoplasm of uncertain behavior of skin: Secondary | ICD-10-CM | POA: Diagnosis not present

## 2020-07-23 DIAGNOSIS — I69351 Hemiplegia and hemiparesis following cerebral infarction affecting right dominant side: Secondary | ICD-10-CM | POA: Diagnosis not present

## 2020-07-23 DIAGNOSIS — Z87891 Personal history of nicotine dependence: Secondary | ICD-10-CM

## 2020-07-23 DIAGNOSIS — R1310 Dysphagia, unspecified: Secondary | ICD-10-CM | POA: Diagnosis not present

## 2020-07-23 DIAGNOSIS — F32A Depression, unspecified: Secondary | ICD-10-CM | POA: Diagnosis not present

## 2020-07-23 DIAGNOSIS — E119 Type 2 diabetes mellitus without complications: Secondary | ICD-10-CM | POA: Diagnosis not present

## 2020-07-23 DIAGNOSIS — M1990 Unspecified osteoarthritis, unspecified site: Secondary | ICD-10-CM

## 2020-07-23 DIAGNOSIS — Z9181 History of falling: Secondary | ICD-10-CM

## 2020-07-23 DIAGNOSIS — I2510 Atherosclerotic heart disease of native coronary artery without angina pectoris: Secondary | ICD-10-CM | POA: Diagnosis not present

## 2020-07-23 DIAGNOSIS — F0789 Other personality and behavioral disorders due to known physiological condition: Secondary | ICD-10-CM

## 2020-07-23 DIAGNOSIS — I10 Essential (primary) hypertension: Secondary | ICD-10-CM | POA: Diagnosis not present

## 2020-07-23 DIAGNOSIS — L89322 Pressure ulcer of left buttock, stage 2: Secondary | ICD-10-CM | POA: Diagnosis not present

## 2020-07-23 DIAGNOSIS — M545 Low back pain, unspecified: Secondary | ICD-10-CM

## 2020-07-23 DIAGNOSIS — E785 Hyperlipidemia, unspecified: Secondary | ICD-10-CM | POA: Diagnosis not present

## 2020-07-23 DIAGNOSIS — Z952 Presence of prosthetic heart valve: Secondary | ICD-10-CM

## 2020-07-23 DIAGNOSIS — I69391 Dysphagia following cerebral infarction: Secondary | ICD-10-CM | POA: Diagnosis not present

## 2020-07-23 DIAGNOSIS — J449 Chronic obstructive pulmonary disease, unspecified: Secondary | ICD-10-CM | POA: Diagnosis not present

## 2020-07-23 NOTE — Telephone Encounter (Signed)
    [place] Name: [place] Name:[facility] [place] Phone #: [phone] Service Requested:OT Frequency of Visits: [phone]

## 2020-07-24 DIAGNOSIS — I69351 Hemiplegia and hemiparesis following cerebral infarction affecting right dominant side: Secondary | ICD-10-CM | POA: Diagnosis not present

## 2020-07-24 DIAGNOSIS — R1310 Dysphagia, unspecified: Secondary | ICD-10-CM | POA: Diagnosis not present

## 2020-07-24 DIAGNOSIS — E119 Type 2 diabetes mellitus without complications: Secondary | ICD-10-CM | POA: Diagnosis not present

## 2020-07-24 DIAGNOSIS — I69391 Dysphagia following cerebral infarction: Secondary | ICD-10-CM | POA: Diagnosis not present

## 2020-07-24 DIAGNOSIS — L89322 Pressure ulcer of left buttock, stage 2: Secondary | ICD-10-CM | POA: Diagnosis not present

## 2020-07-24 DIAGNOSIS — I10 Essential (primary) hypertension: Secondary | ICD-10-CM | POA: Diagnosis not present

## 2020-07-24 NOTE — Telephone Encounter (Signed)
MD is out of the office today, but pt is up to date with standing ov. Called [name] gave verbal for OT...David [name]

## 2020-07-25 DIAGNOSIS — D485 Neoplasm of uncertain behavior of skin: Secondary | ICD-10-CM | POA: Diagnosis not present

## 2020-07-25 DIAGNOSIS — F32A Depression, unspecified: Secondary | ICD-10-CM | POA: Diagnosis not present

## 2020-07-25 DIAGNOSIS — I69351 Hemiplegia and hemiparesis following cerebral infarction affecting right dominant side: Secondary | ICD-10-CM | POA: Diagnosis not present

## 2020-07-25 DIAGNOSIS — M545 Low back pain, unspecified: Secondary | ICD-10-CM | POA: Diagnosis not present

## 2020-07-25 DIAGNOSIS — I10 Essential (primary) hypertension: Secondary | ICD-10-CM | POA: Diagnosis not present

## 2020-07-25 DIAGNOSIS — R1310 Dysphagia, unspecified: Secondary | ICD-10-CM | POA: Diagnosis not present

## 2020-07-25 DIAGNOSIS — E785 Hyperlipidemia, unspecified: Secondary | ICD-10-CM | POA: Diagnosis not present

## 2020-07-25 DIAGNOSIS — F0789 Other personality and behavioral disorders due to known physiological condition: Secondary | ICD-10-CM | POA: Diagnosis not present

## 2020-07-25 DIAGNOSIS — I69391 Dysphagia following cerebral infarction: Secondary | ICD-10-CM | POA: Diagnosis not present

## 2020-07-25 DIAGNOSIS — Z9181 History of falling: Secondary | ICD-10-CM | POA: Diagnosis not present

## 2020-07-25 DIAGNOSIS — I2510 Atherosclerotic heart disease of native coronary artery without angina pectoris: Secondary | ICD-10-CM | POA: Diagnosis not present

## 2020-07-25 DIAGNOSIS — H8110 Benign paroxysmal vertigo, unspecified ear: Secondary | ICD-10-CM | POA: Diagnosis not present

## 2020-07-25 DIAGNOSIS — Z87891 Personal history of nicotine dependence: Secondary | ICD-10-CM | POA: Diagnosis not present

## 2020-07-25 DIAGNOSIS — E119 Type 2 diabetes mellitus without complications: Secondary | ICD-10-CM | POA: Diagnosis not present

## 2020-07-25 DIAGNOSIS — J449 Chronic obstructive pulmonary disease, unspecified: Secondary | ICD-10-CM | POA: Diagnosis not present

## 2020-07-25 DIAGNOSIS — M1990 Unspecified osteoarthritis, unspecified site: Secondary | ICD-10-CM | POA: Diagnosis not present

## 2020-07-25 DIAGNOSIS — Z952 Presence of prosthetic heart valve: Secondary | ICD-10-CM | POA: Diagnosis not present

## 2020-07-25 DIAGNOSIS — L89322 Pressure ulcer of left buttock, stage 2: Secondary | ICD-10-CM | POA: Diagnosis not present

## 2020-07-25 DIAGNOSIS — D696 Thrombocytopenia, unspecified: Secondary | ICD-10-CM | POA: Diagnosis not present

## 2020-07-27 DIAGNOSIS — R1310 Dysphagia, unspecified: Secondary | ICD-10-CM | POA: Diagnosis not present

## 2020-07-27 DIAGNOSIS — E119 Type 2 diabetes mellitus without complications: Secondary | ICD-10-CM | POA: Diagnosis not present

## 2020-07-27 DIAGNOSIS — I69391 Dysphagia following cerebral infarction: Secondary | ICD-10-CM | POA: Diagnosis not present

## 2020-07-27 DIAGNOSIS — I10 Essential (primary) hypertension: Secondary | ICD-10-CM | POA: Diagnosis not present

## 2020-07-27 DIAGNOSIS — I69351 Hemiplegia and hemiparesis following cerebral infarction affecting right dominant side: Secondary | ICD-10-CM | POA: Diagnosis not present

## 2020-07-27 DIAGNOSIS — L89322 Pressure ulcer of left buttock, stage 2: Secondary | ICD-10-CM | POA: Diagnosis not present

## 2020-07-29 ENCOUNTER — Telehealth: Payer: Self-pay | Admitting: Internal Medicine

## 2020-07-29 NOTE — Telephone Encounter (Signed)
Called [name] there was no answer LMOM ok for verbal. Pt is in good standing w/appts...David [name]

## 2020-07-29 NOTE — Telephone Encounter (Signed)
[employer] Name: [place] Agency Name: [facility] [place] Phone #: [phone] Service Requested: PT Frequency of Visits: 2W2, [phone]

## 2020-07-31 DIAGNOSIS — I69351 Hemiplegia and hemiparesis following cerebral infarction affecting right dominant side: Secondary | ICD-10-CM | POA: Diagnosis not present

## 2020-07-31 DIAGNOSIS — L89322 Pressure ulcer of left buttock, stage 2: Secondary | ICD-10-CM | POA: Diagnosis not present

## 2020-07-31 DIAGNOSIS — E119 Type 2 diabetes mellitus without complications: Secondary | ICD-10-CM | POA: Diagnosis not present

## 2020-07-31 DIAGNOSIS — I10 Essential (primary) hypertension: Secondary | ICD-10-CM | POA: Diagnosis not present

## 2020-07-31 DIAGNOSIS — I69391 Dysphagia following cerebral infarction: Secondary | ICD-10-CM | POA: Diagnosis not present

## 2020-07-31 DIAGNOSIS — R1310 Dysphagia, unspecified: Secondary | ICD-10-CM | POA: Diagnosis not present

## 2020-08-03 DIAGNOSIS — I10 Essential (primary) hypertension: Secondary | ICD-10-CM | POA: Diagnosis not present

## 2020-08-03 DIAGNOSIS — L89322 Pressure ulcer of left buttock, stage 2: Secondary | ICD-10-CM | POA: Diagnosis not present

## 2020-08-03 DIAGNOSIS — I69391 Dysphagia following cerebral infarction: Secondary | ICD-10-CM | POA: Diagnosis not present

## 2020-08-03 DIAGNOSIS — I69351 Hemiplegia and hemiparesis following cerebral infarction affecting right dominant side: Secondary | ICD-10-CM | POA: Diagnosis not present

## 2020-08-03 DIAGNOSIS — E119 Type 2 diabetes mellitus without complications: Secondary | ICD-10-CM | POA: Diagnosis not present

## 2020-08-03 DIAGNOSIS — R1310 Dysphagia, unspecified: Secondary | ICD-10-CM | POA: Diagnosis not present

## 2020-08-04 DIAGNOSIS — I69351 Hemiplegia and hemiparesis following cerebral infarction affecting right dominant side: Secondary | ICD-10-CM | POA: Diagnosis not present

## 2020-08-04 DIAGNOSIS — R1310 Dysphagia, unspecified: Secondary | ICD-10-CM | POA: Diagnosis not present

## 2020-08-04 DIAGNOSIS — E119 Type 2 diabetes mellitus without complications: Secondary | ICD-10-CM | POA: Diagnosis not present

## 2020-08-04 DIAGNOSIS — I10 Essential (primary) hypertension: Secondary | ICD-10-CM | POA: Diagnosis not present

## 2020-08-04 DIAGNOSIS — L89322 Pressure ulcer of left buttock, stage 2: Secondary | ICD-10-CM | POA: Diagnosis not present

## 2020-08-04 DIAGNOSIS — I69391 Dysphagia following cerebral infarction: Secondary | ICD-10-CM | POA: Diagnosis not present

## 2020-08-06 DIAGNOSIS — I69391 Dysphagia following cerebral infarction: Secondary | ICD-10-CM | POA: Diagnosis not present

## 2020-08-06 DIAGNOSIS — L89322 Pressure ulcer of left buttock, stage 2: Secondary | ICD-10-CM | POA: Diagnosis not present

## 2020-08-06 DIAGNOSIS — E119 Type 2 diabetes mellitus without complications: Secondary | ICD-10-CM | POA: Diagnosis not present

## 2020-08-06 DIAGNOSIS — R1310 Dysphagia, unspecified: Secondary | ICD-10-CM | POA: Diagnosis not present

## 2020-08-06 DIAGNOSIS — I10 Essential (primary) hypertension: Secondary | ICD-10-CM | POA: Diagnosis not present

## 2020-08-06 DIAGNOSIS — I69351 Hemiplegia and hemiparesis following cerebral infarction affecting right dominant side: Secondary | ICD-10-CM | POA: Diagnosis not present

## 2020-08-07 DIAGNOSIS — R1310 Dysphagia, unspecified: Secondary | ICD-10-CM | POA: Diagnosis not present

## 2020-08-07 DIAGNOSIS — I69351 Hemiplegia and hemiparesis following cerebral infarction affecting right dominant side: Secondary | ICD-10-CM | POA: Diagnosis not present

## 2020-08-07 DIAGNOSIS — I69391 Dysphagia following cerebral infarction: Secondary | ICD-10-CM | POA: Diagnosis not present

## 2020-08-07 DIAGNOSIS — E119 Type 2 diabetes mellitus without complications: Secondary | ICD-10-CM | POA: Diagnosis not present

## 2020-08-07 DIAGNOSIS — I10 Essential (primary) hypertension: Secondary | ICD-10-CM | POA: Diagnosis not present

## 2020-08-07 DIAGNOSIS — L89322 Pressure ulcer of left buttock, stage 2: Secondary | ICD-10-CM | POA: Diagnosis not present

## 2020-08-11 DIAGNOSIS — E119 Type 2 diabetes mellitus without complications: Secondary | ICD-10-CM | POA: Diagnosis not present

## 2020-08-11 DIAGNOSIS — I10 Essential (primary) hypertension: Secondary | ICD-10-CM | POA: Diagnosis not present

## 2020-08-11 DIAGNOSIS — I69351 Hemiplegia and hemiparesis following cerebral infarction affecting right dominant side: Secondary | ICD-10-CM | POA: Diagnosis not present

## 2020-08-11 DIAGNOSIS — I69391 Dysphagia following cerebral infarction: Secondary | ICD-10-CM | POA: Diagnosis not present

## 2020-08-11 DIAGNOSIS — L89322 Pressure ulcer of left buttock, stage 2: Secondary | ICD-10-CM | POA: Diagnosis not present

## 2020-08-11 DIAGNOSIS — R1310 Dysphagia, unspecified: Secondary | ICD-10-CM | POA: Diagnosis not present

## 2020-08-13 DIAGNOSIS — E119 Type 2 diabetes mellitus without complications: Secondary | ICD-10-CM | POA: Diagnosis not present

## 2020-08-13 DIAGNOSIS — I69351 Hemiplegia and hemiparesis following cerebral infarction affecting right dominant side: Secondary | ICD-10-CM | POA: Diagnosis not present

## 2020-08-13 DIAGNOSIS — L89322 Pressure ulcer of left buttock, stage 2: Secondary | ICD-10-CM | POA: Diagnosis not present

## 2020-08-13 DIAGNOSIS — I69391 Dysphagia following cerebral infarction: Secondary | ICD-10-CM | POA: Diagnosis not present

## 2020-08-13 DIAGNOSIS — R1310 Dysphagia, unspecified: Secondary | ICD-10-CM | POA: Diagnosis not present

## 2020-08-13 DIAGNOSIS — I10 Essential (primary) hypertension: Secondary | ICD-10-CM | POA: Diagnosis not present

## 2020-08-14 DIAGNOSIS — L89322 Pressure ulcer of left buttock, stage 2: Secondary | ICD-10-CM | POA: Diagnosis not present

## 2020-08-14 DIAGNOSIS — E119 Type 2 diabetes mellitus without complications: Secondary | ICD-10-CM | POA: Diagnosis not present

## 2020-08-14 DIAGNOSIS — I69351 Hemiplegia and hemiparesis following cerebral infarction affecting right dominant side: Secondary | ICD-10-CM | POA: Diagnosis not present

## 2020-08-14 DIAGNOSIS — I69391 Dysphagia following cerebral infarction: Secondary | ICD-10-CM | POA: Diagnosis not present

## 2020-08-14 DIAGNOSIS — I10 Essential (primary) hypertension: Secondary | ICD-10-CM | POA: Diagnosis not present

## 2020-08-14 DIAGNOSIS — R1310 Dysphagia, unspecified: Secondary | ICD-10-CM | POA: Diagnosis not present

## 2020-08-19 ENCOUNTER — Encounter: Payer: Self-pay | Admitting: Physical Medicine & Rehabilitation

## 2020-08-19 ENCOUNTER — Encounter: Payer: Medicare Other | Attending: Physical Medicine & Rehabilitation | Admitting: Physical Medicine & Rehabilitation

## 2020-08-19 ENCOUNTER — Other Ambulatory Visit: Payer: Self-pay

## 2020-08-19 VITALS — BP 108/66 | HR 61 | Temp 97.7°F

## 2020-08-19 DIAGNOSIS — I639 Cerebral infarction, unspecified: Secondary | ICD-10-CM | POA: Diagnosis not present

## 2020-08-19 DIAGNOSIS — G8111 Spastic hemiplegia affecting right dominant side: Secondary | ICD-10-CM | POA: Diagnosis not present

## 2020-08-19 DIAGNOSIS — M7501 Adhesive capsulitis of right shoulder: Secondary | ICD-10-CM | POA: Diagnosis not present

## 2020-08-19 DIAGNOSIS — I69922 Dysarthria following unspecified cerebrovascular disease: Secondary | ICD-10-CM | POA: Insufficient documentation

## 2020-08-19 NOTE — Progress Notes (Signed)
Subjective:    Patient ID: David [name], male    DOB: 1924-01-31, 87 y.o.   MRN: [id number]  HPI   David [name] is here in follow-up of his left basal ganglia hemorrhage.  At his last visit in December we performed Botox injections to his right pectoralis major minor as well as biceps and brachioradialis muscles.he had good results with these. He has been working with therapy on rom and mobility. He has received a right AFO and has been ambulating with therapy short dx within the home.  His has helped better control his right heel which began to become a bit tight especially with gait.  He has been using a quad based cane and walker. He has been eating well.  Mood has been more upbeat and his daughter notes that his personality is improving.  He remains on amantadine100 milligrams twice daily.  He has been more alert.  Sleep patterns have been normal.  He has had some issues with some skin breakdown near his coccyx/sacral area that has been followed by home nursing.  He is required dressings off and on   Pain Inventory Average Pain 4 Pain Right Now 4 My pain is aching  LOCATION OF PAIN  Elbow, wrist, knee, leg, ankle  BOWEL Number of stools per week: 6  Oral laxative use Yes  Type of laxative miralax Enema or suppository use No  History of colostomy No  Incontinent No   BLADDER Pads In and out cath, frequency na Able to self cath na Bladder incontinence No  Frequent urination No  Leakage with coughing No  Difficulty starting stream No  Incomplete bladder emptying No    Mobility walk with assistance use a cane use a walker ability to climb steps?  no do you drive?  no use a wheelchair needs help with transfers  Function retired  Neuro/Psych numbness trouble walking  Prior Studies Any changes since last visit?  no  Physicians involved in your care Any changes since last visit?  no   Family History  Problem Relation Age of Onset  . Coronary artery disease  Other   . Hypertension Other    Social History   Socioeconomic History  . Marital status: Widowed    Spouse name: Not on file  . Number of children: Not on file  . Years of education: Not on file  . Highest education level: Not on file  Occupational History  . Not on file  Tobacco Use  . Smoking status: Former Smoker    Types: Pipe    Quit date: 05/16/1998    Years since quitting: 22.2  . Smokeless tobacco: Former Network engineer  . Vaping Use: Never used  Substance and Sexual Activity  . Alcohol use: No  . Drug use: No  . Sexual activity: Never  Other Topics Concern  . Not on file  Social History Narrative  . Not on file   Social Determinants of Health   Financial Resource Strain: Low Risk   . Difficulty of Paying Living Expenses: Not hard at all  Food Insecurity: No Food Insecurity  . Worried About Charity fundraiser in the Last Year: Never true  . Ran Out of Food in the Last Year: Never true  Transportation Needs: No Transportation Needs  . Lack of Transportation (Medical): No  . Lack of Transportation (Non-Medical): No  Physical Activity: Sufficiently Active  . Days of Exercise per Week: 3 days  . Minutes of Exercise  per Session: 50 min  Stress: No Stress Concern Present  . Feeling of Stress : Not at all  Social Connections: Socially Isolated  . Frequency of Communication with Friends and Family: More than three times a week  . Frequency of Social Gatherings with Friends and Family: Once a week  . Attends Religious Services: Never  . Active Member of Clubs or Organizations: No  . Attends Archivist Meetings: Never  . Marital Status: Widowed   Past Surgical History:  Procedure Laterality Date  . 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 CARDIAC CATHETERIZATION  10/05/1998   Recommend CABG revascularizaition  . CARDIAC CATHETERIZATION  06/11/1999   Continue medical therapy  . CARDIAC CATHETERIZATION  01/08/2007   Patent grafts, recommend  medical therapy  . CARDIOVASCULAR STRESS TEST  04/22/2010   Small lateral defect which is partially reversible, no ischemic EKG changes were noted.  Marland Kitchen CAROTID DOPPLER  04/22/2010   Bilateral ICAs-no evidence of diametere reduction, significant tortuosity, or any other vascular abnormality.  . CORONARY ARTERY BYPASS GRAFT  10/09/1998   LIMA to LAD, SVG to diag, SVG sequential to 2 marginals, SVG sequential to PDA & PLA. CE#25 porcine AVR.  Marland Kitchen TRANSTHORACIC ECHOCARDIOGRAM  05/14/2012   EF 50-55%, bioprosthesis of the aortic valve with well preserved LV function, moderate concentric [place]   Past Medical History:  Diagnosis Date  . CAD (coronary artery disease)    intolerance of statins  . COPD (chronic obstructive pulmonary disease) ([place])   . Current use of long term anticoagulation   . GERD (gastroesophageal reflux disease)   . Hyperlipidemia   . Hypertension   . Impaired glucose tolerance 02/14/2011  . LBP (low back pain)   . OA (osteoarthritis)   . 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   BP 108/66   Pulse 61   Temp 97.7 F (36.5 C)   SpO2 97%   Opioid Risk Score:   Fall Risk Score:  `1  Depression screen PHQ 2/9  Depression screen [facility] 2/9 06/29/2020 06/05/2020 02/19/2020 10/30/2019 05/20/2019 12/04/2017 12/02/2016  Decreased Interest 0 0 0 0 0 0 0  Down, Depressed, Hopeless 0 0 0 1 0 0 0  PHQ - 2 Score 0 0 0 1 0 0 0  Altered sleeping 0 - - 1 - - -  Tired, decreased energy 0 - - 0 - - -  Change in appetite 0 - - 1 - - -  Feeling bad or failure about yourself  0 - - 1 - - -  Trouble concentrating 0 - - 3 - - -  Moving slowly or fidgety/restless 0 - - 1 - - -  Suicidal thoughts 0 - - 0 - - -  PHQ-9 Score 0 - - 8 - - -  Difficult doing work/chores - - - Not difficult at all - - -    Review of Systems  Musculoskeletal: Positive for joint swelling.       Leg pain  All other systems reviewed and are negative.      Objective:   Physical Exam Gen: no distress,  normal appearing HEENT: oral mucosa pink and moist, NCAT Cardio: Reg rate Chest: normal effort, normal rate of breathing Abd: soft, non-distended Ext: no edema Psych: pleasant, normal affect Skin: sacral wound not visualized Neuro: very alert. dysarthric, word finding deficits, perseverative and has a hard time changing tasks. Motor trace shoulder/pec as well as wrist and fingers RUE. LUE  5/5. RLE 1+ HF, KE and trace ADF/PF. LLE 4/5. Sensation intact to LT/Pain on right.  Tone in the right upper extremity is trace to 1 out of 4.  Right lower extremity extensor tone is 1 to 1+ out of 4 at best at the gastrocs and tibialis posterior.  Hamstring tone is 1 out of 4 on the right. Musculoskeletal: right shoulder tight at jt capsule.        Assessment & Plan:  1.  Functional, mobility and cognitive deficits secondary to left basal ganglia hemorrhage             -HHPT/OT for gait, ROM. Continue as long as he's making gains  -AFO RLE for stability  -consider transition to outpt therapies when he's more easily able to get in and out of the car 2. Chronic LBP/Pain Management:   -tylenol 3. Mood:               -lexapro--10 mg daily             -Mood much improved, sleeping well 4.  Spastic right hemiparesis: good results with botox in the RUE/RLE in August and again in December with reduced spasms and increased volitional movement.  -At this point he does not need further Botox.  Could consider Botox to his right lower extremity if he does not develop increased extensor tone at the ankle.  I asked his daughter to call me if this does not occur  5. Arousal:             -continue amantadine 100mg  daily for one week then stop                       6. Skin/Wound Care:  Continue local measures for home health nursing.  7. RIght adhesive capsulitis  -pain seems better  -ROM improved with botox 8. HTN:  Blood pressure controlled     9. H/o COPD: Stable without meds.   10. CAD/AVR: Off ASA due to  bleed. No statin due to intolerance.              -no TEE was indicated  12. Constipation:  regular BM's per daughter             -Continue senokot-s at bedtime adjust as needed             `-  MiraLAX daily  Fifteen minutes of face to face patient care time were spent during this visit. All questions were encouraged and answered.  Follow up with me in 4 mos .

## 2020-08-19 NOTE — Patient Instructions (Signed)
CONTINUE HOME THERAPIES. WHEN CAR TRANSFERS BECOME EASIER, WE COULD LOOK AT OUTPATIENT THERAPIES

## 2020-08-20 ENCOUNTER — Telehealth: Payer: Self-pay | Admitting: Internal Medicine

## 2020-08-20 DIAGNOSIS — R1310 Dysphagia, unspecified: Secondary | ICD-10-CM | POA: Diagnosis not present

## 2020-08-20 DIAGNOSIS — I69351 Hemiplegia and hemiparesis following cerebral infarction affecting right dominant side: Secondary | ICD-10-CM | POA: Diagnosis not present

## 2020-08-20 DIAGNOSIS — L89322 Pressure ulcer of left buttock, stage 2: Secondary | ICD-10-CM | POA: Diagnosis not present

## 2020-08-20 DIAGNOSIS — E119 Type 2 diabetes mellitus without complications: Secondary | ICD-10-CM | POA: Diagnosis not present

## 2020-08-20 DIAGNOSIS — I10 Essential (primary) hypertension: Secondary | ICD-10-CM | POA: Diagnosis not present

## 2020-08-20 DIAGNOSIS — I69391 Dysphagia following cerebral infarction: Secondary | ICD-10-CM | POA: Diagnosis not present

## 2020-08-20 NOTE — Telephone Encounter (Signed)
Charri from [facility] home health calling, requesting verbal orders to continue home health PT for 1 week 1 and 2 week 3 Charri- [phone] Ok to lvm

## 2020-08-21 DIAGNOSIS — I10 Essential (primary) hypertension: Secondary | ICD-10-CM | POA: Diagnosis not present

## 2020-08-21 DIAGNOSIS — R1310 Dysphagia, unspecified: Secondary | ICD-10-CM | POA: Diagnosis not present

## 2020-08-21 DIAGNOSIS — E119 Type 2 diabetes mellitus without complications: Secondary | ICD-10-CM | POA: Diagnosis not present

## 2020-08-21 DIAGNOSIS — I69351 Hemiplegia and hemiparesis following cerebral infarction affecting right dominant side: Secondary | ICD-10-CM | POA: Diagnosis not present

## 2020-08-21 DIAGNOSIS — L89322 Pressure ulcer of left buttock, stage 2: Secondary | ICD-10-CM | POA: Diagnosis not present

## 2020-08-21 DIAGNOSIS — I69391 Dysphagia following cerebral infarction: Secondary | ICD-10-CM | POA: Diagnosis not present

## 2020-08-21 NOTE — Telephone Encounter (Signed)
Notified Charri w/MD response.Marland KitchenJohny Chess

## 2020-08-21 NOTE — Telephone Encounter (Signed)
Okay.  Thanks.

## 2020-08-22 DIAGNOSIS — R1310 Dysphagia, unspecified: Secondary | ICD-10-CM | POA: Diagnosis not present

## 2020-08-22 DIAGNOSIS — E119 Type 2 diabetes mellitus without complications: Secondary | ICD-10-CM | POA: Diagnosis not present

## 2020-08-22 DIAGNOSIS — I69351 Hemiplegia and hemiparesis following cerebral infarction affecting right dominant side: Secondary | ICD-10-CM | POA: Diagnosis not present

## 2020-08-22 DIAGNOSIS — L89322 Pressure ulcer of left buttock, stage 2: Secondary | ICD-10-CM | POA: Diagnosis not present

## 2020-08-22 DIAGNOSIS — I10 Essential (primary) hypertension: Secondary | ICD-10-CM | POA: Diagnosis not present

## 2020-08-22 DIAGNOSIS — I69391 Dysphagia following cerebral infarction: Secondary | ICD-10-CM | POA: Diagnosis not present

## 2020-08-24 ENCOUNTER — Telehealth: Payer: Self-pay | Admitting: Internal Medicine

## 2020-08-24 DIAGNOSIS — I69391 Dysphagia following cerebral infarction: Secondary | ICD-10-CM | POA: Diagnosis not present

## 2020-08-24 DIAGNOSIS — E785 Hyperlipidemia, unspecified: Secondary | ICD-10-CM | POA: Diagnosis not present

## 2020-08-24 DIAGNOSIS — E119 Type 2 diabetes mellitus without complications: Secondary | ICD-10-CM | POA: Diagnosis not present

## 2020-08-24 DIAGNOSIS — F32A Depression, unspecified: Secondary | ICD-10-CM | POA: Diagnosis not present

## 2020-08-24 DIAGNOSIS — Z952 Presence of prosthetic heart valve: Secondary | ICD-10-CM | POA: Diagnosis not present

## 2020-08-24 DIAGNOSIS — Z9181 History of falling: Secondary | ICD-10-CM | POA: Diagnosis not present

## 2020-08-24 DIAGNOSIS — I10 Essential (primary) hypertension: Secondary | ICD-10-CM | POA: Diagnosis not present

## 2020-08-24 DIAGNOSIS — H8110 Benign paroxysmal vertigo, unspecified ear: Secondary | ICD-10-CM | POA: Diagnosis not present

## 2020-08-24 DIAGNOSIS — I69351 Hemiplegia and hemiparesis following cerebral infarction affecting right dominant side: Secondary | ICD-10-CM | POA: Diagnosis not present

## 2020-08-24 DIAGNOSIS — D485 Neoplasm of uncertain behavior of skin: Secondary | ICD-10-CM | POA: Diagnosis not present

## 2020-08-24 DIAGNOSIS — J449 Chronic obstructive pulmonary disease, unspecified: Secondary | ICD-10-CM | POA: Diagnosis not present

## 2020-08-24 DIAGNOSIS — I2510 Atherosclerotic heart disease of native coronary artery without angina pectoris: Secondary | ICD-10-CM | POA: Diagnosis not present

## 2020-08-24 DIAGNOSIS — R1310 Dysphagia, unspecified: Secondary | ICD-10-CM | POA: Diagnosis not present

## 2020-08-24 DIAGNOSIS — M1990 Unspecified osteoarthritis, unspecified site: Secondary | ICD-10-CM | POA: Diagnosis not present

## 2020-08-24 DIAGNOSIS — Z87891 Personal history of nicotine dependence: Secondary | ICD-10-CM | POA: Diagnosis not present

## 2020-08-24 DIAGNOSIS — M545 Low back pain, unspecified: Secondary | ICD-10-CM | POA: Diagnosis not present

## 2020-08-24 DIAGNOSIS — D696 Thrombocytopenia, unspecified: Secondary | ICD-10-CM | POA: Diagnosis not present

## 2020-08-24 DIAGNOSIS — L89312 Pressure ulcer of right buttock, stage 2: Secondary | ICD-10-CM | POA: Diagnosis not present

## 2020-08-24 DIAGNOSIS — F0789 Other personality and behavioral disorders due to known physiological condition: Secondary | ICD-10-CM | POA: Diagnosis not present

## 2020-08-24 NOTE — Telephone Encounter (Signed)
Ok Thx 

## 2020-08-24 NOTE — Telephone Encounter (Signed)
Notified [name] w/MD response../lmb 

## 2020-08-24 NOTE — Telephone Encounter (Signed)
David [name] is requesting OT verbals for 1w6 starting today 08-24-20. Please advise.    Okay to LVM: [phone]

## 2020-08-29 DIAGNOSIS — I10 Essential (primary) hypertension: Secondary | ICD-10-CM | POA: Diagnosis not present

## 2020-08-29 DIAGNOSIS — I69391 Dysphagia following cerebral infarction: Secondary | ICD-10-CM | POA: Diagnosis not present

## 2020-08-29 DIAGNOSIS — E119 Type 2 diabetes mellitus without complications: Secondary | ICD-10-CM | POA: Diagnosis not present

## 2020-08-29 DIAGNOSIS — I2510 Atherosclerotic heart disease of native coronary artery without angina pectoris: Secondary | ICD-10-CM | POA: Diagnosis not present

## 2020-08-29 DIAGNOSIS — R1310 Dysphagia, unspecified: Secondary | ICD-10-CM | POA: Diagnosis not present

## 2020-08-29 DIAGNOSIS — I69351 Hemiplegia and hemiparesis following cerebral infarction affecting right dominant side: Secondary | ICD-10-CM | POA: Diagnosis not present

## 2020-08-29 DIAGNOSIS — L89312 Pressure ulcer of right buttock, stage 2: Secondary | ICD-10-CM | POA: Diagnosis not present

## 2020-09-01 DIAGNOSIS — L89312 Pressure ulcer of right buttock, stage 2: Secondary | ICD-10-CM | POA: Diagnosis not present

## 2020-09-01 DIAGNOSIS — I69351 Hemiplegia and hemiparesis following cerebral infarction affecting right dominant side: Secondary | ICD-10-CM | POA: Diagnosis not present

## 2020-09-01 DIAGNOSIS — E119 Type 2 diabetes mellitus without complications: Secondary | ICD-10-CM | POA: Diagnosis not present

## 2020-09-01 DIAGNOSIS — R1310 Dysphagia, unspecified: Secondary | ICD-10-CM | POA: Diagnosis not present

## 2020-09-01 DIAGNOSIS — I2510 Atherosclerotic heart disease of native coronary artery without angina pectoris: Secondary | ICD-10-CM | POA: Diagnosis not present

## 2020-09-01 DIAGNOSIS — I69391 Dysphagia following cerebral infarction: Secondary | ICD-10-CM | POA: Diagnosis not present

## 2020-09-01 DIAGNOSIS — I10 Essential (primary) hypertension: Secondary | ICD-10-CM | POA: Diagnosis not present

## 2020-09-02 DIAGNOSIS — I10 Essential (primary) hypertension: Secondary | ICD-10-CM | POA: Diagnosis not present

## 2020-09-02 DIAGNOSIS — I69351 Hemiplegia and hemiparesis following cerebral infarction affecting right dominant side: Secondary | ICD-10-CM | POA: Diagnosis not present

## 2020-09-02 DIAGNOSIS — R1310 Dysphagia, unspecified: Secondary | ICD-10-CM | POA: Diagnosis not present

## 2020-09-02 DIAGNOSIS — I2510 Atherosclerotic heart disease of native coronary artery without angina pectoris: Secondary | ICD-10-CM | POA: Diagnosis not present

## 2020-09-02 DIAGNOSIS — L89312 Pressure ulcer of right buttock, stage 2: Secondary | ICD-10-CM | POA: Diagnosis not present

## 2020-09-02 DIAGNOSIS — I69391 Dysphagia following cerebral infarction: Secondary | ICD-10-CM | POA: Diagnosis not present

## 2020-09-02 DIAGNOSIS — E119 Type 2 diabetes mellitus without complications: Secondary | ICD-10-CM | POA: Diagnosis not present

## 2020-09-07 DIAGNOSIS — I69351 Hemiplegia and hemiparesis following cerebral infarction affecting right dominant side: Secondary | ICD-10-CM | POA: Diagnosis not present

## 2020-09-07 DIAGNOSIS — I2510 Atherosclerotic heart disease of native coronary artery without angina pectoris: Secondary | ICD-10-CM | POA: Diagnosis not present

## 2020-09-07 DIAGNOSIS — I10 Essential (primary) hypertension: Secondary | ICD-10-CM | POA: Diagnosis not present

## 2020-09-07 DIAGNOSIS — E119 Type 2 diabetes mellitus without complications: Secondary | ICD-10-CM | POA: Diagnosis not present

## 2020-09-07 DIAGNOSIS — L89312 Pressure ulcer of right buttock, stage 2: Secondary | ICD-10-CM | POA: Diagnosis not present

## 2020-09-07 DIAGNOSIS — R1310 Dysphagia, unspecified: Secondary | ICD-10-CM | POA: Diagnosis not present

## 2020-09-07 DIAGNOSIS — I69391 Dysphagia following cerebral infarction: Secondary | ICD-10-CM | POA: Diagnosis not present

## 2020-09-10 DIAGNOSIS — L89312 Pressure ulcer of right buttock, stage 2: Secondary | ICD-10-CM | POA: Diagnosis not present

## 2020-09-10 DIAGNOSIS — E119 Type 2 diabetes mellitus without complications: Secondary | ICD-10-CM | POA: Diagnosis not present

## 2020-09-10 DIAGNOSIS — I69351 Hemiplegia and hemiparesis following cerebral infarction affecting right dominant side: Secondary | ICD-10-CM | POA: Diagnosis not present

## 2020-09-10 DIAGNOSIS — I69391 Dysphagia following cerebral infarction: Secondary | ICD-10-CM | POA: Diagnosis not present

## 2020-09-10 DIAGNOSIS — I2510 Atherosclerotic heart disease of native coronary artery without angina pectoris: Secondary | ICD-10-CM | POA: Diagnosis not present

## 2020-09-10 DIAGNOSIS — I10 Essential (primary) hypertension: Secondary | ICD-10-CM | POA: Diagnosis not present

## 2020-09-10 DIAGNOSIS — R1310 Dysphagia, unspecified: Secondary | ICD-10-CM | POA: Diagnosis not present

## 2020-09-11 DIAGNOSIS — I10 Essential (primary) hypertension: Secondary | ICD-10-CM | POA: Diagnosis not present

## 2020-09-11 DIAGNOSIS — E119 Type 2 diabetes mellitus without complications: Secondary | ICD-10-CM | POA: Diagnosis not present

## 2020-09-11 DIAGNOSIS — I2510 Atherosclerotic heart disease of native coronary artery without angina pectoris: Secondary | ICD-10-CM | POA: Diagnosis not present

## 2020-09-11 DIAGNOSIS — I69391 Dysphagia following cerebral infarction: Secondary | ICD-10-CM | POA: Diagnosis not present

## 2020-09-11 DIAGNOSIS — R1310 Dysphagia, unspecified: Secondary | ICD-10-CM | POA: Diagnosis not present

## 2020-09-11 DIAGNOSIS — I69351 Hemiplegia and hemiparesis following cerebral infarction affecting right dominant side: Secondary | ICD-10-CM | POA: Diagnosis not present

## 2020-09-11 DIAGNOSIS — L89312 Pressure ulcer of right buttock, stage 2: Secondary | ICD-10-CM | POA: Diagnosis not present

## 2020-09-14 DIAGNOSIS — I2510 Atherosclerotic heart disease of native coronary artery without angina pectoris: Secondary | ICD-10-CM | POA: Diagnosis not present

## 2020-09-14 DIAGNOSIS — I69351 Hemiplegia and hemiparesis following cerebral infarction affecting right dominant side: Secondary | ICD-10-CM | POA: Diagnosis not present

## 2020-09-14 DIAGNOSIS — R1310 Dysphagia, unspecified: Secondary | ICD-10-CM | POA: Diagnosis not present

## 2020-09-14 DIAGNOSIS — I69391 Dysphagia following cerebral infarction: Secondary | ICD-10-CM | POA: Diagnosis not present

## 2020-09-14 DIAGNOSIS — L89312 Pressure ulcer of right buttock, stage 2: Secondary | ICD-10-CM | POA: Diagnosis not present

## 2020-09-14 DIAGNOSIS — I10 Essential (primary) hypertension: Secondary | ICD-10-CM | POA: Diagnosis not present

## 2020-09-14 DIAGNOSIS — E119 Type 2 diabetes mellitus without complications: Secondary | ICD-10-CM | POA: Diagnosis not present

## 2020-09-16 DIAGNOSIS — I69391 Dysphagia following cerebral infarction: Secondary | ICD-10-CM | POA: Diagnosis not present

## 2020-09-16 DIAGNOSIS — I10 Essential (primary) hypertension: Secondary | ICD-10-CM | POA: Diagnosis not present

## 2020-09-16 DIAGNOSIS — I69351 Hemiplegia and hemiparesis following cerebral infarction affecting right dominant side: Secondary | ICD-10-CM | POA: Diagnosis not present

## 2020-09-16 DIAGNOSIS — I2510 Atherosclerotic heart disease of native coronary artery without angina pectoris: Secondary | ICD-10-CM | POA: Diagnosis not present

## 2020-09-16 DIAGNOSIS — E119 Type 2 diabetes mellitus without complications: Secondary | ICD-10-CM | POA: Diagnosis not present

## 2020-09-16 DIAGNOSIS — R1310 Dysphagia, unspecified: Secondary | ICD-10-CM | POA: Diagnosis not present

## 2020-09-16 DIAGNOSIS — L89312 Pressure ulcer of right buttock, stage 2: Secondary | ICD-10-CM | POA: Diagnosis not present

## 2020-09-17 DIAGNOSIS — M545 Low back pain, unspecified: Secondary | ICD-10-CM

## 2020-09-17 DIAGNOSIS — I69391 Dysphagia following cerebral infarction: Secondary | ICD-10-CM | POA: Diagnosis not present

## 2020-09-17 DIAGNOSIS — Z952 Presence of prosthetic heart valve: Secondary | ICD-10-CM

## 2020-09-17 DIAGNOSIS — J449 Chronic obstructive pulmonary disease, unspecified: Secondary | ICD-10-CM | POA: Diagnosis not present

## 2020-09-17 DIAGNOSIS — L89312 Pressure ulcer of right buttock, stage 2: Secondary | ICD-10-CM | POA: Diagnosis not present

## 2020-09-17 DIAGNOSIS — F0789 Other personality and behavioral disorders due to known physiological condition: Secondary | ICD-10-CM | POA: Diagnosis not present

## 2020-09-17 DIAGNOSIS — R1310 Dysphagia, unspecified: Secondary | ICD-10-CM | POA: Diagnosis not present

## 2020-09-17 DIAGNOSIS — Z87891 Personal history of nicotine dependence: Secondary | ICD-10-CM

## 2020-09-17 DIAGNOSIS — Z9181 History of falling: Secondary | ICD-10-CM

## 2020-09-17 DIAGNOSIS — I10 Essential (primary) hypertension: Secondary | ICD-10-CM | POA: Diagnosis not present

## 2020-09-17 DIAGNOSIS — E785 Hyperlipidemia, unspecified: Secondary | ICD-10-CM | POA: Diagnosis not present

## 2020-09-17 DIAGNOSIS — D485 Neoplasm of uncertain behavior of skin: Secondary | ICD-10-CM | POA: Diagnosis not present

## 2020-09-17 DIAGNOSIS — F32A Depression, unspecified: Secondary | ICD-10-CM | POA: Diagnosis not present

## 2020-09-17 DIAGNOSIS — I69351 Hemiplegia and hemiparesis following cerebral infarction affecting right dominant side: Secondary | ICD-10-CM | POA: Diagnosis not present

## 2020-09-17 DIAGNOSIS — H8110 Benign paroxysmal vertigo, unspecified ear: Secondary | ICD-10-CM | POA: Diagnosis not present

## 2020-09-17 DIAGNOSIS — D696 Thrombocytopenia, unspecified: Secondary | ICD-10-CM | POA: Diagnosis not present

## 2020-09-17 DIAGNOSIS — M1990 Unspecified osteoarthritis, unspecified site: Secondary | ICD-10-CM

## 2020-09-17 DIAGNOSIS — I2510 Atherosclerotic heart disease of native coronary artery without angina pectoris: Secondary | ICD-10-CM | POA: Diagnosis not present

## 2020-09-17 DIAGNOSIS — E119 Type 2 diabetes mellitus without complications: Secondary | ICD-10-CM | POA: Diagnosis not present

## 2020-09-21 DIAGNOSIS — I2510 Atherosclerotic heart disease of native coronary artery without angina pectoris: Secondary | ICD-10-CM | POA: Diagnosis not present

## 2020-09-21 DIAGNOSIS — R1310 Dysphagia, unspecified: Secondary | ICD-10-CM | POA: Diagnosis not present

## 2020-09-21 DIAGNOSIS — I10 Essential (primary) hypertension: Secondary | ICD-10-CM | POA: Diagnosis not present

## 2020-09-21 DIAGNOSIS — L89312 Pressure ulcer of right buttock, stage 2: Secondary | ICD-10-CM | POA: Diagnosis not present

## 2020-09-21 DIAGNOSIS — I69351 Hemiplegia and hemiparesis following cerebral infarction affecting right dominant side: Secondary | ICD-10-CM | POA: Diagnosis not present

## 2020-09-21 DIAGNOSIS — I69391 Dysphagia following cerebral infarction: Secondary | ICD-10-CM | POA: Diagnosis not present

## 2020-09-21 DIAGNOSIS — E119 Type 2 diabetes mellitus without complications: Secondary | ICD-10-CM | POA: Diagnosis not present

## 2020-09-23 DIAGNOSIS — D485 Neoplasm of uncertain behavior of skin: Secondary | ICD-10-CM | POA: Diagnosis not present

## 2020-09-23 DIAGNOSIS — I69351 Hemiplegia and hemiparesis following cerebral infarction affecting right dominant side: Secondary | ICD-10-CM | POA: Diagnosis not present

## 2020-09-23 DIAGNOSIS — J449 Chronic obstructive pulmonary disease, unspecified: Secondary | ICD-10-CM | POA: Diagnosis not present

## 2020-09-23 DIAGNOSIS — Z952 Presence of prosthetic heart valve: Secondary | ICD-10-CM | POA: Diagnosis not present

## 2020-09-23 DIAGNOSIS — E119 Type 2 diabetes mellitus without complications: Secondary | ICD-10-CM | POA: Diagnosis not present

## 2020-09-23 DIAGNOSIS — M1990 Unspecified osteoarthritis, unspecified site: Secondary | ICD-10-CM | POA: Diagnosis not present

## 2020-09-23 DIAGNOSIS — Z87891 Personal history of nicotine dependence: Secondary | ICD-10-CM | POA: Diagnosis not present

## 2020-09-23 DIAGNOSIS — I10 Essential (primary) hypertension: Secondary | ICD-10-CM | POA: Diagnosis not present

## 2020-09-23 DIAGNOSIS — F0789 Other personality and behavioral disorders due to known physiological condition: Secondary | ICD-10-CM | POA: Diagnosis not present

## 2020-09-23 DIAGNOSIS — F32A Depression, unspecified: Secondary | ICD-10-CM | POA: Diagnosis not present

## 2020-09-23 DIAGNOSIS — H8110 Benign paroxysmal vertigo, unspecified ear: Secondary | ICD-10-CM | POA: Diagnosis not present

## 2020-09-23 DIAGNOSIS — Z9181 History of falling: Secondary | ICD-10-CM | POA: Diagnosis not present

## 2020-09-23 DIAGNOSIS — M545 Low back pain, unspecified: Secondary | ICD-10-CM | POA: Diagnosis not present

## 2020-09-23 DIAGNOSIS — I2510 Atherosclerotic heart disease of native coronary artery without angina pectoris: Secondary | ICD-10-CM | POA: Diagnosis not present

## 2020-09-23 DIAGNOSIS — D696 Thrombocytopenia, unspecified: Secondary | ICD-10-CM | POA: Diagnosis not present

## 2020-09-23 DIAGNOSIS — E785 Hyperlipidemia, unspecified: Secondary | ICD-10-CM | POA: Diagnosis not present

## 2020-09-23 DIAGNOSIS — I69391 Dysphagia following cerebral infarction: Secondary | ICD-10-CM | POA: Diagnosis not present

## 2020-09-23 DIAGNOSIS — L89312 Pressure ulcer of right buttock, stage 2: Secondary | ICD-10-CM | POA: Diagnosis not present

## 2020-09-23 DIAGNOSIS — R1310 Dysphagia, unspecified: Secondary | ICD-10-CM | POA: Diagnosis not present

## 2020-09-24 DIAGNOSIS — L89312 Pressure ulcer of right buttock, stage 2: Secondary | ICD-10-CM | POA: Diagnosis not present

## 2020-09-24 DIAGNOSIS — I10 Essential (primary) hypertension: Secondary | ICD-10-CM | POA: Diagnosis not present

## 2020-09-24 DIAGNOSIS — I69391 Dysphagia following cerebral infarction: Secondary | ICD-10-CM | POA: Diagnosis not present

## 2020-09-24 DIAGNOSIS — I69351 Hemiplegia and hemiparesis following cerebral infarction affecting right dominant side: Secondary | ICD-10-CM | POA: Diagnosis not present

## 2020-09-24 DIAGNOSIS — R1310 Dysphagia, unspecified: Secondary | ICD-10-CM | POA: Diagnosis not present

## 2020-09-24 DIAGNOSIS — E119 Type 2 diabetes mellitus without complications: Secondary | ICD-10-CM | POA: Diagnosis not present

## 2020-09-25 ENCOUNTER — Telehealth: Payer: Self-pay | Admitting: Internal Medicine

## 2020-09-25 DIAGNOSIS — R1310 Dysphagia, unspecified: Secondary | ICD-10-CM | POA: Diagnosis not present

## 2020-09-25 DIAGNOSIS — I69391 Dysphagia following cerebral infarction: Secondary | ICD-10-CM | POA: Diagnosis not present

## 2020-09-25 DIAGNOSIS — E119 Type 2 diabetes mellitus without complications: Secondary | ICD-10-CM | POA: Diagnosis not present

## 2020-09-25 DIAGNOSIS — I69351 Hemiplegia and hemiparesis following cerebral infarction affecting right dominant side: Secondary | ICD-10-CM | POA: Diagnosis not present

## 2020-09-25 DIAGNOSIS — L89312 Pressure ulcer of right buttock, stage 2: Secondary | ICD-10-CM | POA: Diagnosis not present

## 2020-09-25 DIAGNOSIS — I10 Essential (primary) hypertension: Secondary | ICD-10-CM | POA: Diagnosis not present

## 2020-09-25 NOTE — Telephone Encounter (Signed)
[name] from [facility], states when they reassessed last week they saw he has a re-occurring pressure wound to his bottom so they were wondering if it can be added to his active diagnosis [name]

## 2020-09-25 NOTE — Telephone Encounter (Signed)
Called [name] to see if verbal can be given to add re-occuring pressure wound to his bottom. Per [name] yes, but the daughter just inform hr that pt ha an appt w/ the wound clinic next week. So she will hold off on the nursing orders. She states she will keep [place] posted if he need anything else.Marland KitchenJohny [name]

## 2020-09-25 NOTE — Telephone Encounter (Signed)
Okay.  Thanks.

## 2020-09-28 ENCOUNTER — Ambulatory Visit (INDEPENDENT_AMBULATORY_CARE_PROVIDER_SITE_OTHER): Payer: Medicare Other | Admitting: Internal Medicine

## 2020-09-28 ENCOUNTER — Encounter: Payer: Self-pay | Admitting: Internal Medicine

## 2020-09-28 ENCOUNTER — Ambulatory Visit (INDEPENDENT_AMBULATORY_CARE_PROVIDER_SITE_OTHER): Payer: Medicare Other

## 2020-09-28 ENCOUNTER — Other Ambulatory Visit: Payer: Self-pay

## 2020-09-28 ENCOUNTER — Telehealth: Payer: Self-pay | Admitting: Internal Medicine

## 2020-09-28 DIAGNOSIS — R1310 Dysphagia, unspecified: Secondary | ICD-10-CM | POA: Diagnosis not present

## 2020-09-28 DIAGNOSIS — I611 Nontraumatic intracerebral hemorrhage in hemisphere, cortical: Secondary | ICD-10-CM

## 2020-09-28 DIAGNOSIS — I700 Atherosclerosis of aorta: Secondary | ICD-10-CM | POA: Diagnosis not present

## 2020-09-28 DIAGNOSIS — I69391 Dysphagia following cerebral infarction: Secondary | ICD-10-CM | POA: Diagnosis not present

## 2020-09-28 DIAGNOSIS — I69351 Hemiplegia and hemiparesis following cerebral infarction affecting right dominant side: Secondary | ICD-10-CM | POA: Diagnosis not present

## 2020-09-28 DIAGNOSIS — R059 Cough, unspecified: Secondary | ICD-10-CM

## 2020-09-28 DIAGNOSIS — E119 Type 2 diabetes mellitus without complications: Secondary | ICD-10-CM | POA: Diagnosis not present

## 2020-09-28 DIAGNOSIS — L89312 Pressure ulcer of right buttock, stage 2: Secondary | ICD-10-CM | POA: Diagnosis not present

## 2020-09-28 DIAGNOSIS — R053 Chronic cough: Secondary | ICD-10-CM | POA: Diagnosis not present

## 2020-09-28 DIAGNOSIS — E785 Hyperlipidemia, unspecified: Secondary | ICD-10-CM | POA: Diagnosis not present

## 2020-09-28 DIAGNOSIS — D582 Other hemoglobinopathies: Secondary | ICD-10-CM | POA: Diagnosis not present

## 2020-09-28 DIAGNOSIS — R739 Hyperglycemia, unspecified: Secondary | ICD-10-CM | POA: Diagnosis not present

## 2020-09-28 DIAGNOSIS — I639 Cerebral infarction, unspecified: Secondary | ICD-10-CM | POA: Diagnosis not present

## 2020-09-28 DIAGNOSIS — I10 Essential (primary) hypertension: Secondary | ICD-10-CM | POA: Diagnosis not present

## 2020-09-28 LAB — HEMOGLOBIN A1C: Hgb A1c MFr Bld: 5.9 % (ref 4.6–6.5)

## 2020-09-28 LAB — CBC WITH DIFFERENTIAL/PLATELET
Basophils Absolute: 0 10*3/uL (ref 0.0–0.1)
Basophils Relative: 0.4 % (ref 0.0–3.0)
Eosinophils Absolute: 0.3 10*3/uL (ref 0.0–0.7)
Eosinophils Relative: 3.9 % (ref 0.0–5.0)
HCT: 45.6 % (ref 39.0–52.0)
Hemoglobin: 15.5 g/dL (ref 13.0–17.0)
Lymphocytes Relative: 25.2 % (ref 12.0–46.0)
Lymphs Abs: 1.9 10*3/uL (ref 0.7–4.0)
MCHC: 34 g/dL (ref 30.0–36.0)
MCV: 93.4 fl (ref 78.0–100.0)
Monocytes Absolute: 0.7 10*3/uL (ref 0.1–1.0)
Monocytes Relative: 8.6 % (ref 3.0–12.0)
Neutro Abs: 4.7 10*3/uL (ref 1.4–7.7)
Neutrophils Relative %: 61.9 % (ref 43.0–77.0)
Platelets: 132 10*3/uL — ABNORMAL LOW (ref 150.0–400.0)
RBC: 4.88 Mil/uL (ref 4.22–5.81)
RDW: 13.4 % (ref 11.5–15.5)
WBC: 7.6 10*3/uL (ref 4.0–10.5)

## 2020-09-28 LAB — COMPREHENSIVE METABOLIC PANEL
ALT: 8 U/L (ref 0–53)
AST: 13 U/L (ref 0–37)
Albumin: 4 g/dL (ref 3.5–5.2)
Alkaline Phosphatase: 67 U/L (ref 39–117)
BUN: 21 mg/dL (ref 6–23)
CO2: 29 mEq/L (ref 19–32)
Calcium: 9.5 mg/dL (ref 8.4–10.5)
Chloride: 103 mEq/L (ref 96–112)
Creatinine, Ser: 1.24 mg/dL (ref 0.40–1.50)
GFR: 49.55 mL/min — ABNORMAL LOW (ref >60.00)
Glucose, Bld: 117 mg/dL — ABNORMAL HIGH (ref 70–99)
Potassium: 4.4 mEq/L (ref 3.5–5.1)
Sodium: 139 mEq/L (ref 135–145)
Total Bilirubin: 0.5 mg/dL (ref 0.2–1.2)
Total Protein: 7.1 g/dL (ref 6.0–8.3)

## 2020-09-28 LAB — TSH: TSH: 2.32 u[IU]/mL (ref 0.35–4.50)

## 2020-09-28 MED ORDER — ALBUTEROL SULFATE 2 MG PO TABS: 2.0000 mg | ORAL_TABLET | Freq: Two times a day (BID) | ORAL | 3 refills | Status: DC | PRN

## 2020-09-28 NOTE — Addendum Note (Signed)
Addended by: Jacobo Forest on: 09/28/2020 11:29 AM   Modules accepted: Orders

## 2020-09-28 NOTE — Telephone Encounter (Signed)
[name] with [facility] calling, states patient is being discharged from OT today.

## 2020-09-28 NOTE — Assessment & Plan Note (Signed)
Check CBC 

## 2020-09-28 NOTE — Assessment & Plan Note (Signed)
Check A1c On diet 

## 2020-09-28 NOTE — Progress Notes (Signed)
Subjective:  Patient ID: David [name], male    DOB: 09/25/1923  Age: 87 y.o. MRN: [id number]  CC: Follow-up (3 month f/u)   HPI David [name] presents for CVA, depression, GERD f/u Comes w/son and dtr  Outpatient Medications Prior to Visit  Medication Sig Dispense Refill  . acetaminophen (TYLENOL) 325 MG tablet Take 1-2 tablets (325-650 mg total) by mouth every 4 (four) hours as needed for mild pain.    Marland Kitchen atenolol (TENORMIN) 25 MG tablet Take 1 tablet (25 mg total) by mouth daily. (Patient taking differently: Take 12.5 mg by mouth daily.) 30 tablet 0  . clotrimazole-betamethasone (LOTRISONE) cream Apply 1 application topically 2 (two) times daily. 90 g 1  . escitalopram (LEXAPRO) 10 MG tablet Take 1 tablet (10 mg total) by mouth daily. 90 tablet 3  . ezetimibe (ZETIA) 10 MG tablet Take 1 tablet (10 mg total) by mouth daily. 30 tablet 0  . lactulose (CHRONULAC) 10 GM/15ML solution 30-60 ml bid po prn 1000 mL 5  . pantoprazole (PROTONIX) 40 MG tablet Take 1 tablet (40 mg total) by mouth daily. 90 tablet 3  . polyethylene glycol (MIRALAX / GLYCOLAX) 17 g packet Take 17 g by mouth daily.    Marland Kitchen senna-docusate (SENOKOT-S) 8.6-50 MG tablet Take 2 tablets by mouth 2 (two) times daily. 60 tablet 0   No facility-administered medications prior to visit.    ROS: Review of Systems  Constitutional: Negative for appetite change, fatigue and unexpected weight change.  HENT: Negative for congestion, nosebleeds, sneezing, sore throat and trouble swallowing.   Eyes: Negative for itching and visual disturbance.  Respiratory: Positive for cough.   Cardiovascular: Negative for chest pain, palpitations and leg swelling.  Gastrointestinal: Negative for abdominal distention, blood in stool, diarrhea and nausea.  Genitourinary: Negative for frequency and hematuria.  Musculoskeletal: Negative for back pain, gait problem, joint swelling and neck pain.  Skin: Negative for rash.  Neurological: Positive for  weakness. Negative for dizziness, tremors and speech difficulty.  Psychiatric/Behavioral: Positive for dysphoric mood. Negative for agitation, sleep disturbance and suicidal ideas. The patient is not nervous/anxious.     Objective:  BP 120/68 (BP Location: Left Arm)   Pulse (!) 59   Temp 97.7 F (36.5 C) (Oral)   Ht 5\' 11"  (1.803 m)   Wt 190 lb 6.4 oz (86.4 kg)   SpO2 97%   BMI 26.56 kg/m   BP Readings from Last 3 Encounters:  09/28/20 120/68  08/19/20 108/66  06/29/20 128/66    Wt Readings from Last 3 Encounters:  09/28/20 190 lb 6.4 oz (86.4 kg)  02/19/20 190 lb (86.2 kg)  01/29/20 190 lb (86.2 kg)    Physical Exam Constitutional:      General: He is not in acute distress.    Appearance: He is well-developed.     Comments: NAD  Eyes:     Conjunctiva/sclera: Conjunctivae normal.     Pupils: Pupils are equal, round, and reactive to light.  Neck:     Thyroid: No thyromegaly.     Vascular: No JVD.  Cardiovascular:     Rate and Rhythm: Normal rate and regular rhythm.     Heart sounds: Normal heart sounds. No murmur heard. No friction rub. No gallop.   Pulmonary:     Effort: Pulmonary effort is normal. No respiratory distress.     Breath sounds: Normal breath sounds. No wheezing or rales.  Chest:     Chest wall: No tenderness.  Abdominal:  General: Bowel sounds are normal. There is no distension.     Palpations: Abdomen is soft. There is no mass.     Tenderness: There is no abdominal tenderness. There is no guarding or rebound.  Musculoskeletal:        General: No tenderness. Normal range of motion.     Cervical back: Normal range of motion.  Lymphadenopathy:     Cervical: No cervical adenopathy.  Skin:    General: Skin is warm and dry.     Findings: No rash.  Neurological:     Mental Status: He is alert and oriented to person, place, and time.     Cranial Nerves: No cranial nerve deficit.     Motor: Weakness present. No abnormal muscle tone.      Coordination: Coordination abnormal.     Gait: Gait abnormal.     Deep Tendon Reflexes: Reflexes are normal and symmetric.  Psychiatric:        Behavior: Behavior normal.        Thought Content: Thought content normal.        Judgment: Judgment normal.   hemiparesis   Lab Results  Component Value Date   WBC 8.3 03/30/2020   HGB 15.8 03/30/2020   HCT 47.0 03/30/2020   PLT 122.0 (L) 03/30/2020   GLUCOSE 138 (H) 03/30/2020   CHOL 189 01/02/2020   TRIG 125 01/02/2020   HDL 43 01/02/2020   LDLDIRECT 136.2 08/01/2007   LDLCALC 122 (H) 01/02/2020   ALT 13 03/30/2020   AST 15 03/30/2020   NA 141 03/30/2020   K 4.0 03/30/2020   CL 104 03/30/2020   CREATININE 1.23 03/30/2020   BUN 14 03/30/2020   CO2 30 03/30/2020   TSH 1.63 01/02/2020   PSA 4.88 (H) 05/20/2019   INR 1.2 09/06/2019   HGBA1C 5.9 (H) 09/04/2019    DG Chest 2 View  Result Date: 09/10/2019 CLINICAL DATA:  Fever EXAM: CHEST - 2 VIEW COMPARISON:  09/04/2019 FINDINGS: Prior CABG and aortic valve replacement. The heart size and mediastinal contours are stable. Low lung volumes. Increased interstitial markings within the left lung base, slightly progressed from prior. No pneumothorax. IMPRESSION: Increased interstitial markings within the left lung base, slightly progressed from prior. Findings could represent atelectasis versus pneumonia. Electronically Signed   By: Davina Poke D.O.   On: 09/10/2019 16:33   DG Abd 1 View  Result Date: 09/10/2019 CLINICAL DATA:  Abdominal distension. EXAM: ABDOMEN - 1 VIEW COMPARISON:  None. FINDINGS: The bowel gas pattern is normal. Residual contrast is noted in the colon. No radio-opaque calculi or other significant radiographic abnormality are seen. IMPRESSION: No evidence of bowel obstruction or ileus. Electronically Signed   By: Marijo Conception M.D.   On: 09/10/2019 16:32   CT HEAD WO CONTRAST  Result Date: 09/10/2019 CLINICAL DATA:  Follow-up left basal ganglia hemorrhage.  Increased lethargy. EXAM: CT HEAD WITHOUT CONTRAST TECHNIQUE: Contiguous axial images were obtained from the base of the skull through the vertex without intravenous contrast. COMPARISON:  09/04/2019 FINDINGS: Brain: Slight enlargement of the left basal ganglia intraparenchymal hemorrhage, measuring 4.4 x 2.5 x 3.6 cm (volume = 21 cm^3) today compared with 16 cc volume 6 days ago. Additionally, there is intraventricular penetration, having breach the atrium of the left lateral ventricle, with a small amount of blood dependent in both occipital horns. Ventricular size remains stable however without hydrocephalus. Surrounding edema is slightly more pronounced. One could question if there has been extension of  infarction more anteriorly within the basal ganglia and external capsule or if this relates to edema from the hemorrhage. Other small-vessel ischemic changes throughout brain appear stable. No new large vessel infarction. No extra-axial collection. Vascular: There is atherosclerotic calcification of the major vessels at the base of the brain. Skull: Negative Sinuses/Orbits: Clear/normal Other: None IMPRESSION: Slight enlargement of the left basal ganglia intraparenchymal hemorrhage when compared to the study of 6 days ago. Previous volume was calculated at 16 cc. Today calculated at 21 cc. There is also intraventricular penetration with a small amount of blood dependent in the occipital horns of both lateral ventricles but no hydrocephalus. Slightly more surrounding edema as expected. Some edema extends anterior into the basal ganglia and external capsule region, which could either be extension of edema or slight extension of the infarction itself. These results will be called to the ordering clinician or representative by the Radiologist Assistant, and communication documented in the PACS or [employer]. Electronically Signed   By: Nelson Chimes M.D.   On: 09/10/2019 16:57    Assessment & Plan:    Walker Kehr, MD

## 2020-09-28 NOTE — Assessment & Plan Note (Signed)
PT at home 

## 2020-09-28 NOTE — Assessment & Plan Note (Signed)
CXR Albuterol po If not better - HHN Duoneb

## 2020-09-28 NOTE — Assessment & Plan Note (Signed)
On Zetia 

## 2020-09-30 DIAGNOSIS — I69351 Hemiplegia and hemiparesis following cerebral infarction affecting right dominant side: Secondary | ICD-10-CM | POA: Diagnosis not present

## 2020-09-30 DIAGNOSIS — R1310 Dysphagia, unspecified: Secondary | ICD-10-CM | POA: Diagnosis not present

## 2020-09-30 DIAGNOSIS — L89312 Pressure ulcer of right buttock, stage 2: Secondary | ICD-10-CM | POA: Diagnosis not present

## 2020-09-30 DIAGNOSIS — I69391 Dysphagia following cerebral infarction: Secondary | ICD-10-CM | POA: Diagnosis not present

## 2020-09-30 DIAGNOSIS — E119 Type 2 diabetes mellitus without complications: Secondary | ICD-10-CM | POA: Diagnosis not present

## 2020-09-30 DIAGNOSIS — I10 Essential (primary) hypertension: Secondary | ICD-10-CM | POA: Diagnosis not present

## 2020-10-01 DIAGNOSIS — I10 Essential (primary) hypertension: Secondary | ICD-10-CM | POA: Diagnosis not present

## 2020-10-01 DIAGNOSIS — I69351 Hemiplegia and hemiparesis following cerebral infarction affecting right dominant side: Secondary | ICD-10-CM | POA: Diagnosis not present

## 2020-10-01 DIAGNOSIS — R1310 Dysphagia, unspecified: Secondary | ICD-10-CM | POA: Diagnosis not present

## 2020-10-01 DIAGNOSIS — E119 Type 2 diabetes mellitus without complications: Secondary | ICD-10-CM | POA: Diagnosis not present

## 2020-10-01 DIAGNOSIS — I69391 Dysphagia following cerebral infarction: Secondary | ICD-10-CM | POA: Diagnosis not present

## 2020-10-01 DIAGNOSIS — L89312 Pressure ulcer of right buttock, stage 2: Secondary | ICD-10-CM | POA: Diagnosis not present

## 2020-10-05 DIAGNOSIS — I10 Essential (primary) hypertension: Secondary | ICD-10-CM | POA: Diagnosis not present

## 2020-10-05 DIAGNOSIS — L89312 Pressure ulcer of right buttock, stage 2: Secondary | ICD-10-CM | POA: Diagnosis not present

## 2020-10-05 DIAGNOSIS — I69391 Dysphagia following cerebral infarction: Secondary | ICD-10-CM | POA: Diagnosis not present

## 2020-10-05 DIAGNOSIS — I69351 Hemiplegia and hemiparesis following cerebral infarction affecting right dominant side: Secondary | ICD-10-CM | POA: Diagnosis not present

## 2020-10-05 DIAGNOSIS — R1310 Dysphagia, unspecified: Secondary | ICD-10-CM | POA: Diagnosis not present

## 2020-10-05 DIAGNOSIS — E119 Type 2 diabetes mellitus without complications: Secondary | ICD-10-CM | POA: Diagnosis not present

## 2020-10-07 DIAGNOSIS — L89312 Pressure ulcer of right buttock, stage 2: Secondary | ICD-10-CM | POA: Diagnosis not present

## 2020-10-07 DIAGNOSIS — I10 Essential (primary) hypertension: Secondary | ICD-10-CM | POA: Diagnosis not present

## 2020-10-07 DIAGNOSIS — E119 Type 2 diabetes mellitus without complications: Secondary | ICD-10-CM | POA: Diagnosis not present

## 2020-10-07 DIAGNOSIS — I69351 Hemiplegia and hemiparesis following cerebral infarction affecting right dominant side: Secondary | ICD-10-CM | POA: Diagnosis not present

## 2020-10-07 DIAGNOSIS — I69391 Dysphagia following cerebral infarction: Secondary | ICD-10-CM | POA: Diagnosis not present

## 2020-10-07 DIAGNOSIS — R1310 Dysphagia, unspecified: Secondary | ICD-10-CM | POA: Diagnosis not present

## 2020-10-13 DIAGNOSIS — E119 Type 2 diabetes mellitus without complications: Secondary | ICD-10-CM | POA: Diagnosis not present

## 2020-10-13 DIAGNOSIS — L89312 Pressure ulcer of right buttock, stage 2: Secondary | ICD-10-CM | POA: Diagnosis not present

## 2020-10-13 DIAGNOSIS — I10 Essential (primary) hypertension: Secondary | ICD-10-CM | POA: Diagnosis not present

## 2020-10-13 DIAGNOSIS — I69351 Hemiplegia and hemiparesis following cerebral infarction affecting right dominant side: Secondary | ICD-10-CM | POA: Diagnosis not present

## 2020-10-13 DIAGNOSIS — I69391 Dysphagia following cerebral infarction: Secondary | ICD-10-CM | POA: Diagnosis not present

## 2020-10-13 DIAGNOSIS — R1310 Dysphagia, unspecified: Secondary | ICD-10-CM | POA: Diagnosis not present

## 2020-10-15 DIAGNOSIS — R1310 Dysphagia, unspecified: Secondary | ICD-10-CM | POA: Diagnosis not present

## 2020-10-15 DIAGNOSIS — L89312 Pressure ulcer of right buttock, stage 2: Secondary | ICD-10-CM | POA: Diagnosis not present

## 2020-10-15 DIAGNOSIS — E119 Type 2 diabetes mellitus without complications: Secondary | ICD-10-CM | POA: Diagnosis not present

## 2020-10-15 DIAGNOSIS — I69391 Dysphagia following cerebral infarction: Secondary | ICD-10-CM | POA: Diagnosis not present

## 2020-10-15 DIAGNOSIS — I10 Essential (primary) hypertension: Secondary | ICD-10-CM | POA: Diagnosis not present

## 2020-10-15 DIAGNOSIS — I69351 Hemiplegia and hemiparesis following cerebral infarction affecting right dominant side: Secondary | ICD-10-CM | POA: Diagnosis not present

## 2020-10-19 DIAGNOSIS — I69391 Dysphagia following cerebral infarction: Secondary | ICD-10-CM | POA: Diagnosis not present

## 2020-10-19 DIAGNOSIS — E119 Type 2 diabetes mellitus without complications: Secondary | ICD-10-CM | POA: Diagnosis not present

## 2020-10-19 DIAGNOSIS — I10 Essential (primary) hypertension: Secondary | ICD-10-CM | POA: Diagnosis not present

## 2020-10-19 DIAGNOSIS — L89312 Pressure ulcer of right buttock, stage 2: Secondary | ICD-10-CM | POA: Diagnosis not present

## 2020-10-19 DIAGNOSIS — I69351 Hemiplegia and hemiparesis following cerebral infarction affecting right dominant side: Secondary | ICD-10-CM | POA: Diagnosis not present

## 2020-10-19 DIAGNOSIS — R1310 Dysphagia, unspecified: Secondary | ICD-10-CM | POA: Diagnosis not present

## 2020-10-23 DIAGNOSIS — L89312 Pressure ulcer of right buttock, stage 2: Secondary | ICD-10-CM | POA: Diagnosis not present

## 2020-10-23 DIAGNOSIS — M1990 Unspecified osteoarthritis, unspecified site: Secondary | ICD-10-CM | POA: Diagnosis not present

## 2020-10-23 DIAGNOSIS — I69391 Dysphagia following cerebral infarction: Secondary | ICD-10-CM | POA: Diagnosis not present

## 2020-10-23 DIAGNOSIS — F32A Depression, unspecified: Secondary | ICD-10-CM | POA: Diagnosis not present

## 2020-10-23 DIAGNOSIS — Z9181 History of falling: Secondary | ICD-10-CM | POA: Diagnosis not present

## 2020-10-23 DIAGNOSIS — F0789 Other personality and behavioral disorders due to known physiological condition: Secondary | ICD-10-CM | POA: Diagnosis not present

## 2020-10-23 DIAGNOSIS — J449 Chronic obstructive pulmonary disease, unspecified: Secondary | ICD-10-CM | POA: Diagnosis not present

## 2020-10-23 DIAGNOSIS — D485 Neoplasm of uncertain behavior of skin: Secondary | ICD-10-CM | POA: Diagnosis not present

## 2020-10-23 DIAGNOSIS — R1310 Dysphagia, unspecified: Secondary | ICD-10-CM | POA: Diagnosis not present

## 2020-10-23 DIAGNOSIS — I2510 Atherosclerotic heart disease of native coronary artery without angina pectoris: Secondary | ICD-10-CM | POA: Diagnosis not present

## 2020-10-23 DIAGNOSIS — E119 Type 2 diabetes mellitus without complications: Secondary | ICD-10-CM | POA: Diagnosis not present

## 2020-10-23 DIAGNOSIS — D696 Thrombocytopenia, unspecified: Secondary | ICD-10-CM | POA: Diagnosis not present

## 2020-10-23 DIAGNOSIS — I10 Essential (primary) hypertension: Secondary | ICD-10-CM | POA: Diagnosis not present

## 2020-10-23 DIAGNOSIS — Z952 Presence of prosthetic heart valve: Secondary | ICD-10-CM | POA: Diagnosis not present

## 2020-10-23 DIAGNOSIS — H8110 Benign paroxysmal vertigo, unspecified ear: Secondary | ICD-10-CM | POA: Diagnosis not present

## 2020-10-23 DIAGNOSIS — E785 Hyperlipidemia, unspecified: Secondary | ICD-10-CM | POA: Diagnosis not present

## 2020-10-23 DIAGNOSIS — M545 Low back pain, unspecified: Secondary | ICD-10-CM | POA: Diagnosis not present

## 2020-10-23 DIAGNOSIS — Z87891 Personal history of nicotine dependence: Secondary | ICD-10-CM | POA: Diagnosis not present

## 2020-10-23 DIAGNOSIS — I69351 Hemiplegia and hemiparesis following cerebral infarction affecting right dominant side: Secondary | ICD-10-CM | POA: Diagnosis not present

## 2020-10-26 ENCOUNTER — Telehealth: Payer: Self-pay | Admitting: Internal Medicine

## 2020-10-26 DIAGNOSIS — I69351 Hemiplegia and hemiparesis following cerebral infarction affecting right dominant side: Secondary | ICD-10-CM | POA: Diagnosis not present

## 2020-10-26 DIAGNOSIS — I69391 Dysphagia following cerebral infarction: Secondary | ICD-10-CM | POA: Diagnosis not present

## 2020-10-26 DIAGNOSIS — E119 Type 2 diabetes mellitus without complications: Secondary | ICD-10-CM | POA: Diagnosis not present

## 2020-10-26 DIAGNOSIS — L89312 Pressure ulcer of right buttock, stage 2: Secondary | ICD-10-CM | POA: Diagnosis not present

## 2020-10-26 DIAGNOSIS — I10 Essential (primary) hypertension: Secondary | ICD-10-CM | POA: Diagnosis not present

## 2020-10-26 DIAGNOSIS — R1310 Dysphagia, unspecified: Secondary | ICD-10-CM | POA: Diagnosis not present

## 2020-10-26 NOTE — Telephone Encounter (Signed)
Okay.  Thanks.

## 2020-10-26 NOTE — Telephone Encounter (Signed)
    David [name] from David [name] Name [facility] Agency Name: [facility] Phone #: [phone] Service Requested: nursing eval for wound care Frequency of Visits: nursing eval

## 2020-10-27 NOTE — Telephone Encounter (Signed)
Called [name] back there was no answer LMOM w/MD response.Marland KitchenJohny Chess

## 2020-10-28 DIAGNOSIS — L89312 Pressure ulcer of right buttock, stage 2: Secondary | ICD-10-CM | POA: Diagnosis not present

## 2020-10-28 DIAGNOSIS — E119 Type 2 diabetes mellitus without complications: Secondary | ICD-10-CM | POA: Diagnosis not present

## 2020-10-28 DIAGNOSIS — R1310 Dysphagia, unspecified: Secondary | ICD-10-CM | POA: Diagnosis not present

## 2020-10-28 DIAGNOSIS — I69351 Hemiplegia and hemiparesis following cerebral infarction affecting right dominant side: Secondary | ICD-10-CM | POA: Diagnosis not present

## 2020-10-28 DIAGNOSIS — I10 Essential (primary) hypertension: Secondary | ICD-10-CM | POA: Diagnosis not present

## 2020-10-28 DIAGNOSIS — I69391 Dysphagia following cerebral infarction: Secondary | ICD-10-CM | POA: Diagnosis not present

## 2020-10-30 DIAGNOSIS — E119 Type 2 diabetes mellitus without complications: Secondary | ICD-10-CM | POA: Diagnosis not present

## 2020-10-30 DIAGNOSIS — I69351 Hemiplegia and hemiparesis following cerebral infarction affecting right dominant side: Secondary | ICD-10-CM | POA: Diagnosis not present

## 2020-10-30 DIAGNOSIS — I69391 Dysphagia following cerebral infarction: Secondary | ICD-10-CM | POA: Diagnosis not present

## 2020-10-30 DIAGNOSIS — L89312 Pressure ulcer of right buttock, stage 2: Secondary | ICD-10-CM | POA: Diagnosis not present

## 2020-10-30 DIAGNOSIS — R1310 Dysphagia, unspecified: Secondary | ICD-10-CM | POA: Diagnosis not present

## 2020-10-30 DIAGNOSIS — I10 Essential (primary) hypertension: Secondary | ICD-10-CM | POA: Diagnosis not present

## 2020-11-02 DIAGNOSIS — I69391 Dysphagia following cerebral infarction: Secondary | ICD-10-CM | POA: Diagnosis not present

## 2020-11-02 DIAGNOSIS — R1310 Dysphagia, unspecified: Secondary | ICD-10-CM | POA: Diagnosis not present

## 2020-11-02 DIAGNOSIS — I69351 Hemiplegia and hemiparesis following cerebral infarction affecting right dominant side: Secondary | ICD-10-CM | POA: Diagnosis not present

## 2020-11-02 DIAGNOSIS — L89312 Pressure ulcer of right buttock, stage 2: Secondary | ICD-10-CM | POA: Diagnosis not present

## 2020-11-02 DIAGNOSIS — E119 Type 2 diabetes mellitus without complications: Secondary | ICD-10-CM | POA: Diagnosis not present

## 2020-11-02 DIAGNOSIS — I10 Essential (primary) hypertension: Secondary | ICD-10-CM | POA: Diagnosis not present

## 2020-11-04 DIAGNOSIS — I10 Essential (primary) hypertension: Secondary | ICD-10-CM | POA: Diagnosis not present

## 2020-11-04 DIAGNOSIS — E119 Type 2 diabetes mellitus without complications: Secondary | ICD-10-CM | POA: Diagnosis not present

## 2020-11-04 DIAGNOSIS — I69351 Hemiplegia and hemiparesis following cerebral infarction affecting right dominant side: Secondary | ICD-10-CM | POA: Diagnosis not present

## 2020-11-04 DIAGNOSIS — L89312 Pressure ulcer of right buttock, stage 2: Secondary | ICD-10-CM | POA: Diagnosis not present

## 2020-11-04 DIAGNOSIS — I69391 Dysphagia following cerebral infarction: Secondary | ICD-10-CM | POA: Diagnosis not present

## 2020-11-04 DIAGNOSIS — R1310 Dysphagia, unspecified: Secondary | ICD-10-CM | POA: Diagnosis not present

## 2020-11-06 DIAGNOSIS — I69351 Hemiplegia and hemiparesis following cerebral infarction affecting right dominant side: Secondary | ICD-10-CM | POA: Diagnosis not present

## 2020-11-06 DIAGNOSIS — L89312 Pressure ulcer of right buttock, stage 2: Secondary | ICD-10-CM | POA: Diagnosis not present

## 2020-11-06 DIAGNOSIS — I69391 Dysphagia following cerebral infarction: Secondary | ICD-10-CM | POA: Diagnosis not present

## 2020-11-06 DIAGNOSIS — I10 Essential (primary) hypertension: Secondary | ICD-10-CM | POA: Diagnosis not present

## 2020-11-06 DIAGNOSIS — R1310 Dysphagia, unspecified: Secondary | ICD-10-CM | POA: Diagnosis not present

## 2020-11-06 DIAGNOSIS — E119 Type 2 diabetes mellitus without complications: Secondary | ICD-10-CM | POA: Diagnosis not present

## 2020-11-10 DIAGNOSIS — I69351 Hemiplegia and hemiparesis following cerebral infarction affecting right dominant side: Secondary | ICD-10-CM | POA: Diagnosis not present

## 2020-11-10 DIAGNOSIS — I10 Essential (primary) hypertension: Secondary | ICD-10-CM | POA: Diagnosis not present

## 2020-11-10 DIAGNOSIS — L89312 Pressure ulcer of right buttock, stage 2: Secondary | ICD-10-CM | POA: Diagnosis not present

## 2020-11-10 DIAGNOSIS — I69391 Dysphagia following cerebral infarction: Secondary | ICD-10-CM | POA: Diagnosis not present

## 2020-11-10 DIAGNOSIS — R1310 Dysphagia, unspecified: Secondary | ICD-10-CM | POA: Diagnosis not present

## 2020-11-10 DIAGNOSIS — E119 Type 2 diabetes mellitus without complications: Secondary | ICD-10-CM | POA: Diagnosis not present

## 2020-11-12 DIAGNOSIS — I69391 Dysphagia following cerebral infarction: Secondary | ICD-10-CM | POA: Diagnosis not present

## 2020-11-12 DIAGNOSIS — I10 Essential (primary) hypertension: Secondary | ICD-10-CM | POA: Diagnosis not present

## 2020-11-12 DIAGNOSIS — I69351 Hemiplegia and hemiparesis following cerebral infarction affecting right dominant side: Secondary | ICD-10-CM | POA: Diagnosis not present

## 2020-11-12 DIAGNOSIS — E119 Type 2 diabetes mellitus without complications: Secondary | ICD-10-CM | POA: Diagnosis not present

## 2020-11-12 DIAGNOSIS — L89312 Pressure ulcer of right buttock, stage 2: Secondary | ICD-10-CM | POA: Diagnosis not present

## 2020-11-12 DIAGNOSIS — R1310 Dysphagia, unspecified: Secondary | ICD-10-CM | POA: Diagnosis not present

## 2020-11-18 ENCOUNTER — Telehealth: Payer: Self-pay | Admitting: Internal Medicine

## 2020-11-18 DIAGNOSIS — R1310 Dysphagia, unspecified: Secondary | ICD-10-CM | POA: Diagnosis not present

## 2020-11-18 DIAGNOSIS — L89312 Pressure ulcer of right buttock, stage 2: Secondary | ICD-10-CM | POA: Diagnosis not present

## 2020-11-18 DIAGNOSIS — I10 Essential (primary) hypertension: Secondary | ICD-10-CM | POA: Diagnosis not present

## 2020-11-18 DIAGNOSIS — I69351 Hemiplegia and hemiparesis following cerebral infarction affecting right dominant side: Secondary | ICD-10-CM | POA: Diagnosis not present

## 2020-11-18 DIAGNOSIS — E119 Type 2 diabetes mellitus without complications: Secondary | ICD-10-CM | POA: Diagnosis not present

## 2020-11-18 DIAGNOSIS — I69391 Dysphagia following cerebral infarction: Secondary | ICD-10-CM | POA: Diagnosis not present

## 2020-11-18 NOTE — Telephone Encounter (Signed)
[place] Name: [facility] Agency Name: [facility] Phone #: [phone] Service Requested: PT extended (examples: OT/PT/Skilled Nursing/Social Work/Speech Therapy/Wound Care) Frequency of Visits: 1x for 1wk, 2x for 2wk, 1x for 1wk

## 2020-11-19 NOTE — Telephone Encounter (Signed)
Called David [name] there was no answer LMOM w/MD response.Marland KitchenJohny Chess

## 2020-11-19 NOTE — Telephone Encounter (Signed)
Okay.  Thanks.

## 2020-11-22 DIAGNOSIS — I10 Essential (primary) hypertension: Secondary | ICD-10-CM | POA: Diagnosis not present

## 2020-11-22 DIAGNOSIS — Z8603 Personal history of neoplasm of uncertain behavior: Secondary | ICD-10-CM | POA: Diagnosis not present

## 2020-11-22 DIAGNOSIS — I2510 Atherosclerotic heart disease of native coronary artery without angina pectoris: Secondary | ICD-10-CM | POA: Diagnosis not present

## 2020-11-22 DIAGNOSIS — M1990 Unspecified osteoarthritis, unspecified site: Secondary | ICD-10-CM | POA: Diagnosis not present

## 2020-11-22 DIAGNOSIS — M545 Low back pain, unspecified: Secondary | ICD-10-CM | POA: Diagnosis not present

## 2020-11-22 DIAGNOSIS — F0789 Other personality and behavioral disorders due to known physiological condition: Secondary | ICD-10-CM | POA: Diagnosis not present

## 2020-11-22 DIAGNOSIS — J449 Chronic obstructive pulmonary disease, unspecified: Secondary | ICD-10-CM | POA: Diagnosis not present

## 2020-11-22 DIAGNOSIS — H8110 Benign paroxysmal vertigo, unspecified ear: Secondary | ICD-10-CM | POA: Diagnosis not present

## 2020-11-22 DIAGNOSIS — D696 Thrombocytopenia, unspecified: Secondary | ICD-10-CM | POA: Diagnosis not present

## 2020-11-22 DIAGNOSIS — S31821D Laceration without foreign body of left buttock, subsequent encounter: Secondary | ICD-10-CM | POA: Diagnosis not present

## 2020-11-22 DIAGNOSIS — E785 Hyperlipidemia, unspecified: Secondary | ICD-10-CM | POA: Diagnosis not present

## 2020-11-22 DIAGNOSIS — Z9181 History of falling: Secondary | ICD-10-CM | POA: Diagnosis not present

## 2020-11-22 DIAGNOSIS — E119 Type 2 diabetes mellitus without complications: Secondary | ICD-10-CM | POA: Diagnosis not present

## 2020-11-22 DIAGNOSIS — F32A Depression, unspecified: Secondary | ICD-10-CM | POA: Diagnosis not present

## 2020-11-22 DIAGNOSIS — I69391 Dysphagia following cerebral infarction: Secondary | ICD-10-CM | POA: Diagnosis not present

## 2020-11-22 DIAGNOSIS — Z87891 Personal history of nicotine dependence: Secondary | ICD-10-CM | POA: Diagnosis not present

## 2020-11-22 DIAGNOSIS — I69351 Hemiplegia and hemiparesis following cerebral infarction affecting right dominant side: Secondary | ICD-10-CM | POA: Diagnosis not present

## 2020-11-22 DIAGNOSIS — S31811D Laceration without foreign body of right buttock, subsequent encounter: Secondary | ICD-10-CM | POA: Diagnosis not present

## 2020-11-22 DIAGNOSIS — Z952 Presence of prosthetic heart valve: Secondary | ICD-10-CM | POA: Diagnosis not present

## 2020-11-22 DIAGNOSIS — R1310 Dysphagia, unspecified: Secondary | ICD-10-CM | POA: Diagnosis not present

## 2020-11-23 DIAGNOSIS — S31811D Laceration without foreign body of right buttock, subsequent encounter: Secondary | ICD-10-CM | POA: Diagnosis not present

## 2020-11-23 DIAGNOSIS — R1310 Dysphagia, unspecified: Secondary | ICD-10-CM | POA: Diagnosis not present

## 2020-11-23 DIAGNOSIS — S31821D Laceration without foreign body of left buttock, subsequent encounter: Secondary | ICD-10-CM | POA: Diagnosis not present

## 2020-11-23 DIAGNOSIS — I69391 Dysphagia following cerebral infarction: Secondary | ICD-10-CM | POA: Diagnosis not present

## 2020-11-23 DIAGNOSIS — I10 Essential (primary) hypertension: Secondary | ICD-10-CM | POA: Diagnosis not present

## 2020-11-23 DIAGNOSIS — I69351 Hemiplegia and hemiparesis following cerebral infarction affecting right dominant side: Secondary | ICD-10-CM | POA: Diagnosis not present

## 2020-11-25 DIAGNOSIS — I69351 Hemiplegia and hemiparesis following cerebral infarction affecting right dominant side: Secondary | ICD-10-CM | POA: Diagnosis not present

## 2020-11-25 DIAGNOSIS — S31811D Laceration without foreign body of right buttock, subsequent encounter: Secondary | ICD-10-CM | POA: Diagnosis not present

## 2020-11-25 DIAGNOSIS — S31821D Laceration without foreign body of left buttock, subsequent encounter: Secondary | ICD-10-CM | POA: Diagnosis not present

## 2020-11-25 DIAGNOSIS — R1310 Dysphagia, unspecified: Secondary | ICD-10-CM | POA: Diagnosis not present

## 2020-11-25 DIAGNOSIS — I69391 Dysphagia following cerebral infarction: Secondary | ICD-10-CM | POA: Diagnosis not present

## 2020-11-25 DIAGNOSIS — I10 Essential (primary) hypertension: Secondary | ICD-10-CM | POA: Diagnosis not present

## 2020-11-26 DIAGNOSIS — S31811D Laceration without foreign body of right buttock, subsequent encounter: Secondary | ICD-10-CM | POA: Diagnosis not present

## 2020-11-26 DIAGNOSIS — R1310 Dysphagia, unspecified: Secondary | ICD-10-CM | POA: Diagnosis not present

## 2020-11-26 DIAGNOSIS — I10 Essential (primary) hypertension: Secondary | ICD-10-CM | POA: Diagnosis not present

## 2020-11-26 DIAGNOSIS — S31821D Laceration without foreign body of left buttock, subsequent encounter: Secondary | ICD-10-CM | POA: Diagnosis not present

## 2020-11-26 DIAGNOSIS — I69351 Hemiplegia and hemiparesis following cerebral infarction affecting right dominant side: Secondary | ICD-10-CM | POA: Diagnosis not present

## 2020-11-26 DIAGNOSIS — I69391 Dysphagia following cerebral infarction: Secondary | ICD-10-CM | POA: Diagnosis not present

## 2020-11-30 DIAGNOSIS — R1310 Dysphagia, unspecified: Secondary | ICD-10-CM | POA: Diagnosis not present

## 2020-11-30 DIAGNOSIS — I69351 Hemiplegia and hemiparesis following cerebral infarction affecting right dominant side: Secondary | ICD-10-CM | POA: Diagnosis not present

## 2020-11-30 DIAGNOSIS — I69391 Dysphagia following cerebral infarction: Secondary | ICD-10-CM | POA: Diagnosis not present

## 2020-11-30 DIAGNOSIS — S31811D Laceration without foreign body of right buttock, subsequent encounter: Secondary | ICD-10-CM | POA: Diagnosis not present

## 2020-11-30 DIAGNOSIS — S31821D Laceration without foreign body of left buttock, subsequent encounter: Secondary | ICD-10-CM | POA: Diagnosis not present

## 2020-11-30 DIAGNOSIS — I10 Essential (primary) hypertension: Secondary | ICD-10-CM | POA: Diagnosis not present

## 2020-12-04 DIAGNOSIS — S31821D Laceration without foreign body of left buttock, subsequent encounter: Secondary | ICD-10-CM | POA: Diagnosis not present

## 2020-12-04 DIAGNOSIS — I69351 Hemiplegia and hemiparesis following cerebral infarction affecting right dominant side: Secondary | ICD-10-CM | POA: Diagnosis not present

## 2020-12-04 DIAGNOSIS — I10 Essential (primary) hypertension: Secondary | ICD-10-CM | POA: Diagnosis not present

## 2020-12-04 DIAGNOSIS — S31811D Laceration without foreign body of right buttock, subsequent encounter: Secondary | ICD-10-CM | POA: Diagnosis not present

## 2020-12-04 DIAGNOSIS — R1310 Dysphagia, unspecified: Secondary | ICD-10-CM | POA: Diagnosis not present

## 2020-12-04 DIAGNOSIS — I69391 Dysphagia following cerebral infarction: Secondary | ICD-10-CM | POA: Diagnosis not present

## 2020-12-07 DIAGNOSIS — R1310 Dysphagia, unspecified: Secondary | ICD-10-CM | POA: Diagnosis not present

## 2020-12-07 DIAGNOSIS — S31821D Laceration without foreign body of left buttock, subsequent encounter: Secondary | ICD-10-CM | POA: Diagnosis not present

## 2020-12-07 DIAGNOSIS — I10 Essential (primary) hypertension: Secondary | ICD-10-CM | POA: Diagnosis not present

## 2020-12-07 DIAGNOSIS — I69351 Hemiplegia and hemiparesis following cerebral infarction affecting right dominant side: Secondary | ICD-10-CM | POA: Diagnosis not present

## 2020-12-07 DIAGNOSIS — I69391 Dysphagia following cerebral infarction: Secondary | ICD-10-CM | POA: Diagnosis not present

## 2020-12-07 DIAGNOSIS — S31811D Laceration without foreign body of right buttock, subsequent encounter: Secondary | ICD-10-CM | POA: Diagnosis not present

## 2020-12-09 DIAGNOSIS — R1310 Dysphagia, unspecified: Secondary | ICD-10-CM | POA: Diagnosis not present

## 2020-12-09 DIAGNOSIS — I69391 Dysphagia following cerebral infarction: Secondary | ICD-10-CM | POA: Diagnosis not present

## 2020-12-09 DIAGNOSIS — S31811D Laceration without foreign body of right buttock, subsequent encounter: Secondary | ICD-10-CM | POA: Diagnosis not present

## 2020-12-09 DIAGNOSIS — I69351 Hemiplegia and hemiparesis following cerebral infarction affecting right dominant side: Secondary | ICD-10-CM | POA: Diagnosis not present

## 2020-12-09 DIAGNOSIS — S31821D Laceration without foreign body of left buttock, subsequent encounter: Secondary | ICD-10-CM | POA: Diagnosis not present

## 2020-12-09 DIAGNOSIS — I10 Essential (primary) hypertension: Secondary | ICD-10-CM | POA: Diagnosis not present

## 2020-12-12 ENCOUNTER — Other Ambulatory Visit: Payer: Self-pay | Admitting: Internal Medicine

## 2020-12-17 DIAGNOSIS — S31821D Laceration without foreign body of left buttock, subsequent encounter: Secondary | ICD-10-CM | POA: Diagnosis not present

## 2020-12-17 DIAGNOSIS — I69391 Dysphagia following cerebral infarction: Secondary | ICD-10-CM | POA: Diagnosis not present

## 2020-12-17 DIAGNOSIS — I69351 Hemiplegia and hemiparesis following cerebral infarction affecting right dominant side: Secondary | ICD-10-CM | POA: Diagnosis not present

## 2020-12-17 DIAGNOSIS — I10 Essential (primary) hypertension: Secondary | ICD-10-CM | POA: Diagnosis not present

## 2020-12-17 DIAGNOSIS — S31811D Laceration without foreign body of right buttock, subsequent encounter: Secondary | ICD-10-CM | POA: Diagnosis not present

## 2020-12-17 DIAGNOSIS — R1310 Dysphagia, unspecified: Secondary | ICD-10-CM | POA: Diagnosis not present

## 2020-12-23 ENCOUNTER — Ambulatory Visit: Payer: Medicare Other | Admitting: Physical Medicine & Rehabilitation

## 2020-12-30 ENCOUNTER — Encounter: Payer: Medicare Other | Admitting: Physical Medicine & Rehabilitation

## 2020-12-31 ENCOUNTER — Other Ambulatory Visit: Payer: Self-pay

## 2020-12-31 ENCOUNTER — Ambulatory Visit (INDEPENDENT_AMBULATORY_CARE_PROVIDER_SITE_OTHER): Payer: Medicare Other | Admitting: Internal Medicine

## 2020-12-31 ENCOUNTER — Encounter: Payer: Self-pay | Admitting: Internal Medicine

## 2020-12-31 VITALS — BP 110/62 | HR 63 | Temp 98.1°F | Ht 71.0 in | Wt 183.0 lb

## 2020-12-31 DIAGNOSIS — E785 Hyperlipidemia, unspecified: Secondary | ICD-10-CM

## 2020-12-31 DIAGNOSIS — G8111 Spastic hemiplegia affecting right dominant side: Secondary | ICD-10-CM | POA: Diagnosis not present

## 2020-12-31 DIAGNOSIS — R739 Hyperglycemia, unspecified: Secondary | ICD-10-CM | POA: Diagnosis not present

## 2020-12-31 DIAGNOSIS — R7989 Other specified abnormal findings of blood chemistry: Secondary | ICD-10-CM | POA: Diagnosis not present

## 2020-12-31 DIAGNOSIS — I2510 Atherosclerotic heart disease of native coronary artery without angina pectoris: Secondary | ICD-10-CM | POA: Diagnosis not present

## 2020-12-31 DIAGNOSIS — I10 Essential (primary) hypertension: Secondary | ICD-10-CM | POA: Diagnosis not present

## 2020-12-31 DIAGNOSIS — F418 Other specified anxiety disorders: Secondary | ICD-10-CM | POA: Diagnosis not present

## 2020-12-31 LAB — CBC WITH DIFFERENTIAL/PLATELET
Basophils Absolute: 0 10*3/uL (ref 0.0–0.1)
Basophils Relative: 0.3 % (ref 0.0–3.0)
Eosinophils Absolute: 0.2 10*3/uL (ref 0.0–0.7)
Eosinophils Relative: 2.4 % (ref 0.0–5.0)
HCT: 44.7 % (ref 39.0–52.0)
Hemoglobin: 14.9 g/dL (ref 13.0–17.0)
Lymphocytes Relative: 23.6 % (ref 12.0–46.0)
Lymphs Abs: 1.7 10*3/uL (ref 0.7–4.0)
MCHC: 33.3 g/dL (ref 30.0–36.0)
MCV: 94.3 fl (ref 78.0–100.0)
Monocytes Absolute: 0.6 10*3/uL (ref 0.1–1.0)
Monocytes Relative: 9.1 % (ref 3.0–12.0)
Neutro Abs: 4.6 10*3/uL (ref 1.4–7.7)
Neutrophils Relative %: 64.6 % (ref 43.0–77.0)
Platelets: 133 10*3/uL — ABNORMAL LOW (ref 150.0–400.0)
RBC: 4.74 Mil/uL (ref 4.22–5.81)
RDW: 13.7 % (ref 11.5–15.5)
WBC: 7.1 10*3/uL (ref 4.0–10.5)

## 2020-12-31 LAB — COMPREHENSIVE METABOLIC PANEL
ALT: 8 U/L (ref 0–53)
AST: 12 U/L (ref 0–37)
Albumin: 3.7 g/dL (ref 3.5–5.2)
Alkaline Phosphatase: 63 U/L (ref 39–117)
BUN: 18 mg/dL (ref 6–23)
CO2: 31 mEq/L (ref 19–32)
Calcium: 9.4 mg/dL (ref 8.4–10.5)
Chloride: 103 mEq/L (ref 96–112)
Creatinine, Ser: 1.23 mg/dL (ref 0.40–1.50)
GFR: 49.94 mL/min — ABNORMAL LOW (ref >60.00)
Glucose, Bld: 99 mg/dL (ref 70–99)
Potassium: 4 mEq/L (ref 3.5–5.1)
Sodium: 140 mEq/L (ref 135–145)
Total Bilirubin: 0.5 mg/dL (ref 0.2–1.2)
Total Protein: 7 g/dL (ref 6.0–8.3)

## 2020-12-31 LAB — HEMOGLOBIN A1C: Hgb A1c MFr Bld: 5.6 % (ref 4.6–6.5)

## 2020-12-31 LAB — LIPID PANEL
Cholesterol: 189 mg/dL (ref 0–200)
HDL: 45.6 mg/dL (ref >39.00)
LDL Cholesterol: 129 mg/dL — ABNORMAL HIGH (ref 0–99)
NonHDL: 143.75
Total CHOL/HDL Ratio: 4
Triglycerides: 73 mg/dL (ref 0.0–149.0)
VLDL: 14.6 mg/dL (ref 0.0–40.0)

## 2020-12-31 LAB — TSH: TSH: 2.35 u[IU]/mL (ref 0.35–5.50)

## 2020-12-31 MED ORDER — CLOTRIMAZOLE-BETAMETHASONE 1-0.05 % EX CREA: 1.0000 "application " | TOPICAL_CREAM | Freq: Two times a day (BID) | CUTANEOUS | 1 refills | Status: DC

## 2020-12-31 NOTE — Patient Instructions (Signed)
Wt Readings from Last 3 Encounters:  12/31/20 183 lb (83 kg)  09/28/20 190 lb 6.4 oz (86.4 kg)  02/19/20 190 lb (86.2 kg)

## 2020-12-31 NOTE — Progress Notes (Signed)
Subjective:  Patient ID: David [name], male    DOB: 29-Jun-1923  Age: 87 y.o. MRN: [id number]  CC: Follow-up (3 month f/u- Req refill on the Lotrisone cream)   HPI David [name] presents for R hemiparesis, HTN, CAD f/u.  He is here with his family  Outpatient Medications Prior to Visit  Medication Sig Dispense Refill   acetaminophen (TYLENOL) 325 MG tablet Take 1-2 tablets (325-650 mg total) by mouth every 4 (four) hours as needed for mild pain.     albuterol (PROVENTIL) 2 MG tablet Take 1 tablet (2 mg total) by mouth 2 (two) times daily as needed for wheezing. Cough 60 tablet 3   atenolol (TENORMIN) 25 MG tablet Take 1 tablet (25 mg total) by mouth daily. (Patient taking differently: Take 12.5 mg by mouth daily.) 30 tablet 0   clotrimazole-betamethasone (LOTRISONE) cream Apply 1 application topically 2 (two) times daily. 90 g 1   escitalopram (LEXAPRO) 10 MG tablet Take 1 tablet (10 mg total) by mouth daily. 90 tablet 3   ezetimibe (ZETIA) 10 MG tablet Take 1 tablet (10 mg total) by mouth daily. 30 tablet 0   lactulose (CHRONULAC) 10 GM/15ML solution 30-60 ml bid po prn 1000 mL 5   pantoprazole (PROTONIX) 40 MG tablet TAKE 1 TABLET BY MOUTH EVERY DAY 90 tablet 3   polyethylene glycol (MIRALAX / GLYCOLAX) 17 g packet Take 17 g by mouth daily.     senna-docusate (SENOKOT-S) 8.6-50 MG tablet Take 2 tablets by mouth 2 (two) times daily. 60 tablet 0   No facility-administered medications prior to visit.    ROS: Review of Systems  Constitutional:  Negative for appetite change, fatigue and unexpected weight change.  HENT:  Negative for congestion, nosebleeds, sneezing, sore throat and trouble swallowing.   Eyes:  Negative for itching and visual disturbance.  Respiratory:  Negative for cough.   Cardiovascular:  Negative for chest pain, palpitations and leg swelling.  Gastrointestinal:  Negative for abdominal distention, blood in stool, diarrhea and nausea.  Genitourinary:  Negative for  frequency and hematuria.  Musculoskeletal:  Positive for arthralgias, back pain and gait problem. Negative for joint swelling and neck pain.  Skin:  Negative for rash.  Neurological:  Positive for weakness. Negative for dizziness, tremors and speech difficulty.  Hematological:  Bruises/bleeds easily.  Psychiatric/Behavioral:  Positive for decreased concentration. Negative for agitation, dysphoric mood, sleep disturbance and suicidal ideas. The patient is not nervous/anxious.    Objective:  BP 110/62 (BP Location: Left Arm)   Pulse 63   Temp 98.1 F (36.7 C) (Oral)   Ht '5\' 11"'$  (1.803 m)   Wt 183 lb (83 kg)   SpO2 96%   BMI 25.52 kg/m   BP Readings from Last 3 Encounters:  12/31/20 110/62  09/28/20 120/68  08/19/20 108/66    Wt Readings from Last 3 Encounters:  12/31/20 183 lb (83 kg)  09/28/20 190 lb 6.4 oz (86.4 kg)  02/19/20 190 lb (86.2 kg)    Physical Exam Constitutional:      General: He is not in acute distress.    Appearance: He is well-developed.     Comments: NAD  Eyes:     Conjunctiva/sclera: Conjunctivae normal.     Pupils: Pupils are equal, round, and reactive to light.  Neck:     Thyroid: No thyromegaly.     Vascular: No JVD.  Cardiovascular:     Rate and Rhythm: Normal rate and regular rhythm.  Heart sounds: Normal heart sounds. No murmur heard.   No friction rub. No gallop.  Pulmonary:     Effort: Pulmonary effort is normal. No respiratory distress.     Breath sounds: Normal breath sounds. No wheezing or rales.  Chest:     Chest wall: No tenderness.  Abdominal:     General: Bowel sounds are normal. There is no distension.     Palpations: Abdomen is soft. There is no mass.     Tenderness: There is no abdominal tenderness. There is no guarding or rebound.  Musculoskeletal:        General: No tenderness. Normal range of motion.     Cervical back: Normal range of motion.  Lymphadenopathy:     Cervical: No cervical adenopathy.  Skin:    General:  Skin is warm and dry.     Findings: No rash.  Neurological:     Mental Status: He is alert and oriented to person, place, and time.     Cranial Nerves: No cranial nerve deficit.     Motor: Weakness present. No abnormal muscle tone.     Coordination: Coordination abnormal.     Gait: Gait abnormal.     Deep Tendon Reflexes: Reflexes are normal and symmetric.  Psychiatric:        Behavior: Behavior normal.        Thought Content: Thought content normal.        Judgment: Judgment normal.  R hemiparesis The patient is in a wheelchair Is a little dysarthric Alert and cooperative  Lab Results  Component Value Date   WBC 7.6 09/28/2020   HGB 15.5 09/28/2020   HCT 45.6 09/28/2020   PLT 132.0 (L) 09/28/2020   GLUCOSE 117 (H) 09/28/2020   CHOL 189 01/02/2020   TRIG 125 01/02/2020   HDL 43 01/02/2020   LDLDIRECT 136.2 08/01/2007   LDLCALC 122 (H) 01/02/2020   ALT 8 09/28/2020   AST 13 09/28/2020   NA 139 09/28/2020   K 4.4 09/28/2020   CL 103 09/28/2020   CREATININE 1.24 09/28/2020   BUN 21 09/28/2020   CO2 29 09/28/2020   TSH 2.32 09/28/2020   PSA 4.88 (H) 05/20/2019   INR 1.2 09/06/2019   HGBA1C 5.9 09/28/2020    DG Chest 2 View  Result Date: 09/10/2019 CLINICAL DATA:  Fever EXAM: CHEST - 2 VIEW COMPARISON:  09/04/2019 FINDINGS: Prior CABG and aortic valve replacement. The heart size and mediastinal contours are stable. Low lung volumes. Increased interstitial markings within the left lung base, slightly progressed from prior. No pneumothorax. IMPRESSION: Increased interstitial markings within the left lung base, slightly progressed from prior. Findings could represent atelectasis versus pneumonia. Electronically Signed   By: Davina Poke D.O.   On: 09/10/2019 16:33   DG Abd 1 View  Result Date: 09/10/2019 CLINICAL DATA:  Abdominal distension. EXAM: ABDOMEN - 1 VIEW COMPARISON:  None. FINDINGS: The bowel gas pattern is normal. Residual contrast is noted in the colon. No  radio-opaque calculi or other significant radiographic abnormality are seen. IMPRESSION: No evidence of bowel obstruction or ileus. Electronically Signed   By: Marijo Conception M.D.   On: 09/10/2019 16:32   CT HEAD WO CONTRAST  Result Date: 09/10/2019 CLINICAL DATA:  Follow-up left basal ganglia hemorrhage. Increased lethargy. EXAM: CT HEAD WITHOUT CONTRAST TECHNIQUE: Contiguous axial images were obtained from the base of the skull through the vertex without intravenous contrast. COMPARISON:  09/04/2019 FINDINGS: Brain: Slight enlargement of the left basal ganglia  intraparenchymal hemorrhage, measuring 4.4 x 2.5 x 3.6 cm (volume = 21 cm^3) today compared with 16 cc volume 6 days ago. Additionally, there is intraventricular penetration, having breach the atrium of the left lateral ventricle, with a small amount of blood dependent in both occipital horns. Ventricular size remains stable however without hydrocephalus. Surrounding edema is slightly more pronounced. One could question if there has been extension of infarction more anteriorly within the basal ganglia and external capsule or if this relates to edema from the hemorrhage. Other small-vessel ischemic changes throughout brain appear stable. No new large vessel infarction. No extra-axial collection. Vascular: There is atherosclerotic calcification of the major vessels at the base of the brain. Skull: Negative Sinuses/Orbits: Clear/normal Other: None IMPRESSION: Slight enlargement of the left basal ganglia intraparenchymal hemorrhage when compared to the study of 6 days ago. Previous volume was calculated at 16 cc. Today calculated at 21 cc. There is also intraventricular penetration with a small amount of blood dependent in the occipital horns of both lateral ventricles but no hydrocephalus. Slightly more surrounding edema as expected. Some edema extends anterior into the basal ganglia and external capsule region, which could either be extension of edema or  slight extension of the infarction itself. These results will be called to the ordering clinician or representative by the Radiologist Assistant, and communication documented in the PACS or [employer]. Electronically Signed   By: Nelson Chimes M.D.   On: 09/10/2019 16:57    Assessment & Plan:     Walker Kehr, MD

## 2021-01-04 NOTE — Assessment & Plan Note (Signed)
Continue to monitor LFTs 

## 2021-01-04 NOTE — Assessment & Plan Note (Signed)
Continue with atenolol

## 2021-01-04 NOTE — Assessment & Plan Note (Signed)
Continue with PT at home   

## 2021-01-26 ENCOUNTER — Telehealth: Payer: Self-pay | Admitting: Lab

## 2021-01-26 NOTE — Chronic Care Management (AMB) (Signed)
  Chronic Care Management   Note  01/26/2021 Name: David [name] MRN: [id number] DOB: [birth year]  David [name] is a 87 y.o. year old male who is a primary care patient of Plotnikov, [name], MD. I reached out to Stefano Gaul by phone today in response to a referral sent by [name]'s PCP, Plotnikov, [name], MD.   [name] was given information about Chronic Care Management services today including:  CCM service includes personalized support from designated clinical staff supervised by his physician, including individualized plan of care and coordination with other care providers 24/7 contact phone numbers for assistance for urgent and routine care needs. Service will only be billed when office clinical staff spend 20 minutes or more in a month to coordinate care. Only one practitioner may furnish and bill the service in a calendar month. The patient may stop CCM services at any time (effective at the end of the month) by phone call to the office staff.   Patient agreed to services and verbal consent obtained.   Follow up plan: [place]

## 2021-02-03 ENCOUNTER — Encounter: Payer: Medicare Other | Attending: Physical Medicine & Rehabilitation | Admitting: Physical Medicine & Rehabilitation

## 2021-03-17 ENCOUNTER — Telehealth: Payer: Self-pay

## 2021-03-17 NOTE — Progress Notes (Signed)
    Chronic Care Management Pharmacy Assistant   Name: David [name]  MRN: [id number] DOB: 1923/06/02   Reason for Encounter: Initial Chart Prep   Recent office visits:  09/28/20 Plotnikov, [name], MD-PCP (Cough) orders: labs Medication Changes: albuterol (PROVENTIL) 2 MG tablet  Recent consult visits:  None ID  Hospital visits:  None in previous 6 months  Medications: Outpatient Encounter Medications as of 03/17/2021  Medication Sig   acetaminophen (TYLENOL) 325 MG tablet Take 1-2 tablets (325-650 mg total) by mouth every 4 (four) hours as needed for mild pain.   albuterol (PROVENTIL) 2 MG tablet Take 1 tablet (2 mg total) by mouth 2 (two) times daily as needed for wheezing. Cough   atenolol (TENORMIN) 25 MG tablet Take 1 tablet (25 mg total) by mouth daily. (Patient taking differently: Take 12.5 mg by mouth daily.)   clotrimazole-betamethasone (LOTRISONE) cream Apply 1 application topically 2 (two) times daily.   escitalopram (LEXAPRO) 10 MG tablet Take 1 tablet (10 mg total) by mouth daily.   ezetimibe (ZETIA) 10 MG tablet Take 1 tablet by mouth daily.   lactulose (CHRONULAC) 10 GM/15ML solution 30-60 ml bid po prn   pantoprazole (PROTONIX) 40 MG tablet TAKE 1 TABLET BY MOUTH EVERY DAY   polyethylene glycol (MIRALAX / GLYCOLAX) 17 g packet Take 17 g by mouth daily.   senna-docusate (SENOKOT-S) 8.6-50 MG tablet Take 2 tablets by mouth 2 (two) times daily.   No facility-administered encounter medications on file as of 03/17/2021.   Medication List: acetaminophen (TYLENOL) 325 MG tablet albuterol (PROVENTIL) 2 MG tablet atenolol (TENORMIN) 25 MG tablet-last fill 05/19/20 90 ds clotrimazole-betamethasone (LOTRISONE) cream-last fill 12/31/20 90 ds escitalopram (LEXAPRO) 10 MG tablet-last fill 03/06/21 90 ds ezetimibe (ZETIA) 10 MG tablet-last fill 12/20/20 30 ds lactulose (CHRONULAC) 10 GM/15ML solution-last fill 05/05/20 11 ds pantoprazole (PROTONIX) 40 MG tablet-last fill  03/06/21 90 ds polyethylene glycol (MIRALAX / GLYCOLAX) 17 g packet senna-docusate (SENOKOT-S) 8.6-50 MG tablet   Care Gaps: Colonoscopy-NA Diabetic Foot Exam-NA Ophthalmology-NA Dexa Scan -NA  Annual Well Visit - NA Micro albumin-NA Hemoglobin A1c- 12/31/20  Star Rating Drugs: None ID  Ethelene Hal Clinical Pharmacist Assistant [phone]

## 2021-03-19 ENCOUNTER — Ambulatory Visit (INDEPENDENT_AMBULATORY_CARE_PROVIDER_SITE_OTHER): Payer: Medicare Other

## 2021-03-19 ENCOUNTER — Ambulatory Visit: Payer: Medicare Other

## 2021-03-19 ENCOUNTER — Other Ambulatory Visit: Payer: Self-pay

## 2021-03-19 DIAGNOSIS — K219 Gastro-esophageal reflux disease without esophagitis: Secondary | ICD-10-CM

## 2021-03-19 DIAGNOSIS — E785 Hyperlipidemia, unspecified: Secondary | ICD-10-CM

## 2021-03-19 DIAGNOSIS — I10 Essential (primary) hypertension: Secondary | ICD-10-CM

## 2021-03-19 DIAGNOSIS — I2510 Atherosclerotic heart disease of native coronary artery without angina pectoris: Secondary | ICD-10-CM

## 2021-03-19 NOTE — Patient Instructions (Signed)
David [name],  It was great to talk to you today!  Please call me with any questions or concerns.  Visit Information   PATIENT GOALS/PLAN OF CARE:  Care Plan : CCM Care Plan  Updates made by David [name], RPH since 03/19/2021 12:00 AM     Problem: Hypertension, Hyperlipidemia, GERD, and Anxiety   Priority: High  Onset Date: 03/19/2021     Long-Range Goal: Disease Management   Start Date: 03/19/2021  Expected End Date: 03/19/2022  This Visit's Progress: On track  Priority: High  Note:   Current Barriers:  Unable to independently monitor therapeutic efficacy  Pharmacist Clinical Goal(s):  Patient will achieve improvement in LDL as evidenced by next lipid panel through collaboration with PharmD and provider.   Interventions: 1:1 collaboration with David [name], David Lacks, MD regarding development and update of comprehensive plan of care as evidenced by provider attestation and co-signature Inter-disciplinary care team collaboration (see longitudinal plan of care) Comprehensive medication review performed; medication list updated in electronic medical record  Hypertension (BP goal <140/90) -Controlled -Current treatment: Atenolol 59m - 1/2 tablet daily  -Medications previously tried: lisinopril, losartan, furosemide  -Current home readings: daughter reports that blood pressures have all been at goal when checked at therapy  BP Readings from Last 3 Encounters:  12/31/20 110/62  09/28/20 120/68  08/19/20 108/66  -Current dietary habits: follows sodium reduced diet  -Current exercise habits: PT once weekly - plans to increase to 2 times weekly  -Denies hypotensive/hypertensive symptoms -Educated on BP goals and benefits of medications for prevention of heart attack, stroke and kidney damage; Daily salt intake goal < 2300 mg; Importance of home blood pressure monitoring; Proper BP monitoring technique; Symptoms of hypotension and importance of maintaining adequate  hydration; -Counseled to monitor BP at home at least once weekly, document, and provide log at future appointments -Counseled on diet and exercise extensively Recommended to continue current medication  Hyperlipidemia: (LDL goal < 70) -Not ideally controlled Lab Results  Component Value Date   LDLCALC 129 (H) 12/31/2020  -Current treatment: Ezetimibe 122m- 1 tablet daily  ASA 32545m 1 tablet daily  -Medications previously tried: atorvastatin, simvastsatin, colesevelam  -Current dietary patterns: daughter reports that she closely monitors diet, patient eating a low cholesterol diet -Current exercise habits: PT once weekly - plans to increase to 2 times weekly  -Educated on Cholesterol goals;  Importance of limiting foods high in cholesterol; -Recommended for patient to trial PSCK9 as he is statin intolerant and has a history of stroke - daughter agreeable if PCP is also agreeable  Situational Anxiety (Goal: prevention of anxiety attacks) -Controlled -Current treatment: Escitalopram 44m13m1 tablet daily  -Medications previously tried/failed: n/a -GAD7: unable to be assessed - reviewed medications with daughter  -Educated on Benefits of medication for symptom control Benefits of cognitive-behavioral therapy with or without medication -Recommended to continue current medication   GERD (Goal: Prevention of acid reflux) -Controlled -Current treatment  Pantoprazole 40mg74m tablet daily  -Medications previously tried: n/a  -Counseled on diet and exercise extensively Recommended to continue current medication  Health Maintenance -Vaccine gaps: shingles, COVID booster, and influenza vaccine -Current therapy:  Lactulose 10g/15mL 20m-60mL t34m daily as needed  Senna-docusate 8.6-50mg - 2 tablets daily  APAP 325mg - 86mtablets every 4 hours as needed  Lotrisone cream - applied twice daily  Miralax - 17g daily Lion's Mane - 2 capsules daily  -Educated on Cost vs benefit of  each product  must be carefully weighed by individual consumer -Patient is satisfied with current therapy and denies issues -Recommended to continue current medication  Patient Goals/Self-Care Activities Patient will:  - take medications as prescribed as evidenced by patient report and record review check blood pressure at least once weekly, document, and provide at future appointments  Follow Up Plan: Telephone follow up appointment with care management team member scheduled for: 3 months The patient has been provided with contact information for the care management team and has been advised to call with any health related questions or concerns.     Consent to CCM Services: David [name] was given information about Chronic Care Management services including:  CCM service includes personalized support from designated clinical staff supervised by his physician, including individualized plan of care and coordination with other care providers 24/7 contact phone numbers for assistance for urgent and routine care needs. Service will only be billed when office clinical staff spend 20 minutes or more in a month to coordinate care. Only one practitioner may furnish and bill the service in a calendar month. The patient may stop CCM services at any time (effective at the end of the month) by phone call to the office staff. The patient will be responsible for cost sharing (co-pay) of up to 20% of the service fee (after annual deductible is met).  Patient agreed to services and verbal consent obtained.   Patient verbalizes understanding of instructions provided today and agrees to view in David [name].   Telephone follow up appointment with care management team member scheduled for: 3 months The patient has been provided with contact information for the care management team and has been advised to call with any health related questions or concerns.   David [name], PharmD Clinical Pharmacist, [place]

## 2021-03-19 NOTE — Progress Notes (Addendum)
Chronic [name] Management Pharmacy Note  03/19/2021 Name:  David [name] MRN:  [id number] DOB:  Jan 17, 1924  Summary: -Spoke with patient's daughter Narda Rutherford about patient's medications, she notes that since reducing atenolol to 12.26m daily BP has been much better controlled, no issues with hypotension -Patient still has cough on occasion, no issues with breathing, patient was not able to start albuterol tablets previously prescribed due to cost, interested in albuterol inhaler  -Daughter notes that escitalopram is effective at controlling patient's mental health, denies any issues or concerns at this time, aware she can reach out to office if this changes -LDL not ideally controlled, patient noted to be statin intolerant, eats a low cholesterol diet, noted that he is also going to physical therapy once weekly, planning to increase to twice daily in the near future  Recommendations/Changes made from today's visit: - Recommending for patient to start repatha 147mevery 2 weeks to help bring LDL to goal / lower risk of heart attack or stroke  -Also recommending for patient to have albuterol 10828mact inhaler prescribed to be used as needed for cough / wheezing per daughter's request   Subjective: David [name] an [age]. year old male who is a primary patient of Plotnikov, AleEvie LacksD.  The CCM team was consulted for assistance with disease management and [name] coordination needs.    Engaged with patient by telephone for initial visit in response to provider referral for pharmacy case management and/or [name] coordination services.   Consent to Services:  The patient was given the following information about Chronic [name] Management services today, agreed to services, and gave verbal consent: 1. CCM service includes personalized support from designated clinical staff supervised by the primary [name] provider, including individualized plan of [name] and coordination with other [name] providers 2. 24/7  contact phone numbers for assistance for urgent and routine [name] needs. 3. Service will only be billed when office clinical staff spend 20 minutes or more in a month to coordinate [name]. 4. Only one practitioner may furnish and bill the service in a calendar month. 5.The patient may stop CCM services at any time (effective at the end of the month) by phone call to the office staff. 6. The patient will be responsible for cost sharing (co-pay) of up to 20% of the service fee (after annual deductible is met). Patient agreed to services and consent obtained.  Patient [name] Team: Plotnikov, AleEvie LacksD as PCP - General (Internal Medicine) David [name] as PCP - Cardiology (Cardiology) David [name] as Consulting Physician (Physical Medicine and Rehabilitation) [facility] [place] Consulting Physician (Optometry) David BisonanDarnelle MaffucciPHEye [facility] Pharmacist (Pharmacist)  Recent office visits: 12/31/2020 - Dr. PloAlain Marionf/ u - no changes to medications - f/u in 4 months  09/28/2020 - Dr. PloAlain Marionevaluation of cough - start albuterol 2mg31m needed  [facility]: [facility] - no changes - f/u in 1 year   Hospital visits: None in previous 6 months  Objective:  Lab Results  Component Value Date   CREATININE 1.23 12/31/2020   BUN 18 12/31/2020   GFR 49.94 ([name]) 12/31/2020   GFRNONAA 57 ([name]) 01/29/2020   GFRAA 66 01/29/2020   NA 140 12/31/2020   K 4.0 12/31/2020   CALCIUM 9.4 12/31/2020   CO2 31 12/31/2020   GLUCOSE 99 12/31/2020    Lab Results  Component Value Date/Time   HGBA1C 5.6 12/31/2020  11:56 AM   HGBA1C 5.9 09/28/2020 11:29 AM   GFR 49.94 ([name]) 12/31/2020 11:56 AM   GFR 49.55 ([name]) 09/28/2020 11:29 AM    Last diabetic Eye exam:  No results found for: HMDIABEYEEXA  Last diabetic Foot exam:  No results found for: HMDIABFOOTEX   Lab Results  Component Value Date   CHOL 189 12/31/2020   HDL 45.60 12/31/2020   LDLCALC 129 (H)  12/31/2020   LDLDIRECT 136.2 08/01/2007   TRIG 73.0 12/31/2020   CHOLHDL 4 12/31/2020    Hepatic Function Latest Ref Rng & Units 12/31/2020 09/28/2020 03/30/2020  Total Protein 6.0 - 8.3 g/dL 7.0 7.1 6.8  Albumin 3.5 - 5.2 g/dL 3.7 4.0 3.7  AST 0 - 37 U/[name] _0 ALT 0 - 53 U/[name] _1 Alk Phosphatase 39 - 117 U/[name] 63 67 60  Total Bilirubin 0.2 - 1.2 mg/dL 0.5 0.5 0.7  Bilirubin, Direct 0.0 - 0.3 mg/dL - - -    Lab Results  Component Value Date/Time   TSH 2.35 12/31/2020 11:56 AM   TSH 2.32 09/28/2020 11:29 AM    CBC Latest Ref Rng & Units 12/31/2020 09/28/2020 03/30/2020  WBC 4.0 - 10.5 K/uL 7.1 7.6 8.3  Hemoglobin 13.0 - 17.0 g/dL 14.9 15.5 15.8  Hematocrit 39.0 - 52.0 % 44.7 45.6 47.0  Platelets 150.0 - 400.0 K/uL 133.0([name]) 132.0([name]) 122.0([name])    No results found for: VD25OH  Clinical ASCVD: Yes  The ASCVD Risk score (Arnett DK, et al., 2019) failed to calculate for the following reasons:   The 2019 ASCVD risk score is only valid for ages 28 to 51   The patient has a prior MI or stroke diagnosis    Depression screen [facility] 2/9 06/29/2020 06/05/2020 02/19/2020  Decreased Interest 0 0 0  Down, Depressed, Hopeless 0 0 0  PHQ - 2 Score 0 0 0  Altered sleeping 0 - -  Tired, decreased energy 0 - -  Change in appetite 0 - -  Feeling bad or failure about yourself  0 - -  Trouble concentrating 0 - -  Moving slowly or fidgety/restless 0 - -  Suicidal thoughts 0 - -  PHQ-9 Score 0 - -  Difficult doing work/chores - - -    Social History   Tobacco Use  Smoking Status Former   Types: Pipe   Quit date: 05/16/1998   Years since quitting: 22.8  Smokeless Tobacco Former   BP Readings from Last 3 Encounters:  12/31/20 110/62  09/28/20 120/68  08/19/20 108/66   Pulse Readings from Last 3 Encounters:  12/31/20 63  09/28/20 (!) 59  08/19/20 61   Wt Readings from Last 3 Encounters:  12/31/20 183 lb (83 kg)  09/28/20 190 lb 6.4 oz (86.4 kg)  02/19/20 190 lb (86.2 kg)   BMI  Readings from Last 3 Encounters:  12/31/20 25.52 kg/m  09/28/20 26.56 kg/m  04/22/20 26.50 kg/m    Assessment/Interventions: Review of patient past medical history, allergies, medications, health status, including review of consultants reports, laboratory and other test data, was performed as part of comprehensive evaluation and provision of chronic [name] management services.   SDOH:  (Social Determinants of Health) assessments and interventions performed: Yes  SDOH Screenings   Alcohol Screen: Low Risk    Last Alcohol Screening Score (AUDIT): 0  Depression (PHQ2-9): Low Risk    PHQ-2 Score: 0  Financial Resource Strain: Low Risk    Difficulty of Paying Living Expenses:  Not hard at all  Food Insecurity: No Food Insecurity   Worried About Charity fundraiser in the Last Year: Never true   Ran Out of Food in the Last Year: Never true  Housing: Low Risk    Last Housing Risk Score: 0  Physical Activity: Sufficiently Active   Days of Exercise per Week: 3 days   Minutes of Exercise per Session: 50 min  Social Connections: Socially Isolated   Frequency of Communication with Friends and Family: More than three times a week   Frequency of Social Gatherings with Friends and Family: Once a week   Attends Religious Services: Never   Marine scientist or Organizations: No   Attends Archivist Meetings: Never   Marital Status: Widowed  Stress: No Stress Concern Present   Feeling of Stress : Not at all  Tobacco Use: Medium Risk   Smoking Tobacco Use: Former   Smokeless Tobacco Use: Former   Passive Exposure: Not on Pensions consultant Needs: No Transportation Needs   Film/video editor (Medical): No   Lack of Transportation (Non-Medical): No    CCM [name] Plan  Allergies  Allergen Reactions   Atorvastatin Other (See Comments)    Unknown reaction   Colesevelam Other (See Comments)    Unknown reaction   Simvastatin Other (See Comments)    Unknown reaction    Sulfa Antibiotics Other (See Comments)    Unknown reaction    Medications Reviewed Today     Reviewed by Cassandria Anger, MD (Physician) on 12/31/20 at 1148  Med List Status: <None>   Medication Order Taking? Sig Documenting Provider Last Dose Status Informant  acetaminophen (TYLENOL) 325 MG tablet [id number] Yes Take 1-2 tablets (325-650 mg total) by mouth every 4 (four) hours as needed for mild pain. Bary Leriche, PA-C Taking Active   albuterol (PROVENTIL) 2 MG tablet [id number] Yes Take 1 tablet (2 mg total) by mouth 2 (two) times daily as needed for wheezing. Cough Plotnikov, [name], MD Taking Active   atenolol (TENORMIN) 25 MG tablet [id number] Yes Take 1 tablet (25 mg total) by mouth daily.  Patient taking differently: Take 12.5 mg by mouth daily.   Ngetich, Nelda Bucks, NP Taking Active   clotrimazole-betamethasone (LOTRISONE) cream [id number] Yes Apply 1 application topically 2 (two) times daily. Plotnikov, [name], MD  Active   escitalopram (LEXAPRO) 10 MG tablet [id number] Yes Take 1 tablet (10 mg total) by mouth daily. Plotnikov, [name], MD Taking Active   ezetimibe (ZETIA) 10 MG tablet [id number] Yes Take 1 tablet (10 mg total) by mouth daily. Ngetich, Dinah C, NP Taking Active   lactulose (CHRONULAC) 10 GM/15ML solution [id number] Yes 30-60 ml bid po prn Plotnikov, [name], MD Taking Active   pantoprazole (PROTONIX) 40 MG tablet [id number] Yes TAKE 1 TABLET BY MOUTH EVERY DAY Plotnikov, [name], MD Taking Active   polyethylene glycol (MIRALAX / GLYCOLAX) 17 g packet [id number] Yes Take 17 g by mouth daily. [provider] Taking Active   senna-docusate (SENOKOT-S) 8.6-50 MG tablet [id number] Yes Take 2 tablets by mouth 2 (two) times daily. 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 10/17/2019   Depression, major, single episode, moderate (HCC) 10/17/2019   Cognitive and neurobehavioral dysfunction    Labile blood pressure    Dysphagia, post-stroke    AKI (acute kidney injury) ([place])    Hyperglycemia    Coronary artery disease involving native coronary artery of native heart without angina pectoris    Chronic obstructive pulmonary disease ([place])    Tremors of nervous system    S/P AVR    Thrombocytopenia (HCC)    Global aphasia    ICH (intracerebral hemorrhage) ([place]) 09/04/2019   Tremor 05/20/2019   Pulmonary valve vegetation 09/20/2018   Chest pain, atypical 09/20/2018   Onychomycosis 06/06/2018   Acute pain of left knee 08/18/2017   Wart 06/05/2017   Acute upper respiratory infection 05/12/2016   Skin irritation from shaving 12/02/2015   Benign paroxysmal positional vertigo 03/09/2015   Plantar fasciitis, bilateral 12/02/2014   Constipation 07/17/2014   Cough 04/09/2014   S/P AVR (aortic valve replacement) 11/04/2013   Elevated hemoglobin ([place]) 12/14/2012   Well adult exam 11/12/2012   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   Impaired glucose tolerance 02/14/2011   Edema 10/22/2010   Actinic keratoses 10/22/2010   THROMBOCYTOPENIA 01/22/2010   MELANOMA 03/05/2009   TOBACCO USE, QUIT 02/27/2009   Neoplasm of uncertain behavior of skin 05/08/2007   Dyslipidemia 02/08/2007   Essential hypertension 02/08/2007   Coronary atherosclerosis 02/08/2007   COPD (chronic obstructive pulmonary disease) ([place]) 02/08/2007   GERD 02/08/2007   Osteoarthritis 02/08/2007   LOW BACK PAIN 02/08/2007    Immunization History  Administered Date(s)  Administered   Fluad Quad(high Dose 65+) 01/29/2020   Influenza Whole 02/17/2011, 02/14/2012   Influenza-Unspecified 02/13/2001, 02/13/2002, 02/14/2003, 02/13/2013, 02/12/2014, 02/14/2015, 02/27/2018   PFIZER(Purple Top)SARS-COV-2 Vaccination 06/05/2019, 06/26/2019   Pneumococcal Conjugate-13 11/14/2013   Pneumococcal Polysaccharide-23 03/09/2012   Pneumococcal-Unspecified 08/15/1998   Td 05/04/2011    Conditions to be addressed/monitored:  Hypertension, Hyperlipidemia, GERD, and Anxiety  [name] Plan : CCM [name] Plan  Updates made by Tomasa Blase, RPH since 03/19/2021 12:00 AM     Problem: Hypertension, Hyperlipidemia, GERD, and Anxiety   Priority: High  Onset Date: 03/19/2021     Long-Range Goal: Disease Management   Start Date: 03/19/2021  Expected End Date: 03/19/2022  This Visit's Progress: On track  Priority: High  Note:   Current Barriers:  Unable to independently monitor therapeutic efficacy  Pharmacist Clinical Goal(s):  Patient will achieve improvement in LDL as evidenced by next lipid panel through collaboration with PharmD and provider.   Interventions: 1:1 collaboration with Plotnikov, [name], MD regarding development and update of comprehensive plan of [name] as evidenced by provider attestation and co-signature Inter-disciplinary [name] team collaboration (see longitudinal plan of [name]) Comprehensive medication review performed; medication list updated in electronic medical record  Hypertension (BP goal <140/90) -Controlled -Current treatment: Atenolol 77m - 1/2 tablet daily  -Medications previously tried: lisinopril, losartan, furosemide  -Current home readings: daughter reports that blood pressures have all been at goal when checked at therapy  BP Readings from Last 3 Encounters:  12/31/20 110/62  09/28/20 120/68  08/19/20 108/66  -Current dietary habits: follows sodium reduced diet  -Current exercise habits: PT once weekly - plans to increase to 2  times weekly  -Denies hypotensive/hypertensive symptoms -Educated on  BP goals and benefits of medications for prevention of heart attack, stroke and kidney damage; Daily salt intake goal < 2300 mg; Importance of home blood pressure monitoring; Proper BP monitoring technique; Symptoms of hypotension and importance of maintaining adequate hydration; -Counseled to monitor BP at home at least once weekly, document, and provide log at future appointments -Counseled on diet and exercise extensively Recommended to continue current medication  Hyperlipidemia: (LDL goal < 70) -Not ideally controlled Lab Results  Component Value Date   LDLCALC 129 (H) 12/31/2020  -Current treatment: Ezetimibe 35m - 1 tablet daily  ASA 3254m- 1 tablet daily  -Medications previously tried: atorvastatin, simvastsatin, colesevelam  -Current dietary patterns: daughter reports that she closely monitors diet, patient eating a low cholesterol diet -Current exercise habits: PT once weekly - plans to increase to 2 times weekly  -Educated on Cholesterol goals;  Importance of limiting foods high in cholesterol; -Recommended for patient to trial PSCK9 as he is statin intolerant and has a history of stroke - daughter agreeable if PCP is also agreeable  Situational Anxiety (Goal: prevention of anxiety attacks) -Controlled -Current treatment: Escitalopram 1014m 1 tablet daily  -Medications previously tried/failed: n/a -GAD7: unable to be assessed - reviewed medications with daughter  -Educated on Benefits of medication for symptom control Benefits of cognitive-behavioral therapy with or without medication -Recommended to continue current medication   GERD (Goal: Prevention of acid reflux) -Controlled -Current treatment  Pantoprazole 11m61m1 tablet daily  -Medications previously tried: n/a  -Counseled on diet and exercise extensively Recommended to continue current medication  Health Maintenance -Vaccine gaps:  shingles, COVID booster, and influenza vaccine -Current therapy:  Lactulose 10g/15mL85m0-60mL 17me daily as needed  Senna-docusate 8.6-50mg - 2 tablets daily  APAP 325mg -24m tablets every 4 hours as needed  Lotrisone cream - applied twice daily  Miralax - 17g daily Lion's Mane - 2 capsules daily  -Educated on Cost vs benefit of each product must be carefully weighed by individual consumer -Patient is satisfied with current therapy and denies issues -Recommended to continue current medication  Patient Goals/Self-[name] Activities Patient will:  - take medications as prescribed as evidenced by patient report and record review check blood pressure at least once weekly, document, and provide at future appointments  Follow Up Plan: Telephone follow up appointment with [name] management team member scheduled for: 3 months The patient has been provided with contact information for the [name] management team and has been advised to call with any health related questions or concerns.       Medication Assistance: None required.  Patient affirms current coverage meets needs.  [name] Gaps: microalbumin  [facility]  [facility] [address] [place] [id number] - [phone][phone] box? Yes Pt endorses 100% compliance  [name] Plan and Follow Up Patient Decision:  Patient agrees to [name] Plan and Follow-up.  Plan: Telephone follow up appointment with [name] management team member scheduled for:  3 months The patient has been provided with contact information for the [name] management  team and has been advised to call with any health related questions or concerns.  Tomasa Blase, PharmD Clinical Pharmacist, [place] screening examination/treatment/procedure(s) were performed by non-physician practitioner and as supervising physician I was immediately available for consultation/collaboration.  I agree with above. Lew Dawes, MD

## 2021-04-14 DIAGNOSIS — I2510 Atherosclerotic heart disease of native coronary artery without angina pectoris: Secondary | ICD-10-CM

## 2021-04-14 DIAGNOSIS — E785 Hyperlipidemia, unspecified: Secondary | ICD-10-CM

## 2021-04-14 DIAGNOSIS — I10 Essential (primary) hypertension: Secondary | ICD-10-CM

## 2021-05-05 ENCOUNTER — Ambulatory Visit (INDEPENDENT_AMBULATORY_CARE_PROVIDER_SITE_OTHER): Payer: Medicare Other | Admitting: Internal Medicine

## 2021-05-05 ENCOUNTER — Encounter: Payer: Self-pay | Admitting: Internal Medicine

## 2021-05-05 ENCOUNTER — Other Ambulatory Visit: Payer: Self-pay

## 2021-05-05 VITALS — BP 110/62 | HR 60 | Temp 98.1°F

## 2021-05-05 DIAGNOSIS — E785 Hyperlipidemia, unspecified: Secondary | ICD-10-CM

## 2021-05-05 DIAGNOSIS — R739 Hyperglycemia, unspecified: Secondary | ICD-10-CM

## 2021-05-05 DIAGNOSIS — I10 Essential (primary) hypertension: Secondary | ICD-10-CM | POA: Diagnosis not present

## 2021-05-05 DIAGNOSIS — Z23 Encounter for immunization: Secondary | ICD-10-CM | POA: Diagnosis not present

## 2021-05-05 DIAGNOSIS — G8111 Spastic hemiplegia affecting right dominant side: Secondary | ICD-10-CM | POA: Diagnosis not present

## 2021-05-05 DIAGNOSIS — I2510 Atherosclerotic heart disease of native coronary artery without angina pectoris: Secondary | ICD-10-CM

## 2021-05-05 DIAGNOSIS — M545 Low back pain, unspecified: Secondary | ICD-10-CM | POA: Diagnosis not present

## 2021-05-05 MED ORDER — ATENOLOL 25 MG PO TABS: 12.5000 mg | ORAL_TABLET | Freq: Every day | ORAL | 3 refills | Status: DC

## 2021-05-05 NOTE — Progress Notes (Signed)
Subjective:  Patient ID: David [name], male    DOB: 06-18-1923  Age: 87 y.o. MRN: [id number]  CC: Follow-up (4 month f/u- Flu shot)   HPI CARRY ORTEZ presents for post-CVA, depression C/o LBP. He is here w/dtr and caregiver [facility] Taking Doxy for 1 wk at the [place] - sacrum wound held He is able to walk w/a cane in the house   Outpatient Medications Prior to Visit  Medication Sig Dispense Refill   acetaminophen (TYLENOL) 325 MG tablet Take 1-2 tablets (325-650 mg total) by mouth every 4 (four) hours as needed for mild pain.     aspirin 325 MG tablet Take 325 mg by mouth daily.     clotrimazole-betamethasone (LOTRISONE) cream Apply 1 application topically 2 (two) times daily. 90 g 1   escitalopram (LEXAPRO) 10 MG tablet Take 1 tablet (10 mg total) by mouth daily. 90 tablet 3   ezetimibe (ZETIA) 10 MG tablet Take 1 tablet by mouth daily.     lactulose (CHRONULAC) 10 GM/15ML solution 30-60 ml bid po prn 1000 mL 5   OVER THE COUNTER MEDICATION 2 capsules daily. Lions mane capsules     pantoprazole (PROTONIX) 40 MG tablet TAKE 1 TABLET BY MOUTH EVERY DAY 90 tablet 3   polyethylene glycol (MIRALAX / GLYCOLAX) 17 g packet Take 17 g by mouth daily.     senna-docusate (SENOKOT-S) 8.6-50 MG tablet Take 2 tablets by mouth 2 (two) times daily. (Patient taking differently: Take 2 tablets by mouth daily.) 60 tablet 0   atenolol (TENORMIN) 25 MG tablet Take 1 tablet (25 mg total) by mouth daily. (Patient taking differently: Take 12.5 mg by mouth daily.) 30 tablet 0   No facility-administered medications prior to visit.    ROS: Review of Systems  Constitutional:  Negative for appetite change, fatigue and unexpected weight change.  HENT:  Negative for congestion, nosebleeds, sneezing, sore throat and trouble swallowing.   Eyes:  Negative for itching and visual disturbance.  Respiratory:  Negative for cough.   Cardiovascular:  Negative for chest pain, palpitations and leg swelling.   Gastrointestinal:  Negative for abdominal distention, blood in stool, diarrhea and nausea.  Genitourinary:  Negative for frequency and hematuria.  Musculoskeletal:  Positive for arthralgias, back pain and gait problem. Negative for joint swelling and neck pain.  Skin:  Negative for rash.  Neurological:  Positive for weakness. Negative for dizziness, tremors and speech difficulty.  Psychiatric/Behavioral:  Negative for agitation, dysphoric mood, self-injury, sleep disturbance and suicidal ideas. The patient is not nervous/anxious.    Objective:  BP 110/62 (BP Location: Left Arm)    Pulse 60    Temp 98.1 F (36.7 C) (Oral)    SpO2 97%   BP Readings from Last 3 Encounters:  05/05/21 110/62  12/31/20 110/62  09/28/20 120/68    Wt Readings from Last 3 Encounters:  12/31/20 183 lb (83 kg)  09/28/20 190 lb 6.4 oz (86.4 kg)  02/19/20 190 lb (86.2 kg)    Physical Exam Constitutional:      General: He is not in acute distress.    Appearance: Normal appearance. He is well-developed.     Comments: NAD  Eyes:     Conjunctiva/sclera: Conjunctivae normal.     Pupils: Pupils are equal, round, and reactive to light.  Neck:     Thyroid: No thyromegaly.     Vascular: No JVD.  Cardiovascular:     Rate and Rhythm: Normal rate and regular rhythm.  Heart sounds: Normal heart sounds. No murmur heard.   No friction rub. No gallop.  Pulmonary:     Effort: Pulmonary effort is normal. No respiratory distress.     Breath sounds: Normal breath sounds. No wheezing or rales.  Chest:     Chest wall: No tenderness.  Abdominal:     General: Bowel sounds are normal. There is no distension.     Palpations: Abdomen is soft. There is no mass.     Tenderness: There is no abdominal tenderness. There is no guarding or rebound.  Musculoskeletal:        General: No tenderness. Normal range of motion.     Cervical back: Normal range of motion.  Lymphadenopathy:     Cervical: No cervical adenopathy.   Skin:    General: Skin is warm and dry.     Findings: No rash.  Neurological:     Mental Status: He is alert and oriented to person, place, and time.     Cranial Nerves: No cranial nerve deficit.     Motor: No abnormal muscle tone.     Coordination: Coordination normal.     Gait: Gait normal.     Deep Tendon Reflexes: Reflexes are normal and symmetric.  Psychiatric:        Behavior: Behavior normal.        Thought Content: Thought content normal.        Judgment: Judgment normal.  R side is flassid In a w/c  Lab Results  Component Value Date   WBC 7.1 12/31/2020   HGB 14.9 12/31/2020   HCT 44.7 12/31/2020   PLT 133.0 (L) 12/31/2020   GLUCOSE 99 12/31/2020   CHOL 189 12/31/2020   TRIG 73.0 12/31/2020   HDL 45.60 12/31/2020   LDLDIRECT 136.2 08/01/2007   LDLCALC 129 (H) 12/31/2020   ALT 8 12/31/2020   AST 12 12/31/2020   NA 140 12/31/2020   K 4.0 12/31/2020   CL 103 12/31/2020   CREATININE 1.23 12/31/2020   BUN 18 12/31/2020   CO2 31 12/31/2020   TSH 2.35 12/31/2020   PSA 4.88 (H) 05/20/2019   INR 1.2 09/06/2019   HGBA1C 5.6 12/31/2020    DG Chest 2 View  Result Date: 09/10/2019 CLINICAL DATA:  Fever EXAM: CHEST - 2 VIEW COMPARISON:  09/04/2019 FINDINGS: Prior CABG and aortic valve replacement. The heart size and mediastinal contours are stable. Low lung volumes. Increased interstitial markings within the left lung base, slightly progressed from prior. No pneumothorax. IMPRESSION: Increased interstitial markings within the left lung base, slightly progressed from prior. Findings could represent atelectasis versus pneumonia. Electronically Signed   By: Davina Poke D.O.   On: 09/10/2019 16:33   DG Abd 1 View  Result Date: 09/10/2019 CLINICAL DATA:  Abdominal distension. EXAM: ABDOMEN - 1 VIEW COMPARISON:  None. FINDINGS: The bowel gas pattern is normal. Residual contrast is noted in the colon. No radio-opaque calculi or other significant radiographic abnormality  are seen. IMPRESSION: No evidence of bowel obstruction or ileus. Electronically Signed   By: Marijo Conception M.D.   On: 09/10/2019 16:32   CT HEAD WO CONTRAST  Result Date: 09/10/2019 CLINICAL DATA:  Follow-up left basal ganglia hemorrhage. Increased lethargy. EXAM: CT HEAD WITHOUT CONTRAST TECHNIQUE: Contiguous axial images were obtained from the base of the skull through the vertex without intravenous contrast. COMPARISON:  09/04/2019 FINDINGS: Brain: Slight enlargement of the left basal ganglia intraparenchymal hemorrhage, measuring 4.4 x 2.5 x 3.6 cm (volume =  21 cm^3) today compared with 16 cc volume 6 days ago. Additionally, there is intraventricular penetration, having breach the atrium of the left lateral ventricle, with a small amount of blood dependent in both occipital horns. Ventricular size remains stable however without hydrocephalus. Surrounding edema is slightly more pronounced. One could question if there has been extension of infarction more anteriorly within the basal ganglia and external capsule or if this relates to edema from the hemorrhage. Other small-vessel ischemic changes throughout brain appear stable. No new large vessel infarction. No extra-axial collection. Vascular: There is atherosclerotic calcification of the major vessels at the base of the brain. Skull: Negative Sinuses/Orbits: Clear/normal Other: None IMPRESSION: Slight enlargement of the left basal ganglia intraparenchymal hemorrhage when compared to the study of 6 days ago. Previous volume was calculated at 16 cc. Today calculated at 21 cc. There is also intraventricular penetration with a small amount of blood dependent in the occipital horns of both lateral ventricles but no hydrocephalus. Slightly more surrounding edema as expected. Some edema extends anterior into the basal ganglia and external capsule region, which could either be extension of edema or slight extension of the infarction itself. These results will be  called to the ordering clinician or representative by the Radiologist Assistant, and communication documented in the PACS or [employer]. Electronically Signed   By: Nelson Chimes M.D.   On: 09/10/2019 16:57    Assessment & Plan:   Problem List Items Addressed This Visit     Dyslipidemia    Cont on Zetia      Relevant Orders   Comprehensive metabolic panel   Essential hypertension - Primary   Relevant Medications   atenolol (TENORMIN) 25 MG tablet   Hyperglycemia   Relevant Orders   Comprehensive metabolic panel   Hemoglobin A1c   LOW BACK PAIN    Resolved  No pain now      Relevant Orders   Urinalysis   Spastic hemiparesis of right dominant side ([place])    He is able to walk w/a cane in the house w/one person help      Relevant Orders   Urinalysis   Other Visit Diagnoses     Needs flu shot       Relevant Orders   Flu Vaccine QUAD High Dose(Fluad) (Completed)         Meds ordered this encounter  Medications   atenolol (TENORMIN) 25 MG tablet    Sig: Take 0.5 tablets (12.5 mg total) by mouth daily.    Dispense:  45 tablet    Refill:  3      Follow-up: No follow-ups on file.  Walker Kehr, MD

## 2021-05-05 NOTE — Assessment & Plan Note (Signed)
Resolved  No pain now

## 2021-05-05 NOTE — Assessment & Plan Note (Signed)
Cont on Zetia

## 2021-05-05 NOTE — Assessment & Plan Note (Signed)
He is able to walk w/a cane in the house w/one person help

## 2021-06-01 ENCOUNTER — Other Ambulatory Visit: Payer: Self-pay | Admitting: Internal Medicine

## 2021-06-02 ENCOUNTER — Telehealth: Payer: Self-pay | Admitting: Internal Medicine

## 2021-06-02 NOTE — Telephone Encounter (Signed)
Spoke with daughter and she is going to arrange transportation and will call back to schedule Medicare Annual Wellness Visit   Last AWV  06/05/20  Please schedule at anytime with [facility] if patient calls the office back.    40 Minutes appointment   Any questions, please call me at [phone]

## 2021-06-25 ENCOUNTER — Telehealth: Payer: Medicare Other

## 2021-06-25 NOTE — Progress Notes (Deleted)
Chronic Care Management Pharmacy Note  06/25/2021 Name:  David [name] MRN:  [id number] DOB:  October 15, 1923  Summary: -Spoke with patient's daughter Narda Rutherford about patient's medications, she notes that since reducing atenolol to 12.31m daily BP has been much better controlled, no issues with hypotension -Patient still has cough on occasion, no issues with breathing, patient was not able to start albuterol tablets previously prescribed due to cost, interested in albuterol inhaler  -Daughter notes that escitalopram is effective at controlling patient's mental health, denies any issues or concerns at this time, aware she can reach out to office if this changes -LDL not ideally controlled, patient noted to be statin intolerant, eats a low cholesterol diet, noted that he is also going to physical therapy once weekly, planning to increase to twice daily in the near future  Recommendations/Changes made from today's visit: - Recommending for patient to start repatha 1461mevery 2 weeks to help bring LDL to goal / lower risk of heart attack or stroke  -Also recommending for patient to have albuterol 10872mact inhaler prescribed to be used as needed for cough / wheezing per daughter's request   Subjective: David [name] an [age]. year old male who is a primary patient of Plotnikov, AleEvie LacksD.  The CCM team was consulted for assistance with disease management and care coordination needs.    Engaged with patient by telephone for follow up visit in response to provider referral for pharmacy case management and/or care coordination services.   Consent to Services:  The patient was given the following information about Chronic Care Management services today, agreed to services, and gave verbal consent: 1. CCM service includes personalized support from designated clinical staff supervised by the primary care provider, including individualized plan of care and coordination with other care providers 2.  24/7 contact phone numbers for assistance for urgent and routine care needs. 3. Service will only be billed when office clinical staff spend 20 minutes or more in a month to coordinate care. 4. Only one practitioner may furnish and bill the service in a calendar month. 5.The patient may stop CCM services at any time (effective at the end of the month) by phone call to the office staff. 6. The patient will be responsible for cost sharing (co-pay) of up to 20% of the service fee (after annual deductible is met). Patient agreed to services and consent obtained.  Patient Care Team: Plotnikov, AleEvie LacksD as PCP - General (Internal Medicine) KelTroy SineD as PCP - Cardiology (Cardiology) SwaMeredith StaggersD as Consulting Physician (Physical Medicine and Rehabilitation) [facility] as Consulting Physician (Optometry) SzaDelice BisonanDarnelle Maff[facility]PHO[facility] Pharmacist (Pharmacist)  Recent office visits: 05/05/2021 - Dr. PloAlain Marionno changes to medications - f/u in 3 months   Recent consult visits: [facility] - no changes to medications   Hospital visits: None in previous 6 months  Objective:  Lab Results  Component Value Date   CREATININE 1.23 12/31/2020   BUN 18 12/31/2020   GFR 49.94 (L) 12/31/2020   GFRNONAA 57 (L) 01/29/2020   GFRAA 66 01/29/2020   NA 140 12/31/2020   K 4.0 12/31/2020   CALCIUM 9.4 12/31/2020   CO2 31 12/31/2020   GLUCOSE 99 12/31/2020    Lab Results  Component Value Date/Time   HGBA1C 5.6 12/31/2020 11:56 AM   HGBA1C 5.9 09/28/2020 11:29 AM   GFR 49.94 (L) 12/31/2020 11:56 AM  GFR 49.55 (L) 09/28/2020 11:29 AM    Last diabetic Eye exam:  No results found for: HMDIABEYEEXA  Last diabetic Foot exam:  No results found for: HMDIABFOOTEX   Lab Results  Component Value Date   CHOL 189 12/31/2020   HDL 45.60 12/31/2020   LDLCALC 129 (H) 12/31/2020   LDLDIRECT 136.2 08/01/2007   TRIG 73.0 12/31/2020   CHOLHDL 4  12/31/2020    Hepatic Function Latest Ref Rng & Units 12/31/2020 09/28/2020 03/30/2020  Total Protein 6.0 - 8.3 g/dL 7.0 7.1 6.8  Albumin 3.5 - 5.2 g/dL 3.7 4.0 3.7  AST 0 - 37 U/L _0 ALT 0 - 53 U/L _1 Alk Phosphatase 39 - 117 U/L 63 67 60  Total Bilirubin 0.2 - 1.2 mg/dL 0.5 0.5 0.7  Bilirubin, Direct 0.0 - 0.3 mg/dL - - -    Lab Results  Component Value Date/Time   TSH 2.35 12/31/2020 11:56 AM   TSH 2.32 09/28/2020 11:29 AM    CBC Latest Ref Rng & Units 12/31/2020 09/28/2020 03/30/2020  WBC 4.0 - 10.5 K/uL 7.1 7.6 8.3  Hemoglobin 13.0 - 17.0 g/dL 14.9 15.5 15.8  Hematocrit 39.0 - 52.0 % 44.7 45.6 47.0  Platelets 150.0 - 400.0 K/uL 133.0(L) 132.0(L) 122.0(L)    No results found for: VD25OH  Clinical ASCVD: Yes  The ASCVD Risk score (Arnett DK, et al., 2019) failed to calculate for the following reasons:   The 2019 ASCVD risk score is only valid for ages 94 to 56   The patient has a prior MI or stroke diagnosis    Depression screen [facility] 2/9 06/29/2020 06/05/2020 02/19/2020  Decreased Interest 0 0 0  Down, Depressed, Hopeless 0 0 0  PHQ - 2 Score 0 0 0  Altered sleeping 0 - -  Tired, decreased energy 0 - -  Change in appetite 0 - -  Feeling bad or failure about yourself  0 - -  Trouble concentrating 0 - -  Moving slowly or fidgety/restless 0 - -  Suicidal thoughts 0 - -  PHQ-9 Score 0 - -  Difficult doing work/chores - - -  Some recent data might be hidden    Social History   Tobacco Use  Smoking Status Former   Types: Pipe   Quit date: 05/16/1998   Years since quitting: 23.1  Smokeless Tobacco Former   BP Readings from Last 3 Encounters:  05/05/21 110/62  12/31/20 110/62  09/28/20 120/68   Pulse Readings from Last 3 Encounters:  05/05/21 60  12/31/20 63  09/28/20 (!) 59   Wt Readings from Last 3 Encounters:  12/31/20 183 lb (83 kg)  09/28/20 190 lb 6.4 oz (86.4 kg)  02/19/20 190 lb (86.2 kg)   BMI Readings from Last 3 Encounters:  12/31/20  25.52 kg/m  09/28/20 26.56 kg/m  04/22/20 26.50 kg/m    Assessment/Interventions: Review of patient past medical history, allergies, medications, health status, including review of consultants reports, laboratory and other test data, was performed as part of comprehensive evaluation and provision of chronic care management services.   SDOH:  (Social Determinants of Health) assessments and interventions performed: Yes  SDOH Screenings   Alcohol Screen: Not on file  Depression (PHQ2-9): Low Risk    PHQ-2 Score: 0  Financial Resource Strain: Not on file  Food Insecurity: Not on file  Housing: Not on file  Physical Activity: Not on file  Social Connections: Not on file  Stress: Not on  file  Tobacco Use: Medium Risk   Smoking Tobacco Use: Former   Smokeless Tobacco Use: Former   Passive Exposure: Not on [employer] consultant Needs: Not on file    CCM Care Plan  Allergies  Allergen Reactions   Atorvastatin Other (See Comments)    Unknown reaction   Colesevelam Other (See Comments)    Unknown reaction   Simvastatin Other (See Comments)    Unknown reaction   Sulfa Antibiotics Other (See Comments)    Unknown reaction    Medications Reviewed Today     Reviewed by Cassandria Anger, MD (Physician) on 05/05/21 at 1151  Med List Status: <None>   Medication Order Taking? Sig Documenting Provider Last Dose Status Informant  acetaminophen (TYLENOL) 325 MG tablet [id number] Yes Take 1-2 tablets (325-650 mg total) by mouth every 4 (four) hours as needed for mild pain. Flora Lipps Taking Active   aspirin 325 MG tablet [id number] Yes Take 325 mg by mouth daily. [provider] Taking Active   atenolol (TENORMIN) 25 MG tablet [id number]  Take 0.5 tablets (12.5 mg total) by mouth daily. Plotnikov, [name], MD  Active   clotrimazole-betamethasone (LOTRISONE) cream [id number] Yes Apply 1 application topically 2 (two) times daily. Plotnikov, [name], MD Taking Active    escitalopram (LEXAPRO) 10 MG tablet [id number] Yes Take 1 tablet (10 mg total) by mouth daily. Plotnikov, [name], MD Taking Active   ezetimibe (ZETIA) 10 MG tablet [id number] Yes Take 1 tablet by mouth daily. [provider] Taking Active   lactulose (CHRONULAC) 10 GM/15ML solution [id number] Yes 30-60 ml bid po prn Plotnikov, [name], MD Taking Active   OVER THE COUNTER MEDICATION [id number] Yes 2 capsules daily. Lions mane capsules [provider] Taking Active   pantoprazole (PROTONIX) 40 MG tablet [id number] Yes TAKE 1 TABLET BY MOUTH EVERY DAY Plotnikov, [name], MD Taking Active   polyethylene glycol (MIRALAX / GLYCOLAX) 17 g packet [id number] Yes Take 17 g by mouth daily. [provider] Taking Active   senna-docusate (SENOKOT-S) 8.6-50 MG tablet [id number] Yes Take 2 tablets by mouth 2 (two) times daily.  Patient taking differently: Take 2 tablets by mouth daily.   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 10/17/2019   Depression, major, single episode, moderate (HCC) 10/17/2019   Cognitive and neurobehavioral dysfunction    Labile blood pressure    Dysphagia, post-stroke    AKI (acute kidney injury) ([place])    Hyperglycemia    Coronary artery disease involving native coronary artery of native heart without angina pectoris    Chronic obstructive pulmonary disease (HCC)    Tremors of nervous system    S/P AVR    Thrombocytopenia ([place])    Global aphasia     ICH (intracerebral hemorrhage) ([place]) 09/04/2019   Tremor 05/20/2019   Pulmonary valve vegetation 09/20/2018   Chest pain, atypical 09/20/2018   Onychomycosis 06/06/2018   Acute pain of left knee 08/18/2017  Wart 06/05/2017   Acute upper respiratory infection 05/12/2016   Skin irritation from shaving 12/02/2015   Benign paroxysmal positional vertigo 03/09/2015   Plantar fasciitis, bilateral 12/02/2014   Constipation 07/17/2014   Cough 04/09/2014   S/P AVR (aortic valve replacement) 11/04/2013   Elevated hemoglobin ([place]) 12/14/2012   Well adult exam 11/12/2012   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   Impaired glucose tolerance 02/14/2011   Edema 10/22/2010   Actinic keratoses 10/22/2010   THROMBOCYTOPENIA 01/22/2010   MELANOMA 03/05/2009   TOBACCO USE, QUIT 02/27/2009   Neoplasm of uncertain behavior of skin 05/08/2007   Dyslipidemia 02/08/2007   Essential hypertension 02/08/2007   Coronary atherosclerosis 02/08/2007   COPD (chronic obstructive pulmonary disease) ([place]) 02/08/2007   GERD 02/08/2007   Osteoarthritis 02/08/2007   LOW BACK PAIN 02/08/2007    Immunization History  Administered Date(s) Administered   Fluad Quad(high Dose 65+) 01/29/2020, 05/05/2021   Influenza Whole 02/17/2011, 02/14/2012   Influenza-Unspecified 02/13/2001, 02/13/2002, 02/14/2003, 02/13/2013, 02/12/2014, 02/14/2015, 02/27/2018   PFIZER(Purple Top)SARS-COV-2 Vaccination 06/05/2019, 06/26/2019   Pneumococcal Conjugate-13 11/14/2013   Pneumococcal Polysaccharide-23 03/09/2012   Pneumococcal-Unspecified 08/15/1998   Td 05/04/2011    Conditions to be addressed/monitored:  Hypertension, Hyperlipidemia, GERD, and Anxiety  There are no care plans that you recently modified to display for this patient.      Medication Assistance: None required.  Patient affirms current coverage meets needs.  Care Gaps: microalbumin  Patient's preferred pharmacy is:  [facility] [place] [place] [address] [place] [address] Phone: [phone] Fax: [phone]  [id number] [place] [address] [place] [place]: [phone]: [phone] [place] [address]: [phone]: [phone]   Uses pill box? Yes Pt endorses 100% compliance  Care Plan and Follow Up Patient Decision:  Patient agrees to Care Plan and Follow-up.  Plan: Telephone follow up appointment with care management team member scheduled for:  3 months The patient has been provided with contact information for the care management team and has been advised to call with any health related questions or concerns.   DTomasa Blase PharmD Clinical Pharmacist, [place]

## 2021-07-23 ENCOUNTER — Other Ambulatory Visit: Payer: Self-pay | Admitting: Internal Medicine

## 2021-08-03 ENCOUNTER — Ambulatory Visit: Payer: Medicare Other | Admitting: Internal Medicine

## 2021-08-17 ENCOUNTER — Encounter: Payer: Self-pay | Admitting: Internal Medicine

## 2021-08-17 ENCOUNTER — Ambulatory Visit (INDEPENDENT_AMBULATORY_CARE_PROVIDER_SITE_OTHER): Payer: Medicare Other | Admitting: Internal Medicine

## 2021-08-17 DIAGNOSIS — M545 Low back pain, unspecified: Secondary | ICD-10-CM

## 2021-08-17 DIAGNOSIS — N320 Bladder-neck obstruction: Secondary | ICD-10-CM | POA: Diagnosis not present

## 2021-08-17 DIAGNOSIS — G8111 Spastic hemiplegia affecting right dominant side: Secondary | ICD-10-CM

## 2021-08-17 DIAGNOSIS — B079 Viral wart, unspecified: Secondary | ICD-10-CM

## 2021-08-17 DIAGNOSIS — R4701 Aphasia: Secondary | ICD-10-CM | POA: Diagnosis not present

## 2021-08-17 DIAGNOSIS — E785 Hyperlipidemia, unspecified: Secondary | ICD-10-CM | POA: Diagnosis not present

## 2021-08-17 DIAGNOSIS — I259 Chronic ischemic heart disease, unspecified: Secondary | ICD-10-CM | POA: Insufficient documentation

## 2021-08-17 DIAGNOSIS — R739 Hyperglycemia, unspecified: Secondary | ICD-10-CM

## 2021-08-17 DIAGNOSIS — L89309 Pressure ulcer of unspecified buttock, unspecified stage: Secondary | ICD-10-CM | POA: Insufficient documentation

## 2021-08-17 DIAGNOSIS — Z658 Other specified problems related to psychosocial circumstances: Secondary | ICD-10-CM | POA: Insufficient documentation

## 2021-08-17 DIAGNOSIS — Z741 Need for assistance with personal care: Secondary | ICD-10-CM | POA: Insufficient documentation

## 2021-08-17 DIAGNOSIS — I69951 Hemiplegia and hemiparesis following unspecified cerebrovascular disease affecting right dominant side: Secondary | ICD-10-CM | POA: Diagnosis not present

## 2021-08-17 DIAGNOSIS — H6121 Impacted cerumen, right ear: Secondary | ICD-10-CM | POA: Insufficient documentation

## 2021-08-17 DIAGNOSIS — L603 Nail dystrophy: Secondary | ICD-10-CM | POA: Insufficient documentation

## 2021-08-17 DIAGNOSIS — I359 Nonrheumatic aortic valve disorder, unspecified: Secondary | ICD-10-CM | POA: Insufficient documentation

## 2021-08-17 DIAGNOSIS — I611 Nontraumatic intracerebral hemorrhage in hemisphere, cortical: Secondary | ICD-10-CM

## 2021-08-17 DIAGNOSIS — M21371 Foot drop, right foot: Secondary | ICD-10-CM | POA: Insufficient documentation

## 2021-08-17 DIAGNOSIS — I69351 Hemiplegia and hemiparesis following cerebral infarction affecting right dominant side: Secondary | ICD-10-CM | POA: Insufficient documentation

## 2021-08-17 LAB — COMPREHENSIVE METABOLIC PANEL
ALT: 7 U/L (ref 0–53)
AST: 15 U/L (ref 0–37)
Albumin: 3.9 g/dL (ref 3.5–5.2)
Alkaline Phosphatase: 60 U/L (ref 39–117)
BUN: 19 mg/dL (ref 6–23)
CO2: 31 mEq/L (ref 19–32)
Calcium: 9.7 mg/dL (ref 8.4–10.5)
Chloride: 102 mEq/L (ref 96–112)
Creatinine, Ser: 1.3 mg/dL (ref 0.40–1.50)
GFR: 46.53 mL/min — ABNORMAL LOW (ref >60.00)
Glucose, Bld: 108 mg/dL — ABNORMAL HIGH (ref 70–99)
Potassium: 3.9 mEq/L (ref 3.5–5.1)
Sodium: 138 mEq/L (ref 135–145)
Total Bilirubin: 0.6 mg/dL (ref 0.2–1.2)
Total Protein: 7.1 g/dL (ref 6.0–8.3)

## 2021-08-17 LAB — HEMOGLOBIN A1C: Hgb A1c MFr Bld: 5.6 % (ref 4.6–6.5)

## 2021-08-17 NOTE — Patient Instructions (Addendum)
Sign up for [employer] ( via [employer] on your phone or your ipad). If you don't have a Art therapist card  - go to [employer] branch. They will set you up in 15 minutes. It is free. You can check out books to read and to listen, check out magazines and newspapers, movies etc. ? ?UGG boots ? ? Postprocedure instructions :  ? ?  Keep the wounds clean. You can wash them with liquid soap and water. Pat dry with gauze or a Kleenex tissue  Before applying antibiotic ointment and a Band-Aid. ? ? You need to report immediately  if  any signs of infection develop.  ?

## 2021-08-17 NOTE — Assessment & Plan Note (Signed)
Remote ?Cont w/BP control ?

## 2021-08-17 NOTE — Assessment & Plan Note (Signed)
Better w/ST ?

## 2021-08-17 NOTE — Assessment & Plan Note (Signed)
Controlled.  

## 2021-08-17 NOTE — Progress Notes (Signed)
? ?Subjective:  ?Patient ID: David [name], male    DOB: 05/11/1924  Age: 88 y.o. MRN: [id number] ? ?CC: No chief complaint on file. ? ? ?HPI ?Stefano Gaul presents for CVA/R hemiparesis, HTN and aphasia f/u ?C/o a wart ? ?Outpatient Medications Prior to Visit  ?Medication Sig Dispense Refill  ? acetaminophen (TYLENOL) 325 MG tablet Take 1-2 tablets (325-650 mg total) by mouth every 4 (four) hours as needed for mild pain.    ? aspirin 325 MG tablet Take 325 mg by mouth daily.    ? atenolol (TENORMIN) 25 MG tablet Take 0.5 tablets (12.5 mg total) by mouth daily. 45 tablet 3  ? clotrimazole-betamethasone (LOTRISONE) cream APPLY TO AFFECTED AREA TWICE A DAY 90 g 1  ? escitalopram (LEXAPRO) 10 MG tablet TAKE 1 TABLET BY MOUTH EVERY DAY 90 tablet 3  ? ezetimibe (ZETIA) 10 MG tablet Take 1 tablet by mouth daily.    ? lactulose (CHRONULAC) 10 GM/15ML solution 30-60 ml bid po prn 1000 mL 5  ? OVER THE COUNTER MEDICATION 2 capsules daily. Lions mane capsules    ? pantoprazole (PROTONIX) 40 MG tablet TAKE 1 TABLET BY MOUTH EVERY DAY 90 tablet 3  ? polyethylene glycol (MIRALAX / GLYCOLAX) 17 g packet Take 17 g by mouth daily.    ? senna-docusate (SENOKOT-S) 8.6-50 MG tablet Take 2 tablets by mouth 2 (two) times daily. (Patient taking differently: Take 2 tablets by mouth daily.) 60 tablet 0  ? ?No facility-administered medications prior to visit.  ? ? ?ROS: ?Review of Systems  ?Constitutional:  Negative for appetite change, fatigue and unexpected weight change.  ?HENT:  Negative for congestion, nosebleeds, sneezing, sore throat and trouble swallowing.   ?Eyes:  Negative for itching and visual disturbance.  ?Respiratory:  Negative for cough.   ?Cardiovascular:  Negative for chest pain, palpitations and leg swelling.  ?Gastrointestinal:  Negative for abdominal distention, blood in stool, diarrhea and nausea.  ?Genitourinary:  Negative for frequency and hematuria.  ?Musculoskeletal:  Negative for back pain, gait problem, joint  swelling and neck pain.  ?Skin:  Negative for rash.  ?Neurological:  Negative for dizziness, tremors, speech difficulty and weakness.  ?Psychiatric/Behavioral:  Negative for agitation, dysphoric mood, sleep disturbance and suicidal ideas. The patient is not nervous/anxious.   ? ?Objective:  ?BP 114/62 (BP Location: Left Arm, Patient Position: Sitting, Cuff Size: Large)   Pulse (!) 57   Temp 97.9 ?F (36.6 ?C) (Oral)   Ht '5\' 11"'$  (1.803 m)   SpO2 98%   BMI 25.52 kg/m?  ? ?BP Readings from Last 3 Encounters:  ?08/17/21 114/62  ?05/05/21 110/62  ?12/31/20 110/62  ? ? ?Wt Readings from Last 3 Encounters:  ?12/31/20 183 lb (83 kg)  ?09/28/20 190 lb 6.4 oz (86.4 kg)  ?02/19/20 190 lb (86.2 kg)  ? ? ?Physical Exam ?Constitutional:   ?   General: He is not in acute distress. ?   Appearance: He is well-developed.  ?   Comments: NAD  ?Eyes:  ?   Conjunctiva/sclera: Conjunctivae normal.  ?   Pupils: Pupils are equal, round, and reactive to light.  ?Neck:  ?   Thyroid: No thyromegaly.  ?   Vascular: No JVD.  ?Cardiovascular:  ?   Rate and Rhythm: Normal rate and regular rhythm.  ?   Heart sounds: Normal heart sounds. No murmur heard. ?  No friction rub. No gallop.  ?Pulmonary:  ?   Effort: Pulmonary effort is normal. No respiratory distress.  ?  Breath sounds: Normal breath sounds. No wheezing or rales.  ?Chest:  ?   Chest wall: No tenderness.  ?Abdominal:  ?   General: Bowel sounds are normal. There is no distension.  ?   Palpations: Abdomen is soft. There is no mass.  ?   Tenderness: There is no abdominal tenderness. There is no guarding or rebound.  ?Musculoskeletal:     ?   General: No tenderness. Normal range of motion.  ?   Cervical back: Normal range of motion.  ?Lymphadenopathy:  ?   Cervical: No cervical adenopathy.  ?Skin: ?   General: Skin is warm and dry.  ?   Findings: No rash.  ?Neurological:  ?   Mental Status: He is alert and oriented to person, place, and time.  ?   Cranial Nerves: No cranial nerve deficit.   ?   Motor: Weakness present. No abnormal muscle tone.  ?   Coordination: Coordination normal.  ?   Gait: Gait abnormal.  ?   Deep Tendon Reflexes: Reflexes are normal and symmetric.  ?Psychiatric:     ?   Behavior: Behavior normal.     ?   Thought Content: Thought content normal.     ?   Judgment: Judgment normal.  ?Purplish feet ?Ahasia - better ? ?In a w/c ? ?L hand wart 6 mm ? ? ? Procedure Note :  ? ?  Procedure : Cryosurgery ? ? Indication:  Wart(s)  L hand ? ? Risks including unsuccessful procedure , bleeding, infection, bruising, scar, a need for a repeat  procedure and others were explained to the patient in detail as well as the benefits. Informed consent was obtained verbally.  ? ? 1 lesion(s)  on  L hand  was/were treated with liquid nitrogen on a Q-tip in a usual fasion . Band-Aid was applied and antibiotic ointment was given for a later use. ? ? Tolerated well. Complications none. ? ? Postprocedure instructions :  ? ?  Keep the wounds clean. You can wash them with liquid soap and water. Pat dry with gauze or a Kleenex tissue  Before applying antibiotic ointment and a Band-Aid. ? ? You need to report immediately  if  any signs of infection develop.  ? ? ?Lab Results  ?Component Value Date  ? WBC 7.1 12/31/2020  ? HGB 14.9 12/31/2020  ? HCT 44.7 12/31/2020  ? PLT 133.0 (L) 12/31/2020  ? GLUCOSE 99 12/31/2020  ? CHOL 189 12/31/2020  ? TRIG 73.0 12/31/2020  ? HDL 45.60 12/31/2020  ? LDLDIRECT 136.2 08/01/2007  ? LDLCALC 129 (H) 12/31/2020  ? ALT 8 12/31/2020  ? AST 12 12/31/2020  ? NA 140 12/31/2020  ? K 4.0 12/31/2020  ? CL 103 12/31/2020  ? CREATININE 1.23 12/31/2020  ? BUN 18 12/31/2020  ? CO2 31 12/31/2020  ? TSH 2.35 12/31/2020  ? PSA 4.88 (H) 05/20/2019  ? INR 1.2 09/06/2019  ? HGBA1C 5.6 12/31/2020  ? ? ?DG Chest 2 View ? ?Result Date: 09/10/2019 ?CLINICAL DATA:  Fever EXAM: CHEST - 2 VIEW COMPARISON:  09/04/2019 FINDINGS: Prior CABG and aortic valve replacement. The heart size and mediastinal  contours are stable. Low lung volumes. Increased interstitial markings within the left lung base, slightly progressed from prior. No pneumothorax. IMPRESSION: Increased interstitial markings within the left lung base, slightly progressed from prior. Findings could represent atelectasis versus pneumonia. Electronically Signed   By: Davina Poke D.O.   On: 09/10/2019 16:33  ? ?DG Abd  1 View ? ?Result Date: 09/10/2019 ?CLINICAL DATA:  Abdominal distension. EXAM: ABDOMEN - 1 VIEW COMPARISON:  None. FINDINGS: The bowel gas pattern is normal. Residual contrast is noted in the colon. No radio-opaque calculi or other significant radiographic abnormality are seen. IMPRESSION: No evidence of bowel obstruction or ileus. Electronically Signed   By: Marijo Conception M.D.   On: 09/10/2019 16:32  ? ?CT HEAD WO CONTRAST ? ?Result Date: 09/10/2019 ?CLINICAL DATA:  Follow-up left basal ganglia hemorrhage. Increased lethargy. EXAM: CT HEAD WITHOUT CONTRAST TECHNIQUE: Contiguous axial images were obtained from the base of the skull through the vertex without intravenous contrast. COMPARISON:  09/04/2019 FINDINGS: Brain: Slight enlargement of the left basal ganglia intraparenchymal hemorrhage, measuring 4.4 x 2.5 x 3.6 cm (volume = 21 cm^3) today compared with 16 cc volume 6 days ago. Additionally, there is intraventricular penetration, having breach the atrium of the left lateral ventricle, with a small amount of blood dependent in both occipital horns. Ventricular size remains stable however without hydrocephalus. Surrounding edema is slightly more pronounced. One could question if there has been extension of infarction more anteriorly within the basal ganglia and external capsule or if this relates to edema from the hemorrhage. Other small-vessel ischemic changes throughout brain appear stable. No new large vessel infarction. No extra-axial collection. Vascular: There is atherosclerotic calcification of the major vessels at the base  of the brain. Skull: Negative Sinuses/Orbits: Clear/normal Other: None IMPRESSION: Slight enlargement of the left basal ganglia intraparenchymal hemorrhage when compared to the study of 6 days ago. Previous vol

## 2021-08-17 NOTE — Assessment & Plan Note (Signed)
Cont w/ home PT  

## 2021-08-17 NOTE — Assessment & Plan Note (Signed)
L hand ?See cryo ?

## 2021-08-18 LAB — URINALYSIS
Bilirubin Urine: NEGATIVE
Hgb urine dipstick: NEGATIVE
Ketones, ur: NEGATIVE
Leukocytes,Ua: NEGATIVE
Nitrite: NEGATIVE
Specific Gravity, Urine: 1.02 (ref 1.000–1.030)
Total Protein, Urine: NEGATIVE
Urine Glucose: NEGATIVE
Urobilinogen, UA: 0.2 (ref 0.0–1.0)
pH: 6 (ref 5.0–8.0)

## 2021-11-05 IMAGING — DX DG CHEST 2V
2 series · 2 of 2 positions shown · non-contrast
Comparison: 10/05/2019

CLINICAL DATA: [AGE] male with chronic cough

EXAM:
CHEST - 2 VIEW

[chest pa]
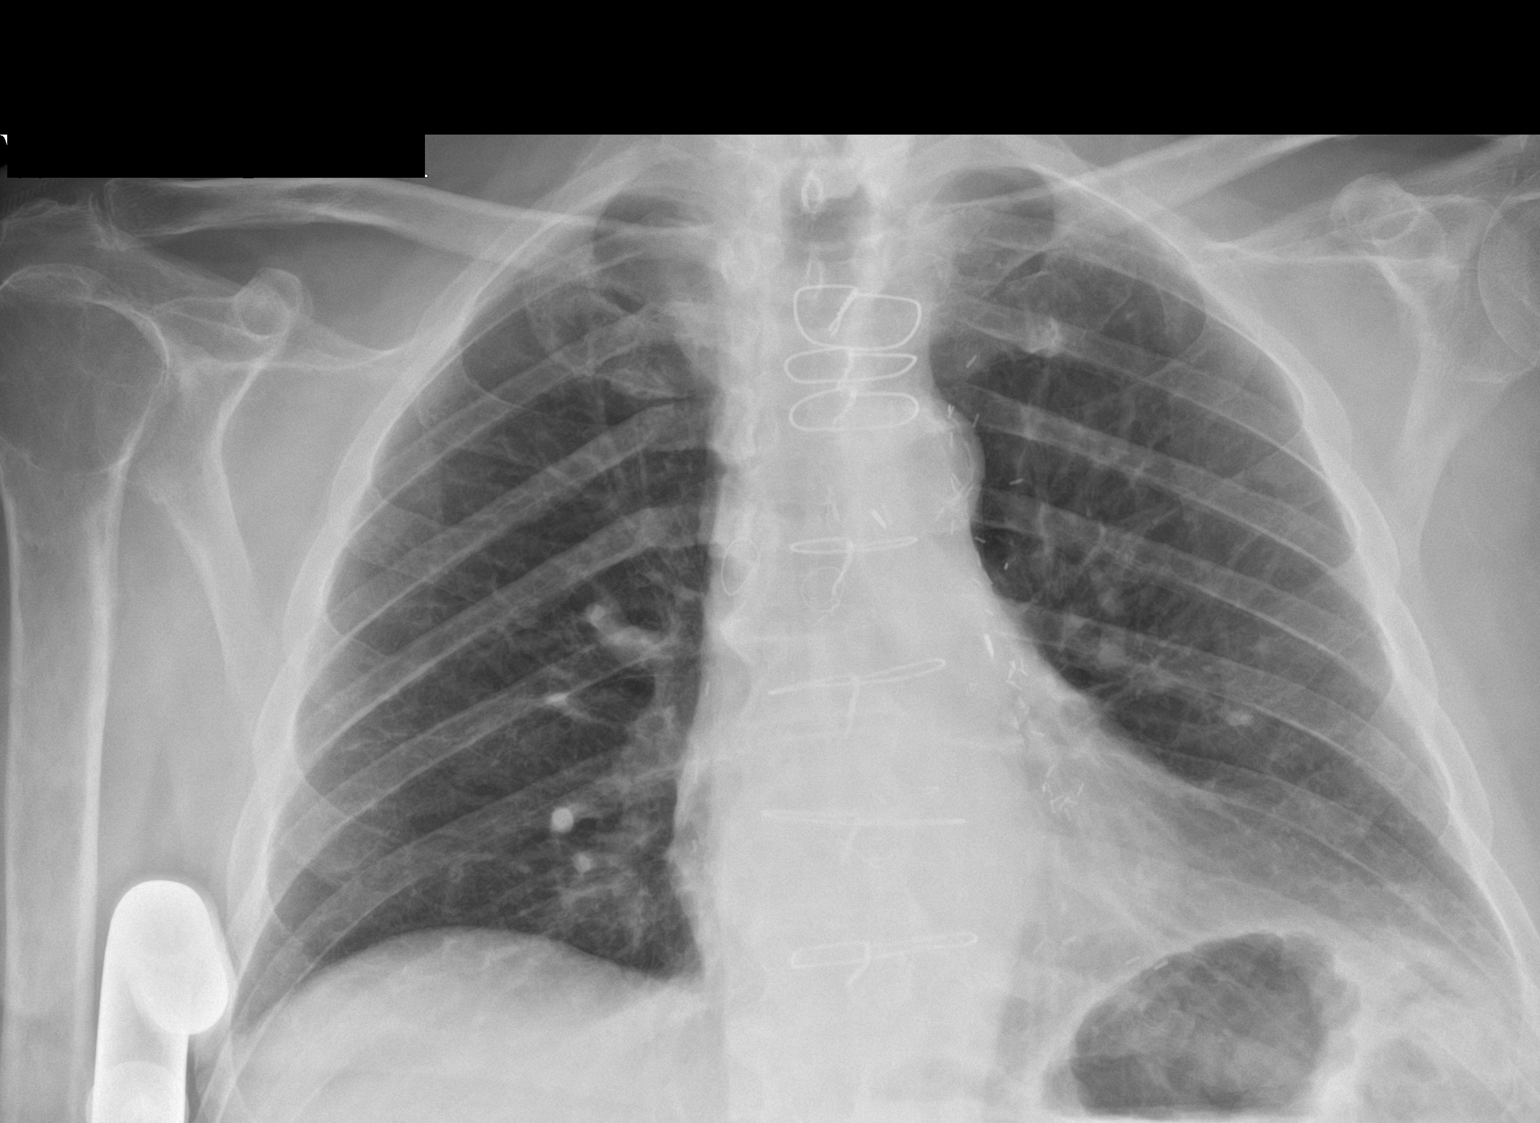

[chest lat]
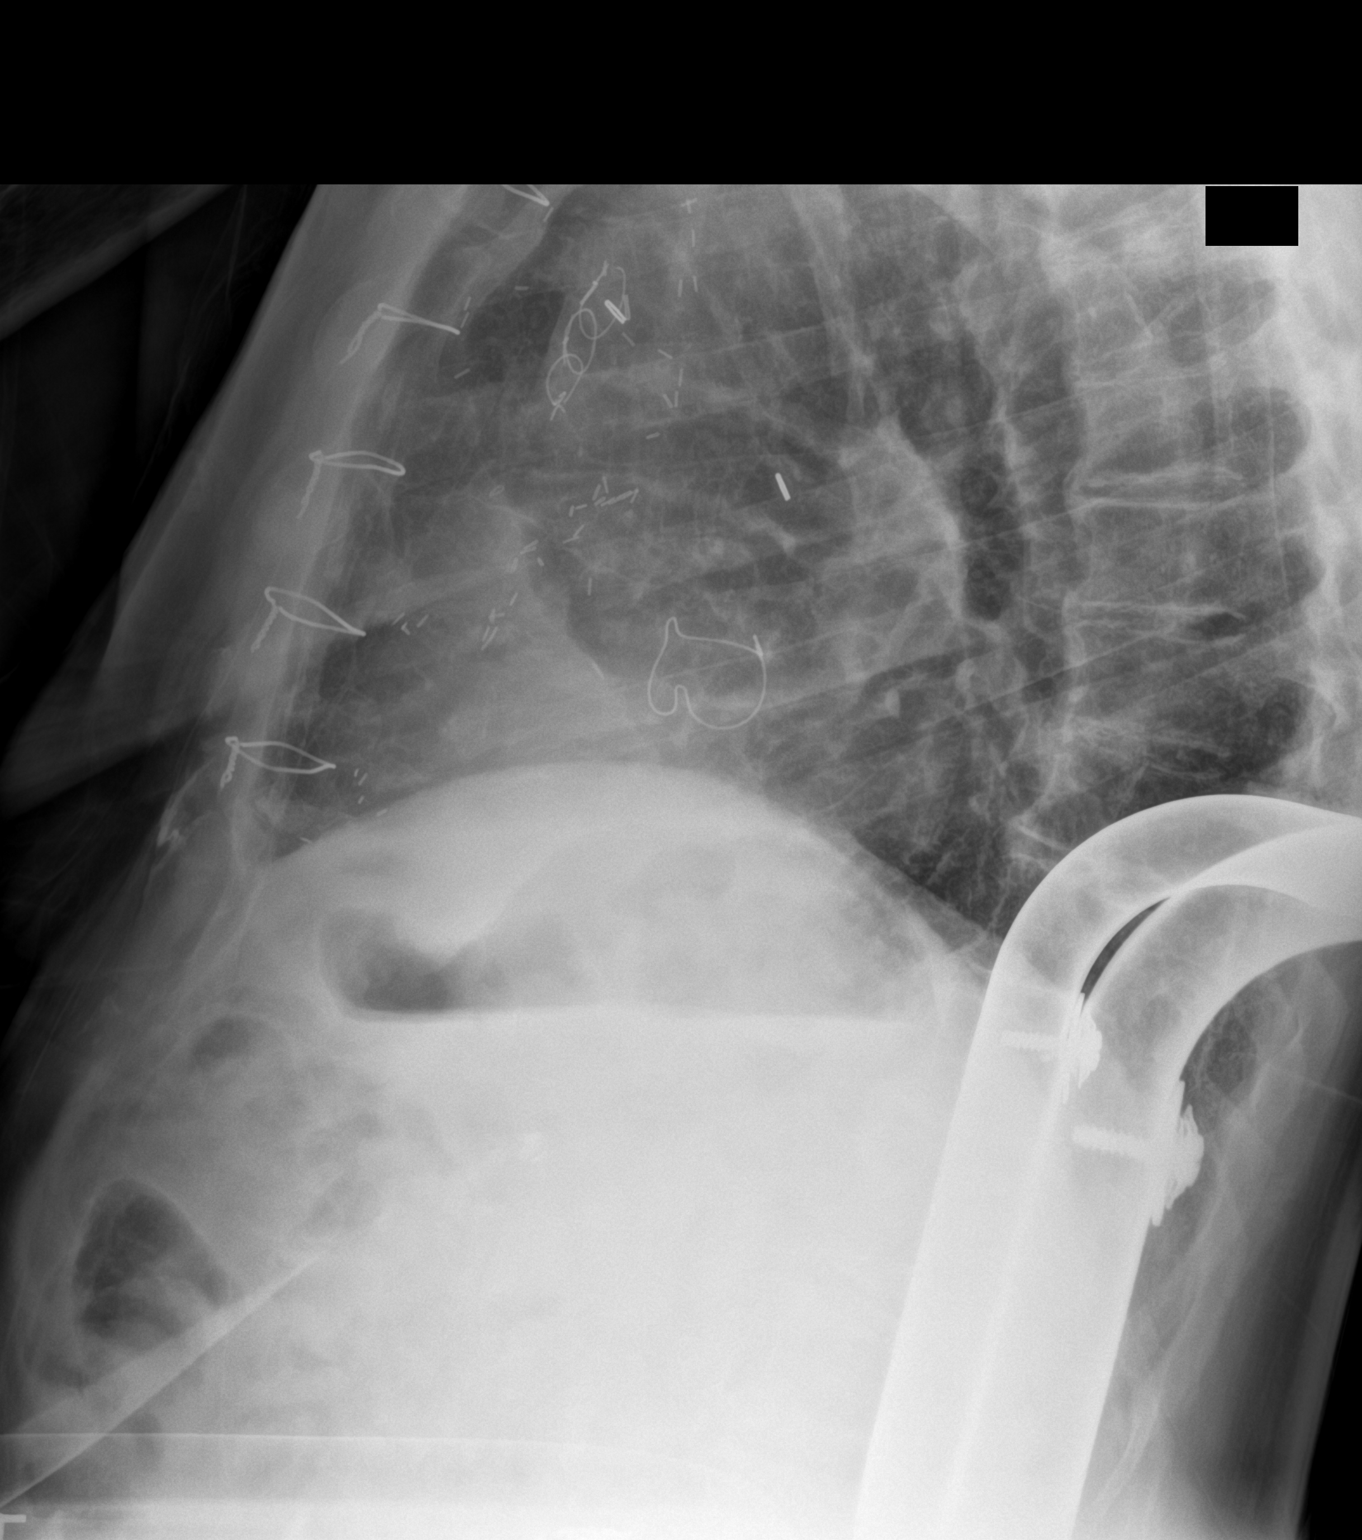

[2 of 2 positions shown; findings below may reference images not displayed]

FINDINGS: Cardiomediastinal silhouette unchanged in size and contour. Surgical
changes of median sternotomy and CABG. Surgical changes of aortic
valve repair.

Low lung volumes. No evidence of interlobular septal thickening. No
pneumothorax or pleural effusion. No confluent airspace disease.
Note that the lateral view is limited by exclusion of superior chest
and the posterior lung base.

No evidence of displaced fracture
IMPRESSION: Low lung volumes with chronic changes and no definite evidence of
acute cardiopulmonary disease.

Surgical changes of median sternotomy CABG and aortic valve repair

## 2021-12-06 ENCOUNTER — Other Ambulatory Visit: Payer: Self-pay | Admitting: Internal Medicine

## 2022-02-10 ENCOUNTER — Encounter: Payer: Self-pay | Admitting: Internal Medicine

## 2022-02-10 ENCOUNTER — Ambulatory Visit (INDEPENDENT_AMBULATORY_CARE_PROVIDER_SITE_OTHER): Payer: Medicare Other | Admitting: Internal Medicine

## 2022-02-10 VITALS — BP 122/58 | HR 60 | Temp 98.5°F | Ht 71.0 in

## 2022-02-10 DIAGNOSIS — L304 Erythema intertrigo: Secondary | ICD-10-CM | POA: Diagnosis not present

## 2022-02-10 DIAGNOSIS — Z23 Encounter for immunization: Secondary | ICD-10-CM | POA: Diagnosis not present

## 2022-02-10 DIAGNOSIS — R21 Rash and other nonspecific skin eruption: Secondary | ICD-10-CM | POA: Diagnosis not present

## 2022-02-10 DIAGNOSIS — I10 Essential (primary) hypertension: Secondary | ICD-10-CM

## 2022-02-10 DIAGNOSIS — R739 Hyperglycemia, unspecified: Secondary | ICD-10-CM

## 2022-02-10 DIAGNOSIS — E785 Hyperlipidemia, unspecified: Secondary | ICD-10-CM | POA: Diagnosis not present

## 2022-02-10 DIAGNOSIS — R7989 Other specified abnormal findings of blood chemistry: Secondary | ICD-10-CM | POA: Diagnosis not present

## 2022-02-10 DIAGNOSIS — I69351 Hemiplegia and hemiparesis following cerebral infarction affecting right dominant side: Secondary | ICD-10-CM

## 2022-02-10 DIAGNOSIS — I259 Chronic ischemic heart disease, unspecified: Secondary | ICD-10-CM | POA: Diagnosis not present

## 2022-02-10 DIAGNOSIS — D751 Secondary polycythemia: Secondary | ICD-10-CM

## 2022-02-10 DIAGNOSIS — I69922 Dysarthria following unspecified cerebrovascular disease: Secondary | ICD-10-CM

## 2022-02-10 MED ORDER — CLINDAMYCIN PHOSPHATE 1 % EX GEL: Freq: Two times a day (BID) | CUTANEOUS | 1 refills | Status: DC

## 2022-02-10 MED ORDER — FLUCONAZOLE 100 MG PO TABS: ORAL_TABLET | ORAL | 1 refills | Status: DC

## 2022-02-10 NOTE — Assessment & Plan Note (Signed)
Rash on post neck Cleocin gel Rx

## 2022-02-10 NOTE — Assessment & Plan Note (Signed)
No change 

## 2022-02-10 NOTE — Assessment & Plan Note (Signed)
Statin intolerant - all Cont on Zetia

## 2022-02-10 NOTE — Progress Notes (Signed)
Subjective:  Patient ID: David [name], male    DOB: Dec 19, 1923  Age: 88 y.o. MRN: [id number]  CC: Follow-up (4 months f/u- Flu shot)   HPI David [name] presents for post-CVA hemiparesis, depression, HTN  Here w/dtr and son  Outpatient Medications Prior to Visit  Medication Sig Dispense Refill   acetaminophen (TYLENOL) 325 MG tablet Take 1-2 tablets (325-650 mg total) by mouth every 4 (four) hours as needed for mild pain.     aspirin EC (ASPIRIN 81) 81 MG tablet Take 81 mg by mouth daily. Swallow whole.     atenolol (TENORMIN) 25 MG tablet Take 0.5 tablets (12.5 mg total) by mouth daily. 45 tablet 3   clotrimazole-betamethasone (LOTRISONE) cream APPLY TO AFFECTED AREA TWICE A DAY 90 g 1   escitalopram (LEXAPRO) 10 MG tablet TAKE 1 TABLET BY MOUTH EVERY DAY 90 tablet 3   ezetimibe (ZETIA) 10 MG tablet Take 1 tablet by mouth daily.     lactulose (CHRONULAC) 10 GM/15ML solution 30-60 ml bid po prn 1000 mL 5   OVER THE COUNTER MEDICATION 2 capsules daily. Lions mane capsules     pantoprazole (PROTONIX) 40 MG tablet Take 1 tablet (40 mg total) by mouth daily. Annual appt w/labs are due Aug must see provider for future refills 90 tablet 0   polyethylene glycol (MIRALAX / GLYCOLAX) 17 g packet Take 17 g by mouth daily.     senna-docusate (SENOKOT-S) 8.6-50 MG tablet Take 2 tablets by mouth 2 (two) times daily. (Patient taking differently: Take 2 tablets by mouth daily.) 60 tablet 0   aspirin 325 MG tablet Take 325 mg by mouth daily. (Patient not taking: Reported on 02/10/2022)     No facility-administered medications prior to visit.    ROS: Review of Systems  Constitutional:  Positive for fatigue. Negative for appetite change and unexpected weight change.  HENT:  Negative for congestion, nosebleeds, sneezing, sore throat and trouble swallowing.   Eyes:  Negative for itching and visual disturbance.  Respiratory:  Negative for cough.   Cardiovascular:  Negative for chest pain,  palpitations and leg swelling.  Gastrointestinal:  Negative for abdominal distention, blood in stool, diarrhea and nausea.  Genitourinary:  Negative for frequency and hematuria.  Musculoskeletal:  Negative for back pain, gait problem, joint swelling and neck pain.  Skin:  Positive for color change and rash.  Neurological:  Positive for weakness. Negative for dizziness, tremors and speech difficulty.  Hematological:  Bruises/bleeds easily.  Psychiatric/Behavioral:  Positive for decreased concentration. Negative for agitation, dysphoric mood, sleep disturbance and suicidal ideas. The patient is not nervous/anxious.     Objective:  BP (!) 122/58 (BP Location: Left Arm)   Pulse 60   Temp 98.5 F (36.9 C) (Oral)   Ht '5\' 11"'$  (1.803 m)   SpO2 97%   BMI 25.52 kg/m   BP Readings from Last 3 Encounters:  02/10/22 (!) 122/58  08/17/21 114/62  05/05/21 110/62    Wt Readings from Last 3 Encounters:  12/31/20 183 lb (83 kg)  09/28/20 190 lb 6.4 oz (86.4 kg)  02/19/20 190 lb (86.2 kg)    Physical Exam Constitutional:      General: He is not in acute distress.    Appearance: He is well-developed. He is obese.     Comments: NAD  Eyes:     Conjunctiva/sclera: Conjunctivae normal.     Pupils: Pupils are equal, round, and reactive to light.  Neck:     Thyroid: No  thyromegaly.     Vascular: No JVD.  Cardiovascular:     Rate and Rhythm: Normal rate and regular rhythm.     Heart sounds: Normal heart sounds. No murmur heard.    No friction rub. No gallop.  Pulmonary:     Effort: Pulmonary effort is normal. No respiratory distress.     Breath sounds: Normal breath sounds. No wheezing or rales.  Chest:     Chest wall: No tenderness.  Abdominal:     General: Bowel sounds are normal. There is no distension.     Palpations: Abdomen is soft. There is no mass.     Tenderness: There is no abdominal tenderness. There is no guarding or rebound.  Musculoskeletal:        General: No tenderness.  Normal range of motion.     Cervical back: Normal range of motion.  Lymphadenopathy:     Cervical: No cervical adenopathy.  Skin:    General: Skin is warm and dry.     Findings: No rash.  Neurological:     Mental Status: He is alert and oriented to person, place, and time.     Cranial Nerves: No cranial nerve deficit.     Motor: Weakness present. No abnormal muscle tone.     Coordination: Coordination abnormal.     Gait: Gait abnormal.     Deep Tendon Reflexes: Reflexes are normal and symmetric.  Psychiatric:        Behavior: Behavior normal.        Thought Content: Thought content normal.        Judgment: Judgment normal.    In a w/c Dysarthric Rash on post neck Intertrigo  Lab Results  Component Value Date   WBC 7.1 12/31/2020   HGB 14.9 12/31/2020   HCT 44.7 12/31/2020   PLT 133.0 (L) 12/31/2020   GLUCOSE 108 (H) 08/17/2021   CHOL 189 12/31/2020   TRIG 73.0 12/31/2020   HDL 45.60 12/31/2020   LDLDIRECT 136.2 08/01/2007   LDLCALC 129 (H) 12/31/2020   ALT 7 08/17/2021   AST 15 08/17/2021   NA 138 08/17/2021   K 3.9 08/17/2021   CL 102 08/17/2021   CREATININE 1.30 08/17/2021   BUN 19 08/17/2021   CO2 31 08/17/2021   TSH 2.35 12/31/2020   PSA 4.88 (H) 05/20/2019   INR 1.2 09/06/2019   HGBA1C 5.6 08/17/2021    CT HEAD WO CONTRAST  Result Date: 09/10/2019 CLINICAL DATA:  Follow-up left basal ganglia hemorrhage. Increased lethargy. EXAM: CT HEAD WITHOUT CONTRAST TECHNIQUE: Contiguous axial images were obtained from the base of the skull through the vertex without intravenous contrast. COMPARISON:  09/04/2019 FINDINGS: Brain: Slight enlargement of the left basal ganglia intraparenchymal hemorrhage, measuring 4.4 x 2.5 x 3.6 cm (volume = 21 cm^3) today compared with 16 cc volume 6 days ago. Additionally, there is intraventricular penetration, having breach the atrium of the left lateral ventricle, with a small amount of blood dependent in both occipital horns.  Ventricular size remains stable however without hydrocephalus. Surrounding edema is slightly more pronounced. One could question if there has been extension of infarction more anteriorly within the basal ganglia and external capsule or if this relates to edema from the hemorrhage. Other small-vessel ischemic changes throughout brain appear stable. No new large vessel infarction. No extra-axial collection. Vascular: There is atherosclerotic calcification of the major vessels at the base of the brain. Skull: Negative Sinuses/Orbits: Clear/normal Other: None IMPRESSION: Slight enlargement of the left basal ganglia intraparenchymal  hemorrhage when compared to the study of 6 days ago. Previous volume was calculated at 16 cc. Today calculated at 21 cc. There is also intraventricular penetration with a small amount of blood dependent in the occipital horns of both lateral ventricles but no hydrocephalus. Slightly more surrounding edema as expected. Some edema extends anterior into the basal ganglia and external capsule region, which could either be extension of edema or slight extension of the infarction itself. These results will be called to the ordering clinician or representative by the Radiologist Assistant, and communication documented in the PACS or [employer]. Electronically Signed   By: Nelson Chimes M.D.   On: 09/10/2019 16:57   DG Chest 2 View  Result Date: 09/10/2019 CLINICAL DATA:  Fever EXAM: CHEST - 2 VIEW COMPARISON:  09/04/2019 FINDINGS: Prior CABG and aortic valve replacement. The heart size and mediastinal contours are stable. Low lung volumes. Increased interstitial markings within the left lung base, slightly progressed from prior. No pneumothorax. IMPRESSION: Increased interstitial markings within the left lung base, slightly progressed from prior. Findings could represent atelectasis versus pneumonia. Electronically Signed   By: Davina Poke D.O.   On: 09/10/2019 16:33   DG Abd 1  View  Result Date: 09/10/2019 CLINICAL DATA:  Abdominal distension. EXAM: ABDOMEN - 1 VIEW COMPARISON:  None. FINDINGS: The bowel gas pattern is normal. Residual contrast is noted in the colon. No radio-opaque calculi or other significant radiographic abnormality are seen. IMPRESSION: No evidence of bowel obstruction or ileus. Electronically Signed   By: Marijo Conception M.D.   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    No change      Dyslipidemia    Statin intolerant - all Cont on Zetia      Relevant Orders   TSH   Elevated LFTs   Relevant Orders   Comprehensive metabolic panel   Essential hypertension    On Atenolol      Relevant Medications   aspirin EC (ASPIRIN 81) 81 MG tablet   Hemiplegia and hemiparesis following cerebral infarction affecting right dominant side (HCC)    Statin intolerant - all Cont on Zetia BP control      Hyperglycemia   Relevant Orders   Urinalysis   Hemoglobin A1c   Intertrigo    Diflucan po x10 d      Polycythemia   Relevant Orders   Urinalysis   CBC with Differential/Platelet   Rash    Rash on post neck Cleocin gel Rx      Other Visit Diagnoses     Needs flu shot    -  Primary   Relevant Orders   Flu Vaccine QUAD High Dose(Fluad) (Completed)         Meds ordered this encounter  Medications   clindamycin (CLINDAGEL) 1 % gel    Sig: Apply topically 2 (two) times daily. Use bid on the back of the neck    Dispense:  60 g    Refill:  1   fluconazole (DIFLUCAN) 100 MG tablet    Sig: Take 2 tabs on day#1, then 1 tab daily on Days #2-10    Dispense:  11 tablet    Refill:  1  Follow-up: Return in about 4 months (around 06/12/2022) for a follow-up visit.  Walker Kehr, MD

## 2022-02-10 NOTE — Assessment & Plan Note (Signed)
Diflucan po x10 d

## 2022-02-10 NOTE — Assessment & Plan Note (Signed)
On Atenolol

## 2022-02-10 NOTE — Assessment & Plan Note (Signed)
Statin intolerant - all Cont on Zetia BP control

## 2022-02-11 ENCOUNTER — Other Ambulatory Visit (INDEPENDENT_AMBULATORY_CARE_PROVIDER_SITE_OTHER): Payer: Medicare Other

## 2022-02-11 DIAGNOSIS — D751 Secondary polycythemia: Secondary | ICD-10-CM | POA: Diagnosis not present

## 2022-02-11 DIAGNOSIS — I259 Chronic ischemic heart disease, unspecified: Secondary | ICD-10-CM

## 2022-02-11 DIAGNOSIS — R739 Hyperglycemia, unspecified: Secondary | ICD-10-CM | POA: Diagnosis not present

## 2022-02-11 DIAGNOSIS — R7989 Other specified abnormal findings of blood chemistry: Secondary | ICD-10-CM | POA: Diagnosis not present

## 2022-02-11 DIAGNOSIS — E785 Hyperlipidemia, unspecified: Secondary | ICD-10-CM | POA: Diagnosis not present

## 2022-02-11 LAB — CBC WITH DIFFERENTIAL/PLATELET
Basophils Absolute: 0 10*3/uL (ref 0.0–0.1)
Basophils Relative: 0.5 % (ref 0.0–3.0)
Eosinophils Absolute: 0.6 10*3/uL (ref 0.0–0.7)
Eosinophils Relative: 8.6 % — ABNORMAL HIGH (ref 0.0–5.0)
HCT: 44.5 % (ref 39.0–52.0)
Hemoglobin: 15 g/dL (ref 13.0–17.0)
Lymphocytes Relative: 27.4 % (ref 12.0–46.0)
Lymphs Abs: 2 10*3/uL (ref 0.7–4.0)
MCHC: 33.6 g/dL (ref 30.0–36.0)
MCV: 93.1 fl (ref 78.0–100.0)
Monocytes Absolute: 0.6 10*3/uL (ref 0.1–1.0)
Monocytes Relative: 8.1 % (ref 3.0–12.0)
Neutro Abs: 4.1 10*3/uL (ref 1.4–7.7)
Neutrophils Relative %: 55.4 % (ref 43.0–77.0)
Platelets: 132 10*3/uL — ABNORMAL LOW (ref 150.0–400.0)
RBC: 4.78 Mil/uL (ref 4.22–5.81)
RDW: 13.9 % (ref 11.5–15.5)
WBC: 7.4 10*3/uL (ref 4.0–10.5)

## 2022-02-11 LAB — COMPREHENSIVE METABOLIC PANEL
ALT: 7 U/L (ref 0–53)
AST: 11 U/L (ref 0–37)
Albumin: 3.7 g/dL (ref 3.5–5.2)
Alkaline Phosphatase: 64 U/L (ref 39–117)
BUN: 12 mg/dL (ref 6–23)
CO2: 28 mEq/L (ref 19–32)
Calcium: 9.2 mg/dL (ref 8.4–10.5)
Chloride: 101 mEq/L (ref 96–112)
Creatinine, Ser: 1.16 mg/dL (ref 0.40–1.50)
GFR: 53.16 mL/min — ABNORMAL LOW (ref >60.00)
Glucose, Bld: 109 mg/dL — ABNORMAL HIGH (ref 70–99)
Potassium: 4 mEq/L (ref 3.5–5.1)
Sodium: 138 mEq/L (ref 135–145)
Total Bilirubin: 0.7 mg/dL (ref 0.2–1.2)
Total Protein: 7 g/dL (ref 6.0–8.3)

## 2022-02-11 LAB — URINALYSIS
Bilirubin Urine: NEGATIVE
Hgb urine dipstick: NEGATIVE
Ketones, ur: NEGATIVE
Leukocytes,Ua: NEGATIVE
Nitrite: NEGATIVE
Specific Gravity, Urine: 1.01 (ref 1.000–1.030)
Total Protein, Urine: NEGATIVE
Urine Glucose: NEGATIVE
Urobilinogen, UA: 2 — AB (ref 0.0–1.0)
pH: 7.5 (ref 5.0–8.0)

## 2022-02-11 LAB — HEMOGLOBIN A1C: Hgb A1c MFr Bld: 5.7 % (ref 4.6–6.5)

## 2022-02-11 LAB — TSH: TSH: 2.92 u[IU]/mL (ref 0.35–5.50)

## 2022-02-18 ENCOUNTER — Ambulatory Visit (INDEPENDENT_AMBULATORY_CARE_PROVIDER_SITE_OTHER): Payer: Medicare Other

## 2022-02-18 VITALS — Ht 71.0 in | Wt 188.0 lb

## 2022-02-18 DIAGNOSIS — Z0000 Encounter for general adult medical examination without abnormal findings: Secondary | ICD-10-CM | POA: Diagnosis not present

## 2022-02-18 NOTE — Patient Instructions (Signed)
David [name] , Thank you for taking time to come for your Medicare Wellness Visit. I appreciate your ongoing commitment to your health goals. Please review the following plan we discussed and let me know if I can assist you in the future.   These are the goals we discussed:  Goals      Exercise 3x per week (30 min per time)     Patient Stated     Continue to eat well be active physically and socially. Continue to travel in my motor home, enjoy life, family and be active in the [employer].     Track and Manage My Blood Pressure-Hypertension     Timeframe:  Long-Range Goal Priority:  High Start Date: 03/19/2021                            Expected End Date: 03/19/2022                      Follow Up Date 06/16/2021   - check blood pressure weekly - choose a place to take my blood pressure (home, clinic or office, retail store) - write blood pressure results in a log or diary    Why is this important?   You won't feel high blood pressure, but it can still hurt your blood vessels.  High blood pressure can cause heart or kidney problems. It can also cause a stroke.  Making lifestyle changes like losing a little weight or eating less salt will help.  Checking your blood pressure at home and at different times of the day can help to control blood pressure.  If the doctor prescribes medicine remember to take it the way the doctor ordered.  Call the office if you cannot afford the medicine or if there are questions about it.          This is a list of the screening recommended for you and due dates:  Health Maintenance  Topic Date Due   Zoster (Shingles) Vaccine (1 of 2) Never done   COVID-19 Vaccine (3 - Pfizer risk series) 07/24/2019   Tetanus Vaccine  05/03/2021   Pneumonia Vaccine  Completed   Flu Shot  Completed   HPV Vaccine  Aged Out    Advanced directives: In Chart   Conditions/risks identified:Aim for 30 minutes of exercise or brisk walking, 6-8 glasses of water, and 5  servings of fruits and vegetables each day.   Next appointment: Follow up in one year for your annual wellness visit.   Preventive Care 68 Years and Older, Male  Preventive care refers to lifestyle choices and visits with your health care provider that can promote health and wellness. What does preventive care include? A yearly physical exam. This is also called an annual well check. Dental exams once or twice a year. Routine eye exams. Ask your health care provider how often you should have your eyes checked. Personal lifestyle choices, including: Daily care of your teeth and gums. Regular physical activity. Eating a healthy diet. Avoiding tobacco and drug use. Limiting alcohol use. Practicing safe sex. Taking low doses of aspirin every day. Taking vitamin and mineral supplements as recommended by your health care provider. What happens during an annual well check? The services and screenings done by your health care provider during your annual well check will depend on your age, overall health, lifestyle risk factors, and family history of disease. Counseling  Your health care  provider may ask you questions about your: Alcohol use. Tobacco use. Drug use. Emotional well-being. Home and relationship well-being. Sexual activity. Eating habits. History of falls. Memory and ability to understand (cognition). Work and work Statistician. Screening  You may have the following tests or measurements: Height, weight, and BMI. Blood pressure. Lipid and cholesterol levels. These may be checked every 5 years, or more frequently if you are over 66 years old. Skin check. Lung cancer screening. You may have this screening every year starting at age 55 if you have a 30-pack-year history of smoking and currently smoke or have quit within the past 15 years. Fecal occult blood test (FOBT) of the stool. You may have this test every year starting at age 40. Flexible sigmoidoscopy or colonoscopy.  You may have a sigmoidoscopy every 5 years or a colonoscopy every 10 years starting at age 50. Prostate cancer screening. Recommendations will vary depending on your family history and other risks. Hepatitis C blood test. Hepatitis B blood test. Sexually transmitted disease (STD) testing. Diabetes screening. This is done by checking your blood sugar (glucose) after you have not eaten for a while (fasting). You may have this done every 1-3 years. Abdominal aortic aneurysm (AAA) screening. You may need this if you are a current or former smoker. Osteoporosis. You may be screened starting at age 90 if you are at high risk. Talk with your health care provider about your test results, treatment options, and if necessary, the need for more tests. Vaccines  Your health care provider may recommend certain vaccines, such as: Influenza vaccine. This is recommended every year. Tetanus, diphtheria, and acellular pertussis (Tdap, Td) vaccine. You may need a Td booster every 10 years. Zoster vaccine. You may need this after age 40. Pneumococcal 13-valent conjugate (PCV13) vaccine. One dose is recommended after age 66. Pneumococcal polysaccharide (PPSV23) vaccine. One dose is recommended after age 78. Talk to your health care provider about which screenings and vaccines you need and how often you need them. This information is not intended to replace advice given to you by your health care provider. Make sure you discuss any questions you have with your health care provider. Document Released: 05/29/2015 Document Revised: 01/20/2016 Document Reviewed: 03/03/2015 Elsevier Interactive Patient Education  2017 [place] Prevention in the Home Falls can cause injuries. They can happen to people of all ages. There are many things you can do to make your home safe and to help prevent falls. What can I do on the outside of my home? Regularly fix the edges of walkways and driveways and fix any  cracks. Remove anything that might make you trip as you walk through a door, such as a raised step or threshold. Trim any bushes or trees on the path to your home. Use bright outdoor lighting. Clear any walking paths of anything that might make someone trip, such as rocks or tools. Regularly check to see if handrails are loose or broken. Make sure that both sides of any steps have handrails. Any raised decks and porches should have guardrails on the edges. Have any leaves, snow, or ice cleared regularly. Use sand or salt on walking paths during winter. Clean up any spills in your garage right away. This includes oil or grease spills. What can I do in the bathroom? Use night lights. Install grab bars by the toilet and in the tub and shower. Do not use towel bars as grab bars. Use non-skid mats or decals in the tub  or shower. If you need to sit down in the shower, use a plastic, non-slip stool. Keep the floor dry. Clean up any water that spills on the floor as soon as it happens. Remove soap buildup in the tub or shower regularly. Attach bath mats securely with double-sided non-slip rug tape. Do not have throw rugs and other things on the floor that can make you trip. What can I do in the bedroom? Use night lights. Make sure that you have a light by your bed that is easy to reach. Do not use any sheets or blankets that are too big for your bed. They should not hang down onto the floor. Have a firm chair that has side arms. You can use this for support while you get dressed. Do not have throw rugs and other things on the floor that can make you trip. What can I do in the kitchen? Clean up any spills right away. Avoid walking on wet floors. Keep items that you use a lot in easy-to-reach places. If you need to reach something above you, use a strong step stool that has a grab bar. Keep electrical cords out of the way. Do not use floor polish or wax that makes floors slippery. If you must  use wax, use non-skid floor wax. Do not have throw rugs and other things on the floor that can make you trip. What can I do with my stairs? Do not leave any items on the stairs. Make sure that there are handrails on both sides of the stairs and use them. Fix handrails that are broken or loose. Make sure that handrails are as long as the stairways. Check any carpeting to make sure that it is firmly attached to the stairs. Fix any carpet that is loose or worn. Avoid having throw rugs at the top or bottom of the stairs. If you do have throw rugs, attach them to the floor with carpet tape. Make sure that you have a light switch at the top of the stairs and the bottom of the stairs. If you do not have them, ask someone to add them for you. What else can I do to help prevent falls? Wear shoes that: Do not have high heels. Have rubber bottoms. Are comfortable and fit you well. Are closed at the toe. Do not wear sandals. If you use a stepladder: Make sure that it is fully opened. Do not climb a closed stepladder. Make sure that both sides of the stepladder are locked into place. Ask someone to hold it for you, if possible. Clearly mark and make sure that you can see: Any grab bars or handrails. First and last steps. Where the edge of each step is. Use tools that help you move around (mobility aids) if they are needed. These include: Canes. Walkers. Scooters. Crutches. Turn on the lights when you go into a dark area. Replace any light bulbs as soon as they burn out. Set up your furniture so you have a clear path. Avoid moving your furniture around. If any of your floors are uneven, fix them. If there are any pets around you, be aware of where they are. Review your medicines with your doctor. Some medicines can make you feel dizzy. This can increase your chance of falling. Ask your doctor what other things that you can do to help prevent falls. This information is not intended to replace  advice given to you by your health care provider. Make sure you discuss any questions  you have with your health care provider. Document Released: 02/26/2009 Document Revised: 10/08/2015 Document Reviewed: 06/06/2014 Elsevier Interactive Patient Education  2017 [employer].

## 2022-02-18 NOTE — Progress Notes (Addendum)
Subjective:   David [name] is a 88 y.o. male who presents for Medicare Annual/Subsequent preventive examination.   Virtual Visit via Telephone Note  I connected with  Stefano Gaul on 02/18/22 at  8:45 AM EDT by telephone and verified that I am speaking with the correct person using two identifiers.  Location: Patient: home  Provider: Janett Billow  Persons participating in the virtual visit: patient/Nurse Health Advisor   I discussed the limitations, risks, security and privacy concerns of performing an evaluation and management service by telephone and the availability of in person appointments. The patient expressed understanding and agreed to proceed.  Interactive audio and video telecommunications were attempted between this nurse and patient, however failed, due to patient having technical difficulties OR patient did not have access to video capability.  We continued and completed visit with audio only.  Some vital signs may be absent or patient reported.   Daphane Shepherd, LPN  Review of Systems     Cardiac Risk Factors include: advanced age (>99mn, >>54women);hypertension;male gender     Objective:    Today's Vitals   02/18/22 0855  Weight: 188 lb (85.3 kg)  Height: '5\' 11"'$  (1.803 m)   Body mass index is 26.22 kg/m.     02/18/2022    8:59 AM 06/05/2020   11:40 AM 12/18/2019    9:16 AM 10/08/2019    4:58 PM 09/09/2019   10:54 PM 09/04/2019    3:00 PM 12/04/2017    8:27 AM  Advanced Directives  Does Patient Have a Medical Advance Directive? Yes Yes Yes Yes Yes Yes Yes  Type of AParamed[place]of [place] will  [place] will;Out of facility DNR (pink MOST or yellow form) Out of facility DNR (pink MOST or yellow form) [place]Living will [place]Living will [place]Living will  Does patient want to make changes to medical advance directive? No - Patient  declined No - Patient declined No - Patient declined No - Patient declined No - Patient declined No - Patient declined   Copy of [place]in Chart? Yes - validated most recent copy scanned in chart (See row information)  Yes - validated most recent copy scanned in chart (See row information)  No - copy requested No - copy requested No - copy requested    Current Medications (verified) Outpatient Encounter Medications as of 02/18/2022  Medication Sig   acetaminophen (TYLENOL) 325 MG tablet Take 1-2 tablets (325-650 mg total) by mouth every 4 (four) hours as needed for mild pain.   aspirin EC (ASPIRIN 81) 81 MG tablet Take 81 mg by mouth daily. Swallow whole.   atenolol (TENORMIN) 25 MG tablet Take 0.5 tablets (12.5 mg total) by mouth daily.   clindamycin (CLINDAGEL) 1 % gel Apply topically 2 (two) times daily. Use bid on the back of the neck   clotrimazole-betamethasone (LOTRISONE) cream APPLY TO AFFECTED AREA TWICE A DAY   escitalopram (LEXAPRO) 10 MG tablet TAKE 1 TABLET BY MOUTH EVERY DAY   ezetimibe (ZETIA) 10 MG tablet Take 1 tablet by mouth daily.   fluconazole (DIFLUCAN) 100 MG tablet Take 2 tabs on day#1, then 1 tab daily on Days #2-10   lactulose (CHRONULAC) 10 GM/15ML solution 30-60 ml bid po prn   OVER THE COUNTER MEDICATION 2 capsules daily. Lions mane capsules   pantoprazole (PROTONIX) 40 MG tablet Take 1 tablet (40 mg total) by mouth  daily. Annual appt w/labs are due Aug must see provider for future refills   polyethylene glycol (MIRALAX / GLYCOLAX) 17 g packet Take 17 g by mouth daily.   senna-docusate (SENOKOT-S) 8.6-50 MG tablet Take 2 tablets by mouth 2 (two) times daily. (Patient taking differently: Take 2 tablets by mouth daily.)   No facility-administered encounter medications on file as of 02/18/2022.    Allergies (verified) Atorvastatin, Colesevelam, Simvastatin, and Sulfa antibiotics   History: Past Medical History:  Diagnosis Date   CAD  (coronary artery disease)    intolerance of statins   COPD (chronic obstructive pulmonary disease) (HCC)    Current use of long term anticoagulation    GERD (gastroesophageal reflux disease)    Hyperlipidemia    Hypertension    Impaired glucose tolerance 02/14/2011   LBP (low back pain)    OA (osteoarthritis)    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   Past Surgical History:  Procedure Laterality Date   AORTIC VALVE REPLACEMENT  01/15/1999   bovine   CARDIAC CATHETERIZATION  10/05/1998   Recommend CABG revascularizaition   CARDIAC CATHETERIZATION  06/11/1999   Continue medical therapy   CARDIAC CATHETERIZATION  01/08/2007   Patent grafts, recommend medical therapy   CARDIOVASCULAR STRESS TEST  04/22/2010   Small lateral defect which is partially reversible, no ischemic EKG changes were noted.   CAROTID DOPPLER  04/22/2010   Bilateral ICAs-no evidence of diametere reduction, significant tortuosity, or any other vascular abnormality.   CORONARY ARTERY BYPASS GRAFT  10/09/1998   LIMA to LAD, SVG to diag, SVG sequential to 2 marginals, SVG sequential to PDA & PLA. CE#25 porcine AVR.   TRANSTHORACIC ECHOCARDIOGRAM  05/14/2012   EF 50-55%, bioprosthesis of the aortic valve with well preserved LV function, moderate concentric Nye   Family History  Problem Relation Age of Onset   Coronary artery disease Other    Hypertension Other    Social History   Socioeconomic History   Marital status: Widowed    Spouse name: Not on file   Number of children: Not on file   Years of education: Not on file   Highest education level: Not on file  Occupational History   Not on file  Tobacco Use   Smoking status: Former    Types: Pipe    Quit date: 05/16/1998    Years since quitting: 23.7   Smokeless tobacco: Former  Scientific laboratory technician Use: Never used  Substance and Sexual Activity   Alcohol use: No   Drug use: No   Sexual activity: Never  Other Topics Concern   Not on file   Social History Narrative   Not on file   Social Determinants of Health   Financial Resource Strain: Low Risk  (02/18/2022)   Overall Financial Resource Strain (CARDIA)    Difficulty of Paying Living Expenses: Not hard at all  Food Insecurity: No Food Insecurity (02/18/2022)   Hunger Vital Sign    Worried About Running Out of Food in the Last Year: Never true    Halfway House in the Last Year: Never true  Transportation Needs: No Transportation Needs (02/18/2022)   PRAPARE - Hydrologist (Medical): No    Lack of Transportation (Non-Medical): No  Physical Activity: Inactive (02/18/2022)   Exercise Vital Sign    Days of Exercise per Week: 0 days    Minutes of Exercise per Session: 0 min  Stress: No  Stress Concern Present (02/18/2022)   [place]    Feeling of Stress : Not at all  Social Connections: Socially Isolated (02/18/2022)   Social Connection and Isolation Panel [NHANES]    Frequency of Communication with Friends and Family: More than three times a week    Frequency of Social Gatherings with Friends and Family: More than three times a week    Attends Religious Services: Never    Marine scientist or Organizations: No    Attends Archivist Meetings: Never    Marital Status: Widowed    Tobacco Counseling Counseling given: Not Answered   Clinical Intake:  Pre-visit preparation completed: Yes  Pain : No/denies pain     Nutritional Risks: None Diabetes: No  How often do you need to have someone help you when you read instructions, pamphlets, or other written materials from your doctor or pharmacy?: 1 - Never  Diabetic?no  Interpreter Needed?: No  Information entered by :: Jadene Pierini, LPN   Activities of Daily Living    02/18/2022    8:59 AM  In your present state of health, do you have any difficulty performing the following activities:  Hearing? 0   Vision? 0  Difficulty concentrating or making decisions? 0  Walking or climbing stairs? 0  Dressing or bathing? 0  Doing errands, shopping? 0  Preparing Food and eating ? N  Using the Toilet? N  In the past six months, have you accidently leaked urine? N  Do you have problems with loss of bowel control? N  Managing your Medications? N  Managing your Finances? N  Housekeeping or managing your Housekeeping? N    Patient Care Team: Plotnikov, [name], MD as PCP - General (Internal Medicine) Troy Sine, MD as PCP - Cardiology (Cardiology) Meredith Staggers, MD as Consulting Physician (Physical Medicine and Rehabilitation) [facility] as Consulting Physician (Optometry) Szabat, Darnelle Maffucci, [facility] (Inactive) as Pharmacist (Pharmacist)  Indicate any recent Medical Services you may have received from other than [facility] providers in the past year (date may be approximate).     Assessment:   This is a routine wellness examination for [place].  Hearing/Vision screen Vision Screening - Comments:: Annual eye exams   Dietary issues and exercise activities discussed: Current Exercise Habits: The patient does not participate in regular exercise at present, Exercise limited by: cardiac condition(s)   Goals Addressed             This Visit's Progress    Exercise 3x per week (30 min per time)         Depression Screen    02/18/2022    8:58 AM 02/10/2022    3:11 PM 08/17/2021    9:05 AM 06/29/2020   11:33 AM 06/05/2020   11:38 AM 02/19/2020   10:38 AM 10/30/2019   11:34 AM  PHQ 2/9 Scores  PHQ - 2 Score 0 1 0 0 0 0 1  PHQ- 9 Score 0 5 0 0   8    Fall Risk    02/18/2022    8:56 AM 02/10/2022    3:11 PM 08/19/2020    3:29 PM 06/05/2020   11:40 AM 04/22/2020   11:34 AM  Fall Risk   Falls in the past year? 1 1 0 0 1  Number falls in past yr: 1 1  0 0  Injury with Fall? 1 1  0 0  Risk for  fall due to : History of fall(s);Impaired balance/gait;Orthopedic patient History  of fall(s);Impaired balance/gait;Impaired mobility;Impaired vision  Impaired balance/gait;Impaired mobility   Risk for fall due to: Comment in wheel Chair      Follow up Education provided;Falls prevention discussed        FALL RISK PREVENTION PERTAINING TO THE HOME:  Any stairs in or around the home? No  If so, are there any without handrails? No  Home free of loose throw rugs in walkways, pet beds, electrical cords, etc? Yes  Adequate lighting in your home to reduce risk of falls? Yes   ASSISTIVE DEVICES UTILIZED TO PREVENT FALLS:  Life alert? No  Use of a cane, walker or w/c? Yes  Grab bars in the bathroom? Yes  Shower chair or bench in shower? Yes  Elevated toilet seat or a handicapped toilet? Yes       12/04/2017    8:29 AM  MMSE - Mini Mental State Exam  Orientation to time 5  Orientation to Place 5  Registration 3  Attention/ Calculation 5  Recall 1  Language- name 2 objects 2  Language- repeat 1  Language- follow 3 step command 3  Language- read & follow direction 1  Write a sentence 1  Copy design 1  Total score 28        02/18/2022    8:59 AM  6CIT Screen  What Year? 0 points  What month? 0 points  What time? 0 points  Count back from 20 0 points  Months in reverse 2 points  Repeat phrase 2 points  Total Score 4 points    Immunizations Immunization History  Administered Date(s) Administered   Fluad Quad(high Dose 65+) 01/29/2020, 05/05/2021, 02/10/2022   Influenza Whole 02/17/2011, 02/14/2012   Influenza-Unspecified 02/13/2001, 02/13/2002, 02/14/2003, 02/13/2013, 02/12/2014, 02/14/2015, 02/27/2018, 04/21/2021   PFIZER(Purple Top)SARS-COV-2 Vaccination 06/05/2019, 06/26/2019   Pneumococcal Conjugate-13 11/14/2013   Pneumococcal Polysaccharide-23 03/09/2012   Pneumococcal-Unspecified 08/15/1998   Td 05/04/2011    TDAP status: Due, Education has been provided regarding the importance of this vaccine. Advised may receive this vaccine at local  pharmacy or Health Dept. Aware to provide a copy of the vaccination record if obtained from local pharmacy or Health Dept. Verbalized acceptance and understanding.  Flu Vaccine status: Up to date  Pneumococcal vaccine status: Up to date  Covid-19 vaccine status: Completed vaccines  Qualifies for Shingles Vaccine? Yes   Zostavax completed No   Shingrix Completed?: No.    Education has been provided regarding the importance of this vaccine. Patient has been advised to call insurance company to determine out of pocket expense if they have not yet received this vaccine. Advised may also receive vaccine at local pharmacy or Health Dept. Verbalized acceptance and understanding.  Screening Tests Health Maintenance  Topic Date Due   Zoster Vaccines- Shingrix (1 of 2) Never done   COVID-19 Vaccine (3 - Pfizer risk series) 07/24/2019   TETANUS/TDAP  05/03/2021   Pneumonia Vaccine 44+ Years old  Completed   INFLUENZA VACCINE  Completed   HPV VACCINES  Aged Out    Health Maintenance  Health Maintenance Due  Topic Date Due   Zoster Vaccines- Shingrix (1 of 2) Never done   COVID-19 Vaccine (3 - Pfizer risk series) 07/24/2019   TETANUS/TDAP  05/03/2021    Colorectal cancer screening: No longer required.   Lung Cancer Screening: (Low Dose CT Chest recommended if Age 65-80 years, 30 pack-year currently smoking OR have quit w/in 15years.) does not  qualify.   Lung Cancer Screening Referral: n/a  Additional Screening:  Hepatitis C Screening: does not qualify;   Vision Screening: Recommended annual ophthalmology exams for early detection of glaucoma and other disorders of the eye. Is the patient up to date with their annual eye exam?  Yes  Who is the provider or what is the name of the office in which the patient attends annual eye exams? Dr.Groat  If pt is not established with a provider, would they like to be referred to a provider to establish care? No .   Dental Screening: Recommended  annual dental exams for proper oral hygiene  Community Resource Referral / Chronic Care Management: CRR required this visit?  No   CCM required this visit?  No      Plan:     I have personally reviewed and noted the following in the patient's chart:   Medical and social history Use of alcohol, tobacco or illicit drugs  Current medications and supplements including opioid prescriptions. Patient is not currently taking opioid prescriptions. Functional ability and status Nutritional status Physical activity Advanced directives List of other physicians Hospitalizations, surgeries, and ER visits in previous 12 months Vitals Screenings to include cognitive, depression, and falls Referrals and appointments  In addition, I have reviewed and discussed with patient certain preventive protocols, quality metrics, and best practice recommendations. A written personalized care plan for preventive services as well as general preventive health recommendations were provided to patient.     Daphane Shepherd, LPN   [id number]/[id number]   Nurse Notes: Due TDAP Vaccine   Medical screening examination/treatment/procedure(s) were performed by non-physician practitioner and as supervising physician I was immediately available for consultation/collaboration.  I agree with above. Lew Dawes, MD

## 2022-02-21 ENCOUNTER — Other Ambulatory Visit: Payer: Self-pay | Admitting: Internal Medicine

## 2022-02-21 DIAGNOSIS — I10 Essential (primary) hypertension: Secondary | ICD-10-CM

## 2022-04-12 ENCOUNTER — Other Ambulatory Visit: Payer: Self-pay | Admitting: Internal Medicine

## 2022-06-04 ENCOUNTER — Other Ambulatory Visit: Payer: Self-pay | Admitting: Internal Medicine

## 2022-06-13 ENCOUNTER — Other Ambulatory Visit: Payer: Self-pay | Admitting: Internal Medicine

## 2022-06-14 ENCOUNTER — Ambulatory Visit (INDEPENDENT_AMBULATORY_CARE_PROVIDER_SITE_OTHER): Payer: Medicare Other | Admitting: Internal Medicine

## 2022-06-14 ENCOUNTER — Encounter: Payer: Self-pay | Admitting: Internal Medicine

## 2022-06-14 VITALS — BP 122/78 | HR 72 | Temp 98.0°F | Ht 71.0 in

## 2022-06-14 DIAGNOSIS — N179 Acute kidney failure, unspecified: Secondary | ICD-10-CM

## 2022-06-14 DIAGNOSIS — I10 Essential (primary) hypertension: Secondary | ICD-10-CM | POA: Diagnosis not present

## 2022-06-14 DIAGNOSIS — R21 Rash and other nonspecific skin eruption: Secondary | ICD-10-CM | POA: Diagnosis not present

## 2022-06-14 DIAGNOSIS — G8111 Spastic hemiplegia affecting right dominant side: Secondary | ICD-10-CM

## 2022-06-14 DIAGNOSIS — E785 Hyperlipidemia, unspecified: Secondary | ICD-10-CM

## 2022-06-14 DIAGNOSIS — R739 Hyperglycemia, unspecified: Secondary | ICD-10-CM | POA: Diagnosis not present

## 2022-06-14 LAB — COMPREHENSIVE METABOLIC PANEL
ALT: 6 U/L (ref 0–53)
AST: 12 U/L (ref 0–37)
Albumin: 3.8 g/dL (ref 3.5–5.2)
Alkaline Phosphatase: 65 U/L (ref 39–117)
BUN: 12 mg/dL (ref 6–23)
CO2: 30 mEq/L (ref 19–32)
Calcium: 9.3 mg/dL (ref 8.4–10.5)
Chloride: 101 mEq/L (ref 96–112)
Creatinine, Ser: 1.05 mg/dL (ref 0.40–1.50)
GFR: 59.77 mL/min — ABNORMAL LOW (ref >60.00)
Glucose, Bld: 104 mg/dL — ABNORMAL HIGH (ref 70–99)
Potassium: 4.1 mEq/L (ref 3.5–5.1)
Sodium: 138 mEq/L (ref 135–145)
Total Bilirubin: 0.6 mg/dL (ref 0.2–1.2)
Total Protein: 7.3 g/dL (ref 6.0–8.3)

## 2022-06-14 LAB — CBC WITH DIFFERENTIAL/PLATELET
Basophils Absolute: 0 10*3/uL (ref 0.0–0.1)
Basophils Relative: 0.3 % (ref 0.0–3.0)
Eosinophils Absolute: 0.2 10*3/uL (ref 0.0–0.7)
Eosinophils Relative: 3.1 % (ref 0.0–5.0)
HCT: 48 % (ref 39.0–52.0)
Hemoglobin: 16.2 g/dL (ref 13.0–17.0)
Lymphocytes Relative: 24.9 % (ref 12.0–46.0)
Lymphs Abs: 1.9 10*3/uL (ref 0.7–4.0)
MCHC: 33.8 g/dL (ref 30.0–36.0)
MCV: 92.9 fl (ref 78.0–100.0)
Monocytes Absolute: 0.6 10*3/uL (ref 0.1–1.0)
Monocytes Relative: 8.3 % (ref 3.0–12.0)
Neutro Abs: 4.7 10*3/uL (ref 1.4–7.7)
Neutrophils Relative %: 63.4 % (ref 43.0–77.0)
Platelets: 155 10*3/uL (ref 150.0–400.0)
RBC: 5.17 Mil/uL (ref 4.22–5.81)
RDW: 14.1 % (ref 11.5–15.5)
WBC: 7.5 10*3/uL (ref 4.0–10.5)

## 2022-06-14 LAB — TSH: TSH: 2.68 u[IU]/mL (ref 0.35–5.50)

## 2022-06-14 LAB — HEMOGLOBIN A1C: Hgb A1c MFr Bld: 5.6 % (ref 4.6–6.5)

## 2022-06-14 MED ORDER — CLOTRIMAZOLE-BETAMETHASONE 1-0.05 % EX CREA: TOPICAL_CREAM | CUTANEOUS | 1 refills | Status: DC

## 2022-06-14 NOTE — Assessment & Plan Note (Addendum)
Worse He needs a R knee brace to prevent the leg from extension (with flexion and extension stops). Home OT/PT offered

## 2022-06-14 NOTE — Assessment & Plan Note (Signed)
SD on face Lotrisone cream bid

## 2022-06-14 NOTE — Progress Notes (Signed)
Subjective:  Patient ID: David [name], male    DOB: 08/21/1923  Age: 89 y.o. MRN: [id number]  CC: Follow-up (Having concerns about face breaking out and breakouts on hands )   HPI David [name] presents for skin lesions, CVA, HTN C/o R side weakness C/o face rash He needs a R knee brace to prevent the leg from extension   Outpatient Medications Prior to Visit  Medication Sig Dispense Refill   acetaminophen (TYLENOL) 325 MG tablet Take 1-2 tablets (325-650 mg total) by mouth every 4 (four) hours as needed for mild pain.     aspirin EC (ASPIRIN 81) 81 MG tablet Take 81 mg by mouth daily. Swallow whole.     atenolol (TENORMIN) 25 MG tablet TAKE 1/2 TABLET BY MOUTH EVERY DAY 45 tablet 3   clindamycin (CLINDAGEL) 1 % gel APPLY TOPICALLY 2 (TWO) TIMES DAILY. USE TWICE DAILY ON THE BACK OF THE NECK 60 g 1   escitalopram (LEXAPRO) 10 MG tablet TAKE 1 TABLET BY MOUTH EVERY DAY 90 tablet 3   ezetimibe (ZETIA) 10 MG tablet Take 1 tablet by mouth daily.     fluconazole (DIFLUCAN) 100 MG tablet Take 2 tabs on day#1, then 1 tab daily on Days #2-10 11 tablet 1   lactulose (CHRONULAC) 10 GM/15ML solution 30-60 ml bid po prn 1000 mL 5   OVER THE COUNTER MEDICATION 2 capsules daily. [place] mane capsules     pantoprazole (PROTONIX) 40 MG tablet Take 1 tablet (40 mg total) by mouth daily. 90 tablet 3   polyethylene glycol (MIRALAX / GLYCOLAX) 17 g packet Take 17 g by mouth daily.     senna-docusate (SENOKOT-S) 8.6-50 MG tablet Take 2 tablets by mouth 2 (two) times daily. (Patient taking differently: Take 2 tablets by mouth daily.) 60 tablet 0   clotrimazole-betamethasone (LOTRISONE) cream APPLY TO AFFECTED AREA TWICE A DAY 90 g 1   No facility-administered medications prior to visit.    ROS: Review of Systems  Constitutional:  Positive for fatigue. Negative for appetite change and unexpected weight change.  HENT:  Negative for congestion, nosebleeds, sneezing, sore throat and trouble swallowing.    Eyes:  Negative for itching and visual disturbance.  Respiratory:  Negative for cough.   Cardiovascular:  Negative for chest pain, palpitations and leg swelling.  Gastrointestinal:  Negative for abdominal distention, blood in stool, diarrhea and nausea.  Genitourinary:  Negative for frequency and hematuria.  Musculoskeletal:  Positive for gait problem. Negative for back pain, joint swelling and neck pain.  Skin:  Positive for rash.  Neurological:  Positive for weakness. Negative for dizziness, tremors and speech difficulty.  Hematological:  Bruises/bleeds easily.  Psychiatric/Behavioral:  Negative for agitation, dysphoric mood and sleep disturbance. The patient is not nervous/anxious.     Objective:  BP 122/78 (BP Location: Left Arm, Patient Position: Sitting, Cuff Size: Normal)   Pulse 72   Temp 98 F (36.7 C) (Oral)   Ht '5\' 11"'$  (1.803 m)   SpO2 99%   BMI 26.22 kg/m   BP Readings from Last 3 Encounters:  06/14/22 122/78  02/10/22 (!) 122/58  08/17/21 114/62    Wt Readings from Last 3 Encounters:  02/18/22 188 lb (85.3 kg)  12/31/20 183 lb (83 kg)  09/28/20 190 lb 6.4 oz (86.4 kg)    Physical Exam Constitutional:      General: He is not in acute distress.    Appearance: He is well-developed.     Comments: NAD  Eyes:     Conjunctiva/sclera: Conjunctivae normal.     Pupils: Pupils are equal, round, and reactive to light.  Neck:     Thyroid: No thyromegaly.     Vascular: No JVD.  Cardiovascular:     Rate and Rhythm: Normal rate and regular rhythm.     Heart sounds: Normal heart sounds. No murmur heard.    No friction rub. No gallop.  Pulmonary:     Effort: Pulmonary effort is normal. No respiratory distress.     Breath sounds: Normal breath sounds. No wheezing or rales.  Chest:     Chest wall: No tenderness.  Abdominal:     General: Bowel sounds are normal. There is no distension.     Palpations: Abdomen is soft. There is no mass.     Tenderness: There is no  abdominal tenderness. There is no guarding or rebound.  Musculoskeletal:        General: No tenderness. Normal range of motion.     Cervical back: Normal range of motion.     Right lower leg: No edema.     Left lower leg: No edema.  Lymphadenopathy:     Cervical: No cervical adenopathy.  Skin:    General: Skin is warm and dry.     Findings: No rash.  Neurological:     Mental Status: He is alert. Mental status is at baseline.     Cranial Nerves: No cranial nerve deficit.     Motor: Weakness present. No abnormal muscle tone.     Coordination: Coordination normal.     Gait: Gait abnormal.     Deep Tendon Reflexes: Reflexes are normal and symmetric.  Psychiatric:        Behavior: Behavior normal.        Thought Content: Thought content normal.        Judgment: Judgment normal.   R hemiparesis In a w/c SD on face  Lab Results  Component Value Date   WBC 7.4 02/11/2022   HGB 15.0 02/11/2022   HCT 44.5 02/11/2022   PLT 132.0 (L) 02/11/2022   GLUCOSE 109 (H) 02/11/2022   CHOL 189 12/31/2020   TRIG 73.0 12/31/2020   HDL 45.60 12/31/2020   LDLDIRECT 136.2 08/01/2007   LDLCALC 129 (H) 12/31/2020   ALT 7 02/11/2022   AST 11 02/11/2022   NA 138 02/11/2022   K 4.0 02/11/2022   CL 101 02/11/2022   CREATININE 1.16 02/11/2022   BUN 12 02/11/2022   CO2 28 02/11/2022   TSH 2.92 02/11/2022   PSA 4.88 (H) 05/20/2019   INR 1.2 09/06/2019   HGBA1C 5.7 02/11/2022    CT HEAD WO CONTRAST  Result Date: 09/10/2019 CLINICAL DATA:  Follow-up left basal ganglia hemorrhage. Increased lethargy. EXAM: CT HEAD WITHOUT CONTRAST TECHNIQUE: Contiguous axial images were obtained from the base of the skull through the vertex without intravenous contrast. COMPARISON:  09/04/2019 FINDINGS: Brain: Slight enlargement of the left basal ganglia intraparenchymal hemorrhage, measuring 4.4 x 2.5 x 3.6 cm (volume = 21 cm^3) today compared with 16 cc volume 6 days ago. Additionally, there is intraventricular  penetration, having breach the atrium of the left lateral ventricle, with a small amount of blood dependent in both occipital horns. Ventricular size remains stable however without hydrocephalus. Surrounding edema is slightly more pronounced. One could question if there has been extension of infarction more anteriorly within the basal ganglia and external capsule or if this relates to edema from the hemorrhage. Other small-vessel ischemic  changes throughout brain appear stable. No new large vessel infarction. No extra-axial collection. Vascular: There is atherosclerotic calcification of the major vessels at the base of the brain. Skull: Negative Sinuses/Orbits: Clear/normal Other: None IMPRESSION: Slight enlargement of the left basal ganglia intraparenchymal hemorrhage when compared to the study of 6 days ago. Previous volume was calculated at 16 cc. Today calculated at 21 cc. There is also intraventricular penetration with a small amount of blood dependent in the occipital horns of both lateral ventricles but no hydrocephalus. Slightly more surrounding edema as expected. Some edema extends anterior into the basal ganglia and external capsule region, which could either be extension of edema or slight extension of the infarction itself. These results will be called to the ordering clinician or representative by the Radiologist Assistant, and communication documented in the PACS or [employer]. Electronically Signed   By: Nelson Chimes M.D.   On: 09/10/2019 16:57   DG Chest 2 View  Result Date: 09/10/2019 CLINICAL DATA:  Fever EXAM: CHEST - 2 VIEW COMPARISON:  09/04/2019 FINDINGS: Prior CABG and aortic valve replacement. The heart size and mediastinal contours are stable. Low lung volumes. Increased interstitial markings within the left lung base, slightly progressed from prior. No pneumothorax. IMPRESSION: Increased interstitial markings within the left lung base, slightly progressed from prior. Findings  could represent atelectasis versus pneumonia. Electronically Signed   By: Davina Poke D.O.   On: 09/10/2019 16:33   DG Abd 1 View  Result Date: 09/10/2019 CLINICAL DATA:  Abdominal distension. EXAM: ABDOMEN - 1 VIEW COMPARISON:  None. FINDINGS: The bowel gas pattern is normal. Residual contrast is noted in the colon. No radio-opaque calculi or other significant radiographic abnormality are seen. IMPRESSION: No evidence of bowel obstruction or ileus. Electronically Signed   By: Marijo Conception M.D.   On: 09/10/2019 16:32    Assessment & Plan:   Problem List Items Addressed This Visit       Cardiovascular and Mediastinum   Essential hypertension    Chronic  States BP is nl at home      Relevant Orders   TSH   CBC with Differential/Platelet   Comprehensive metabolic panel   Hemoglobin A1c     Nervous and Auditory   Spastic hemiparesis of right dominant side ([place]) - Primary    Worse He needs a R knee brace to prevent the leg from extension (with flexion and extension stops). Home OT/PT offered      Relevant Orders   Ambulatory referral to [facility]     Musculoskeletal and Integument   Rash    SD on face Lotrisone cream bid        Genitourinary   AKI (acute kidney injury) ([place])    BMET        Other   Dyslipidemia   Relevant Orders   TSH   CBC with Differential/Platelet   Comprehensive metabolic panel   Hemoglobin A1c   Hyperglycemia   Relevant Orders   Hemoglobin A1c      Meds ordered this encounter  Medications   clotrimazole-betamethasone (LOTRISONE) cream    Sig: APPLY TO AFFECTED AREA TWICE A DAY    Dispense:  90 g    Refill:  1      Follow-up: Return in about 3 months (around 09/13/2022) for a follow-up visit.  Walker Kehr, MD

## 2022-06-14 NOTE — Assessment & Plan Note (Signed)
BMET 

## 2022-06-14 NOTE — Assessment & Plan Note (Signed)
Chronic  States BP is nl at home

## 2022-06-17 DIAGNOSIS — N179 Acute kidney failure, unspecified: Secondary | ICD-10-CM | POA: Diagnosis not present

## 2022-06-17 DIAGNOSIS — R739 Hyperglycemia, unspecified: Secondary | ICD-10-CM | POA: Diagnosis not present

## 2022-06-17 DIAGNOSIS — E785 Hyperlipidemia, unspecified: Secondary | ICD-10-CM | POA: Diagnosis not present

## 2022-06-17 DIAGNOSIS — I10 Essential (primary) hypertension: Secondary | ICD-10-CM | POA: Diagnosis not present

## 2022-06-17 DIAGNOSIS — G8111 Spastic hemiplegia affecting right dominant side: Secondary | ICD-10-CM | POA: Diagnosis not present

## 2022-06-20 ENCOUNTER — Telehealth: Payer: Self-pay | Admitting: Internal Medicine

## 2022-06-20 DIAGNOSIS — R739 Hyperglycemia, unspecified: Secondary | ICD-10-CM | POA: Diagnosis not present

## 2022-06-20 DIAGNOSIS — E785 Hyperlipidemia, unspecified: Secondary | ICD-10-CM | POA: Diagnosis not present

## 2022-06-20 DIAGNOSIS — G8111 Spastic hemiplegia affecting right dominant side: Secondary | ICD-10-CM | POA: Diagnosis not present

## 2022-06-20 DIAGNOSIS — N179 Acute kidney failure, unspecified: Secondary | ICD-10-CM | POA: Diagnosis not present

## 2022-06-20 DIAGNOSIS — I10 Essential (primary) hypertension: Secondary | ICD-10-CM | POA: Diagnosis not present

## 2022-06-20 NOTE — Telephone Encounter (Signed)
[facility]:  [place]   Phone Number:  [phone]   Needs Verbal orders for what service & frequency:  Ot 1 x a week for 8 weeks starting today.

## 2022-06-20 NOTE — Telephone Encounter (Signed)
Notified [name] ok for verbal for OT. Pt is in good compliance with appt...David [name]

## 2022-06-20 NOTE — Telephone Encounter (Signed)
Okay. Thank you.

## 2022-06-27 DIAGNOSIS — I10 Essential (primary) hypertension: Secondary | ICD-10-CM | POA: Diagnosis not present

## 2022-06-27 DIAGNOSIS — E785 Hyperlipidemia, unspecified: Secondary | ICD-10-CM | POA: Diagnosis not present

## 2022-06-27 DIAGNOSIS — R739 Hyperglycemia, unspecified: Secondary | ICD-10-CM | POA: Diagnosis not present

## 2022-06-27 DIAGNOSIS — N179 Acute kidney failure, unspecified: Secondary | ICD-10-CM | POA: Diagnosis not present

## 2022-06-27 DIAGNOSIS — G8111 Spastic hemiplegia affecting right dominant side: Secondary | ICD-10-CM | POA: Diagnosis not present

## 2022-06-28 DIAGNOSIS — N179 Acute kidney failure, unspecified: Secondary | ICD-10-CM | POA: Diagnosis not present

## 2022-06-28 DIAGNOSIS — I10 Essential (primary) hypertension: Secondary | ICD-10-CM | POA: Diagnosis not present

## 2022-06-28 DIAGNOSIS — E785 Hyperlipidemia, unspecified: Secondary | ICD-10-CM | POA: Diagnosis not present

## 2022-06-28 DIAGNOSIS — R739 Hyperglycemia, unspecified: Secondary | ICD-10-CM | POA: Diagnosis not present

## 2022-06-28 DIAGNOSIS — G8111 Spastic hemiplegia affecting right dominant side: Secondary | ICD-10-CM | POA: Diagnosis not present

## 2022-06-29 ENCOUNTER — Telehealth: Payer: Self-pay | Admitting: Internal Medicine

## 2022-06-29 NOTE — Telephone Encounter (Signed)
[facility] called to get verbal orders for this patient to receive speech therapy. Best call back number is [phone].

## 2022-06-29 NOTE — Telephone Encounter (Signed)
Notified [facility] ok for verbal speech therapy. Pt is in good standing w/ appts.Marland KitchenJohny [name]

## 2022-06-30 DIAGNOSIS — E785 Hyperlipidemia, unspecified: Secondary | ICD-10-CM | POA: Diagnosis not present

## 2022-06-30 DIAGNOSIS — R739 Hyperglycemia, unspecified: Secondary | ICD-10-CM | POA: Diagnosis not present

## 2022-06-30 DIAGNOSIS — G8111 Spastic hemiplegia affecting right dominant side: Secondary | ICD-10-CM | POA: Diagnosis not present

## 2022-06-30 DIAGNOSIS — N179 Acute kidney failure, unspecified: Secondary | ICD-10-CM | POA: Diagnosis not present

## 2022-06-30 DIAGNOSIS — I10 Essential (primary) hypertension: Secondary | ICD-10-CM | POA: Diagnosis not present

## 2022-07-01 ENCOUNTER — Telehealth: Payer: Self-pay | Admitting: Internal Medicine

## 2022-07-01 DIAGNOSIS — R739 Hyperglycemia, unspecified: Secondary | ICD-10-CM | POA: Diagnosis not present

## 2022-07-01 DIAGNOSIS — E785 Hyperlipidemia, unspecified: Secondary | ICD-10-CM | POA: Diagnosis not present

## 2022-07-01 DIAGNOSIS — I10 Essential (primary) hypertension: Secondary | ICD-10-CM | POA: Diagnosis not present

## 2022-07-01 DIAGNOSIS — G8111 Spastic hemiplegia affecting right dominant side: Secondary | ICD-10-CM | POA: Diagnosis not present

## 2022-07-01 DIAGNOSIS — N179 Acute kidney failure, unspecified: Secondary | ICD-10-CM | POA: Diagnosis not present

## 2022-07-01 NOTE — Telephone Encounter (Signed)
[facility]:  [place]   Phone Number:  [phone]   Needs Verbal orders for what service & frequency:  Home Health Speech Therapy  1 week x 4

## 2022-07-01 NOTE — Telephone Encounter (Signed)
Called David [name] no answer David [name] ok for verbal for speech therapy. Pt is compliance w/ appt...David [name]

## 2022-07-03 NOTE — Telephone Encounter (Signed)
Okay.  Thanks.

## 2022-07-04 DIAGNOSIS — I10 Essential (primary) hypertension: Secondary | ICD-10-CM | POA: Diagnosis not present

## 2022-07-04 DIAGNOSIS — R739 Hyperglycemia, unspecified: Secondary | ICD-10-CM | POA: Diagnosis not present

## 2022-07-04 DIAGNOSIS — G8111 Spastic hemiplegia affecting right dominant side: Secondary | ICD-10-CM | POA: Diagnosis not present

## 2022-07-04 DIAGNOSIS — N179 Acute kidney failure, unspecified: Secondary | ICD-10-CM | POA: Diagnosis not present

## 2022-07-04 DIAGNOSIS — E785 Hyperlipidemia, unspecified: Secondary | ICD-10-CM | POA: Diagnosis not present

## 2022-07-05 DIAGNOSIS — G8111 Spastic hemiplegia affecting right dominant side: Secondary | ICD-10-CM | POA: Diagnosis not present

## 2022-07-05 DIAGNOSIS — I10 Essential (primary) hypertension: Secondary | ICD-10-CM | POA: Diagnosis not present

## 2022-07-05 DIAGNOSIS — R739 Hyperglycemia, unspecified: Secondary | ICD-10-CM | POA: Diagnosis not present

## 2022-07-05 DIAGNOSIS — E785 Hyperlipidemia, unspecified: Secondary | ICD-10-CM | POA: Diagnosis not present

## 2022-07-05 DIAGNOSIS — N179 Acute kidney failure, unspecified: Secondary | ICD-10-CM | POA: Diagnosis not present

## 2022-07-07 DIAGNOSIS — I10 Essential (primary) hypertension: Secondary | ICD-10-CM | POA: Diagnosis not present

## 2022-07-07 DIAGNOSIS — G8111 Spastic hemiplegia affecting right dominant side: Secondary | ICD-10-CM | POA: Diagnosis not present

## 2022-07-07 DIAGNOSIS — R739 Hyperglycemia, unspecified: Secondary | ICD-10-CM | POA: Diagnosis not present

## 2022-07-07 DIAGNOSIS — N179 Acute kidney failure, unspecified: Secondary | ICD-10-CM | POA: Diagnosis not present

## 2022-07-07 DIAGNOSIS — E785 Hyperlipidemia, unspecified: Secondary | ICD-10-CM | POA: Diagnosis not present

## 2022-07-11 DIAGNOSIS — N179 Acute kidney failure, unspecified: Secondary | ICD-10-CM | POA: Diagnosis not present

## 2022-07-11 DIAGNOSIS — E785 Hyperlipidemia, unspecified: Secondary | ICD-10-CM | POA: Diagnosis not present

## 2022-07-11 DIAGNOSIS — I10 Essential (primary) hypertension: Secondary | ICD-10-CM | POA: Diagnosis not present

## 2022-07-11 DIAGNOSIS — G8111 Spastic hemiplegia affecting right dominant side: Secondary | ICD-10-CM | POA: Diagnosis not present

## 2022-07-11 DIAGNOSIS — R739 Hyperglycemia, unspecified: Secondary | ICD-10-CM | POA: Diagnosis not present

## 2022-07-12 DIAGNOSIS — E785 Hyperlipidemia, unspecified: Secondary | ICD-10-CM | POA: Diagnosis not present

## 2022-07-12 DIAGNOSIS — G8111 Spastic hemiplegia affecting right dominant side: Secondary | ICD-10-CM | POA: Diagnosis not present

## 2022-07-12 DIAGNOSIS — N179 Acute kidney failure, unspecified: Secondary | ICD-10-CM | POA: Diagnosis not present

## 2022-07-12 DIAGNOSIS — R739 Hyperglycemia, unspecified: Secondary | ICD-10-CM | POA: Diagnosis not present

## 2022-07-12 DIAGNOSIS — I10 Essential (primary) hypertension: Secondary | ICD-10-CM | POA: Diagnosis not present

## 2022-07-15 DIAGNOSIS — I10 Essential (primary) hypertension: Secondary | ICD-10-CM | POA: Diagnosis not present

## 2022-07-15 DIAGNOSIS — R739 Hyperglycemia, unspecified: Secondary | ICD-10-CM | POA: Diagnosis not present

## 2022-07-15 DIAGNOSIS — E785 Hyperlipidemia, unspecified: Secondary | ICD-10-CM | POA: Diagnosis not present

## 2022-07-15 DIAGNOSIS — N179 Acute kidney failure, unspecified: Secondary | ICD-10-CM | POA: Diagnosis not present

## 2022-07-15 DIAGNOSIS — G8111 Spastic hemiplegia affecting right dominant side: Secondary | ICD-10-CM | POA: Diagnosis not present

## 2022-07-17 DIAGNOSIS — G8111 Spastic hemiplegia affecting right dominant side: Secondary | ICD-10-CM | POA: Diagnosis not present

## 2022-07-17 DIAGNOSIS — N179 Acute kidney failure, unspecified: Secondary | ICD-10-CM | POA: Diagnosis not present

## 2022-07-17 DIAGNOSIS — E785 Hyperlipidemia, unspecified: Secondary | ICD-10-CM | POA: Diagnosis not present

## 2022-07-17 DIAGNOSIS — I10 Essential (primary) hypertension: Secondary | ICD-10-CM | POA: Diagnosis not present

## 2022-07-17 DIAGNOSIS — R739 Hyperglycemia, unspecified: Secondary | ICD-10-CM | POA: Diagnosis not present

## 2022-07-19 DIAGNOSIS — E785 Hyperlipidemia, unspecified: Secondary | ICD-10-CM | POA: Diagnosis not present

## 2022-07-19 DIAGNOSIS — N179 Acute kidney failure, unspecified: Secondary | ICD-10-CM | POA: Diagnosis not present

## 2022-07-19 DIAGNOSIS — G8111 Spastic hemiplegia affecting right dominant side: Secondary | ICD-10-CM | POA: Diagnosis not present

## 2022-07-19 DIAGNOSIS — I10 Essential (primary) hypertension: Secondary | ICD-10-CM | POA: Diagnosis not present

## 2022-07-19 DIAGNOSIS — R739 Hyperglycemia, unspecified: Secondary | ICD-10-CM | POA: Diagnosis not present

## 2022-07-20 DIAGNOSIS — N179 Acute kidney failure, unspecified: Secondary | ICD-10-CM | POA: Diagnosis not present

## 2022-07-20 DIAGNOSIS — E785 Hyperlipidemia, unspecified: Secondary | ICD-10-CM | POA: Diagnosis not present

## 2022-07-20 DIAGNOSIS — I10 Essential (primary) hypertension: Secondary | ICD-10-CM | POA: Diagnosis not present

## 2022-07-20 DIAGNOSIS — G8111 Spastic hemiplegia affecting right dominant side: Secondary | ICD-10-CM | POA: Diagnosis not present

## 2022-07-20 DIAGNOSIS — R739 Hyperglycemia, unspecified: Secondary | ICD-10-CM | POA: Diagnosis not present

## 2022-07-21 DIAGNOSIS — G8111 Spastic hemiplegia affecting right dominant side: Secondary | ICD-10-CM | POA: Diagnosis not present

## 2022-07-21 DIAGNOSIS — E785 Hyperlipidemia, unspecified: Secondary | ICD-10-CM | POA: Diagnosis not present

## 2022-07-21 DIAGNOSIS — N179 Acute kidney failure, unspecified: Secondary | ICD-10-CM | POA: Diagnosis not present

## 2022-07-21 DIAGNOSIS — I10 Essential (primary) hypertension: Secondary | ICD-10-CM | POA: Diagnosis not present

## 2022-07-21 DIAGNOSIS — R739 Hyperglycemia, unspecified: Secondary | ICD-10-CM | POA: Diagnosis not present

## 2022-07-25 DIAGNOSIS — G8111 Spastic hemiplegia affecting right dominant side: Secondary | ICD-10-CM | POA: Diagnosis not present

## 2022-07-25 DIAGNOSIS — I10 Essential (primary) hypertension: Secondary | ICD-10-CM | POA: Diagnosis not present

## 2022-07-25 DIAGNOSIS — R739 Hyperglycemia, unspecified: Secondary | ICD-10-CM | POA: Diagnosis not present

## 2022-07-25 DIAGNOSIS — N179 Acute kidney failure, unspecified: Secondary | ICD-10-CM | POA: Diagnosis not present

## 2022-07-25 DIAGNOSIS — E785 Hyperlipidemia, unspecified: Secondary | ICD-10-CM | POA: Diagnosis not present

## 2022-07-27 DIAGNOSIS — I10 Essential (primary) hypertension: Secondary | ICD-10-CM | POA: Diagnosis not present

## 2022-07-27 DIAGNOSIS — G8111 Spastic hemiplegia affecting right dominant side: Secondary | ICD-10-CM | POA: Diagnosis not present

## 2022-07-27 DIAGNOSIS — N179 Acute kidney failure, unspecified: Secondary | ICD-10-CM | POA: Diagnosis not present

## 2022-07-27 DIAGNOSIS — E785 Hyperlipidemia, unspecified: Secondary | ICD-10-CM | POA: Diagnosis not present

## 2022-07-27 DIAGNOSIS — R739 Hyperglycemia, unspecified: Secondary | ICD-10-CM | POA: Diagnosis not present

## 2022-08-04 DIAGNOSIS — G8111 Spastic hemiplegia affecting right dominant side: Secondary | ICD-10-CM | POA: Diagnosis not present

## 2022-08-04 DIAGNOSIS — I10 Essential (primary) hypertension: Secondary | ICD-10-CM | POA: Diagnosis not present

## 2022-08-04 DIAGNOSIS — E785 Hyperlipidemia, unspecified: Secondary | ICD-10-CM | POA: Diagnosis not present

## 2022-08-04 DIAGNOSIS — R739 Hyperglycemia, unspecified: Secondary | ICD-10-CM | POA: Diagnosis not present

## 2022-08-04 DIAGNOSIS — N179 Acute kidney failure, unspecified: Secondary | ICD-10-CM | POA: Diagnosis not present

## 2022-08-11 DIAGNOSIS — E785 Hyperlipidemia, unspecified: Secondary | ICD-10-CM | POA: Diagnosis not present

## 2022-08-11 DIAGNOSIS — I10 Essential (primary) hypertension: Secondary | ICD-10-CM | POA: Diagnosis not present

## 2022-08-11 DIAGNOSIS — G8111 Spastic hemiplegia affecting right dominant side: Secondary | ICD-10-CM | POA: Diagnosis not present

## 2022-08-11 DIAGNOSIS — R739 Hyperglycemia, unspecified: Secondary | ICD-10-CM | POA: Diagnosis not present

## 2022-08-11 DIAGNOSIS — N179 Acute kidney failure, unspecified: Secondary | ICD-10-CM | POA: Diagnosis not present

## 2022-08-16 DIAGNOSIS — E785 Hyperlipidemia, unspecified: Secondary | ICD-10-CM | POA: Diagnosis not present

## 2022-08-16 DIAGNOSIS — I10 Essential (primary) hypertension: Secondary | ICD-10-CM | POA: Diagnosis not present

## 2022-08-16 DIAGNOSIS — G8111 Spastic hemiplegia affecting right dominant side: Secondary | ICD-10-CM | POA: Diagnosis not present

## 2022-08-18 DIAGNOSIS — I10 Essential (primary) hypertension: Secondary | ICD-10-CM | POA: Diagnosis not present

## 2022-08-18 DIAGNOSIS — E785 Hyperlipidemia, unspecified: Secondary | ICD-10-CM | POA: Diagnosis not present

## 2022-08-18 DIAGNOSIS — G8111 Spastic hemiplegia affecting right dominant side: Secondary | ICD-10-CM | POA: Diagnosis not present

## 2022-08-20 ENCOUNTER — Inpatient Hospital Stay (HOSPITAL_COMMUNITY)
Admission: EM | Admit: 2022-08-20 | Discharge: 2022-08-30 | DRG: 178 | Disposition: A | Discharge status: Skilled Nursing Facility | Payer: No Typology Code available for payment source | Attending: Student | Admitting: Student

## 2022-08-20 ENCOUNTER — Emergency Department (HOSPITAL_COMMUNITY): Payer: No Typology Code available for payment source

## 2022-08-20 ENCOUNTER — Other Ambulatory Visit: Payer: Self-pay

## 2022-08-20 DIAGNOSIS — I959 Hypotension, unspecified: Secondary | ICD-10-CM | POA: Diagnosis not present

## 2022-08-20 DIAGNOSIS — E785 Hyperlipidemia, unspecified: Secondary | ICD-10-CM | POA: Diagnosis present

## 2022-08-20 DIAGNOSIS — Z8249 Family history of ischemic heart disease and other diseases of the circulatory system: Secondary | ICD-10-CM

## 2022-08-20 DIAGNOSIS — U071 COVID-19: Secondary | ICD-10-CM | POA: Diagnosis not present

## 2022-08-20 DIAGNOSIS — R531 Weakness: Secondary | ICD-10-CM

## 2022-08-20 DIAGNOSIS — G9340 Encephalopathy, unspecified: Secondary | ICD-10-CM

## 2022-08-20 DIAGNOSIS — F03C3 Unspecified dementia, severe, with mood disturbance: Secondary | ICD-10-CM | POA: Diagnosis present

## 2022-08-20 DIAGNOSIS — I447 Left bundle-branch block, unspecified: Secondary | ICD-10-CM | POA: Diagnosis not present

## 2022-08-20 DIAGNOSIS — D72819 Decreased white blood cell count, unspecified: Secondary | ICD-10-CM | POA: Diagnosis present

## 2022-08-20 DIAGNOSIS — R4182 Altered mental status, unspecified: Secondary | ICD-10-CM | POA: Diagnosis not present

## 2022-08-20 DIAGNOSIS — J449 Chronic obstructive pulmonary disease, unspecified: Secondary | ICD-10-CM | POA: Diagnosis present

## 2022-08-20 DIAGNOSIS — L8990 Pressure ulcer of unspecified site, unspecified stage: Secondary | ICD-10-CM | POA: Insufficient documentation

## 2022-08-20 DIAGNOSIS — R197 Diarrhea, unspecified: Secondary | ICD-10-CM | POA: Diagnosis not present

## 2022-08-20 DIAGNOSIS — D696 Thrombocytopenia, unspecified: Secondary | ICD-10-CM | POA: Diagnosis present

## 2022-08-20 DIAGNOSIS — M1990 Unspecified osteoarthritis, unspecified site: Secondary | ICD-10-CM | POA: Diagnosis present

## 2022-08-20 DIAGNOSIS — Z515 Encounter for palliative care: Secondary | ICD-10-CM

## 2022-08-20 DIAGNOSIS — Z87891 Personal history of nicotine dependence: Secondary | ICD-10-CM

## 2022-08-20 DIAGNOSIS — I2510 Atherosclerotic heart disease of native coronary artery without angina pectoris: Secondary | ICD-10-CM | POA: Diagnosis present

## 2022-08-20 DIAGNOSIS — Z951 Presence of aortocoronary bypass graft: Secondary | ICD-10-CM

## 2022-08-20 DIAGNOSIS — Z751 Person awaiting admission to adequate facility elsewhere: Secondary | ICD-10-CM

## 2022-08-20 DIAGNOSIS — E871 Hypo-osmolality and hyponatremia: Secondary | ICD-10-CM | POA: Diagnosis present

## 2022-08-20 DIAGNOSIS — F32A Depression, unspecified: Secondary | ICD-10-CM | POA: Diagnosis present

## 2022-08-20 DIAGNOSIS — Z882 Allergy status to sulfonamides status: Secondary | ICD-10-CM

## 2022-08-20 DIAGNOSIS — G9349 Other encephalopathy: Secondary | ICD-10-CM | POA: Diagnosis present

## 2022-08-20 DIAGNOSIS — Z7401 Bed confinement status: Secondary | ICD-10-CM

## 2022-08-20 DIAGNOSIS — Z66 Do not resuscitate: Secondary | ICD-10-CM | POA: Diagnosis present

## 2022-08-20 DIAGNOSIS — Z953 Presence of xenogenic heart valve: Secondary | ICD-10-CM

## 2022-08-20 DIAGNOSIS — R410 Disorientation, unspecified: Secondary | ICD-10-CM | POA: Diagnosis not present

## 2022-08-20 DIAGNOSIS — Z7982 Long term (current) use of aspirin: Secondary | ICD-10-CM

## 2022-08-20 DIAGNOSIS — I69351 Hemiplegia and hemiparesis following cerebral infarction affecting right dominant side: Secondary | ICD-10-CM

## 2022-08-20 DIAGNOSIS — M545 Low back pain, unspecified: Secondary | ICD-10-CM | POA: Diagnosis present

## 2022-08-20 DIAGNOSIS — J90 Pleural effusion, not elsewhere classified: Secondary | ICD-10-CM | POA: Diagnosis present

## 2022-08-20 DIAGNOSIS — Z888 Allergy status to other drugs, medicaments and biological substances status: Secondary | ICD-10-CM

## 2022-08-20 DIAGNOSIS — Z85820 Personal history of malignant melanoma of skin: Secondary | ICD-10-CM

## 2022-08-20 DIAGNOSIS — E8809 Other disorders of plasma-protein metabolism, not elsewhere classified: Secondary | ICD-10-CM | POA: Diagnosis present

## 2022-08-20 DIAGNOSIS — I69322 Dysarthria following cerebral infarction: Secondary | ICD-10-CM

## 2022-08-20 DIAGNOSIS — M21371 Foot drop, right foot: Secondary | ICD-10-CM | POA: Diagnosis present

## 2022-08-20 DIAGNOSIS — Z79899 Other long term (current) drug therapy: Secondary | ICD-10-CM

## 2022-08-20 DIAGNOSIS — Z7901 Long term (current) use of anticoagulants: Secondary | ICD-10-CM

## 2022-08-20 DIAGNOSIS — J9811 Atelectasis: Secondary | ICD-10-CM | POA: Diagnosis present

## 2022-08-20 DIAGNOSIS — Z952 Presence of prosthetic heart valve: Secondary | ICD-10-CM

## 2022-08-20 DIAGNOSIS — I69922 Dysarthria following unspecified cerebrovascular disease: Secondary | ICD-10-CM

## 2022-08-20 DIAGNOSIS — K219 Gastro-esophageal reflux disease without esophagitis: Secondary | ICD-10-CM | POA: Diagnosis present

## 2022-08-20 DIAGNOSIS — I10 Essential (primary) hypertension: Secondary | ICD-10-CM | POA: Diagnosis present

## 2022-08-20 DIAGNOSIS — F321 Major depressive disorder, single episode, moderate: Secondary | ICD-10-CM | POA: Diagnosis present

## 2022-08-20 DIAGNOSIS — Z7189 Other specified counseling: Secondary | ICD-10-CM

## 2022-08-20 HISTORY — DX: Encephalopathy, unspecified: G93.40

## 2022-08-20 LAB — URINALYSIS, ROUTINE W REFLEX MICROSCOPIC
Bilirubin Urine: NEGATIVE
Glucose, UA: NEGATIVE mg/dL
Hgb urine dipstick: NEGATIVE
Ketones, ur: NEGATIVE mg/dL
Leukocytes,Ua: NEGATIVE
Nitrite: NEGATIVE
Protein, ur: NEGATIVE mg/dL
Specific Gravity, Urine: 1.023 (ref 1.005–1.030)
pH: 5 (ref 5.0–8.0)

## 2022-08-20 LAB — VITAMIN B12: Vitamin B-12: 89 pg/mL — ABNORMAL LOW (ref 180–914)

## 2022-08-20 LAB — CBC WITH DIFFERENTIAL/PLATELET
Abs Immature Granulocytes: 0.04 10*3/uL (ref 0.00–0.07)
Basophils Absolute: 0 10*3/uL (ref 0.0–0.1)
Basophils Relative: 1 %
Eosinophils Absolute: 0 10*3/uL (ref 0.0–0.5)
Eosinophils Relative: 0 %
HCT: 41.2 % (ref 39.0–52.0)
Hemoglobin: 14 g/dL (ref 13.0–17.0)
Immature Granulocytes: 1 %
Lymphocytes Relative: 7 %
Lymphs Abs: 0.4 10*3/uL — ABNORMAL LOW (ref 0.7–4.0)
MCH: 31.5 pg (ref 26.0–34.0)
MCHC: 34 g/dL (ref 30.0–36.0)
MCV: 92.8 fL (ref 80.0–100.0)
Monocytes Absolute: 0.9 10*3/uL (ref 0.1–1.0)
Monocytes Relative: 15 %
Neutro Abs: 4.5 10*3/uL (ref 1.7–7.7)
Neutrophils Relative %: 76 %
Platelets: 110 10*3/uL — ABNORMAL LOW (ref 150–400)
RBC: 4.44 MIL/uL (ref 4.22–5.81)
RDW: 13.3 % (ref 11.5–15.5)
WBC: 5.9 10*3/uL (ref 4.0–10.5)
nRBC: 0 % (ref 0.0–0.2)

## 2022-08-20 LAB — RESP PANEL BY RT-PCR (RSV, FLU A&B, COVID)  RVPGX2
Influenza A by PCR: NEGATIVE
Influenza B by PCR: NEGATIVE
Resp Syncytial Virus by PCR: NEGATIVE
SARS Coronavirus 2 by RT PCR: POSITIVE — AB

## 2022-08-20 LAB — I-STAT VENOUS BLOOD GAS, ED
Acid-Base Excess: 2 mmol/L (ref 0.0–2.0)
Bicarbonate: 26 mmol/L (ref 20.0–28.0)
Calcium, Ion: 1.08 mmol/L — ABNORMAL LOW (ref 1.15–1.40)
HCT: 41 % (ref 39.0–52.0)
Hemoglobin: 13.9 g/dL (ref 13.0–17.0)
O2 Saturation: 96 %
Potassium: 4.3 mmol/L (ref 3.5–5.1)
Sodium: 135 mmol/L (ref 135–145)
TCO2: 27 mmol/L (ref 22–32)
pCO2, Ven: 39.2 mmHg — ABNORMAL LOW (ref 44–60)
pH, Ven: 7.429 (ref 7.25–7.43)
pO2, Ven: 81 mmHg — ABNORMAL HIGH (ref 32–45)

## 2022-08-20 LAB — COMPREHENSIVE METABOLIC PANEL
ALT: 10 U/L (ref 0–44)
AST: 18 U/L (ref 15–41)
Albumin: 2.9 g/dL — ABNORMAL LOW (ref 3.5–5.0)
Alkaline Phosphatase: 54 U/L (ref 38–126)
Anion gap: 10 (ref 5–15)
BUN: 14 mg/dL (ref 8–23)
CO2: 22 mmol/L (ref 22–32)
Calcium: 8.3 mg/dL — ABNORMAL LOW (ref 8.9–10.3)
Chloride: 101 mmol/L (ref 98–111)
Creatinine, Ser: 1.23 mg/dL (ref 0.61–1.24)
GFR, Estimated: 53 mL/min — ABNORMAL LOW (ref >60)
Glucose, Bld: 123 mg/dL — ABNORMAL HIGH (ref 70–99)
Potassium: 4.1 mmol/L (ref 3.5–5.1)
Sodium: 133 mmol/L — ABNORMAL LOW (ref 135–145)
Total Bilirubin: 0.6 mg/dL (ref 0.3–1.2)
Total Protein: 6.2 g/dL — ABNORMAL LOW (ref 6.5–8.1)

## 2022-08-20 LAB — CBG MONITORING, ED: Glucose-Capillary: 147 mg/dL — ABNORMAL HIGH (ref 70–99)

## 2022-08-20 LAB — AMMONIA: Ammonia: 32 umol/L (ref 9–35)

## 2022-08-20 LAB — LACTIC ACID, PLASMA: Lactic Acid, Venous: 1.3 mmol/L (ref 0.5–1.9)

## 2022-08-20 LAB — TSH: TSH: 0.937 u[IU]/mL (ref 0.350–4.500)

## 2022-08-20 MED ORDER — METOPROLOL TARTRATE 5 MG/5ML IV SOLN: 5.0000 mg | Freq: Four times a day (QID) | INTRAVENOUS | Status: DC | PRN

## 2022-08-20 MED ORDER — SODIUM CHLORIDE 0.9 % IV SOLN: INTRAVENOUS | Status: AC

## 2022-08-20 MED ORDER — ORAL CARE MOUTH RINSE
15.0000 mL | OROMUCOSAL | Status: DC
  Administered 2022-08-20 – 2022-08-27 (×26): 15 mL via OROMUCOSAL

## 2022-08-20 MED ORDER — SODIUM CHLORIDE 0.9 % IV BOLUS
1000.0000 mL | Freq: Once | INTRAVENOUS | Status: AC
  Administered 2022-08-20: 1000 mL via INTRAVENOUS

## 2022-08-20 MED ORDER — ORAL CARE MOUTH RINSE: 15.0000 mL | OROMUCOSAL | Status: DC | PRN

## 2022-08-20 MED ORDER — PANTOPRAZOLE SODIUM 40 MG IV SOLR
40.0000 mg | INTRAVENOUS | Status: DC
  Administered 2022-08-20 – 2022-08-21 (×2): 40 mg via INTRAVENOUS
  Filled 2022-08-20 (×2): qty 10

## 2022-08-20 MED ORDER — ACETAMINOPHEN 650 MG RE SUPP: 650.0000 mg | Freq: Four times a day (QID) | RECTAL | Status: DC | PRN

## 2022-08-20 MED ORDER — ACETAMINOPHEN 325 MG PO TABS
650.0000 mg | ORAL_TABLET | Freq: Four times a day (QID) | ORAL | Status: DC | PRN
  Administered 2022-08-24 – 2022-08-25 (×2): 650 mg via ORAL
  Filled 2022-08-20 (×2): qty 2

## 2022-08-20 NOTE — Assessment & Plan Note (Signed)
At baseline PT/OT

## 2022-08-20 NOTE — ED Triage Notes (Signed)
[facility] reports pt coming from home, lives with his daughter. She states yesterday at 0900 was his last seen normal. Previous stroke with right side deficits and slurred speech. Yesterday they noticed his speech getting worse and his confusion. EMS states his speech has gotten better, confused with EMS.

## 2022-08-20 NOTE — Assessment & Plan Note (Signed)
Likely secondary to covid infection  PT/OT/SLP If does not return to baseline will need rehab/SNF Check TSH/B12  [facility], time limited IVF Fall precautions

## 2022-08-20 NOTE — Assessment & Plan Note (Addendum)
BP soft, hold atenolol for now  Allow for permissive HTN until acute CVA ruled out IV parameters placed PRN

## 2022-08-20 NOTE — Plan of Care (Signed)
  Problem: Education: Goal: Knowledge of disease or condition will improve Outcome: Progressing   Problem: Coping: Goal: Will identify appropriate support needs Outcome: Progressing   Problem: Respiratory: Goal: Will maintain a patent airway Outcome: Progressing Goal: Complications related to the disease process, condition or treatment will be avoided or minimized Outcome: Progressing   Problem: Clinical Measurements: Goal: Respiratory complications will improve Outcome: Progressing Goal: Cardiovascular complication will be avoided Outcome: Progressing

## 2022-08-20 NOTE — ED Notes (Signed)
ED TO INPATIENT HANDOFF REPORT  ED Nurse Name and Phone #: Chloe  S Name/Age/Gender David [name] 89 y.o. male Room/Bed: 023C/023C  Code Status   Code Status: Prior  Home/SNF/Other Home Patient oriented to: self Is this baseline? No   Triage Complete: Triage complete  Chief Complaint Acute encephalopathy [G93.40]  Triage Note [facility] reports pt coming from home, lives with his daughter. She states yesterday at 0900 was his last seen normal. Previous stroke with right side deficits and slurred speech. Yesterday they noticed his speech getting worse and his confusion. EMS states his speech has gotten better, confused with EMS.    Allergies Allergies  Allergen Reactions   Atorvastatin Other (See Comments)    Unknown reaction   Colesevelam Other (See Comments)    Unknown reaction   Simvastatin Other (See Comments)    Unknown reaction   Sulfa Antibiotics Other (See Comments)    Unknown reaction    Level of Care/Admitting Diagnosis ED Disposition   ED Disposition: Admit Condition: None Comment: Hospital Area: [facility] [[id number]]  Level of Care: Telemetry Medical [104]  May place patient in observation at [facility] or [facility] if equivalent level of care is available:: No  Covid Evaluation: Confirmed COVID Positive  Diagnosis: Acute encephalopathy [017793]  Admitting Physician: Orland Mustard [[id number]]  Attending Physician: Orland Mustard [[id number]]      B Medical/Surgery History Past Medical History:  Diagnosis Date   CAD (coronary artery disease)    intolerance of statins   COPD (chronic obstructive pulmonary disease) (HCC)    Current use of long term anticoagulation    GERD (gastroesophageal reflux disease)    Hyperlipidemia    Hypertension    Impaired glucose tolerance 02/14/2011   LBP (low back pain)    OA (osteoarthritis)    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   Past Surgical History:  Procedure Laterality  Date   AORTIC VALVE REPLACEMENT  01/15/1999   bovine   CARDIAC CATHETERIZATION  10/05/1998   Recommend CABG revascularizaition   CARDIAC CATHETERIZATION  06/11/1999   Continue medical therapy   CARDIAC CATHETERIZATION  01/08/2007   Patent grafts, recommend medical therapy   CARDIOVASCULAR STRESS TEST  04/22/2010   Small lateral defect which is partially reversible, no ischemic EKG changes were noted.   CAROTID DOPPLER  04/22/2010   Bilateral ICAs-no evidence of diametere reduction, significant tortuosity, or any other vascular abnormality.   CORONARY ARTERY BYPASS GRAFT  10/09/1998   LIMA to LAD, SVG to diag, SVG sequential to 2 marginals, SVG sequential to PDA & PLA. CE#25 porcine AVR.   TRANSTHORACIC ECHOCARDIOGRAM  05/14/2012   EF 50-55%, bioprosthesis of the aortic valve with well preserved LV function, moderate concentric LVGH     A IV Location/Drains/Wounds Patient Lines/Drains/Airways Status     Active Line/Drains/Airways     Name Placement date Placement time Site Days   Peripheral IV 08/20/22 20 G 1" Anterior;Distal;Left;Upper Arm 08/20/22  1150  Arm  less than 1            Intake/Output Last 24 hours No intake or output data in the 24 hours ending 08/20/22 1530  Labs/Imaging Results for orders placed or performed during the hospital encounter of 08/20/22 (from the past 48 hour(s))  Ammonia     Status: None   Collection Time: 08/20/22 12:22 PM  Result Value Ref Range   Ammonia 32 9 - 35 umol/L    Comment: HEMOLYSIS AT  THIS LEVEL MAY AFFECT RESULT Performed at [facility], [address], [place], [place] [address]   Comprehensive metabolic panel     Status: Abnormal   Collection Time: 08/20/22 12:22 PM  Result Value Ref Range   Sodium 133 (L) 135 - 145 mmol/L   Potassium 4.1 3.5 - 5.1 mmol/L   Chloride 101 98 - 111 mmol/L   CO2 22 22 - 32 mmol/L   Glucose, Bld 123 (H) 70 - 99 mg/dL    Comment: Glucose reference range applies only to samples taken after  fasting for at least 8 hours.   BUN 14 8 - 23 mg/dL   Creatinine, Ser 6.25 0.61 - 1.24 mg/dL   Calcium 8.3 (L) 8.9 - 10.3 mg/dL   Total Protein 6.2 (L) 6.5 - 8.1 g/dL   Albumin 2.9 (L) 3.5 - 5.0 g/dL   AST 18 15 - 41 U/L   ALT 10 0 - 44 U/L   Alkaline Phosphatase 54 38 - 126 U/L   Total Bilirubin 0.6 0.3 - 1.2 mg/dL   GFR, Estimated 53 (L) >60 mL/min    Comment: (NOTE) Calculated using the CKD-EPI Creatinine Equation (2021)    Anion gap 10 5 - 15    Comment: Performed at [facility], [address], [place], [place] [address]  Lactic acid, plasma     Status: None   Collection Time: 08/20/22 12:22 PM  Result Value Ref Range   Lactic Acid, Venous 1.3 0.5 - 1.9 mmol/L    Comment: Performed at [facility], [address], [place], [place] [address]  I-Stat venous blood gas, ED     Status: Abnormal   Collection Time: 08/20/22 12:34 PM  Result Value Ref Range   pH, Ven 7.429 7.25 - 7.43   pCO2, Ven 39.2 (L) 44 - 60 mmHg   pO2, Ven 81 (H) 32 - 45 mmHg   Bicarbonate 26.0 20.0 - 28.0 mmol/L   TCO2 27 22 - 32 mmol/L   O2 Saturation 96 %   Acid-Base Excess 2.0 0.0 - 2.0 mmol/L   Sodium 135 135 - 145 mmol/L   Potassium 4.3 3.5 - 5.1 mmol/L   Calcium, Ion 1.08 (L) 1.15 - 1.40 mmol/L   HCT 41.0 39.0 - 52.0 %   Hemoglobin 13.9 13.0 - 17.0 g/dL   Sample type VENOUS   Resp panel by RT-PCR (RSV, Flu A&B, Covid) Anterior Nasal Swab     Status: Abnormal   Collection Time: 08/20/22  1:24 PM   Specimen: Anterior Nasal Swab  Result Value Ref Range   SARS Coronavirus 2 by RT PCR POSITIVE (A) NEGATIVE   Influenza A by PCR NEGATIVE NEGATIVE   Influenza B by PCR NEGATIVE NEGATIVE    Comment: (NOTE) The Xpert Xpress SARS-CoV-2/FLU/RSV plus assay is intended as an aid in the diagnosis of influenza from Nasopharyngeal swab specimens and should not be used as a sole basis for treatment. Nasal washings and aspirates are unacceptable for Xpert Xpress SARS-CoV-2/FLU/RSV testing.  Fact  Sheet for Patients: BloggerCourse.com  Fact Sheet for Healthcare Providers: SeriousBroker.it  This test is not yet approved or cleared by the [place] FDA and has been authorized for detection and/or diagnosis of SARS-CoV-2 by FDA under an Emergency Use Authorization (EUA). This EUA will remain in effect (meaning this test can be used) for the duration of the COVID-19 declaration under Section 564(b)(1) of the Act, 21 U.S.C. section 360bbb-3(b)(1), unless the authorization is terminated  or revoked.     Resp Syncytial Virus by PCR NEGATIVE NEGATIVE    Comment: (NOTE) Fact Sheet for Patients: BloggerCourse.comhttps://www.fda.gov/media/152166/download  Fact Sheet for Healthcare Providers: SeriousBroker.ithttps://www.fda.gov/media/152162/download  This test is not yet approved or cleared by the [place] FDA and has been authorized for detection and/or diagnosis of SARS-CoV-2 by FDA under an Emergency Use Authorization (EUA). This EUA will remain in effect (meaning this test can be used) for the duration of the COVID-19 declaration under Section 564(b)(1) of the Act, 21 U.S.C. section 360bbb-3(b)(1), unless the authorization is terminated or revoked.  Performed at [facility], [address] [place], [place] [address]   CBC with Differential/Platelet     Status: Abnormal   Collection Time: 08/20/22  1:28 PM  Result Value Ref Range   WBC 5.9 4.0 - 10.5 K/uL   RBC 4.44 4.22 - 5.81 MIL/uL   Hemoglobin 14.0 13.0 - 17.0 g/dL   HCT 00.841.2 67.639.0 - 19.552.0 %   MCV 92.8 80.0 - 100.0 fL   MCH 31.5 26.0 - 34.0 pg   MCHC 34.0 30.0 - 36.0 g/dL   RDW 09.313.3 26.711.5 - 12.415.5 %   Platelets 110 (L) 150 - 400 K/uL    Comment: REPEATED TO VERIFY   nRBC 0.0 0.0 - 0.2 %   Neutrophils Relative % 76 %   Neutro Abs 4.5 1.7 - 7.7 K/uL   Lymphocytes Relative 7 %   Lymphs Abs 0.4 (L) 0.7 - 4.0 K/uL   Monocytes Relative 15 %   Monocytes Absolute 0.9 0.1 - 1.0 K/uL    Eosinophils Relative 0 %   Eosinophils Absolute 0.0 0.0 - 0.5 K/uL   Basophils Relative 1 %   Basophils Absolute 0.0 0.0 - 0.1 K/uL   Immature Granulocytes 1 %   Abs Immature Granulocytes 0.04 0.00 - 0.07 K/uL    Comment: Performed at [facility], [address] [place], [place] [address]  CBG monitoring, ED     Status: Abnormal   Collection Time: 08/20/22  1:33 PM  Result Value Ref Range   Glucose-Capillary 147 (H) 70 - 99 mg/dL    Comment: Glucose reference range applies only to samples taken after fasting for at least 8 hours.  Urinalysis, Routine w reflex microscopic -Urine, Clean Catch     Status: None   Collection Time: 08/20/22  2:35 PM  Result Value Ref Range   Color, Urine YELLOW YELLOW   APPearance CLEAR CLEAR   Specific Gravity, Urine 1.023 1.005 - 1.030   pH 5.0 5.0 - 8.0   Glucose, UA NEGATIVE NEGATIVE mg/dL   Hgb urine dipstick NEGATIVE NEGATIVE   Bilirubin Urine NEGATIVE NEGATIVE   Ketones, ur NEGATIVE NEGATIVE mg/dL   Protein, ur NEGATIVE NEGATIVE mg/dL   Nitrite NEGATIVE NEGATIVE   Leukocytes,Ua NEGATIVE NEGATIVE    Comment: Performed at [facility], [address] [place], [place] [address]   CT HEAD WO CONTRAST  Result Date: 08/20/2022 CLINICAL DATA:  Mental status change, persistent or worsening. EXAM: CT HEAD WITHOUT CONTRAST TECHNIQUE: Contiguous axial images were obtained from the base of the skull through the vertex without intravenous contrast. RADIATION DOSE REDUCTION: This exam was performed according to the departmental dose-optimization program which includes automated exposure control, adjustment of the mA and/or kV according to patient size and/or use of iterative reconstruction technique. COMPARISON:  Prior head CT examinations 09/23/2019 FINDINGS: Brain: Moderate generalized cerebral atrophy. Cavity centered within the posterior limb of left internal  capsule at site of prior parenchymal hemorrhage. Chronic small-vessel infarcts within  bilateral basal ganglia. Lacunar infarct within the left thalamus, new from the prior head CT of 09/23/2019 but otherwise age indeterminate. Background moderate patchy and ill-defined hypoattenuation within the cerebral white matter, nonspecific but compatible with chronic small vessel ischemic disease. There is no acute intracranial hemorrhage. No extra-axial fluid collection. No evidence of an intracranial mass. No midline shift. Vascular: No hyperdense vessel.  Atherosclerotic calcifications. Skull: No fracture or aggressive osseous lesion. Sinuses/Orbits: No mass or acute finding within the imaged orbits. No significant paranasal sinus disease at the imaged levels. IMPRESSION: 1. Lacunar infarct within the left thalamus, new from the prior head CT of 09/23/2019 but otherwise age indeterminate. Consider a brain MRI for further evaluation. 2. Cavity centered within the posterior limb of left internal capsule at site of prior parenchymal hemorrhage. 3. Background parenchymal atrophy, chronic small vessel ischemic disease and chronic small-vessel infarcts as described. Electronically Signed   By: Jackey Loge D.O.   On: 08/20/2022 13:17   DG Chest Port 1 View  Result Date: 08/20/2022 CLINICAL DATA:  Altered mental status EXAM: PORTABLE CHEST 1 VIEW COMPARISON:  09/28/2020 FINDINGS: Stable heart size status post sternotomy and CABG. Prior cardiac valve replacement. Aortic atherosclerosis. Low lung volumes. Small layering left-sided pleural effusion. Mild streaky left basilar opacity. No pneumothorax. IMPRESSION: Small layering left-sided pleural effusion with mild streaky left basilar opacity, likely atelectasis. Electronically Signed   By: Duanne Guess D.O.   On: 08/20/2022 12:49    Pending Labs Unresulted Labs (From admission, onward)     Start     Ordered   08/20/22 1219  CBC with Differential/Platelet  Once,   STAT        08/20/22 1218   08/20/22 1219  Blood culture (routine x 2)  BLOOD CULTURE X  2,   R (with STAT occurrences)      08/20/22 1218            Vitals/Pain Today's Vitals   08/20/22 1412 08/20/22 1430 08/20/22 1432 08/20/22 1500  BP:  (Abnormal) 101/48  (Abnormal) 104/52  Pulse: 75 71 71 69  Resp: 18 (Abnormal) 22 (Abnormal) 23 (Abnormal) 22  Temp:      TempSrc:      SpO2: 98% 99% 99% 99%    Isolation Precautions No active isolations  Medications Medications  sodium chloride 0.9 % bolus 1,000 mL (0 mLs Intravenous Stopped 08/20/22 1432)    Mobility non-ambulatory     Focused Assessments Neuro Assessment Handoff:  Swallow screen pass?  N/A         Neuro Assessment:   Neuro Checks:      Has TPA been given? No If patient is a Neuro Trauma and patient is going to OR before floor call report to 4N Charge nurse: [phone] or [phone]   R Recommendations: See Admitting Provider Note  Report given to:   Additional Notes:

## 2022-08-20 NOTE — Assessment & Plan Note (Signed)
S/p CABG Stable Continue  medical management with zetia, ASA and beta blocker Statin intolerant

## 2022-08-20 NOTE — Progress Notes (Signed)
Patient received from ED at 4;45, alert and oriented to himself, daughter in room, vital signs stable, call bell with in reach

## 2022-08-20 NOTE — Assessment & Plan Note (Signed)
Chronic thrombocytopenia dating back to 2012 Slightly worse in setting of viral infection Trend

## 2022-08-20 NOTE — Assessment & Plan Note (Signed)
No s/sx of exacerbation No rescue inhaler or maintenance inhaler

## 2022-08-20 NOTE — Assessment & Plan Note (Signed)
Continue lexapro  ?

## 2022-08-20 NOTE — Assessment & Plan Note (Signed)
He has had no respiratory symptoms or fever at home and only symptoms has been lethargy Discussed if less than 5 days of symptoms we could do paxolovid, but since we can't verify exact onset of symptoms will continue supportive care a this time -PPE/airborne and contact precautions -no respiratory complains/symptoms -supportive care -gentle IVF -PT/OT

## 2022-08-20 NOTE — H&P (Signed)
History and Physical    Patient: David [name] IYM:[id number] DOB: 02-Jul-1923 DOA: 08/20/2022 DOS: the patient was seen and examined on 08/20/2022 PCP: Plotnikov, Georgina Quint, MD  Patient coming from: Home - lives with his wife. Bed bound, but can transfer to chair, lift chair.    Chief Complaint: speech difficulty, lethargy/weakness  HPI: David [name] is a 89 y.o. 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  His wife states that she normally understands what he is saying, but last night she couldn't understand what he was saying and he was just mumbling. He wanted something, but they couldn't figure it out.  He didn't eat much at all for dinner and he wasn't helping at all with moving him. This morning he was still not helping and ate about 1/2 a waffle. He wouldn't brush his teeth and this is very unusual for him. He was very lethargic and couldn't keep his eyes open. He also had one episode of diarrhea last night. He also didn't want anyone to touch I'm which is not normal.     Denies any fever/chills, vision changes/headaches, chest pain or palpitations, shortness of breath or cough, abdominal pain, N/V, dysuria or leg swelling.    No smoking or drinking.    ER Course:  vitals: afebrile, bP 122/62, HR: 68, RR: 26, oxygen: 96% RA Pertinent labs: platelets 110, COVID positive,  CXR: small layering left sided pleural effusion with mild streaky left basilar opacity, likely atelectasis CT head: Lacunar infarct within the left thalamus, new from the prior head CT of 09/23/2019 but otherwise age indeterminate. Consider a brain MRI for further evaluation. 2. Cavity centered within the posterior limb of left internal capsule at site of prior parenchymal hemorrhage. 3. Background parenchymal atrophy, chronic small  vessel ischemic disease and chronic small-vessel infarcts as described. In ED: neurology consulted, given 1L IVF bolus and BC obtained TR[facility] asked to admit.    Review of Systems: unable to review all systems due to the inability of the patient to answer questions. Past Medical History:  Diagnosis Date   CAD (coronary artery disease)    intolerance of statins   COPD (chronic obstructive pulmonary disease) (HCC)    Current use of long term anticoagulation    GERD (gastroesophageal reflux disease)    Hyperlipidemia    Hypertension    Impaired glucose tolerance 02/14/2011   LBP (low back pain)    OA (osteoarthritis)    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   Past Surgical History:  Procedure Laterality Date   AORTIC VALVE REPLACEMENT  01/15/1999   bovine   CARDIAC CATHETERIZATION  10/05/1998   Recommend CABG revascularizaition   CARDIAC CATHETERIZATION  06/11/1999   Continue medical therapy   CARDIAC CATHETERIZATION  01/08/2007   Patent grafts, recommend medical therapy   CARDIOVASCULAR STRESS TEST  04/22/2010   Small lateral defect which is partially reversible, no ischemic EKG changes were noted.   CAROTID DOPPLER  04/22/2010   Bilateral ICAs-no evidence of diametere reduction, significant tortuosity, or any other vascular abnormality.   CORONARY ARTERY BYPASS GRAFT  10/09/1998   LIMA to LAD, SVG to diag, SVG sequential to 2 marginals, SVG sequential to PDA & PLA. CE#25 porcine AVR.   TRANSTHORACIC ECHOCARDIOGRAM  05/14/2012   EF 50-55%, bioprosthesis of  the aortic valve with well preserved LV function, moderate concentric LVGH   Social History:  reports that he quit smoking about 24 years ago. His smoking use included pipe. He has quit using smokeless tobacco. He reports that he does not drink alcohol and does not use drugs.  Allergies  Allergen Reactions   Atorvastatin Other (See Comments)    Unknown reaction   Colesevelam Other (See Comments)    Unknown reaction    Simvastatin Other (See Comments)    Unknown reaction   Sulfa Antibiotics Other (See Comments)    Unknown reaction    Family History  Problem Relation Age of Onset   Coronary artery disease Other    Hypertension Other     Prior to Admission medications   Medication Sig Start Date End Date Taking? Authorizing Provider  acetaminophen (TYLENOL) 325 MG tablet Take 1-2 tablets (325-650 mg total) by mouth every 4 (four) hours as needed for mild pain. 10/03/19   Love, Evlyn Kanner, PA-C  aspirin EC (ASPIRIN 81) 81 MG tablet Take 81 mg by mouth daily. Swallow whole.    [provider]  atenolol (TENORMIN) 25 MG tablet TAKE 1/2 TABLET BY MOUTH EVERY DAY 02/21/22   Plotnikov, Georgina Quint, MD  clindamycin (CLINDAGEL) 1 % gel APPLY TOPICALLY 2 (TWO) TIMES DAILY. USE TWICE DAILY ON THE BACK OF THE NECK 06/13/22   Plotnikov, Georgina Quint, MD  clotrimazole-betamethasone (LOTRISONE) cream APPLY TO AFFECTED AREA TWICE A DAY 06/14/22   Plotnikov, Georgina Quint, MD  escitalopram (LEXAPRO) 10 MG tablet TAKE 1 TABLET BY MOUTH EVERY DAY 06/06/22   Plotnikov, Georgina Quint, MD  ezetimibe (ZETIA) 10 MG tablet Take 1 tablet by mouth daily. 05/24/20   [provider]  fluconazole (DIFLUCAN) 100 MG tablet Take 2 tabs on day#1, then 1 tab daily on Days #2-10 02/10/22   Plotnikov, Georgina Quint, MD  lactulose (CHRONULAC) 10 GM/15ML solution 30-60 ml bid po prn 01/02/20   Plotnikov, Georgina Quint, MD  OVER THE COUNTER MEDICATION 2 capsules daily. Lions mane capsules    [provider]  pantoprazole (PROTONIX) 40 MG tablet Take 1 tablet (40 mg total) by mouth daily. 02/21/22   Plotnikov, Georgina Quint, MD  polyethylene glycol (MIRALAX / GLYCOLAX) 17 g packet Take 17 g by mouth daily.    [provider]  senna-docusate (SENOKOT-S) 8.6-50 MG tablet Take 2 tablets by mouth 2 (two) times daily. Patient taking differently: Take 2 tablets by mouth daily. 12/18/19   Ngetich, Donalee Citrin, NP    Physical Exam: Vitals:   08/20/22  1430 08/20/22 1432 08/20/22 1500 08/20/22 1700  BP: (!) 101/48  (!) 104/52 105/62  Pulse: 71 71 69 60  Resp: (!) 22 (!) 23 (!) 22   Temp:    98 F (36.7 C)  TempSrc:    Oral  SpO2: 99% 99% 99% 97%   General:  Appears calm and comfortable and is in NAD. Sleepy but arousable.  Eyes:  PERRL, EOMI, normal lids, iris ENT:  grossly normal hearing, lips & tongue, mmm; no teeth  Neck:  no LAD, masses or thyromegaly; no carotid bruits Cardiovascular:  RRR, no m/r/g. No LE edema.  Respiratory:   CTA bilaterally with no wheezes/rales/rhonchi.  Normal respiratory effort. Abdomen:  soft, NT, ND, NABS Back:   normal alignment, no CVAT Skin:  no rash or induration seen on limited exam Musculoskeletal:  right sided hemiparesis at baseline. Left arm more rigid.  Lower extremity:  No LE edema.  Limited foot exam with no ulcerations.  2+ distal pulses. Psychiatric:  sleepy, but easily arouses. Answers questions with same answers. Oriented to self only. Speech dysarthric at baseline.  Neurologic:  would not follow commands or stay awake long enough for exam. No obvious deficit. ? Left mouth droop. Right sided hemiparesis at baseline. Left upper extremity with increased rigidity/tonicity. DTR 2+.    Radiological Exams on Admission: Independently reviewed - see discussion in A/P where applicable  CT HEAD WO CONTRAST  Result Date: 08/20/2022 CLINICAL DATA:  Mental status change, persistent or worsening. EXAM: CT HEAD WITHOUT CONTRAST TECHNIQUE: Contiguous axial images were obtained from the base of the skull through the vertex without intravenous contrast. RADIATION DOSE REDUCTION: This exam was performed according to the departmental dose-optimization program which includes automated exposure control, adjustment of the mA and/or kV according to patient size and/or use of iterative reconstruction technique. COMPARISON:  Prior head CT examinations 09/23/2019 FINDINGS: Brain: Moderate generalized cerebral  atrophy. Cavity centered within the posterior limb of left internal capsule at site of prior parenchymal hemorrhage. Chronic small-vessel infarcts within bilateral basal ganglia. Lacunar infarct within the left thalamus, new from the prior head CT of 09/23/2019 but otherwise age indeterminate. Background moderate patchy and ill-defined hypoattenuation within the cerebral white matter, nonspecific but compatible with chronic small vessel ischemic disease. There is no acute intracranial hemorrhage. No extra-axial fluid collection. No evidence of an intracranial mass. No midline shift. Vascular: No hyperdense vessel.  Atherosclerotic calcifications. Skull: No fracture or aggressive osseous lesion. Sinuses/Orbits: No mass or acute finding within the imaged orbits. No significant paranasal sinus disease at the imaged levels. IMPRESSION: 1. Lacunar infarct within the left thalamus, new from the prior head CT of 09/23/2019 but otherwise age indeterminate. Consider a brain MRI for further evaluation. 2. Cavity centered within the posterior limb of left internal capsule at site of prior parenchymal hemorrhage. 3. Background parenchymal atrophy, chronic small vessel ischemic disease and chronic small-vessel infarcts as described. Electronically Signed   By: Jackey LogeKyle  Golden D.O.   On: 08/20/2022 13:17   DG Chest Port 1 View  Result Date: 08/20/2022 CLINICAL DATA:  Altered mental status EXAM: PORTABLE CHEST 1 VIEW COMPARISON:  09/28/2020 FINDINGS: Stable heart size status post sternotomy and CABG. Prior cardiac valve replacement. Aortic atherosclerosis. Low lung volumes. Small layering left-sided pleural effusion. Mild streaky left basilar opacity. No pneumothorax. IMPRESSION: Small layering left-sided pleural effusion with mild streaky left basilar opacity, likely atelectasis. Electronically Signed   By: Duanne GuessNicholas  Plundo D.O.   On: 08/20/2022 12:49    EKG: Independently reviewed.  NSR with rate 79; nonspecific ST changes  with no evidence of acute ischemia. LBBB-similar to previous    Labs on Admission: I have personally reviewed the available labs and imaging studies at the time of the admission.  Pertinent labs:   platelets 110,  COVID positive,  Assessment and Plan: Principal Problem:   Acute encephalopathy Active Problems:   COVID-19 virus infection   Weakness   THROMBOCYTOPENIA   Hemiplegia and hemiparesis following cerebral infarction affecting right dominant side   Coronary artery disease involving native coronary artery of native heart without angina pectoris   S/P AVR (aortic valve replacement)   Dyslipidemia   Essential hypertension   COPD (chronic obstructive pulmonary disease)   GERD   Depression, major, single episode, moderate    Assessment and Plan: * Acute encephalopathy 89 year old male presenting with 2 day onset worsening lethargy, weakness, worsening dysarthria from baseline and  confusion  -obs to telemetry  -found to be covid positive which could be driving factor of everything  -CT head with lacunar infarct within the left thalamus, new from prior head CT on 09/2019,but otherwise age indeterminate. MRI brain pending, if any acute findings, neurology will see -neuro checks -NPO Until more alert-family denies any aspiration symptoms, but will have aspiration precautions and SLP to see.  -ammonia wnl, TSH/B12 pending -Bc pending, but no obvious bacterial source -UA pending, culture   COVID-19 virus infection He has had no respiratory symptoms or fever at home and only symptoms has been lethargy Discussed if less than 5 days of symptoms we could do paxolovid, but since we can't verify exact onset of symptoms will continue supportive care a this time -PPE/airborne and contact precautions -no respiratory complains/symptoms -supportive care -gentle IVF -PT/OT   Weakness Likely secondary to covid infection  PT/OT/SLP If does not return to baseline will need  rehab/SNF Check TSH/B12  Gentle, time limited IVF Fall precautions   THROMBOCYTOPENIA Chronic thrombocytopenia dating back to 2012 Slightly worse in setting of viral infection Trend   Hemiplegia and hemiparesis following cerebral infarction affecting right dominant side At baseline PT/OT   Coronary artery disease involving native coronary artery of native heart without angina pectoris S/p CABG Stable Continue  medical management with zetia, ASA and beta blocker Statin intolerant   S/P AVR (aortic valve replacement) Echo 08/2019: Bioprosthetic aortic valve. No significant stenosis or regurgitation  noted.   Dyslipidemia Statin intolerant, continue zetia Lipid panel pending   Essential hypertension BP soft, hold atenolol for now  Allow for permissive HTN until acute CVA ruled out IV parameters placed PRN   COPD (chronic obstructive pulmonary disease) No s/sx of exacerbation No rescue inhaler or maintenance inhaler   GERD Continue protonix >IV while NPO   Depression, major, single episode, moderate Continue lexapro     Advance Care Planning:   Code Status: DNR   Consults: neurology-will see if MRI abnormal   DVT Prophylaxis: SCDs until MRI results   Family Communication: wife and sister in law at bedside   Severity of Illness: The appropriate patient status for this patient is OBSERVATION. Observation status is judged to be reasonable and necessary in order to provide the required intensity of service to ensure the patient's safety. The patient's presenting symptoms, physical exam findings, and initial radiographic and laboratory data in the context of their medical condition is felt to place them at decreased risk for further clinical deterioration. Furthermore, it is anticipated that the patient will be medically stable for discharge from the hospital within 2 midnights of admission.   Author: Orland Mustard, MD 08/20/2022 6:01 PM  For on call review  [email or web address].

## 2022-08-20 NOTE — Assessment & Plan Note (Signed)
Echo 08/2019: Bioprosthetic aortic valve. No significant stenosis or regurgitation  noted.

## 2022-08-20 NOTE — Assessment & Plan Note (Signed)
89 year old male presenting with 2 day onset worsening lethargy, weakness, worsening dysarthria from baseline and confusion  -obs to telemetry  -found to be covid positive which could be driving factor of everything  -CT head with lacunar infarct within the left thalamus, new from prior head CT on 09/2019,but otherwise age indeterminate. MRI brain pending, if any acute findings, neurology will see -neuro checks -NPO Until more alert-family denies any aspiration symptoms, but will have aspiration precautions and SLP to see.  -ammonia wnl, TSH/B12 pending -Bc pending, but no obvious bacterial source -UA pending, culture

## 2022-08-20 NOTE — Assessment & Plan Note (Signed)
Statin intolerant, continue zetia Lipid panel pending

## 2022-08-20 NOTE — ED Provider Notes (Signed)
[place] EMERGENCY DEPARTMENT AT [facility] Provider Note   CSN: [id number] Arrival date & time: 08/20/22  1144     History  No chief complaint on file.   David [name] is a 89 y.o. male.  89 yo M with a chief complaints of altered mental status.  This is reported by the family.  Not acting right per them.  Has been coughing and having diarrhea reportedly as well.  Patient is unable to give me much history.  Level 5 caveat altered mental status.        Home Medications Prior to Admission medications   Medication Sig Start Date End Date Taking? Authorizing Provider  acetaminophen (TYLENOL) 325 MG tablet Take 1-2 tablets (325-650 mg total) by mouth every 4 (four) hours as needed for mild pain. 10/03/19   Love, Evlyn Kanner, PA-C  aspirin EC (ASPIRIN 81) 81 MG tablet Take 81 mg by mouth daily. Swallow whole.    [provider]  atenolol (TENORMIN) 25 MG tablet TAKE 1/2 TABLET BY MOUTH EVERY DAY 02/21/22   Plotnikov, Georgina Quint, MD  clindamycin (CLINDAGEL) 1 % gel APPLY TOPICALLY 2 (TWO) TIMES DAILY. USE TWICE DAILY ON THE BACK OF THE NECK 06/13/22   Plotnikov, Georgina Quint, MD  clotrimazole-betamethasone (LOTRISONE) cream APPLY TO AFFECTED AREA TWICE A DAY 06/14/22   Plotnikov, Georgina Quint, MD  escitalopram (LEXAPRO) 10 MG tablet TAKE 1 TABLET BY MOUTH EVERY DAY 06/06/22   Plotnikov, Georgina Quint, MD  ezetimibe (ZETIA) 10 MG tablet Take 1 tablet by mouth daily. 05/24/20   [provider]  fluconazole (DIFLUCAN) 100 MG tablet Take 2 tabs on day#1, then 1 tab daily on Days #2-10 02/10/22   Plotnikov, Georgina Quint, MD  lactulose (CHRONULAC) 10 GM/15ML solution 30-60 ml bid po prn 01/02/20   Plotnikov, Georgina Quint, MD  OVER THE COUNTER MEDICATION 2 capsules daily. Lions mane capsules    [provider]  pantoprazole (PROTONIX) 40 MG tablet Take 1 tablet (40 mg total) by mouth daily. 02/21/22   Plotnikov, Georgina Quint, MD  polyethylene glycol (MIRALAX / GLYCOLAX) 17 g packet Take  17 g by mouth daily.    [provider]  senna-docusate (SENOKOT-S) 8.6-50 MG tablet Take 2 tablets by mouth 2 (two) times daily. Patient taking differently: Take 2 tablets by mouth daily. 12/18/19   Ngetich, Dinah C, NP      Allergies    Atorvastatin, Colesevelam, Simvastatin, and Sulfa antibiotics    Review of Systems   Review of Systems  Physical Exam Updated Vital Signs BP (!) 104/52   Pulse 69   Temp 99.6 F (37.6 C) (Axillary)   Resp (!) 22   SpO2 99%  Physical Exam Vitals and nursing note reviewed.  Constitutional:      Appearance: He is well-developed.  HENT:     Head: Normocephalic and atraumatic.  Eyes:     Pupils: Pupils are equal, round, and reactive to light.  Neck:     Vascular: No JVD.  Cardiovascular:     Rate and Rhythm: Normal rate and regular rhythm.     Heart sounds: No murmur heard.    No friction rub. No gallop.  Pulmonary:     Effort: No respiratory distress.     Breath sounds: No wheezing.  Abdominal:     General: There is no distension.     Tenderness: There is no abdominal tenderness. There is no guarding or rebound.  Musculoskeletal:  General: Normal range of motion.     Cervical back: Normal range of motion and neck supple.  Skin:    Coloration: Skin is not pale.     Findings: No rash.  Neurological:     Mental Status: He is alert.     Comments: Patient answers yes and no but 64 different ways he seems to get the answers to mixed up.  Has trouble following commands and will do it transiently.  No obvious focal weakness but unable to get him to lift his legs off the bed.  Psychiatric:        Behavior: Behavior normal.     ED Results / Procedures / Treatments   Labs (all labs ordered are listed, but only abnormal results are displayed) Labs Reviewed  RESP PANEL BY RT-PCR (RSV, FLU A&B, COVID)  RVPGX2 - Abnormal; Notable for the following components:      Result Value   SARS Coronavirus 2 by RT PCR POSITIVE (*)    All  other components within normal limits  COMPREHENSIVE METABOLIC PANEL - Abnormal; Notable for the following components:   Sodium 133 (*)    Glucose, Bld 123 (*)    Calcium 8.3 (*)    Total Protein 6.2 (*)    Albumin 2.9 (*)    GFR, Estimated 53 (*)    All other components within normal limits  CBC WITH DIFFERENTIAL/PLATELET - Abnormal; Notable for the following components:   Platelets 110 (*)    Lymphs Abs 0.4 (*)    All other components within normal limits  CBG MONITORING, ED - Abnormal; Notable for the following components:   Glucose-Capillary 147 (*)    All other components within normal limits  I-STAT VENOUS BLOOD GAS, ED - Abnormal; Notable for the following components:   pCO2, Ven 39.2 (*)    pO2, Ven 81 (*)    Calcium, Ion 1.08 (*)    All other components within normal limits  CULTURE, BLOOD (ROUTINE X 2)  CULTURE, BLOOD (ROUTINE X 2)  AMMONIA  LACTIC ACID, PLASMA  CBC WITH DIFFERENTIAL/PLATELET  URINALYSIS, ROUTINE W REFLEX MICROSCOPIC    EKG EKG Interpretation  Date/Time:  Saturday August 20 2022 12:01:29 EDT Ventricular Rate:  71 PR Interval:  75 QRS Duration: 148 QT Interval:  422 QTC Calculation: 459 R Axis:   25 Text Interpretation: Sinus rhythm Short PR interval Left bundle branch block Artifact in lead(s) I III aVR aVL aVF V1 V2 No significant change since last tracing Confirmed by Melene Plan [phone]) on 08/20/2022 12:32:44 PM  Radiology CT HEAD WO CONTRAST  Result Date: 08/20/2022 CLINICAL DATA:  Mental status change, persistent or worsening. EXAM: CT HEAD WITHOUT CONTRAST TECHNIQUE: Contiguous axial images were obtained from the base of the skull through the vertex without intravenous contrast. RADIATION DOSE REDUCTION: This exam was performed according to the departmental dose-optimization program which includes automated exposure control, adjustment of the mA and/or kV according to patient size and/or use of iterative reconstruction technique. COMPARISON:   Prior head CT examinations 09/23/2019 FINDINGS: Brain: Moderate generalized cerebral atrophy. Cavity centered within the posterior limb of left internal capsule at site of prior parenchymal hemorrhage. Chronic small-vessel infarcts within bilateral basal ganglia. Lacunar infarct within the left thalamus, new from the prior head CT of 09/23/2019 but otherwise age indeterminate. Background moderate patchy and ill-defined hypoattenuation within the cerebral white matter, nonspecific but compatible with chronic small vessel ischemic disease. There is no acute intracranial hemorrhage. No extra-axial fluid collection. No  evidence of an intracranial mass. No midline shift. Vascular: No hyperdense vessel.  Atherosclerotic calcifications. Skull: No fracture or aggressive osseous lesion. Sinuses/Orbits: No mass or acute finding within the imaged orbits. No significant paranasal sinus disease at the imaged levels. IMPRESSION: 1. Lacunar infarct within the left thalamus, new from the prior head CT of 09/23/2019 but otherwise age indeterminate. Consider a brain MRI for further evaluation. 2. Cavity centered within the posterior limb of left internal capsule at site of prior parenchymal hemorrhage. 3. Background parenchymal atrophy, chronic small vessel ischemic disease and chronic small-vessel infarcts as described. Electronically Signed   By: Jackey LogeKyle  Golden D.O.   On: 08/20/2022 13:17   DG Chest Port 1 View  Result Date: 08/20/2022 CLINICAL DATA:  Altered mental status EXAM: PORTABLE CHEST 1 VIEW COMPARISON:  09/28/2020 FINDINGS: Stable heart size status post sternotomy and CABG. Prior cardiac valve replacement. Aortic atherosclerosis. Low lung volumes. Small layering left-sided pleural effusion. Mild streaky left basilar opacity. No pneumothorax. IMPRESSION: Small layering left-sided pleural effusion with mild streaky left basilar opacity, likely atelectasis. Electronically Signed   By: Duanne GuessNicholas  Plundo D.O.   On: 08/20/2022  12:49    Procedures .Critical Care  Performed by: Melene PlanFloyd, Anarely Nicholls, DO Authorized by: Melene PlanFloyd, Zeba Luby, DO   Critical care provider statement:    Critical care time (minutes):  35   Critical care time was exclusive of:  Separately billable procedures and treating other patients   Critical care was time spent personally by me on the following activities:  Development of treatment plan with patient or surrogate, discussions with consultants, evaluation of patient's response to treatment, examination of patient, ordering and review of laboratory studies, ordering and review of radiographic studies, ordering and performing treatments and interventions, pulse oximetry, re-evaluation of patient's condition and review of old charts   Care discussed with: admitting provider       Medications Ordered in ED Medications  sodium chloride 0.9 % bolus 1,000 mL (0 mLs Intravenous Stopped 08/20/22 1432)    ED Course/ Medical Decision Making/ A&P                             Medical Decision Making Amount and/or Complexity of Data Reviewed Labs: ordered. Radiology: ordered.  Risk Decision regarding hospitalization.   89 yo M with a chief complaints of altered mental status.  This occurred in the setting of reported diarrhea and cough.  Going on for couple days.  Mental status change worse today.  Will obtain a laboratory evaluation.  CT of the head.  COVID and flu testing.  Reassess.  Family has arrived and provide further history.  They tell me that he has had a cough that is been ongoing.  They think maybe it sounds more rapidly over the past couple days.  Has had it for months.  He also had 1 loose stool last night.  Otherwise has had no increase stool frequency.  There chief concern was that he had a lot of trouble communicating.  He typically has trouble communicating, but usually his wife is able to understand what he is trying to tell them.  She is not unable to figure out anything he has been saying since  last night.  He tries to communicate but is unable to get it out properly.  He has been eating less for some time she denies any acute change to them.  Denies complaining of anything that might be painful.  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  They feel like the left side now is more weak than it has been.  I discussed the case with Dr. Amada Jupiter, neurology.  Agreed with MRI brain.  Will come and evaluate the patient at bedside.  Will discuss with medicine for admission.  The patients results and plan were reviewed and discussed.   Any x-rays performed were independently reviewed by myself.   Differential diagnosis were considered with the presenting HPI.  Medications  sodium chloride 0.9 % bolus 1,000 mL (0 mLs Intravenous Stopped 08/20/22 1432)    Vitals:   08/20/22 1412 08/20/22 1430 08/20/22 1432 08/20/22 1500  BP:  (!) 101/48  (!) 104/52  Pulse: 75 71 71 69  Resp: 18 (!) 22 (!) 23 (!) 22  Temp:      TempSrc:      SpO2: 98% 99% 99% 99%    Final diagnoses:  COVID-19 virus infection    Admission/ observation were discussed with the admitting physician, patient and/or family and they are comfortable with the plan.          Final Clinical Impression(s) / ED Diagnoses Final diagnoses:  COVID-19 virus infection    Rx / DC Orders ED Discharge Orders     None         Melene Plan, DO 08/20/22 1513

## 2022-08-20 NOTE — Assessment & Plan Note (Addendum)
Continue protonix >IV while NPO

## 2022-08-21 ENCOUNTER — Observation Stay (HOSPITAL_COMMUNITY): Payer: No Typology Code available for payment source

## 2022-08-21 DIAGNOSIS — F418 Other specified anxiety disorders: Secondary | ICD-10-CM | POA: Diagnosis not present

## 2022-08-21 DIAGNOSIS — Z515 Encounter for palliative care: Secondary | ICD-10-CM | POA: Diagnosis not present

## 2022-08-21 DIAGNOSIS — Z953 Presence of xenogenic heart valve: Secondary | ICD-10-CM | POA: Diagnosis not present

## 2022-08-21 DIAGNOSIS — J449 Chronic obstructive pulmonary disease, unspecified: Secondary | ICD-10-CM | POA: Diagnosis not present

## 2022-08-21 DIAGNOSIS — G9349 Other encephalopathy: Secondary | ICD-10-CM | POA: Diagnosis present

## 2022-08-21 DIAGNOSIS — R531 Weakness: Secondary | ICD-10-CM

## 2022-08-21 DIAGNOSIS — M1990 Unspecified osteoarthritis, unspecified site: Secondary | ICD-10-CM | POA: Diagnosis not present

## 2022-08-21 DIAGNOSIS — R2681 Unsteadiness on feet: Secondary | ICD-10-CM | POA: Diagnosis not present

## 2022-08-21 DIAGNOSIS — U071 COVID-19: Secondary | ICD-10-CM | POA: Diagnosis not present

## 2022-08-21 DIAGNOSIS — Z87891 Personal history of nicotine dependence: Secondary | ICD-10-CM | POA: Diagnosis not present

## 2022-08-21 DIAGNOSIS — K219 Gastro-esophageal reflux disease without esophagitis: Secondary | ICD-10-CM

## 2022-08-21 DIAGNOSIS — E8809 Other disorders of plasma-protein metabolism, not elsewhere classified: Secondary | ICD-10-CM | POA: Diagnosis present

## 2022-08-21 DIAGNOSIS — Z952 Presence of prosthetic heart valve: Secondary | ICD-10-CM

## 2022-08-21 DIAGNOSIS — Z79899 Other long term (current) drug therapy: Secondary | ICD-10-CM | POA: Diagnosis not present

## 2022-08-21 DIAGNOSIS — I69351 Hemiplegia and hemiparesis following cerebral infarction affecting right dominant side: Secondary | ICD-10-CM | POA: Diagnosis not present

## 2022-08-21 DIAGNOSIS — D696 Thrombocytopenia, unspecified: Secondary | ICD-10-CM

## 2022-08-21 DIAGNOSIS — R4182 Altered mental status, unspecified: Secondary | ICD-10-CM | POA: Diagnosis not present

## 2022-08-21 DIAGNOSIS — L8990 Pressure ulcer of unspecified site, unspecified stage: Secondary | ICD-10-CM | POA: Diagnosis not present

## 2022-08-21 DIAGNOSIS — F03C3 Unspecified dementia, severe, with mood disturbance: Secondary | ICD-10-CM | POA: Diagnosis present

## 2022-08-21 DIAGNOSIS — C439 Malignant melanoma of skin, unspecified: Secondary | ICD-10-CM | POA: Diagnosis not present

## 2022-08-21 DIAGNOSIS — F32A Depression, unspecified: Secondary | ICD-10-CM | POA: Diagnosis present

## 2022-08-21 DIAGNOSIS — F321 Major depressive disorder, single episode, moderate: Secondary | ICD-10-CM | POA: Diagnosis not present

## 2022-08-21 DIAGNOSIS — I69391 Dysphagia following cerebral infarction: Secondary | ICD-10-CM | POA: Diagnosis not present

## 2022-08-21 DIAGNOSIS — I10 Essential (primary) hypertension: Secondary | ICD-10-CM

## 2022-08-21 DIAGNOSIS — E785 Hyperlipidemia, unspecified: Secondary | ICD-10-CM

## 2022-08-21 DIAGNOSIS — D72819 Decreased white blood cell count, unspecified: Secondary | ICD-10-CM | POA: Diagnosis present

## 2022-08-21 DIAGNOSIS — E871 Hypo-osmolality and hyponatremia: Secondary | ICD-10-CM | POA: Diagnosis present

## 2022-08-21 DIAGNOSIS — G9340 Encephalopathy, unspecified: Secondary | ICD-10-CM | POA: Diagnosis not present

## 2022-08-21 DIAGNOSIS — Z8616 Personal history of COVID-19: Secondary | ICD-10-CM | POA: Diagnosis not present

## 2022-08-21 DIAGNOSIS — Z7401 Bed confinement status: Secondary | ICD-10-CM | POA: Diagnosis not present

## 2022-08-21 DIAGNOSIS — J9811 Atelectasis: Secondary | ICD-10-CM | POA: Diagnosis present

## 2022-08-21 DIAGNOSIS — Z66 Do not resuscitate: Secondary | ICD-10-CM | POA: Diagnosis present

## 2022-08-21 DIAGNOSIS — I2510 Atherosclerotic heart disease of native coronary artery without angina pectoris: Secondary | ICD-10-CM

## 2022-08-21 DIAGNOSIS — I69922 Dysarthria following unspecified cerebrovascular disease: Secondary | ICD-10-CM | POA: Diagnosis not present

## 2022-08-21 DIAGNOSIS — I69322 Dysarthria following cerebral infarction: Secondary | ICD-10-CM | POA: Diagnosis not present

## 2022-08-21 DIAGNOSIS — Z7189 Other specified counseling: Secondary | ICD-10-CM | POA: Diagnosis not present

## 2022-08-21 DIAGNOSIS — M6281 Muscle weakness (generalized): Secondary | ICD-10-CM | POA: Diagnosis not present

## 2022-08-21 DIAGNOSIS — L89309 Pressure ulcer of unspecified buttock, unspecified stage: Secondary | ICD-10-CM | POA: Diagnosis not present

## 2022-08-21 DIAGNOSIS — Z8249 Family history of ischemic heart disease and other diseases of the circulatory system: Secondary | ICD-10-CM | POA: Diagnosis not present

## 2022-08-21 DIAGNOSIS — R1312 Dysphagia, oropharyngeal phase: Secondary | ICD-10-CM | POA: Diagnosis not present

## 2022-08-21 DIAGNOSIS — J90 Pleural effusion, not elsewhere classified: Secondary | ICD-10-CM | POA: Diagnosis present

## 2022-08-21 DIAGNOSIS — Z951 Presence of aortocoronary bypass graft: Secondary | ICD-10-CM | POA: Diagnosis not present

## 2022-08-21 LAB — COMPREHENSIVE METABOLIC PANEL
ALT: 9 U/L (ref 0–44)
AST: 26 U/L (ref 15–41)
Albumin: 2.8 g/dL — ABNORMAL LOW (ref 3.5–5.0)
Alkaline Phosphatase: 45 U/L (ref 38–126)
Anion gap: 9 (ref 5–15)
BUN: 15 mg/dL (ref 8–23)
CO2: 23 mmol/L (ref 22–32)
Calcium: 8.4 mg/dL — ABNORMAL LOW (ref 8.9–10.3)
Chloride: 104 mmol/L (ref 98–111)
Creatinine, Ser: 1.2 mg/dL (ref 0.61–1.24)
GFR, Estimated: 55 mL/min — ABNORMAL LOW (ref >60)
Glucose, Bld: 80 mg/dL (ref 70–99)
Potassium: 4.3 mmol/L (ref 3.5–5.1)
Sodium: 136 mmol/L (ref 135–145)
Total Bilirubin: 0.9 mg/dL (ref 0.3–1.2)
Total Protein: 5.9 g/dL — ABNORMAL LOW (ref 6.5–8.1)

## 2022-08-21 LAB — CBC WITH DIFFERENTIAL/PLATELET
Abs Immature Granulocytes: 0.03 10*3/uL (ref 0.00–0.07)
Basophils Absolute: 0 10*3/uL (ref 0.0–0.1)
Basophils Relative: 0 %
Eosinophils Absolute: 0.1 10*3/uL (ref 0.0–0.5)
Eosinophils Relative: 2 %
HCT: 43.2 % (ref 39.0–52.0)
Hemoglobin: 14.2 g/dL (ref 13.0–17.0)
Immature Granulocytes: 1 %
Lymphocytes Relative: 25 %
Lymphs Abs: 1.5 10*3/uL (ref 0.7–4.0)
MCH: 31 pg (ref 26.0–34.0)
MCHC: 32.9 g/dL (ref 30.0–36.0)
MCV: 94.3 fL (ref 80.0–100.0)
Monocytes Absolute: 1 10*3/uL (ref 0.1–1.0)
Monocytes Relative: 17 %
Neutro Abs: 3.3 10*3/uL (ref 1.7–7.7)
Neutrophils Relative %: 55 %
Platelets: 93 10*3/uL — ABNORMAL LOW (ref 150–400)
RBC: 4.58 MIL/uL (ref 4.22–5.81)
RDW: 13.4 % (ref 11.5–15.5)
WBC: 6 10*3/uL (ref 4.0–10.5)
nRBC: 0 % (ref 0.0–0.2)

## 2022-08-21 LAB — CULTURE, BLOOD (ROUTINE X 2): Culture: NO GROWTH

## 2022-08-21 LAB — LIPID PANEL
Cholesterol: 132 mg/dL (ref 0–200)
HDL: 33 mg/dL — ABNORMAL LOW (ref >40)
LDL Cholesterol: 88 mg/dL (ref 0–99)
Total CHOL/HDL Ratio: 4 RATIO
Triglycerides: 57 mg/dL (ref <150)
VLDL: 11 mg/dL (ref 0–40)

## 2022-08-21 LAB — MAGNESIUM: Magnesium: 2 mg/dL (ref 1.7–2.4)

## 2022-08-21 LAB — PHOSPHORUS: Phosphorus: 3.2 mg/dL (ref 2.5–4.6)

## 2022-08-21 MED ORDER — CYANOCOBALAMIN 1000 MCG/ML IJ SOLN
1000.0000 ug | Freq: Once | INTRAMUSCULAR | Status: AC
  Administered 2022-08-21: 1000 ug via INTRAMUSCULAR
  Filled 2022-08-21: qty 1

## 2022-08-21 MED ORDER — VITAMIN B-12 1000 MCG PO TABS
1000.0000 ug | ORAL_TABLET | Freq: Every day | ORAL | Status: DC
  Administered 2022-08-22 – 2022-08-27 (×6): 1000 ug via ORAL
  Filled 2022-08-21 (×6): qty 1

## 2022-08-21 MED ORDER — SODIUM CHLORIDE 0.9 % IV SOLN: INTRAVENOUS | Status: AC

## 2022-08-21 NOTE — Progress Notes (Signed)
OT Evaluation:  Clinical Impression: Pt is typically assisted at home by his daughters and son and caregivers that come in twice during the day. He is getting PT at home as well. He is a 2 person lift with equipment they have at home "sit and stand" and he gets assist for all ADL except feeding and some grooming. Today he presents with decreased strength and overall decreased activity tolerance. He did well in the stedy requiring min A +2 for transfer sit<>stand, able to maintain standing for max A peri care, set up for grooming. Overall set up to mod A for UB ADL and max A for LB ADL. After conversation with daughter, at this time recommending post acute OT <3 hours daily with hopes that he will return closer to baseline so that he can go home with family and get therapy in his natural environment.     08/21/22 1243  OT Visit Information  Last OT Received On 08/21/22  Assistance Needed +2  PT/OT/SLP Co-Evaluation/Treatment Yes  Reason for Co-Treatment Necessary to address cognition/behavior during functional activity;For patient/therapist safety;To address functional/ADL transfers  PT goals addressed during session Mobility/safety with mobility;Balance;Proper use of DME;Strengthening/ROM  OT goals addressed during session ADL's and self-care;Strengthening/ROM;Proper use of Adaptive equipment and DME  History of Present Illness David [name] is a 89 y.o. male admitted 08/20/22 with speech difficulty worse than baseline, lethargy, diarrhea. CT revealed new Lacunar infarct within the left thalamus, also found to be COVID +. 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  Precautions  Precautions Fall  Precaution Comments R hemi at baseline  Required Braces or Orthoses Other Brace (ankle brace and shoes to be worn for transfers)  [facility]  Restrictions  Weight Bearing Restrictions No  Home  Living  Family/patient expects to be discharged to: Private residence  Living Arrangements Children  Available Help at Discharge Family;Available 24 hours/day;Personal care attendant (in the morning and afternoon)  Type of Home House  Home Access Ramped entrance  Home Layout One level  Bathroom Shower/Tub Walk-in shower  Bathroom Toilet Handicapped height  Bathroom Accessibility Yes  How Accessible Accessible via walker  Home Equipment Hospital bed;Wheelchair - Careers advisermanual;Other (comment);Shower seat;BSC/3in1 ("sit and stand lift")  Additional Comments PCA comes in the morning and afternoon  Prior Function  Prior Level of Function  Needs assist  Physical Assist  Mobility (physical);ADLs (physical)  Mobility (physical) Bed mobility;Transfers (via "sit and stand")  ADLs (physical) Bathing;Dressing;Toileting;IADLs  Mobility Comments transfers only to [facility] and in/out of chairs/bed  Communication  Communication HOH;Expressive difficulties (worse than baseline)  Pain Assessment  Pain Assessment Faces  Faces Pain Scale 0  Pain Intervention(s) Monitored during session;Repositioned  Cognition  Arousal/Alertness Awake/alert  Behavior During Therapy WFL for tasks assessed/performed  Overall Cognitive Status Within Functional Limits for tasks assessed  General Comments sometimes hard to understand expressive aphasia, but follows directions and good safety awareness  Upper Extremity Assessment  Upper Extremity Assessment RUE deficits/detail  RUE Deficits / Details impacted by previous CVA - rigid and does not use functionally  RUE Coordination  (baseline)  Lower Extremity Assessment  Lower Extremity Assessment Defer to PT evaluation  Cervical / Trunk Assessment  Cervical / Trunk Assessment Kyphotic  Vision- History  Baseline Vision/History 1 Wears glasses  Patient Visual Report No change from baseline  ADL  Overall ADL's  Needs assistance/impaired  Eating/Feeding NPO  Grooming Wash/dry  face;Minimal assistance;Sitting  Grooming Details (indicate cue type and reason) EOB  Upper Body Bathing Moderate assistance  Lower Body Bathing Maximal assistance  Upper Body Dressing  Moderate assistance  Lower Body Dressing Maximal assistance  Toilet Transfer Minimal assistance;+2 for physical assistance;+2 for safety/equipment (stedy)  Toileting- Clothing Manipulation and Hygiene Maximal assistance;Sit to/from stand  Toileting - Clothing Manipulation Details (indicate cue type and reason) in stedy  Tub/ Administrator, [employer] Details (indicate cue type and reason) son and 2 daughters help him in the shower every sunday night, otherwise sponge bath  Functional mobility during ADLs Minimal assistance;+2 for physical assistance;+2 for safety/equipment (stedy)  General ADL Comments increased weakness overall  Bed Mobility  Overal bed mobility Needs Assistance  Bed Mobility Supine to Sit  Supine to sit Mod assist;+2 for physical assistance;+2 for safety/equipment  General bed mobility comments Pt good at following cues, assist for legs and trunk elevation  Transfers  Overall transfer level Needs assistance  Equipment used Ambulation equipment used  Transfers Sit to/from Stand  Sit to Stand Min assist;+2 physical assistance;+2 safety/equipment;From elevated surface  General transfer comment stedy used to simulate Pt's "sit and stand" at home, good power up through legs and good controlled descent  Balance  Overall balance assessment Mild deficits observed, not formally tested  Sitting-balance support Feet supported  Sitting balance-Leahy Scale Poor  Sitting balance - Comments varied between min guard-min A for seated balance  General Comments  General comments (skin integrity, edema, etc.) Daughter present and providing background information  OT - End of Session  Equipment Utilized During Treatment Gait belt;Other (comment) (STEDY)  Activity Tolerance  Patient tolerated treatment well  Patient left in chair;with chair alarm set;with call bell/phone within reach;with family/visitor present  Nurse Communication Mobility status  OT Assessment  OT Recommendation/Assessment Patient needs continued OT Services  OT Visit Diagnosis Muscle weakness (generalized) (M62.81);Other symptoms and signs involving the nervous system (R29.898)  OT Problem List Decreased activity tolerance;Impaired balance (sitting and/or standing)  OT Plan  OT Frequency (ACUTE ONLY) Min 2X/week  OT Treatment/Interventions (ACUTE ONLY) Self-care/ADL training;Therapeutic exercise;DME and/or AE instruction;Therapeutic activities;Patient/family education;Balance training  AM-PAC OT "6 Clicks" Daily Activity Outcome Measure (Version 2)  Help from another person eating meals? 1 (NPO)  Help from another person taking care of personal grooming? 2  Help from another person toileting, which includes using toliet, bedpan, or urinal? 2  Help from another person bathing (including washing, rinsing, drying)? 2  Help from another person to put on and taking off regular upper body clothing? 2  Help from another person to put on and taking off regular lower body clothing? 2  6 Click Score 11  Progressive Mobility  What is the highest level of mobility based on the progressive mobility assessment? Level 2 (Chairfast) - Balance while sitting on edge of bed and cannot stand  Mobility Referral Yes (transfer with stedy/lift only)  Activity Transferred from bed to chair  OT Recommendation  Recommendations for Other Services PT consult  Follow Up Recommendations Skilled nursing-short term rehab (<3 hours/day) (hopeful to progress to [facility])  Assistance recommended at discharge Frequent or constant Supervision/Assistance  Patient can return home with the following Two people to help with walking and/or transfers;A lot of help with bathing/dressing/bathroom;Assistance with cooking/housework;Direct  supervision/assist for medications management;Direct supervision/assist for financial management;Assist for transportation;Help with stairs or ramp for entrance  Functional Status Assessent Patient has had a recent decline in their functional status  and demonstrates the ability to make significant improvements in function in a reasonable and predictable amount of time.  OT Equipment None recommended by OT (Pt has appropriate DME)  Individuals Consulted  Consulted and Agree with Results and Recommendations Patient;Family member/caregiver  Family Member Consulted Daughter  Acute Rehab OT Goals  Patient Stated Goal get stronger and get back home  OT Goal Formulation With patient/family  Time For Goal Achievement 09/04/22  Potential to Achieve Goals Good  OT Time Calculation  OT Start Time (ACUTE ONLY) 1042  OT Stop Time (ACUTE ONLY) 1120  OT Time Calculation (min) 38 min  OT General Charges  $OT Visit 1 Visit  OT Evaluation  $OT Eval Moderate Complexity 1 Mod  OT Treatments  $Self Care/Home Management  8-22 mins  Written Expression  Dominant Hand Right   Nyoka Cowden OTR/L Acute Rehabilitation Services Office: [phone]

## 2022-08-21 NOTE — Evaluation (Signed)
Physical Therapy Evaluation Patient Details Name: David [name] MRN: [id number] DOB: Jan 27, 1924 Today's Date: 08/21/2022  History of Present Illness  David [name] is a 89 y.o. male admitted 08/20/22 with speech difficulty worse than baseline, lethargy, diarrhea. CT revealed new Lacunar infarct within the left thalamus, also found to be COVID +. 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  Clinical Impression  PTA, pt lives with his daughter, uses a sit to stand lift for wheelchair transfers with two person assist and requires assist for ADL's. Pt has an aide 5 days/wk in the AM and PM. Pt overall is pleasant and agreeable to participate. Requiring min-mod assist (+2 safety) for bed mobility and tranfers via Stedy to simulate transfer set up at home. Pt daughter reporting pt is currently weaker than his baseline. Currently recommending post acute rehab, however, pt does have potential to progress home with continued acute therapy services. Will continue to assess.     Recommendations for follow up therapy are one component of a multi-disciplinary discharge planning process, led by the attending physician.  Recommendations may be updated based on patient status, additional functional criteria and insurance authorization.  Follow Up Recommendations Can patient physically be transported by private vehicle: No     Assistance Recommended at Discharge Frequent or constant Supervision/Assistance  Patient can return home with the following  Two people to help with walking and/or transfers;A lot of help with bathing/dressing/bathroom    Equipment Recommendations None recommended by PT (pt well equipped)  Recommendations for Other Services       Functional Status Assessment Patient has had a recent decline in their functional status and demonstrates the ability to make significant improvements in function in a  reasonable and predictable amount of time.     Precautions / Restrictions Precautions Precautions: Fall Precaution Comments: R hemi at baseline Required Braces or Orthoses: Other Brace (ankle brace and shoes to be worn for transfers) Other Brace: R ankle brace from [facility] Restrictions Weight Bearing Restrictions: No      Mobility  Bed Mobility Overal bed mobility: Needs Assistance Bed Mobility: Supine to Sit     Supine to sit: Mod assist, +2 for physical assistance, +2 for safety/equipment     General bed mobility comments: Pt good at following cues, assist for legs and trunk elevation    Transfers Overall transfer level: Needs assistance Equipment used: Ambulation equipment used Transfers: Sit to/from Stand Sit to Stand: Min assist, +2 physical assistance, +2 safety/equipment, From elevated surface           General transfer comment: stedy used to simulate Pt's "sit and stand" at home, good power up through legs and good controlled descent    Ambulation/Gait                  Stairs            Wheelchair Mobility    Modified Rankin (Stroke Patients Only)       Balance Overall balance assessment: Needs assistance Sitting-balance support: Feet supported Sitting balance-Leahy Scale: Poor Sitting balance - Comments: Requiring min guard-minA                                     Pertinent Vitals/Pain Pain Assessment Pain Assessment: Faces Faces Pain Scale: No hurt    Home Living Family/patient  expects to be discharged to:: Private residence Living Arrangements: Children Available Help at Discharge: Family;Available 24 hours/day;Personal care attendant (in the morning and afternoon) Type of Home: House Home Access: Ramped entrance       Home Layout: One level Home Equipment: Hospital bed;Wheelchair - Careers adviser (comment);Shower seat;BSC/3in1 ("sit and stand lift") Additional Comments: PCA comes in the morning and afternoon     Prior Function Prior Level of Function : Needs assist       Physical Assist : Mobility (physical);ADLs (physical) Mobility (physical): Bed mobility;Transfers (via "sit and stand") ADLs (physical): Bathing;Dressing;Toileting;IADLs Mobility Comments: transfers only to WC and in/out of chairs/bed       Hand Dominance   Dominant Hand: Right    Extremity/Trunk Assessment   Upper Extremity Assessment Upper Extremity Assessment: Defer to OT evaluation RUE Deficits / Details: impacted by previous CVA - rigid and does not use functionally RUE Coordination:  (baseline)    Lower Extremity Assessment Lower Extremity Assessment: RLE deficits/detail RLE Deficits / Details: Hx residual weakness from prior stroke    Cervical / Trunk Assessment Cervical / Trunk Assessment: Kyphotic  Communication   Communication: HOH;Expressive difficulties (worse than baseline)  Cognition Arousal/Alertness: Awake/alert Behavior During Therapy: WFL for tasks assessed/performed Overall Cognitive Status: Within Functional Limits for tasks assessed                                 General Comments: sometimes hard to understand expressive aphasia, but follows directions and good safety awareness        General Comments General comments (skin integrity, edema, etc.): Daughter present and providing background information    Exercises     Assessment/Plan    PT Assessment Patient needs continued PT services  PT Problem List Decreased strength;Decreased activity tolerance;Decreased balance;Decreased mobility       PT Treatment Interventions DME instruction;Gait training;Functional mobility training;Therapeutic activities;Therapeutic exercise;Balance training;Patient/family education    PT Goals (Current goals can be found in the Care Plan section)  Acute Rehab PT Goals Patient Stated Goal: to get stronger PT Goal Formulation: With patient Time For Goal Achievement: 09/04/22 Potential  to Achieve Goals: Fair    Frequency Min 3X/week     Co-evaluation PT/OT/SLP Co-Evaluation/Treatment: Yes Reason for Co-Treatment: Necessary to address cognition/behavior during functional activity;For patient/therapist safety;To address functional/ADL transfers PT goals addressed during session: Mobility/safety with mobility;Balance;Proper use of DME;Strengthening/ROM OT goals addressed during session: ADL's and self-care;Strengthening/ROM;Proper use of Adaptive equipment and DME       AM-PAC PT "6 Clicks" Mobility  Outcome Measure Help needed turning from your back to your side while in a flat bed without using bedrails?: A Little Help needed moving from lying on your back to sitting on the side of a flat bed without using bedrails?: A Lot Help needed moving to and from a bed to a chair (including a wheelchair)?: A Lot Help needed standing up from a chair using your arms (e.g., wheelchair or bedside chair)?: A Lot Help needed to walk in hospital room?: Total Help needed climbing 3-5 steps with a railing? : Total 6 Click Score: 11    End of Session Equipment Utilized During Treatment: Gait belt;Other (comment) (R AFO) Activity Tolerance: Patient tolerated treatment well Patient left: in chair;with call bell/phone within reach;with chair alarm set Nurse Communication: Mobility status PT Visit Diagnosis: Muscle weakness (generalized) (M62.81);Other abnormalities of gait and mobility (R26.89)    Time: 8453-6468 PT Time Calculation (min) (  ACUTE ONLY): 38 min   Charges:   PT Evaluation $PT Eval Moderate Complexity: 1 Mod          Lillia Pauls, PT, DPT Acute Rehabilitation Services Office [phone]   Norval Morton 08/21/2022, 1:37 PM

## 2022-08-21 NOTE — Plan of Care (Signed)
  Problem: Education: Goal: Knowledge of disease or condition will improve Outcome: Progressing   Problem: Nutrition: Goal: Risk of aspiration will decrease Outcome: Progressing Goal: Dietary intake will improve Outcome: Progressing   Problem: Respiratory: Goal: Complications related to the disease process, condition or treatment will be avoided or minimized Outcome: Progressing   Problem: Clinical Measurements: Goal: Will remain free from infection Outcome: Progressing   Problem: Education: Goal: Knowledge of disease or condition will improve Outcome: Progressing   Problem: Nutrition: Goal: Risk of aspiration will decrease Outcome: Progressing Goal: Dietary intake will improve Outcome: Progressing   Problem: Respiratory: Goal: Complications related to the disease process, condition or treatment will be avoided or minimized Outcome: Progressing   Problem: Clinical Measurements: Goal: Will remain free from infection Outcome: Progressing

## 2022-08-21 NOTE — Evaluation (Signed)
Clinical/Bedside Swallow Evaluation Patient Details  Name: David [name] MRN: [id number] Date of Birth: March 08, 1924  Today's Date: 08/21/2022 Time: SLP Start Time (ACUTE ONLY): 1352 SLP Stop Time (ACUTE ONLY): 1407 SLP Time Calculation (min) (ACUTE ONLY): 15 min  Past Medical History:  Past Medical History:  Diagnosis Date   CAD (coronary artery disease)    intolerance of statins   COPD (chronic obstructive pulmonary disease) (HCC)    Current use of long term anticoagulation    GERD (gastroesophageal reflux disease)    Hyperlipidemia    Hypertension    Impaired glucose tolerance 02/14/2011   LBP (low back pain)    OA (osteoarthritis)    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   Past Surgical History:  Past Surgical History:  Procedure Laterality Date   AORTIC VALVE REPLACEMENT  01/15/1999   bovine   CARDIAC CATHETERIZATION  10/05/1998   Recommend CABG revascularizaition   CARDIAC CATHETERIZATION  06/11/1999   Continue medical therapy   CARDIAC CATHETERIZATION  01/08/2007   Patent grafts, recommend medical therapy   CARDIOVASCULAR STRESS TEST  04/22/2010   Small lateral defect which is partially reversible, no ischemic EKG changes were noted.   CAROTID DOPPLER  04/22/2010   Bilateral ICAs-no evidence of diametere reduction, significant tortuosity, or any other vascular abnormality.   CORONARY ARTERY BYPASS GRAFT  10/09/1998   LIMA to LAD, SVG to diag, SVG sequential to 2 marginals, SVG sequential to PDA & PLA. CE#25 porcine AVR.   TRANSTHORACIC ECHOCARDIOGRAM  05/14/2012   EF 50-55%, bioprosthesis of the aortic valve with well preserved LV function, moderate concentric LVGH   HPI:  David [name] is a 89 y.o. male who presented to ED with complaints of speech difficulty worse than baseline. Found to be COVID+. Family reports decreased PO intake.  Pt with medical history significant of CAD s/p CABG, COPD, GERD, HTN, HLD, AS s/p AVR, hx of CVA with right sided deficits  and dysarthria, depression. Hx of silent aspiraiton per MBS in July and Aug of 2021 following CVA    Assessment / Plan / Recommendation  Clinical Impression  Pt presents with functional swallowing as assessed clinically; however, a concern for prandial aspiration still exists given hx of dysphagia with silent aspiration observed on MBSS in April and May of 2021 following CVA.  Pt tolerated thin liquid by straw, puree, and regular solid graham cracker during today's assessment. Per daughters pt has been consuming soft foods at baseline (meats cut up, softer fruits) and regular liquids via straw.  He has not had recent pneumonia.  Family has noticed some coughing.  He has no hx of reflux, but delayed cough was noted today and they report similar cough at home which increases around bedtime and is noted to worsen with horizontal positioning. He reports feeling of food stuck in cheeks and frequently flosses/cleans teeth per family.  There was no retention of residue in buccal cavity with digital sweep today.  Pt has not had repeat MBS since d/c from CIR following 2021 stroke.  Recommend further assessment of pharyngeal swallow function by MBSS.  Family is in agreement and would very much like reassessment of swallowing.  Given tolerance of thin liquids at home and today's clinical presentation, recommend resuming baseline pending MBSS which will have to be scheduled for afternoon with radiology given COVID+ status. MBSS will allow for limited assessment of cervical esophagus for retention or backflow of contrast and pt remains confused at this time,  so it is preferred over FEES for this pt despite COVID status.    Recommend mechanical soft solid diet with thin liquids.   SLP Visit Diagnosis: Dysphagia, oropharyngeal phase (R13.12)    Aspiration Risk  Mild aspiration risk    Diet Recommendation Dysphagia 3 (Mech soft);Thin liquid   Liquid Administration via: Cup;Straw Supervision: Staff to assist with  self feeding Compensations: Slow rate;Small sips/bites Postural Changes: Seated upright at 90 degrees    Other  Recommendations Oral Care Recommendations: Oral care BID    Recommendations for follow up therapy are one component of a multi-disciplinary discharge planning process, led by the attending physician.  Recommendations may be updated based on patient status, additional functional criteria and insurance authorization.  Follow up Recommendations  (TBD)      Assistance Recommended at Discharge  N/A  Functional Status Assessment  (TBD)  Frequency and Duration  (TBD)          Prognosis Prognosis for improved oropharyngeal function: Fair Barriers to Reach Goals: Cognitive deficits;Time post onset      Swallow Study   General Date of Onset: 08/20/22 HPI: David [name] is a 89 y.o. male who presented to ED with complaints of speech difficulty worse than baseline. Found to be COVID+. Family reports decreased PO intake.  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 Hx of silent aspiraiton per MBS in July and Aug of 2021 following CVA Type of Study: Bedside Swallow Evaluation Diet Prior to this Study: NPO Temperature Spikes Noted: No History of Recent Intubation: No Behavior/Cognition: Alert;Cooperative;Pleasant mood Oral Cavity Assessment: Within Functional Limits Oral Care Completed by SLP: No Oral Cavity - Dentition: Adequate natural dentition Self-Feeding Abilities: Needs assist Patient Positioning: Upright in chair Baseline Vocal Quality: Normal Volitional Cough: Cognitively unable to elicit Volitional Swallow: Unable to elicit    Oral/Motor/Sensory Function Overall Oral Motor/Sensory Function: Mild impairment Facial ROM: Reduced right Facial Symmetry: Abnormal symmetry right Lingual ROM: Within Functional Limits Lingual Symmetry: Within Functional Limits Lingual  Strength: Reduced Velum: Within Functional Limits Mandible: Within Functional Limits   Ice Chips Ice chips: Not tested   Thin Liquid Thin Liquid: Within functional limits Presentation: Straw    Nectar Thick Nectar Thick Liquid: Not tested   Honey Thick Honey Thick Liquid: Not tested   Puree Puree: Within functional limits Presentation: Spoon   Solid     Solid: Within functional limits Presentation:  (SLP fed)      Kerrie Pleasure, MA, CCC-SLP Acute Rehabilitation Services Office: [phone] 08/21/2022,2:53 PM

## 2022-08-21 NOTE — Progress Notes (Signed)
Pt back to room from MRI.

## 2022-08-21 NOTE — Plan of Care (Signed)
  Problem: Education: Goal: Knowledge of disease or condition will improve Outcome: Progressing Goal: Knowledge of secondary prevention will improve (MUST DOCUMENT ALL) Outcome: Progressing Goal: Knowledge of patient specific risk factors will improve David [name] N/A or DELETE if not current risk factor) Outcome: Progressing   Problem: Ischemic Stroke/TIA Tissue Perfusion: Goal: Complications of ischemic stroke/TIA will be minimized Outcome: Progressing   Problem: Education: Goal: Knowledge of risk factors and measures for prevention of condition will improve Outcome: Progressing   Problem: Respiratory: Goal: Complications related to the disease process, condition or treatment will be avoided or minimized Outcome: Progressing   Problem: Skin Integrity: Goal: Risk for impaired skin integrity will decrease Outcome: Progressing

## 2022-08-21 NOTE — Progress Notes (Signed)
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  His wife states that she normally understands what he is saying but last night she cannot understand what he was saying and he was mumbling.  He wanted something but they cannot figure it out and he did not eat much of his dinner and he was not helped at all with them moving him.  Usually he is a two-person assist but wife feels that he is at worsening weakness of his left side.  Right-sided he is remains weak from his hemiparesis and hemiplegia from his prior CVA.  On the morning of admission he is still not helping him with his transfers from the bed to the wheelchair and he had a only ate about half a waffle.  Patient would not also brush his teeth and was becoming very lethargic and could not keep his eyes open and had 1 episode of diarrhea denies before.  He is brought into the ED for further evaluation and was found to be COVID-positive.  Chest x-ray showed small layering left-sided pleural effusion with mild streaky left basilar opacity which related to the atelectasis.  Head CT showed a lacunar infarct within the left thalamus which was new from his prior head CT of 09/23/2019 but was age-indeterminate.  MRI of the brain was done and showed no acute intracranial processes and no evidence of acute or subacute CVA.  Currently we are evaluating him and PT OT has now recommended SNF and he will undergo a MBS in the morning.  Assessment and Plan: * Acute encephalopathy with some  dysarthria worse than baseline as well as some left-sided weakness worse than baseline -The patient is a 89 year old male presenting with 2 day onset worsening lethargy, weakness, worsening dysarthria from baseline and confusion  -obs to telemetry instead inpatient -found to be covid positive which could be driving factor of everything  -CT head with lacunar infarct within the left thalamus, new from prior head CT on 09/2019,but otherwise age indeterminate.  -MRI brain showed no acute intracranial findings and no evidence of acute or subacute CVA  -neuro checks -NPO Until more alert-family denies any aspiration symptoms, but will have aspiration precautions and SLP to see.  Now SLP is recommending dysphagia 3 diet and MBS in the morning -ammonia wnl, TSH/B12 as below -Bc pending, but no obvious bacterial source -UA negative and urine culture currently pending -May consider getting an EEG -See below and may need to discuss with neurology further  COVID-19 virus infection -He has had no respiratory symptoms or fever at home and only symptoms has been lethargy -Discussed if less than 5 days of symptoms we could do paxolovid, but since we can't verify exact onset of symptoms will continue supportive care a this time -PPE/airborne and contact precautions -Check inflammatory markers and repeat chest x-ray in the a.m. -no respiratory complains/symptoms currently -supportive care -gentle IVF as below -PT/OT recommending SNF  Generalized Weakness and Physical Debility worse than baseline -Likely secondary to covid infection  -PT/OT/SLP and they are recommending SNF -If  does not return to baseline will need rehab/SNF -Check TSH and B12; They were 0.937 and 83 respectively; Suppplement B12  -Gentle, time limited IVF resumed -Fall precautions   THROMBOCYTOPENIA -Chronic thrombocytopenia dating back to 2012 -Slightly worse in setting of viral infection -Platelet Count Trend: Recent Labs  Lab  08/20/22 1328 08/21/22 1043  PLT 110* 93*  -Continue to Monitor and Repeat CBC in the AM    Hemiplegia and hemiparesis following cerebral infarction affecting right dominant side -At baseline -PT/OT recommending SNF  Coronary artery disease involving native coronary artery of native heart without angina pectoris -S/p CABG -Stable -Continue  medical management with zetia, ASA and beta blocker -Statin intolerant   S/P AVR (aortic valve replacement) -Echo 08/2019: Bioprosthetic aortic valve. No significant stenosis or regurgitation  noted.   Dyslipidemia -Statin intolerant, continue zetia -Lipid panel done and showed a total cholesterol/HDL ratio 4.0, cholesterol level 130, HDL 33, LDL of 88, triglycerides of 57, VLDL 11  Essential hypertension -BP soft, hold atenolol for now  -Allow for permissive HTN until acute CVA ruled out -IV parameters placed PRN   COPD (chronic obstructive pulmonary disease) -No s/sx of exacerbation -No rescue inhaler or maintenance inhaler  SpO2: 99 % -Chest x-ray in the a.m.  GERD -Continue protonix >IV while NPO is on a dysphagia 3 diet and getting an MBS in the morning  Depression, major, single episode, moderate -Continue lexapro    Hypoalbuminemia -Patient's Albumin Trend: Recent Labs  Lab 08/20/22 1222 08/21/22 1043  ALBUMIN 2.9* 2.8*  -Continue to Monitor and Trend and repeat CMP in the AM   DVT prophylaxis: SCDs Start: 08/20/22 1615    Code Status: DNR Family Communication: Discussed with family at bedside  Disposition Plan:  Level of care: Telemetry Medical Status is: Observation The patient will require care spanning > 2 midnights and should be moved to inpatient because: Further clinical improvement and a safe discharge disposition   Consultants:  My colleague and partner discussed with neuro  Procedures:  As delineated as above  Antimicrobials:  Anti-infectives (From admission, onward)    None        Subjective: Seen and examined at bedside and everything she is much more awake and alert but still mumbling and dysarthric.  Still feel that he continues to be weaker on his left side.  He denies any complaints and does answer some questions appropriately.  No other concerns or complaints at this time.  Objective: Vitals:   08/21/22 0746 08/21/22 0900 08/21/22 1300 08/21/22 1500  BP: (!) 112/58 120/61 118/60 (!) 140/70  Pulse: 66 64 66 72  Resp: 18 18    Temp: 98.8 F (37.1 C) 98.7 F (37.1 C) 98.8 F (37.1 C) 98 F (36.7 C)  TempSrc: Oral Oral Oral Oral  SpO2: 96% 98% 97% 99%  Weight:      Height:        Intake/Output Summary (Last 24 hours) at 08/21/2022 1608 Last data filed at 08/21/2022 [id number] Gross per 24 hour  Intake 1072.7 ml  Output 360 ml  Net 712.7 ml   Filed Weights   08/20/22 1700  Weight: 81.2 kg   Examination: Physical Exam:  Constitutional: WN/WD overweight elderly Caucasian male sitting in chair bedside no acute distress Respiratory: Diminished to auscultation bilaterally, no wheezing, rales, rhonchi or crackles. Normal respiratory effort and patient is not tachypenic. No accessory muscle use.  Unlabored breathing Cardiovascular: RRR, no murmurs / rubs / gallops. S1 and S2 auscultated. No extremity edema  Abdomen: Soft, non-tender, distended secondary to body habitus bowel sounds positive.  GU: Deferred. Musculoskeletal: No clubbing / cyanosis of digits/nails. 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 No induration; Warm and dry.  Neurologic: Has some dysarthria and is mumbling some of his words.  Does answer some questions and appears to be awake and alert Psychiatric: Impaired judgment and insight.  Data Reviewed: I have personally reviewed following labs and imaging studies  CBC: Recent Labs  Lab 08/20/22 1234 08/20/22 1328 08/21/22 1043  WBC  --  5.9  6.0  NEUTROABS  --  4.5 3.3  HGB 13.9 14.0 14.2  HCT 41.0 41.2 43.2  MCV  --  92.8 94.3  PLT  --  110* 93*   Basic Metabolic Panel: Recent Labs  Lab 08/20/22 1222 08/20/22 1234 08/21/22 1043  NA 133* 135 136  K 4.1 4.3 4.3  CL 101  --  104  CO2 22  --  23  GLUCOSE 123*  --  80  BUN 14  --  15  CREATININE 1.23  --  1.20  CALCIUM 8.3*  --  8.4*  MG  --   --  2.0  PHOS  --   --  3.2   GFR: Estimated Creatinine Clearance: 36.3 mL/min (by C-G formula based on SCr of 1.2 mg/dL). Liver Function Tests: Recent Labs  Lab 08/20/22 1222 08/21/22 1043  AST 18 26  ALT 10 9  ALKPHOS 54 45  BILITOT 0.6 0.9  PROT 6.2* 5.9*  ALBUMIN 2.9* 2.8*   No results for input(s): "LIPASE", "AMYLASE" in the last 168 hours. Recent Labs  Lab 08/20/22 1222  AMMONIA 32   Coagulation Profile: No results for input(s): "INR", "PROTIME" in the last 168 hours. Cardiac Enzymes: No results for input(s): "CKTOTAL", "CKMB", "CKMBINDEX", "TROPONINI" in the last 168 hours. BNP (last 3 results) No results for input(s): "PROBNP" in the last 8760 hours. HbA1C: No results for input(s): "HGBA1C" in the last 72 hours. CBG: Recent Labs  Lab 08/20/22 1333  GLUCAP 147*   Lipid Profile: Recent Labs    08/21/22 1043  CHOL 132  HDL 33*  LDLCALC 88  TRIG 57  CHOLHDL 4.0   Thyroid Function Tests: Recent Labs    08/20/22 1700  TSH 0.937   Anemia Panel: Recent Labs    08/20/22 1700  VITAMINB12 89*   Sepsis Labs: Recent Labs  Lab 08/20/22 1222  LATICACIDVEN 1.3    Recent Results (from the past 240 hour(s))  Blood culture (routine x 2)     Status: None (Preliminary result)   Collection Time: 08/20/22 12:22 PM   Specimen: BLOOD  Result Value Ref Range Status   Specimen Description BLOOD SITE NOT SPECIFIED  Final   Special Requests   Final    BOTTLES DRAWN AEROBIC AND ANAEROBIC Blood Culture results may not be optimal due to an inadequate volume of blood received in culture bottles    Culture   Final    NO GROWTH < 24 HOURS Performed at [facility], [address], [place], [place] [address]    Report Status PENDING  Incomplete  Blood culture (routine x 2)     Status: None (Preliminary result)   Collection Time: 08/20/22 12:30 PM   Specimen: BLOOD RIGHT ARM  Result Value Ref Range Status   Specimen Description BLOOD RIGHT ARM  Final   Special Requests  Final    BOTTLES DRAWN AEROBIC AND ANAEROBIC Blood Culture adequate volume   Culture   Final    NO GROWTH < 24 HOURS Performed at [facility], [address], [place], [place] [address]    Report Status PENDING  Incomplete  Resp panel by RT-PCR (RSV, Flu A&B, Covid) Anterior Nasal Swab     Status: Abnormal   Collection Time: 08/20/22  1:24 PM   Specimen: Anterior Nasal Swab  Result Value Ref Range Status   SARS Coronavirus 2 by RT PCR POSITIVE (A) NEGATIVE Final   Influenza A by PCR NEGATIVE NEGATIVE Final   Influenza B by PCR NEGATIVE NEGATIVE Final    Comment: (NOTE) The Xpert Xpress SARS-CoV-2/FLU/RSV plus assay is intended as an aid in the diagnosis of influenza from Nasopharyngeal swab specimens and should not be used as a sole basis for treatment. Nasal washings and aspirates are unacceptable for Xpert Xpress SARS-CoV-2/FLU/RSV testing.  Fact Sheet for Patients: BloggerCourse.com  Fact Sheet for Healthcare Providers: SeriousBroker.it  This test is not yet approved or cleared by the [place] FDA and has been authorized for detection and/or diagnosis of SARS-CoV-2 by FDA under an Emergency Use Authorization (EUA). This EUA will remain in effect (meaning this test can be used) for the duration of the COVID-19 declaration under Section 564(b)(1) of the Act, 21 U.S.C. section 360bbb-3(b)(1), unless the authorization is terminated or revoked.     Resp Syncytial Virus by PCR NEGATIVE NEGATIVE Final    Comment: (NOTE) Fact Sheet for  Patients: BloggerCourse.com  Fact Sheet for Healthcare Providers: SeriousBroker.it  This test is not yet approved or cleared by the [place] FDA and has been authorized for detection and/or diagnosis of SARS-CoV-2 by FDA under an Emergency Use Authorization (EUA). This EUA will remain in effect (meaning this test can be used) for the duration of the COVID-19 declaration under Section 564(b)(1) of the Act, 21 U.S.C. section 360bbb-3(b)(1), unless the authorization is terminated or revoked.  Performed at [facility], [address] [place], [place] [address]     Radiology Studies: MR BRAIN WO CONTRAST  Result Date: 08/21/2022 CLINICAL DATA:  Stroke suspected EXAM: MRI HEAD WITHOUT CONTRAST TECHNIQUE: Multiplanar, multiecho pulse sequences of the brain and surrounding structures were obtained without intravenous contrast. COMPARISON:  09/05/2019 MRI head, correlation is also made with 08/20/2022 CT head FINDINGS: Brain: No restricted diffusion to suggest acute or subacute infarct. No acute hemorrhage, mass, mass effect, or midline shift. No hydrocephalus or extra-axial collection. Encephalomalacia and hemosiderin deposition along the left lentiform nucleus and external capsule, consistent with prior parenchymal hemorrhage. Hemosiderin deposition is also noted in occipital horns and associated with a right posterior lentiform nucleus lacunar infarct. Confluent T2 hyperintense signal in the periventricular white matter, likely the sequela of moderate chronic small vessel ischemic disease. Vascular: Normal arterial flow voids. Skull and upper cervical spine: Normal marrow signal. Sinuses/Orbits: Clear paranasal sinuses. Status post bilateral lens replacements. Other: The mastoids are well aerated. IMPRESSION: No acute intracranial process. No evidence of acute or subacute infarct. Electronically Signed   By: Wiliam Ke M.D.   On:  08/21/2022 02:15   CT HEAD WO CONTRAST  Result Date: 08/20/2022 CLINICAL DATA:  Mental status change, persistent or worsening. EXAM: CT HEAD WITHOUT CONTRAST TECHNIQUE: Contiguous axial images were obtained from the base of the skull through the vertex without intravenous contrast. RADIATION DOSE REDUCTION: This exam was performed according to the departmental dose-optimization program which includes  automated exposure control, adjustment of the mA and/or kV according to patient size and/or use of iterative reconstruction technique. COMPARISON:  Prior head CT examinations 09/23/2019 FINDINGS: Brain: Moderate generalized cerebral atrophy. Cavity centered within the posterior limb of left internal capsule at site of prior parenchymal hemorrhage. Chronic small-vessel infarcts within bilateral basal ganglia. Lacunar infarct within the left thalamus, new from the prior head CT of 09/23/2019 but otherwise age indeterminate. Background moderate patchy and ill-defined hypoattenuation within the cerebral white matter, nonspecific but compatible with chronic small vessel ischemic disease. There is no acute intracranial hemorrhage. No extra-axial fluid collection. No evidence of an intracranial mass. No midline shift. Vascular: No hyperdense vessel.  Atherosclerotic calcifications. Skull: No fracture or aggressive osseous lesion. Sinuses/Orbits: No mass or acute finding within the imaged orbits. No significant paranasal sinus disease at the imaged levels. IMPRESSION: 1. Lacunar infarct within the left thalamus, new from the prior head CT of 09/23/2019 but otherwise age indeterminate. Consider a brain MRI for further evaluation. 2. Cavity centered within the posterior limb of left internal capsule at site of prior parenchymal hemorrhage. 3. Background parenchymal atrophy, chronic small vessel ischemic disease and chronic small-vessel infarcts as described. Electronically Signed   By: Jackey Loge D.O.   On: 08/20/2022 13:17    DG Chest Port 1 View  Result Date: 08/20/2022 CLINICAL DATA:  Altered mental status EXAM: PORTABLE CHEST 1 VIEW COMPARISON:  09/28/2020 FINDINGS: Stable heart size status post sternotomy and CABG. Prior cardiac valve replacement. Aortic atherosclerosis. Low lung volumes. Small layering left-sided pleural effusion. Mild streaky left basilar opacity. No pneumothorax. IMPRESSION: Small layering left-sided pleural effusion with mild streaky left basilar opacity, likely atelectasis. Electronically Signed   By: Duanne Guess D.O.   On: 08/20/2022 12:49     Scheduled Meds:  mouth rinse  15 mL Mouth Rinse 4 times per day   pantoprazole (PROTONIX) IV  40 mg Intravenous Q24H   Continuous Infusions:  sodium chloride      LOS: 0 days   Marguerita Merles, DO [facility] Available via Epic secure chat 7am-7pm After these hours, please refer to coverage provider listed on [email or web address] 08/21/2022, 4:08 PM

## 2022-08-22 ENCOUNTER — Inpatient Hospital Stay (HOSPITAL_COMMUNITY): Payer: No Typology Code available for payment source

## 2022-08-22 ENCOUNTER — Encounter (HOSPITAL_COMMUNITY): Payer: Self-pay | Admitting: Family Medicine

## 2022-08-22 DIAGNOSIS — G9340 Encephalopathy, unspecified: Secondary | ICD-10-CM | POA: Diagnosis not present

## 2022-08-22 DIAGNOSIS — U071 COVID-19: Secondary | ICD-10-CM | POA: Diagnosis not present

## 2022-08-22 DIAGNOSIS — D696 Thrombocytopenia, unspecified: Secondary | ICD-10-CM | POA: Diagnosis not present

## 2022-08-22 DIAGNOSIS — R531 Weakness: Secondary | ICD-10-CM | POA: Diagnosis not present

## 2022-08-22 LAB — TROPONIN I (HIGH SENSITIVITY)
Troponin I (High Sensitivity): 15 ng/L (ref <18)
Troponin I (High Sensitivity): 14 ng/L (ref <18)

## 2022-08-22 LAB — C-REACTIVE PROTEIN: CRP: 5.7 mg/dL — ABNORMAL HIGH (ref <1.0)

## 2022-08-22 LAB — SEDIMENTATION RATE: Sed Rate: 7 mm/hr (ref 0–16)

## 2022-08-22 LAB — URINE CULTURE: Culture: >=100000 — AB

## 2022-08-22 LAB — D-DIMER, QUANTITATIVE: D-Dimer, Quant: 1.63 ug/mL-FEU — ABNORMAL HIGH (ref 0.00–0.50)

## 2022-08-22 LAB — FIBRINOGEN
Fibrinogen: 465 mg/dL (ref 210–475)
Fibrinogen: 473 mg/dL (ref 210–475)

## 2022-08-22 LAB — LACTATE DEHYDROGENASE: LDH: 166 U/L (ref 98–192)

## 2022-08-22 LAB — FERRITIN
Ferritin: 376 ng/mL — ABNORMAL HIGH (ref 24–336)
Ferritin: 386 ng/mL — ABNORMAL HIGH (ref 24–336)

## 2022-08-22 MED ORDER — FOLIC ACID 1 MG PO TABS
1.0000 mg | ORAL_TABLET | Freq: Every day | ORAL | Status: DC
  Administered 2022-08-22 – 2022-08-27 (×6): 1 mg via ORAL
  Filled 2022-08-22 (×6): qty 1

## 2022-08-22 MED ORDER — ASPIRIN 81 MG PO CHEW
81.0000 mg | CHEWABLE_TABLET | Freq: Every day | ORAL | Status: DC
  Administered 2022-08-22 – 2022-08-27 (×6): 81 mg via ORAL
  Filled 2022-08-22 (×6): qty 1

## 2022-08-22 MED ORDER — PANTOPRAZOLE SODIUM 40 MG IV SOLR
40.0000 mg | Freq: Every day | INTRAVENOUS | Status: DC
  Administered 2022-08-23: 40 mg via INTRAVENOUS
  Filled 2022-08-22: qty 10

## 2022-08-22 MED ORDER — PANTOPRAZOLE SODIUM 40 MG PO TBEC
40.0000 mg | DELAYED_RELEASE_TABLET | Freq: Every day | ORAL | Status: DC
  Administered 2022-08-22 – 2022-08-27 (×5): 40 mg via ORAL
  Filled 2022-08-22 (×2): qty 1
  Filled 2022-08-22 (×2): qty 2
  Filled 2022-08-22 (×2): qty 1

## 2022-08-22 MED ORDER — ASPIRIN 81 MG PO TBEC: 81.0000 mg | DELAYED_RELEASE_TABLET | Freq: Every day | ORAL | Status: DC

## 2022-08-22 MED ORDER — METHYLPREDNISOLONE SODIUM SUCC 40 MG IJ SOLR
40.0000 mg | Freq: Two times a day (BID) | INTRAMUSCULAR | Status: DC
  Administered 2022-08-22 – 2022-08-24 (×5): 40 mg via INTRAVENOUS
  Filled 2022-08-22 (×5): qty 1

## 2022-08-22 MED ORDER — ADULT MULTIVITAMIN W/MINERALS CH
1.0000 | ORAL_TABLET | Freq: Every day | ORAL | Status: DC
  Administered 2022-08-22 – 2022-08-27 (×6): 1 via ORAL
  Filled 2022-08-22 (×6): qty 1

## 2022-08-22 MED ORDER — PREDNISONE 50 MG PO TABS: 50.0000 mg | ORAL_TABLET | Freq: Every day | ORAL | Status: DC

## 2022-08-22 MED ORDER — THIAMINE MONONITRATE 100 MG PO TABS
100.0000 mg | ORAL_TABLET | Freq: Every day | ORAL | Status: DC
  Administered 2022-08-22 – 2022-08-27 (×6): 100 mg via ORAL
  Filled 2022-08-22 (×6): qty 1

## 2022-08-22 MED ORDER — ESCITALOPRAM OXALATE 10 MG PO TABS
10.0000 mg | ORAL_TABLET | Freq: Every day | ORAL | Status: DC
  Administered 2022-08-22 – 2022-08-27 (×6): 10 mg via ORAL
  Filled 2022-08-22 (×6): qty 1

## 2022-08-22 NOTE — Progress Notes (Signed)
Got a call from CCMD, Patient had ST elevation>=54mV in lead v MCL, informed to Dr. Lenna Gilford for EKG, done and placed in chart, Dr. aware

## 2022-08-22 NOTE — Progress Notes (Signed)
PROGRESS NOTE    David [name]  UVO:[id number] DOB: Sep 29, 1923 DOA: 08/20/2022 PCP: Tresa Garter, MD   Brief Narrative:  The patient is a 89 year old Caucasian male with past medical history significant for but not limited to CAD status post CABG, COPD, GERD, hypertension, hyperlipidemia, aortic stenosis status post AVR, history of CVA with residual right-sided deficits and dysarthria as well as depression and other comorbidities presented to the ED with complaints of speech difficulty which is worse than baseline.  His wife states that she normally understands what he is saying but last night she cannot understand what he was saying and he was mumbling.  He wanted something but they cannot figure it out and he did not eat much of his dinner and he was not helped at all with them moving him.  Usually he is a two-person assist but wife feels that he is at worsening weakness of his left side.  Right-sided he is remains weak from his hemiparesis and hemiplegia from his prior CVA.  On the morning of admission he is still not helping him with his transfers from the bed to the wheelchair and he had a only ate about half a waffle.  Patient would not also brush his teeth and was becoming very lethargic and could not keep his eyes open and had 1 episode of diarrhea denies before.  He is brought into the ED for further evaluation and was found to be COVID-positive.  Chest x-ray showed small layering left-sided pleural effusion with mild streaky left basilar opacity which related to the atelectasis.  Head CT showed a lacunar infarct within the left thalamus which was new from his prior head CT of 09/23/2019 but was age-indeterminate.  MRI of the brain was done and showed no acute intracranial processes and no evidence of acute or subacute CVA.   Currently we are evaluating him and PT OT has now recommended SNF and he want MBS and they are recommending a regular diet with thin liquids.  Given that his  inflammatory markers are elevated we have initiated IV steroids.  Will need to check with social work.  Given that family is agreeable to go to SNF will need to check with the social worker about isolation for SNF.  Assessment and Plan: * Acute Encephalopathy with some Dysarthria worse than baseline as well as some left-sided weakness worse than baseline, improving slowly  -The patient is a 89 year old male presenting with 2 day onset worsening lethargy, weakness, worsening dysarthria from baseline and confusion  -obs to telemetry instead inpatient -found to be covid positive which could be driving factor of everything  -CT head with lacunar infarct within the left thalamus, new from prior head CT on 09/2019,but otherwise age indeterminate.  -MRI brain showed no acute intracranial findings and no evidence of acute or subacute CVA  -neuro checks -MBS done and now on a Regular Diet Thin Liquids -ammonia wnl, TSH/B12 as below -Blood Cx pending and showing NGTD, but no obvious bacterial source -UA negative and urine culture currently pending -May consider getting an EEG -See below and may need to discuss with neurology further -Family thinks he is slowly improving but he remains weak and they are agreeable for SNF   COVID-19 virus infection -He has had no respiratory symptoms or fever at home and only symptoms has been lethargy -Discussed if less than 5 days of symptoms we could do paxolovid, but since we can't verify exact onset of symptoms will continue supportive  care a this time -PPE/airborne and contact precautions -Check inflammatory markers and repeat chest x-ray in the a.m. COVID-19 Labs  Recent Labs    08/22/22 0456  DDIMER 1.63*  FERRITIN 376*  LDH 166  CRP 5.7*    Lab Results  Component Value Date   SARSCOV2NAA POSITIVE (A) 08/20/2022   SARSCOV2NAA NEGATIVE 10/06/2019   SARSCOV2NAA NEGATIVE 09/04/2019  -Initiated IV steroids given that his inflammatory markers are  elevated -No respiratory complains/symptoms currently but family states he is coughing some now -supportive care -gentle IVF as below now stopped  -PT/OT recommending SNF   Generalized Weakness and Physical Debility worse than baseline -Likely secondary to covid infection but is improving  -PT/OT/SLP and they are recommending SNF -If does not return to baseline will need rehab/SNF -Check TSH and B12; They were 0.937 and 83 respectively; Suppplement B12  -Gentle, time limited IVF resumed -Fall precautions    THROMBOCYTOPENIA -Chronic thrombocytopenia dating back to 2012 -Slightly worse in setting of viral infection -Platelet Count Trend: Recent Labs  Lab 08/20/22 1328 08/21/22 1043  PLT 110* 93*  -Continue to Monitor and Repeat CBC in the AM     Hemiplegia and hemiparesis following cerebral infarction affecting right dominant side -At baseline -PT/OT recommending SNF  ?ST Elevation -Had ST Elevation V5 on Telemetry -Troponin Negative -EKG shows no ST Elevation   Coronary artery disease involving native coronary artery of native heart without angina pectoris -S/p CABG -Stable -Troponin Negative  -Continue  medical management with zetia, ASA and beta blocker -Statin intolerant    S/P AVR (aortic valve replacement) -Echo 08/2019: Bioprosthetic aortic valve. No significant stenosis or regurgitation  noted.    Dyslipidemia -Statin intolerant, continue zetia -Lipid panel done and showed a total cholesterol/HDL ratio 4.0, cholesterol level 130, HDL 33, LDL of 88, triglycerides of 57, VLDL 11   Essential Hypertension -BP soft, hold atenolol for now  -Allow for permissive HTN until acute CVA ruled out -IV parameters placed PRN    COPD (chronic obstructive pulmonary disease) -No s/sx of exacerbation -No rescue inhaler or maintenance inhaler  SpO2: 98 % -Chest x-ray in the a.m.   GERD -Continue protonix >IV while NPO is on a dysphagia 3 diet and getting an MBS in the  morning -MBS done and recommending a Regular Diet with Thin Liquids   Depression, major, single episode, moderate -Continue lexapro     Hypoalbuminemia -Patient's Albumin Trend: Recent Labs  Lab 08/20/22 1222 08/21/22 1043  ALBUMIN 2.9* 2.8*  -Continue to Monitor and Trend and repeat CMP in the AM  DVT prophylaxis: SCDs Start: 08/20/22 1615    Code Status: DNR Family Communication: Discussed with family at bedside  Disposition Plan:  Level of care: Telemetry Medical Status is: Inpatient Remains inpatient appropriate because: needs SNF and has COVID which we have started Steroids for   Consultants:  None  Procedures:  As delineated as above  Antimicrobials:  Anti-infectives (From admission, onward)    None       Subjective: Seen and examined at bedside and family thinks he is doing a little bit better but he still slurring some of his words and is incomprehensible at times.  Continues to be weak and family is agreeable to SNF.  No nausea or vomiting.  Patient eating his lunch without issues.  No other concerns or Complaints at this time.  Objective: Vitals:   08/22/22 0012 08/22/22 0407 08/22/22 0740 08/22/22 1122  BP: 132/70 (!) 117/56 133/73 120/61  Pulse: 72  81 81 86  Resp: Temp: 98.6 F (37 C) 97.7 F (36.5 C) 98.6 F (37 C) 98.5 F (36.9 C)  TempSrc:  Oral Oral Oral  SpO2: 95% 96% 96% 98%  Weight:      Height:        Intake/Output Summary (Last 24 hours) at 08/22/2022 1815 Last data filed at 08/22/2022 0415 Gross per 24 hour  Intake 135.98 ml  Output 500 ml  Net -364.02 ml   Filed Weights   08/20/22 1700  Weight: 81.2 kg   Examination: Physical Exam:  Constitutional: WN/WD overweight elderly Caucasian male sitting up in the bed eating Respiratory: Diminished to auscultation bilaterally with some coarse breath sounds, no wheezing, rales, rhonchi or crackles. Normal respiratory effort and patient is not tachypenic. No accessory  muscle use.  Unlabored breathing Cardiovascular: RRR, no murmurs / rubs / gallops. S1 and S2 auscultated. No extremity edema.  Abdomen: Soft, non-tender, distended secondary to body habitus bowel sounds positive.  GU: Deferred. Musculoskeletal: No clubbing / cyanosis of digits/nails. No joint deformity upper and lower extremities. Skin: No rashes, lesions, ulcers on limited skin evaluation. No induration; Warm and dry.  Neurologic: He has some dysarthria and continues to mumble some words but not as bad today. Continues to be weak and has some hemiparesis and hemiplegia on the right Psychiatric: Impaired judgment and insight but he is awake and alert  Data Reviewed: I have personally reviewed following labs and imaging studies  CBC: Recent Labs  Lab 08/20/22 1234 08/20/22 1328 08/21/22 1043  WBC  --  5.9 6.0  NEUTROABS  --  4.5 3.3  HGB 13.9 14.0 14.2  HCT 41.0 41.2 43.2  MCV  --  92.8 94.3  PLT  --  110* 93*   Basic Metabolic Panel: Recent Labs  Lab 08/20/22 1222 08/20/22 1234 08/21/22 1043  NA 133* 135 136  K 4.1 4.3 4.3  CL 101  --  104  CO2 22  --  23  GLUCOSE 123*  --  80  BUN 14  --  15  CREATININE 1.23  --  1.20  CALCIUM 8.3*  --  8.4*  MG  --   --  2.0  PHOS  --   --  3.2   GFR: Estimated Creatinine Clearance: 36.3 mL/min (by C-G formula based on SCr of 1.2 mg/dL). Liver Function Tests: Recent Labs  Lab 08/20/22 1222 08/21/22 1043  AST 18 26  ALT 10 9  ALKPHOS 54 45  BILITOT 0.6 0.9  PROT 6.2* 5.9*  ALBUMIN 2.9* 2.8*   No results for input(s): "LIPASE", "AMYLASE" in the last 168 hours. Recent Labs  Lab 08/20/22 1222  AMMONIA 32   Coagulation Profile: No results for input(s): "INR", "PROTIME" in the last 168 hours. Cardiac Enzymes: No results for input(s): "CKTOTAL", "CKMB", "CKMBINDEX", "TROPONINI" in the last 168 hours. BNP (last 3 results) No results for input(s): "PROBNP" in the last 8760 hours. HbA1C: No results for input(s): "HGBA1C" in  the last 72 hours. CBG: Recent Labs  Lab 08/20/22 1333  GLUCAP 147*   Lipid Profile: Recent Labs    08/21/22 1043  CHOL 132  HDL 33*  LDLCALC 88  TRIG 57  CHOLHDL 4.0   Thyroid Function Tests: Recent Labs    08/20/22 1700  TSH 0.937   Anemia Panel: Recent Labs    08/20/22 1700 08/22/22 0456  VITAMINB12 89*  --   FERRITIN  --  376*  Sepsis Labs: Recent Labs  Lab 08/20/22 1222  LATICACIDVEN 1.3   Recent Results (from the past 240 hour(s))  Blood culture (routine x 2)     Status: None (Preliminary result)   Collection Time: 08/20/22 12:22 PM   Specimen: BLOOD  Result Value Ref Range Status   Specimen Description BLOOD SITE NOT SPECIFIED  Final   Special Requests   Final    BOTTLES DRAWN AEROBIC AND ANAEROBIC Blood Culture results may not be optimal due to an inadequate volume of blood received in culture bottles   Culture   Final    NO GROWTH 2 DAYS Performed at [facility], [address] [place], [place] [address]    Report Status PENDING  Incomplete  Blood culture (routine x 2)     Status: None (Preliminary result)   Collection Time: 08/20/22 12:30 PM   Specimen: BLOOD RIGHT ARM  Result Value Ref Range Status   Specimen Description BLOOD RIGHT ARM  Final   Special Requests   Final    BOTTLES DRAWN AEROBIC AND ANAEROBIC Blood Culture adequate volume   Culture   Final    NO GROWTH 2 DAYS Performed at [facility], [address] [place], [place] [address]    Report Status PENDING  Incomplete  Resp panel by RT-PCR (RSV, Flu A&B, Covid) Anterior Nasal Swab     Status: Abnormal   Collection Time: 08/20/22  1:24 PM   Specimen: Anterior Nasal Swab  Result Value Ref Range Status   SARS Coronavirus 2 by RT PCR POSITIVE (A) NEGATIVE Final   Influenza A by PCR NEGATIVE NEGATIVE Final   Influenza B by PCR NEGATIVE NEGATIVE Final    Comment: (NOTE) The Xpert Xpress SARS-CoV-2/FLU/RSV plus assay is intended as an aid in the diagnosis of  influenza from Nasopharyngeal swab specimens and should not be used as a sole basis for treatment. Nasal washings and aspirates are unacceptable for Xpert Xpress SARS-CoV-2/FLU/RSV testing.  Fact Sheet for Patients: BloggerCourse.comhttps://www.fda.gov/media/152166/download  Fact Sheet for Healthcare Providers: SeriousBroker.ithttps://www.fda.gov/media/152162/download  This test is not yet approved or cleared by the [place] FDA and has been authorized for detection and/or diagnosis of SARS-CoV-2 by FDA under an Emergency Use Authorization (EUA). This EUA will remain in effect (meaning this test can be used) for the duration of the COVID-19 declaration under Section 564(b)(1) of the Act, 21 U.S.C. section 360bbb-3(b)(1), unless the authorization is terminated or revoked.     Resp Syncytial Virus by PCR NEGATIVE NEGATIVE Final    Comment: (NOTE) Fact Sheet for Patients: BloggerCourse.comhttps://www.fda.gov/media/152166/download  Fact Sheet for Healthcare Providers: SeriousBroker.ithttps://www.fda.gov/media/152162/download  This test is not yet approved or cleared by the [place] FDA and has been authorized for detection and/or diagnosis of SARS-CoV-2 by FDA under an Emergency Use Authorization (EUA). This EUA will remain in effect (meaning this test can be used) for the duration of the COVID-19 declaration under Section 564(b)(1) of the Act, 21 U.S.C. section 360bbb-3(b)(1), unless the authorization is terminated or revoked.  Performed at [facility], [address] [place], [place] [address]   Urine Culture (for pregnant, neutropenic or urologic patients or patients with an indwelling urinary catheter)     Status: Abnormal   Collection Time: 08/20/22  2:35 PM   Specimen: Urine, Clean Catch  Result Value Ref Range Status   Specimen Description URINE, CLEAN CATCH  Final   Special Requests   Final    ADDED 2140 Performed at [facility], [address], [place], [place] [address]    Culture (A)  Final     >=100,000 COLONIES/mL MULTIPLE SPECIES PRESENT, SUGGEST RECOLLECTION   Report Status 08/22/2022 FINAL  Final    Radiology Studies: DG Swallowing Func-Speech Pathology  Result Date: 08/22/2022 Table formatting from the original result was not included. Modified Barium Swallow Study Patient Details Name: David [name] MRN: [id number] Date of Birth: 09-Feb-1924 Today's Date: 08/22/2022 HPI/PMH: HPI: David [name] is a 89 y.o. male who presented to ED with complaints of speech difficulty worse than baseline. Found to be COVID+. Family reports decreased PO intake.  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 Hx of silent aspiraiton per MBS x2 in 2021 with CIR following CVA. Clinical Impression: Clinical Impression: Pt presents with generally functional oropharyngeal swallowing with mild changes. He has a collection of oral residue on his tongue with solids that he clears spontaneously with a second swallow. His swallow initiates at the pyriform sinuses with thin liquids, and at times he has trace penetration with thin liquids that is consistently cleared with cup sips as he goes on to achieve adequate laryngeal vestibule closure. A trace amount of thin liquids via straw did reach as far as his pyriform sinuses, and then fell below his vocal folds to become silent aspiration as he went to take his next bolus. When cued to clear his throat, this also appeared to clear. Note that pt also had retention of barium including the tablet in the distal esophagus, where his anatomy was more tortuous, although this test is not diagnostic of the esophagus. Recommend that pt remain on regular diet and thin liquids. Could consider focusing on cup sips or offering meds in puree. Factors that may increase risk of adverse event in presence of aspiration [name] & [name] 2021): Factors that may increase risk of adverse event in  presence of aspiration [name] & [name] 2021): Frail or deconditioned Recommendations/Plan: Swallowing Evaluation Recommendations Swallowing Evaluation Recommendations Recommendations: PO diet PO Diet Recommendation: Regular; Thin liquids (Level 0) Liquid Administration via: Cup Medication Administration: Crushed with puree (may want to crush larger pills) Supervision: Staff to assist with self-feeding Swallowing strategies  : Slow rate; Small bites/sips Postural changes: Stay upright 30-60 min after meals; Position pt fully upright for meals Oral care recommendations: Oral care BID (2x/day) Treatment Plan Treatment Plan Treatment recommendations: Therapy as outlined in treatment plan below Follow-up recommendations: No SLP follow up Functional status assessment: Patient has had a recent decline in their functional status and demonstrates the ability to make significant improvements in function in a reasonable and predictable amount of time. Treatment frequency: Min 1x/week Treatment duration: 1 week Interventions: Aspiration precaution training; Compensatory techniques; Patient/family education; Trials of upgraded texture/liquids Recommendations Recommendations for follow up therapy are one component of a multi-disciplinary discharge planning process, led by the attending physician.  Recommendations may be updated based on patient status, additional functional criteria and insurance authorization. Assessment: Orofacial Exam: Orofacial Exam Oral Cavity - Dentition: Adequate natural dentition Orofacial Anatomy: WFL Anatomy: Anatomy: Suspected cervical osteophytes; Other (Comment) (distal esophagus appeared to be tortuous) Boluses Administered: Boluses Administered Boluses Administered: Thin liquids (Level 0); Mildly thick liquids (Level 2, nectar thick); Moderately thick liquids (Level 3, honey thick); Puree; Solid  Oral Impairment Domain: Oral Impairment Domain Lip Closure: No labial escape Tongue control during  bolus hold: Posterior escape of greater than  half of bolus (cognitively did not follow instructions) Bolus preparation/mastication: Timely and efficient chewing and mashing Bolus transport/lingual motion: Brisk tongue motion Oral residue: Residue collection on oral structures Location of oral residue : Tongue; Palate Initiation of pharyngeal swallow : Pyriform sinuses  Pharyngeal Impairment Domain: Pharyngeal Impairment Domain Soft palate elevation: No bolus between soft palate (SP)/pharyngeal wall (PW) Laryngeal elevation: Complete superior movement of thyroid cartilage with complete approximation of arytenoids to epiglottic petiole Anterior hyoid excursion: Partial anterior movement Epiglottic movement: Complete inversion Laryngeal vestibule closure: Incomplete, narrow column air/contrast in laryngeal vestibule Pharyngeal stripping wave : Present - complete Pharyngeal contraction (A/P view only): N/A Pharyngoesophageal segment opening: Complete distension and complete duration, no obstruction of flow Tongue base retraction: No contrast between tongue base and posterior pharyngeal wall (PPW) Pharyngeal residue: Trace residue within or on pharyngeal structures Location of pharyngeal residue: Valleculae  Esophageal Impairment Domain: Esophageal Impairment Domain Esophageal clearance upright position: Esophageal retention Pill: Esophageal Impairment Domain Esophageal clearance upright position: Esophageal retention Penetration/Aspiration Scale Score: Penetration/Aspiration Scale Score 1.  Material does not enter airway: Puree; Solid; Pill; Moderately thick liquids (Level 3, honey thick) 2.  Material enters airway, remains ABOVE vocal cords then ejected out: Mildly thick liquids (Level 2, nectar thick) 8.  Material enters airway, passes BELOW cords without attempt by patient to eject out (silent aspiration) : Thin liquids (Level 0) Compensatory Strategies: Compensatory Strategies Compensatory strategies: Yes Straw:  Ineffective Ineffective Straw: Thin liquid (Level 0)   General Information: Caregiver present: No  Diet Prior to this Study: Regular; Thin liquids (Level 0)   Temperature : Normal   Respiratory Status: WFL   Supplemental O2: None (Room air)   History of Recent Intubation: No  Behavior/Cognition: Alert; Cooperative; Pleasant mood Self-Feeding Abilities: Needs assist with self-feeding Baseline vocal quality/speech: Normal Volitional Cough: Able to elicit Volitional Swallow: Able to elicit No data recorded Goal Planning: Prognosis for improved oropharyngeal function: Fair Barriers to Reach Goals: Time post onset No data recorded Patient/Family Stated Goal: would like MBS Consulted and agree with results and recommendations: Patient; Nurse; Physician Pain: Pain Assessment Pain Assessment: Faces Faces Pain Scale: 0 Pain Intervention(s): Monitored during session; Repositioned End of Session: Start Time:SLP Start Time (ACUTE ONLY): 1520 Stop Time: SLP Stop Time (ACUTE ONLY): 1557 Time Calculation:SLP Time Calculation (min) (ACUTE ONLY): 37 min Charges: SLP Evaluations $ SLP Speech Visit: 1 Visit SLP Evaluations $BSS Swallow: 1 Procedure $MBS Swallow: 1 Procedure SLP visit diagnosis: SLP Visit Diagnosis: Dysphagia, oropharyngeal phase (R13.12) Past Medical History: Past Medical History: Diagnosis Date  CAD (coronary artery disease)   intolerance of statins  COPD (chronic obstructive pulmonary disease)   Current use of long term anticoagulation   GERD (gastroesophageal reflux disease)   Hyperlipidemia   Hypertension   Impaired glucose tolerance 02/14/2011  LBP (low back pain)   OA (osteoarthritis)   S/P AVR (aortic valve replacement) 2009  bonine valve Past Surgical History: Past Surgical History: Procedure Laterality Date  AORTIC VALVE REPLACEMENT  01/15/1999  bovine  CARDIAC CATHETERIZATION  10/05/1998  Recommend CABG revascularizaition  CARDIAC CATHETERIZATION  06/11/1999  Continue medical therapy  CARDIAC CATHETERIZATION   01/08/2007  Patent grafts, recommend medical therapy  CARDIOVASCULAR STRESS TEST  04/22/2010  Small lateral defect which is partially reversible, no ischemic EKG changes were noted.  CAROTID DOPPLER  04/22/2010  Bilateral ICAs-no evidence of diametere reduction, significant tortuosity, or any other vascular abnormality.  CORONARY ARTERY BYPASS GRAFT  10/09/1998  LIMA to LAD, SVG to diag, SVG  sequential to 2 marginals, SVG sequential to PDA & PLA. CE#25 porcine AVR.  TRANSTHORACIC ECHOCARDIOGRAM  05/14/2012  EF 50-55%, bioprosthesis of the aortic valve with well preserved LV function, moderate concentric LVGH Mahala Menghini., M.A. CCC-SLP Acute Rehabilitation Services Office [phone] Secure chat preferred 08/22/2022, 5:15 PM  MR BRAIN WO CONTRAST  Result Date: 08/21/2022 CLINICAL DATA:  Stroke suspected EXAM: MRI HEAD WITHOUT CONTRAST TECHNIQUE: Multiplanar, multiecho pulse sequences of the brain and surrounding structures were obtained without intravenous contrast. COMPARISON:  09/05/2019 MRI head, correlation is also made with 08/20/2022 CT head FINDINGS: Brain: No restricted diffusion to suggest acute or subacute infarct. No acute hemorrhage, mass, mass effect, or midline shift. No hydrocephalus or extra-axial collection. Encephalomalacia and hemosiderin deposition along the left lentiform nucleus and external capsule, consistent with prior parenchymal hemorrhage. Hemosiderin deposition is also noted in occipital horns and associated with a right posterior lentiform nucleus lacunar infarct. Confluent T2 hyperintense signal in the periventricular white matter, likely the sequela of moderate chronic small vessel ischemic disease. Vascular: Normal arterial flow voids. Skull and upper cervical spine: Normal marrow signal. Sinuses/Orbits: Clear paranasal sinuses. Status post bilateral lens replacements. Other: The mastoids are well aerated. IMPRESSION: No acute intracranial process. No evidence of acute or subacute infarct.  Electronically Signed   By: Wiliam Ke M.D.   On: 08/21/2022 02:15    Scheduled Meds:  vitamin B-12  1,000 mcg Oral Daily   folic acid  1 mg Oral Daily   methylPREDNISolone (SOLU-MEDROL) injection  40 mg Intravenous BID   Followed by   Melene Muller ON 08/25/2022] predniSONE  50 mg Oral Daily   multivitamin with minerals  1 tablet Oral Daily   mouth rinse  15 mL Mouth Rinse 4 times per day   pantoprazole  40 mg Oral Daily   Or   pantoprazole (PROTONIX) IV  40 mg Intravenous Daily   thiamine  100 mg Oral Daily   Continuous Infusions:   LOS: 1 day   Marguerita Merles, DO [facility] Available via Epic secure chat 7am-7pm After these hours, please refer to coverage provider listed on [email or web address] 08/22/2022, 6:15 PM

## 2022-08-22 NOTE — Progress Notes (Signed)
Modified Barium Swallow Study  Patient Details  Name: David [name] MRN: [id number] Date of Birth: February 06, 1924  Today's Date: 08/22/2022  Modified Barium Swallow completed.  Full report located under Chart Review in the Imaging Section.  History of Present Illness David [name] is a 89 y.o. male who presented to ED with complaints of speech difficulty worse than baseline. Found to be COVID+. Family reports decreased PO intake.  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 Hx of silent aspiraiton per MBS x2 in 2021 with CIR following CVA.   Clinical Impression Pt presents with generally functional oropharyngeal swallowing with mild changes. He has a collection of oral residue on his tongue with solids that he clears spontaneously with a second swallow. His swallow initiates at the pyriform sinuses with thin liquids, and at times he has trace penetration with thin liquids that is consistently cleared with cup sips as he goes on to achieve adequate laryngeal vestibule closure. A trace amount of thin liquids via straw did reach as far as his pyriform sinuses, and then fell below his vocal folds to become silent aspiration as he went to take his next bolus. When cued to clear his throat, this also appeared to clear. Note that pt also had retention of barium including the tablet in the distal esophagus, where his anatomy was more tortuous, although this test is not diagnostic of the esophagus. Recommend that pt remain on regular diet and thin liquids. Could consider focusing on cup sips or offering meds in puree. Factors that may increase risk of adverse event in presence of aspiration [name] & [name] 2021): Frail or deconditioned  Swallow Evaluation Recommendations Recommendations: PO diet PO Diet Recommendation: Regular;Thin liquids (Level 0) Liquid Administration via: Cup Medication  Administration: Crushed with puree (may want to crush larger pills) Supervision: Staff to assist with self-feeding Swallowing strategies  : Slow rate;Small bites/sips Postural changes: Stay upright 30-60 min after meals;Position pt fully upright for meals Oral care recommendations: Oral care BID (2x/day)      Mahala Menghini., M.A. CCC-SLP Acute Rehabilitation Services Office [phone]  Secure chat preferred  08/22/2022,5:15 PM

## 2022-08-22 NOTE — Progress Notes (Signed)
Physical Therapy Treatment Patient Details Name: David [name] MRN: [id number] DOB: [birth year] Today's Date: 08/22/2022   History of Present Illness David [name] is a 89 y.o. male admitted 08/20/22 with speech difficulty worse than baseline, lethargy, diarrhea. CT revealed new Lacunar infarct within the left thalamus, also found to be COVID +. 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    PT Comments    Pt agreeable to participate and making good progress towards his physical therapy goals. Session focused on bed level exercises and transfer training using Stedy (pt uses sit to stand lift at home). Pt able to perform multiple sit to stands with lift equipment throughout session. Pt family determining level of caregiver support available at home.     Recommendations for follow up therapy are one component of a multi-disciplinary discharge planning process, led by the attending physician.  Recommendations may be updated based on patient status, additional functional criteria and insurance authorization.  Follow Up Recommendations  Can patient physically be transported by private vehicle: No    Assistance Recommended at Discharge Frequent or constant Supervision/Assistance  Patient can return home with the following Two people to help with walking and/or transfers;A lot of help with bathing/dressing/bathroom   Equipment Recommendations  None recommended by PT (pt well equipped)    Recommendations for Other Services       Precautions / Restrictions Precautions Precautions: Fall Precaution Comments: R hemi at baseline Required Braces or Orthoses: Other Brace (ankle brace and shoes to be worn for transfers) Other Brace: [facility] from [facility] Restrictions Weight Bearing Restrictions: No     Mobility  Bed Mobility Overal bed mobility: Needs Assistance Bed Mobility: Supine to Sit     Supine to  sit: Mod assist, +2 for physical assistance, +2 for safety/equipment     General bed mobility comments: Pt good at following cues, assist for RLE and trunk elevation    Transfers Overall transfer level: Needs assistance Equipment used: Ambulation equipment used Transfers: Sit to/from Stand Sit to Stand: Min assist, +2 safety/equipment, From elevated surface, Min guard           General transfer comment: stedy used to simulate Pt's "sit and stand" at home, good power up through legs and good controlled descent    Ambulation/Gait                   Stairs             Wheelchair Mobility    Modified Rankin (Stroke Patients Only)       Balance Overall balance assessment: Needs assistance Sitting-balance support: Feet supported Sitting balance-Leahy Scale: Poor Sitting balance - Comments: Initially requiring minA, progressing to supervision. posterior lean                                    Cognition Arousal/Alertness: Awake/alert Behavior During Therapy: WFL for tasks assessed/performed Overall Cognitive Status: Within Functional Limits for tasks assessed                                 General Comments: sometimes hard to understand expressive aphasia, but follows directions and good safety awareness        Exercises General Exercises - Lower Extremity Heel Slides: Left, 10  reps, Supine Hip ABduction/ADduction: AAROM, Left, 10 reps, Supine Other Exercises Other Exercises: x3 sit to stands from [facility] Comments        Pertinent Vitals/Pain Pain Assessment Pain Assessment: Faces Faces Pain Scale: No hurt    Home Living                          Prior Function            PT Goals (current goals can now be found in the care plan section) Acute Rehab PT Goals Patient Stated Goal: to get stronger PT Goal Formulation: With patient Time For Goal Achievement: 09/04/22 Potential to Achieve Goals:  Fair Progress towards PT goals: Progressing toward goals    Frequency    Min 3X/week      PT Plan Current plan remains appropriate    Co-evaluation              AM-PAC PT "6 Clicks" Mobility   Outcome Measure  Help needed turning from your back to your side while in a flat bed without using bedrails?: A Little Help needed moving from lying on your back to sitting on the side of a flat bed without using bedrails?: A Lot Help needed moving to and from a bed to a chair (including a wheelchair)?: A Lot Help needed standing up from a chair using your arms (e.g., wheelchair or bedside chair)?: A Lot Help needed to walk in hospital room?: Total Help needed climbing 3-5 steps with a railing? : Total 6 Click Score: 11    End of Session Equipment Utilized During Treatment: Gait belt Activity Tolerance: Patient tolerated treatment well Patient left: in chair;with call bell/phone within reach;with chair alarm set Nurse Communication: Mobility status PT Visit Diagnosis: Muscle weakness (generalized) (M62.81);Other abnormalities of gait and mobility (R26.89)     Time: 8115-7262 PT Time Calculation (min) (ACUTE ONLY): 25 min  Charges:  $Therapeutic Activity: 23-37 mins                     Lillia Pauls, PT, DPT Acute Rehabilitation Services Office [phone]    Norval Morton 08/22/2022, 2:33 PM

## 2022-08-22 NOTE — Progress Notes (Signed)
Initial Nutrition Assessment  DOCUMENTATION CODES:   Not applicable  INTERVENTION:  Continue DYS 3 diet per SLP recommnedation Adjust to automatic trays Hot chocolate with all meals  NUTRITION DIAGNOSIS:   Inadequate oral intake related to decreased appetite as evidenced by per patient/family report.  GOAL:   Patient will meet greater than or equal to 90% of their needs  MONITOR:   PO intake, Supplement acceptance, Weight trends, Labs  REASON FOR ASSESSMENT:   Malnutrition Screening Tool    ASSESSMENT:   Pt with hx of CAD, COPD, GERD, HTN, HLD, and hx CVA presented to ED with speech difficulty and weakness. Found to be Covid19+ and imaging showed an age indeterminate lacunar infarct within the left thalamus.   Pt resting in care at the time of assessment. Daughter at bedside able to assist with nutrition hx. Reports that pt doesn't typically have a very good appetite. Will deny being hungry but will eat if food is offered to him at a meal. States that they didn't receive breakfast this AM and so he had an early lunch/late breakfast. Will adjust to automatic tray from dining services to ensure all meals are received.  Reviewed chart and 4.8% weight loss noted over the last 6 months which is not severe for timeframe. Daughter endorses a stable weight. Some muscle and fat deficits noted on exam. However, feel it is more in-line with normal aging. No concerns for malnutrition at this time.   Pt does like hot chocolate, will add to trays as a supplemental food.   Nutritionally Relevant Medications: Scheduled Meds:  vitamin B-12  1,000 mcg Oral Daily   pantoprazole IV  40 mg Intravenous Q24H   Labs Reviewed  NUTRITION - FOCUSED PHYSICAL EXAM: Flowsheet Row Most Recent Value  Orbital Region No depletion  Upper Arm Region Mild depletion  Thoracic and Lumbar Region No depletion  Buccal Region No depletion  Temple Region No depletion  Clavicle Bone Region Mild depletion   Clavicle and Acromion Bone Region Mild depletion  Scapular Bone Region No depletion  Dorsal Hand Moderate depletion  Patellar Region Mild depletion  Anterior Thigh Region Mild depletion  Posterior Calf Region Mild depletion  Edema (RD Assessment) None  Hair Reviewed  Eyes Reviewed  Mouth Reviewed  Skin Reviewed  Nails Reviewed   Diet Order:   Diet Order             DIET DYS 3 Room service appropriate? No; Fluid consistency: Thin  Diet effective now                   EDUCATION NEEDS:   Education needs have been addressed  Skin:  Skin Assessment: Reviewed RN Assessment Incontinence associated dermatitis to the buttocks  Last BM:  4/5 - type 7  Height:   Ht Readings from Last 1 Encounters:  08/20/22 5\' 10"  (1.778 m)    Weight:   Wt Readings from Last 1 Encounters:  08/20/22 81.2 kg    Ideal Body Weight:  75.5 kg  BMI:  Body mass index is 25.69 kg/m.  Estimated Nutritional Needs:   Kcal:  1700-2000 kcal/d  Protein:  85-100 g/d  Fluid:  1.8-2L/d    Greig Castilla, RD, LDN Clinical Dietitian RD pager # available in AMION  After hours/weekend pager # available in [facility]

## 2022-08-22 NOTE — Plan of Care (Signed)
  Problem: Coping: Goal: Will identify appropriate support needs Outcome: Progressing   Problem: Nutrition: Goal: Risk of aspiration will decrease Outcome: Progressing Goal: Dietary intake will improve Outcome: Progressing   Problem: Respiratory: Goal: Complications related to the disease process, condition or treatment will be avoided or minimized Outcome: Progressing

## 2022-08-22 NOTE — NC FL2 (Signed)
[place] MEDICAID FL2 LEVEL OF CARE FORM     IDENTIFICATION  Patient Name: David [name] Birthdate: 08/13/1923 Sex: male Admission Date (Current Location): 08/20/2022  [facility] and [place] Number:  Producer, television/film/video and Address:  [facility] [place], [address] [place], [place] [address]      Provider Number: [id number]  Attending Physician Name and Address:  Merlene Laughter, DO  Relative Name and Phone Number:       Current Level of Care: Hospital Recommended Level of Care: Skilled Nursing Facility Prior Approval Number:    Date Approved/Denied:   PASRR Number: Manual review  Discharge Plan: SNF    Current Diagnoses: Patient Active Problem List   Diagnosis Date Noted   Acute encephalopathy 08/20/2022   COVID-19 virus infection 08/20/2022   Weakness 08/20/2022   Intertrigo 02/10/2022   Aortic valve disorder 08/17/2021   Chronic ischemic heart disease 08/17/2021   Foot drop, right foot 08/17/2021   Hemiplegia and hemiparesis following cerebral infarction affecting right dominant side 08/17/2021   Impacted cerumen, right ear 08/17/2021   Nail dystrophy 08/17/2021   Need for assistance with personal care 08/17/2021   Other specified problems related to psychosocial circumstances 08/17/2021   Pressure ulcer of unspecified buttock, unspecified stage 08/17/2021   Atherosclerosis of aorta 09/28/2020   Adhesive capsulitis of right shoulder 08/19/2020   Situational anxiety 06/29/2020   Hyperkeratosis 01/29/2020   Polycythemia 01/03/2020   Elevated LFTs 01/03/2020   Spastic hemiparesis of right dominant side 10/30/2019   Mass of heart valve 10/17/2019   Depression, major, single episode, moderate 10/17/2019   Cognitive and neurobehavioral dysfunction    Labile blood pressure    Dysphagia, post-stroke    AKI (acute kidney injury)    Hyperglycemia    Coronary artery disease involving native coronary artery of native heart without angina  pectoris    Chronic obstructive pulmonary disease    Tremors of nervous system    S/P AVR    Thrombocytopenia    Global aphasia    ICH (intracerebral hemorrhage) 09/04/2019   Tremor 05/20/2019   Pulmonary valve vegetation 09/20/2018   Chest pain, atypical 09/20/2018   Onychomycosis 06/06/2018   Acute pain of left knee 08/18/2017   Wart of hand 06/05/2017   Acute upper respiratory infection 05/12/2016   Skin irritation from shaving 12/02/2015   Benign paroxysmal positional vertigo 03/09/2015   Plantar fasciitis, bilateral 12/02/2014   Constipation 07/17/2014   Cough 04/09/2014   S/P AVR (aortic valve replacement) 11/04/2013   Elevated hemoglobin (HCC) 12/14/2012   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   Impaired glucose tolerance 02/14/2011   Edema 10/22/2010   Actinic keratoses 10/22/2010   THROMBOCYTOPENIA 01/22/2010   MELANOMA 03/05/2009   TOBACCO USE, QUIT 02/27/2009   Neoplasm of uncertain behavior of skin 05/08/2007   Dyslipidemia 02/08/2007   Essential hypertension 02/08/2007   Coronary atherosclerosis 02/08/2007   COPD (chronic obstructive pulmonary disease) 02/08/2007   GERD 02/08/2007   Osteoarthritis 02/08/2007   LOW BACK PAIN 02/08/2007   Rash 01/29/2007    Orientation RESPIRATION BLADDER Height & Weight     Self  Normal Incontinent Weight: 179 lb 0.2 oz (81.2 kg) Height:  5\' 10"  (177.8 cm)  BEHAVIORAL SYMPTOMS/MOOD NEUROLOGICAL BOWEL NUTRITION STATUS      Incontinent Diet (see discharge summary)  AMBULATORY STATUS COMMUNICATION OF NEEDS Skin   Extensive Assist Verbally Other (Comment) (wound; incontinence associated dermititis, buttocks, left, right)  Personal Care Assistance Level of Assistance  Bathing, Feeding, Dressing Bathing Assistance: Maximum assistance Feeding assistance: Maximum assistance Dressing Assistance: Maximum assistance     Functional Limitations Info             SPECIAL CARE FACTORS FREQUENCY  PT  (By licensed PT), OT (By licensed OT)     PT Frequency: 5x/weel OT Frequency: 5x/week            Contractures Contractures Info: Not present    Additional Factors Info  Code Status, Allergies, Isolation Precautions Code Status Info: DNR Allergies Info: Atorvastatin  Colesevelam  Simvastatin  Sulfa Antibiotics     Isolation Precautions Info: Positive for Covid, Airborne Precautions, precautions end 08/30/22     Current Medications (08/22/2022):  This is the current hospital active medication list Current Facility-Administered Medications  Medication Dose Route Frequency Provider Last Rate Last Admin   acetaminophen (TYLENOL) tablet 650 mg  650 mg Oral Q6H PRN Orland Mustard, MD       Or   acetaminophen (TYLENOL) suppository 650 mg  650 mg Rectal Q6H PRN Orland Mustard, MD       cyanocobalamin (VITAMIN B12) tablet 1,000 mcg  1,000 mcg Oral Daily Marguerita Merles [place], DO   1,000 mcg at 08/22/22 6073   folic acid (FOLVITE) tablet 1 mg  1 mg Oral Daily Sheikh, Omair Latif, DO       methylPREDNISolone sodium succinate (SOLU-MEDROL) 40 mg/mL injection 40 mg  40 mg Intravenous BID Sheikh, Kateri Mc Latif, DO       Followed by   Melene Muller ON 08/25/2022] predniSONE (DELTASONE) tablet 50 mg  50 mg Oral Daily Sheikh, Omair Latif, DO       metoprolol tartrate (LOPRESSOR) injection 5 mg  5 mg Intravenous Q6H PRN Orland Mustard, MD       multivitamin with minerals tablet 1 tablet  1 tablet Oral Daily Marguerita Merles Latif, DO       Oral care mouth rinse  15 mL Mouth Rinse 4 times per day Orland Mustard, MD   15 mL at 08/22/22 7106   Oral care mouth rinse  15 mL Mouth Rinse PRN Orland Mustard, MD       pantoprazole (PROTONIX) EC tablet 40 mg  40 mg Oral Daily Marguerita Merles [place], DO   40 mg at 08/22/22 1323   Or   pantoprazole (PROTONIX) injection 40 mg  40 mg Intravenous Daily Sheikh, Omair Latif, DO       thiamine (VITAMIN B1) tablet 100 mg  100 mg Oral Daily Marguerita Merles [place], DO         Discharge  Medications: Please see discharge summary for a list of discharge medications.  Relevant Imaging Results:  Relevant Lab Results:   Additional Information [id number]  Alyxander Kollmann A [place], [place]

## 2022-08-23 ENCOUNTER — Inpatient Hospital Stay (HOSPITAL_COMMUNITY): Payer: No Typology Code available for payment source

## 2022-08-23 DIAGNOSIS — R531 Weakness: Secondary | ICD-10-CM | POA: Diagnosis not present

## 2022-08-23 DIAGNOSIS — G9340 Encephalopathy, unspecified: Secondary | ICD-10-CM | POA: Diagnosis not present

## 2022-08-23 DIAGNOSIS — F321 Major depressive disorder, single episode, moderate: Secondary | ICD-10-CM

## 2022-08-23 DIAGNOSIS — D696 Thrombocytopenia, unspecified: Secondary | ICD-10-CM | POA: Diagnosis not present

## 2022-08-23 DIAGNOSIS — U071 COVID-19: Secondary | ICD-10-CM | POA: Diagnosis not present

## 2022-08-23 LAB — COMPREHENSIVE METABOLIC PANEL
ALT: 13 U/L (ref 0–44)
AST: 22 U/L (ref 15–41)
Albumin: 2.6 g/dL — ABNORMAL LOW (ref 3.5–5.0)
Alkaline Phosphatase: 46 U/L (ref 38–126)
Anion gap: 12 (ref 5–15)
BUN: 11 mg/dL (ref 8–23)
CO2: 22 mmol/L (ref 22–32)
Calcium: 8.6 mg/dL — ABNORMAL LOW (ref 8.9–10.3)
Chloride: 98 mmol/L (ref 98–111)
Creatinine, Ser: 1 mg/dL (ref 0.61–1.24)
GFR, Estimated: >60 mL/min (ref >60)
Glucose, Bld: 159 mg/dL — ABNORMAL HIGH (ref 70–99)
Potassium: 3.8 mmol/L (ref 3.5–5.1)
Sodium: 132 mmol/L — ABNORMAL LOW (ref 135–145)
Total Bilirubin: 0.5 mg/dL (ref 0.3–1.2)
Total Protein: 6 g/dL — ABNORMAL LOW (ref 6.5–8.1)

## 2022-08-23 LAB — C-REACTIVE PROTEIN
CRP: 6 mg/dL — ABNORMAL HIGH (ref <1.0)
CRP: 4.1 mg/dL — ABNORMAL HIGH (ref <1.0)

## 2022-08-23 LAB — CBC WITH DIFFERENTIAL/PLATELET
Abs Immature Granulocytes: 0 10*3/uL (ref 0.00–0.07)
Basophils Absolute: 0 10*3/uL (ref 0.0–0.1)
Basophils Relative: 0 %
Eosinophils Absolute: 0 10*3/uL (ref 0.0–0.5)
Eosinophils Relative: 0 %
HCT: 42.3 % (ref 39.0–52.0)
Hemoglobin: 14.7 g/dL (ref 13.0–17.0)
Immature Granulocytes: 0 %
Lymphocytes Relative: 24 %
Lymphs Abs: 0.5 10*3/uL — ABNORMAL LOW (ref 0.7–4.0)
MCH: 30.9 pg (ref 26.0–34.0)
MCHC: 34.8 g/dL (ref 30.0–36.0)
MCV: 89.1 fL (ref 80.0–100.0)
Monocytes Absolute: 0.2 10*3/uL (ref 0.1–1.0)
Monocytes Relative: 8 %
Neutro Abs: 1.5 10*3/uL — ABNORMAL LOW (ref 1.7–7.7)
Neutrophils Relative %: 68 %
Platelets: 102 10*3/uL — ABNORMAL LOW (ref 150–400)
RBC: 4.75 MIL/uL (ref 4.22–5.81)
RDW: 12.7 % (ref 11.5–15.5)
WBC: 2.2 10*3/uL — ABNORMAL LOW (ref 4.0–10.5)
nRBC: 0 % (ref 0.0–0.2)

## 2022-08-23 LAB — D-DIMER, QUANTITATIVE: D-Dimer, Quant: 1.56 ug/mL-FEU — ABNORMAL HIGH (ref 0.00–0.50)

## 2022-08-23 LAB — LACTATE DEHYDROGENASE
LDH: 156 U/L (ref 98–192)
LDH: 173 U/L (ref 98–192)

## 2022-08-23 LAB — PHOSPHORUS: Phosphorus: 3.4 mg/dL (ref 2.5–4.6)

## 2022-08-23 LAB — MAGNESIUM: Magnesium: 1.8 mg/dL (ref 1.7–2.4)

## 2022-08-23 LAB — CULTURE, BLOOD (ROUTINE X 2)

## 2022-08-23 LAB — FERRITIN: Ferritin: 432 ng/mL — ABNORMAL HIGH (ref 24–336)

## 2022-08-23 LAB — SEDIMENTATION RATE: Sed Rate: 8 mm/hr (ref 0–16)

## 2022-08-23 NOTE — Progress Notes (Signed)
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  His wife states that she normally understands what he is saying but last night she cannot understand what he was saying and he was mumbling.  He wanted something but they cannot figure it out and he did not eat much of his dinner and he was not helped at all with them moving him.  Usually he is a two-person assist but wife feels that he is at worsening weakness of his left side.  Right-sided he is remains weak from his hemiparesis and hemiplegia from his prior CVA.  On the morning of admission he is still not helping him with his transfers from the bed to the wheelchair and he had a only ate about half a waffle.  Patient would not also brush his teeth and was becoming very lethargic and could not keep his eyes open and had 1 episode of diarrhea denies before.  He is brought into the ED for further evaluation and was found to be COVID-positive.  Chest x-ray showed small layering left-sided pleural effusion with mild streaky left basilar opacity which related to the atelectasis.  Head CT showed a lacunar infarct within the left thalamus which was new from his prior head CT of 09/23/2019 but was age-indeterminate.  MRI of the brain was done and showed no acute intracranial processes and no evidence of acute or subacute CVA.   Currently we are evaluating him and PT OT has now recommended SNF and he want MBS and they are recommending a regular diet with thin liquids.  Given that his  inflammatory markers are elevated we have initiated IV steroids.  Will need to check with social work.  Given that family is agreeable to go to SNF will need to check with the social worker about isolation for SNF.   Assessment and Plan: * Acute Encephalopathy with some Dysarthria worse than baseline as well as some left-sided weakness worse than baseline, improving slowly  -The patient is a 89 year old male presenting with 2 day onset worsening lethargy, weakness, worsening dysarthria from baseline and confusion  -obs to telemetry instead inpatient -found to be covid positive which could be driving factor of everything  -CT head with lacunar infarct within the left thalamus, new from prior head CT on 09/2019,but otherwise age indeterminate.  -MRI brain showed no acute intracranial findings and no evidence of acute or subacute CVA  -neuro checks -MBS done and now on a Regular Diet Thin Liquids -ammonia wnl, TSH/B12 as below -Blood Cx pending and showing NGTD at 3 Days, but no obvious bacterial source -UA negative and urine culture currently showing >100,000 CFU of multiple Species  -May consider getting an EEG -See below and may need to discuss with neurology further -Family thinks he is slowly improving but he remains weak and they are agreeable for SNF   COVID-19 Virus infection -He has had no respiratory symptoms or fever at home and only symptoms has been lethargy but now has a slight cough -Discussed if less than 5 days of symptoms  we could do paxolovid, but since we can't verify exact onset of symptoms will continue supportive care a this time -PPE/airborne and contact precautions -Check inflammatory markers and repeat chest x-ray in the a.m. Inflammatory Marker Trend: Recent Labs    08/22/22 0456 08/22/22 1915 08/23/22 0758  DDIMER 1.63*  --  1.56*  FERRITIN 376* 386*  --   LDH 166  --  156  CRP 5.7*  --  6.0*    Lab Results  Component Value Date   SARSCOV2NAA POSITIVE (A)  08/20/2022   SARSCOV2NAA NEGATIVE 10/06/2019   SARSCOV2NAA NEGATIVE 09/04/2019  -Initiated IV steroids given that his inflammatory markers are elevated and continue IV Methylprednisolone 40 mg BID and then change to po Prednisone 50 mg po qHS -No respiratory complains/symptoms currently but family states he is coughing some now -WBC Trend: Recent Labs  Lab 08/20/22 1328 08/21/22 1043 08/23/22 0758  WBC 5.9 6.0 2.2*  -supportive care -gentle IVF as below now stopped  -PT/OT recommending SNF and needs isolation for 10 days   Generalized Weakness and Physical Debility worse than baseline -Likely secondary to covid infection but is improving  -PT/OT/SLP and they are recommending SNF -If does not return to baseline will need rehab/SNF -Check TSH and B12; They were 0.937 and 83 respectively; Suppplement B12  -Gentle, time limited IVF resumed -Fall precautions    THROMBOCYTOPENIA -Chronic thrombocytopenia dating back to 2012 -Slightly worse in setting of viral infection -Platelet Count Trend: Recent Labs  Lab 08/20/22 1328 08/21/22 1043 08/23/22 0758  PLT 110* 93* 102*  -Continue to Monitor and Repeat CBC in the AM     Hemiplegia and hemiparesis following cerebral infarction affecting right dominant side -At baseline -PT/OT recommending SNF   ?ST Elevation -Had ST Elevation V5 on Telemetry -Troponin Negative -EKG shows no ST Elevation   Coronary artery disease involving native coronary artery of native heart without angina pectoris -S/p CABG -Stable -Troponin Negative  -Continue  medical management with zetia, ASA and beta blocker -Statin intolerant    S/P AVR (aortic valve replacement) -Echo 08/2019: Bioprosthetic aortic valve. No significant stenosis or regurgitation  noted.    Dyslipidemia -Statin intolerant, continue zetia -Lipid panel done and showed a total cholesterol/HDL ratio 4.0, cholesterol level 130, HDL 33, LDL of 88, triglycerides of 57, VLDL 11    Essential Hypertension -BP soft, hold atenolol for now  -Allow for permissive HTN until acute CVA ruled out -IV parameters placed PRN  -Continue to Monitor BP per Protocol and last BP reading was 119/66   COPD (chronic obstructive pulmonary disease) -No s/sx of exacerbation -No rescue inhaler or maintenance inhaler  -SpO2: 96 % -Chest x-ray done and showed "Small left pleural effusion, unchanged from prior exam."  Hyponatremia -Mild. Na+ Trend: Recent Labs  Lab 08/20/22 1222 08/20/22 1234 08/21/22 1043 08/23/22 0758  NA 133* 135 136 132*  -Continue to Monitor and Trend and Repeat CMP in the AM    GERD -Continue Protonix >IV while NPO is on a dysphagia 3 diet and getting an MBS in the morning -MBS done and recommending a Regular Diet with Thin Liquids -C/w Pantoprazole 40 mg po/IV Daily    Depression, major, single episode, moderate -Continue Escitalopram 10 mg po Daily      Hypoalbuminemia -Patient's Albumin Trend: Recent Labs  Lab 08/20/22 1222 08/21/22 1043 08/23/22 0758  ALBUMIN 2.9* 2.8* 2.6*  -Continue to Monitor and Trend and repeat CMP in the AM  DVT prophylaxis: SCDs Start: 08/20/22 1615  Code Status: DNR Family Communication: Discussed with family present at bedisde  Disposition Plan:  Level of care: Telemetry Medical Status is: Inpatient Remains inpatient appropriate because: Further clinical improvement and needs to remain isolated until he can go to SNF   Consultants:  None  Procedures:  As delineated as above  Antimicrobials:  Anti-infectives (From admission, onward)    None       Subjective: Seen and examined at bedside that he is more awake and alert and he feels okay.  Family thinks he continues with slurred speech and longer he talks about he is answering appropriately.  Currently denies any complaints and thinks he is getting a little bit stronger.  No shortness of breath and no cough noted right now.  Objective: Vitals:    08/23/22 0109 08/23/22 0435 08/23/22 0828 08/23/22 1100  BP: 120/60 122/68 132/65 (!) 106/53  Pulse: 63 62 69 70  Resp: Temp: 97.8 F (36.6 C) (!) 97.4 F (36.3 C) 98.2 F (36.8 C) 98 F (36.7 C)  TempSrc: Oral Oral Oral Oral  SpO2: 95% 95% 99% 97%  Weight:      Height:        Intake/Output Summary (Last 24 hours) at 08/23/2022 1559 Last data filed at 08/23/2022 0900 Gross per 24 hour  Intake 360 ml  Output 800 ml  Net -440 ml   Filed Weights   08/20/22 1700  Weight: 81.2 kg   Examination: Physical Exam:  Constitutional: WN/WD overweight elderly Caucasian male who is awake and alert and in no acute distress Respiratory: Diminished to auscultation bilaterally with coarse breath sounds worse on the left compared to the right, no wheezing, rales, rhonchi or crackles. Normal respiratory effort and patient is not tachypenic. No accessory muscle use. Unlabored breathing  Cardiovascular: RRR, no murmurs / rubs / gallops. S1 and S2 auscultated. No extremity edema.  Abdomen: Soft, non-tender, distended secondary to body habitus.  Bowel sounds positive.  GU: Deferred. Musculoskeletal: No clubbing / cyanosis of digits/nails. No joint deformity upper and lower extremities.  Skin: No rashes, lesions, ulcers on limited skin evaluation. No induration; Warm and dry.  Neurologic: Nerves II through XII grossly intact and he does mumble some of his words.  Has right-sided weakness and hemiparesis from his prior stroke.  Left side has some strength and feels a little bit better Psychiatric: Impaired judgment and insight.  Alert and oriented x 2.  Data Reviewed: I have personally reviewed following labs and imaging studies  CBC: Recent Labs  Lab 08/20/22 1234 08/20/22 1328 08/21/22 1043 08/23/22 0758  WBC  --  5.9 6.0 2.2*  NEUTROABS  --  4.5 3.3 1.5*  HGB 13.9 14.0 14.2 14.7  HCT 41.0 41.2 43.2 42.3  MCV  --  92.8 94.3 89.1  PLT  --  110* 93* 102*   Basic Metabolic  Panel: Recent Labs  Lab 08/20/22 1222 08/20/22 1234 08/21/22 1043 08/23/22 0758  NA 133* 135 136 132*  K 4.1 4.3 4.3 3.8  CL 101  --  104 98  CO2 22  --  23 22  GLUCOSE 123*  --  80 159*  BUN 14  --  15 11  CREATININE 1.23  --  1.20 1.00  CALCIUM 8.3*  --  8.4* 8.6*  MG  --   --  2.0 1.8  PHOS  --   --  3.2 3.4   GFR: Estimated Creatinine Clearance: 43.6 mL/min (by C-G formula based on  SCr of 1 mg/dL). Liver Function Tests: Recent Labs  Lab 08/20/22 1222 08/21/22 1043 08/23/22 0758  AST 18 26 22   ALT 10 9 13   ALKPHOS 54 45 46  BILITOT 0.6 0.9 0.5  PROT 6.2* 5.9* 6.0*  ALBUMIN 2.9* 2.8* 2.6*   No results for input(s): "LIPASE", "AMYLASE" in the last 168 hours. Recent Labs  Lab 08/20/22 1222  AMMONIA 32   Coagulation Profile: No results for input(s): "INR", "PROTIME" in the last 168 hours. Cardiac Enzymes: No results for input(s): "CKTOTAL", "CKMB", "CKMBINDEX", "TROPONINI" in the last 168 hours. BNP (last 3 results) No results for input(s): "PROBNP" in the last 8760 hours. HbA1C: No results for input(s): "HGBA1C" in the last 72 hours. CBG: Recent Labs  Lab 08/20/22 1333  GLUCAP 147*   Lipid Profile: Recent Labs    08/21/22 1043  CHOL 132  HDL 33*  LDLCALC 88  TRIG 57  CHOLHDL 4.0   Thyroid Function Tests: Recent Labs    08/20/22 1700  TSH 0.937   Anemia Panel: Recent Labs    08/20/22 1700 08/22/22 0456 08/22/22 1915  VITAMINB12 89*  --   --   FERRITIN  --  376* 386*   Sepsis Labs: Recent Labs  Lab 08/20/22 1222  LATICACIDVEN 1.3    Recent Results (from the past 240 hour(s))  Blood culture (routine x 2)     Status: None (Preliminary result)   Collection Time: 08/20/22 12:22 PM   Specimen: BLOOD  Result Value Ref Range Status   Specimen Description BLOOD SITE NOT SPECIFIED  Final   Special Requests   Final    BOTTLES DRAWN AEROBIC AND ANAEROBIC Blood Culture results may not be optimal due to an inadequate volume of blood  received in culture bottles   Culture   Final    NO GROWTH 3 DAYS Performed at [facility], [address], [place], [place] [address]    Report Status PENDING  Incomplete  Blood culture (routine x 2)     Status: None (Preliminary result)   Collection Time: 08/20/22 12:30 PM   Specimen: BLOOD RIGHT ARM  Result Value Ref Range Status   Specimen Description BLOOD RIGHT ARM  Final   Special Requests   Final    BOTTLES DRAWN AEROBIC AND ANAEROBIC Blood Culture adequate volume   Culture   Final    NO GROWTH 3 DAYS Performed at [facility], [address], [place], [place] [address]    Report Status PENDING  Incomplete  Resp panel by RT-PCR (RSV, Flu A&B, Covid) Anterior Nasal Swab     Status: Abnormal   Collection Time: 08/20/22  1:24 PM   Specimen: Anterior Nasal Swab  Result Value Ref Range Status   SARS Coronavirus 2 by RT PCR POSITIVE (A) NEGATIVE Final   Influenza A by PCR NEGATIVE NEGATIVE Final   Influenza B by PCR NEGATIVE NEGATIVE Final    Comment: (NOTE) The Xpert Xpress SARS-CoV-2/FLU/RSV plus assay is intended as an aid in the diagnosis of influenza from Nasopharyngeal swab specimens and should not be used as a sole basis for treatment. Nasal washings and aspirates are unacceptable for Xpert Xpress SARS-CoV-2/FLU/RSV testing.  Fact Sheet for Patients: BloggerCourse.com  Fact Sheet for Healthcare Providers: SeriousBroker.it  This test is not yet approved or cleared by the [place] FDA and has been authorized for detection and/or diagnosis of SARS-CoV-2 by FDA under an Emergency Use Authorization (EUA). This EUA will remain in effect (meaning  this test can be used) for the duration of the COVID-19 declaration under Section 564(b)(1) of the Act, 21 U.S.C. section 360bbb-3(b)(1), unless the authorization is terminated or revoked.     Resp Syncytial Virus by PCR NEGATIVE NEGATIVE Final    Comment:  (NOTE) Fact Sheet for Patients: BloggerCourse.com  Fact Sheet for Healthcare Providers: SeriousBroker.it  This test is not yet approved or cleared by the [place] FDA and has been authorized for detection and/or diagnosis of SARS-CoV-2 by FDA under an Emergency Use Authorization (EUA). This EUA will remain in effect (meaning this test can be used) for the duration of the COVID-19 declaration under Section 564(b)(1) of the Act, 21 U.S.C. section 360bbb-3(b)(1), unless the authorization is terminated or revoked.  Performed at [facility], [address], [place], [place] [address]   Urine Culture (for pregnant, neutropenic or urologic patients or patients with an indwelling urinary catheter)     Status: Abnormal   Collection Time: 08/20/22  2:35 PM   Specimen: Urine, Clean Catch  Result Value Ref Range Status   Specimen Description URINE, CLEAN CATCH  Final   Special Requests   Final    ADDED 2140 Performed at [facility], [address], [place], [place] [address]    Culture (A)  Final    >=100,000 COLONIES/mL MULTIPLE SPECIES PRESENT, SUGGEST RECOLLECTION   Report Status 08/22/2022 FINAL  Final     Radiology Studies: DG CHEST PORT 1 VIEW  Result Date: 08/23/2022 CLINICAL DATA:  Shortness of breath EXAM: PORTABLE CHEST 1 VIEW COMPARISON:  CXR 08/20/22 FINDINGS: Status post median sternotomy CABG. Status post aortic valve repair. Small left pleural effusion, unchanged from prior exam. Right lung is normal in appearance. Upper abdomen is notable for nondistended enteric contrast opacified colon. No radiographically apparent displaced rib fractures. IMPRESSION: Small left pleural effusion, unchanged from prior exam. Electronically Signed   By: Lorenza Cambridge M.D.   On: 08/23/2022 08:07   DG Swallowing Func-Speech Pathology  Result Date: 08/22/2022 Table formatting from the original result was not included. Modified  Barium Swallow Study Patient Details Name: David [name] MRN: [id number] Date of Birth: 1923/05/20 Today's Date: 08/22/2022 HPI/PMH: HPI: David [name] is a 89 y.o. male who presented to ED with complaints of speech difficulty worse than baseline. Found to be COVID+. Family reports decreased PO intake.  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 Hx of silent aspiraiton per MBS x2 in 2021 with CIR following CVA. Clinical Impression: Clinical Impression: Pt presents with generally functional oropharyngeal swallowing with mild changes. He has a collection of oral residue on his tongue with solids that he clears spontaneously with a second swallow. His swallow initiates at the pyriform sinuses with thin liquids, and at times he has trace penetration with thin liquids that is consistently cleared with cup sips as he goes on to achieve adequate laryngeal vestibule closure. A trace amount of thin liquids via straw did reach as far as his pyriform sinuses, and then fell below his vocal folds to become silent aspiration as he went to take his next bolus. When cued to clear his throat, this also appeared to clear. Note that pt also had retention of barium including the tablet in the distal esophagus, where his anatomy was more tortuous, although this test is not diagnostic of the esophagus. Recommend that pt remain on regular  diet and thin liquids. Could consider focusing on cup sips or offering meds in puree. Factors that may increase risk of adverse event in presence of aspiration [name] & [name] 2021): Factors that may increase risk of adverse event in presence of aspiration [name] & [name] 2021): Frail or deconditioned Recommendations/Plan: Swallowing Evaluation Recommendations Swallowing Evaluation Recommendations Recommendations: PO diet PO Diet Recommendation: Regular; Thin liquids (Level 0) Liquid  Administration via: Cup Medication Administration: Crushed with puree (may want to crush larger pills) Supervision: Staff to assist with self-feeding Swallowing strategies  : Slow rate; Small bites/sips Postural changes: Stay upright 30-60 min after meals; Position pt fully upright for meals Oral care recommendations: Oral care BID (2x/day) Treatment Plan Treatment Plan Treatment recommendations: Therapy as outlined in treatment plan below Follow-up recommendations: No SLP follow up Functional status assessment: Patient has had a recent decline in their functional status and demonstrates the ability to make significant improvements in function in a reasonable and predictable amount of time. Treatment frequency: Min 1x/week Treatment duration: 1 week Interventions: Aspiration precaution training; Compensatory techniques; Patient/family education; Trials of upgraded texture/liquids Recommendations Recommendations for follow up therapy are one component of a multi-disciplinary discharge planning process, led by the attending physician.  Recommendations may be updated based on patient status, additional functional criteria and insurance authorization. Assessment: Orofacial Exam: Orofacial Exam Oral Cavity - Dentition: Adequate natural dentition Orofacial Anatomy: WFL Anatomy: Anatomy: Suspected cervical osteophytes; Other (Comment) (distal esophagus appeared to be tortuous) Boluses Administered: Boluses Administered Boluses Administered: Thin liquids (Level 0); Mildly thick liquids (Level 2, nectar thick); Moderately thick liquids (Level 3, honey thick); Puree; Solid  Oral Impairment Domain: Oral Impairment Domain Lip Closure: No labial escape Tongue control during bolus hold: Posterior escape of greater than half of bolus (cognitively did not follow instructions) Bolus preparation/mastication: Timely and efficient chewing and mashing Bolus transport/lingual motion: Brisk tongue motion Oral residue: Residue collection  on oral structures Location of oral residue : Tongue; Palate Initiation of pharyngeal swallow : Pyriform sinuses  Pharyngeal Impairment Domain: Pharyngeal Impairment Domain Soft palate elevation: No bolus between soft palate (SP)/pharyngeal wall (PW) Laryngeal elevation: Complete superior movement of thyroid cartilage with complete approximation of arytenoids to epiglottic petiole Anterior hyoid excursion: Partial anterior movement Epiglottic movement: Complete inversion Laryngeal vestibule closure: Incomplete, narrow column air/contrast in laryngeal vestibule Pharyngeal stripping wave : Present - complete Pharyngeal contraction (A/P view only): N/A Pharyngoesophageal segment opening: Complete distension and complete duration, no obstruction of flow Tongue base retraction: No contrast between tongue base and posterior pharyngeal wall (PPW) Pharyngeal residue: Trace residue within or on pharyngeal structures Location of pharyngeal residue: Valleculae  Esophageal Impairment Domain: Esophageal Impairment Domain Esophageal clearance upright position: Esophageal retention Pill: Esophageal Impairment Domain Esophageal clearance upright position: Esophageal retention Penetration/Aspiration Scale Score: Penetration/Aspiration Scale Score 1.  Material does not enter airway: Puree; Solid; Pill; Moderately thick liquids (Level 3, honey thick) 2.  Material enters airway, remains ABOVE vocal cords then ejected out: Mildly thick liquids (Level 2, nectar thick) 8.  Material enters airway, passes BELOW cords without attempt by patient to eject out (silent aspiration) : Thin liquids (Level 0) Compensatory Strategies: Compensatory Strategies Compensatory strategies: Yes Straw: Ineffective Ineffective Straw: Thin liquid (Level 0)   General Information: Caregiver present: No  Diet Prior to this Study: Regular; Thin liquids (Level 0)   Temperature : Normal   Respiratory Status: WFL   Supplemental O2: None (Room air)   History of Recent  Intubation: No  Behavior/Cognition: Alert; Cooperative; Pleasant  mood Self-Feeding Abilities: Needs assist with self-feeding Baseline vocal quality/speech: Normal Volitional Cough: Able to elicit Volitional Swallow: Able to elicit No data recorded Goal Planning: Prognosis for improved oropharyngeal function: Fair Barriers to Reach Goals: Time post onset No data recorded Patient/Family Stated Goal: would like MBS Consulted and agree with results and recommendations: Patient; Nurse; Physician Pain: Pain Assessment Pain Assessment: Faces Faces Pain Scale: 0 Pain Intervention(s): Monitored during session; Repositioned End of Session: Start Time:SLP Start Time (ACUTE ONLY): 1520 Stop Time: SLP Stop Time (ACUTE ONLY): 1557 Time Calculation:SLP Time Calculation (min) (ACUTE ONLY): 37 min Charges: SLP Evaluations $ SLP Speech Visit: 1 Visit SLP Evaluations $BSS Swallow: 1 Procedure $MBS Swallow: 1 Procedure SLP visit diagnosis: SLP Visit Diagnosis: Dysphagia, oropharyngeal phase (R13.12) Past Medical History: Past Medical History: Diagnosis Date  CAD (coronary artery disease)   intolerance of statins  COPD (chronic obstructive pulmonary disease)   Current use of long term anticoagulation   GERD (gastroesophageal reflux disease)   Hyperlipidemia   Hypertension   Impaired glucose tolerance 02/14/2011  LBP (low back pain)   OA (osteoarthritis)   S/P AVR (aortic valve replacement) 2009  bonine valve Past Surgical History: Past Surgical History: Procedure Laterality Date  AORTIC VALVE REPLACEMENT  01/15/1999  bovine  CARDIAC CATHETERIZATION  10/05/1998  Recommend CABG revascularizaition  CARDIAC CATHETERIZATION  06/11/1999  Continue medical therapy  CARDIAC CATHETERIZATION  01/08/2007  Patent grafts, recommend medical therapy  CARDIOVASCULAR STRESS TEST  04/22/2010  Small lateral defect which is partially reversible, no ischemic EKG changes were noted.  CAROTID DOPPLER  04/22/2010  Bilateral ICAs-no evidence of diametere reduction,  significant tortuosity, or any other vascular abnormality.  CORONARY ARTERY BYPASS GRAFT  10/09/1998  LIMA to LAD, SVG to diag, SVG sequential to 2 marginals, SVG sequential to PDA & PLA. CE#25 porcine AVR.  TRANSTHORACIC ECHOCARDIOGRAM  05/14/2012  EF 50-55%, bioprosthesis of the aortic valve with well preserved LV function, moderate concentric LVGH Mahala MenghiniLaura N., M.A. CCC-SLP Acute Rehabilitation Services Office [phone] Secure chat preferred 08/22/2022, 5:15 PM   Scheduled Meds:  aspirin  81 mg Oral Daily   vitamin B-12  1,000 mcg Oral Daily   escitalopram  10 mg Oral Daily   folic acid  1 mg Oral Daily   methylPREDNISolone (SOLU-MEDROL) injection  40 mg Intravenous BID   Followed by   Melene Muller[START ON 08/25/2022] predniSONE  50 mg Oral Daily   multivitamin with minerals  1 tablet Oral Daily   mouth rinse  15 mL Mouth Rinse 4 times per day   pantoprazole  40 mg Oral Daily   Or   pantoprazole (PROTONIX) IV  40 mg Intravenous Daily   thiamine  100 mg Oral Daily   Continuous Infusions:   LOS: 2 days   Marguerita Merlesmair Tionne Dayhoff, DO [facility] Available via Epic secure chat 7am-7pm After these hours, please refer to coverage provider listed on [email or web address] 08/23/2022, 3:59 PM

## 2022-08-23 NOTE — Progress Notes (Signed)
Occupational Therapy Treatment Patient Details Name: David [name] MRN: [id number] DOB: 1924-04-20 Today's Date: 08/23/2022   History of present illness David [name] is a 89 y.o. male admitted 08/20/22 with speech difficulty worse than baseline, lethargy, diarrhea. CT revealed new Lacunar infarct within the left thalamus, also found to be COVID +. 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   OT comments  This 89 yo male admitted and underwent above presents to acute OT with focus on LUE exercise to build strength for bed mobility and transfers. He was able to do theraband level 2 (red) with setup/S-min A at bed level with HOB raised. Patient will benefit from intensive inpatient follow up therapy, >3 hours/day.    Recommendations for follow up therapy are one component of a multi-disciplinary discharge planning process, led by the attending physician.  Recommendations may be updated based on patient status, additional functional criteria and insurance authorization.    Assistance Recommended at Discharge Frequent or constant Supervision/Assistance  Patient can return home with the following  Two people to help with walking and/or transfers;A lot of help with bathing/dressing/bathroom;Assistance with cooking/housework;Direct supervision/assist for medications management;Direct supervision/assist for financial management;Assist for transportation;Help with stairs or ramp for entrance   Equipment Recommendations  None recommended by OT       Precautions / Restrictions Precautions Precautions: Fall Precaution Comments: R hemi at baseline Required Braces or Orthoses: Other Brace Other Brace: [facility] and shoes should be worn for standing and transfers (pt only uses a stand lift at home for transfers) Restrictions Weight Bearing Restrictions: No                 Extremity/Trunk  Assessment Upper Extremity Assessment Upper Extremity Assessment: RUE deficits/detail;LUE deficits/detail RUE Deficits / Details: impacted by previous CVA - rigid and does not use functionally LUE Deficits / Details: generalized weakness            Vision Baseline Vision/History: 1 Wears glasses Patient Visual Report: No change from baseline            Cognition Arousal/Alertness: Awake/alert Behavior During Therapy: WFL for tasks assessed/performed Overall Cognitive Status: Within Functional Limits for tasks assessed                                 General Comments: HOH, sometimes needs gestural cues in addition to VCs        Exercises Other Exercises Other Exercises: Had pt work on LUE exercises at bed level with level 2 (red) theraband. He was able to reach across his body and back, reach towards toes and back, and reach towards ceiling and back with theraband tied to left bed rail. He was able to use LUE to reach across body to theraband on right upper bed rail and pull back across his body. He did 5 reps of each from setup/S-min A. Written instructions left with dtr in room and recommended that pt do them 3x/day 5 reps each. Pt did c/o the theraband irritating his webspace between thumb and first finger--place mepilex in this area and gave dtr 4 other pieces to use as needed when he was doing the exercises each day.            Pertinent Vitals/ Pain       Pain Assessment Pain Assessment: No/denies pain Faces  Pain Scale: No hurt         Frequency  Min 2X/week        Progress Toward Goals  OT Goals(current goals can now be found in the care plan section)  Progress towards OT goals: Progressing toward goals  Acute Rehab OT Goals Patient Stated Goal: to be able to get to a point where family can take him home (per his dtr in room) OT Goal Formulation: With patient/family Time For Goal Achievement: 09/04/22 Potential to Achieve Goals: Good  Plan  Discharge plan remains appropriate       AM-PAC OT "6 Clicks" Daily Activity     Outcome Measure   Help from another person eating meals?: A Lot Help from another person taking care of personal grooming?: A Lot Help from another person toileting, which includes using toliet, bedpan, or urinal?: Total Help from another person bathing (including washing, rinsing, drying)?: A Lot Help from another person to put on and taking off regular upper body clothing?: Total Help from another person to put on and taking off regular lower body clothing?: Total 6 Click Score: 9    End of Session    OT Visit Diagnosis: Muscle weakness (generalized) (M62.81);Other symptoms and signs involving the nervous system (R29.898)   Activity Tolerance Patient tolerated treatment well   Patient Left in bed;with bed alarm set;with family/visitor present           Time: 2035-5974 OT Time Calculation (min): 27 min  Charges: OT General Charges $OT Visit: 1 Visit OT Treatments $Therapeutic Exercise: 23-37 mins  Lindon Romp OT Acute Rehabilitation Services Office [phone]    Evette Georges 08/23/2022, 4:44 PM

## 2022-08-24 DIAGNOSIS — G9340 Encephalopathy, unspecified: Secondary | ICD-10-CM | POA: Diagnosis not present

## 2022-08-24 DIAGNOSIS — I69351 Hemiplegia and hemiparesis following cerebral infarction affecting right dominant side: Secondary | ICD-10-CM | POA: Diagnosis not present

## 2022-08-24 DIAGNOSIS — U071 COVID-19: Secondary | ICD-10-CM | POA: Diagnosis not present

## 2022-08-24 DIAGNOSIS — D696 Thrombocytopenia, unspecified: Secondary | ICD-10-CM | POA: Diagnosis not present

## 2022-08-24 LAB — COMPREHENSIVE METABOLIC PANEL
ALT: 13 U/L (ref 0–44)
AST: 20 U/L (ref 15–41)
Albumin: 2.7 g/dL — ABNORMAL LOW (ref 3.5–5.0)
Alkaline Phosphatase: 46 U/L (ref 38–126)
Anion gap: 8 (ref 5–15)
BUN: 14 mg/dL (ref 8–23)
CO2: 23 mmol/L (ref 22–32)
Calcium: 8.4 mg/dL — ABNORMAL LOW (ref 8.9–10.3)
Chloride: 101 mmol/L (ref 98–111)
Creatinine, Ser: 1 mg/dL (ref 0.61–1.24)
GFR, Estimated: >60 mL/min (ref >60)
Glucose, Bld: 169 mg/dL — ABNORMAL HIGH (ref 70–99)
Potassium: 4.3 mmol/L (ref 3.5–5.1)
Sodium: 132 mmol/L — ABNORMAL LOW (ref 135–145)
Total Bilirubin: 0.8 mg/dL (ref 0.3–1.2)
Total Protein: 6.1 g/dL — ABNORMAL LOW (ref 6.5–8.1)

## 2022-08-24 LAB — CBC WITH DIFFERENTIAL/PLATELET
Abs Immature Granulocytes: 0.02 10*3/uL (ref 0.00–0.07)
Basophils Absolute: 0 10*3/uL (ref 0.0–0.1)
Basophils Relative: 0 %
Eosinophils Absolute: 0 10*3/uL (ref 0.0–0.5)
Eosinophils Relative: 0 %
HCT: 41.3 % (ref 39.0–52.0)
Hemoglobin: 14.9 g/dL (ref 13.0–17.0)
Immature Granulocytes: 1 %
Lymphocytes Relative: 17 %
Lymphs Abs: 0.7 10*3/uL (ref 0.7–4.0)
MCH: 31.2 pg (ref 26.0–34.0)
MCHC: 36.1 g/dL — ABNORMAL HIGH (ref 30.0–36.0)
MCV: 86.6 fL (ref 80.0–100.0)
Monocytes Absolute: 0.5 10*3/uL (ref 0.1–1.0)
Monocytes Relative: 11 %
Neutro Abs: 3 10*3/uL (ref 1.7–7.7)
Neutrophils Relative %: 71 %
Platelets: 104 10*3/uL — ABNORMAL LOW (ref 150–400)
RBC: 4.77 MIL/uL (ref 4.22–5.81)
RDW: 12.7 % (ref 11.5–15.5)
WBC: 4.2 10*3/uL (ref 4.0–10.5)
nRBC: 0 % (ref 0.0–0.2)

## 2022-08-24 LAB — MAGNESIUM: Magnesium: 2.3 mg/dL (ref 1.7–2.4)

## 2022-08-24 LAB — FIBRINOGEN: Fibrinogen: 428 mg/dL (ref 210–475)

## 2022-08-24 LAB — SEDIMENTATION RATE: Sed Rate: 6 mm/hr (ref 0–16)

## 2022-08-24 LAB — PHOSPHORUS: Phosphorus: 2.5 mg/dL (ref 2.5–4.6)

## 2022-08-24 LAB — CULTURE, BLOOD (ROUTINE X 2): Special Requests: ADEQUATE

## 2022-08-24 LAB — D-DIMER, QUANTITATIVE: D-Dimer, Quant: 1.64 ug/mL-FEU — ABNORMAL HIGH (ref 0.00–0.50)

## 2022-08-24 MED ORDER — ENOXAPARIN SODIUM 40 MG/0.4ML IJ SOSY
40.0000 mg | PREFILLED_SYRINGE | INTRAMUSCULAR | Status: DC
  Administered 2022-08-24 – 2022-08-27 (×4): 40 mg via SUBCUTANEOUS
  Filled 2022-08-24 (×4): qty 0.4

## 2022-08-24 MED ORDER — POTASSIUM CHLORIDE CRYS ER 20 MEQ PO TBCR
40.0000 meq | EXTENDED_RELEASE_TABLET | Freq: Once | ORAL | Status: AC
  Administered 2022-08-24: 40 meq via ORAL
  Filled 2022-08-24: qty 2

## 2022-08-24 MED ORDER — MAGNESIUM SULFATE IN D5W 1-5 GM/100ML-% IV SOLN
1.0000 g | Freq: Once | INTRAVENOUS | Status: AC
  Administered 2022-08-24: 1 g via INTRAVENOUS
  Filled 2022-08-24: qty 100

## 2022-08-24 NOTE — Progress Notes (Signed)
Central telemetry called pt had 13 beats run of Vtach, HR was 151. Pt appears comfortably lying on his bed, asleep. BP 133/66, HR 61, 97% on room air. On call provider made aware. See new orders.

## 2022-08-24 NOTE — Progress Notes (Signed)
Physical Therapy Treatment Patient Details Name: David [name] MRN: [id number] DOB: [birth year] Today's Date: 08/24/2022   History of Present Illness David [name] is a 89 y.o. male admitted 08/20/22 with speech difficulty worse than baseline, lethargy, diarrhea. CT revealed new Lacunar infarct within the left thalamus, also found to be COVID +. 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    PT Comments    Focused session on bed mobility and transfer training while strengthening pt's lower extremities through varying the height from which pt would transfer to stand. Pt displays increased difficulty with DOE when performing transfers from a low bed height, needing modA. He was able to stand from an elevated surface with only minA though. Pt's daughter present and reporting continued concern of their ability to physically assist him at this time, hopeful for improved independence with short-term inpatient rehab, <3 hours/day. Will continue to follow acutely.     Recommendations for follow up therapy are one component of a multi-disciplinary discharge planning process, led by the attending physician.  Recommendations may be updated based on patient status, additional functional criteria and insurance authorization.  Follow Up Recommendations  Can patient physically be transported by private vehicle: No    Assistance Recommended at Discharge Frequent or constant Supervision/Assistance  Patient can return home with the following Two people to help with walking and/or transfers;A lot of help with bathing/dressing/bathroom;Assistance with cooking/housework;Direct supervision/assist for medications management;Direct supervision/assist for financial management;Assist for transportation;Help with stairs or ramp for entrance   Equipment Recommendations  None recommended by PT (pt well equipped)    Recommendations  for Other Services       Precautions / Restrictions Precautions Precautions: Fall Precaution Comments: R hemi at baseline Required Braces or Orthoses: Other Brace (ankle brace and shoes to be worn for transfers) Other Brace: [facility] Restrictions Weight Bearing Restrictions: No     Mobility  Bed Mobility Overal bed mobility: Needs Assistance Bed Mobility: Supine to Sit, Sit to Supine     Supine to sit: Mod assist, HOB elevated Sit to supine: Mod assist, HOB elevated   General bed mobility comments: Pt needing tactile cues to guide L leg off L EOB and modA to bring R leg off L EOB and ascend trunk while scooting hips to EOB using bed pad. ModA to direct trunk to lean onto his L elbow and to lift his legs with return to supine    Transfers Overall transfer level: Needs assistance Equipment used: Ambulation equipment used Transfers: Sit to/from Stand Sit to Stand: Min assist, From elevated surface, Mod assist           General transfer comment: stedy used to simulate pt's "sit and stand" at home, which also has bil knee block and a handle to grab while the bottom platform spins. Pt grabbing bar with L UE, displaying good initiation to stand each rep but more difficulty with lower surfaces. Performed x4 reps from elevated EOB, minA, and x4 reps from low bed surface height, modA    Ambulation/Gait               General Gait Details: unable at baseline   Stairs             Wheelchair Mobility    Modified Rankin (Stroke Patients Only)       Balance Overall balance assessment: Needs assistance Sitting-balance support: Feet supported,  Single extremity supported Sitting balance-Leahy Scale: Poor Sitting balance - Comments: Initially requiring minA-modA due to posterior lean but progressed to min guard assist with L UE support on stedy bar. Postural control: Posterior lean Standing balance support: Single extremity supported Standing  balance-Leahy Scale: Poor Standing balance comment: Reliant on L UE on stedy bar, bil knees blocked in steady, and minA for balance with cues to extend hips and look superiorly to obtain a more upright posture, but pt maintains a mildly flexed posture                            Cognition Arousal/Alertness: Awake/alert Behavior During Therapy: WFL for tasks assessed/performed Overall Cognitive Status: Within Functional Limits for tasks assessed                                 General Comments: sometimes hard to understand expressive aphasia, but follows directions with extra time        Exercises Other Exercises Other Exercises: sit <> stand x8 reps in stedy (x4 from elevated EOB, x4 from low bed)    General Comments        Pertinent Vitals/Pain Pain Assessment Pain Assessment: Faces Faces Pain Scale: Hurts a little bit Pain Location: bil hands Pain Descriptors / Indicators: Discomfort, Guarding Pain Intervention(s): Limited activity within patient's tolerance, Monitored during session, Repositioned    Home Living                          Prior Function            PT Goals (current goals can now be found in the care plan section) Acute Rehab PT Goals Patient Stated Goal: to get stronger PT Goal Formulation: With patient/family Time For Goal Achievement: 09/04/22 Potential to Achieve Goals: Fair Progress towards PT goals: Progressing toward goals    Frequency    Min 3X/week      PT Plan Current plan remains appropriate    Co-evaluation              AM-PAC PT "6 Clicks" Mobility   Outcome Measure  Help needed turning from your back to your side while in a flat bed without using bedrails?: A Little Help needed moving from lying on your back to sitting on the side of a flat bed without using bedrails?: A Lot Help needed moving to and from a bed to a chair (including a wheelchair)?: A Lot Help needed standing up from a  chair using your arms (e.g., wheelchair or bedside chair)?: A Lot Help needed to walk in hospital room?: Total Help needed climbing 3-5 steps with a railing? : Total 6 Click Score: 11    End of Session Equipment Utilized During Treatment: Gait belt Activity Tolerance: Patient tolerated treatment well Patient left: with call bell/phone within reach;in bed;with bed alarm set;with family/visitor present   PT Visit Diagnosis: Muscle weakness (generalized) (M62.81);Other abnormalities of gait and mobility (R26.89);Unsteadiness on feet (R26.81)     Time: 3646-8032 PT Time Calculation (min) (ACUTE ONLY): 30 min  Charges:  $Therapeutic Exercise: 8-22 mins $Therapeutic Activity: 8-22 mins                     Raymond Gurney, PT, DPT Acute Rehabilitation Services  Office: [phone]    Jewel Baize 08/24/2022, 4:34 PM

## 2022-08-24 NOTE — Progress Notes (Signed)
PROGRESS NOTE  David [name] ZOX:[id number] DOB: Jan 26, 1924   PCP: Tresa GarterPlotnikov, Aleksei V, MD  Patient is from: Home.  Lives with daughter.    DOA: 08/20/2022 LOS: 3  Chief complaints No chief complaint on file.    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  MRI brain without acute finding.  He tested positive for COVID-19.  No oxygen requirement.  CXR showed small layering left pleural effusion with mild streaky left basilar opacity related to atelectasis.  Briefly started on steroid.  Medically stable for discharge but remains physically deconditioned compared to his baseline.  Waiting on SNF.   Subjective: Seen and examined earlier this morning.  Reportedly had 3 beats of V. tach overnight.  Patient was not symptomatic.  Daughter, David [name] at the bedside.  Patient has no complaints but not a great historian.  He is only oriented to self and his daughter but not able to tell me her name.  He says he has right-sided weakness at baseline.  Denies pain, shortness of breath, GI or UTI symptoms.  Objective: Vitals:   08/24/22 0408 08/24/22 0737 08/24/22 1234 08/24/22 1554  BP: 136/86 (!) 142/61 119/64 (!) 113/51  Pulse: (!) 59 (!) 58 61 61  Resp: 16 16 18 16   Temp: 97.8 F (36.6 C) 97.8 F (36.6 C) 98.2 F (36.8 C) 98.2 F (36.8 C)  TempSrc: Oral Oral Oral Oral  SpO2: 96% 96% 97% 96%  Weight:      Height:        Examination:  GENERAL: No apparent distress.  Nontoxic. HEENT: MMM.  Vision and hearing grossly intact.  NECK: Supple.  No apparent JVD.  RESP:  No IWOB.  Fair aeration bilaterally. CVS:  RRR. Heart sounds normal.  ABD/GI/GU: BS+. Abd soft, NTND.  MSK/EXT: Right hemiparesis, 2+/5 SKIN: no apparent skin lesion or  wound NEURO: Awake and alert.  Oriented to self and daughter.  Follows commands.  Right hemiparesis, 2+/5.  Some dysarthria. PSYCH: Calm. Normal affect.   Procedures:  None  Microbiology summarized: 4/6-COVID-19 PCR positive. 4/6-urine culture with > 100,000 colonies but multiple species. 4/6-blood cultures NGTD  Assessment and plan: Principal Problem:   Acute encephalopathy Active Problems:   COVID-19 virus infection   Weakness   THROMBOCYTOPENIA   Hemiplegia and hemiparesis following cerebral infarction affecting right dominant side   Coronary artery disease involving native coronary artery of native heart without angina pectoris   S/P AVR (aortic valve replacement)   Dyslipidemia   Essential hypertension   COPD (chronic obstructive pulmonary disease)   GERD   Depression, major, single episode, moderate  Acute ends of apathy due to COVID-19 infection: COVID-19 PCR positive on 4/6.  Other ends of the workup including MRI brain, ammonia, TSH, and B12 without significant finding.  Urine culture with >100,000 CFU of multiple Species.  Other infectious workup as above.  Low suspicion for seizure.  He is awake and alert.  Oriented to self and family.  Remains physically deconditioned. -Discontinue steroid.  He has no oxygen requirement or respiratory distress.  Could increase delirium. -Reorientation and delirium precautions -Discontinue telemetry wires. -Cleared for regular diet per SLP -Aspiration precaution   COVID-19 Virus infection: Slightly elevated CRP and D-dimer.  No respiratory distress or significant finding on CXR. -Discontinue steroid -Start subcu  Lovenox for VTE prophylaxis.  High risk for VTE -Continue isolation precaution   Generalized Weakness and Physical Debility worse than baseline.  Likely due to COVID-19 History of CVA with right hemiparesis and dysarthria: MRI brain without acute finding.  -PT/OT-recommended SNF. -Aspirin and Zetia as below   History of  CAD/CABG: No anginal symptoms.  Serial troponin negative.  -Continue home Zetia, aspirin  -Hold atenolol due to borderline bradycardia   NSVT: Had 3 beats of V. tach overnight.  Optimize electrolytes -Holding atenolol due to borderline bradycardia   Aortic stenosis s/p bioprosthetic AVR: Stable.   Dyslipidemia: Statin intolerant.  Last LDL 88. -continue zetia  Essential Hypertension: Normotensive for most part. -Holding home dose atenolol due to borderline bradycardia   Chronic c/o: Stable. -Continue inhalers, maintenance and rescue   Hyponatremia: Mild.  Monitor intermittently.   GERD -Continue PPI.   History of depression: Stable. -Continue Escitalopram 10 mg po Daily      Hypoalbuminemia: Reactive in the setting of acute illness  Leukopenia/thrombocytopenia: Leukopenia resolved.  Thrombocytopenia stable. -Continue monitoring  Inadequate oral intake Body mass index is 25.69 kg/m. Nutrition Problem: Inadequate oral intake Etiology: decreased appetite Signs/Symptoms: per patient/family report Interventions: Refer to RD note for recommendations   DVT prophylaxis:  enoxaparin (LOVENOX) injection 40 mg Start: 08/24/22 0945 SCDs Start: 08/20/22 1615  Code Status: DNR/DNI Family Communication: Updated patient's daughter, David [name] at bedside Level of care: Med-Surg Status is: Inpatient Remains inpatient appropriate because: Placement/generalized weakness   Final disposition: SNF Consultants:  None  55 minutes with more than 50% spent in reviewing records, counseling patient/family and coordinating care.   Sch Meds:  Scheduled Meds:  aspirin  81 mg Oral Daily   vitamin B-12  1,000 mcg Oral Daily   enoxaparin (LOVENOX) injection  40 mg Subcutaneous Q24H   escitalopram  10 mg Oral Daily   folic acid  1 mg Oral Daily   multivitamin with minerals  1 tablet Oral Daily   mouth rinse  15 mL Mouth Rinse 4 times per day   pantoprazole  40 mg Oral Daily   thiamine  100 mg  Oral Daily   Continuous Infusions: PRN Meds:.acetaminophen **OR** acetaminophen, metoprolol tartrate, mouth rinse  Antimicrobials: Anti-infectives (From admission, onward)    None        I have personally reviewed the following labs and images: CBC: Recent Labs  Lab 08/20/22 1234 08/20/22 1328 08/21/22 1043 08/23/22 0758 08/24/22 0605  WBC  --  5.9 6.0 2.2* 4.2  NEUTROABS  --  4.5 3.3 1.5* 3.0  HGB 13.9 14.0 14.2 14.7 14.9  HCT 41.0 41.2 43.2 42.3 41.3  MCV  --  92.8 94.3 89.1 86.6  PLT  --  110* 93* 102* 104*   BMP &GFR Recent Labs  Lab 08/20/22 1222 08/20/22 1234 08/21/22 1043 08/23/22 0758 08/24/22 0451  NA 133* 135 136 132* 132*  K 4.1 4.3 4.3 3.8 4.3  CL 101  --  104 98 101  CO2 22  --  GLUCOSE 123*  --  80 159* 169*  BUN 14  --  CREATININE 1.23  --  1.20 1.00 1.00  CALCIUM 8.3*  --  8.4* 8.6* 8.4*  MG  --   --  2.0 1.8 2.3  PHOS  --   --  3.2 3.4 2.5   Estimated Creatinine Clearance: 43.6 mL/min (by C-G formula based on SCr of 1 mg/dL). Liver & Pancreas: Recent Labs  Lab 08/20/22 1222 08/21/22 1043 08/23/22 0758 08/24/22 0451  AST 18 26 22 20   ALT 10 9 13 13   ALKPHOS 54 45 46 46  BILITOT 0.6 0.9 0.5 0.8  PROT 6.2* 5.9* 6.0* 6.1*  ALBUMIN 2.9* 2.8* 2.6* 2.7*   No results for input(s): "LIPASE", "AMYLASE" in the last 168 hours. Recent Labs  Lab 08/20/22 1222  AMMONIA 32   Diabetic: No results for input(s): "HGBA1C" in the last 72 hours. Recent Labs  Lab 08/20/22 1333  GLUCAP 147*   Cardiac Enzymes: No results for input(s): "CKTOTAL", "CKMB", "CKMBINDEX", "TROPONINI" in the last 168 hours. No results for input(s): "PROBNP" in the last 8760 hours. Coagulation Profile: No results for input(s): "INR", "PROTIME" in the last 168 hours. Thyroid Function Tests: No results for input(s): "TSH", "T4TOTAL", "FREET4", "T3FREE", "THYROIDAB" in the last 72 hours. Lipid Profile: No results for input(s): "CHOL", "HDL",  "LDLCALC", "TRIG", "CHOLHDL", "LDLDIRECT" in the last 72 hours. Anemia Panel: Recent Labs    08/22/22 1915 08/23/22 1840  FERRITIN 386* 432*   Urine analysis:    Component Value Date/Time   COLORURINE YELLOW 08/20/2022 1435   APPEARANCEUR CLEAR 08/20/2022 1435   LABSPEC 1.023 08/20/2022 1435   PHURINE 5.0 08/20/2022 1435   GLUCOSEU NEGATIVE 08/20/2022 1435   GLUCOSEU NEGATIVE 02/11/2022 0946   HGBUR NEGATIVE 08/20/2022 1435   BILIRUBINUR NEGATIVE 08/20/2022 1435   KETONESUR NEGATIVE 08/20/2022 1435   PROTEINUR NEGATIVE 08/20/2022 1435   UROBILINOGEN 2.0 (A) 02/11/2022 0946   NITRITE NEGATIVE 08/20/2022 1435   LEUKOCYTESUR NEGATIVE 08/20/2022 1435   Sepsis Labs: Invalid input(s): "PROCALCITONIN", "LACTICIDVEN"  Microbiology: Recent Results (from the past 240 hour(s))  Blood culture (routine x 2)     Status: None (Preliminary result)   Collection Time: 08/20/22 12:22 PM   Specimen: BLOOD  Result Value Ref Range Status   Specimen Description BLOOD SITE NOT SPECIFIED  Final   Special Requests   Final    BOTTLES DRAWN AEROBIC AND ANAEROBIC Blood Culture results may not be optimal due to an inadequate volume of blood received in culture bottles   Culture   Final    NO GROWTH 4 DAYS Performed at [facility], [address] [place], [place] [address]    Report Status PENDING  Incomplete  Blood culture (routine x 2)     Status: None (Preliminary result)   Collection Time: 08/20/22 12:30 PM   Specimen: BLOOD RIGHT ARM  Result Value Ref Range Status   Specimen Description BLOOD RIGHT ARM  Final   Special Requests   Final    BOTTLES DRAWN AEROBIC AND ANAEROBIC Blood Culture adequate volume   Culture   Final    NO GROWTH 4 DAYS Performed at [facility], [address], [place], [place] [address]    Report Status PENDING  Incomplete  Resp panel by RT-PCR (RSV, Flu A&B, Covid) Anterior Nasal Swab     Status: Abnormal   Collection Time: 08/20/22  1:24 PM    Specimen: Anterior Nasal Swab  Result Value Ref Range Status   SARS Coronavirus 2 by RT PCR POSITIVE (A) NEGATIVE Final   Influenza A by PCR NEGATIVE NEGATIVE Final   Influenza B by PCR NEGATIVE NEGATIVE Final    Comment: (NOTE) The Xpert Xpress SARS-CoV-2/FLU/RSV plus assay is intended as an aid in the diagnosis of influenza from Nasopharyngeal swab specimens and should not be used as a sole basis for treatment. Nasal washings and aspirates are unacceptable for  Xpert Xpress SARS-CoV-2/FLU/RSV testing.  Fact Sheet for Patients: BloggerCourse.com  Fact Sheet for Healthcare Providers: SeriousBroker.it  This test is not yet approved or cleared by the [place] FDA and has been authorized for detection and/or diagnosis of SARS-CoV-2 by FDA under an Emergency Use Authorization (EUA). This EUA will remain in effect (meaning this test can be used) for the duration of the COVID-19 declaration under Section 564(b)(1) of the Act, 21 U.S.C. section 360bbb-3(b)(1), unless the authorization is terminated or revoked.     Resp Syncytial Virus by PCR NEGATIVE NEGATIVE Final    Comment: (NOTE) Fact Sheet for Patients: BloggerCourse.com  Fact Sheet for Healthcare Providers: SeriousBroker.it  This test is not yet approved or cleared by the [place] FDA and has been authorized for detection and/or diagnosis of SARS-CoV-2 by FDA under an Emergency Use Authorization (EUA). This EUA will remain in effect (meaning this test can be used) for the duration of the COVID-19 declaration under Section 564(b)(1) of the Act, 21 U.S.C. section 360bbb-3(b)(1), unless the authorization is terminated or revoked.  Performed at [facility], [address] [place], [place] [address]   Urine Culture (for pregnant, neutropenic or urologic patients or patients with an indwelling urinary catheter)      Status: Abnormal   Collection Time: 08/20/22  2:35 PM   Specimen: Urine, Clean Catch  Result Value Ref Range Status   Specimen Description URINE, CLEAN CATCH  Final   Special Requests   Final    ADDED 2140 Performed at [facility], [address] [place], [place] [address]    Culture (A)  Final    >=100,000 COLONIES/mL MULTIPLE SPECIES PRESENT, SUGGEST RECOLLECTION   Report Status 08/22/2022 FINAL  Final    Radiology Studies: No results found.    Jalonda Antigua T. Pegah Segel [facility]  If 7PM-7AM, please contact night-coverage [email or web address] 08/24/2022, 4:47 PM

## 2022-08-24 NOTE — TOC Progression Note (Signed)
Transition of Care [facility]) - Progression Note    Patient Details  Name: David [name] MRN: [id number] Date of Birth: 21-Jan-1924  Transition of Care [facility]) CM/SW Contact  Baldemar Lenis, [place] Phone Number: 08/24/2022, 2:20 PM  Clinical Narrative:   CSW heard back from [facility] that they will be able to offer a bed for patient after his isolation is over. CSW spoke with daughter, [name], to update and she said she would update her sister. CSW to follow.    Expected Discharge Plan: Skilled Nursing Facility Barriers to Discharge: Continued Medical Work up, SNF Covid  Expected Discharge Plan and Services     Post Acute Care Choice: Skilled Nursing Facility Living arrangements for the past 2 months: Single Family Home                                       Social Determinants of Health (SDOH) Interventions SDOH Screenings   Food Insecurity: No Food Insecurity (08/20/2022)  Housing: Low Risk  (08/20/2022)  Transportation Needs: No Transportation Needs (08/20/2022)  Utilities: Not At Risk (08/20/2022)  Alcohol Screen: Low Risk  (02/18/2022)  Depression (PHQ2-9): Medium Risk (06/14/2022)  Financial Resource Strain: Low Risk  (02/18/2022)  Physical Activity: Inactive (02/18/2022)  Social Connections: Socially Isolated (02/18/2022)  Stress: No Stress Concern Present (02/18/2022)  Tobacco Use: Medium Risk (08/22/2022)    Readmission Risk Interventions     No data to display

## 2022-08-24 NOTE — Progress Notes (Signed)
Speech Language Pathology Treatment: Dysphagia  Patient Details Name: David [name] MRN: [id number] DOB: 04/24/1924 Today's Date: 08/24/2022 Time: 1040-1101 SLP Time Calculation (min) (ACUTE ONLY): 21 min  Assessment / Plan / Recommendation Clinical Impression  Pt seen for ongoing dysphagia management. Daughter present today.  Reviewed MBSS imaging with daughter and shared recommendations.  Pt usually drinks from a straw, needs lid because he is shaky.  Recommended considering a handled cup with lid, like a travel coffee mug.  Alternatively, pt is 66 and has been tolerating straw sips of liquid without developing pneumonia and if straws are preferred they could be continued with known risk.  Cued throat clear may be an effective strategy if straws are used.  Shared this information with daughter to make decisions about PO intake with family.  Daughter very receptive and verbalized understanding.  Pt has no further ST needs for dysphagia.  SLP will sign off for swallowing.  Recommend continuing mechanical soft diet with thin liquids.  Recommend cup sips and avoiding straws.  If straw is used, encourage intermittent throat clear.    HPI HPI: David [name] is a 89 y.o. male who presented to ED with complaints of speech difficulty worse than baseline. Found to be COVID+. Family reports decreased PO intake.  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 Hx of silent aspiraiton per MBS x2 in 2021 with CIR following CVA.      SLP Plan  Discharge SLP treatment due to (comment);All goals met      Recommendations for follow up therapy are one component of a multi-disciplinary discharge planning process, led by the attending physician.  Recommendations may be updated based on patient status, additional functional criteria and insurance authorization.    Recommendations  Diet recommendations:  Dysphagia 3 (mechanical soft);Thin liquid Liquids provided via: Cup;No straw (throat clear intermittently if using straw) Compensations: Slow rate;Small sips/bites;Minimize environmental distractions;Clear throat intermittently Postural Changes and/or Swallow Maneuvers: Seated upright 90 degrees;Upright 30-60 min after meal                  Oral care BID   None Dysphagia, oropharyngeal phase (R13.12)     Discharge SLP treatment due to (comment);All goals met     Kerrie Pleasure, MA, CCC-SLP Acute Rehabilitation Services Office: [phone] 08/24/2022, 11:05 AM

## 2022-08-24 NOTE — TOC Initial Note (Signed)
Transition of Care [facility]) - Initial/Assessment Note    Patient Details  Name: David [name] MRN: [id number] Date of Birth: February 25, 1924  Transition of Care [facility]) CM/SW Contact:    David Lenis, LCSW Phone Number: 08/24/2022, 2:19 PM  Clinical Narrative:       CSW spoke with daughter, David [name], to discuss recommendation for SNF placement. David [name] in agreement, but only if certain SNF will offer a bed as the patient had been to a SNF in the past and did worse while he was there. David [name] interested in [facility], if possible, or the [place] SNF. CSW sent referral to [facility], they will review. CSW discussed with David [name] that patient may have to stay in the hospital until his COVID isolation is over, and [place] indicated understanding. CSW to follow.            Expected Discharge Plan: Skilled Nursing Facility Barriers to Discharge: Continued Medical Work up, SNF Covid   Patient Goals and CMS Choice Patient states their goals for this hospitalization and ongoing recovery are:: patient unable to participate in goal setting, not fully oriented CMS Medicare.gov Compare Post Acute Care list provided to:: Patient Represenative (must comment) Choice offered to / list presented to : Adult Children [place] ownership interest in [facility].provided to:: Adult Children    Expected Discharge Plan and Services     Post Acute Care Choice: Skilled Nursing Facility Living arrangements for the past 2 months: Single Family Home                                      Prior Living Arrangements/Services Living arrangements for the past 2 months: Single Family Home Lives with:: Adult Children Patient language and need for interpreter reviewed:: No Do you feel safe going back to the place where you live?: Yes      Need for Family Participation in Patient Care: Yes (Comment) Care giver support system in place?: Yes (comment)   Criminal Activity/Legal Involvement Pertinent to Current  Situation/Hospitalization: No - Comment as needed  Activities of Daily Living Home Assistive Devices/Equipment: Eyeglasses ADL Screening (condition at time of admission) Patient's cognitive ability adequate to safely complete daily activities?: No Is the patient deaf or have difficulty hearing?: Yes Does the patient have difficulty seeing, even when wearing glasses/contacts?: No Does the patient have difficulty concentrating, remembering, or making decisions?: Yes Patient able to express need for assistance with ADLs?: No Does the patient have difficulty dressing or bathing?: Yes Independently performs ADLs?: No Communication: Dependent Is this a change from baseline?: Change from baseline, expected to last <3 days Dressing (OT): Needs assistance Is this a change from baseline?: Pre-admission baseline Grooming: Needs assistance Is this a change from baseline?: Pre-admission baseline Feeding: Needs assistance Is this a change from baseline?: Pre-admission baseline Bathing: Needs assistance Is this a change from baseline?: Pre-admission baseline Toileting: Needs assistance Is this a change from baseline?: Pre-admission baseline In/Out Bed: Needs assistance Is this a change from baseline?: Pre-admission baseline Walks in Home: Needs assistance Is this a change from baseline?: Pre-admission baseline Does the patient have difficulty walking or climbing stairs?: Yes Weakness of Legs: Right Weakness of Arms/Hands: Right  Permission Sought/Granted Permission sought to share information with : Facility Medical sales representative, Family Supports Permission granted to share information with : Yes, Verbal Permission Granted  Share Information with NAME: David [name]  Permission granted to share info w  AGENCY: SNF  Permission granted to share info w Relationship: Daughters     Emotional Assessment   Attitude/Demeanor/Rapport: Unable to Assess Affect (typically observed): Unable to  Assess Orientation: : Oriented to Self Alcohol / Substance Use: Not Applicable Psych Involvement: No (comment)  Admission diagnosis:  Acute encephalopathy [G93.40] COVID-19 virus infection [U07.1] Patient Active Problem List   Diagnosis Date Noted   Acute encephalopathy 08/20/2022   COVID-19 virus infection 08/20/2022   Weakness 08/20/2022   Intertrigo 02/10/2022   Aortic valve disorder 08/17/2021   Chronic ischemic heart disease 08/17/2021   Foot drop, right foot 08/17/2021   Hemiplegia and hemiparesis following cerebral infarction affecting right dominant side 08/17/2021   Impacted cerumen, right ear 08/17/2021   Nail dystrophy 08/17/2021   Need for assistance with personal care 08/17/2021   Other specified problems related to psychosocial circumstances 08/17/2021   Pressure ulcer of unspecified buttock, unspecified stage 08/17/2021   Atherosclerosis of aorta 09/28/2020   Adhesive capsulitis of right shoulder 08/19/2020   Situational anxiety 06/29/2020   Hyperkeratosis 01/29/2020   Polycythemia 01/03/2020   Elevated LFTs 01/03/2020   Spastic hemiparesis of right dominant side 10/30/2019   Mass of heart valve 10/17/2019   Depression, major, single episode, moderate 10/17/2019   Cognitive and neurobehavioral dysfunction    Labile blood pressure    Dysphagia, post-stroke    AKI (acute kidney injury)    Hyperglycemia    Coronary artery disease involving native coronary artery of native heart without angina pectoris    Chronic obstructive pulmonary disease    Tremors of nervous system    S/P AVR    Thrombocytopenia    Global aphasia    ICH (intracerebral hemorrhage) 09/04/2019   Tremor 05/20/2019   Pulmonary valve vegetation 09/20/2018   Chest pain, atypical 09/20/2018   Onychomycosis 06/06/2018   Acute pain of left knee 08/18/2017   Wart of hand 06/05/2017   Acute upper respiratory infection 05/12/2016   Skin irritation from shaving 12/02/2015   Benign paroxysmal  positional vertigo 03/09/2015   Plantar fasciitis, bilateral 12/02/2014   Constipation 07/17/2014   Cough 04/09/2014   S/P AVR (aortic valve replacement) 11/04/2013   Elevated hemoglobin (HCC) 12/14/2012   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   Impaired glucose tolerance 02/14/2011   Edema 10/22/2010   Actinic keratoses 10/22/2010   THROMBOCYTOPENIA 01/22/2010   MELANOMA 03/05/2009   TOBACCO USE, QUIT 02/27/2009   Neoplasm of uncertain behavior of skin 05/08/2007   Dyslipidemia 02/08/2007   Essential hypertension 02/08/2007   Coronary atherosclerosis 02/08/2007   COPD (chronic obstructive pulmonary disease) 02/08/2007   GERD 02/08/2007   Osteoarthritis 02/08/2007   LOW BACK PAIN 02/08/2007   Rash 01/29/2007   PCP:  Tresa GarterPlotnikov, Aleksei V, MD Pharmacy:   [id number] [place] - [id number] [place] [id number] Phone: [phone] Fax: [phone]  [id number] [place] - [address] [place] [id number] Phone: [phone] Fax: [phone]  [facility] [place], [place] - [address] [place] [id number] Phone: [phone] Fax: [phone]     Social Determinants of Health (SDOH) Social History: SDOH Screenings   Food Insecurity: No Food Insecurity (08/20/2022)  Housing: Low Risk  (08/20/2022)  Transportation Needs: No Transportation Needs (08/20/2022)  Utilities: Not At Risk (08/20/2022)  Alcohol Screen: Low Risk  (02/18/2022)  Depression (PHQ2-9): Medium Risk (06/14/2022)  Financial Resource Strain: Low Risk  (02/18/2022)  Physical Activity: Inactive (02/18/2022)  Social Connections: Socially Isolated (02/18/2022)  Stress: No Stress Concern Present (02/18/2022)  Tobacco Use: Medium Risk (08/22/2022)   SDOH Interventions: Food Insecurity Interventions: Intervention Not Indicated Housing Interventions: Intervention Not  Indicated Transportation Interventions: Intervention Not Indicated Utilities Interventions: Intervention Not Indicated   Readmission Risk Interventions     No data to display

## 2022-08-25 DIAGNOSIS — U071 COVID-19: Secondary | ICD-10-CM | POA: Diagnosis not present

## 2022-08-25 DIAGNOSIS — G9340 Encephalopathy, unspecified: Secondary | ICD-10-CM | POA: Diagnosis not present

## 2022-08-25 DIAGNOSIS — I69922 Dysarthria following unspecified cerebrovascular disease: Secondary | ICD-10-CM

## 2022-08-25 DIAGNOSIS — D696 Thrombocytopenia, unspecified: Secondary | ICD-10-CM | POA: Diagnosis not present

## 2022-08-25 DIAGNOSIS — I2510 Atherosclerotic heart disease of native coronary artery without angina pectoris: Secondary | ICD-10-CM | POA: Diagnosis not present

## 2022-08-25 LAB — CBC
HCT: 41.2 % (ref 39.0–52.0)
Hemoglobin: 14.4 g/dL (ref 13.0–17.0)
MCH: 30.5 pg (ref 26.0–34.0)
MCHC: 35 g/dL (ref 30.0–36.0)
MCV: 87.3 fL (ref 80.0–100.0)
Platelets: 100 10*3/uL — ABNORMAL LOW (ref 150–400)
RBC: 4.72 MIL/uL (ref 4.22–5.81)
RDW: 12.8 % (ref 11.5–15.5)
WBC: 6.2 10*3/uL (ref 4.0–10.5)
nRBC: 0 % (ref 0.0–0.2)

## 2022-08-25 LAB — CULTURE, BLOOD (ROUTINE X 2): Culture: NO GROWTH

## 2022-08-25 NOTE — Progress Notes (Signed)
PROGRESS NOTE  David [name] GQB:[id number] DOB: Apr 02, 1924   PCP: Tresa Garter, MD  Patient is from: Home.  Lives with daughter.    DOA: 08/20/2022 LOS: 4  Chief complaints No chief complaint on file.    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  MRI brain without acute finding.  He tested positive for COVID-19.  No oxygen requirement.  CXR showed small layering left pleural effusion with mild streaky left basilar opacity related to atelectasis.  Briefly started on steroid.  Medically stable for discharge but remains physically deconditioned compared to his baseline.  Waiting on SNF which is complicated by COVID infection.   Subjective: Seen and examined earlier this morning.  No major events overnight of this morning.  Somewhat confused and disoriented but calm.  No complaints.  Objective: Vitals:   08/24/22 2146 08/25/22 0118 08/25/22 0524 08/25/22 0843  BP: 136/71 (!) 140/72 (!) 143/70 (!) 147/66  Pulse: 61 (!) 59 (!) 57 62  Resp: 18 16 16 18   Temp: 97.9 F (36.6 C) (!) 97.5 F (36.4 C) 97.7 F (36.5 C) 98.3 F (36.8 C)  TempSrc: Oral Oral Oral Oral  SpO2: 95% 95% 100% 97%  Weight:      Height:        Examination:  GENERAL: No apparent distress.  Nontoxic. HEENT: MMM.  Vision and hearing grossly intact.  NECK: Supple.  No apparent JVD.  RESP:  No IWOB.  Fair aeration bilaterally. CVS:  RRR. Heart sounds normal.  ABD/GI/GU: BS+. Abd soft, NTND.  MSK/EXT:   No apparent deformity. Moves extremities. No edema.  SKIN: no apparent skin lesion or wound NEURO: Awake and alert.  Disoriented.  Right hemiparesis, 2+/5.  Some dysarthria. PSYCH: Calm. Normal affect.   Procedures:  None  Microbiology  summarized: 4/6-COVID-19 PCR positive. 4/6-urine culture with > 100,000 colonies but multiple species. 4/6-blood cultures NGTD  Assessment and plan: Principal Problem:   Acute encephalopathy Active Problems:   COVID-19 virus infection   Weakness   THROMBOCYTOPENIA   Hemiplegia and hemiparesis following cerebral infarction affecting right dominant side   Coronary artery disease involving native coronary artery of native heart without angina pectoris   S/P AVR (aortic valve replacement)   Dyslipidemia   Essential hypertension   COPD (chronic obstructive pulmonary disease)   GERD   Depression, major, single episode, moderate  Acute encephalopathy due to COVID-19 infection: COVID-19 PCR positive on 4/6.  Other ends of the workup including MRI brain, ammonia, TSH, and B12 without significant finding.  Urine culture with >100,000 CFU of multiple Species.  Other infectious workup as above.  Low suspicion for seizure.  Disoriented but awake and alert.  Remains physically deconditioned.  No respiratory distress or oxygen requirement. -Steroid discontinued. -Reorientation and delirium precautions -Cleared for regular diet per SLP -Aspiration precaution   COVID-19 Virus infection: Slightly elevated CRP and D-dimer.  No respiratory distress or significant finding on CXR. -Steroid discontinued. -Continue subcu Lovenox for VTE prophylaxis.  High risk for VTE -Monitor platelet. -Continue isolation precaution   Generalized Weakness and Physical Debility worse than baseline.  Likely due to COVID-19 History of CVA with right hemiparesis and dysarthria: MRI brain without acute finding.  -PT/OT-recommended SNF. -Aspirin and Zetia as below   History of CAD/CABG: No anginal symptoms.  Serial troponin negative.  -Continue home Zetia, aspirin  -Hold atenolol due to borderline bradycardia   NSVT: Had 3 beats of V. tach overnight.  Optimize electrolytes -Holding atenolol due to borderline  bradycardia   Aortic stenosis s/p bioprosthetic AVR: Stable.   Dyslipidemia: Statin intolerant.  Last LDL 88. -continue zetia  Essential Hypertension: Normotensive for most part. -Holding home dose atenolol due to borderline bradycardia   Chronic c/o: Stable. -Continue inhalers, maintenance and rescue   Hyponatremia: Mild.  Monitor intermittently.   GERD -Continue PPI.   History of depression: Stable. -Continue Escitalopram 10 mg po Daily      Hypoalbuminemia: Reactive in the setting of acute illness  Leukopenia/thrombocytopenia: Leukopenia resolved.  Thrombocytopenia stable. -Continue monitoring  Inadequate oral intake Body mass index is 25.69 kg/m. Nutrition Problem: Inadequate oral intake Etiology: decreased appetite Signs/Symptoms: per patient/family report Interventions: Refer to RD note for recommendations   DVT prophylaxis:  enoxaparin (LOVENOX) injection 40 mg Start: 08/24/22 0945 SCDs Start: 08/20/22 1615  Code Status: DNR/DNI Family Communication: None at bedside. Level of care: Med-Surg Status is: Inpatient Remains inpatient appropriate because: Placement/generalized weakness   Final disposition: SNF Consultants:  None  35 minutes with more than 50% spent in reviewing records, counseling patient/family and coordinating care.   Sch Meds:  Scheduled Meds:  aspirin  81 mg Oral Daily   vitamin B-12  1,000 mcg Oral Daily   enoxaparin (LOVENOX) injection  40 mg Subcutaneous Q24H   escitalopram  10 mg Oral Daily   folic acid  1 mg Oral Daily   multivitamin with minerals  1 tablet Oral Daily   mouth rinse  15 mL Mouth Rinse 4 times per day   pantoprazole  40 mg Oral Daily   thiamine  100 mg Oral Daily   Continuous Infusions: PRN Meds:.acetaminophen **OR** acetaminophen, metoprolol tartrate, mouth rinse  Antimicrobials: Anti-infectives (From admission, onward)    None        I have personally reviewed the following labs and  images: CBC: Recent Labs  Lab 08/20/22 1328 08/21/22 1043 08/23/22 0758 08/24/22 0605 08/25/22 0417  WBC 5.9 6.0 2.2* 4.2 6.2  NEUTROABS 4.5 3.3 1.5* 3.0  --   HGB 14.0 14.2 14.7 14.9 14.4  HCT 41.2 43.2 42.3 41.3 41.2  MCV 92.8 94.3 89.1 86.6 87.3  PLT 110* 93* 102* 104* 100*   BMP &GFR Recent Labs  Lab 08/20/22 1222 08/20/22 1234 08/21/22 1043 08/23/22 0758 08/24/22 0451  NA 133* 135 136 132* 132*  K 4.1 4.3 4.3 3.8 4.3  CL 101  --  104 98 101  CO2 22  --  23 22 23   GLUCOSE 123*  --  80 159* 169*  BUN 14  --  15 11 14   CREATININE 1.23  --  1.20 1.00 1.00  CALCIUM 8.3*  --  8.4* 8.6* 8.4*  MG  --   --  2.0 1.8 2.3  PHOS  --   --  3.2 3.4 2.5   Estimated Creatinine Clearance: 43.6 mL/min (by C-G formula based on SCr of 1 mg/dL). Liver & Pancreas: Recent Labs  Lab 08/20/22 1222 08/21/22 1043 08/23/22 0758 08/24/22 0451  AST 18 26 22 20   ALT 10 9 13 13   ALKPHOS 54 45 46 46  BILITOT 0.6 0.9 0.5 0.8  PROT 6.2* 5.9* 6.0* 6.1*  ALBUMIN 2.9* 2.8* 2.6* 2.7*   No results for input(s): "LIPASE", "AMYLASE" in the last 168 hours. Recent Labs  Lab 08/20/22 1222  AMMONIA  32   Diabetic: No results for input(s): "HGBA1C" in the last 72 hours. Recent Labs  Lab 08/20/22 1333  GLUCAP 147*   Cardiac Enzymes: No results for input(s): "CKTOTAL", "CKMB", "CKMBINDEX", "TROPONINI" in the last 168 hours. No results for input(s): "PROBNP" in the last 8760 hours. Coagulation Profile: No results for input(s): "INR", "PROTIME" in the last 168 hours. Thyroid Function Tests: No results for input(s): "TSH", "T4TOTAL", "FREET4", "T3FREE", "THYROIDAB" in the last 72 hours. Lipid Profile: No results for input(s): "CHOL", "HDL", "LDLCALC", "TRIG", "CHOLHDL", "LDLDIRECT" in the last 72 hours. Anemia Panel: Recent Labs    08/22/22 1915 08/23/22 1840  FERRITIN 386* 432*   Urine analysis:    Component Value Date/Time   COLORURINE YELLOW 08/20/2022 1435   APPEARANCEUR CLEAR  08/20/2022 1435   LABSPEC 1.023 08/20/2022 1435   PHURINE 5.0 08/20/2022 1435   GLUCOSEU NEGATIVE 08/20/2022 1435   GLUCOSEU NEGATIVE 02/11/2022 0946   HGBUR NEGATIVE 08/20/2022 1435   BILIRUBINUR NEGATIVE 08/20/2022 1435   KETONESUR NEGATIVE 08/20/2022 1435   PROTEINUR NEGATIVE 08/20/2022 1435   UROBILINOGEN 2.0 (A) 02/11/2022 0946   NITRITE NEGATIVE 08/20/2022 1435   LEUKOCYTESUR NEGATIVE 08/20/2022 1435   Sepsis Labs: Invalid input(s): "PROCALCITONIN", "LACTICIDVEN"  Microbiology: Recent Results (from the past 240 hour(s))  Blood culture (routine x 2)     Status: None   Collection Time: 08/20/22 12:22 PM   Specimen: BLOOD  Result Value Ref Range Status   Specimen Description BLOOD SITE NOT SPECIFIED  Final   Special Requests   Final    BOTTLES DRAWN AEROBIC AND ANAEROBIC Blood Culture results may not be optimal due to an inadequate volume of blood received in culture bottles   Culture   Final    NO GROWTH 5 DAYS Performed at [facility], [address], [place], [place] [address]    Report Status 08/25/2022 FINAL  Final  Blood culture (routine x 2)     Status: None   Collection Time: 08/20/22 12:30 PM   Specimen: BLOOD RIGHT ARM  Result Value Ref Range Status   Specimen Description BLOOD RIGHT ARM  Final   Special Requests   Final    BOTTLES DRAWN AEROBIC AND ANAEROBIC Blood Culture adequate volume   Culture   Final    NO GROWTH 5 DAYS Performed at [facility], [address] [place], [place] [address]    Report Status 08/25/2022 FINAL  Final  Resp panel by RT-PCR (RSV, Flu A&B, Covid) Anterior Nasal Swab     Status: Abnormal   Collection Time: 08/20/22  1:24 PM   Specimen: Anterior Nasal Swab  Result Value Ref Range Status   SARS Coronavirus 2 by RT PCR POSITIVE (A) NEGATIVE Final   Influenza A by PCR NEGATIVE NEGATIVE Final   Influenza B by PCR NEGATIVE NEGATIVE Final    Comment: (NOTE) The Xpert Xpress SARS-CoV-2/FLU/RSV plus assay is intended as  an aid in the diagnosis of influenza from Nasopharyngeal swab specimens and should not be used as a sole basis for treatment. Nasal washings and aspirates are unacceptable for Xpert Xpress SARS-CoV-2/FLU/RSV testing.  Fact Sheet for Patients: BloggerCourse.com  Fact Sheet for Healthcare Providers: SeriousBroker.it  This test is not yet approved or cleared by the [place] FDA and has been authorized for detection and/or diagnosis of SARS-CoV-2 by FDA under an Emergency Use Authorization (EUA). This EUA will remain in effect (meaning this test can be used) for the duration of the COVID-19 declaration  under Section 564(b)(1) of the Act, 21 U.S.C. section 360bbb-3(b)(1), unless the authorization is terminated or revoked.     Resp Syncytial Virus by PCR NEGATIVE NEGATIVE Final    Comment: (NOTE) Fact Sheet for Patients: BloggerCourse.com  Fact Sheet for Healthcare Providers: SeriousBroker.it  This test is not yet approved or cleared by the [place] FDA and has been authorized for detection and/or diagnosis of SARS-CoV-2 by FDA under an Emergency Use Authorization (EUA). This EUA will remain in effect (meaning this test can be used) for the duration of the COVID-19 declaration under Section 564(b)(1) of the Act, 21 U.S.C. section 360bbb-3(b)(1), unless the authorization is terminated or revoked.  Performed at [facility], [address] [place], [place] [address]   Urine Culture (for pregnant, neutropenic or urologic patients or patients with an indwelling urinary catheter)     Status: Abnormal   Collection Time: 08/20/22  2:35 PM   Specimen: Urine, Clean Catch  Result Value Ref Range Status   Specimen Description URINE, CLEAN CATCH  Final   Special Requests   Final    ADDED 2140 Performed at [facility], [address] [place], [place] [address]     Culture (A)  Final    >=100,000 COLONIES/mL MULTIPLE SPECIES PRESENT, SUGGEST RECOLLECTION   Report Status 08/22/2022 FINAL  Final    Radiology Studies: No results found.    Farhaan Mabee T. Jacson Rapaport [facility]  If 7PM-7AM, please contact night-coverage [email or web address] 08/25/2022, 12:01 PM

## 2022-08-25 NOTE — Progress Notes (Signed)
Occupational Therapy Treatment Patient Details Name: David [name] MRN: [id number] DOB: Dec 17, 1923 Today's Date: 08/25/2022   History of present illness David [name] is a 89 y.o. male admitted 08/20/22 with speech difficulty worse than baseline, lethargy, diarrhea. CT revealed new Lacunar infarct within the left thalamus, also found to be COVID +. 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   OT comments  Patient pleasant and agreeable to OT session. Patient required tactile and verbal cues to get to EOB with use of LUE to assist. Patient demonstrated BLE knee extension and required assistance to achieve flexion to position [place]. Patient was mod assist to stand from raised bed into [place] and min assist  to stand from [place] pad to lower to recliner. Patient performed grooming and PROM to LUE while in recliner. Patient will benefit from continued inpatient follow up therapy, <3 hours/day. Acute OT to continue to follow.     Recommendations for follow up therapy are one component of a multi-disciplinary discharge planning process, led by the attending physician.  Recommendations may be updated based on patient status, additional functional criteria and insurance authorization.    Assistance Recommended at Discharge Frequent or constant Supervision/Assistance  Patient can return home with the following  Two people to help with walking and/or transfers;A lot of help with bathing/dressing/bathroom;Assistance with cooking/housework;Direct supervision/assist for medications management;Direct supervision/assist for financial management;Assist for transportation;Help with stairs or ramp for entrance   Equipment Recommendations  None recommended by OT    Recommendations for Other Services      Precautions / Restrictions Precautions Precautions: Fall Precaution Comments: R hemi at baseline Required Braces or  Orthoses: Other Brace (ankle brace and shoes to be worn for transfers) Other Brace: R ankle brace from [facility] Restrictions Weight Bearing Restrictions: No       Mobility Bed Mobility Overal bed mobility: Needs Assistance Bed Mobility: Supine to Sit     Supine to sit: Mod assist, HOB elevated     General bed mobility comments: tactile cues and mod assist to use LUE to assist with bed mobility and assistance with BLEs    Transfers Overall transfer level: Needs assistance Equipment used: Ambulation equipment used Transfers: Sit to/from Stand, Bed to chair/wheelchair/BSC Sit to Stand: Mod assist, From elevated surface           General transfer comment: mod assist to stand into stedy from EOB and min assist to stand from [place] pads. Transfer via Lift Equipment: St[place]   Balance Overall balance assessment: Needs assistance Sitting-balance support: Feet supported, Single extremity supported Sitting balance-Leahy Scale: Poor Sitting balance - Comments: initially patient required min to mod assist due to posterior leaning but improved to min guard with increased time and verbal cues and stedy bar Postural control: Posterior lean Standing balance support: Single extremity supported Standing balance-Leahy Scale: Poor Standing balance comment: reliant on LUE support with Stedy bar                           ADL either performed or assessed with clinical judgement   ADL Overall ADL's : Needs assistance/impaired Eating/Feeding: Minimal assistance   Grooming: Wash/dry face;Supervision/safety;Sitting Grooming Details (indicate cue type and reason): in recliner  General ADL Comments: increased weakness overall    Extremity/Trunk Assessment              Vision       Perception     Praxis      Cognition Arousal/Alertness: Awake/alert Behavior During Therapy: WFL for tasks assessed/performed Overall Cognitive Status:  Within Functional Limits for tasks assessed                                 General Comments: difficult to understand and to follow directions        Exercises Exercises: General Upper Extremity General Exercises - Upper Extremity Shoulder Flexion: PROM, Right, 10 reps, Seated Shoulder ABduction: PROM, Right, 10 reps, Seated    Shoulder Instructions       General Comments skin tear noted on Right elbow after transfer to recliner. Nursing notified and addressed    Pertinent Vitals/ Pain       Pain Assessment Pain Assessment: Faces Faces Pain Scale: Hurts little more Pain Location: RUE with ROM Pain Descriptors / Indicators: Discomfort, Guarding Pain Intervention(s): Limited activity within patient's tolerance, Monitored during session, Repositioned  Home Living                                          Prior Functioning/Environment              Frequency  Min 2X/week        Progress Toward Goals  OT Goals(current goals can now be found in the care plan section)  Progress towards OT goals: Progressing toward goals  Acute Rehab OT Goals OT Goal Formulation: Patient unable to participate in goal setting Time For Goal Achievement: 09/04/22 Potential to Achieve Goals: Good ADL Goals Pt Will Perform Eating: with set-up;sitting Pt Will Perform Grooming: with set-up;sitting Pt Will Transfer to Toilet: with min assist Additional ADL Goal #1: Pt will perform seated HEP to maximize strength and activity tolerance for participation in ADL  Plan Discharge plan remains appropriate    Co-evaluation                 AM-PAC OT "6 Clicks" Daily Activity     Outcome Measure   Help from another person eating meals?: A Lot Help from another person taking care of personal grooming?: A Lot Help from another person toileting, which includes using toliet, bedpan, or urinal?: Total Help from another person bathing (including washing,  rinsing, drying)?: A Lot Help from another person to put on and taking off regular upper body clothing?: Total Help from another person to put on and taking off regular lower body clothing?: Total 6 Click Score: 9    End of Session Equipment Utilized During Treatment: Gait belt;Other (comment) Antony Salmon)  OT Visit Diagnosis: Muscle weakness (generalized) (M62.81);Other symptoms and signs involving the nervous system (R29.898)   Activity Tolerance Patient tolerated treatment well   Patient Left in chair;with call bell/phone within reach;with chair alarm set   Nurse Communication Mobility status;Need for lift equipment        Time: 1206-1236 OT Time Calculation (min): 30 min  Charges: OT General Charges $OT Visit: 1 Visit OT Treatments $Self Care/Home Management : 8-22 mins $Therapeutic Activity: 8-22 mins  Alfonse Flavors, OTA Acute Rehabilitation Services  Office [phone]   Dewain Penning 08/25/2022, 2:29 PM

## 2022-08-25 NOTE — Plan of Care (Signed)
  Problem: Coping: Goal: Will identify appropriate support needs Outcome: Progressing   Problem: Nutrition: Goal: Risk of aspiration will decrease Outcome: Progressing Goal: Dietary intake will improve Outcome: Progressing   Problem: Clinical Measurements: Goal: Will remain free from infection Outcome: Progressing Goal: Respiratory complications will improve Outcome: Progressing   Problem: Activity: Goal: Risk for activity intolerance will decrease Outcome: Progressing   Problem: Skin Integrity: Goal: Risk for impaired skin integrity will decrease Outcome: Progressing

## 2022-08-26 DIAGNOSIS — U071 COVID-19: Secondary | ICD-10-CM | POA: Diagnosis not present

## 2022-08-26 DIAGNOSIS — G9340 Encephalopathy, unspecified: Secondary | ICD-10-CM | POA: Diagnosis not present

## 2022-08-26 DIAGNOSIS — I2510 Atherosclerotic heart disease of native coronary artery without angina pectoris: Secondary | ICD-10-CM | POA: Diagnosis not present

## 2022-08-26 DIAGNOSIS — D696 Thrombocytopenia, unspecified: Secondary | ICD-10-CM | POA: Diagnosis not present

## 2022-08-26 LAB — COMPREHENSIVE METABOLIC PANEL
ALT: 16 U/L (ref 0–44)
AST: 21 U/L (ref 15–41)
Albumin: 2.4 g/dL — ABNORMAL LOW (ref 3.5–5.0)
Alkaline Phosphatase: 43 U/L (ref 38–126)
Anion gap: 7 (ref 5–15)
BUN: 14 mg/dL (ref 8–23)
CO2: 26 mmol/L (ref 22–32)
Calcium: 8.2 mg/dL — ABNORMAL LOW (ref 8.9–10.3)
Chloride: 101 mmol/L (ref 98–111)
Creatinine, Ser: 1.12 mg/dL (ref 0.61–1.24)
GFR, Estimated: 60 mL/min — ABNORMAL LOW (ref >60)
Glucose, Bld: 106 mg/dL — ABNORMAL HIGH (ref 70–99)
Potassium: 4.1 mmol/L (ref 3.5–5.1)
Sodium: 134 mmol/L — ABNORMAL LOW (ref 135–145)
Total Bilirubin: 0.8 mg/dL (ref 0.3–1.2)
Total Protein: 5.6 g/dL — ABNORMAL LOW (ref 6.5–8.1)

## 2022-08-26 LAB — CBC
HCT: 41 % (ref 39.0–52.0)
Hemoglobin: 14.1 g/dL (ref 13.0–17.0)
MCH: 30.7 pg (ref 26.0–34.0)
MCHC: 34.4 g/dL (ref 30.0–36.0)
MCV: 89.3 fL (ref 80.0–100.0)
Platelets: 91 10*3/uL — ABNORMAL LOW (ref 150–400)
RBC: 4.59 MIL/uL (ref 4.22–5.81)
RDW: 13 % (ref 11.5–15.5)
WBC: 6.6 10*3/uL (ref 4.0–10.5)
nRBC: 0 % (ref 0.0–0.2)

## 2022-08-26 LAB — VITAMIN B12: Vitamin B-12: 727 pg/mL (ref 180–914)

## 2022-08-26 LAB — TSH: TSH: 1.139 u[IU]/mL (ref 0.350–4.500)

## 2022-08-26 LAB — MAGNESIUM: Magnesium: 1.8 mg/dL (ref 1.7–2.4)

## 2022-08-26 LAB — AMMONIA: Ammonia: 20 umol/L (ref 9–35)

## 2022-08-26 LAB — PHOSPHORUS: Phosphorus: 2.3 mg/dL — ABNORMAL LOW (ref 2.5–4.6)

## 2022-08-26 LAB — CK: Total CK: 76 U/L (ref 49–397)

## 2022-08-26 MED ORDER — SODIUM CHLORIDE 0.9 % IV SOLN: INTRAVENOUS | Status: DC

## 2022-08-26 MED ORDER — SODIUM CHLORIDE 0.9 % IV BOLUS
500.0000 mL | Freq: Once | INTRAVENOUS | Status: AC
  Administered 2022-08-26: 500 mL via INTRAVENOUS

## 2022-08-26 NOTE — Progress Notes (Signed)
Physical Therapy Treatment Patient Details Name: David [name] MRN: [id number] DOB: 11/08/1923 Today's Date: 08/26/2022   History of Present Illness David [name] is a 89 y.o. male admitted 08/20/22 with speech difficulty worse than baseline, lethargy, diarrhea. CT revealed new Lacunar infarct within the left thalamus, also found to be COVID +. 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    PT Comments    Daughter present and feeding pt in bed upon arrival, reporting this is the most awake pt has been and the first time he has eaten in the past 24 hours. Had planned to focus on transfer training and get pt in the recliner for more upright positioning and improved pulmonary function. However, considering the daughter's report, decided to shift the focus of this session to sitting EOB while reaching off COG for pt to feed himself and to improve his activity tolerance and core strength. Pt leaning posteriorly as he fatigued and needing up to minA for sitting balance. Pt requiring mod-maxA for bed mobility this date. Returned pt to supine in bed with bed in chair position and daughter present to continue to assist pt in feeding at end of session. Will continue to follow acutely.     Recommendations for follow up therapy are one component of a multi-disciplinary discharge planning process, led by the attending physician.  Recommendations may be updated based on patient status, additional functional criteria and insurance authorization.  Follow Up Recommendations  Can patient physically be transported by private vehicle: No    Assistance Recommended at Discharge Frequent or constant Supervision/Assistance  Patient can return home with the following Two people to help with walking and/or transfers;A lot of help with bathing/dressing/bathroom;Assistance with cooking/housework;Direct supervision/assist for medications  management;Direct supervision/assist for financial management;Assist for transportation;Help with stairs or ramp for entrance   Equipment Recommendations  None recommended by PT (pt well equipped)    Recommendations for Other Services       Precautions / Restrictions Precautions Precautions: Fall Precaution Comments: R hemi at baseline Required Braces or Orthoses: Other Brace (ankle brace and shoes to be worn for transfers) Other Brace: R ankle brace from [facility] Restrictions Weight Bearing Restrictions: No     Mobility  Bed Mobility Overal bed mobility: Needs Assistance Bed Mobility: Supine to Sit, Sit to Supine     Supine to sit: HOB elevated, Max assist Sit to supine: Mod assist, HOB elevated   General bed mobility comments: Pt needing increased assistance to bring bil legs off L EOB and ascend trunk to sit up this date, maxA. ModA to direct trunk to lean onto his L elbow and to lift his legs with return to supine    Transfers                   General transfer comment: deferred to focus on sitting balance while eating as daughter reports pt has been too lethargic to eat over the last 24 hours    Ambulation/Gait               General Gait Details: unable at baseline   Stairs             Wheelchair Mobility    Modified Rankin (Stroke Patients Only)       Balance Overall balance assessment: Needs assistance Sitting-balance support: Feet supported, Single extremity supported, No upper extremity supported Sitting balance-Leahy Scale: Fair Sitting balance -  Comments: Pt initially holding L bed rail with L UE and leaning posteriorly, needing tactile and verbal cues to place feet on floor (more difficulty with his R) and lean anteriorly, modA initially. Pt progressing to min guard assist with intermittent minA for static and dynamic sitting balance, reaching off COG with L UE to feed himself intermittently. Intermittent posterior lean as pt fatigued.  Sat EOB >20 min Postural control: Posterior lean     Standing balance comment: deferred this date                            Cognition Arousal/Alertness: Awake/alert Behavior During Therapy: WFL for tasks assessed/performed Overall Cognitive Status: Within Functional Limits for tasks assessed                                 General Comments: sometimes hard to understand expressive aphasia, but follows directions with extra time        Exercises General Exercises - Lower Extremity Long Arc Quad: AROM, PROM, Both, 10 reps, Other reps (comment), Seated (AROM L leg x10 reps, PROM x3 reps R leg) Other Exercises Other Exercises: reaching off COG with L UE while sitting EOB >20 min, min guard-minA with posterior lean as he fatigued    General Comments General comments (skin integrity, edema, etc.): pt requesting to return to supine after feeding self sitting EOB due to fatigue, thus deferred transfer training today      Pertinent Vitals/Pain Pain Assessment Pain Assessment: Faces Faces Pain Scale: Hurts a little bit Pain Location: generalized Pain Descriptors / Indicators: Discomfort, Guarding Pain Intervention(s): Monitored during session, Limited activity within patient's tolerance, Repositioned    Home Living                          Prior Function            PT Goals (current goals can now be found in the care plan section) Acute Rehab PT Goals Patient Stated Goal: to improve PT Goal Formulation: With patient/family Time For Goal Achievement: 09/04/22 Potential to Achieve Goals: Fair Progress towards PT goals: Progressing toward goals    Frequency    Min 3X/week      PT Plan Current plan remains appropriate    Co-evaluation              AM-PAC PT "6 Clicks" Mobility   Outcome Measure  Help needed turning from your back to your side while in a flat bed without using bedrails?: A Little Help needed moving from lying  on your back to sitting on the side of a flat bed without using bedrails?: A Lot Help needed moving to and from a bed to a chair (including a wheelchair)?: A Lot Help needed standing up from a chair using your arms (e.g., wheelchair or bedside chair)?: A Lot Help needed to walk in hospital room?: Total Help needed climbing 3-5 steps with a railing? : Total 6 Click Score: 11    End of Session   Activity Tolerance: Patient tolerated treatment well Patient left: with call bell/phone within reach;in bed;with bed alarm set;with family/visitor present   PT Visit Diagnosis: Muscle weakness (generalized) (M62.81);Other abnormalities of gait and mobility (R26.89);Unsteadiness on feet (R26.81)     Time: 1350-1420 PT Time Calculation (min) (ACUTE ONLY): 30 min  Charges:  $Therapeutic Exercise: 8-22 mins $Therapeutic  Activity: 8-22 mins                  Raymond GurneyPettis, PT, DPT Acute Rehabilitation Services  Office: [phone]    Jewel Baize 08/26/2022, 4:54 PM

## 2022-08-26 NOTE — Progress Notes (Signed)
Name: David [name] DOB: [birth year]  Please be advised that the above-named patient will require a short-term nursing home stay -- anticipated 30 days or less for rehabilitation and strengthening. The plan is for return home.

## 2022-08-26 NOTE — Plan of Care (Signed)
  Problem: Health Behavior/Discharge Planning: Goal: Goals will be collaboratively established with patient/family Outcome: Progressing

## 2022-08-26 NOTE — Progress Notes (Signed)
PROGRESS NOTE  David [name] GXQ:[id number] DOB: 21-Apr-1924   PCP: Tresa Garter, MD  Patient is from: Home.  Lives with daughter.    DOA: 08/20/2022 LOS: 5  Chief complaints No chief complaint on file.    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  MRI brain without acute finding.  He tested positive for COVID-19.  No oxygen requirement.  CXR showed small layering left pleural effusion with mild streaky left basilar opacity related to atelectasis.  Briefly started on steroid.  Medically stable for discharge but remains physically deconditioned compared to his baseline.  Discharge to SNF once he completes isolation for COVID-19.  Tested positive on 4/6.   Subjective: Seen and examined earlier this morning.  No major events overnight.  He is somewhat confused and less coherent today.  Feels hot but not febrile.  Also feels fatigued.  Per daughter, his speech is garbled.  Objective: Vitals:   08/25/22 2046 08/25/22 2358 08/26/22 0337 08/26/22 0906  BP: (!) 104/49 111/65 121/63 118/66  Pulse: 73 68 84 73  Resp: 17   16  Temp: 98.3 F (36.8 C) 98.2 F (36.8 C) 98.3 F (36.8 C) 98.3 F (36.8 C)  TempSrc: Oral Oral Oral Oral  SpO2: 95% 95% 90% 96%  Weight:      Height:        Examination:  GENERAL: Somewhat fatigued.  Nontoxic. HEENT: MMM.  Vision and hearing grossly intact.  NECK: Supple.  No apparent JVD.  RESP:  No IWOB.  Fair aeration bilaterally. CVS:  RRR. Heart sounds normal.  ABD/GI/GU: BS+. Abd soft, NTND.  MSK/EXT:   No apparent deformity. Moves extremities. No edema.  SKIN: New small skin laceration to right forearm. NEURO: Awake not alert.  Confused.  Right hemiparesis, 2+/5.  Dysarthria. PSYCH: Calm.  Normal affect.   Procedures:  None  Microbiology summarized: 4/6-COVID-19 PCR positive. 4/6-urine culture with > 100,000 colonies but multiple species. 4/6-blood cultures NGTD  Assessment and plan: Principal Problem:   Acute encephalopathy Active Problems:   COVID-19 virus infection   Weakness   THROMBOCYTOPENIA   Hemiplegia and hemiparesis following cerebral infarction affecting right dominant side   Coronary artery disease involving native coronary artery of native heart without angina pectoris   S/P AVR (aortic valve replacement)   Dyslipidemia   Essential hypertension   COPD (chronic obstructive pulmonary disease)   GERD   Depression, major, single episode, moderate  Acute encephalopathy due to COVID-19 infection/delirium: COVID-19 PCR positive on 4/6.  Other ends of the workup including MRI brain, ammonia, TSH, and B12 without significant finding.  Urine culture with >100,000 CFU of multiple Species.  Other infectious workup as above.  Low suspicion for seizure.  More confused, disoriented and dysarthric.  Less alert.  Suspect delirium. -Reorientation and delirium precautions -Repeat basic and encephalopathy labs -IV fluid bolus followed by maintenance -Aspiration precaution   COVID-19 Virus infection: Slightly elevated CRP and D-dimer.  No respiratory distress or significant finding on CXR. -Continue subcu Lovenox for VTE prophylaxis.  High risk for VTE -Monitor platelet. -Continue isolation precaution   Generalized Weakness and Physical Debility worse than baseline.  Likely due to COVID-19 History of CVA with right hemiparesis and dysarthria: MRI brain without acute finding.  -PT/OT-recommended SNF. -Aspirin and Zetia as below  History of CAD/CABG: No anginal symptoms.  Serial troponin negative.  -Continue home Zetia, aspirin  -Hold atenolol due to borderline bradycardia   NSVT: Had 3 beats of V. tach overnight.  Optimize electrolytes -Holding atenolol due to  borderline bradycardia   Aortic stenosis s/p bioprosthetic AVR: Stable.   Dyslipidemia: Statin intolerant.  Last LDL 88. -continue zetia  Essential Hypertension: Normotensive for most part. -Holding home dose atenolol due to borderline bradycardia   Chronic c/o: Stable. -Continue inhalers, maintenance and rescue   Hyponatremia: Mild.  Monitor intermittently.   GERD -Continue PPI.   History of depression: Stable. -Continue Escitalopram 10 mg po Daily      Hypoalbuminemia: Reactive in the setting of acute illness  Leukopenia/thrombocytopenia: Leukopenia resolved.  Thrombocytopenia stable. -Continue monitoring  Inadequate oral intake Body mass index is 25.69 kg/m. Nutrition Problem: Inadequate oral intake Etiology: decreased appetite Signs/Symptoms: per patient/family report Interventions: Refer to RD note for recommendations   DVT prophylaxis:  enoxaparin (LOVENOX) injection 40 mg Start: 08/24/22 0945 SCDs Start: 08/20/22 1615  Code Status: DNR/DNI Family Communication: None at bedside. Level of care: Med-Surg Status is: Inpatient Remains inpatient appropriate because: Encephalopathy/delirium and physical deconditioning   Final disposition: SNF Consultants:  None  35 minutes with more than 50% spent in reviewing records, counseling patient/family and coordinating care.   Sch Meds:  Scheduled Meds:  aspirin  81 mg Oral Daily   vitamin B-12  1,000 mcg Oral Daily   enoxaparin (LOVENOX) injection  40 mg Subcutaneous Q24H   escitalopram  10 mg Oral Daily   folic acid  1 mg Oral Daily   multivitamin with minerals  1 tablet Oral Daily   mouth rinse  15 mL Mouth Rinse 4 times per day   pantoprazole  40 mg Oral Daily   thiamine  100 mg Oral Daily   Continuous Infusions:  sodium chloride     PRN Meds:.acetaminophen **OR** acetaminophen, metoprolol tartrate, mouth rinse  Antimicrobials: Anti-infectives (From admission, onward)    None        I have  personally reviewed the following labs and images: CBC: Recent Labs  Lab 08/20/22 1328 08/21/22 1043 08/23/22 0758 08/24/22 0605 08/25/22 0417  WBC 5.9 6.0 2.2* 4.2 6.2  NEUTROABS 4.5 3.3 1.5* 3.0  --   HGB 14.0 14.2 14.7 14.9 14.4  HCT 41.2 43.2 42.3 41.3 41.2  MCV 92.8 94.3 89.1 86.6 87.3  PLT 110* 93* 102* 104* 100*   BMP &GFR Recent Labs  Lab 08/20/22 1222 08/20/22 1234 08/21/22 1043 08/23/22 0758 08/24/22 0451  NA 133* 135 136 132* 132*  K 4.1 4.3 4.3 3.8 4.3  CL 101  --  104 98 101  CO2 22  --  GLUCOSE 123*  --  80 159* 169*  BUN 14  --  CREATININE 1.23  --  1.20 1.00 1.00  CALCIUM 8.3*  --  8.4* 8.6* 8.4*  MG  --   --  2.0 1.8 2.3  PHOS  --   --  3.2 3.4 2.5   Estimated Creatinine Clearance: 43.6 mL/min (by C-G formula based on SCr of 1 mg/dL). Liver & Pancreas: Recent Labs  Lab 08/20/22 1222 08/21/22 1043 08/23/22 0758 08/24/22 0451  AST ALT ALKPHOS 54 45 46 46  BILITOT 0.6 0.9 0.5 0.8  PROT 6.2* 5.9* 6.0* 6.1*  ALBUMIN 2.9* 2.8* 2.6* 2.7*   No results for  input(s): "LIPASE", "AMYLASE" in the last 168 hours. Recent Labs  Lab 08/20/22 1222  AMMONIA 32   Diabetic: No results for input(s): "HGBA1C" in the last 72 hours. Recent Labs  Lab 08/20/22 1333  GLUCAP 147*   Cardiac Enzymes: No results for input(s): "CKTOTAL", "CKMB", "CKMBINDEX", "TROPONINI" in the last 168 hours. No results for input(s): "PROBNP" in the last 8760 hours. Coagulation Profile: No results for input(s): "INR", "PROTIME" in the last 168 hours. Thyroid Function Tests: No results for input(s): "TSH", "T4TOTAL", "FREET4", "T3FREE", "THYROIDAB" in the last 72 hours. Lipid Profile: No results for input(s): "CHOL", "HDL", "LDLCALC", "TRIG", "CHOLHDL", "LDLDIRECT" in the last 72 hours. Anemia Panel: Recent Labs    08/23/22 1840  FERRITIN 432*   Urine analysis:    Component Value Date/Time   COLORURINE YELLOW 08/20/2022 1435    APPEARANCEUR CLEAR 08/20/2022 1435   LABSPEC 1.023 08/20/2022 1435   PHURINE 5.0 08/20/2022 1435   GLUCOSEU NEGATIVE 08/20/2022 1435   GLUCOSEU NEGATIVE 02/11/2022 0946   HGBUR NEGATIVE 08/20/2022 1435   BILIRUBINUR NEGATIVE 08/20/2022 1435   KETONESUR NEGATIVE 08/20/2022 1435   PROTEINUR NEGATIVE 08/20/2022 1435   UROBILINOGEN 2.0 (A) 02/11/2022 0946   NITRITE NEGATIVE 08/20/2022 1435   LEUKOCYTESUR NEGATIVE 08/20/2022 1435   Sepsis Labs: Invalid input(s): "PROCALCITONIN", "LACTICIDVEN"  Microbiology: Recent Results (from the past 240 hour(s))  Blood culture (routine x 2)     Status: None   Collection Time: 08/20/22 12:22 PM   Specimen: BLOOD  Result Value Ref Range Status   Specimen Description BLOOD SITE NOT SPECIFIED  Final   Special Requests   Final    BOTTLES DRAWN AEROBIC AND ANAEROBIC Blood Culture results may not be optimal due to an inadequate volume of blood received in culture bottles   Culture   Final    NO GROWTH 5 DAYS Performed at [facility], [address] [place], [place] [address]    Report Status 08/25/2022 FINAL  Final  Blood culture (routine x 2)     Status: None   Collection Time: 08/20/22 12:30 PM   Specimen: BLOOD RIGHT ARM  Result Value Ref Range Status   Specimen Description BLOOD RIGHT ARM  Final   Special Requests   Final    BOTTLES DRAWN AEROBIC AND ANAEROBIC Blood Culture adequate volume   Culture   Final    NO GROWTH 5 DAYS Performed at [facility], [address], [place], [place] [address]    Report Status 08/25/2022 FINAL  Final  Resp panel by RT-PCR (RSV, Flu A&B, Covid) Anterior Nasal Swab     Status: Abnormal   Collection Time: 08/20/22  1:24 PM   Specimen: Anterior Nasal Swab  Result Value Ref Range Status   SARS Coronavirus 2 by RT PCR POSITIVE (A) NEGATIVE Final   Influenza A by PCR NEGATIVE NEGATIVE Final   Influenza B by PCR NEGATIVE NEGATIVE Final    Comment: (NOTE) The Xpert Xpress SARS-CoV-2/FLU/RSV  plus assay is intended as an aid in the diagnosis of influenza from Nasopharyngeal swab specimens and should not be used as a sole basis for treatment. Nasal washings and aspirates are unacceptable for Xpert Xpress SARS-CoV-2/FLU/RSV testing.  Fact Sheet for Patients: BloggerCourse.com  Fact Sheet for Healthcare Providers: SeriousBroker.it  This test is not yet approved or cleared by the [place] FDA and has been authorized for detection and/or diagnosis of SARS-CoV-2 by FDA under an Emergency Use Authorization (EUA). This EUA will remain in effect (  meaning this test can be used) for the duration of the COVID-19 declaration under Section 564(b)(1) of the Act, 21 U.S.C. section 360bbb-3(b)(1), unless the authorization is terminated or revoked.     Resp Syncytial Virus by PCR NEGATIVE NEGATIVE Final    Comment: (NOTE) Fact Sheet for Patients: BloggerCourse.com  Fact Sheet for Healthcare Providers: SeriousBroker.it  This test is not yet approved or cleared by the [place] FDA and has been authorized for detection and/or diagnosis of SARS-CoV-2 by FDA under an Emergency Use Authorization (EUA). This EUA will remain in effect (meaning this test can be used) for the duration of the COVID-19 declaration under Section 564(b)(1) of the Act, 21 U.S.C. section 360bbb-3(b)(1), unless the authorization is terminated or revoked.  Performed at [facility], [address] [place], [place] [address]   Urine Culture (for pregnant, neutropenic or urologic patients or patients with an indwelling urinary catheter)     Status: Abnormal   Collection Time: 08/20/22  2:35 PM   Specimen: Urine, Clean Catch  Result Value Ref Range Status   Specimen Description URINE, CLEAN CATCH  Final   Special Requests   Final    ADDED 2140 Performed at [facility], [address],  [place], [place] [address]    Culture (A)  Final    >=100,000 COLONIES/mL MULTIPLE SPECIES PRESENT, SUGGEST RECOLLECTION   Report Status 08/22/2022 FINAL  Final    Radiology Studies: No results found.    Bryan Omura T. Briyan Kleven [facility]  If 7PM-7AM, please contact night-coverage [email or web address] 08/26/2022, 12:55 PM

## 2022-08-26 NOTE — Plan of Care (Signed)
  Problem: Education: Goal: Knowledge of disease or condition will improve Outcome: Not Progressing Goal: Knowledge of secondary prevention will improve (MUST DOCUMENT ALL) Outcome: Not Progressing Goal: Knowledge of patient specific risk factors will improve Loraine Leriche N/A or DELETE if not current risk factor) Outcome: Not Progressing   Problem: Ischemic Stroke/TIA Tissue Perfusion: Goal: Complications of ischemic stroke/TIA will be minimized Outcome: Not Progressing   Problem: Coping: Goal: Will verbalize positive feelings about self Outcome: Not Progressing Goal: Will identify appropriate support needs Outcome: Not Progressing   Problem: Health Behavior/Discharge Planning: Goal: Ability to manage health-related needs will improve Outcome: Not Progressing Goal: Goals will be collaboratively established with patient/family Outcome: Not Progressing   Problem: Self-Care: Goal: Ability to participate in self-care as condition permits will improve Outcome: Not Progressing Goal: Verbalization of feelings and concerns over difficulty with self-care will improve Outcome: Not Progressing Goal: Ability to communicate needs accurately will improve Outcome: Not Progressing   Problem: Nutrition: Goal: Risk of aspiration will decrease Outcome: Not Progressing Goal: Dietary intake will improve Outcome: Not Progressing   Problem: Education: Goal: Knowledge of risk factors and measures for prevention of condition will improve Outcome: Not Progressing   Problem: Coping: Goal: Psychosocial and spiritual needs will be supported Outcome: Not Progressing   Problem: Respiratory: Goal: Will maintain a patent airway Outcome: Not Progressing Goal: Complications related to the disease process, condition or treatment will be avoided or minimized Outcome: Not Progressing   Problem: Education: Goal: Knowledge of General Education information will improve Description: Including pain rating scale,  medication(s)/side effects and non-pharmacologic comfort measures Outcome: Not Progressing   Problem: Health Behavior/Discharge Planning: Goal: Ability to manage health-related needs will improve Outcome: Not Progressing   Problem: Clinical Measurements: Goal: Ability to maintain clinical measurements within normal limits will improve Outcome: Not Progressing Goal: Will remain free from infection Outcome: Not Progressing Goal: Diagnostic test results will improve Outcome: Not Progressing Goal: Respiratory complications will improve Outcome: Not Progressing Goal: Cardiovascular complication will be avoided Outcome: Not Progressing   Problem: Activity: Goal: Risk for activity intolerance will decrease Outcome: Not Progressing   Problem: Nutrition: Goal: Adequate nutrition will be maintained Outcome: Not Progressing   Problem: Coping: Goal: Level of anxiety will decrease Outcome: Not Progressing   Problem: Elimination: Goal: Will not experience complications related to bowel motility Outcome: Not Progressing Goal: Will not experience complications related to urinary retention Outcome: Not Progressing   Problem: Pain Managment: Goal: General experience of comfort will improve Outcome: Not Progressing   Problem: Safety: Goal: Ability to remain free from injury will improve Outcome: Not Progressing   Problem: Skin Integrity: Goal: Risk for impaired skin integrity will decrease Outcome: Not Progressing

## 2022-08-27 DIAGNOSIS — D696 Thrombocytopenia, unspecified: Secondary | ICD-10-CM | POA: Diagnosis not present

## 2022-08-27 DIAGNOSIS — I2510 Atherosclerotic heart disease of native coronary artery without angina pectoris: Secondary | ICD-10-CM | POA: Diagnosis not present

## 2022-08-27 DIAGNOSIS — L8990 Pressure ulcer of unspecified site, unspecified stage: Secondary | ICD-10-CM | POA: Insufficient documentation

## 2022-08-27 DIAGNOSIS — U071 COVID-19: Secondary | ICD-10-CM | POA: Diagnosis not present

## 2022-08-27 DIAGNOSIS — Z7189 Other specified counseling: Secondary | ICD-10-CM

## 2022-08-27 DIAGNOSIS — Z515 Encounter for palliative care: Secondary | ICD-10-CM

## 2022-08-27 DIAGNOSIS — G9340 Encephalopathy, unspecified: Secondary | ICD-10-CM | POA: Diagnosis not present

## 2022-08-27 MED ORDER — BIOTENE DRY MOUTH MT LIQD: 15.0000 mL | OROMUCOSAL | Status: DC | PRN

## 2022-08-27 MED ORDER — ONDANSETRON HCL 4 MG/2ML IJ SOLN: 4.0000 mg | Freq: Four times a day (QID) | INTRAMUSCULAR | Status: DC | PRN

## 2022-08-27 MED ORDER — ONDANSETRON 4 MG PO TBDP: 4.0000 mg | ORAL_TABLET | Freq: Four times a day (QID) | ORAL | Status: DC | PRN

## 2022-08-27 MED ORDER — POLYVINYL ALCOHOL 1.4 % OP SOLN: 1.0000 [drp] | Freq: Four times a day (QID) | OPHTHALMIC | Status: DC | PRN

## 2022-08-27 MED ORDER — MORPHINE SULFATE (PF) 2 MG/ML IV SOLN: 1.0000 mg | INTRAVENOUS | Status: DC | PRN

## 2022-08-27 MED ORDER — LORAZEPAM 2 MG/ML IJ SOLN: 0.5000 mg | INTRAMUSCULAR | Status: DC | PRN

## 2022-08-27 MED ORDER — GLYCOPYRROLATE 0.2 MG/ML IJ SOLN: 0.4000 mg | INTRAMUSCULAR | Status: DC | PRN

## 2022-08-27 NOTE — Consult Note (Signed)
Palliative Medicine Inpatient Consult Note  Consulting Provider: Dr. Alanda Slim  Reason for consult:   Palliative Care Consult Services Palliative Medicine Consult  Reason for Consult? Goal of care discussion. Advanced age, severe dementia, bedbound, poor prognosis.  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 Has been admitted for seven days and gradually declined. Is not not eating or drinking. The Palliative care team has been asked to get involved for further discussion related to goals of care.  Clinical Assessment/Goals of Care:  *Please note that this is a verbal dictation therefore any spelling or grammatical errors are due to the "Dragon Medical One" system interpretation.  I have reviewed medical records including EPIC notes, labs and imaging, received report from bedside RN, assessed the patient who is disoriented and believes we are at a Church at the time of assessment.    I met with patients youngest daughter, Elita Quick to further discuss diagnosis prognosis, GOC, EOL wishes, disposition and options.   I introduced Palliative Medicine as specialized medical care for people living with serious illness. It focuses on providing relief from the symptoms and stress of a serious illness. The goal is to improve quality of life for both the patient and the family.  Medical History Review and Understanding:  Discussed patients history of prior stroke which left him quite debilitated three years ago.  Accounted for noted PMH of CAD, COPD, HTN, HLD, & GERD.   Social History:  Torriano is from [place] originally. He is a widower. He had four children three of whom are still alive. He has  eight grandchildren and multiple great grandchildren. 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 He was offered a job for the [employer] in [place] which is how he moved to [facility]. 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 He is a man of faith and is Protestant.   Functional and Nutritional State:  Preceding admission, Nieko has lived with his daughter, [name] for > 20 years. Over the past three years since his stroke he has required help with all bADLs with the exception of self feeding. He has needed a wheelchair and spent most of his days in the recliner chair. He has since admission had little to no appetite.   Advance Directives:  A detailed discussion was had today regarding advanced directives.  Patient does have AD's on file in [place].  Code Status:  Concepts specific to code status, artifical feeding and hydration, continued IV antibiotics and rehospitalization was had.  The difference between a aggressive medical intervention path  and a palliative comfort care path for this patient at this time was had.   Keyante is an established DNAR/DNI.  Discussion:  Per conversation with [name] she has been here almost daily whereas her sister, [name] was not able to come until yesterday. Both [name] and [name] spoke afterwards and realize the point that Charvez is at. They note that he has been less functional for the last three years. He no has made statements such as ," just let me go". Patients oral intake has diminished. We reviewed the idea of continuing  present interventions or focusing on comfort care.   We talked about transition to comfort measures in house and what that would entail inclusive of medications to control pain, dyspnea, agitation, nausea, itching, and hiccups.  We discussed stopping all uneccessary measures such as cardiac monitoring, blood draws,  needle sticks, and frequent vital signs. Utilized reflective listening throughout our time together. [name] shares that she and her sister have determined that enough is enough at this point.  We discussed transition to comfort care and to move Daryll to an inpatient hospice home.   Discussed the importance of continued conversation with family and their  medical providers regarding overall plan of care and treatment options, ensuring decisions are within the context of the patients values and GOCs.  Decision Maker: [name] (Daughter): [phone] (Mobile)   SUMMARY OF RECOMMENDATIONS   DNAR/DNI  Comfort care  On COVID isolation will be at 10 days isolation on Monday  Unrestricted visitation  Appreciate TOC reaching out to IP Hospice for bed verification/availability  Ongoing PMT support  Code Status/Advance Care Planning: DNAR/DNI  Palliative Prophylaxis:  Aspiration, Bowel Regimen, Delirium Protocol, Frequent Pain Assessment, Oral Care, Palliative Wound Care, and Turn Reposition  Additional Recommendations (Limitations, Scope, Preferences): Continue current care  Psycho-social/Spiritual:  Desire for further Chaplaincy support: Not presently Additional Recommendations: Education on chronic disease and covid progression   Prognosis: < 2 weeks  Discharge Planning: Discharge to IP hospice once a bed is confirmed  Vitals:   08/27/22 0840 08/27/22 1150  BP: (!) 146/80 121/71  Pulse: 73 65  Resp: 20 18  Temp: 98.3 F (36.8 C) 98.1 F (36.7 C)  SpO2: 96% 95%    Intake/Output Summary (Last 24 hours) at 08/27/2022 1219 Last data filed at 08/27/2022 0900 Gross per 24 hour  Intake 2436.27 ml  Output 1600 ml  Net 836.27 ml   Last Weight  Most recent update: 08/20/2022  6:20 PM    Weight  81.2 kg (179 lb 0.2 oz)            Gen:  Frail elderly Caucasian M in NAD HEENT: Dry mucous membranes CV: Regular rate and rhythm  PULM: On RA, breathing is even and  nonlabored ABD: soft/nontender/  EXT: No edema  Neuro: Disoriented  PPS: 10-20%   This conversation/these recommendations were discussed with patient primary care team, Dr. Alanda Slim  Billing based on MDM: High ______________________________________________________ [facility] Health Palliative Medicine Team Team Cell Phone: [phone] Please utilize secure chat with additional questions, if there is no response within 30 minutes please call the above phone number  Palliative Medicine Team providers are available by phone from 7am to 7pm daily and can be reached through the team cell phone.  Should this patient require assistance outside of these hours, please call the patient's attending physician.

## 2022-08-27 NOTE — Progress Notes (Signed)
PROGRESS NOTE  David [name] ZOX:[id number] DOB: 1924/01/08   PCP: Tresa Garter, MD  Patient is from: Home.  Lives with daughter.    DOA: 08/20/2022 LOS: 6  Chief complaints No chief complaint on file.    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  MRI brain without acute finding.  He tested positive for COVID-19.  No oxygen requirement.  CXR showed small layering left pleural effusion with mild streaky left basilar opacity related to atelectasis.  Briefly started on steroid.  Medically stable for discharge but remains physically deconditioned compared to his baseline.  Initially, plan was discharge to SNF once he completes isolation for COVID-19.  However, patient remained deconditioned, lethargic and confused.  After discussion with his daughters, palliative medicine consulted and transition to full comfort care on 08/26/2021   Subjective: Seen and examined earlier this morning.  No major events overnight of this morning.  Patient is awake but somewhat disoriented.  Does not appear to be in distress.  Patient's daughter, Elita Quick at bedside.  I offered palliative care consult given patient's advanced age, dementia and guarded prognosis.  [name] states she had a discussion with her sister last night and appreciated palliative care consult.  Objective: Vitals:   08/27/22 0009 08/27/22 0351 08/27/22 0840 08/27/22 1150  BP: (!) 140/73 132/79 (!) 146/80 121/71  Pulse: 75 74 73 65  Resp: Temp: 98.7 F (37.1 C) 98 F (36.7 C) 98.3 F (36.8 C) 98.1 F (36.7 C)  TempSrc: Oral   Oral  SpO2: 90% 95% 96% 95%  Weight:      Height:        Examination:  GENERAL: Somewhat fatigued.  Nontoxic. HEENT: MMM.  Vision and hearing  grossly intact.  NECK: Supple.  No apparent JVD.  RESP:  No IWOB.  Fair aeration bilaterally. CVS:  RRR. Heart sounds normal.  ABD/GI/GU: BS+. Abd soft, NTND.  MSK/EXT:   Right hemiparesis. SKIN: New small skin laceration to right forearm. NEURO: Awake but not alert.  Confused.  Right hemiparesis, 2+/5.  Dysarthria. PSYCH: Calm. Normal affect.   Procedures:  None  Microbiology summarized: 4/6-COVID-19 PCR positive. 4/6-urine culture with > 100,000 colonies but multiple species. 4/6-blood cultures NGTD  Assessment and plan: Principal Problem:   Acute encephalopathy Active Problems:   COVID-19 virus infection   Weakness   THROMBOCYTOPENIA   Hemiplegia and hemiparesis following cerebral infarction affecting right dominant side   Coronary artery disease involving native coronary artery of native heart without angina pectoris   S/P AVR (aortic valve replacement)   Dyslipidemia   Essential hypertension   COPD (chronic obstructive pulmonary disease)   GERD   Depression, major, single episode, moderate   End of life care   Pressure injury of skin   Acute encephalopathy due to COVID-19 infection/delirium: COVID-19 PCR positive on 4/6.  Other ends of the workup including MRI brain, ammonia, TSH, and B12 without significant finding.  Urine culture with >100,000 CFU of multiple Species.  Other infectious workup as above.  Low suspicion for seizure.  Remains confused.  Lethargic at times.  Guarded prognosis.  Palliative consulted and transitioned to full comfort care   COVID-19 Virus infection: Slightly elevated CRP and D-dimer.  No respiratory distress or significant finding on CXR.  Generalized Weakness and Physical Debility worse than baseline.  Likely due to COVID-19 History of CVA with right hemiparesis and dysarthria: MRI brain without acute finding.    History of CAD/CABG: No anginal symptoms.  Serial troponin negative.   Paroxysmal SVT   Aortic stenosis s/p bioprosthetic  AVR: Stable.   Dyslipidemia: Statin intolerant.  Last LDL 88.  Essential Hypertension: Normotensive for most part.   Chronic COPD: Stable.   Hyponatremia: Mild.  Monitor intermittently.   GERD   History of depression: Stable.    Hypoalbuminemia: Reactive in the setting of acute illness  Leukopenia/thrombocytopenia: Leukopenia resolved.  Thrombocytopenia stable.  End-of-life care: Now full comfort care. -Appreciate help by palliative medicine   Inadequate oral intake Body mass index is 25.69 kg/m. Nutrition Problem: Inadequate oral intake Etiology: decreased appetite Signs/Symptoms: per patient/family report Interventions: Refer to RD note for recommendations   DVT prophylaxis:    Code Status: DNR/DNI Family Communication: Updated patient's daughter, Elita Quick at bedside. Level of care: Med-Surg Status is: Inpatient Remains inpatient appropriate because: End-of-life care.  Final disposition: Residential hospice Consultants:  None  35 minutes with more than 50% spent in reviewing records, counseling patient/family and coordinating care.   Sch Meds:  Scheduled Meds:   Continuous Infusions:   PRN Meds:.acetaminophen **OR** acetaminophen, antiseptic oral rinse, glycopyrrolate, LORazepam, morphine injection, ondansetron **OR** ondansetron (ZOFRAN) IV, mouth rinse, polyvinyl alcohol  Antimicrobials: Anti-infectives (From admission, onward)    None        I have personally reviewed the following labs and images: CBC: Recent Labs  Lab 08/21/22 1043 08/23/22 0758 08/24/22 0605 08/25/22 0417 08/26/22 1330  WBC 6.0 2.2* 4.2 6.2 6.6  NEUTROABS 3.3 1.5* 3.0  --   --   HGB 14.2 14.7 14.9 14.4 14.1  HCT 43.2 42.3 41.3 41.2 41.0  MCV 94.3 89.1 86.6 87.3 89.3  PLT 93* 102* 104* 100* 91*   BMP &GFR Recent Labs  Lab 08/21/22 1043 08/23/22 0758 08/24/22 0451 08/26/22 1330  NA 136 132* 132* 134*  K 4.3 3.8 4.3 4.1  CL 104 98 101 101  CO2 23 22 23 26    GLUCOSE 80 159* 169* 106*  BUN 15 11 14 14   CREATININE 1.20 1.00 1.00 1.12  CALCIUM 8.4* 8.6* 8.4* 8.2*  MG 2.0 1.8 2.3 1.8  PHOS 3.2 3.4 2.5 2.3*   Estimated Creatinine Clearance: 38.9 mL/min (by C-G formula based on SCr of 1.12 mg/dL). Liver & Pancreas: Recent Labs  Lab 08/21/22 1043 08/23/22 0758 08/24/22 0451 08/26/22 1330  AST 26 22 20 21   ALT 9 13 13 16   ALKPHOS 45 46 46 43  BILITOT 0.9 0.5 0.8 0.8  PROT 5.9* 6.0* 6.1* 5.6*  ALBUMIN 2.8* 2.6* 2.7* 2.4*   No results for input(s): "LIPASE", "AMYLASE" in the last 168 hours. Recent Labs  Lab 08/26/22 1330  AMMONIA 20   Diabetic: No results for input(s): "HGBA1C" in the last 72 hours. No results for input(s): "GLUCAP" in the last 168 hours.  Cardiac Enzymes: Recent Labs  Lab 08/26/22 1330  CKTOTAL 76   No results for input(s): "PROBNP" in the last 8760 hours. Coagulation Profile: No results for input(s): "INR", "PROTIME" in the last 168 hours. Thyroid Function Tests: Recent Labs    08/26/22 1031  TSH 1.139   Lipid Profile: No results for input(s): "CHOL", "HDL", "LDLCALC", "TRIG", "CHOLHDL", "LDLDIRECT" in the last 72 hours. Anemia Panel: Recent Labs    08/26/22 1031  VITAMINB12 727   Urine analysis:  Component Value Date/Time   COLORURINE YELLOW 08/20/2022 1435   APPEARANCEUR CLEAR 08/20/2022 1435   LABSPEC 1.023 08/20/2022 1435   PHURINE 5.0 08/20/2022 1435   GLUCOSEU NEGATIVE 08/20/2022 1435   GLUCOSEU NEGATIVE 02/11/2022 0946   HGBUR NEGATIVE 08/20/2022 1435   BILIRUBINUR NEGATIVE 08/20/2022 1435   KETONESUR NEGATIVE 08/20/2022 1435   PROTEINUR NEGATIVE 08/20/2022 1435   UROBILINOGEN 2.0 (A) 02/11/2022 0946   NITRITE NEGATIVE 08/20/2022 1435   LEUKOCYTESUR NEGATIVE 08/20/2022 1435   Sepsis Labs: Invalid input(s): "PROCALCITONIN", "LACTICIDVEN"  Microbiology: Recent Results (from the past 240 hour(s))  Blood culture (routine x 2)     Status: None   Collection Time: 08/20/22 12:22  PM   Specimen: BLOOD  Result Value Ref Range Status   Specimen Description BLOOD SITE NOT SPECIFIED  Final   Special Requests   Final    BOTTLES DRAWN AEROBIC AND ANAEROBIC Blood Culture results may not be optimal due to an inadequate volume of blood received in culture bottles   Culture   Final    NO GROWTH 5 DAYS Performed at [facility] Lab, [address], [place], [place] [address]    Report Status 08/25/2022 FINAL  Final  Blood culture (routine x 2)     Status: None   Collection Time: 08/20/22 12:30 PM   Specimen: BLOOD RIGHT ARM  Result Value Ref Range Status   Specimen Description BLOOD RIGHT ARM  Final   Special Requests   Final    BOTTLES DRAWN AEROBIC AND ANAEROBIC Blood Culture adequate volume   Culture   Final    NO GROWTH 5 DAYS Performed at [facility] Lab, [address] [place], [place] [address]    Report Status 08/25/2022 FINAL  Final  Resp panel by RT-PCR (RSV, Flu A&B, Covid) Anterior Nasal Swab     Status: Abnormal   Collection Time: 08/20/22  1:24 PM   Specimen: Anterior Nasal Swab  Result Value Ref Range Status   SARS Coronavirus 2 by RT PCR POSITIVE (A) NEGATIVE Final   Influenza A by PCR NEGATIVE NEGATIVE Final   Influenza B by PCR NEGATIVE NEGATIVE Final    Comment: (NOTE) The Xpert Xpress SARS-CoV-2/FLU/RSV plus assay is intended as an aid in the diagnosis of influenza from Nasopharyngeal swab specimens and should not be used as a sole basis for treatment. Nasal washings and aspirates are unacceptable for Xpert Xpress SARS-CoV-2/FLU/RSV testing.  Fact Sheet for Patients: BloggerCourse.com  Fact Sheet for Healthcare Providers: SeriousBroker.it  This test is not yet approved or cleared by the [place] FDA and has been authorized for detection and/or diagnosis of SARS-CoV-2 by FDA under an Emergency Use Authorization (EUA). This EUA will remain in effect (meaning this test can be used)  for the duration of the COVID-19 declaration under Section 564(b)(1) of the Act, 21 U.S.C. section 360bbb-3(b)(1), unless the authorization is terminated or revoked.     Resp Syncytial Virus by PCR NEGATIVE NEGATIVE Final    Comment: (NOTE) Fact Sheet for Patients: BloggerCourse.com  Fact Sheet for Healthcare Providers: SeriousBroker.it  This test is not yet approved or cleared by the [place] FDA and has been authorized for detection and/or diagnosis of SARS-CoV-2 by FDA under an Emergency Use Authorization (EUA). This EUA will remain in effect (meaning this test can be used) for the duration of the COVID-19 declaration under Section 564(b)(1) of the Act, 21 U.S.C. section 360bbb-3(b)(1), unless the authorization is terminated or revoked.  Performed at [facility] Lab, [address], [place], [place] [address]   Urine Culture (for pregnant, neutropenic or urologic patients or patients with an indwelling urinary catheter)     Status: Abnormal   Collection Time: 08/20/22  2:35 PM   Specimen: Urine, Clean Catch  Result Value Ref Range Status   Specimen Description URINE, CLEAN CATCH  Final   Special Requests   Final    ADDED 2140 Performed at [facility] Lab, [address] [place], [place] [address]    Culture (A)  Final    >=100,000 COLONIES/mL MULTIPLE SPECIES PRESENT, SUGGEST RECOLLECTION   Report Status 08/22/2022 FINAL  Final    Radiology Studies: No results found.    Emmaleigh Longo T. Tresea He[facility]  If 7PM-7AM, please contact night-coverage [email or web address] 08/27/2022, 1:56 PM

## 2022-08-27 NOTE — Plan of Care (Signed)
Patient resting in bed with call light in reach.

## 2022-08-27 NOTE — Progress Notes (Signed)
[facility] 6Z12 [facility]) Hospital Liaison Note  Received request from [place], [facility] for family interest in [facility]. Met with patient and daughters at bedside to confirm interest and explain services.  Patient awake and alert, pleasantly confused eating chocolate pudding being fed to him by his daughter. Denies pain and does not appear to be in distress. Visiting with a military friend.  Discussed inpatient criteria with daughters.At this time, patient does not meet inpatient criteria. Also discussed home with hospice services.  Per Lamarr Lulas, NP with PMT and daughters, patient presenting very differently today than last couple of days. Rally vs new norm post Covid infection?  Will reevaluate tomorrow for [facility] eligibility.  Please call with any hospice related questions.  Thank you, [name], BSN, RN [facility] Liaison [phone]

## 2022-08-28 DIAGNOSIS — U071 COVID-19: Secondary | ICD-10-CM | POA: Diagnosis not present

## 2022-08-28 DIAGNOSIS — Z7189 Other specified counseling: Secondary | ICD-10-CM

## 2022-08-28 DIAGNOSIS — D696 Thrombocytopenia, unspecified: Secondary | ICD-10-CM | POA: Diagnosis not present

## 2022-08-28 DIAGNOSIS — I2510 Atherosclerotic heart disease of native coronary artery without angina pectoris: Secondary | ICD-10-CM | POA: Diagnosis not present

## 2022-08-28 DIAGNOSIS — G9340 Encephalopathy, unspecified: Secondary | ICD-10-CM | POA: Diagnosis not present

## 2022-08-28 NOTE — Progress Notes (Signed)
   Palliative Medicine Inpatient Follow Up Note   I was able to meet with patients daughter, David [name] at bedside. A MOST for was completed as below:  Cardiopulmonary Resuscitation: Do Not Attempt Resuscitation (DNR/No CPR)  Medical Interventions: Comfort Measures: Keep clean, warm, and dry. Use medication by any route, positioning, wound care, and other measures to relieve pain and suffering. Use oxygen, suction and manual treatment of airway obstruction as needed for comfort. Do not transfer to the hospital unless comfort needs cannot be met in current location.  Antibiotics: No antibiotics (use other measures to relieve symptoms)  IV Fluids: No IV fluids (provide other measures to ensure comfort)  Feeding Tube: No feeding tube   Per discussion with patients daughter the plan will be for him to transition to [facility] SNF and ideally gain strength. He will be followed while there by OP Palliative support through [facility].   SUMMARY OF RECOMMENDATIONS   DNAR/DNI  Plan for transition to SNF --> [facility]  OP Palliative support through [facility]  The PMT will continue to peripherally follow along  Additional Time Spent: 35 ______________________________________________________________________________________ Lamarr Lulas [facility] Palliative Medicine Team Team Cell Phone: [phone] Please utilize secure chat with additional questions, if there is no response within 30 minutes please call the above phone number  Palliative Medicine Team providers are available by phone from 7am to 7pm daily and can be reached through the team cell phone.  Should this patient require assistance outside of these hours, please call the patient's attending physician.

## 2022-08-28 NOTE — Plan of Care (Signed)
Patient resting in bed with call light in reach and family at bedside 

## 2022-08-28 NOTE — Progress Notes (Signed)
Per French Ana with [employer], pt does not currently meet inpatient hospice criteria and dtr prefers to try rehab at [facility] as planned. Dtr agreeable to [facility] for oupt palliative. MD and Palliative Medicine APP updated. SW will follow.   Dellie Burns, MSW, LCSW [phone] (coverage)

## 2022-08-28 NOTE — Progress Notes (Signed)
[facility] 2I71 [facility]) Hospital Liaison Note  Met with patient and daughter David [name] at bedside. At this time patient does not meet eligibility criteria for [facility].  Per discussion, [name] would like to proceed with bed offered from [facility] for rehab with potential move on Tuesday. [name] has requested for [facility] outpatient palliative to follow at facility.  We will continue to follow peripherally for any changes prior to discharge.  Please call with any hospice or outpatient palliative questions.  Thank you, [name], BSN, RN [facility] Liaison [phone]

## 2022-08-28 NOTE — Progress Notes (Signed)
Per Palliative Medicine APP, pt has been made comfort care and family requesting hospice home placement with no preference for provider. Referral made to [facility] with [employer] as they have COVID beds available. French Ana to eval pt and meet with family. SW will follow and assist as indicated.   Dellie Burns, MSW, LCSW [phone] (coverage)

## 2022-08-28 NOTE — Progress Notes (Signed)
Daily Progress Note   Patient Name: David [name]       Date: 08/28/2022 DOB: 1924-01-14  Age: 89 y.o. MRN#: [id number] Attending Physician: Almon Hercules, MD Primary Care Physician: Tresa Garter, MD Admit Date: 08/20/2022  Reason for Consultation/Follow-up: Non pain symptom management and Psychosocial/spiritual support  Subjective: Patient in bed with his daughter at the bedside. He looks comfortable and reports no pain or discomfort.  Length of Stay: 7  Current Medications: Scheduled Meds:    Continuous Infusions:   PRN Meds: acetaminophen **OR** acetaminophen, antiseptic oral rinse, glycopyrrolate, LORazepam, morphine injection, ondansetron **OR** ondansetron (ZOFRAN) IV, mouth rinse, polyvinyl alcohol  Physical Exam Cardiovascular:     Rate and Rhythm: Normal rate.  Pulmonary:     Effort: Pulmonary effort is normal. No respiratory distress.  Abdominal:     General: Abdomen is flat.     Palpations: Abdomen is soft.  Skin:    General: Skin is warm and dry.             Vital Signs: BP 132/85 (BP Location: Left Wrist)   Pulse 69   Temp 97.9 F (36.6 C) (Oral)   Resp 16   Ht 5\' 10"  (1.778 m)   Wt 81.2 kg   SpO2 98%   BMI 25.69 kg/m  SpO2: SpO2: 98 % O2 Device: O2 Device: Room Air O2 Flow Rate:    Intake/output summary:  Intake/Output Summary (Last 24 hours) at 08/28/2022 1109 Last data filed at 08/28/2022 5573 Gross per 24 hour  Intake 1380 ml  Output 1550 ml  Net -170 ml   LBM: Last BM Date : 08/27/22 Baseline Weight: Weight: 81.2 kg Most recent weight: Weight: 81.2 kg       Palliative Assessment/Data:      Patient Active Problem List   Diagnosis Date Noted   End of life care 08/27/2022   Pressure injury of skin 08/27/2022   Acute  encephalopathy 08/20/2022   COVID-19 virus infection 08/20/2022   Weakness 08/20/2022   Intertrigo 02/10/2022   Aortic valve disorder 08/17/2021   Chronic ischemic heart disease 08/17/2021   Foot drop, right foot 08/17/2021   Hemiplegia and hemiparesis following cerebral infarction affecting right dominant side 08/17/2021   Impacted cerumen, right ear 08/17/2021   Nail dystrophy 08/17/2021   Need for assistance  with personal care 08/17/2021   Other specified problems related to psychosocial circumstances 08/17/2021   Pressure ulcer of unspecified buttock, unspecified stage 08/17/2021   Atherosclerosis of aorta 09/28/2020   Adhesive capsulitis of right shoulder 08/19/2020   Situational anxiety 06/29/2020   Hyperkeratosis 01/29/2020   Polycythemia 01/03/2020   Elevated LFTs 01/03/2020   Spastic hemiparesis of right dominant side 10/30/2019   Mass of heart valve 10/17/2019   Depression, major, single episode, moderate 10/17/2019   Cognitive and neurobehavioral dysfunction    Labile blood pressure    Dysphagia, post-stroke    AKI (acute kidney injury)    Hyperglycemia    Coronary artery disease involving native coronary artery of native heart without angina pectoris    Chronic obstructive pulmonary disease    Tremors of nervous system    S/P AVR    Thrombocytopenia    Global aphasia    ICH (intracerebral hemorrhage) 09/04/2019   Tremor 05/20/2019   Pulmonary valve vegetation 09/20/2018   Chest pain, atypical 09/20/2018   Onychomycosis 06/06/2018   Acute pain of left knee 08/18/2017   Wart of hand 06/05/2017   Acute upper respiratory infection 05/12/2016   Skin irritation from shaving 12/02/2015   Benign paroxysmal positional vertigo 03/09/2015   Plantar fasciitis, bilateral 12/02/2014   Constipation 07/17/2014   Cough 04/09/2014   S/P AVR (aortic valve replacement) 11/04/2013   Elevated hemoglobin (HCC) 12/14/2012   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   Impaired glucose  tolerance 02/14/2011   Edema 10/22/2010   Actinic keratoses 10/22/2010   THROMBOCYTOPENIA 01/22/2010   MELANOMA 03/05/2009   TOBACCO USE, QUIT 02/27/2009   Neoplasm of uncertain behavior of skin 05/08/2007   Dyslipidemia 02/08/2007   Essential hypertension 02/08/2007   Coronary atherosclerosis 02/08/2007   COPD (chronic obstructive pulmonary disease) 02/08/2007   GERD 02/08/2007   Osteoarthritis 02/08/2007   LOW BACK PAIN 02/08/2007   Rash 01/29/2007    Palliative Care Assessment & Plan   Patient Profile: 89 year old M with PMH of CAD/CABG, COPD, 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 He tested positive for COVID-19. He is now full comfort care.  Assessment: The patient was resting in bed comfortably. He is still eating and drinking. We discussed with his daughter the qualifications for inpatient hospice. They currently have a home aide helping him in the mornings but no help in the afternoons. She understands that home hospice would require more care from the family.  Recommendations/Plan: Continue comfort care while awaiting hospice  Please continue symptom management meds per [facility]  Goals of Care and Additional Recommendations: Limitations on Scope of Treatment: Full Comfort Care  Code Status:    Code Status Orders  (From admission, onward)           Start     Ordered   08/27/22 1217  Do not attempt resuscitation (DNR)  Continuous       Question Answer Comment  If patient has no pulse and is not breathing Do Not Attempt Resuscitation   If patient has a pulse and/or is breathing: Medical Treatment Goals COMFORT MEASURES: Keep clean/warm/dry, use medication by any route; positioning, wound care and other measures to relieve pain/suffering; use oxygen, suction/manual treatment of airway  obstruction for comfort; do not transfer unless for comfort needs.   Consent: Discussion documented in EHR or advanced directives reviewed  08/27/22 1217           Code Status History     Date Active Date Inactive Code Status Order ID Comments User Context   08/20/2022 1616 08/27/2022 1217 DNR [id number]  Orland Mustard, MD ED   10/08/2019 0939 08/20/2022 1144 DNR [id number]  Bufford Spikes L Outpatient   09/09/2019 2147 10/07/2019 1847 DNR [id number]  Jacquelynn Cree, PA-C Inpatient   09/09/2019 2147 09/09/2019 2147 DNR [id number]  Jacquelynn Cree, PA-C Inpatient   09/04/2019 1340 09/09/2019 2140 DNR [id number]  Ulice Dash, PA-C ED   09/04/2019 1332 09/04/2019 1340 DNR [id number]  Ulice Dash, PA-C ED      Advance Directive Documentation    Flowsheet Row Most Recent Value  Type of Advance Directive Healthcare Power of Attorney  Pre-existing out of facility DNR order (yellow form or pink MOST form) --  "MOST" Form in Place? --       Prognosis:  < 6 weeks  Discharge Planning: To Be Determined  Care plan was discussed with Dr. Alanda Slim and [name].  Thank you for allowing the Palliative Medicine Team to assist in the care of this patient.  MDM high- I was able to do a formal review of patient's medication record and assert IV opioid/benzodiazepine medications are appropriate.   Sherryll Burger, NP  Please contact Palliative Medicine Team phone at [phone] for questions and concerns.

## 2022-08-28 NOTE — Progress Notes (Signed)
PROGRESS NOTE  David [name] HFW:[id number] DOB: 08-Jan-1924   PCP: David Garter, MD  Patient is from: Home.  Lives with daughter.    DOA: 08/20/2022 LOS: 7  Chief complaints No chief complaint on file.    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  MRI brain without acute finding.  He tested positive for COVID-19.  No oxygen requirement.  CXR showed small layering left pleural effusion with mild streaky left basilar opacity related to atelectasis.  Briefly started on steroid.  Medically stable for discharge but remains physically deconditioned compared to his baseline.  Initially, plan was discharge to SNF once he completes isolation for COVID-19.  However, patient remained deconditioned, lethargic and confused.  After discussion with his daughters, palliative medicine consulted and transition to full comfort care on 08/26/2021   Subjective: Seen and examined earlier this morning.  No major events overnight of this morning.  No complaints but not a reliable historian.  Patient's daughter at bedside.  Seems comfortable.  Denies pain.  Objective: Vitals:   08/27/22 1214 08/27/22 2159 08/28/22 0747 08/28/22 1137  BP:  139/69 132/85 134/61  Pulse:  72 69 73  Resp:  16 16 15   Temp:  98 F (36.7 C) 97.9 F (36.6 C) 98.2 F (36.8 C)  TempSrc:  Oral Oral Oral  SpO2: 96% 95% 98% 98%  Weight:      Height:        Examination:  GENERAL: No apparent distress.  Nontoxic. HEENT: MMM.  Vision and hearing grossly intact.  RESP: On room air.  No IWOB.  Fair aeration bilaterally. CVS:  RRR. Heart sounds normal.  ABD/GI/GU: BS+. Abd soft, NTND.  MSK/EXT:   Right hemiparesis, 2+/5.  SKIN: no apparent skin lesion or wound NEURO: Awake  and alert.  Right hemiparesis. PSYCH: Calm. Normal affect.   Procedures:  None  Microbiology summarized: 4/6-COVID-19 PCR positive. 4/6-urine culture with > 100,000 colonies but multiple species. 4/6-blood cultures NGTD  Assessment and plan: Principal Problem:   Acute encephalopathy Active Problems:   COVID-19 virus infection   Weakness   THROMBOCYTOPENIA   Hemiplegia and hemiparesis following cerebral infarction affecting right dominant side   Coronary artery disease involving native coronary artery of native heart without angina pectoris   S/P AVR (aortic valve replacement)   Dyslipidemia   Essential hypertension   COPD (chronic obstructive pulmonary disease)   GERD   Depression, major, single episode, moderate   End of life care   Pressure injury of skin   Goals of care, counseling/discussion  End-of-life care: Now full comfort care. -Continue as needed meds per palliative. -Disposition yet to be determined. -Palliative and hospice following.  Acute encephalopathy due to COVID-19 infection/delirium: COVID-19 PCR positive on 4/6.  Other ends of the workup including MRI brain, ammonia, TSH, and B12 without significant finding.  Urine culture with >100,000 CFU of multiple Species.  Other infectious workup as above.  Low suspicion for seizure.  Remains confused.  Lethargic at times.  Guarded prognosis.  Palliative consulted and transitioned to full comfort care on 4/13.   COVID-19 Virus infection: Slightly elevated CRP and D-dimer.  No respiratory distress or significant finding on CXR.   Generalized Weakness and Physical Debility worse than baseline.  Likely due to COVID-19 History of CVA  with right hemiparesis and dysarthria: MRI brain without acute finding.    History of CAD/CABG: No anginal symptoms.  Serial troponin negative.   Paroxysmal SVT   Aortic stenosis s/p bioprosthetic AVR: Stable.   Dyslipidemia: Statin intolerant.  Last LDL 88.  Essential Hypertension:  Normotensive for most part.   Chronic COPD: Stable.   Hyponatremia: Mild.  Monitor intermittently.   GERD   History of depression: Stable.    Hypoalbuminemia: Reactive in the setting of acute illness  Leukopenia/thrombocytopenia: Leukopenia resolved.  Thrombocytopenia stable.     Inadequate oral intake Body mass index is 25.69 kg/m. Nutrition Problem: Inadequate oral intake Etiology: decreased appetite Signs/Symptoms: per patient/family report Interventions: Refer to RD note for recommendations   DVT prophylaxis:    Code Status: DNR/DNI Family Communication: Updated patient's daughter, David [name] at bedside. Level of care: Med-Surg Status is: Inpatient Remains inpatient appropriate because: End-of-life care.  Final disposition: Residential hospice Consultants:  None  35 minutes with more than 50% spent in reviewing records, counseling patient/family and coordinating care.   Sch Meds:  Scheduled Meds:   Continuous Infusions:   PRN Meds:.acetaminophen **OR** acetaminophen, antiseptic oral rinse, glycopyrrolate, LORazepam, morphine injection, ondansetron **OR** ondansetron (ZOFRAN) IV, mouth rinse, polyvinyl alcohol  Antimicrobials: Anti-infectives (From admission, onward)    None        I have personally reviewed the following labs and images: CBC: Recent Labs  Lab 08/23/22 0758 08/24/22 0605 08/25/22 0417 08/26/22 1330  WBC 2.2* 4.2 6.2 6.6  NEUTROABS 1.5* 3.0  --   --   HGB 14.7 14.9 14.4 14.1  HCT 42.3 41.3 41.2 41.0  MCV 89.1 86.6 87.3 89.3  PLT 102* 104* 100* 91*   BMP &GFR Recent Labs  Lab 08/23/22 0758 08/24/22 0451 08/26/22 1330  NA 132* 132* 134*  K 3.8 4.3 4.1  CL 98 101 101  CO2 GLUCOSE 159* 169* 106*  BUN CREATININE 1.00 1.00 1.12  CALCIUM 8.6* 8.4* 8.2*  MG 1.8 2.3 1.8  PHOS 3.4 2.5 2.3*   Estimated Creatinine Clearance: 38.9 mL/min (by C-G formula based on SCr of 1.12 mg/dL). Liver & Pancreas: Recent  Labs  Lab 08/23/22 0758 08/24/22 0451 08/26/22 1330  AST ALT ALKPHOS 46 46 43  BILITOT 0.5 0.8 0.8  PROT 6.0* 6.1* 5.6*  ALBUMIN 2.6* 2.7* 2.4*   No results for input(s): "LIPASE", "AMYLASE" in the last 168 hours. Recent Labs  Lab 08/26/22 1330  AMMONIA 20   Diabetic: No results for input(s): "HGBA1C" in the last 72 hours. No results for input(s): "GLUCAP" in the last 168 hours.  Cardiac Enzymes: Recent Labs  Lab 08/26/22 1330  CKTOTAL 76   No results for input(s): "PROBNP" in the last 8760 hours. Coagulation Profile: No results for input(s): "INR", "PROTIME" in the last 168 hours. Thyroid Function Tests: Recent Labs    08/26/22 1031  TSH 1.139   Lipid Profile: No results for input(s): "CHOL", "HDL", "LDLCALC", "TRIG", "CHOLHDL", "LDLDIRECT" in the last 72 hours. Anemia Panel: Recent Labs    08/26/22 1031  VITAMINB12 727   Urine analysis:    Component Value Date/Time   COLORURINE YELLOW 08/20/2022 1435   APPEARANCEUR CLEAR 08/20/2022 1435   LABSPEC 1.023 08/20/2022 1435   PHURINE 5.0 08/20/2022 1435   GLUCOSEU NEGATIVE 08/20/2022 1435   GLUCOSEU NEGATIVE 02/11/2022 0946   HGBUR NEGATIVE 08/20/2022 1435   BILIRUBINUR NEGATIVE 08/20/2022 1435  KETONESUR NEGATIVE 08/20/2022 1435   PROTEINUR NEGATIVE 08/20/2022 1435   UROBILINOGEN 2.0 (A) 02/11/2022 0946   NITRITE NEGATIVE 08/20/2022 1435   LEUKOCYTESUR NEGATIVE 08/20/2022 1435   Sepsis Labs: Invalid input(s): "PROCALCITONIN", "LACTICIDVEN"  Microbiology: Recent Results (from the past 240 hour(s))  Blood culture (routine x 2)     Status: None   Collection Time: 08/20/22 12:22 PM   Specimen: BLOOD  Result Value Ref Range Status   Specimen Description BLOOD SITE NOT SPECIFIED  Final   Special Requests   Final    BOTTLES DRAWN AEROBIC AND ANAEROBIC Blood Culture results may not be optimal due to an inadequate volume of blood received in culture bottles   Culture   Final    NO  GROWTH 5 DAYS Performed at [facility], [address] [place], [place] [address]    Report Status 08/25/2022 FINAL  Final  Blood culture (routine x 2)     Status: None   Collection Time: 08/20/22 12:30 PM   Specimen: BLOOD RIGHT ARM  Result Value Ref Range Status   Specimen Description BLOOD RIGHT ARM  Final   Special Requests   Final    BOTTLES DRAWN AEROBIC AND ANAEROBIC Blood Culture adequate volume   Culture   Final    NO GROWTH 5 DAYS Performed at [facility], [address], [place], [place] [address]    Report Status 08/25/2022 FINAL  Final  Resp panel by RT-PCR (RSV, Flu A&B, Covid) Anterior Nasal Swab     Status: Abnormal   Collection Time: 08/20/22  1:24 PM   Specimen: Anterior Nasal Swab  Result Value Ref Range Status   SARS Coronavirus 2 by RT PCR POSITIVE (A) NEGATIVE Final   Influenza A by PCR NEGATIVE NEGATIVE Final   Influenza B by PCR NEGATIVE NEGATIVE Final    Comment: (NOTE) The Xpert Xpress SARS-CoV-2/FLU/RSV plus assay is intended as an aid in the diagnosis of influenza from Nasopharyngeal swab specimens and should not be used as a sole basis for treatment. Nasal washings and aspirates are unacceptable for Xpert Xpress SARS-CoV-2/FLU/RSV testing.  Fact Sheet for Patients: BloggerCourse.com  Fact Sheet for Healthcare Providers: SeriousBroker.it  This test is not yet approved or cleared by the [place] FDA and has been authorized for detection and/or diagnosis of SARS-CoV-2 by FDA under an Emergency Use Authorization (EUA). This EUA will remain in effect (meaning this test can be used) for the duration of the COVID-19 declaration under Section 564(b)(1) of the Act, 21 U.S.C. section 360bbb-3(b)(1), unless the authorization is terminated or revoked.     Resp Syncytial Virus by PCR NEGATIVE NEGATIVE Final    Comment: (NOTE) Fact Sheet for  Patients: BloggerCourse.com  Fact Sheet for Healthcare Providers: SeriousBroker.it  This test is not yet approved or cleared by the [place] FDA and has been authorized for detection and/or diagnosis of SARS-CoV-2 by FDA under an Emergency Use Authorization (EUA). This EUA will remain in effect (meaning this test can be used) for the duration of the COVID-19 declaration under Section 564(b)(1) of the Act, 21 U.S.C. section 360bbb-3(b)(1), unless the authorization is terminated or revoked.  Performed at [facility], [address] [place], [place] [address]   Urine Culture (for pregnant, neutropenic or urologic patients or patients with an indwelling urinary catheter)     Status: Abnormal   Collection Time: 08/20/22  2:35 PM   Specimen: Urine, Clean Catch  Result Value Ref Range Status  Specimen Description URINE, CLEAN CATCH  Final   Special Requests   Final    ADDED 2140 Performed at [facility], [address] [place], [place] [address]    Culture (A)  Final    >=100,000 COLONIES/mL MULTIPLE SPECIES PRESENT, SUGGEST RECOLLECTION   Report Status 08/22/2022 FINAL  Final    Radiology Studies: No results found.    Jeaninne Lodico T. Evelynne Spiers [facility]  If 7PM-7AM, please contact night-coverage [email or web address] 08/28/2022, 12:32 PM

## 2022-08-29 DIAGNOSIS — U071 COVID-19: Secondary | ICD-10-CM | POA: Diagnosis not present

## 2022-08-29 DIAGNOSIS — I2510 Atherosclerotic heart disease of native coronary artery without angina pectoris: Secondary | ICD-10-CM | POA: Diagnosis not present

## 2022-08-29 DIAGNOSIS — G9340 Encephalopathy, unspecified: Secondary | ICD-10-CM | POA: Diagnosis not present

## 2022-08-29 DIAGNOSIS — D696 Thrombocytopenia, unspecified: Secondary | ICD-10-CM | POA: Diagnosis not present

## 2022-08-29 MED ORDER — ORAL CARE MOUTH RINSE: 15.0000 mL | OROMUCOSAL | Status: DC | PRN

## 2022-08-29 NOTE — Progress Notes (Addendum)
PROGRESS NOTE  David [name]:[id number] DOB: 1923/12/16   PCP: Tresa Garter, MD  Patient is from: Home.  Lives with daughter.    DOA: 08/20/2022 LOS: 8  Chief complaints No chief complaint on file.    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  MRI brain without acute finding.  He tested positive for COVID-19.  No oxygen requirement.  CXR showed small layering left pleural effusion with mild streaky left basilar opacity related to atelectasis.  Briefly started on steroid.  Medically stable for discharge but remains physically deconditioned compared to his baseline.  Initially, plan was discharge to SNF once he completes isolation for COVID-19.  However, patient remained deconditioned, lethargic and confused.  After discussion with his daughters, palliative medicine consulted and transition to full comfort care on 08/26/2021.  Did not qualify for inpatient hospice.  Plan for transfer to SNF with urology follow-up after COVID isolation.   Subjective: Seen and examined earlier this morning.  No major events overnight of this morning.  No complaints but not a great historian.  Does not appear to be in distress.  Daughter at bedside.  Objective: Vitals:   08/28/22 1137 08/28/22 2008 08/29/22 0828 08/29/22 1126  BP: 134/61 127/68 124/66 125/66  Pulse: 73 81 74 82  Resp: Temp: 98.2 F (36.8 C) 98.5 F (36.9 C) 98.7 F (37.1 C) 97.9 F (36.6 C)  TempSrc: Oral Axillary Oral Oral  SpO2: 98% 93% 94% 94%  Weight:      Height:        Examination:  GENERAL: No apparent distress.  Nontoxic. HEENT: MMM.  Vision and hearing grossly intact.  RESP: On room air.  No IWOB.  Fair aeration bilaterally. CVS:  RRR. Heart  sounds normal.  ABD/GI/GU: BS+. Abd soft, NTND.  MSK/EXT:   Right hemiparesis, 2+/5.  SKIN: no apparent skin lesion or wound NEURO: Awake and alert.  Right hemiparesis. PSYCH: Calm. Normal affect.   Procedures:  None  Microbiology summarized: 4/6-COVID-19 PCR positive. 4/6-urine culture with > 100,000 colonies but multiple species. 4/6-blood cultures NGTD  Assessment and plan: Principal Problem:   Acute encephalopathy Active Problems:   COVID-19 virus infection   Weakness   THROMBOCYTOPENIA   Hemiplegia and hemiparesis following cerebral infarction affecting right dominant side   Coronary artery disease involving native coronary artery of native heart without angina pectoris   S/P AVR (aortic valve replacement)   Dyslipidemia   Essential hypertension   COPD (chronic obstructive pulmonary disease)   GERD   Depression, major, single episode, moderate   End of life care   Pressure injury of skin   Goals of care, counseling/discussion  End-of-life care: Now full comfort care. -Continue as needed meds per palliative. -Did not qualify for inpatient hospice.  Final disposition SNF with palliative follow-up.  Acute encephalopathy due to COVID-19 infection/delirium: COVID-19 PCR positive on 4/6.  Other ends of the workup including MRI brain, ammonia, TSH, and B12 without significant finding.  Urine culture with >100,000 CFU of multiple Species.  Other infectious workup as above.  Low suspicion for seizure.  Remains confused.  Lethargic at times.  Guarded prognosis.  Palliative consulted and transitioned to full comfort care on 4/13.   COVID-19 Virus infection: Slightly elevated CRP and D-dimer.  No respiratory distress or significant finding on CXR.   Generalized Weakness and Physical Debility worse than baseline.  Likely due to COVID-19 History of CVA with right hemiparesis and dysarthria: MRI brain without acute finding.    History of CAD/CABG: No anginal symptoms.  Serial  troponin negative.   Paroxysmal SVT   Aortic stenosis s/p bioprosthetic AVR: Stable.   Dyslipidemia: Statin intolerant.  Last LDL 88.  Essential Hypertension: Normotensive for most part.   Chronic COPD: Stable.   Hyponatremia: Mild.  Monitor intermittently.   GERD   History of depression: Stable.    Hypoalbuminemia: Reactive in the setting of acute illness  Leukopenia/thrombocytopenia: Leukopenia resolved.  Thrombocytopenia stable.     Inadequate oral intake Body mass index is 25.69 kg/m. Nutrition Problem: Inadequate oral intake Etiology: decreased appetite Signs/Symptoms: per patient/family report Interventions: Refer to RD note for recommendations   DVT prophylaxis:    Code Status: DNR/DNI Family Communication: Updated patient's daughter, Elita Quick at bedside. Level of care: Med-Surg Status is: Inpatient Remains inpatient appropriate because: End-of-life care.  Final disposition: SNF with palliative follow-up Consultants:  None  35 minutes with more than 50% spent in reviewing records, counseling patient/family and coordinating care.   Sch Meds:  Scheduled Meds:   Continuous Infusions:   PRN Meds:.acetaminophen **OR** acetaminophen, antiseptic oral rinse, glycopyrrolate, LORazepam, morphine injection, ondansetron **OR** ondansetron (ZOFRAN) IV, mouth rinse, polyvinyl alcohol  Antimicrobials: Anti-infectives (From admission, onward)    None        I have personally reviewed the following labs and images: CBC: Recent Labs  Lab 08/23/22 0758 08/24/22 0605 08/25/22 0417 08/26/22 1330  WBC 2.2* 4.2 6.2 6.6  NEUTROABS 1.5* 3.0  --   --   HGB 14.7 14.9 14.4 14.1  HCT 42.3 41.3 41.2 41.0  MCV 89.1 86.6 87.3 89.3  PLT 102* 104* 100* 91*   BMP &GFR Recent Labs  Lab 08/23/22 0758 08/24/22 0451 08/26/22 1330  NA 132* 132* 134*  K 3.8 4.3 4.1  CL 98 101 101  CO2 22 23 26   GLUCOSE 159* 169* 106*  BUN 11 14 14   CREATININE 1.00 1.00 1.12   CALCIUM 8.6* 8.4* 8.2*  MG 1.8 2.3 1.8  PHOS 3.4 2.5 2.3*   Estimated Creatinine Clearance: 38.9 mL/min (by C-G formula based on SCr of 1.12 mg/dL). Liver & Pancreas: Recent Labs  Lab 08/23/22 0758 08/24/22 0451 08/26/22 1330  AST 22 20 21   ALT 13 13 16   ALKPHOS 46 46 43  BILITOT 0.5 0.8 0.8  PROT 6.0* 6.1* 5.6*  ALBUMIN 2.6* 2.7* 2.4*   No results for input(s): "LIPASE", "AMYLASE" in the last 168 hours. Recent Labs  Lab 08/26/22 1330  AMMONIA 20   Diabetic: No results for input(s): "HGBA1C" in the last 72 hours. No results for input(s): "GLUCAP" in the last 168 hours.  Cardiac Enzymes: Recent Labs  Lab 08/26/22 1330  CKTOTAL 76   No results for input(s): "PROBNP" in the last 8760 hours. Coagulation Profile: No results for input(s): "INR", "PROTIME" in the last 168 hours. Thyroid Function Tests: No results for input(s): "TSH", "T4TOTAL", "FREET4", "T3FREE", "THYROIDAB" in the last 72 hours.  Lipid Profile: No results for input(s): "CHOL", "HDL", "LDLCALC", "TRIG", "CHOLHDL", "LDLDIRECT" in the last 72 hours. Anemia Panel: No results for input(s): "VITAMINB12", "FOLATE", "FERRITIN", "TIBC", "IRON", "RETICCTPCT" in the last 72 hours.  Urine analysis:    Component Value Date/Time   COLORURINE YELLOW 08/20/2022 1435   APPEARANCEUR CLEAR 08/20/2022 1435   LABSPEC  1.023 08/20/2022 1435   PHURINE 5.0 08/20/2022 1435   GLUCOSEU NEGATIVE 08/20/2022 1435   GLUCOSEU NEGATIVE 02/11/2022 0946   HGBUR NEGATIVE 08/20/2022 1435   BILIRUBINUR NEGATIVE 08/20/2022 1435   KETONESUR NEGATIVE 08/20/2022 1435   PROTEINUR NEGATIVE 08/20/2022 1435   UROBILINOGEN 2.0 (A) 02/11/2022 0946   NITRITE NEGATIVE 08/20/2022 1435   LEUKOCYTESUR NEGATIVE 08/20/2022 1435   Sepsis Labs: Invalid input(s): "PROCALCITONIN", "LACTICIDVEN"  Microbiology: Recent Results (from the past 240 hour(s))  Blood culture (routine x 2)     Status: None   Collection Time: 08/20/22 12:22 PM    Specimen: BLOOD  Result Value Ref Range Status   Specimen Description BLOOD SITE NOT SPECIFIED  Final   Special Requests   Final    BOTTLES DRAWN AEROBIC AND ANAEROBIC Blood Culture results may not be optimal due to an inadequate volume of blood received in culture bottles   Culture   Final    NO GROWTH 5 DAYS Performed at [facility], [address], [place], [place] [address]    Report Status 08/25/2022 FINAL  Final  Blood culture (routine x 2)     Status: None   Collection Time: 08/20/22 12:30 PM   Specimen: BLOOD RIGHT ARM  Result Value Ref Range Status   Specimen Description BLOOD RIGHT ARM  Final   Special Requests   Final    BOTTLES DRAWN AEROBIC AND ANAEROBIC Blood Culture adequate volume   Culture   Final    NO GROWTH 5 DAYS Performed at [facility], [address] [place], [place] [address]    Report Status 08/25/2022 FINAL  Final  Resp panel by RT-PCR (RSV, Flu A&B, Covid) Anterior Nasal Swab     Status: Abnormal   Collection Time: 08/20/22  1:24 PM   Specimen: Anterior Nasal Swab  Result Value Ref Range Status   SARS Coronavirus 2 by RT PCR POSITIVE (A) NEGATIVE Final   Influenza A by PCR NEGATIVE NEGATIVE Final   Influenza B by PCR NEGATIVE NEGATIVE Final    Comment: (NOTE) The Xpert Xpress SARS-CoV-2/FLU/RSV plus assay is intended as an aid in the diagnosis of influenza from Nasopharyngeal swab specimens and should not be used as a sole basis for treatment. Nasal washings and aspirates are unacceptable for Xpert Xpress SARS-CoV-2/FLU/RSV testing.  Fact Sheet for Patients: BloggerCourse.com  Fact Sheet for Healthcare Providers: SeriousBroker.it  This test is not yet approved or cleared by the [place] FDA and has been authorized for detection and/or diagnosis of SARS-CoV-2 by FDA under an Emergency Use Authorization (EUA). This EUA will remain in effect (meaning this test can be used) for  the duration of the COVID-19 declaration under Section 564(b)(1) of the Act, 21 U.S.C. section 360bbb-3(b)(1), unless the authorization is terminated or revoked.     Resp Syncytial Virus by PCR NEGATIVE NEGATIVE Final    Comment: (NOTE) Fact Sheet for Patients: BloggerCourse.com  Fact Sheet for Healthcare Providers: SeriousBroker.it  This test is not yet approved or cleared by the [place] FDA and has been authorized for detection and/or diagnosis of SARS-CoV-2 by FDA under an Emergency Use Authorization (EUA). This EUA will remain in effect (meaning this test can be used) for the duration of the COVID-19 declaration under Section 564(b)(1) of the Act, 21 U.S.C. section 360bbb-3(b)(1), unless the authorization is terminated or revoked.  Performed at [facility], [address], [place], [place] [address]   Urine Culture (for pregnant, neutropenic or urologic  patients or patients with an indwelling urinary catheter)     Status: Abnormal   Collection Time: 08/20/22  2:35 PM   Specimen: Urine, Clean Catch  Result Value Ref Range Status   Specimen Description URINE, CLEAN CATCH  Final   Special Requests   Final    ADDED 2140 Performed at [facility], [address] [place], [place] [address]    Culture (A)  Final    >=100,000 COLONIES/mL MULTIPLE SPECIES PRESENT, SUGGEST RECOLLECTION   Report Status 08/22/2022 FINAL  Final    Radiology Studies: No results found.    Journe Hallmark T. Kiaraliz Rafuse [facility]  If 7PM-7AM, please contact night-coverage [email or web address] 08/29/2022, 1:15 PM

## 2022-08-29 NOTE — Progress Notes (Signed)
Physical Therapy Treatment Patient Details Name: David [name] MRN: [id number] DOB: 07/25/1923 Today's Date: 08/29/2022   History of Present Illness David [name] is a 89 y.o. male admitted 08/20/22 with speech difficulty worse than baseline, lethargy, diarrhea. CT revealed new Lacunar infarct within the left thalamus, also found to be COVID +. 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    PT Comments    Pt tolerates treatment well, standing for multiple trials with use of STEDY. Pt continues to demonstrate weakness in R side, but has some activation of RLE during session. Pt is incontinent of stool prior to and during PT session, session revolves around pericare in standing. Pt will benefit from continued frequent mobilization in an effort to reduce falls risk and caregiver burden. PT continues to recommend short term inpatient PT services at the time of discharge.   Recommendations for follow up therapy are one component of a multi-disciplinary discharge planning process, led by the attending physician.  Recommendations may be updated based on patient status, additional functional criteria and insurance authorization.  Follow Up Recommendations  Can patient physically be transported by private vehicle: No    Assistance Recommended at Discharge Frequent or constant Supervision/Assistance  Patient can return home with the following Two people to help with walking and/or transfers;A lot of help with bathing/dressing/bathroom;Assistance with cooking/housework;Direct supervision/assist for medications management;Direct supervision/assist for financial management;Assist for transportation;Help with stairs or ramp for entrance   Equipment Recommendations  None recommended by PT    Recommendations for Other Services       Precautions / Restrictions Precautions Precautions: Fall Precaution Comments: R  hemi at baseline Required Braces or Orthoses: Other Brace Other Brace: R ankle brace from [facility] Restrictions Weight Bearing Restrictions: No     Mobility  Bed Mobility Overal bed mobility: Needs Assistance Bed Mobility: Rolling, Sidelying to Sit, Sit to Supine Rolling: Max assist Sidelying to sit: Max assist, HOB elevated   Sit to supine: Total assist        Transfers Overall transfer level: Needs assistance Equipment used: Ambulation equipment used Transfers: Sit to/from Stand, Bed to chair/wheelchair/BSC Sit to Stand: Mod assist, Max assist           General transfer comment: modA for sit to stand in STEDY initially, fatigues with further time upright. Pt stands 7 times during session Transfer via Lift Equipment: Stedy  Ambulation/Gait                   Stairs             Wheelchair Mobility    Modified Rankin (Stroke Patients Only) Modified Rankin (Stroke Patients Only) Pre-Morbid Rankin Score: Severe disability Modified Rankin: Severe disability     Balance Overall balance assessment: Needs assistance Sitting-balance support: Single extremity supported, Feet supported Sitting balance-Leahy Scale: Poor Sitting balance - Comments: minG-minA, posterior lean initially Postural control: Posterior lean Standing balance support: Single extremity supported Standing balance-Leahy Scale: Poor Standing balance comment: min-modA in STEDY with UE support of grab bar                            Cognition Arousal/Alertness: Awake/alert Behavior During Therapy: Flat affect Overall Cognitive Status: Impaired/Different from baseline  General Comments: pt with expressive aphasia, often responding with yes/no which are not always accurate. Pt does produce other words although still in short phrases and often not in response to PT questions/comments, but are appropriate within the scope of PT session         Exercises      General Comments General comments (skin integrity, edema, etc.): VSS on RA, pt is incontinent of stool. PT assists in hygiene tasks during session but appears to continue having a bowel movement despite PT efforts. RN made aware of need to assist pt with further pericare      Pertinent Vitals/Pain Pain Assessment Pain Assessment: Faces Faces Pain Scale: Hurts little more Pain Location: RUE Pain Descriptors / Indicators: Grimacing Pain Intervention(s): Monitored during session    Home Living                          Prior Function            PT Goals (current goals can now be found in the care plan section) Acute Rehab PT Goals Patient Stated Goal: to improve Progress towards PT goals: Progressing toward goals    Frequency    Min 3X/week      PT Plan Current plan remains appropriate    Co-evaluation              AM-PAC PT "6 Clicks" Mobility   Outcome Measure  Help needed turning from your back to your side while in a flat bed without using bedrails?: A Lot Help needed moving from lying on your back to sitting on the side of a flat bed without using bedrails?: A Lot Help needed moving to and from a bed to a chair (including a wheelchair)?: A Lot Help needed standing up from a chair using your arms (e.g., wheelchair or bedside chair)?: A Lot Help needed to walk in hospital room?: Total Help needed climbing 3-5 steps with a railing? : Total 6 Click Score: 10    End of Session Equipment Utilized During Treatment: Gait belt Activity Tolerance: Patient tolerated treatment well Patient left: in bed;with call bell/phone within reach;with bed alarm set;with family/visitor present Nurse Communication: Mobility status;Need for lift equipment PT Visit Diagnosis: Muscle weakness (generalized) (M62.81);Other abnormalities of gait and mobility (R26.89);Unsteadiness on feet (R26.81)     Time: 4944-9675 PT Time Calculation (min) (ACUTE  ONLY): 55 min  Charges:  $Therapeutic Activity: 53-67 mins                     Arlyss Gandy, PT, DPT Acute Rehabilitation Office [phone]    Arlyss Gandy 08/29/2022, 2:19 PM

## 2022-08-30 DIAGNOSIS — C439 Malignant melanoma of skin, unspecified: Secondary | ICD-10-CM | POA: Diagnosis not present

## 2022-08-30 DIAGNOSIS — I10 Essential (primary) hypertension: Secondary | ICD-10-CM | POA: Diagnosis not present

## 2022-08-30 DIAGNOSIS — L89309 Pressure ulcer of unspecified buttock, unspecified stage: Secondary | ICD-10-CM | POA: Diagnosis not present

## 2022-08-30 DIAGNOSIS — M1990 Unspecified osteoarthritis, unspecified site: Secondary | ICD-10-CM | POA: Diagnosis not present

## 2022-08-30 DIAGNOSIS — R531 Weakness: Secondary | ICD-10-CM | POA: Diagnosis not present

## 2022-08-30 DIAGNOSIS — K219 Gastro-esophageal reflux disease without esophagitis: Secondary | ICD-10-CM | POA: Diagnosis not present

## 2022-08-30 DIAGNOSIS — G9340 Encephalopathy, unspecified: Secondary | ICD-10-CM | POA: Diagnosis not present

## 2022-08-30 DIAGNOSIS — F418 Other specified anxiety disorders: Secondary | ICD-10-CM | POA: Diagnosis not present

## 2022-08-30 DIAGNOSIS — R4182 Altered mental status, unspecified: Secondary | ICD-10-CM | POA: Diagnosis not present

## 2022-08-30 DIAGNOSIS — I69351 Hemiplegia and hemiparesis following cerebral infarction affecting right dominant side: Secondary | ICD-10-CM | POA: Diagnosis not present

## 2022-08-30 DIAGNOSIS — L8990 Pressure ulcer of unspecified site, unspecified stage: Secondary | ICD-10-CM | POA: Diagnosis not present

## 2022-08-30 DIAGNOSIS — M6281 Muscle weakness (generalized): Secondary | ICD-10-CM | POA: Diagnosis not present

## 2022-08-30 DIAGNOSIS — U071 COVID-19: Secondary | ICD-10-CM | POA: Diagnosis not present

## 2022-08-30 DIAGNOSIS — E785 Hyperlipidemia, unspecified: Secondary | ICD-10-CM | POA: Diagnosis not present

## 2022-08-30 DIAGNOSIS — R2681 Unsteadiness on feet: Secondary | ICD-10-CM | POA: Diagnosis not present

## 2022-08-30 DIAGNOSIS — J449 Chronic obstructive pulmonary disease, unspecified: Secondary | ICD-10-CM | POA: Diagnosis not present

## 2022-08-30 DIAGNOSIS — I2510 Atherosclerotic heart disease of native coronary artery without angina pectoris: Secondary | ICD-10-CM | POA: Diagnosis not present

## 2022-08-30 DIAGNOSIS — Z7401 Bed confinement status: Secondary | ICD-10-CM | POA: Diagnosis not present

## 2022-08-30 DIAGNOSIS — R1312 Dysphagia, oropharyngeal phase: Secondary | ICD-10-CM | POA: Diagnosis not present

## 2022-08-30 DIAGNOSIS — L89156 Pressure-induced deep tissue damage of sacral region: Secondary | ICD-10-CM | POA: Diagnosis not present

## 2022-08-30 DIAGNOSIS — Z515 Encounter for palliative care: Secondary | ICD-10-CM | POA: Diagnosis not present

## 2022-08-30 DIAGNOSIS — Z7189 Other specified counseling: Secondary | ICD-10-CM | POA: Diagnosis not present

## 2022-08-30 DIAGNOSIS — Z8616 Personal history of COVID-19: Secondary | ICD-10-CM | POA: Diagnosis not present

## 2022-08-30 DIAGNOSIS — I69391 Dysphagia following cerebral infarction: Secondary | ICD-10-CM | POA: Diagnosis not present

## 2022-08-30 MED ORDER — ACETAMINOPHEN 325 MG PO TABS: 650.0000 mg | ORAL_TABLET | Freq: Four times a day (QID) | ORAL | Status: DC | PRN

## 2022-08-30 MED ORDER — ONDANSETRON 4 MG PO TBDP: 4.0000 mg | ORAL_TABLET | Freq: Four times a day (QID) | ORAL | 0 refills | Status: DC | PRN

## 2022-08-30 MED ORDER — POLYVINYL ALCOHOL 1.4 % OP SOLN: 1.0000 [drp] | Freq: Four times a day (QID) | OPHTHALMIC | 0 refills | Status: DC | PRN

## 2022-08-30 MED ORDER — OXYCODONE HCL 5 MG PO TABS: 5.0000 mg | ORAL_TABLET | ORAL | 0 refills | Status: AC | PRN

## 2022-08-30 MED ORDER — ORAL CARE MOUTH RINSE: 15.0000 mL | OROMUCOSAL | 0 refills | Status: DC | PRN

## 2022-08-30 MED ORDER — BIOTENE DRY MOUTH MT LIQD: 15.0000 mL | OROMUCOSAL | Status: DC | PRN

## 2022-08-30 MED ORDER — GLYCOPYRROLATE 1 MG PO TABS: 1.0000 mg | ORAL_TABLET | Freq: Three times a day (TID) | ORAL | 0 refills | Status: DC | PRN

## 2022-08-30 MED ORDER — LORAZEPAM 0.5 MG PO TABS: 0.5000 mg | ORAL_TABLET | ORAL | 0 refills | Status: DC | PRN

## 2022-08-30 NOTE — TOC Progression Note (Signed)
Transition of Care [facility]) - Progression Note    Patient Details  Name: David [name] MRN: [id number] Date of Birth: [birth year]  [facility]) CM/SW Contact  Baldemar Lenis, [place] Phone Number: 08/30/2022, 11:57 AM  Clinical Narrative:   CSW noting that patient did not qualify for residential hospice, plan for discharge to SNF. CSW asked MD to renew therapy orders for rehab recommendations. CSW also received call from [place] that patient is not service connected enough to cover SNF through the [place], will have to use his Medicare. CSW confirmed with [facility] that they will have a bed available for patient tomorrow when isolation is complete. CSW updated daughter, [name], via phone. CSW to follow.    Expected Discharge Plan: Skilled Nursing Facility Barriers to Discharge: Continued Medical Work up, SNF Covid  Expected Discharge Plan and Services     Post Acute Care Choice: Skilled Nursing Facility Living arrangements for the past 2 months: Single Family Home Expected Discharge Date: 08/30/22                                     Social Determinants of Health (SDOH) Interventions SDOH Screenings   Food Insecurity: No Food Insecurity (08/20/2022)  Housing: Low Risk  (08/20/2022)  Transportation Needs: No Transportation Needs (08/20/2022)  Utilities: Not At Risk (08/20/2022)  Alcohol Screen: Low Risk  (02/18/2022)  Depression (PHQ2-9): Medium Risk (06/14/2022)  Financial Resource Strain: Low Risk  (02/18/2022)  Physical Activity: Inactive (02/18/2022)  Social Connections: Socially Isolated (02/18/2022)  Stress: No Stress Concern Present (02/18/2022)  Tobacco Use: Medium Risk (08/22/2022)    Readmission Risk Interventions     No data to display

## 2022-08-30 NOTE — Discharge Summary (Signed)
Physician Discharge Summary  David [name]:[id number] DOB: 1924-01-16 DOA: 08/20/2022  PCP: Tresa Garter, MD  Admit date: 08/20/2022 Discharge date: 08/30/2022 Admitted From: Home Disposition: SNF Recommendations for Outpatient Follow-up:  [facility] to follow outpatient at SNF   Discharge Condition: Stable for transfer.  Guarded prognosis. 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  MRI brain without acute finding.  He tested positive for COVID-19.  No oxygen requirement.  CXR showed small layering left pleural effusion with mild streaky left basilar opacity related to atelectasis.  Briefly started on steroid.  Medically stable for discharge but remains physically deconditioned compared to his baseline.  Initially, plan was discharge to SNF once he completes isolation for COVID-19.  However, patient remained deconditioned, lethargic and confused.  After discussion with his daughters, palliative medicine consulted and transition to full comfort care on 08/26/2021.  Did not qualify for inpatient hospice.  Plan for transfer to SNF with palliative follow-up.  [facility] to follow patient at SNF  See individual problem list below for more.   Problems addressed during this hospitalization Principal Problem:   Acute encephalopathy Active Problems:   COVID-19 virus infection   Weakness   THROMBOCYTOPENIA   Hemiplegia and hemiparesis following cerebral infarction affecting right dominant side   Coronary artery disease involving native coronary artery of native heart without angina pectoris   S/P AVR (aortic valve replacement)   Dyslipidemia   Essential hypertension   COPD (chronic obstructive pulmonary disease)   GERD    Depression, major, single episode, moderate   End of life care   Pressure injury of skin   Goals of care, counseling/discussion   End-of-life care: Now full comfort care. -Rx: oxycodone, Ativan, glycopyrrolate and others for comfort care. -[facility] to follow patient at SNF   Acute encephalopathy due to COVID-19 infection/delirium: COVID-19 PCR positive on 4/6.  Other ends of the workup including MRI brain, ammonia, TSH, and B12 without significant finding.  Urine culture with >100,000 CFU of multiple Species.  Other infectious workup as above.  Low suspicion for seizure.  Remains confused.  Lethargic at times.  Guarded prognosis.  Palliative consulted and transitioned to full comfort care on 4/13.   COVID-19 Virus infection: Slightly elevated CRP and D-dimer.  No respiratory symptoms.   Generalized Weakness and Physical Debility worse than baseline.  Likely due to COVID-19 History of CVA with right hemiparesis and dysarthria: MRI brain without acute finding.    History of CAD/CABG: No anginal symptoms.  Serial troponin negative.    Paroxysmal SVT   Aortic stenosis s/p bioprosthetic AVR: Stable.   Dyslipidemia: Statin intolerant.  Last LDL 88.   Essential Hypertension: Normotensive for most part.   Chronic COPD: Stable.   Hyponatremia: Mild.  Monitor intermittently.   GERD   History of depression: Stable.    Hypoalbuminemia: Reactive in the setting of acute illness   Leukopenia/thrombocytopenia: Leukopenia resolved.  Thrombocytopenia stable.  Nutrition Problem: Inadequate oral intake Etiology: decreased appetite Signs/Symptoms: per patient/family report Interventions: Refer to RD note for recommendations     Time spent 45 minutes  Vital signs Vitals:   08/29/22 0828 08/29/22 1126 08/29/22 2007 08/30/22 0846  BP: 124/66 125/66 115/64 (!) 141/65  Pulse: 74 82 80 83  Temp: 98.7 F (37.1 C) 97.9 F (  36.6 C) 99.8 F (37.7 C) 98.2 F (36.8 C)  Resp: 18 18 18 16    Height:      Weight:      SpO2: 94% 94% 96% 92%  TempSrc: Oral Oral Axillary Oral  BMI (Calculated):         Discharge exam  GENERAL: No apparent distress.  Nontoxic. HEENT: MMM.  Vision and hearing grossly intact.  RESP: On room air.  No IWOB.  CVS:  RRR. Heart sounds normal.  ABD/GI/GU: BS+. Abd soft, NTND.  MSK/EXT: Right hemiparesis. SKIN: no apparent skin lesion or wound NEURO: Sleepy but wakes to voice.  Right hemiparesis PSYCH: Calm.  No distress or agitation.  Discharge Instructions Discharge Instructions     Diet general   Complete by: As directed    Increase activity slowly   Complete by: As directed    No wound care   Complete by: As directed       Allergies as of 08/30/2022       Reactions   Atorvastatin Other (See Comments)   Unknown reaction   Colesevelam Other (See Comments)   Unknown reaction   Simvastatin Other (See Comments)   Unknown reaction   Sulfa Antibiotics Other (See Comments)   Unknown reaction        Medication List     STOP taking these medications    Aspirin 81 81 MG tablet Generic drug: aspirin EC   atenolol 25 MG tablet Commonly known as: TENORMIN   clindamycin 1 % gel Commonly known as: CLINDAGEL   clotrimazole-betamethasone cream Commonly known as: LOTRISONE   escitalopram 10 MG tablet Commonly known as: LEXAPRO   fluconazole 100 MG tablet Commonly known as: Diflucan   OVER THE COUNTER MEDICATION   pantoprazole 40 MG tablet Commonly known as: PROTONIX   polyethylene glycol 17 g packet Commonly known as: MIRALAX / GLYCOLAX   senna-docusate 8.6-50 MG tablet Commonly known as: Senokot-S   VITAMIN D3 PO       TAKE these medications    acetaminophen 325 MG tablet Commonly known as: TYLENOL Take 2 tablets (650 mg total) by mouth every 6 (six) hours as needed for mild pain (or Fever >/= 101). What changed:  how much to take when to take this reasons to take this   antiseptic oral rinse  Liqd Apply 15 mLs topically as needed for dry mouth.   mouth rinse Liqd solution 15 mLs by Mouth Rinse route as needed (oral care).   glycopyrrolate 1 MG tablet Commonly known as: ROBINUL Take 1 tablet (1 mg total) by mouth 3 (three) times daily as needed (Oropharyngeal secretions).   LORazepam 0.5 MG tablet Commonly known as: Ativan Take 1 tablet (0.5 mg total) by mouth every 4 (four) hours as needed for up to 20 doses for anxiety.   ondansetron 4 MG disintegrating tablet Commonly known as: ZOFRAN-ODT Take 1 tablet (4 mg total) by mouth every 6 (six) hours as needed for nausea.   oxyCODONE 5 MG immediate release tablet Commonly known as: Roxicodone Take 1 tablet (5 mg total) by mouth every 4 (four) hours as needed for up to 7 days for moderate pain or severe pain.   polyvinyl alcohol 1.4 % ophthalmic solution Commonly known as: LIQUIFILM TEARS Place 1 drop into both eyes 4 (four) times daily as needed for dry eyes.        Consultations: Palliative medicine  Procedures/Studies:   DG CHEST PORT 1 VIEW  Result Date: 08/23/2022 CLINICAL DATA:  Shortness of breath EXAM: PORTABLE CHEST 1 VIEW COMPARISON:  CXR 08/20/22 FINDINGS: Status post median sternotomy CABG. Status post aortic valve repair. Small left pleural effusion, unchanged from prior exam. Right lung is normal in appearance. Upper abdomen is notable for nondistended enteric contrast opacified colon. No radiographically apparent displaced rib fractures. IMPRESSION: Small left pleural effusion, unchanged from prior exam. Electronically Signed   By: Lorenza Cambridge M.D.   On: 08/23/2022 08:07   DG Swallowing Func-Speech Pathology  Result Date: 08/22/2022 Table formatting from the original result was not included. Modified Barium Swallow Study Patient Details Name: EZRIEL BOFFA MRN: [id number] Date of Birth: [birth year] Today's Date: 08/22/2022 HPI/PMH: HPI: HILBERT BRIGGS is a 89 y.o. male who presented to ED with complaints of  speech difficulty worse than baseline. Found to be COVID+. Family reports decreased PO intake.  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 Hx of silent aspiraiton per MBS x2 in 2021 with CIR following CVA. Clinical Impression: Clinical Impression: Pt presents with generally functional oropharyngeal swallowing with mild changes. He has a collection of oral residue on his tongue with solids that he clears spontaneously with a second swallow. His swallow initiates at the pyriform sinuses with thin liquids, and at times he has trace penetration with thin liquids that is consistently cleared with cup sips as he goes on to achieve adequate laryngeal vestibule closure. A trace amount of thin liquids via straw did reach as far as his pyriform sinuses, and then fell below his vocal folds to become silent aspiration as he went to take his next bolus. When cued to clear his throat, this also appeared to clear. Note that pt also had retention of barium including the tablet in the distal esophagus, where his anatomy was more tortuous, although this test is not diagnostic of the esophagus. Recommend that pt remain on regular diet and thin liquids. Could consider focusing on cup sips or offering meds in puree. Factors that may increase risk of adverse event in presence of aspiration [name] & [name] 2021): Factors that may increase risk of adverse event in presence of aspiration [name] & [name] 2021): Frail or deconditioned Recommendations/Plan: Swallowing Evaluation Recommendations Swallowing Evaluation Recommendations Recommendations: PO diet PO Diet Recommendation: Regular; Thin liquids (Level 0) Liquid Administration via: Cup Medication Administration: Crushed with puree (may want to crush larger pills) Supervision: Staff to assist with self-feeding Swallowing strategies  : Slow rate; Small bites/sips Postural  changes: Stay upright 30-60 min after meals; Position pt fully upright for meals Oral care recommendations: Oral care BID (2x/day) Treatment Plan Treatment Plan Treatment recommendations: Therapy as outlined in treatment plan below Follow-up recommendations: No SLP follow up Functional status assessment: Patient has had a recent decline in their functional status and demonstrates the ability to make significant improvements in function in a reasonable and predictable amount of time. Treatment frequency: Min 1x/week Treatment duration: 1 week Interventions: Aspiration precaution training; Compensatory techniques; Patient/family education; Trials of upgraded texture/liquids Recommendations Recommendations for follow up therapy are one component of a multi-disciplinary discharge planning process, led by the attending physician.  Recommendations may be updated based on patient status, additional functional criteria and insurance authorization. Assessment: Orofacial Exam: Orofacial Exam Oral Cavity - Dentition: Adequate natural dentition Orofacial Anatomy: WFL Anatomy: Anatomy: Suspected cervical osteophytes; Other (Comment) (distal esophagus appeared to be tortuous) Boluses Administered: Boluses Administered Boluses Administered: Thin liquids (Level  0); Mildly thick liquids (Level 2, nectar thick); Moderately thick liquids (Level 3, honey thick); Puree; Solid  Oral Impairment Domain: Oral Impairment Domain Lip Closure: No labial escape Tongue control during bolus hold: Posterior escape of greater than half of bolus (cognitively did not follow instructions) Bolus preparation/mastication: Timely and efficient chewing and mashing Bolus transport/lingual motion: Brisk tongue motion Oral residue: Residue collection on oral structures Location of oral residue : Tongue; Palate Initiation of pharyngeal swallow : Pyriform sinuses  Pharyngeal Impairment Domain: Pharyngeal Impairment Domain Soft palate elevation: No bolus between  soft palate (SP)/pharyngeal wall (PW) Laryngeal elevation: Complete superior movement of thyroid cartilage with complete approximation of arytenoids to epiglottic petiole Anterior hyoid excursion: Partial anterior movement Epiglottic movement: Complete inversion Laryngeal vestibule closure: Incomplete, narrow column air/contrast in laryngeal vestibule Pharyngeal stripping wave : Present - complete Pharyngeal contraction (A/P view only): N/A Pharyngoesophageal segment opening: Complete distension and complete duration, no obstruction of flow Tongue base retraction: No contrast between tongue base and posterior pharyngeal wall (PPW) Pharyngeal residue: Trace residue within or on pharyngeal structures Location of pharyngeal residue: Valleculae  Esophageal Impairment Domain: Esophageal Impairment Domain Esophageal clearance upright position: Esophageal retention Pill: Esophageal Impairment Domain Esophageal clearance upright position: Esophageal retention Penetration/Aspiration Scale Score: Penetration/Aspiration Scale Score 1.  Material does not enter airway: Puree; Solid; Pill; Moderately thick liquids (Level 3, honey thick) 2.  Material enters airway, remains ABOVE vocal cords then ejected out: Mildly thick liquids (Level 2, nectar thick) 8.  Material enters airway, passes BELOW cords without attempt by patient to eject out (silent aspiration) : Thin liquids (Level 0) Compensatory Strategies: Compensatory Strategies Compensatory strategies: Yes Straw: Ineffective Ineffective Straw: Thin liquid (Level 0)   General Information: Caregiver present: No  Diet Prior to this Study: Regular; Thin liquids (Level 0)   Temperature : Normal   Respiratory Status: WFL   Supplemental O2: None (Room air)   History of Recent Intubation: No  Behavior/Cognition: Alert; Cooperative; Pleasant mood Self-Feeding Abilities: Needs assist with self-feeding Baseline vocal quality/speech: Normal Volitional Cough: Able to elicit Volitional  Swallow: Able to elicit No data recorded Goal Planning: Prognosis for improved oropharyngeal function: Fair Barriers to Reach Goals: Time post onset No data recorded Patient/Family Stated Goal: would like MBS Consulted and agree with results and recommendations: Patient; Nurse; Physician Pain: Pain Assessment Pain Assessment: Faces Faces Pain Scale: 0 Pain Intervention(s): Monitored during session; Repositioned End of Session: Start Time:SLP Start Time (ACUTE ONLY): 1520 Stop Time: SLP Stop Time (ACUTE ONLY): 1557 Time Calculation:SLP Time Calculation (min) (ACUTE ONLY): 37 min Charges: SLP Evaluations $ SLP Speech Visit: 1 Visit SLP Evaluations $BSS Swallow: 1 Procedure $MBS Swallow: 1 Procedure SLP visit diagnosis: SLP Visit Diagnosis: Dysphagia, oropharyngeal phase (R13.12) Past Medical History: Past Medical History: Diagnosis Date  CAD (coronary artery disease)   intolerance of statins  COPD (chronic obstructive pulmonary disease)   Current use of long term anticoagulation   GERD (gastroesophageal reflux disease)   Hyperlipidemia   Hypertension   Impaired glucose tolerance 02/14/2011  LBP (low back pain)   OA (osteoarthritis)   S/P AVR (aortic valve replacement) 2009  bonine valve Past Surgical History: Past Surgical History: Procedure Laterality Date  AORTIC VALVE REPLACEMENT  01/15/1999  bovine  CARDIAC CATHETERIZATION  10/05/1998  Recommend CABG revascularizaition  CARDIAC CATHETERIZATION  06/11/1999  Continue medical therapy  CARDIAC CATHETERIZATION  01/08/2007  Patent grafts, recommend medical therapy  CARDIOVASCULAR STRESS TEST  04/22/2010  Small lateral defect which is partially reversible, no  ischemic EKG changes were noted.  CAROTID DOPPLER  04/22/2010  Bilateral ICAs-no evidence of diametere reduction, significant tortuosity, or any other vascular abnormality.  CORONARY ARTERY BYPASS GRAFT  10/09/1998  LIMA to LAD, SVG to diag, SVG sequential to 2 marginals, SVG sequential to PDA & PLA. CE#25 porcine AVR.   TRANSTHORACIC ECHOCARDIOGRAM  05/14/2012  EF 50-55%, bioprosthesis of the aortic valve with well preserved LV function, moderate concentric LVGH Mahala Menghini., M.A. CCC-SLP Acute Rehabilitation Services Office [phone] Secure chat preferred 08/22/2022, 5:15 PM  MR BRAIN WO CONTRAST  Result Date: 08/21/2022 CLINICAL DATA:  Stroke suspected EXAM: MRI HEAD WITHOUT CONTRAST TECHNIQUE: Multiplanar, multiecho pulse sequences of the brain and surrounding structures were obtained without intravenous contrast. COMPARISON:  09/05/2019 MRI head, correlation is also made with 08/20/2022 CT head FINDINGS: Brain: No restricted diffusion to suggest acute or subacute infarct. No acute hemorrhage, mass, mass effect, or midline shift. No hydrocephalus or extra-axial collection. Encephalomalacia and hemosiderin deposition along the left lentiform nucleus and external capsule, consistent with prior parenchymal hemorrhage. Hemosiderin deposition is also noted in occipital horns and associated with a right posterior lentiform nucleus lacunar infarct. Confluent T2 hyperintense signal in the periventricular white matter, likely the sequela of moderate chronic small vessel ischemic disease. Vascular: Normal arterial flow voids. Skull and upper cervical spine: Normal marrow signal. Sinuses/Orbits: Clear paranasal sinuses. Status post bilateral lens replacements. Other: The mastoids are well aerated. IMPRESSION: No acute intracranial process. No evidence of acute or subacute infarct. Electronically Signed   By: Wiliam Ke M.D.   On: 08/21/2022 02:15   CT HEAD WO CONTRAST  Result Date: 08/20/2022 CLINICAL DATA:  Mental status change, persistent or worsening. EXAM: CT HEAD WITHOUT CONTRAST TECHNIQUE: Contiguous axial images were obtained from the base of the skull through the vertex without intravenous contrast. RADIATION DOSE REDUCTION: This exam was performed according to the departmental dose-optimization program which includes  automated exposure control, adjustment of the mA and/or kV according to patient size and/or use of iterative reconstruction technique. COMPARISON:  Prior head CT examinations 09/23/2019 FINDINGS: Brain: Moderate generalized cerebral atrophy. Cavity centered within the posterior limb of left internal capsule at site of prior parenchymal hemorrhage. Chronic small-vessel infarcts within bilateral basal ganglia. Lacunar infarct within the left thalamus, new from the prior head CT of 09/23/2019 but otherwise age indeterminate. Background moderate patchy and ill-defined hypoattenuation within the cerebral white matter, nonspecific but compatible with chronic small vessel ischemic disease. There is no acute intracranial hemorrhage. No extra-axial fluid collection. No evidence of an intracranial mass. No midline shift. Vascular: No hyperdense vessel.  Atherosclerotic calcifications. Skull: No fracture or aggressive osseous lesion. Sinuses/Orbits: No mass or acute finding within the imaged orbits. No significant paranasal sinus disease at the imaged levels. IMPRESSION: 1. Lacunar infarct within the left thalamus, new from the prior head CT of 09/23/2019 but otherwise age indeterminate. Consider a brain MRI for further evaluation. 2. Cavity centered within the posterior limb of left internal capsule at site of prior parenchymal hemorrhage. 3. Background parenchymal atrophy, chronic small vessel ischemic disease and chronic small-vessel infarcts as described. Electronically Signed   By: Jackey Loge D.O.   On: 08/20/2022 13:17   DG Chest Port 1 View  Result Date: 08/20/2022 CLINICAL DATA:  Altered mental status EXAM: PORTABLE CHEST 1 VIEW COMPARISON:  09/28/2020 FINDINGS: Stable heart size status post sternotomy and CABG. Prior cardiac valve replacement. Aortic atherosclerosis. Low lung volumes. Small layering left-sided pleural effusion. Mild streaky left basilar opacity. No  pneumothorax. IMPRESSION: Small layering  left-sided pleural effusion with mild streaky left basilar opacity, likely atelectasis. Electronically Signed   By: Duanne Guess D.O.   On: 08/20/2022 12:49       The results of significant diagnostics from this hospitalization (including imaging, microbiology, ancillary and laboratory) are listed below for reference.     Microbiology: Recent Results (from the past 240 hour(s))  Blood culture (routine x 2)     Status: None   Collection Time: 08/20/22 12:22 PM   Specimen: BLOOD  Result Value Ref Range Status   Specimen Description BLOOD SITE NOT SPECIFIED  Final   Special Requests   Final    BOTTLES DRAWN AEROBIC AND ANAEROBIC Blood Culture results may not be optimal due to an inadequate volume of blood received in culture bottles   Culture   Final    NO GROWTH 5 DAYS Performed at [facility], [address], [place], [place] [address]    Report Status 08/25/2022 FINAL  Final  Blood culture (routine x 2)     Status: None   Collection Time: 08/20/22 12:30 PM   Specimen: BLOOD RIGHT ARM  Result Value Ref Range Status   Specimen Description BLOOD RIGHT ARM  Final   Special Requests   Final    BOTTLES DRAWN AEROBIC AND ANAEROBIC Blood Culture adequate volume   Culture   Final    NO GROWTH 5 DAYS Performed at [facility], [address], [place], [place] [address]    Report Status 08/25/2022 FINAL  Final  Resp panel by RT-PCR (RSV, Flu A&B, Covid) Anterior Nasal Swab     Status: Abnormal   Collection Time: 08/20/22  1:24 PM   Specimen: Anterior Nasal Swab  Result Value Ref Range Status   SARS Coronavirus 2 by RT PCR POSITIVE (A) NEGATIVE Final   Influenza A by PCR NEGATIVE NEGATIVE Final   Influenza B by PCR NEGATIVE NEGATIVE Final    Comment: (NOTE) The Xpert Xpress SARS-CoV-2/FLU/RSV plus assay is intended as an aid in the diagnosis of influenza from Nasopharyngeal swab specimens and should not be used as a sole basis for treatment. Nasal washings  and aspirates are unacceptable for Xpert Xpress SARS-CoV-2/FLU/RSV testing.  Fact Sheet for Patients: BloggerCourse.com  Fact Sheet for Healthcare Providers: SeriousBroker.it  This test is not yet approved or cleared by the [place] FDA and has been authorized for detection and/or diagnosis of SARS-CoV-2 by FDA under an Emergency Use Authorization (EUA). This EUA will remain in effect (meaning this test can be used) for the duration of the COVID-19 declaration under Section 564(b)(1) of the Act, 21 U.S.C. section 360bbb-3(b)(1), unless the authorization is terminated or revoked.     Resp Syncytial Virus by PCR NEGATIVE NEGATIVE Final    Comment: (NOTE) Fact Sheet for Patients: BloggerCourse.com  Fact Sheet for Healthcare Providers: SeriousBroker.it  This test is not yet approved or cleared by the [place] FDA and has been authorized for detection and/or diagnosis of SARS-CoV-2 by FDA under an Emergency Use Authorization (EUA). This EUA will remain in effect (meaning this test can be used) for the duration of the COVID-19 declaration under Section 564(b)(1) of the Act, 21 U.S.C. section 360bbb-3(b)(1), unless the authorization is terminated or revoked.  Performed at [facility], [address] [place], [place] [address]   Urine Culture (for pregnant, neutropenic or urologic patients or patients with an indwelling urinary catheter)     Status: Abnormal  Collection Time: 08/20/22  2:35 PM   Specimen: Urine, Clean Catch  Result Value Ref Range Status   Specimen Description URINE, CLEAN CATCH  Final   Special Requests   Final    ADDED 2140 Performed at [facility], [address], [place], [place] [address]    Culture (A)  Final    >=100,000 COLONIES/mL MULTIPLE SPECIES PRESENT, SUGGEST RECOLLECTION   Report Status 08/22/2022 FINAL  Final      Labs:  CBC: Recent Labs  Lab 08/24/22 0605 08/25/22 0417 08/26/22 1330  WBC 4.2 6.2 6.6  NEUTROABS 3.0  --   --   HGB 14.9 14.4 14.1  HCT 41.3 41.2 41.0  MCV 86.6 87.3 89.3  PLT 104* 100* 91*   BMP &GFR Recent Labs  Lab 08/24/22 0451 08/26/22 1330  NA 132* 134*  K 4.3 4.1  CL 101 101  CO2 23 26  GLUCOSE 169* 106*  BUN 14 14  CREATININE 1.00 1.12  CALCIUM 8.4* 8.2*  MG 2.3 1.8  PHOS 2.5 2.3*   Estimated Creatinine Clearance: 38.9 mL/min (by C-G formula based on SCr of 1.12 mg/dL). Liver & Pancreas: Recent Labs  Lab 08/24/22 0451 08/26/22 1330  AST 20 21  ALT 13 16  ALKPHOS 46 43  BILITOT 0.8 0.8  PROT 6.1* 5.6*  ALBUMIN 2.7* 2.4*   No results for input(s): "LIPASE", "AMYLASE" in the last 168 hours. Recent Labs  Lab 08/26/22 1330  AMMONIA 20   Diabetic: No results for input(s): "HGBA1C" in the last 72 hours. No results for input(s): "GLUCAP" in the last 168 hours. Cardiac Enzymes: Recent Labs  Lab 08/26/22 1330  CKTOTAL 76   No results for input(s): "PROBNP" in the last 8760 hours. Coagulation Profile: No results for input(s): "INR", "PROTIME" in the last 168 hours. Thyroid Function Tests: No results for input(s): "TSH", "T4TOTAL", "FREET4", "T3FREE", "THYROIDAB" in the last 72 hours. Lipid Profile: No results for input(s): "CHOL", "HDL", "LDLCALC", "TRIG", "CHOLHDL", "LDLDIRECT" in the last 72 hours. Anemia Panel: No results for input(s): "VITAMINB12", "FOLATE", "FERRITIN", "TIBC", "IRON", "RETICCTPCT" in the last 72 hours. Urine analysis:    Component Value Date/Time   COLORURINE YELLOW 08/20/2022 1435   APPEARANCEUR CLEAR 08/20/2022 1435   LABSPEC 1.023 08/20/2022 1435   PHURINE 5.0 08/20/2022 1435   GLUCOSEU NEGATIVE 08/20/2022 1435   GLUCOSEU NEGATIVE 02/11/2022 0946   HGBUR NEGATIVE 08/20/2022 1435   BILIRUBINUR NEGATIVE 08/20/2022 1435   KETONESUR NEGATIVE 08/20/2022 1435   PROTEINUR NEGATIVE 08/20/2022 1435   UROBILINOGEN 2.0  (A) 02/11/2022 0946   NITRITE NEGATIVE 08/20/2022 1435   LEUKOCYTESUR NEGATIVE 08/20/2022 1435   Sepsis Labs: Invalid input(s): "PROCALCITONIN", "LACTICIDVEN"   SIGNED:  Almon Hercules, MD  [facility] 08/30/2022, 9:26 AM

## 2022-08-30 NOTE — TOC Transition Note (Signed)
Transition of Care [facility]) - CM/SW Discharge Note   Patient Details  Name: David [name] MRN: [id number] Date of Birth: 1923-08-12  Transition of Care [facility]) CM/SW Contact:  Baldemar Lenis, LCSW Phone Number: 08/30/2022, 12:00 PM   Clinical Narrative:   CSW confirmed that patient is cleared of isolation precautions, sent discharge to [facility]. CSW updated daughter, [name], she is in agreement to transition to [facility] today. Transport arranged with [facility] for next available.  CSW also contacted by [facility] that patient was already active with them. CSW alerted [facility] that patient is already active with a palliative provider.   Nurse to call report to [phone], Room 504a.    Final next level of care: Skilled Nursing Facility Barriers to Discharge: Barriers Resolved   Patient Goals and CMS Choice CMS Medicare.gov Compare Post Acute Care list provided to:: Patient Represenative (must comment) Choice offered to / list presented to : Adult Children  Discharge Placement                Patient chooses bed at: [facility] Patient to be transferred to facility by: [facility] Name of family member notified: [name] Patient and family notified of of transfer: 08/30/22  Discharge Plan and Services Additional resources added to the After Visit Summary for       Post Acute Care Choice: Skilled Nursing Facility                               Social Determinants of Health (SDOH) Interventions SDOH Screenings   Food Insecurity: No Food Insecurity (08/20/2022)  Housing: Low Risk  (08/20/2022)  Transportation Needs: No Transportation Needs (08/20/2022)  Utilities: Not At Risk (08/20/2022)  Alcohol Screen: Low Risk  (02/18/2022)  Depression (PHQ2-9): Medium Risk (06/14/2022)  Financial Resource Strain: Low Risk  (02/18/2022)  Physical Activity: Inactive (02/18/2022)  Social Connections: Socially Isolated (02/18/2022)  Stress: No Stress Concern Present (02/18/2022)   Tobacco Use: Medium Risk (08/22/2022)     Readmission Risk Interventions     No data to display

## 2022-09-01 DIAGNOSIS — G9340 Encephalopathy, unspecified: Secondary | ICD-10-CM | POA: Diagnosis not present

## 2022-09-01 DIAGNOSIS — L8990 Pressure ulcer of unspecified site, unspecified stage: Secondary | ICD-10-CM | POA: Diagnosis not present

## 2022-09-01 DIAGNOSIS — C439 Malignant melanoma of skin, unspecified: Secondary | ICD-10-CM | POA: Diagnosis not present

## 2022-09-01 DIAGNOSIS — L89156 Pressure-induced deep tissue damage of sacral region: Secondary | ICD-10-CM | POA: Diagnosis not present

## 2022-09-02 ENCOUNTER — Encounter: Payer: Self-pay | Admitting: Family Medicine

## 2022-09-02 ENCOUNTER — Non-Acute Institutional Stay: Payer: Medicare Other | Admitting: Family Medicine

## 2022-09-02 VITALS — BP 106/72 | HR 103 | Temp 97.8°F | Resp 18

## 2022-09-02 DIAGNOSIS — E441 Mild protein-calorie malnutrition: Secondary | ICD-10-CM

## 2022-09-02 DIAGNOSIS — I69351 Hemiplegia and hemiparesis following cerebral infarction affecting right dominant side: Secondary | ICD-10-CM

## 2022-09-02 DIAGNOSIS — R531 Weakness: Secondary | ICD-10-CM

## 2022-09-02 DIAGNOSIS — I69391 Dysphagia following cerebral infarction: Secondary | ICD-10-CM

## 2022-09-02 DIAGNOSIS — D696 Thrombocytopenia, unspecified: Secondary | ICD-10-CM

## 2022-09-02 DIAGNOSIS — R4701 Aphasia: Secondary | ICD-10-CM

## 2022-09-02 NOTE — Progress Notes (Signed)
Therapist, nutritional Palliative Care Consult Note Telephone: [phone]  Fax: [phone]   Date of encounter: 09/02/22 10:06 AM PATIENT NAME: David [name] [address] [place] [address]   [phone] (home)  DOB: Mar 29, 1924 MRN: [id number] PRIMARY CARE PROVIDER:    Tresa Garter, MD,  [address] [place] [address] [phone]  REFERRING PROVIDER:   Tresa Garter, MD [address] [place],  [place] [address] [phone]  Emergency Contact:    Contact Information     Name Relation Home Work [place] Daughter [phone]  [phone]   [name] Daughter   [phone]   [name], [name] [phone]  [phone]       Health Care Agent-daughter [name]   I met face to face with patient in [facility] facility. Palliative Care was asked to follow this patient by consultation request of Plotnikov, Georgina Quint, MD to address advance care planning and complex medical decision making. This is an initial visit.  ADVANCE CARE PLANNING/GOALS OF CARE: Patient/health care surrogate gave his/her permission to discuss.  Review of an advance directive document-MOST and Living Will. Living will designates that [facility] POA can authorize withholding or withdrawal of life sustaining procedures when and if pt is terminally ill, persistently in a coma, suffer severe dementia or is in a persistent vegetative state.  Living will also expresses patient wish not to have life prolonged with life sustaining procedures in these situations.  It also states that he does want to receive artificial hydration and nutrition. Form is signed, dated 03/12/10 and has been notarized.  CODE STATUS: MOST as of 08/28/22 DNR/DNI with comfort measures No  antibiotics, IV fluids or feeding tube.    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 Agree PT/OT to help improve to baseline Fall precautions Recommend teaching family use of gait belt for transfers when at home. Encourage OOB for all meals and for at least a portion of the day to help build strength   Dysphagia Reinforced safe swallow strategies of following recommended dysphagia diet, cutting into small pieces and moistening foods with gravies/sauces. Recommend alternating sips/bites, no straws. Upright and alert for all intake and for 60 minutes after. Recommend checking for pocketed food after eating. Use of chin tuck if choking to help facilitate swallow. Continue ST eval and treat.   Mild protein calorie malnutrition/Hypophosphatemia Last Albumin 2.4 on 08/26/22, Phosphorous 2.3. Recommend follow up with nutritionist/dietitian to address protein intake and slow normalization of phosphorous without refeeding syndrome.   Weakness OOB TID for at least 1-2 hours at a time. Continue PT/OT and encourage pt to do whatever he can for himself.  Thrombocytopenia Scattered ecchymosis of hands.  Chronic/stable.  Global Aphasia Continue work with ST to determine if able to improve.   Follow up Palliative Care Visit:  Palliative Care continuing to follow up by monitoring for changes in appetite, weight, functional and cognitive status for chronic disease progression and management in agreement with patient's stated goals of care. Next visit in 2-3 weeks or prn.  This visit was coded based on medical decision making (MDM).  Chief Complaint  Palliative Care received a referral to follow patient at SNF following hospitalization with generalized debility, increased confusion secondary to Covid 19 infection.  HISTORY OF PRESENT ILLNESS: David [name]  is a 89 y.o. 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 Prior to admission to the hospital with covid 19, pt was able to stand/pivot and ambulate with  max assistance for short distances.  Currently he is weak and unable to ambulate.  He has had some dysarthria since his stroke and hemorrhage but his daughter has been able to understand him until when he most recently became ill.  Pt required assistance with bathing and dressing but was able to feed himself.  He had taught himself how to feed himself with his left hand and can write some.  He did have some issues with dysphagia prior to his most recent hospitalization.  Daughter [name] lives with pt and provides care for him with the assistance of a personal care aide for part time help.  She noted that pt began to have some increased cough, congestion, worsening dysphagia. He was having increased sputum production.  He had been able to swallow as long as foods were not tough/chewy and were cut up fine. She began having worse difficulty understanding his speech then he began mumbling unintelligible words, he became significantly weaker and more confused not recognizing her or other family members.  He is able to say "Yes or no" intelligibly and the remainder of his speech is garbled.  He has been [facility] bound and they have the necessary hospital bed, bedside commode. He is currently receiving PT, OT and ST at the facility.  Daughter states he continues to cough some and that ST has recommended no straws. At home she had him on a toileting schedule for both bowel and bladder so he was able to control it.  Daughter states he doesn't ever eat a lot but has been eating good. If pt has enough strength plan is to return home with daughter.   ACTIVITIES OF DAILY LIVING: CONTINENT OF BLADDER/ BOWEL? Yes BATHING/DRESSING/FEEDING: Assistance with all except feeding  MOBILITY:   WHEELCHAIR or BEDBOUND, not able to stand/pivot presently  APPETITE? good WEIGHT: 179 lbs 0.2 oz as of 08/20/22, weight 02/18/22 was 188 lbs  CURRENT PROBLEM LIST:  Patient Active Problem List   Diagnosis Date Noted   Goals of care,  counseling/discussion 08/28/2022   End of life care 08/27/2022   Pressure injury of skin 08/27/2022   Acute encephalopathy 08/20/2022   COVID-19 virus infection 08/20/2022   Weakness 08/20/2022   Intertrigo 02/10/2022   Aortic valve disorder 08/17/2021   Chronic ischemic heart disease 08/17/2021   Foot drop, right foot 08/17/2021   Hemiplegia and hemiparesis following cerebral infarction affecting right dominant side 08/17/2021   Impacted cerumen, right ear 08/17/2021   Nail dystrophy 08/17/2021   Need for assistance with personal care 08/17/2021   Other specified problems related to psychosocial circumstances 08/17/2021   Pressure ulcer of unspecified buttock, unspecified stage 08/17/2021   Atherosclerosis of aorta 09/28/2020   Adhesive capsulitis of right shoulder 08/19/2020   Situational anxiety 06/29/2020   Hyperkeratosis 01/29/2020   Polycythemia 01/03/2020   Elevated LFTs 01/03/2020   Spastic hemiparesis of right dominant side 10/30/2019   Mass of heart valve 10/17/2019   Depression, major, single episode, moderate 10/17/2019   Cognitive and neurobehavioral dysfunction    Labile blood pressure    Dysphagia, post-stroke    AKI (acute kidney injury)    Hyperglycemia    Coronary artery disease involving native coronary artery of native heart without angina pectoris    Chronic obstructive pulmonary disease    Tremors of nervous system  S/P AVR    Thrombocytopenia    Global aphasia    ICH (intracerebral hemorrhage) 09/04/2019   Tremor 05/20/2019   Pulmonary valve vegetation 09/20/2018   Chest pain, atypical 09/20/2018   Onychomycosis 06/06/2018   Acute pain of left knee 08/18/2017   Wart of hand 06/05/2017   Acute upper respiratory infection 05/12/2016   Skin irritation from shaving 12/02/2015   Benign paroxysmal positional vertigo 03/09/2015   Plantar fasciitis, bilateral 12/02/2014   Constipation 07/17/2014   Cough 04/09/2014   S/P AVR (aortic valve replacement)  11/04/2013   Elevated hemoglobin (HCC) 12/14/2012   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   Impaired glucose tolerance 02/14/2011   Edema 10/22/2010   Actinic keratoses 10/22/2010   THROMBOCYTOPENIA 01/22/2010   MELANOMA 03/05/2009   TOBACCO USE, QUIT 02/27/2009   Neoplasm of uncertain behavior of skin 05/08/2007   Dyslipidemia 02/08/2007   Essential hypertension 02/08/2007   Coronary atherosclerosis 02/08/2007   COPD (chronic obstructive pulmonary disease) 02/08/2007   GERD 02/08/2007   Osteoarthritis 02/08/2007   LOW BACK PAIN 02/08/2007   Rash 01/29/2007   PAST MEDICAL HISTORY:  Active Ambulatory Problems    Diagnosis Date Noted   MELANOMA 03/05/2009   Neoplasm of uncertain behavior of skin 05/08/2007   Dyslipidemia 02/08/2007   THROMBOCYTOPENIA 01/22/2010   Essential hypertension 02/08/2007   Coronary atherosclerosis 02/08/2007   COPD (chronic obstructive pulmonary disease) 02/08/2007   GERD 02/08/2007   Osteoarthritis 02/08/2007   LOW BACK PAIN 02/08/2007   Rash 01/29/2007   TOBACCO USE, QUIT 02/27/2009   Edema 10/22/2010   Actinic keratoses 10/22/2010   Impaired glucose tolerance 02/14/2011   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   Elevated hemoglobin (HCC) 12/14/2012   S/P AVR (aortic valve replacement) 11/04/2013   Cough 04/09/2014   Constipation 07/17/2014   Plantar fasciitis, bilateral 12/02/2014   Benign paroxysmal positional vertigo 03/09/2015   Skin irritation from shaving 12/02/2015   Acute upper respiratory infection 05/12/2016   Wart of hand 06/05/2017   Acute pain of left knee 08/18/2017   Onychomycosis 06/06/2018   Pulmonary valve vegetation 09/20/2018   Chest pain, atypical 09/20/2018   Tremor 05/20/2019   ICH (intracerebral hemorrhage) 09/04/2019   Coronary artery disease involving native coronary artery of native heart without angina pectoris    Chronic obstructive pulmonary disease    Tremors of nervous system    S/P AVR    Thrombocytopenia     Global aphasia    Dysphagia, post-stroke    AKI (acute kidney injury)    Hyperglycemia    Labile blood pressure    Cognitive and neurobehavioral dysfunction    Mass of heart valve 10/17/2019   Depression, major, single episode, moderate 10/17/2019   Spastic hemiparesis of right dominant side 10/30/2019   Polycythemia 01/03/2020   Elevated LFTs 01/03/2020   Hyperkeratosis 01/29/2020   Situational anxiety 06/29/2020   Adhesive capsulitis of right shoulder 08/19/2020   Atherosclerosis of aorta 09/28/2020   Aortic valve disorder 08/17/2021   Chronic ischemic heart disease 08/17/2021   Foot drop, right foot 08/17/2021   Hemiplegia and hemiparesis following cerebral infarction affecting right dominant side 08/17/2021   Impacted cerumen, right ear 08/17/2021   Nail dystrophy 08/17/2021   Need for assistance with personal care 08/17/2021   Other specified problems related to psychosocial circumstances 08/17/2021   Pressure ulcer of unspecified buttock, unspecified stage 08/17/2021   Intertrigo 02/10/2022   Acute encephalopathy 08/20/2022   COVID-19 virus infection 08/20/2022   Weakness 08/20/2022   End  of life care 08/27/2022   Pressure injury of skin 08/27/2022   Goals of care, counseling/discussion 08/28/2022   Resolved Ambulatory Problems    Diagnosis Date Noted   Actinic keratosis 04/03/2007   Cough 01/29/2007   ABNORMAL GLUCOSE NEC 02/08/2007   Well adult exam 11/12/2012   Dysarthria as late effect of cerebrovascular disease 10/17/2019   Past Medical History:  Diagnosis Date   CAD (coronary artery disease)    Current use of long term anticoagulation    GERD (gastroesophageal reflux disease)    Hyperlipidemia    Hypertension    LBP (low back pain)    OA (osteoarthritis)    SOCIAL HX:  Social History   Tobacco Use   Smoking status: Former    Types: Pipe    Quit date: 05/16/1998    Years since quitting: 24.3    Passive exposure: Past   Smokeless tobacco: Former   Substance Use Topics   Alcohol use: No   FAMILY HX:  Family History  Problem Relation Age of Onset   Coronary artery disease Other    Hypertension Other       Preferred Pharmacy: ALLERGIES:  Allergies  Allergen Reactions   Atorvastatin Other (See Comments)    Unknown reaction   Colesevelam Other (See Comments)    Unknown reaction   Simvastatin Other (See Comments)    Unknown reaction   Sulfa Antibiotics Other (See Comments)    Unknown reaction     PERTINENT MEDICATIONS:  Outpatient Encounter Medications as of 09/02/2022  Medication Sig   acetaminophen (TYLENOL) 325 MG tablet Take 2 tablets (650 mg total) by mouth every 6 (six) hours as needed for mild pain (or Fever >/= 101).   antiseptic oral rinse (BIOTENE) LIQD Apply 15 mLs topically as needed for dry mouth.   glycopyrrolate (ROBINUL) 1 MG tablet Take 1 tablet (1 mg total) by mouth 3 (three) times daily as needed (Oropharyngeal secretions).   LORazepam (ATIVAN) 0.5 MG tablet Take 1 tablet (0.5 mg total) by mouth every 4 (four) hours as needed for up to 20 doses for anxiety.   Mouthwashes (MOUTH RINSE) LIQD solution 15 mLs by Mouth Rinse route as needed (oral care).   ondansetron (ZOFRAN-ODT) 4 MG disintegrating tablet Take 1 tablet (4 mg total) by mouth every 6 (six) hours as needed for nausea.   oxyCODONE (ROXICODONE) 5 MG immediate release tablet Take 1 tablet (5 mg total) by mouth every 4 (four) hours as needed for up to 7 days for moderate pain or severe pain.   polyvinyl alcohol (LIQUIFILM TEARS) 1.4 % ophthalmic solution Place 1 drop into both eyes 4 (four) times daily as needed for dry eyes.   No facility-administered encounter medications on file as of 09/02/2022.    History obtained from review of EMR, discussion with family, facility staff/caregiver and/or patient.   CBC    Component Value Date/Time   WBC 6.6 08/26/2022 1330   RBC 4.59 08/26/2022 1330   HGB 14.1 08/26/2022 1330   HCT 41.0 08/26/2022 1330    PLT 91 (L) 08/26/2022 1330   MCV 89.3 08/26/2022 1330   MCH 30.7 08/26/2022 1330   MCHC 34.4 08/26/2022 1330   RDW 13.0 08/26/2022 1330   LYMPHSABS 0.7 08/24/2022 0605   MONOABS 0.5 08/24/2022 0605   EOSABS 0.0 08/24/2022 0605   BASOSABS 0.0 08/24/2022 0605    CMP    Latest Ref Rng & Units 08/26/2022    1:30 PM 08/24/2022    4:51 AM  08/23/2022    7:58 AM  CMP  Glucose 70 - 99 mg/dL 161  096  045   BUN 8 - 23 mg/dL Creatinine 0.61 - 1.24 mg/dL 4.09  8.11  9.14   Sodium 135 - 145 mmol/L 134  132  132   Potassium 3.5 - 5.1 mmol/L 4.1  4.3  3.8   Chloride 98 - 111 mmol/L 101  101  98   CO2 22 - 32 mmol/L Calcium 8.9 - 10.3 mg/dL 8.2  8.4  8.6   Total Protein 6.5 - 8.1 g/dL 5.6  6.1  6.0   Total Bilirubin 0.3 - 1.2 mg/dL 0.8  0.8  0.5   Alkaline Phos 38 - 126 U/L 43  46  46   AST 15 - 41 U/L ALT 0 - 44 U/L LFTs    Latest Ref Rng & Units 08/26/2022    1:30 PM 08/24/2022    4:51 AM 08/23/2022    7:58 AM  Hepatic Function  Total Protein 6.5 - 8.1 g/dL 5.6  6.1  6.0   Albumin 3.5 - 5.0 g/dL 2.4  2.7  2.6   AST 15 - 41 U/L ALT 0 - 44 U/L Alk Phosphatase 38 - 126 U/L 43  46  46   Total Bilirubin 0.3 - 1.2 mg/dL 0.8  0.8  0.5     Urinalysis    Component Value Date/Time   COLORURINE YELLOW 08/20/2022 1435   APPEARANCEUR CLEAR 08/20/2022 1435   LABSPEC 1.023 08/20/2022 1435   PHURINE 5.0 08/20/2022 1435   GLUCOSEU NEGATIVE 08/20/2022 1435   GLUCOSEU NEGATIVE 02/11/2022 0946   HGBUR NEGATIVE 08/20/2022 1435   BILIRUBINUR NEGATIVE 08/20/2022 1435   KETONESUR NEGATIVE 08/20/2022 1435   PROTEINUR NEGATIVE 08/20/2022 1435   UROBILINOGEN 2.0 (A) 02/11/2022 0946   NITRITE NEGATIVE 08/20/2022 1435   LEUKOCYTESUR NEGATIVE 08/20/2022 1435       I reviewed available labs, medications, imaging, studies and related documents from the EMR.  Records reviewed and summarized above.   Physical  Exam: GENERAL: NAD LUNGS: Coarse upper airway/wet vocal quality, no increased work of breathing, room air CARDIAC:  S1S2, RRR with no MRG, No edema/cyanosis ABD:  Normo-active BS x 4 quads, soft, non-tender EXTREMITIES: Some flexion contracture of right hand.  Some muscle loss in BLE but appears to be atrophy from non-weightbearing. No muscle atrophy/subcutaneous fat loss in trunk, face and upper extremities NEURO:  Right sided weakness, unable to fully assess cognitive status due to aphasia- both expressive and receptive as well as dysarthria PSYCH:  non-anxious affect, A & Oriented but difficult to assess degree of orientation  Thank you for the opportunity to participate in the care of Marybelle Killings. Please call our main office at [phone] if we can be of additional assistance.    Joycelyn Man FNP-C  [email or web address] .[facility]  Phone:  [phone]

## 2022-09-08 DIAGNOSIS — L8990 Pressure ulcer of unspecified site, unspecified stage: Secondary | ICD-10-CM | POA: Diagnosis not present

## 2022-09-08 DIAGNOSIS — G9340 Encephalopathy, unspecified: Secondary | ICD-10-CM | POA: Diagnosis not present

## 2022-09-08 DIAGNOSIS — C439 Malignant melanoma of skin, unspecified: Secondary | ICD-10-CM | POA: Diagnosis not present

## 2022-09-08 DIAGNOSIS — L89156 Pressure-induced deep tissue damage of sacral region: Secondary | ICD-10-CM | POA: Diagnosis not present

## 2022-09-09 ENCOUNTER — Other Ambulatory Visit: Payer: Self-pay | Admitting: *Deleted

## 2022-09-09 NOTE — Patient Outreach (Signed)
David [name] resides in [facility].  Screening for potential [facility] care coordination services as benefit of health plan and primary care provider.   Facility site visit to [facility]. Spoke with daughter David [name] at bedside. David [name] reports the goal is for [name] to return home with her post SNF. Discussed [facility] care coordination services. Provided [facility] Care Management brochure and writer's contact information.   Will continue to follow.   [name], MSN, RN,BSN [facility] Post Acute Care Coordinator [phone] (Direct dial)

## 2022-09-14 ENCOUNTER — Non-Acute Institutional Stay: Payer: Medicare Other | Admitting: Family Medicine

## 2022-09-14 VITALS — BP 102/58 | HR 91 | Temp 97.8°F | Resp 18

## 2022-09-14 DIAGNOSIS — K5900 Constipation, unspecified: Secondary | ICD-10-CM

## 2022-09-14 DIAGNOSIS — I69351 Hemiplegia and hemiparesis following cerebral infarction affecting right dominant side: Secondary | ICD-10-CM

## 2022-09-14 DIAGNOSIS — R4701 Aphasia: Secondary | ICD-10-CM

## 2022-09-14 DIAGNOSIS — I69391 Dysphagia following cerebral infarction: Secondary | ICD-10-CM

## 2022-09-15 ENCOUNTER — Encounter: Payer: Self-pay | Admitting: Family Medicine

## 2022-09-15 DIAGNOSIS — L8990 Pressure ulcer of unspecified site, unspecified stage: Secondary | ICD-10-CM | POA: Diagnosis not present

## 2022-09-15 DIAGNOSIS — G9340 Encephalopathy, unspecified: Secondary | ICD-10-CM | POA: Diagnosis not present

## 2022-09-15 DIAGNOSIS — C439 Malignant melanoma of skin, unspecified: Secondary | ICD-10-CM | POA: Diagnosis not present

## 2022-09-15 DIAGNOSIS — L89156 Pressure-induced deep tissue damage of sacral region: Secondary | ICD-10-CM | POA: Diagnosis not present

## 2022-09-15 NOTE — Progress Notes (Signed)
Therapist, nutritional Palliative Care Consult Note Telephone: [phone]  Fax: [phone]   Date of encounter: 09/14/22 4:00 PM PATIENT NAME: David [name] [name] [address] [place] [address]   [phone] (home)  DOB: Sep 09, 1923 MRN: [id number] PRIMARY CARE PROVIDER:    Eloisa Northern, MD,  [address] [place] [address] [phone]  REFERRING PROVIDER:   Eloisa Northern, MD [address] [place],  [place] [address] [phone]  Emergency Contact:    Contact Information     Name Relation Home Work David [name] Daughter [phone]  [phone]   David [name] Daughter   [phone]   David [name] [phone]  [phone]       Health Care Agent-daughter David [name]   I met face to face with patient in [facility] along with his daughter, David [name] and son, David [name]. Palliative Care was asked to follow this patient by consultation request of David Northern, MD to address advance care planning and complex medical decision making. This is a follow up visit.  ADVANCE CARE PLANNING/GOALS OF CARE:  Review of an advance directive document-MOST and Living Will. Living will designates that David [name] POA can authorize withholding or withdrawal of life sustaining procedures when and if pt is terminally ill, persistently in a coma, suffer severe dementia or is in a persistent vegetative state.  Living will also expresses patient wish not to have life prolonged with life sustaining procedures in these situations.  It also states that he does want to receive artificial hydration and nutrition. Form is signed, dated 03/12/10 and has been notarized.  CODE STATUS: MOST as of 08/28/22 DNR/DNI with comfort measures No  antibiotics, IV fluids or feeding tube.    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 Reinforced need for participation with PT/OT to maximize independent function. Encourage OOB for am care/meal Give scheduled Acetaminophen 650 mg TID to help decrease pain/improve cooperation with therapy.   Dysphagia Continues to be mildly symptomatic with fluids Continue upright for all intake Small sips, use of chin tuck Continue ST   Global Aphasia Noted improvement in understandable speech Continue ST  4.     Constipation Discussed with PT getting pt onto [facility] in late afternoon which was normal home toileting schedule/possible OT in am. Agree with Sennoside 1 po BID and Miralax 17 gm daily to promote bowel evacuation    Follow up Palliative Care Visit:  Palliative Care continuing to follow up by monitoring for changes in appetite, weight, functional and cognitive status for chronic disease progression and management in agreement with patient's stated goals of care. Next visit in 3-4 weeks or prn.  This visit was coded based on medical decision making (MDM).  Chief Complaint  Pt's daughter requests visit, states pt is "giving up" and having trouble with constipation.  HISTORY OF PRESENT ILLNESS: David [name]  is a 89 y.o. 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 Prior to admission to the [name] with covid 19, pt was able to stand/pivot and ambulate with max assistance for short distances.  Per daughter, pt has made inconsistent progress per therapy.  She states he has had pain which he says is "all over". At ties She states he c/o significant abd pain and having  to strain with minimal bowel evacuation.  At home she had him on a schedule for bowel evacuation, along with 2 sennosides in am and Miralax 17 gm po daily.  Pt was 2 person assisted to bedside commode and was able to have a small formed BM.   ACTIVITIES OF DAILY LIVING: CONTINENT OF BLADDER/ BOWEL? Improving with toileting  schedule BATHING/DRESSING/FEEDING: Assistance with all except feeding  MOBILITY:   WHEELCHAIR or BEDBOUND, not able to stand/pivot presently  APPETITE? good WEIGHT: 179 lbs 0.2 oz as of 08/20/22, weight 02/18/22 was 188 lbs  CURRENT PROBLEM LIST:  Patient Active Problem List   Diagnosis Date Noted   Hypophosphatemia 09/02/2022   Goals of care, counseling/discussion 08/28/2022   End of life care 08/27/2022   Pressure injury of skin 08/27/2022   COVID-19 virus infection 08/20/2022   Weakness 08/20/2022   Intertrigo 02/10/2022   Aortic valve disorder 08/17/2021   Chronic ischemic heart disease 08/17/2021   Foot drop, right foot 08/17/2021   Hemiplegia and hemiparesis following cerebral infarction affecting right dominant side (HCC) 08/17/2021   Nail dystrophy 08/17/2021   Need for assistance with personal care 08/17/2021   Other specified problems related to psychosocial circumstances 08/17/2021   Pressure ulcer of unspecified buttock, unspecified stage 08/17/2021   Atherosclerosis of aorta (HCC) 09/28/2020   Adhesive capsulitis of right shoulder 08/19/2020   Situational anxiety 06/29/2020   Hyperkeratosis 01/29/2020   Polycythemia 01/03/2020   Elevated LFTs 01/03/2020   Spastic hemiparesis of right dominant side (HCC) 10/30/2019   Mass of heart valve 10/17/2019   Depression, major, single episode, moderate (HCC) 10/17/2019   Cognitive and neurobehavioral dysfunction    Labile blood pressure    Dysphagia, post-stroke    AKI (acute kidney injury) (HCC)    Hyperglycemia    Coronary artery disease involving native coronary artery of native heart without angina pectoris    Chronic obstructive pulmonary disease (HCC)    Tremors of nervous system    S/P AVR    Thrombocytopenia (HCC)    Global aphasia    ICH (intracerebral hemorrhage) (HCC) 09/04/2019   Tremor 05/20/2019   Pulmonary valve vegetation 09/20/2018   Onychomycosis 06/06/2018   Acute pain of left knee 08/18/2017    Skin irritation from shaving 12/02/2015   Benign paroxysmal positional vertigo 03/09/2015   Plantar fasciitis, bilateral 12/02/2014   Constipation 07/17/2014   S/P AVR (aortic valve replacement) 11/04/2013   Elevated hemoglobin (HCC) 12/14/2012   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   Edema 10/22/2010   Actinic keratoses 10/22/2010   THROMBOCYTOPENIA 01/22/2010   MELANOMA 03/05/2009   TOBACCO USE, QUIT 02/27/2009   Neoplasm of uncertain behavior of skin 05/08/2007   Dyslipidemia 02/08/2007   Essential hypertension 02/08/2007   Coronary atherosclerosis 02/08/2007   COPD (chronic obstructive pulmonary disease) (HCC) 02/08/2007   GERD 02/08/2007   Osteoarthritis 02/08/2007   LOW BACK PAIN 02/08/2007   Rash 01/29/2007   PAST MEDICAL HISTORY:  Active Ambulatory Problems    Diagnosis Date Noted   MELANOMA 03/05/2009   Neoplasm of uncertain behavior of skin 05/08/2007   Dyslipidemia 02/08/2007   THROMBOCYTOPENIA 01/22/2010   Essential hypertension 02/08/2007   Coronary atherosclerosis 02/08/2007   COPD (chronic obstructive pulmonary disease) (HCC) 02/08/2007   GERD 02/08/2007   Osteoarthritis 02/08/2007   LOW BACK PAIN 02/08/2007   Rash 01/29/2007   TOBACCO USE, QUIT 02/27/2009   Edema 10/22/2010   Actinic keratoses 10/22/2010   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   Elevated hemoglobin (HCC) 12/14/2012  S/P AVR (aortic valve replacement) 11/04/2013   Constipation 07/17/2014   Plantar fasciitis, bilateral 12/02/2014   Benign paroxysmal positional vertigo 03/09/2015   Skin irritation from shaving 12/02/2015   Acute pain of left knee 08/18/2017   Onychomycosis 06/06/2018   Pulmonary valve vegetation 09/20/2018   Tremor 05/20/2019   ICH (intracerebral hemorrhage) (HCC) 09/04/2019   Coronary artery disease involving native coronary artery of native heart without angina pectoris    Chronic obstructive pulmonary disease (HCC)    Tremors of nervous system    S/P AVR     Thrombocytopenia (HCC)    Global aphasia    Dysphagia, post-stroke    AKI (acute kidney injury) (HCC)    Hyperglycemia    Labile blood pressure    Cognitive and neurobehavioral dysfunction    Mass of heart valve 10/17/2019   Depression, major, single episode, moderate (HCC) 10/17/2019   Spastic hemiparesis of right dominant side (HCC) 10/30/2019   Polycythemia 01/03/2020   Elevated LFTs 01/03/2020   Hyperkeratosis 01/29/2020   Situational anxiety 06/29/2020   Adhesive capsulitis of right shoulder 08/19/2020   Atherosclerosis of aorta (HCC) 09/28/2020   Aortic valve disorder 08/17/2021   Chronic ischemic heart disease 08/17/2021   Foot drop, right foot 08/17/2021   Hemiplegia and hemiparesis following cerebral infarction affecting right dominant side (HCC) 08/17/2021   Nail dystrophy 08/17/2021   Need for assistance with personal care 08/17/2021   Other specified problems related to psychosocial circumstances 08/17/2021   Pressure ulcer of unspecified buttock, unspecified stage 08/17/2021   Intertrigo 02/10/2022   COVID-19 virus infection 08/20/2022   Weakness 08/20/2022   End of life care 08/27/2022   Pressure injury of skin 08/27/2022   Goals of care, counseling/discussion 08/28/2022   Hypophosphatemia 09/02/2022   Resolved Ambulatory Problems    Diagnosis Date Noted   Actinic keratosis 04/03/2007   Cough 01/29/2007   ABNORMAL GLUCOSE NEC 02/08/2007   Impaired glucose tolerance 02/14/2011   Well adult exam 11/12/2012   Cough 04/09/2014   Acute upper respiratory infection 05/12/2016   Wart of hand 06/05/2017   Chest pain, atypical 09/20/2018   Dysarthria as late effect of cerebrovascular disease 10/17/2019   Impacted cerumen, right ear 08/17/2021   Acute encephalopathy 08/20/2022   Past Medical History:  Diagnosis Date   CAD (coronary artery disease)    Current use of long term anticoagulation    GERD (gastroesophageal reflux disease)    Hyperlipidemia     Hypertension    LBP (low back pain)    OA (osteoarthritis)    SOCIAL HX:  Social History   Tobacco Use   Smoking status: Former    Types: Pipe    Quit date: 05/16/1998    Years since quitting: 24.3    Passive exposure: Past   Smokeless tobacco: Former  Substance Use Topics   Alcohol use: No   FAMILY HX:  Family History  Problem Relation Age of Onset   Coronary artery disease Other    Hypertension Other       Preferred Pharmacy: ALLERGIES:  Allergies  Allergen Reactions   Atorvastatin Other (See Comments)    Unknown reaction   Colesevelam Other (See Comments)    Unknown reaction   Simvastatin Other (See Comments)    Unknown reaction   Sulfa Antibiotics Other (See Comments)    Unknown reaction     PERTINENT MEDICATIONS:  Outpatient Encounter Medications as of 09/14/2022  Medication Sig   acetaminophen (TYLENOL) 325 MG tablet Take 2 tablets (  650 mg total) by mouth every 6 (six) hours as needed for mild pain (or Fever >/= 101). (Patient taking differently: Take 650 mg by mouth 3 (three) times daily.)   antiseptic oral rinse (BIOTENE) LIQD Apply 15 mLs topically as needed for dry mouth.   glycopyrrolate (ROBINUL) 1 MG tablet Take 1 tablet (1 mg total) by mouth 3 (three) times daily as needed (Oropharyngeal secretions).   LORazepam (ATIVAN) 0.5 MG tablet Take 1 tablet (0.5 mg total) by mouth every 4 (four) hours as needed for up to 20 doses for anxiety.   Mouthwashes (MOUTH RINSE) LIQD solution 15 mLs by Mouth Rinse route as needed (oral care).   ondansetron (ZOFRAN-ODT) 4 MG disintegrating tablet Take 1 tablet (4 mg total) by mouth every 6 (six) hours as needed for nausea.   polyethylene glycol (MIRALAX / GLYCOLAX) 17 g packet Take 17 g by mouth daily.   polyvinyl alcohol (LIQUIFILM TEARS) 1.4 % ophthalmic solution Place 1 drop into both eyes 4 (four) times daily as needed for dry eyes.   senna-docusate (SENOKOT-S) 8.6-50 MG tablet Take 1 tablet by mouth 2 (two) times daily.    No facility-administered encounter medications on file as of 09/14/2022.    History obtained from review of EMR, discussion with family, facility staff/caregiver and/or patient.       I reviewed available labs, medications, imaging, studies and related documents from the EMR.  There were no new records available.  Physical Exam: GENERAL: NAD LUNGS: CTAB, no increased work of breathing, room air CARDIAC:  S1S2, RRR with no MRG, No edema/cyanosis ABD:  Normo-active BS x 4 quads, soft, non-tender EXTREMITIES: Some flexion contracture of right hand.  Some muscle loss in BLE but appears to be atrophy from non-weightbearing. Able to partially assist with weightbearing in transfer NEURO:  Right sided weakness, improved dysarthria PSYCH:  non-anxious affect, A & O x 3  Thank you for the opportunity to participate in the care of David [name]. Please call our main office at [phone] if we can be of additional assistance.    Joycelyn Man FNP-C  [email or web address] .[facility]  Phone:  [phone]

## 2022-09-20 ENCOUNTER — Other Ambulatory Visit: Payer: Self-pay | Admitting: *Deleted

## 2022-09-20 NOTE — Patient Outreach (Signed)
Per [facility] Mr. Troupe resides in [place] skilled nursing facility. Screening for potential [facility] care coordination services as benefit of health plan and Primary Care Provider.   Secure communication sent to Aviva Kluver for collaboration about transition plans and potential [facility] care coordination needs.   Will continue to follow.   [name], MSN, RN,BSN [facility] Care Coordinator [phone] (Direct dial)

## 2022-09-22 DIAGNOSIS — I10 Essential (primary) hypertension: Secondary | ICD-10-CM | POA: Diagnosis not present

## 2022-09-22 DIAGNOSIS — L8990 Pressure ulcer of unspecified site, unspecified stage: Secondary | ICD-10-CM | POA: Diagnosis not present

## 2022-09-22 DIAGNOSIS — G9340 Encephalopathy, unspecified: Secondary | ICD-10-CM | POA: Diagnosis not present

## 2022-09-22 DIAGNOSIS — C439 Malignant melanoma of skin, unspecified: Secondary | ICD-10-CM | POA: Diagnosis not present

## 2022-09-26 ENCOUNTER — Encounter: Payer: Self-pay | Admitting: Family Medicine

## 2022-09-26 ENCOUNTER — Non-Acute Institutional Stay: Payer: Medicare Other | Admitting: Family Medicine

## 2022-09-26 VITALS — BP 122/60 | HR 78 | Temp 98.0°F | Resp 16

## 2022-09-26 DIAGNOSIS — E441 Mild protein-calorie malnutrition: Secondary | ICD-10-CM | POA: Insufficient documentation

## 2022-09-26 DIAGNOSIS — K5900 Constipation, unspecified: Secondary | ICD-10-CM

## 2022-09-26 DIAGNOSIS — E440 Moderate protein-calorie malnutrition: Secondary | ICD-10-CM | POA: Insufficient documentation

## 2022-09-26 DIAGNOSIS — L89312 Pressure ulcer of right buttock, stage 2: Secondary | ICD-10-CM

## 2022-09-26 NOTE — Progress Notes (Signed)
Therapist, nutritional Palliative Care Consult Note Telephone: [phone]  Fax: [phone]   Date of encounter: 09/14/22 4:00 PM PATIENT NAME: David [name] [facility] [address] [place] [address]   [phone] (home)  DOB: 15-Nov-1923 MRN: [id number] PRIMARY CARE PROVIDER:    Eloisa Northern, MD,  [address] [place] [address] [phone]  REFERRING PROVIDER:   Eloisa Northern, MD [address] [place],  [place] [address] [phone]  Emergency Contact:    Contact Information     Name Relation Home Work [place] Daughter [phone]  [phone]   [name] Daughter   [phone]   Kyah, Rosser [phone]  [phone]       Health Care Agent-daughter [name]   I met face to face with patient in [facility] facility along with his daughter, [name] and son, Zacchaeus Akers. Palliative Care was asked to follow this patient by consultation request of Eloisa Northern, MD to address advance care planning and complex medical decision making. This is a follow up visit.  ADVANCE CARE PLANNING/GOALS OF CARE:  Review of an advance directive document-MOST and Living Will. Living will designates that [facility] POA can authorize withholding or withdrawal of life sustaining procedures when and if pt is terminally ill, persistently in a coma, suffer severe dementia or is in a persistent vegetative state.  Living will also expresses patient wish not to have life prolonged with life sustaining procedures in these situations.  It also states that he does want to receive artificial hydration and nutrition. Form is signed, dated 03/12/10 and has been notarized.  CODE STATUS: MOST as of 08/28/22 DNR/DNI with comfort measures No  antibiotics, IV fluids or feeding tube.    ASSESSMENT AND / RECOMMENDATIONS:  PPS:  30% with potential for rehab improvement  Pressure ulcer of right buttock, stage 2 Recommend low air loss  pressure mattress overlay for bed, pressure reduction cushion for chair/wheelchair. Offloading buttocks by not sitting for more than 2 hours at a time.  Change positions. Recommend using small area of vaseline gauze to open area of buttock and cover with mepilex dressing.  Change Q 48 hours or when soiled, use adhesive remover to loosen dressing for removal. When patient is on his side, recommend leaving incontinence brief open to air. Agree with wound care consult.   Constipation Improving, would recommend increasing Sennosides to 2 tabs BID.  3.    Mild protein calorie malnutrition Recommend repeat CMP to check electrolytes and Albumin. If Albumin remains less than 3 would recommend increasing intake of Prostat to BID to TID for wound healing.      Follow up Palliative Care Visit:  Palliative Care continuing to follow up by monitoring for changes in appetite, weight, functional and cognitive status for chronic disease progression and management in agreement with patient's stated goals of care. Next visit in 3-4 weeks or prn.  This visit was coded based on medical decision making (MDM).  Chief Complaint  Acute visit requested by daughter for worsening skin breakdown over buttocks despite treatment.  HISTORY OF PRESENT ILLNESS: David [name]  is a 89 y.o. 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 Pt has been able to have more regular bowel movements.  Staff has been repositioning patient and using barrier protective cream on buttocks but he continues to have an area that has broken down but is not improving.  Last albumin during hospitalization was 2.7 with low phosphorous.  He has been receiving Prostat 30 ml daily.  Wound Care consult has been requested for tomorrow.  States he is sleeping well.  Daughter states he is eating fair, eating some of everything on his plate at each meal.   ACTIVITIES OF DAILY LIVING: CONTINENT OF BLADDER/ BOWEL?  Improving with toileting schedule BATHING/DRESSING/FEEDING: Assistance with all except feeding  MOBILITY:   WHEELCHAIR or BEDBOUND, not able to stand/pivot presently  APPETITE? good WEIGHT: 179 lbs 0.2 oz as of 08/20/22, weight 02/18/22 was 188 lbs  CURRENT PROBLEM LIST:  Patient Active Problem List   Diagnosis Date Noted   Hypophosphatemia 09/02/2022   Goals of care, counseling/discussion 08/28/2022   End of life care 08/27/2022   Pressure injury of skin 08/27/2022   COVID-19 virus infection 08/20/2022   Weakness 08/20/2022   Intertrigo 02/10/2022   Aortic valve disorder 08/17/2021   Chronic ischemic heart disease 08/17/2021   Foot drop, right foot 08/17/2021   Hemiplegia and hemiparesis following cerebral infarction affecting right dominant side (HCC) 08/17/2021   Nail dystrophy 08/17/2021   Need for assistance with personal care 08/17/2021   Other specified problems related to psychosocial circumstances 08/17/2021   Pressure ulcer of unspecified buttock, unspecified stage 08/17/2021   Atherosclerosis of aorta (HCC) 09/28/2020   Adhesive capsulitis of right shoulder 08/19/2020   Situational anxiety 06/29/2020   Hyperkeratosis 01/29/2020   Polycythemia 01/03/2020   Elevated LFTs 01/03/2020   Spastic hemiparesis of right dominant side (HCC) 10/30/2019   Mass of heart valve 10/17/2019   Depression, major, single episode, moderate (HCC) 10/17/2019   Cognitive and neurobehavioral dysfunction    Labile blood pressure    Dysphagia, post-stroke    AKI (acute kidney injury) (HCC)    Hyperglycemia    Coronary artery disease involving native coronary artery of native heart without angina pectoris    Chronic obstructive pulmonary disease (HCC)    Tremors of nervous system    S/P AVR    Thrombocytopenia (HCC)    Global aphasia    ICH (intracerebral hemorrhage) (HCC) 09/04/2019   Tremor 05/20/2019   Pulmonary valve vegetation 09/20/2018   Onychomycosis 06/06/2018   Acute pain of  left knee 08/18/2017   Skin irritation from shaving 12/02/2015   Benign paroxysmal positional vertigo 03/09/2015   Plantar fasciitis, bilateral 12/02/2014   Constipation 07/17/2014   S/P AVR (aortic valve replacement) 11/04/2013   Elevated hemoglobin (HCC) 12/14/2012   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   Edema 10/22/2010   Actinic keratoses 10/22/2010   THROMBOCYTOPENIA 01/22/2010   MELANOMA 03/05/2009   TOBACCO USE, QUIT 02/27/2009   Neoplasm of uncertain behavior of skin 05/08/2007   Dyslipidemia 02/08/2007   Essential hypertension 02/08/2007   Coronary atherosclerosis 02/08/2007   COPD (chronic obstructive pulmonary disease) (HCC) 02/08/2007   GERD 02/08/2007   Osteoarthritis 02/08/2007   LOW BACK PAIN 02/08/2007   Rash 01/29/2007   PAST MEDICAL HISTORY:  Active Ambulatory Problems    Diagnosis Date Noted   MELANOMA 03/05/2009   Neoplasm of uncertain behavior of skin 05/08/2007   Dyslipidemia 02/08/2007   THROMBOCYTOPENIA 01/22/2010   Essential hypertension 02/08/2007   Coronary atherosclerosis 02/08/2007   COPD (chronic obstructive pulmonary disease) (HCC) 02/08/2007   GERD 02/08/2007   Osteoarthritis 02/08/2007   LOW BACK PAIN 02/08/2007   Rash 01/29/2007   TOBACCO USE, QUIT 02/27/2009   Edema 10/22/2010   Actinic keratoses 10/22/2010   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   Elevated hemoglobin (  HCC) 12/14/2012   S/P AVR (aortic valve replacement) 11/04/2013   Constipation 07/17/2014   Plantar fasciitis, bilateral 12/02/2014   Benign paroxysmal positional vertigo 03/09/2015   Skin irritation from shaving 12/02/2015   Acute pain of left knee 08/18/2017   Onychomycosis 06/06/2018   Pulmonary valve vegetation 09/20/2018   Tremor 05/20/2019   ICH (intracerebral hemorrhage) (HCC) 09/04/2019   Coronary artery disease involving native coronary artery of native heart without angina pectoris    Chronic obstructive pulmonary disease (HCC)    Tremors of nervous system     S/P AVR    Thrombocytopenia (HCC)    Global aphasia    Dysphagia, post-stroke    AKI (acute kidney injury) (HCC)    Hyperglycemia    Labile blood pressure    Cognitive and neurobehavioral dysfunction    Mass of heart valve 10/17/2019   Depression, major, single episode, moderate (HCC) 10/17/2019   Spastic hemiparesis of right dominant side (HCC) 10/30/2019   Polycythemia 01/03/2020   Elevated LFTs 01/03/2020   Hyperkeratosis 01/29/2020   Situational anxiety 06/29/2020   Adhesive capsulitis of right shoulder 08/19/2020   Atherosclerosis of aorta (HCC) 09/28/2020   Aortic valve disorder 08/17/2021   Chronic ischemic heart disease 08/17/2021   Foot drop, right foot 08/17/2021   Hemiplegia and hemiparesis following cerebral infarction affecting right dominant side (HCC) 08/17/2021   Nail dystrophy 08/17/2021   Need for assistance with personal care 08/17/2021   Other specified problems related to psychosocial circumstances 08/17/2021   Pressure ulcer of unspecified buttock, unspecified stage 08/17/2021   Intertrigo 02/10/2022   COVID-19 virus infection 08/20/2022   Weakness 08/20/2022   End of life care 08/27/2022   Pressure injury of skin 08/27/2022   Goals of care, counseling/discussion 08/28/2022   Hypophosphatemia 09/02/2022   Resolved Ambulatory Problems    Diagnosis Date Noted   Actinic keratosis 04/03/2007   Cough 01/29/2007   ABNORMAL GLUCOSE NEC 02/08/2007   Impaired glucose tolerance 02/14/2011   Well adult exam 11/12/2012   Cough 04/09/2014   Acute upper respiratory infection 05/12/2016   Wart of hand 06/05/2017   Chest pain, atypical 09/20/2018   Dysarthria as late effect of cerebrovascular disease 10/17/2019   Impacted cerumen, right ear 08/17/2021   Acute encephalopathy 08/20/2022   Past Medical History:  Diagnosis Date   CAD (coronary artery disease)    Current use of long term anticoagulation    GERD (gastroesophageal reflux disease)     Hyperlipidemia    Hypertension    LBP (low back pain)    OA (osteoarthritis)    SOCIAL HX:  Social History   Tobacco Use   Smoking status: Former    Types: Pipe    Quit date: 05/16/1998    Years since quitting: 24.3    Passive exposure: Past   Smokeless tobacco: Former  Substance Use Topics   Alcohol use: No   FAMILY HX:  Family History  Problem Relation Age of Onset   Coronary artery disease Other    Hypertension Other       Preferred Pharmacy: ALLERGIES:  Allergies  Allergen Reactions   Atorvastatin Other (See Comments)    Unknown reaction   Colesevelam Other (See Comments)    Unknown reaction   Simvastatin Other (See Comments)    Unknown reaction   Sulfa Antibiotics Other (See Comments)    Unknown reaction     PERTINENT MEDICATIONS:  Outpatient Encounter Medications as of 09/26/2022  Medication Sig   acetaminophen (TYLENOL) 325 MG  tablet Take 2 tablets (650 mg total) by mouth every 6 (six) hours as needed for mild pain (or Fever >/= 101). (Patient taking differently: Take 650 mg by mouth 3 (three) times daily.)   Amino Acids-Protein Hydrolys (FEEDING SUPPLEMENT, PRO-STAT SUGAR FREE 64,) LIQD Take 30 mLs by mouth daily.   antiseptic oral rinse (BIOTENE) LIQD Apply 15 mLs topically as needed for dry mouth.   escitalopram (LEXAPRO) 10 MG tablet Take 10 mg by mouth daily.   glycopyrrolate (ROBINUL) 1 MG tablet Take 1 tablet (1 mg total) by mouth 3 (three) times daily as needed (Oropharyngeal secretions).   ondansetron (ZOFRAN-ODT) 4 MG disintegrating tablet Take 1 tablet (4 mg total) by mouth every 6 (six) hours as needed for nausea.   polyethylene glycol (MIRALAX / GLYCOLAX) 17 g packet Take 17 g by mouth daily.   polyvinyl alcohol (LIQUIFILM TEARS) 1.4 % ophthalmic solution Place 1 drop into both eyes 4 (four) times daily as needed for dry eyes.   senna-docusate (SENOKOT-S) 8.6-50 MG tablet Take 1 tablet by mouth 2 (two) times daily.   LORazepam (ATIVAN) 0.5 MG tablet  Take 1 tablet (0.5 mg total) by mouth every 4 (four) hours as needed for up to 20 doses for anxiety.   Mouthwashes (MOUTH RINSE) LIQD solution 15 mLs by Mouth Rinse route as needed (oral care).   No facility-administered encounter medications on file as of 09/26/2022.    History obtained from review of EMR, discussion with family, facility staff/caregiver and/or patient.       I reviewed available labs, medications, imaging, studies and related documents from the EMR.  There were no new records available.  Physical Exam: GENERAL: NAD LUNGS: CTAB, no increased work of breathing, room air CARDIAC:  S1S2, irregular with occasional premature beat with no MRG, No edema/cyanosis ABD:  Normo-active BS x 4 quads, soft, non-tender EXTREMITIES: Noted right foot drop SKIN:  Irregularly shaped lengthwise skin shear from right buttock next to gluteal cleft in an area of mild pink hyperpigmentation of skin involving 2-3 inches lateral of each gluteal cleft and extending down on perineum. Small soft brown stool at rectum cleaned. NEURO:  Right sided weakness, improved dysarthria PSYCH:  non-anxious affect, Asleep, arousable, oriented  Thank you for the opportunity to participate in the care of Marybelle Killings. Please call our main office at [phone] if we can be of additional assistance.    Joycelyn Man FNP-C  [email or web address] .[facility]  Phone:  [phone]

## 2022-09-29 DIAGNOSIS — I10 Essential (primary) hypertension: Secondary | ICD-10-CM | POA: Diagnosis not present

## 2022-09-29 DIAGNOSIS — G9340 Encephalopathy, unspecified: Secondary | ICD-10-CM | POA: Diagnosis not present

## 2022-09-29 DIAGNOSIS — L8990 Pressure ulcer of unspecified site, unspecified stage: Secondary | ICD-10-CM | POA: Diagnosis not present

## 2022-09-29 DIAGNOSIS — C439 Malignant melanoma of skin, unspecified: Secondary | ICD-10-CM | POA: Diagnosis not present

## 2022-10-05 DIAGNOSIS — I10 Essential (primary) hypertension: Secondary | ICD-10-CM | POA: Diagnosis not present

## 2022-10-05 DIAGNOSIS — G9340 Encephalopathy, unspecified: Secondary | ICD-10-CM | POA: Diagnosis not present

## 2022-10-05 DIAGNOSIS — C439 Malignant melanoma of skin, unspecified: Secondary | ICD-10-CM | POA: Diagnosis not present

## 2022-10-05 DIAGNOSIS — L8990 Pressure ulcer of unspecified site, unspecified stage: Secondary | ICD-10-CM | POA: Diagnosis not present

## 2022-10-12 DIAGNOSIS — L8990 Pressure ulcer of unspecified site, unspecified stage: Secondary | ICD-10-CM | POA: Diagnosis not present

## 2022-10-12 DIAGNOSIS — G9340 Encephalopathy, unspecified: Secondary | ICD-10-CM | POA: Diagnosis not present

## 2022-10-12 DIAGNOSIS — I10 Essential (primary) hypertension: Secondary | ICD-10-CM | POA: Diagnosis not present

## 2022-10-12 DIAGNOSIS — C439 Malignant melanoma of skin, unspecified: Secondary | ICD-10-CM | POA: Diagnosis not present

## 2022-10-13 ENCOUNTER — Non-Acute Institutional Stay: Payer: Medicare Other | Admitting: Family Medicine

## 2022-10-13 ENCOUNTER — Encounter: Payer: Self-pay | Admitting: Family Medicine

## 2022-10-13 VITALS — BP 110/70 | HR 91 | Temp 97.9°F | Resp 18

## 2022-10-13 DIAGNOSIS — I69351 Hemiplegia and hemiparesis following cerebral infarction affecting right dominant side: Secondary | ICD-10-CM

## 2022-10-13 DIAGNOSIS — R4701 Aphasia: Secondary | ICD-10-CM

## 2022-10-13 DIAGNOSIS — L89312 Pressure ulcer of right buttock, stage 2: Secondary | ICD-10-CM

## 2022-10-13 DIAGNOSIS — I69391 Dysphagia following cerebral infarction: Secondary | ICD-10-CM

## 2022-10-13 DIAGNOSIS — K5900 Constipation, unspecified: Secondary | ICD-10-CM

## 2022-10-13 DIAGNOSIS — E440 Moderate protein-calorie malnutrition: Secondary | ICD-10-CM

## 2022-10-13 NOTE — Progress Notes (Signed)
Therapist, nutritional Palliative Care Consult Note Telephone: [phone]  Fax: [phone]   Date of encounter: 09/14/22 4:00 PM PATIENT NAME: David [name] [facility] [address] [place] [address]   [phone] (home)  DOB: [birth year] MRN: [id number] PRIMARY CARE PROVIDER:    Tresa Garter, MD,  [address] [place] [address] [phone]  REFERRING PROVIDER:   Tresa Garter, MD [address] [place],  [place] [address] [phone]  Emergency Contact:    Contact Information     Name Relation Home Work David [name] Daughter [phone]  [phone]   David [name] Daughter   [phone]   David [name], David [name] [phone]  [phone]       Health Care Agent-daughter David [name]   I met face to face with patient in [facility] along with his daughter, David [name] and son, David [name]. Palliative Care was asked to follow this patient by consultation request of Plotnikov, Georgina Quint, MD to address advance care planning and complex medical decision making. This is a follow up visit.  ADVANCE CARE PLANNING/GOALS OF CARE:  Review of an advance directive document-MOST and Living Will. Living will designates that [name] can authorize withholding or withdrawal of life sustaining procedures when and if pt is terminally ill, persistently in a coma, suffer severe dementia or is in a persistent vegetative state.  Living will also expresses patient wish not to have life prolonged with life sustaining procedures in these situations.  It also states that he does want to receive artificial hydration and nutrition. Form is signed, dated 03/12/10 and has been notarized.  CODE STATUS: MOST as of 08/28/22 DNR/DNI with comfort measures No  antibiotics, IV fluids or feeding tube.    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  Bedbound  Dysphagia post stroke Continue dysphagia IV diet with thin liquids under direct observation when awake and sitting up. Keep patient up for 45-60 min after eating. Follow aspiration precautions  Global aphasia Encourage communication when pt most rested and allow increased time for communication and processing of message. Mirror desired behavior if trying to get him to do something.  Moderate protein calorie malnutrition Last Albumin 2.4 on 08/26/22 decreased from 3.8 on 06/14/22, has been receiving 30 ml Prostat daily. No follow up Albumin available. 15 lb weight loss since 08/30/22, 23 lbs since Sept 2023. Showing signs of muscle wasting in BLE, hands and face.  Constipation Encourage sennosides 2 tabs BID and Dulcolax 10 mg supp daily prn  Pressure ulcer of right buttock, stage 2 Recommend low air loss pressure mattress overlay for bed, pressure reduction cushion for chair/wheelchair. Current dimensions per wound care: 0.3 x 0.2 x 0.1 cm with scant serosanguinous drainage and epithelialization as of 10/11/22 Continue application of zinc, offloading and leave off brief.        Follow up Palliative Care Visit:  Palliative Care continuing to follow up by monitoring for changes in appetite, weight, functional and cognitive status for chronic disease progression and management in agreement with patient's stated goals of care. Requesting referral to [facility].  This visit was coded based on medical decision making (MDM).  Chief Complaint  Acute visit as pt is being d/c'd to home tomorrow and daughter is wanting pt evaluated for possible transition to hospice.  HISTORY  OF PRESENT ILLNESS: David [name]  is a 89 y.o. 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 Pt's daughter states he was recently weighed last week and his weight was 165  lbs, on admission to the facility 08/30/22 pt was 180 lbs.  Prior to admission he was living with daughter who has a paid caregiver and he was ambulatory in the home with a hemiwalker and standby assist.  He was able to feed himself, brush his teeth and shave himself.  Currently he c/o pain in his low abdomen and significant issues with constipation.  Currently PT reports he is a 2 person assist at minimum to stand and is usually put in a lift to go to the chair.  Daughter states he is sleeping more during the day and there have been times recently where she and her brother were unable to awaken him.  He is currently not eating as well and has some trouble managing textures with ST indicating he is on a dysphagia 4 diet.  Daughter reports he is clearing his throat a lot while eating and drinking.  His meds are given crushed in applesauce or pudding. He is observed trying to turn in the bed and can reach with his left arm to the rail/turn slightly but unable to roll independently.  On admission his mobility score per PT was 25.  DC mobility score 24 with pt being dependent in all mobility for bed, substantial/max assist with pt not bearing any weight for chair to chair, sit to lie, lie to sit.  OT scored pt a 17 for self care on admission and 17 at d/c with pt requiring partial/moderate assistance for meals/oral hygiene, dependent for toileting/bathing/dressing.  At d/c he is independent with eating after set up, otherwise dependent for all other ADLs. He has right sided hemiplegia.  ST reports he is a fall risk, aspiration risk due to dysphagia and has communication frustration. His daughter indicates his speech is more difficult to understand and that he continues to have trouble swallowing. Daughter states he is having some worsening of his aphasia and sometimes his speech is non-sensical.  She states he is not eating as much now as previously. He currently has a deep tissue injury to sacral area that is being  managed by wound care.  ACTIVITIES OF DAILY LIVING: CONTINENT OF BLADDER/ BOWEL? Improving with toileting schedule BATHING/DRESSING/FEEDING: Assistance with all except feeding  MOBILITY:   WHEELCHAIR or BEDBOUND, not able to stand/pivot presently  APPETITE? good WEIGHT: 180 lbs on 08/30/22, weight 02/18/22 was 188 lbs, current weight 165 lbs per daughter  CURRENT PROBLEM LIST:  Patient Active Problem List   Diagnosis Date Noted   Mild protein-calorie malnutrition (HCC) 09/26/2022   Hypophosphatemia 09/02/2022   Goals of care, counseling/discussion 08/28/2022   End of life care 08/27/2022   Pressure injury of skin 08/27/2022   COVID-19 virus infection 08/20/2022   Weakness 08/20/2022   Intertrigo 02/10/2022   Aortic valve disorder 08/17/2021   Chronic ischemic heart disease 08/17/2021   Foot drop, right foot 08/17/2021   Hemiplegia and hemiparesis following cerebral infarction affecting right dominant side (HCC) 08/17/2021   Nail dystrophy 08/17/2021   Need for assistance with personal care 08/17/2021   Other specified problems related to psychosocial circumstances 08/17/2021   Pressure ulcer of unspecified buttock, unspecified stage 08/17/2021   Atherosclerosis of aorta (HCC) 09/28/2020   Adhesive capsulitis of right shoulder 08/19/2020   Situational anxiety 06/29/2020  Hyperkeratosis 01/29/2020   Polycythemia 01/03/2020   Elevated LFTs 01/03/2020   Spastic hemiparesis of right dominant side (HCC) 10/30/2019   Mass of heart valve 10/17/2019   Depression, major, single episode, moderate (HCC) 10/17/2019   Cognitive and neurobehavioral dysfunction    Labile blood pressure    Dysphagia, post-stroke    AKI (acute kidney injury) (HCC)    Hyperglycemia    Coronary artery disease involving native coronary artery of native heart without angina pectoris    Chronic obstructive pulmonary disease (HCC)    Tremors of nervous system    S/P AVR    Thrombocytopenia (HCC)    Global  aphasia    ICH (intracerebral hemorrhage) (HCC) 09/04/2019   Tremor 05/20/2019   Pulmonary valve vegetation 09/20/2018   Onychomycosis 06/06/2018   Acute pain of left knee 08/18/2017   Skin irritation from shaving 12/02/2015   Benign paroxysmal positional vertigo 03/09/2015   Plantar fasciitis, bilateral 12/02/2014   Constipation 07/17/2014   S/P AVR (aortic valve replacement) 11/04/2013   Elevated hemoglobin (HCC) 12/14/2012   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   Edema 10/22/2010   Actinic keratoses 10/22/2010   THROMBOCYTOPENIA 01/22/2010   MELANOMA 03/05/2009   TOBACCO USE, QUIT 02/27/2009   Neoplasm of uncertain behavior of skin 05/08/2007   Dyslipidemia 02/08/2007   Essential hypertension 02/08/2007   Coronary atherosclerosis 02/08/2007   COPD (chronic obstructive pulmonary disease) (HCC) 02/08/2007   GERD 02/08/2007   Osteoarthritis 02/08/2007   LOW BACK PAIN 02/08/2007   Rash 01/29/2007   PAST MEDICAL HISTORY:  Active Ambulatory Problems    Diagnosis Date Noted   MELANOMA 03/05/2009   Neoplasm of uncertain behavior of skin 05/08/2007   Dyslipidemia 02/08/2007   THROMBOCYTOPENIA 01/22/2010   Essential hypertension 02/08/2007   Coronary atherosclerosis 02/08/2007   COPD (chronic obstructive pulmonary disease) (HCC) 02/08/2007   GERD 02/08/2007   Osteoarthritis 02/08/2007   LOW BACK PAIN 02/08/2007   Rash 01/29/2007   TOBACCO USE, QUIT 02/27/2009   Edema 10/22/2010   Actinic keratoses 10/22/2010   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   Elevated hemoglobin (HCC) 12/14/2012   S/P AVR (aortic valve replacement) 11/04/2013   Constipation 07/17/2014   Plantar fasciitis, bilateral 12/02/2014   Benign paroxysmal positional vertigo 03/09/2015   Skin irritation from shaving 12/02/2015   Acute pain of left knee 08/18/2017   Onychomycosis 06/06/2018   Pulmonary valve vegetation 09/20/2018   Tremor 05/20/2019   ICH (intracerebral hemorrhage) (HCC) 09/04/2019    Coronary artery disease involving native coronary artery of native heart without angina pectoris    Chronic obstructive pulmonary disease (HCC)    Tremors of nervous system    S/P AVR    Thrombocytopenia (HCC)    Global aphasia    Dysphagia, post-stroke    AKI (acute kidney injury) (HCC)    Hyperglycemia    Labile blood pressure    Cognitive and neurobehavioral dysfunction    Mass of heart valve 10/17/2019   Depression, major, single episode, moderate (HCC) 10/17/2019   Spastic hemiparesis of right dominant side (HCC) 10/30/2019   Polycythemia 01/03/2020   Elevated LFTs 01/03/2020   Hyperkeratosis 01/29/2020   Situational anxiety 06/29/2020   Adhesive capsulitis of right shoulder 08/19/2020   Atherosclerosis of aorta (HCC) 09/28/2020   Aortic valve disorder 08/17/2021   Chronic ischemic heart disease 08/17/2021   Foot drop, right foot 08/17/2021   Hemiplegia and hemiparesis following cerebral infarction affecting right dominant side (HCC) 08/17/2021   Nail dystrophy 08/17/2021   Need for  assistance with personal care 08/17/2021   Other specified problems related to psychosocial circumstances 08/17/2021   Pressure ulcer of unspecified buttock, unspecified stage 08/17/2021   Intertrigo 02/10/2022   COVID-19 virus infection 08/20/2022   Weakness 08/20/2022   End of life care 08/27/2022   Pressure injury of skin 08/27/2022   Goals of care, counseling/discussion 08/28/2022   Hypophosphatemia 09/02/2022   Mild protein-calorie malnutrition (HCC) 09/26/2022   Resolved Ambulatory Problems    Diagnosis Date Noted   Actinic keratosis 04/03/2007   Cough 01/29/2007   ABNORMAL GLUCOSE NEC 02/08/2007   Impaired glucose tolerance 02/14/2011   Well adult exam 11/12/2012   Cough 04/09/2014   Acute upper respiratory infection 05/12/2016   Wart of hand 06/05/2017   Chest pain, atypical 09/20/2018   Dysarthria as late effect of cerebrovascular disease 10/17/2019   Impacted cerumen, right  ear 08/17/2021   Acute encephalopathy 08/20/2022   Past Medical History:  Diagnosis Date   CAD (coronary artery disease)    Current use of long term anticoagulation    GERD (gastroesophageal reflux disease)    Hyperlipidemia    Hypertension    LBP (low back pain)    OA (osteoarthritis)    SOCIAL HX:  Social History   Tobacco Use   Smoking status: Former    Types: Pipe    Quit date: 05/16/1998    Years since quitting: 24.4    Passive exposure: Past   Smokeless tobacco: Former  Substance Use Topics   Alcohol use: No   FAMILY HX:  Family History  Problem Relation Age of Onset   Coronary artery disease Other    Hypertension Other       Preferred Pharmacy: ALLERGIES:  Allergies  Allergen Reactions   Atorvastatin Other (See Comments)    Unknown reaction   Colesevelam Other (See Comments)    Unknown reaction   Simvastatin Other (See Comments)    Unknown reaction   Sulfa Antibiotics Other (See Comments)    Unknown reaction     PERTINENT MEDICATIONS:  Outpatient Encounter Medications as of 10/13/2022  Medication Sig   acetaminophen (TYLENOL) 325 MG tablet Take 2 tablets (650 mg total) by mouth every 6 (six) hours as needed for mild pain (or Fever >/= 101). (Patient taking differently: Take 650 mg by mouth 3 (three) times daily.)   Amino Acids-Protein Hydrolys (FEEDING SUPPLEMENT, PRO-STAT SUGAR FREE 64,) LIQD Take 30 mLs by mouth daily.   antiseptic oral rinse (BIOTENE) LIQD Apply 15 mLs topically as needed for dry mouth.   escitalopram (LEXAPRO) 10 MG tablet Take 10 mg by mouth daily.   glycopyrrolate (ROBINUL) 1 MG tablet Take 1 tablet (1 mg total) by mouth 3 (three) times daily as needed (Oropharyngeal secretions).   Mouthwashes (MOUTH RINSE) LIQD solution 15 mLs by Mouth Rinse route as needed (oral care).   ondansetron (ZOFRAN-ODT) 4 MG disintegrating tablet Take 1 tablet (4 mg total) by mouth every 6 (six) hours as needed for nausea.   polyethylene glycol (MIRALAX /  GLYCOLAX) 17 g packet Take 17 g by mouth daily.   polyvinyl alcohol (LIQUIFILM TEARS) 1.4 % ophthalmic solution Place 1 drop into both eyes 4 (four) times daily as needed for dry eyes.   senna-docusate (SENOKOT-S) 8.6-50 MG tablet Take 1 tablet by mouth 2 (two) times daily.   [DISCONTINUED] LORazepam (ATIVAN) 0.5 MG tablet Take 1 tablet (0.5 mg total) by mouth every 4 (four) hours as needed for up to 20 doses for anxiety.   No  facility-administered encounter medications on file as of 10/13/2022.    History obtained from review of facility EMR, discussion with family and/or patient.       I reviewed available labs, medications, imaging, studies and related documents from the EMR.  There were no new records available.  Physical Exam: GENERAL: NAD.  Noted weight loss LUNGS: CTAB, no increased work of breathing, room air CARDIAC:  S1S2, irregular with occasional premature beat with no MRG, No edema/cyanosis ABD:  Normo-active BS x 4 quads, soft, non-tender EXTREMITIES: Noted right foot drop and muscle mass loss in BLE, noted loss of thenar fullness in bilateral hands  SKIN: Deferred (see wound care description) NEURO:  Right sided weakness, noted increased dysarthria with poorer articulation and understandability PSYCH:  non-anxious affect, Asleep, arousable, oriented only to self  Thank you for the opportunity to participate in the care of David [name]. Please call our main office at [phone] if we can be of additional assistance.    Joycelyn Man FNP-C  [email or web address] .[facility]  Phone:  [phone]

## 2022-10-14 ENCOUNTER — Telehealth: Payer: Self-pay | Admitting: Family Medicine

## 2022-10-14 NOTE — Telephone Encounter (Signed)
Sent message via Epic to Dr Hillard Danker, covering for pt's PCP Dr Rhea Belton Plotnikov regarding hospice referral.  She sent back response that she agrees with 6 month or less prognosis and PCP will serve as attending.  Joycelyn Man FNP-C

## 2022-10-17 DIAGNOSIS — I69320 Aphasia following cerebral infarction: Secondary | ICD-10-CM | POA: Diagnosis not present

## 2022-10-17 DIAGNOSIS — F0153 Vascular dementia, unspecified severity, with mood disturbance: Secondary | ICD-10-CM | POA: Diagnosis not present

## 2022-10-17 DIAGNOSIS — I10 Essential (primary) hypertension: Secondary | ICD-10-CM | POA: Diagnosis not present

## 2022-10-17 DIAGNOSIS — L89312 Pressure ulcer of right buttock, stage 2: Secondary | ICD-10-CM | POA: Diagnosis not present

## 2022-10-17 DIAGNOSIS — K219 Gastro-esophageal reflux disease without esophagitis: Secondary | ICD-10-CM | POA: Diagnosis not present

## 2022-10-17 DIAGNOSIS — I69318 Other symptoms and signs involving cognitive functions following cerebral infarction: Secondary | ICD-10-CM | POA: Diagnosis not present

## 2022-10-17 DIAGNOSIS — I2510 Atherosclerotic heart disease of native coronary artery without angina pectoris: Secondary | ICD-10-CM | POA: Diagnosis not present

## 2022-10-17 DIAGNOSIS — I69351 Hemiplegia and hemiparesis following cerebral infarction affecting right dominant side: Secondary | ICD-10-CM | POA: Diagnosis not present

## 2022-10-17 DIAGNOSIS — J449 Chronic obstructive pulmonary disease, unspecified: Secondary | ICD-10-CM | POA: Diagnosis not present

## 2022-10-17 DIAGNOSIS — I69391 Dysphagia following cerebral infarction: Secondary | ICD-10-CM | POA: Diagnosis not present

## 2022-10-17 DIAGNOSIS — Z6821 Body mass index (BMI) 21.0-21.9, adult: Secondary | ICD-10-CM | POA: Diagnosis not present

## 2022-10-17 DIAGNOSIS — Z8616 Personal history of COVID-19: Secondary | ICD-10-CM | POA: Diagnosis not present

## 2022-10-17 DIAGNOSIS — E785 Hyperlipidemia, unspecified: Secondary | ICD-10-CM | POA: Diagnosis not present

## 2022-10-18 DIAGNOSIS — I10 Essential (primary) hypertension: Secondary | ICD-10-CM | POA: Diagnosis not present

## 2022-10-18 DIAGNOSIS — I69351 Hemiplegia and hemiparesis following cerebral infarction affecting right dominant side: Secondary | ICD-10-CM | POA: Diagnosis not present

## 2022-10-18 DIAGNOSIS — I69391 Dysphagia following cerebral infarction: Secondary | ICD-10-CM | POA: Diagnosis not present

## 2022-10-18 DIAGNOSIS — I69320 Aphasia following cerebral infarction: Secondary | ICD-10-CM | POA: Diagnosis not present

## 2022-10-18 DIAGNOSIS — I69318 Other symptoms and signs involving cognitive functions following cerebral infarction: Secondary | ICD-10-CM | POA: Diagnosis not present

## 2022-10-18 DIAGNOSIS — F0153 Vascular dementia, unspecified severity, with mood disturbance: Secondary | ICD-10-CM | POA: Diagnosis not present

## 2022-10-19 DIAGNOSIS — I69318 Other symptoms and signs involving cognitive functions following cerebral infarction: Secondary | ICD-10-CM | POA: Diagnosis not present

## 2022-10-19 DIAGNOSIS — I10 Essential (primary) hypertension: Secondary | ICD-10-CM | POA: Diagnosis not present

## 2022-10-19 DIAGNOSIS — I69320 Aphasia following cerebral infarction: Secondary | ICD-10-CM | POA: Diagnosis not present

## 2022-10-19 DIAGNOSIS — I69351 Hemiplegia and hemiparesis following cerebral infarction affecting right dominant side: Secondary | ICD-10-CM | POA: Diagnosis not present

## 2022-10-19 DIAGNOSIS — F0153 Vascular dementia, unspecified severity, with mood disturbance: Secondary | ICD-10-CM | POA: Diagnosis not present

## 2022-10-19 DIAGNOSIS — I69391 Dysphagia following cerebral infarction: Secondary | ICD-10-CM | POA: Diagnosis not present

## 2022-10-21 DIAGNOSIS — I69391 Dysphagia following cerebral infarction: Secondary | ICD-10-CM | POA: Diagnosis not present

## 2022-10-21 DIAGNOSIS — I69351 Hemiplegia and hemiparesis following cerebral infarction affecting right dominant side: Secondary | ICD-10-CM | POA: Diagnosis not present

## 2022-10-21 DIAGNOSIS — F0153 Vascular dementia, unspecified severity, with mood disturbance: Secondary | ICD-10-CM | POA: Diagnosis not present

## 2022-10-21 DIAGNOSIS — I10 Essential (primary) hypertension: Secondary | ICD-10-CM | POA: Diagnosis not present

## 2022-10-21 DIAGNOSIS — I69318 Other symptoms and signs involving cognitive functions following cerebral infarction: Secondary | ICD-10-CM | POA: Diagnosis not present

## 2022-10-21 DIAGNOSIS — I69320 Aphasia following cerebral infarction: Secondary | ICD-10-CM | POA: Diagnosis not present

## 2022-10-24 ENCOUNTER — Ambulatory Visit: Payer: No Typology Code available for payment source | Admitting: Internal Medicine

## 2022-10-25 ENCOUNTER — Inpatient Hospital Stay: Payer: No Typology Code available for payment source | Admitting: Internal Medicine

## 2022-10-25 ENCOUNTER — Other Ambulatory Visit: Payer: Self-pay | Admitting: *Deleted

## 2022-10-25 DIAGNOSIS — F0153 Vascular dementia, unspecified severity, with mood disturbance: Secondary | ICD-10-CM | POA: Diagnosis not present

## 2022-10-25 DIAGNOSIS — I69320 Aphasia following cerebral infarction: Secondary | ICD-10-CM | POA: Diagnosis not present

## 2022-10-25 DIAGNOSIS — I10 Essential (primary) hypertension: Secondary | ICD-10-CM | POA: Diagnosis not present

## 2022-10-25 DIAGNOSIS — I69391 Dysphagia following cerebral infarction: Secondary | ICD-10-CM | POA: Diagnosis not present

## 2022-10-25 DIAGNOSIS — I69351 Hemiplegia and hemiparesis following cerebral infarction affecting right dominant side: Secondary | ICD-10-CM | POA: Diagnosis not present

## 2022-10-25 DIAGNOSIS — I69318 Other symptoms and signs involving cognitive functions following cerebral infarction: Secondary | ICD-10-CM | POA: Diagnosis not present

## 2022-10-25 NOTE — Patient Outreach (Signed)
[facility] Post-Acute Care Coordinator follow up. Screening for potential [facility] care coordination services as benefit of health plan and Primary Care Provider.   Confirmed with [employer] Child psychotherapist, Mr. Deruiter transitioned to home with [facility] on 10/14/22.   No identifiable [facility] care coordination needs.   [name], MSN, RN,BSN [facility] Post Acute Care Coordinator [phone] (Direct dial)

## 2022-10-26 DIAGNOSIS — I69318 Other symptoms and signs involving cognitive functions following cerebral infarction: Secondary | ICD-10-CM | POA: Diagnosis not present

## 2022-10-26 DIAGNOSIS — I69320 Aphasia following cerebral infarction: Secondary | ICD-10-CM | POA: Diagnosis not present

## 2022-10-26 DIAGNOSIS — I10 Essential (primary) hypertension: Secondary | ICD-10-CM | POA: Diagnosis not present

## 2022-10-26 DIAGNOSIS — I69391 Dysphagia following cerebral infarction: Secondary | ICD-10-CM | POA: Diagnosis not present

## 2022-10-26 DIAGNOSIS — F0153 Vascular dementia, unspecified severity, with mood disturbance: Secondary | ICD-10-CM | POA: Diagnosis not present

## 2022-10-26 DIAGNOSIS — I69351 Hemiplegia and hemiparesis following cerebral infarction affecting right dominant side: Secondary | ICD-10-CM | POA: Diagnosis not present

## 2022-10-27 DIAGNOSIS — F0153 Vascular dementia, unspecified severity, with mood disturbance: Secondary | ICD-10-CM | POA: Diagnosis not present

## 2022-10-27 DIAGNOSIS — I10 Essential (primary) hypertension: Secondary | ICD-10-CM | POA: Diagnosis not present

## 2022-10-27 DIAGNOSIS — I69391 Dysphagia following cerebral infarction: Secondary | ICD-10-CM | POA: Diagnosis not present

## 2022-10-27 DIAGNOSIS — I69320 Aphasia following cerebral infarction: Secondary | ICD-10-CM | POA: Diagnosis not present

## 2022-10-27 DIAGNOSIS — I69351 Hemiplegia and hemiparesis following cerebral infarction affecting right dominant side: Secondary | ICD-10-CM | POA: Diagnosis not present

## 2022-10-27 DIAGNOSIS — I69318 Other symptoms and signs involving cognitive functions following cerebral infarction: Secondary | ICD-10-CM | POA: Diagnosis not present

## 2022-11-01 DIAGNOSIS — I69318 Other symptoms and signs involving cognitive functions following cerebral infarction: Secondary | ICD-10-CM | POA: Diagnosis not present

## 2022-11-01 DIAGNOSIS — I69320 Aphasia following cerebral infarction: Secondary | ICD-10-CM | POA: Diagnosis not present

## 2022-11-01 DIAGNOSIS — F0153 Vascular dementia, unspecified severity, with mood disturbance: Secondary | ICD-10-CM | POA: Diagnosis not present

## 2022-11-01 DIAGNOSIS — I10 Essential (primary) hypertension: Secondary | ICD-10-CM | POA: Diagnosis not present

## 2022-11-01 DIAGNOSIS — I69391 Dysphagia following cerebral infarction: Secondary | ICD-10-CM | POA: Diagnosis not present

## 2022-11-01 DIAGNOSIS — I69351 Hemiplegia and hemiparesis following cerebral infarction affecting right dominant side: Secondary | ICD-10-CM | POA: Diagnosis not present

## 2022-11-07 DIAGNOSIS — I69391 Dysphagia following cerebral infarction: Secondary | ICD-10-CM | POA: Diagnosis not present

## 2022-11-07 DIAGNOSIS — I69318 Other symptoms and signs involving cognitive functions following cerebral infarction: Secondary | ICD-10-CM | POA: Diagnosis not present

## 2022-11-07 DIAGNOSIS — I10 Essential (primary) hypertension: Secondary | ICD-10-CM | POA: Diagnosis not present

## 2022-11-07 DIAGNOSIS — I69351 Hemiplegia and hemiparesis following cerebral infarction affecting right dominant side: Secondary | ICD-10-CM | POA: Diagnosis not present

## 2022-11-07 DIAGNOSIS — I69320 Aphasia following cerebral infarction: Secondary | ICD-10-CM | POA: Diagnosis not present

## 2022-11-07 DIAGNOSIS — F0153 Vascular dementia, unspecified severity, with mood disturbance: Secondary | ICD-10-CM | POA: Diagnosis not present

## 2022-11-14 DIAGNOSIS — E785 Hyperlipidemia, unspecified: Secondary | ICD-10-CM | POA: Diagnosis not present

## 2022-11-14 DIAGNOSIS — J449 Chronic obstructive pulmonary disease, unspecified: Secondary | ICD-10-CM | POA: Diagnosis not present

## 2022-11-14 DIAGNOSIS — F0153 Vascular dementia, unspecified severity, with mood disturbance: Secondary | ICD-10-CM | POA: Diagnosis not present

## 2022-11-14 DIAGNOSIS — K219 Gastro-esophageal reflux disease without esophagitis: Secondary | ICD-10-CM | POA: Diagnosis not present

## 2022-11-14 DIAGNOSIS — Z8616 Personal history of COVID-19: Secondary | ICD-10-CM | POA: Diagnosis not present

## 2022-11-14 DIAGNOSIS — L89312 Pressure ulcer of right buttock, stage 2: Secondary | ICD-10-CM | POA: Diagnosis not present

## 2022-11-14 DIAGNOSIS — Z6821 Body mass index (BMI) 21.0-21.9, adult: Secondary | ICD-10-CM | POA: Diagnosis not present

## 2022-11-14 DIAGNOSIS — I2510 Atherosclerotic heart disease of native coronary artery without angina pectoris: Secondary | ICD-10-CM | POA: Diagnosis not present

## 2022-11-14 DIAGNOSIS — I10 Essential (primary) hypertension: Secondary | ICD-10-CM | POA: Diagnosis not present

## 2022-11-14 DIAGNOSIS — I69318 Other symptoms and signs involving cognitive functions following cerebral infarction: Secondary | ICD-10-CM | POA: Diagnosis not present

## 2022-11-14 DIAGNOSIS — I69391 Dysphagia following cerebral infarction: Secondary | ICD-10-CM | POA: Diagnosis not present

## 2022-11-14 DIAGNOSIS — I69351 Hemiplegia and hemiparesis following cerebral infarction affecting right dominant side: Secondary | ICD-10-CM | POA: Diagnosis not present

## 2022-11-14 DIAGNOSIS — I69320 Aphasia following cerebral infarction: Secondary | ICD-10-CM | POA: Diagnosis not present

## 2022-11-21 DIAGNOSIS — I10 Essential (primary) hypertension: Secondary | ICD-10-CM | POA: Diagnosis not present

## 2022-11-21 DIAGNOSIS — I69320 Aphasia following cerebral infarction: Secondary | ICD-10-CM | POA: Diagnosis not present

## 2022-11-21 DIAGNOSIS — F0153 Vascular dementia, unspecified severity, with mood disturbance: Secondary | ICD-10-CM | POA: Diagnosis not present

## 2022-11-21 DIAGNOSIS — I69351 Hemiplegia and hemiparesis following cerebral infarction affecting right dominant side: Secondary | ICD-10-CM | POA: Diagnosis not present

## 2022-11-21 DIAGNOSIS — I69391 Dysphagia following cerebral infarction: Secondary | ICD-10-CM | POA: Diagnosis not present

## 2022-11-21 DIAGNOSIS — I69318 Other symptoms and signs involving cognitive functions following cerebral infarction: Secondary | ICD-10-CM | POA: Diagnosis not present

## 2022-11-25 ENCOUNTER — Telehealth: Payer: Self-pay | Admitting: Internal Medicine

## 2022-11-25 DIAGNOSIS — I10 Essential (primary) hypertension: Secondary | ICD-10-CM | POA: Diagnosis not present

## 2022-11-25 DIAGNOSIS — I69320 Aphasia following cerebral infarction: Secondary | ICD-10-CM | POA: Diagnosis not present

## 2022-11-25 DIAGNOSIS — I69391 Dysphagia following cerebral infarction: Secondary | ICD-10-CM | POA: Diagnosis not present

## 2022-11-25 DIAGNOSIS — I69318 Other symptoms and signs involving cognitive functions following cerebral infarction: Secondary | ICD-10-CM | POA: Diagnosis not present

## 2022-11-25 DIAGNOSIS — I69351 Hemiplegia and hemiparesis following cerebral infarction affecting right dominant side: Secondary | ICD-10-CM | POA: Diagnosis not present

## 2022-11-25 DIAGNOSIS — F0153 Vascular dementia, unspecified severity, with mood disturbance: Secondary | ICD-10-CM | POA: Diagnosis not present

## 2022-11-25 NOTE — Telephone Encounter (Signed)
Per patient's daughter - patient is not deceased - receiving hospice care and is at home.

## 2022-11-25 NOTE — Telephone Encounter (Signed)
OMG!! [facility] sent fax stating pt was deceased. Forwarding to MD - removed deceased.Marland KitchenRaechel Chute

## 2022-11-26 NOTE — Telephone Encounter (Signed)
I'm very happy to hear that David [name] is alive!  I am sorry about the confusion we had.  Please inform hospice about the fax with the wrong information that we received. Thank you

## 2022-11-28 DIAGNOSIS — F0153 Vascular dementia, unspecified severity, with mood disturbance: Secondary | ICD-10-CM | POA: Diagnosis not present

## 2022-11-28 DIAGNOSIS — I69318 Other symptoms and signs involving cognitive functions following cerebral infarction: Secondary | ICD-10-CM | POA: Diagnosis not present

## 2022-11-28 DIAGNOSIS — I69391 Dysphagia following cerebral infarction: Secondary | ICD-10-CM | POA: Diagnosis not present

## 2022-11-28 DIAGNOSIS — I10 Essential (primary) hypertension: Secondary | ICD-10-CM | POA: Diagnosis not present

## 2022-11-28 DIAGNOSIS — I69320 Aphasia following cerebral infarction: Secondary | ICD-10-CM | POA: Diagnosis not present

## 2022-11-28 DIAGNOSIS — I69351 Hemiplegia and hemiparesis following cerebral infarction affecting right dominant side: Secondary | ICD-10-CM | POA: Diagnosis not present

## 2022-12-05 DIAGNOSIS — I69391 Dysphagia following cerebral infarction: Secondary | ICD-10-CM | POA: Diagnosis not present

## 2022-12-05 DIAGNOSIS — I69351 Hemiplegia and hemiparesis following cerebral infarction affecting right dominant side: Secondary | ICD-10-CM | POA: Diagnosis not present

## 2022-12-05 DIAGNOSIS — I69320 Aphasia following cerebral infarction: Secondary | ICD-10-CM | POA: Diagnosis not present

## 2022-12-05 DIAGNOSIS — I69318 Other symptoms and signs involving cognitive functions following cerebral infarction: Secondary | ICD-10-CM | POA: Diagnosis not present

## 2022-12-05 DIAGNOSIS — F0153 Vascular dementia, unspecified severity, with mood disturbance: Secondary | ICD-10-CM | POA: Diagnosis not present

## 2022-12-05 DIAGNOSIS — I10 Essential (primary) hypertension: Secondary | ICD-10-CM | POA: Diagnosis not present

## 2022-12-12 DIAGNOSIS — I69318 Other symptoms and signs involving cognitive functions following cerebral infarction: Secondary | ICD-10-CM | POA: Diagnosis not present

## 2022-12-12 DIAGNOSIS — I69320 Aphasia following cerebral infarction: Secondary | ICD-10-CM | POA: Diagnosis not present

## 2022-12-12 DIAGNOSIS — F0153 Vascular dementia, unspecified severity, with mood disturbance: Secondary | ICD-10-CM | POA: Diagnosis not present

## 2022-12-12 DIAGNOSIS — I69351 Hemiplegia and hemiparesis following cerebral infarction affecting right dominant side: Secondary | ICD-10-CM | POA: Diagnosis not present

## 2022-12-12 DIAGNOSIS — I69391 Dysphagia following cerebral infarction: Secondary | ICD-10-CM | POA: Diagnosis not present

## 2022-12-12 DIAGNOSIS — I10 Essential (primary) hypertension: Secondary | ICD-10-CM | POA: Diagnosis not present

## 2022-12-15 DIAGNOSIS — K219 Gastro-esophageal reflux disease without esophagitis: Secondary | ICD-10-CM | POA: Diagnosis not present

## 2022-12-15 DIAGNOSIS — L89312 Pressure ulcer of right buttock, stage 2: Secondary | ICD-10-CM | POA: Diagnosis not present

## 2022-12-15 DIAGNOSIS — E785 Hyperlipidemia, unspecified: Secondary | ICD-10-CM | POA: Diagnosis not present

## 2022-12-15 DIAGNOSIS — I69391 Dysphagia following cerebral infarction: Secondary | ICD-10-CM | POA: Diagnosis not present

## 2022-12-15 DIAGNOSIS — I69320 Aphasia following cerebral infarction: Secondary | ICD-10-CM | POA: Diagnosis not present

## 2022-12-15 DIAGNOSIS — I10 Essential (primary) hypertension: Secondary | ICD-10-CM | POA: Diagnosis not present

## 2022-12-15 DIAGNOSIS — J449 Chronic obstructive pulmonary disease, unspecified: Secondary | ICD-10-CM | POA: Diagnosis not present

## 2022-12-15 DIAGNOSIS — F0153 Vascular dementia, unspecified severity, with mood disturbance: Secondary | ICD-10-CM | POA: Diagnosis not present

## 2022-12-15 DIAGNOSIS — I69351 Hemiplegia and hemiparesis following cerebral infarction affecting right dominant side: Secondary | ICD-10-CM | POA: Diagnosis not present

## 2022-12-15 DIAGNOSIS — Z6821 Body mass index (BMI) 21.0-21.9, adult: Secondary | ICD-10-CM | POA: Diagnosis not present

## 2022-12-15 DIAGNOSIS — I2510 Atherosclerotic heart disease of native coronary artery without angina pectoris: Secondary | ICD-10-CM | POA: Diagnosis not present

## 2022-12-15 DIAGNOSIS — I69318 Other symptoms and signs involving cognitive functions following cerebral infarction: Secondary | ICD-10-CM | POA: Diagnosis not present

## 2022-12-15 DIAGNOSIS — Z8616 Personal history of COVID-19: Secondary | ICD-10-CM | POA: Diagnosis not present

## 2022-12-19 DIAGNOSIS — I69391 Dysphagia following cerebral infarction: Secondary | ICD-10-CM | POA: Diagnosis not present

## 2022-12-19 DIAGNOSIS — I10 Essential (primary) hypertension: Secondary | ICD-10-CM | POA: Diagnosis not present

## 2022-12-19 DIAGNOSIS — I69320 Aphasia following cerebral infarction: Secondary | ICD-10-CM | POA: Diagnosis not present

## 2022-12-19 DIAGNOSIS — F0153 Vascular dementia, unspecified severity, with mood disturbance: Secondary | ICD-10-CM | POA: Diagnosis not present

## 2022-12-19 DIAGNOSIS — I69351 Hemiplegia and hemiparesis following cerebral infarction affecting right dominant side: Secondary | ICD-10-CM | POA: Diagnosis not present

## 2022-12-19 DIAGNOSIS — I69318 Other symptoms and signs involving cognitive functions following cerebral infarction: Secondary | ICD-10-CM | POA: Diagnosis not present

## 2022-12-26 DIAGNOSIS — I69318 Other symptoms and signs involving cognitive functions following cerebral infarction: Secondary | ICD-10-CM | POA: Diagnosis not present

## 2022-12-26 DIAGNOSIS — F0153 Vascular dementia, unspecified severity, with mood disturbance: Secondary | ICD-10-CM | POA: Diagnosis not present

## 2022-12-26 DIAGNOSIS — I69351 Hemiplegia and hemiparesis following cerebral infarction affecting right dominant side: Secondary | ICD-10-CM | POA: Diagnosis not present

## 2022-12-26 DIAGNOSIS — I69391 Dysphagia following cerebral infarction: Secondary | ICD-10-CM | POA: Diagnosis not present

## 2022-12-26 DIAGNOSIS — I69320 Aphasia following cerebral infarction: Secondary | ICD-10-CM | POA: Diagnosis not present

## 2022-12-26 DIAGNOSIS — I10 Essential (primary) hypertension: Secondary | ICD-10-CM | POA: Diagnosis not present

## 2023-01-02 DIAGNOSIS — F0153 Vascular dementia, unspecified severity, with mood disturbance: Secondary | ICD-10-CM | POA: Diagnosis not present

## 2023-01-02 DIAGNOSIS — I69318 Other symptoms and signs involving cognitive functions following cerebral infarction: Secondary | ICD-10-CM | POA: Diagnosis not present

## 2023-01-02 DIAGNOSIS — I10 Essential (primary) hypertension: Secondary | ICD-10-CM | POA: Diagnosis not present

## 2023-01-02 DIAGNOSIS — I69391 Dysphagia following cerebral infarction: Secondary | ICD-10-CM | POA: Diagnosis not present

## 2023-01-02 DIAGNOSIS — I69320 Aphasia following cerebral infarction: Secondary | ICD-10-CM | POA: Diagnosis not present

## 2023-01-02 DIAGNOSIS — I69351 Hemiplegia and hemiparesis following cerebral infarction affecting right dominant side: Secondary | ICD-10-CM | POA: Diagnosis not present

## 2023-01-09 DIAGNOSIS — F0153 Vascular dementia, unspecified severity, with mood disturbance: Secondary | ICD-10-CM | POA: Diagnosis not present

## 2023-01-09 DIAGNOSIS — I69391 Dysphagia following cerebral infarction: Secondary | ICD-10-CM | POA: Diagnosis not present

## 2023-01-09 DIAGNOSIS — I69320 Aphasia following cerebral infarction: Secondary | ICD-10-CM | POA: Diagnosis not present

## 2023-01-09 DIAGNOSIS — I10 Essential (primary) hypertension: Secondary | ICD-10-CM | POA: Diagnosis not present

## 2023-01-09 DIAGNOSIS — I69351 Hemiplegia and hemiparesis following cerebral infarction affecting right dominant side: Secondary | ICD-10-CM | POA: Diagnosis not present

## 2023-01-09 DIAGNOSIS — I69318 Other symptoms and signs involving cognitive functions following cerebral infarction: Secondary | ICD-10-CM | POA: Diagnosis not present

## 2023-01-10 DIAGNOSIS — I10 Essential (primary) hypertension: Secondary | ICD-10-CM | POA: Diagnosis not present

## 2023-01-10 DIAGNOSIS — I69351 Hemiplegia and hemiparesis following cerebral infarction affecting right dominant side: Secondary | ICD-10-CM | POA: Diagnosis not present

## 2023-01-10 DIAGNOSIS — I69318 Other symptoms and signs involving cognitive functions following cerebral infarction: Secondary | ICD-10-CM | POA: Diagnosis not present

## 2023-01-10 DIAGNOSIS — I69391 Dysphagia following cerebral infarction: Secondary | ICD-10-CM | POA: Diagnosis not present

## 2023-01-10 DIAGNOSIS — I69320 Aphasia following cerebral infarction: Secondary | ICD-10-CM | POA: Diagnosis not present

## 2023-01-10 DIAGNOSIS — F0153 Vascular dementia, unspecified severity, with mood disturbance: Secondary | ICD-10-CM | POA: Diagnosis not present

## 2023-01-11 DIAGNOSIS — I69391 Dysphagia following cerebral infarction: Secondary | ICD-10-CM | POA: Diagnosis not present

## 2023-01-11 DIAGNOSIS — F0153 Vascular dementia, unspecified severity, with mood disturbance: Secondary | ICD-10-CM | POA: Diagnosis not present

## 2023-01-11 DIAGNOSIS — I69351 Hemiplegia and hemiparesis following cerebral infarction affecting right dominant side: Secondary | ICD-10-CM | POA: Diagnosis not present

## 2023-01-11 DIAGNOSIS — I10 Essential (primary) hypertension: Secondary | ICD-10-CM | POA: Diagnosis not present

## 2023-01-11 DIAGNOSIS — I69320 Aphasia following cerebral infarction: Secondary | ICD-10-CM | POA: Diagnosis not present

## 2023-01-11 DIAGNOSIS — I69318 Other symptoms and signs involving cognitive functions following cerebral infarction: Secondary | ICD-10-CM | POA: Diagnosis not present

## 2023-01-13 DIAGNOSIS — I69351 Hemiplegia and hemiparesis following cerebral infarction affecting right dominant side: Secondary | ICD-10-CM | POA: Diagnosis not present

## 2023-01-13 DIAGNOSIS — F0153 Vascular dementia, unspecified severity, with mood disturbance: Secondary | ICD-10-CM | POA: Diagnosis not present

## 2023-01-13 DIAGNOSIS — I69318 Other symptoms and signs involving cognitive functions following cerebral infarction: Secondary | ICD-10-CM | POA: Diagnosis not present

## 2023-01-13 DIAGNOSIS — I10 Essential (primary) hypertension: Secondary | ICD-10-CM | POA: Diagnosis not present

## 2023-01-13 DIAGNOSIS — I69391 Dysphagia following cerebral infarction: Secondary | ICD-10-CM | POA: Diagnosis not present

## 2023-01-13 DIAGNOSIS — I69320 Aphasia following cerebral infarction: Secondary | ICD-10-CM | POA: Diagnosis not present

## 2023-01-15 DIAGNOSIS — F0153 Vascular dementia, unspecified severity, with mood disturbance: Secondary | ICD-10-CM | POA: Diagnosis not present

## 2023-01-15 DIAGNOSIS — E785 Hyperlipidemia, unspecified: Secondary | ICD-10-CM | POA: Diagnosis not present

## 2023-01-15 DIAGNOSIS — I69391 Dysphagia following cerebral infarction: Secondary | ICD-10-CM | POA: Diagnosis not present

## 2023-01-15 DIAGNOSIS — K219 Gastro-esophageal reflux disease without esophagitis: Secondary | ICD-10-CM | POA: Diagnosis not present

## 2023-01-15 DIAGNOSIS — L89312 Pressure ulcer of right buttock, stage 2: Secondary | ICD-10-CM | POA: Diagnosis not present

## 2023-01-15 DIAGNOSIS — I2510 Atherosclerotic heart disease of native coronary artery without angina pectoris: Secondary | ICD-10-CM | POA: Diagnosis not present

## 2023-01-15 DIAGNOSIS — I69320 Aphasia following cerebral infarction: Secondary | ICD-10-CM | POA: Diagnosis not present

## 2023-01-15 DIAGNOSIS — I10 Essential (primary) hypertension: Secondary | ICD-10-CM | POA: Diagnosis not present

## 2023-01-15 DIAGNOSIS — I69351 Hemiplegia and hemiparesis following cerebral infarction affecting right dominant side: Secondary | ICD-10-CM | POA: Diagnosis not present

## 2023-01-15 DIAGNOSIS — I69318 Other symptoms and signs involving cognitive functions following cerebral infarction: Secondary | ICD-10-CM | POA: Diagnosis not present

## 2023-01-15 DIAGNOSIS — Z8616 Personal history of COVID-19: Secondary | ICD-10-CM | POA: Diagnosis not present

## 2023-01-15 DIAGNOSIS — J449 Chronic obstructive pulmonary disease, unspecified: Secondary | ICD-10-CM | POA: Diagnosis not present

## 2023-01-16 DIAGNOSIS — I69391 Dysphagia following cerebral infarction: Secondary | ICD-10-CM | POA: Diagnosis not present

## 2023-01-16 DIAGNOSIS — I69351 Hemiplegia and hemiparesis following cerebral infarction affecting right dominant side: Secondary | ICD-10-CM | POA: Diagnosis not present

## 2023-01-16 DIAGNOSIS — I69320 Aphasia following cerebral infarction: Secondary | ICD-10-CM | POA: Diagnosis not present

## 2023-01-16 DIAGNOSIS — I10 Essential (primary) hypertension: Secondary | ICD-10-CM | POA: Diagnosis not present

## 2023-01-16 DIAGNOSIS — I69318 Other symptoms and signs involving cognitive functions following cerebral infarction: Secondary | ICD-10-CM | POA: Diagnosis not present

## 2023-01-16 DIAGNOSIS — F0153 Vascular dementia, unspecified severity, with mood disturbance: Secondary | ICD-10-CM | POA: Diagnosis not present

## 2023-01-17 DIAGNOSIS — I10 Essential (primary) hypertension: Secondary | ICD-10-CM | POA: Diagnosis not present

## 2023-01-17 DIAGNOSIS — I69318 Other symptoms and signs involving cognitive functions following cerebral infarction: Secondary | ICD-10-CM | POA: Diagnosis not present

## 2023-01-17 DIAGNOSIS — F0153 Vascular dementia, unspecified severity, with mood disturbance: Secondary | ICD-10-CM | POA: Diagnosis not present

## 2023-01-17 DIAGNOSIS — I69320 Aphasia following cerebral infarction: Secondary | ICD-10-CM | POA: Diagnosis not present

## 2023-01-17 DIAGNOSIS — I69351 Hemiplegia and hemiparesis following cerebral infarction affecting right dominant side: Secondary | ICD-10-CM | POA: Diagnosis not present

## 2023-01-17 DIAGNOSIS — I69391 Dysphagia following cerebral infarction: Secondary | ICD-10-CM | POA: Diagnosis not present

## 2023-01-18 DIAGNOSIS — F0153 Vascular dementia, unspecified severity, with mood disturbance: Secondary | ICD-10-CM | POA: Diagnosis not present

## 2023-01-18 DIAGNOSIS — I69318 Other symptoms and signs involving cognitive functions following cerebral infarction: Secondary | ICD-10-CM | POA: Diagnosis not present

## 2023-01-18 DIAGNOSIS — I10 Essential (primary) hypertension: Secondary | ICD-10-CM | POA: Diagnosis not present

## 2023-01-18 DIAGNOSIS — I69320 Aphasia following cerebral infarction: Secondary | ICD-10-CM | POA: Diagnosis not present

## 2023-01-18 DIAGNOSIS — I69351 Hemiplegia and hemiparesis following cerebral infarction affecting right dominant side: Secondary | ICD-10-CM | POA: Diagnosis not present

## 2023-01-18 DIAGNOSIS — I69391 Dysphagia following cerebral infarction: Secondary | ICD-10-CM | POA: Diagnosis not present

## 2023-01-19 DIAGNOSIS — I69320 Aphasia following cerebral infarction: Secondary | ICD-10-CM | POA: Diagnosis not present

## 2023-01-19 DIAGNOSIS — I10 Essential (primary) hypertension: Secondary | ICD-10-CM | POA: Diagnosis not present

## 2023-01-19 DIAGNOSIS — I69318 Other symptoms and signs involving cognitive functions following cerebral infarction: Secondary | ICD-10-CM | POA: Diagnosis not present

## 2023-01-19 DIAGNOSIS — I69351 Hemiplegia and hemiparesis following cerebral infarction affecting right dominant side: Secondary | ICD-10-CM | POA: Diagnosis not present

## 2023-01-19 DIAGNOSIS — I69391 Dysphagia following cerebral infarction: Secondary | ICD-10-CM | POA: Diagnosis not present

## 2023-01-19 DIAGNOSIS — F0153 Vascular dementia, unspecified severity, with mood disturbance: Secondary | ICD-10-CM | POA: Diagnosis not present

## 2023-01-20 DIAGNOSIS — I69320 Aphasia following cerebral infarction: Secondary | ICD-10-CM | POA: Diagnosis not present

## 2023-01-20 DIAGNOSIS — I69351 Hemiplegia and hemiparesis following cerebral infarction affecting right dominant side: Secondary | ICD-10-CM | POA: Diagnosis not present

## 2023-01-20 DIAGNOSIS — I10 Essential (primary) hypertension: Secondary | ICD-10-CM | POA: Diagnosis not present

## 2023-01-20 DIAGNOSIS — I69391 Dysphagia following cerebral infarction: Secondary | ICD-10-CM | POA: Diagnosis not present

## 2023-01-20 DIAGNOSIS — I69318 Other symptoms and signs involving cognitive functions following cerebral infarction: Secondary | ICD-10-CM | POA: Diagnosis not present

## 2023-01-20 DIAGNOSIS — F0153 Vascular dementia, unspecified severity, with mood disturbance: Secondary | ICD-10-CM | POA: Diagnosis not present

## 2023-01-21 DIAGNOSIS — I69320 Aphasia following cerebral infarction: Secondary | ICD-10-CM | POA: Diagnosis not present

## 2023-01-21 DIAGNOSIS — I69391 Dysphagia following cerebral infarction: Secondary | ICD-10-CM | POA: Diagnosis not present

## 2023-01-21 DIAGNOSIS — I69351 Hemiplegia and hemiparesis following cerebral infarction affecting right dominant side: Secondary | ICD-10-CM | POA: Diagnosis not present

## 2023-01-21 DIAGNOSIS — I69318 Other symptoms and signs involving cognitive functions following cerebral infarction: Secondary | ICD-10-CM | POA: Diagnosis not present

## 2023-01-21 DIAGNOSIS — I10 Essential (primary) hypertension: Secondary | ICD-10-CM | POA: Diagnosis not present

## 2023-01-21 DIAGNOSIS — F0153 Vascular dementia, unspecified severity, with mood disturbance: Secondary | ICD-10-CM | POA: Diagnosis not present

## 2023-01-22 DIAGNOSIS — I69320 Aphasia following cerebral infarction: Secondary | ICD-10-CM | POA: Diagnosis not present

## 2023-01-22 DIAGNOSIS — I69391 Dysphagia following cerebral infarction: Secondary | ICD-10-CM | POA: Diagnosis not present

## 2023-01-22 DIAGNOSIS — I69351 Hemiplegia and hemiparesis following cerebral infarction affecting right dominant side: Secondary | ICD-10-CM | POA: Diagnosis not present

## 2023-01-22 DIAGNOSIS — I10 Essential (primary) hypertension: Secondary | ICD-10-CM | POA: Diagnosis not present

## 2023-01-22 DIAGNOSIS — F0153 Vascular dementia, unspecified severity, with mood disturbance: Secondary | ICD-10-CM | POA: Diagnosis not present

## 2023-01-22 DIAGNOSIS — I69318 Other symptoms and signs involving cognitive functions following cerebral infarction: Secondary | ICD-10-CM | POA: Diagnosis not present

## 2023-01-23 DIAGNOSIS — I69351 Hemiplegia and hemiparesis following cerebral infarction affecting right dominant side: Secondary | ICD-10-CM | POA: Diagnosis not present

## 2023-01-23 DIAGNOSIS — I10 Essential (primary) hypertension: Secondary | ICD-10-CM | POA: Diagnosis not present

## 2023-01-23 DIAGNOSIS — F0153 Vascular dementia, unspecified severity, with mood disturbance: Secondary | ICD-10-CM | POA: Diagnosis not present

## 2023-01-23 DIAGNOSIS — I69318 Other symptoms and signs involving cognitive functions following cerebral infarction: Secondary | ICD-10-CM | POA: Diagnosis not present

## 2023-01-23 DIAGNOSIS — I69391 Dysphagia following cerebral infarction: Secondary | ICD-10-CM | POA: Diagnosis not present

## 2023-01-23 DIAGNOSIS — I69320 Aphasia following cerebral infarction: Secondary | ICD-10-CM | POA: Diagnosis not present

## 2023-01-24 DIAGNOSIS — I10 Essential (primary) hypertension: Secondary | ICD-10-CM | POA: Diagnosis not present

## 2023-01-24 DIAGNOSIS — I69320 Aphasia following cerebral infarction: Secondary | ICD-10-CM | POA: Diagnosis not present

## 2023-01-24 DIAGNOSIS — I69351 Hemiplegia and hemiparesis following cerebral infarction affecting right dominant side: Secondary | ICD-10-CM | POA: Diagnosis not present

## 2023-01-24 DIAGNOSIS — I69391 Dysphagia following cerebral infarction: Secondary | ICD-10-CM | POA: Diagnosis not present

## 2023-01-24 DIAGNOSIS — F0153 Vascular dementia, unspecified severity, with mood disturbance: Secondary | ICD-10-CM | POA: Diagnosis not present

## 2023-01-24 DIAGNOSIS — I69318 Other symptoms and signs involving cognitive functions following cerebral infarction: Secondary | ICD-10-CM | POA: Diagnosis not present

## 2023-01-25 DIAGNOSIS — I10 Essential (primary) hypertension: Secondary | ICD-10-CM | POA: Diagnosis not present

## 2023-01-25 DIAGNOSIS — I69318 Other symptoms and signs involving cognitive functions following cerebral infarction: Secondary | ICD-10-CM | POA: Diagnosis not present

## 2023-01-25 DIAGNOSIS — I69391 Dysphagia following cerebral infarction: Secondary | ICD-10-CM | POA: Diagnosis not present

## 2023-01-25 DIAGNOSIS — I69320 Aphasia following cerebral infarction: Secondary | ICD-10-CM | POA: Diagnosis not present

## 2023-01-25 DIAGNOSIS — F0153 Vascular dementia, unspecified severity, with mood disturbance: Secondary | ICD-10-CM | POA: Diagnosis not present

## 2023-01-25 DIAGNOSIS — I69351 Hemiplegia and hemiparesis following cerebral infarction affecting right dominant side: Secondary | ICD-10-CM | POA: Diagnosis not present

## 2023-01-26 DIAGNOSIS — I69391 Dysphagia following cerebral infarction: Secondary | ICD-10-CM | POA: Diagnosis not present

## 2023-01-26 DIAGNOSIS — I69351 Hemiplegia and hemiparesis following cerebral infarction affecting right dominant side: Secondary | ICD-10-CM | POA: Diagnosis not present

## 2023-01-26 DIAGNOSIS — I69318 Other symptoms and signs involving cognitive functions following cerebral infarction: Secondary | ICD-10-CM | POA: Diagnosis not present

## 2023-01-26 DIAGNOSIS — I69320 Aphasia following cerebral infarction: Secondary | ICD-10-CM | POA: Diagnosis not present

## 2023-01-26 DIAGNOSIS — I10 Essential (primary) hypertension: Secondary | ICD-10-CM | POA: Diagnosis not present

## 2023-01-26 DIAGNOSIS — F0153 Vascular dementia, unspecified severity, with mood disturbance: Secondary | ICD-10-CM | POA: Diagnosis not present

## 2023-01-27 DIAGNOSIS — I69320 Aphasia following cerebral infarction: Secondary | ICD-10-CM | POA: Diagnosis not present

## 2023-01-27 DIAGNOSIS — I69351 Hemiplegia and hemiparesis following cerebral infarction affecting right dominant side: Secondary | ICD-10-CM | POA: Diagnosis not present

## 2023-01-27 DIAGNOSIS — F0153 Vascular dementia, unspecified severity, with mood disturbance: Secondary | ICD-10-CM | POA: Diagnosis not present

## 2023-01-27 DIAGNOSIS — I69318 Other symptoms and signs involving cognitive functions following cerebral infarction: Secondary | ICD-10-CM | POA: Diagnosis not present

## 2023-01-27 DIAGNOSIS — I10 Essential (primary) hypertension: Secondary | ICD-10-CM | POA: Diagnosis not present

## 2023-01-27 DIAGNOSIS — I69391 Dysphagia following cerebral infarction: Secondary | ICD-10-CM | POA: Diagnosis not present

## 2023-01-28 DIAGNOSIS — I10 Essential (primary) hypertension: Secondary | ICD-10-CM | POA: Diagnosis not present

## 2023-01-28 DIAGNOSIS — I69351 Hemiplegia and hemiparesis following cerebral infarction affecting right dominant side: Secondary | ICD-10-CM | POA: Diagnosis not present

## 2023-01-28 DIAGNOSIS — F0153 Vascular dementia, unspecified severity, with mood disturbance: Secondary | ICD-10-CM | POA: Diagnosis not present

## 2023-01-28 DIAGNOSIS — I69320 Aphasia following cerebral infarction: Secondary | ICD-10-CM | POA: Diagnosis not present

## 2023-01-28 DIAGNOSIS — I69391 Dysphagia following cerebral infarction: Secondary | ICD-10-CM | POA: Diagnosis not present

## 2023-01-28 DIAGNOSIS — I69318 Other symptoms and signs involving cognitive functions following cerebral infarction: Secondary | ICD-10-CM | POA: Diagnosis not present

## 2023-01-29 DIAGNOSIS — I69391 Dysphagia following cerebral infarction: Secondary | ICD-10-CM | POA: Diagnosis not present

## 2023-01-29 DIAGNOSIS — I10 Essential (primary) hypertension: Secondary | ICD-10-CM | POA: Diagnosis not present

## 2023-01-29 DIAGNOSIS — I69320 Aphasia following cerebral infarction: Secondary | ICD-10-CM | POA: Diagnosis not present

## 2023-01-29 DIAGNOSIS — I69351 Hemiplegia and hemiparesis following cerebral infarction affecting right dominant side: Secondary | ICD-10-CM | POA: Diagnosis not present

## 2023-01-29 DIAGNOSIS — F0153 Vascular dementia, unspecified severity, with mood disturbance: Secondary | ICD-10-CM | POA: Diagnosis not present

## 2023-01-29 DIAGNOSIS — I69318 Other symptoms and signs involving cognitive functions following cerebral infarction: Secondary | ICD-10-CM | POA: Diagnosis not present

## 2023-01-30 DIAGNOSIS — I10 Essential (primary) hypertension: Secondary | ICD-10-CM | POA: Diagnosis not present

## 2023-01-30 DIAGNOSIS — I69391 Dysphagia following cerebral infarction: Secondary | ICD-10-CM | POA: Diagnosis not present

## 2023-01-30 DIAGNOSIS — F0153 Vascular dementia, unspecified severity, with mood disturbance: Secondary | ICD-10-CM | POA: Diagnosis not present

## 2023-01-30 DIAGNOSIS — I69351 Hemiplegia and hemiparesis following cerebral infarction affecting right dominant side: Secondary | ICD-10-CM | POA: Diagnosis not present

## 2023-01-30 DIAGNOSIS — I69320 Aphasia following cerebral infarction: Secondary | ICD-10-CM | POA: Diagnosis not present

## 2023-01-30 DIAGNOSIS — I69318 Other symptoms and signs involving cognitive functions following cerebral infarction: Secondary | ICD-10-CM | POA: Diagnosis not present

## 2023-01-31 DIAGNOSIS — I10 Essential (primary) hypertension: Secondary | ICD-10-CM | POA: Diagnosis not present

## 2023-01-31 DIAGNOSIS — F0153 Vascular dementia, unspecified severity, with mood disturbance: Secondary | ICD-10-CM | POA: Diagnosis not present

## 2023-01-31 DIAGNOSIS — I69391 Dysphagia following cerebral infarction: Secondary | ICD-10-CM | POA: Diagnosis not present

## 2023-01-31 DIAGNOSIS — I69351 Hemiplegia and hemiparesis following cerebral infarction affecting right dominant side: Secondary | ICD-10-CM | POA: Diagnosis not present

## 2023-01-31 DIAGNOSIS — I69320 Aphasia following cerebral infarction: Secondary | ICD-10-CM | POA: Diagnosis not present

## 2023-01-31 DIAGNOSIS — I69318 Other symptoms and signs involving cognitive functions following cerebral infarction: Secondary | ICD-10-CM | POA: Diagnosis not present

## 2023-02-01 DIAGNOSIS — F0153 Vascular dementia, unspecified severity, with mood disturbance: Secondary | ICD-10-CM | POA: Diagnosis not present

## 2023-02-01 DIAGNOSIS — I69318 Other symptoms and signs involving cognitive functions following cerebral infarction: Secondary | ICD-10-CM | POA: Diagnosis not present

## 2023-02-01 DIAGNOSIS — I10 Essential (primary) hypertension: Secondary | ICD-10-CM | POA: Diagnosis not present

## 2023-02-01 DIAGNOSIS — I69351 Hemiplegia and hemiparesis following cerebral infarction affecting right dominant side: Secondary | ICD-10-CM | POA: Diagnosis not present

## 2023-02-01 DIAGNOSIS — I69391 Dysphagia following cerebral infarction: Secondary | ICD-10-CM | POA: Diagnosis not present

## 2023-02-01 DIAGNOSIS — I69320 Aphasia following cerebral infarction: Secondary | ICD-10-CM | POA: Diagnosis not present

## 2023-02-02 DIAGNOSIS — I69391 Dysphagia following cerebral infarction: Secondary | ICD-10-CM | POA: Diagnosis not present

## 2023-02-02 DIAGNOSIS — F0153 Vascular dementia, unspecified severity, with mood disturbance: Secondary | ICD-10-CM | POA: Diagnosis not present

## 2023-02-02 DIAGNOSIS — I69351 Hemiplegia and hemiparesis following cerebral infarction affecting right dominant side: Secondary | ICD-10-CM | POA: Diagnosis not present

## 2023-02-02 DIAGNOSIS — I69320 Aphasia following cerebral infarction: Secondary | ICD-10-CM | POA: Diagnosis not present

## 2023-02-02 DIAGNOSIS — I69318 Other symptoms and signs involving cognitive functions following cerebral infarction: Secondary | ICD-10-CM | POA: Diagnosis not present

## 2023-02-02 DIAGNOSIS — I10 Essential (primary) hypertension: Secondary | ICD-10-CM | POA: Diagnosis not present

## 2023-02-03 DIAGNOSIS — I10 Essential (primary) hypertension: Secondary | ICD-10-CM | POA: Diagnosis not present

## 2023-02-03 DIAGNOSIS — I69351 Hemiplegia and hemiparesis following cerebral infarction affecting right dominant side: Secondary | ICD-10-CM | POA: Diagnosis not present

## 2023-02-03 DIAGNOSIS — I69320 Aphasia following cerebral infarction: Secondary | ICD-10-CM | POA: Diagnosis not present

## 2023-02-03 DIAGNOSIS — I69391 Dysphagia following cerebral infarction: Secondary | ICD-10-CM | POA: Diagnosis not present

## 2023-02-03 DIAGNOSIS — F0153 Vascular dementia, unspecified severity, with mood disturbance: Secondary | ICD-10-CM | POA: Diagnosis not present

## 2023-02-03 DIAGNOSIS — I69318 Other symptoms and signs involving cognitive functions following cerebral infarction: Secondary | ICD-10-CM | POA: Diagnosis not present

## 2023-02-04 DIAGNOSIS — I69318 Other symptoms and signs involving cognitive functions following cerebral infarction: Secondary | ICD-10-CM | POA: Diagnosis not present

## 2023-02-04 DIAGNOSIS — F0153 Vascular dementia, unspecified severity, with mood disturbance: Secondary | ICD-10-CM | POA: Diagnosis not present

## 2023-02-04 DIAGNOSIS — I69320 Aphasia following cerebral infarction: Secondary | ICD-10-CM | POA: Diagnosis not present

## 2023-02-04 DIAGNOSIS — I69351 Hemiplegia and hemiparesis following cerebral infarction affecting right dominant side: Secondary | ICD-10-CM | POA: Diagnosis not present

## 2023-02-04 DIAGNOSIS — I69391 Dysphagia following cerebral infarction: Secondary | ICD-10-CM | POA: Diagnosis not present

## 2023-02-04 DIAGNOSIS — I10 Essential (primary) hypertension: Secondary | ICD-10-CM | POA: Diagnosis not present

## 2023-02-05 DIAGNOSIS — I69391 Dysphagia following cerebral infarction: Secondary | ICD-10-CM | POA: Diagnosis not present

## 2023-02-05 DIAGNOSIS — F0153 Vascular dementia, unspecified severity, with mood disturbance: Secondary | ICD-10-CM | POA: Diagnosis not present

## 2023-02-05 DIAGNOSIS — I10 Essential (primary) hypertension: Secondary | ICD-10-CM | POA: Diagnosis not present

## 2023-02-05 DIAGNOSIS — I69320 Aphasia following cerebral infarction: Secondary | ICD-10-CM | POA: Diagnosis not present

## 2023-02-05 DIAGNOSIS — I69351 Hemiplegia and hemiparesis following cerebral infarction affecting right dominant side: Secondary | ICD-10-CM | POA: Diagnosis not present

## 2023-02-05 DIAGNOSIS — I69318 Other symptoms and signs involving cognitive functions following cerebral infarction: Secondary | ICD-10-CM | POA: Diagnosis not present

## 2023-02-06 DIAGNOSIS — I10 Essential (primary) hypertension: Secondary | ICD-10-CM | POA: Diagnosis not present

## 2023-02-06 DIAGNOSIS — I69391 Dysphagia following cerebral infarction: Secondary | ICD-10-CM | POA: Diagnosis not present

## 2023-02-06 DIAGNOSIS — I69318 Other symptoms and signs involving cognitive functions following cerebral infarction: Secondary | ICD-10-CM | POA: Diagnosis not present

## 2023-02-06 DIAGNOSIS — F0153 Vascular dementia, unspecified severity, with mood disturbance: Secondary | ICD-10-CM | POA: Diagnosis not present

## 2023-02-06 DIAGNOSIS — I69351 Hemiplegia and hemiparesis following cerebral infarction affecting right dominant side: Secondary | ICD-10-CM | POA: Diagnosis not present

## 2023-02-06 DIAGNOSIS — I69320 Aphasia following cerebral infarction: Secondary | ICD-10-CM | POA: Diagnosis not present

## 2023-02-07 DIAGNOSIS — I69391 Dysphagia following cerebral infarction: Secondary | ICD-10-CM | POA: Diagnosis not present

## 2023-02-07 DIAGNOSIS — I69318 Other symptoms and signs involving cognitive functions following cerebral infarction: Secondary | ICD-10-CM | POA: Diagnosis not present

## 2023-02-07 DIAGNOSIS — I69320 Aphasia following cerebral infarction: Secondary | ICD-10-CM | POA: Diagnosis not present

## 2023-02-07 DIAGNOSIS — F0153 Vascular dementia, unspecified severity, with mood disturbance: Secondary | ICD-10-CM | POA: Diagnosis not present

## 2023-02-07 DIAGNOSIS — I10 Essential (primary) hypertension: Secondary | ICD-10-CM | POA: Diagnosis not present

## 2023-02-07 DIAGNOSIS — I69351 Hemiplegia and hemiparesis following cerebral infarction affecting right dominant side: Secondary | ICD-10-CM | POA: Diagnosis not present

## 2023-02-08 DIAGNOSIS — F0153 Vascular dementia, unspecified severity, with mood disturbance: Secondary | ICD-10-CM | POA: Diagnosis not present

## 2023-02-08 DIAGNOSIS — I69391 Dysphagia following cerebral infarction: Secondary | ICD-10-CM | POA: Diagnosis not present

## 2023-02-08 DIAGNOSIS — I69320 Aphasia following cerebral infarction: Secondary | ICD-10-CM | POA: Diagnosis not present

## 2023-02-08 DIAGNOSIS — I69318 Other symptoms and signs involving cognitive functions following cerebral infarction: Secondary | ICD-10-CM | POA: Diagnosis not present

## 2023-02-08 DIAGNOSIS — I10 Essential (primary) hypertension: Secondary | ICD-10-CM | POA: Diagnosis not present

## 2023-02-08 DIAGNOSIS — I69351 Hemiplegia and hemiparesis following cerebral infarction affecting right dominant side: Secondary | ICD-10-CM | POA: Diagnosis not present

## 2023-02-09 DIAGNOSIS — I69391 Dysphagia following cerebral infarction: Secondary | ICD-10-CM | POA: Diagnosis not present

## 2023-02-09 DIAGNOSIS — I10 Essential (primary) hypertension: Secondary | ICD-10-CM | POA: Diagnosis not present

## 2023-02-09 DIAGNOSIS — F0153 Vascular dementia, unspecified severity, with mood disturbance: Secondary | ICD-10-CM | POA: Diagnosis not present

## 2023-02-09 DIAGNOSIS — I69351 Hemiplegia and hemiparesis following cerebral infarction affecting right dominant side: Secondary | ICD-10-CM | POA: Diagnosis not present

## 2023-02-09 DIAGNOSIS — I69318 Other symptoms and signs involving cognitive functions following cerebral infarction: Secondary | ICD-10-CM | POA: Diagnosis not present

## 2023-02-09 DIAGNOSIS — I69320 Aphasia following cerebral infarction: Secondary | ICD-10-CM | POA: Diagnosis not present

## 2023-02-10 DIAGNOSIS — I69318 Other symptoms and signs involving cognitive functions following cerebral infarction: Secondary | ICD-10-CM | POA: Diagnosis not present

## 2023-02-10 DIAGNOSIS — I69351 Hemiplegia and hemiparesis following cerebral infarction affecting right dominant side: Secondary | ICD-10-CM | POA: Diagnosis not present

## 2023-02-10 DIAGNOSIS — I69391 Dysphagia following cerebral infarction: Secondary | ICD-10-CM | POA: Diagnosis not present

## 2023-02-10 DIAGNOSIS — I69320 Aphasia following cerebral infarction: Secondary | ICD-10-CM | POA: Diagnosis not present

## 2023-02-10 DIAGNOSIS — I10 Essential (primary) hypertension: Secondary | ICD-10-CM | POA: Diagnosis not present

## 2023-02-10 DIAGNOSIS — F0153 Vascular dementia, unspecified severity, with mood disturbance: Secondary | ICD-10-CM | POA: Diagnosis not present

## 2023-02-11 DIAGNOSIS — F0153 Vascular dementia, unspecified severity, with mood disturbance: Secondary | ICD-10-CM | POA: Diagnosis not present

## 2023-02-11 DIAGNOSIS — I69391 Dysphagia following cerebral infarction: Secondary | ICD-10-CM | POA: Diagnosis not present

## 2023-02-11 DIAGNOSIS — I69318 Other symptoms and signs involving cognitive functions following cerebral infarction: Secondary | ICD-10-CM | POA: Diagnosis not present

## 2023-02-11 DIAGNOSIS — I69351 Hemiplegia and hemiparesis following cerebral infarction affecting right dominant side: Secondary | ICD-10-CM | POA: Diagnosis not present

## 2023-02-11 DIAGNOSIS — I10 Essential (primary) hypertension: Secondary | ICD-10-CM | POA: Diagnosis not present

## 2023-02-11 DIAGNOSIS — I69320 Aphasia following cerebral infarction: Secondary | ICD-10-CM | POA: Diagnosis not present

## 2023-02-12 DIAGNOSIS — I69318 Other symptoms and signs involving cognitive functions following cerebral infarction: Secondary | ICD-10-CM | POA: Diagnosis not present

## 2023-02-12 DIAGNOSIS — I10 Essential (primary) hypertension: Secondary | ICD-10-CM | POA: Diagnosis not present

## 2023-02-12 DIAGNOSIS — F0153 Vascular dementia, unspecified severity, with mood disturbance: Secondary | ICD-10-CM | POA: Diagnosis not present

## 2023-02-12 DIAGNOSIS — I69391 Dysphagia following cerebral infarction: Secondary | ICD-10-CM | POA: Diagnosis not present

## 2023-02-12 DIAGNOSIS — I69351 Hemiplegia and hemiparesis following cerebral infarction affecting right dominant side: Secondary | ICD-10-CM | POA: Diagnosis not present

## 2023-02-12 DIAGNOSIS — I69320 Aphasia following cerebral infarction: Secondary | ICD-10-CM | POA: Diagnosis not present

## 2023-02-13 DIAGNOSIS — I69351 Hemiplegia and hemiparesis following cerebral infarction affecting right dominant side: Secondary | ICD-10-CM | POA: Diagnosis not present

## 2023-02-13 DIAGNOSIS — I10 Essential (primary) hypertension: Secondary | ICD-10-CM | POA: Diagnosis not present

## 2023-02-13 DIAGNOSIS — F0153 Vascular dementia, unspecified severity, with mood disturbance: Secondary | ICD-10-CM | POA: Diagnosis not present

## 2023-02-13 DIAGNOSIS — I69318 Other symptoms and signs involving cognitive functions following cerebral infarction: Secondary | ICD-10-CM | POA: Diagnosis not present

## 2023-02-13 DIAGNOSIS — I69391 Dysphagia following cerebral infarction: Secondary | ICD-10-CM | POA: Diagnosis not present

## 2023-02-13 DIAGNOSIS — I69320 Aphasia following cerebral infarction: Secondary | ICD-10-CM | POA: Diagnosis not present

## 2023-02-14 DIAGNOSIS — L89312 Pressure ulcer of right buttock, stage 2: Secondary | ICD-10-CM | POA: Diagnosis not present

## 2023-02-14 DIAGNOSIS — K219 Gastro-esophageal reflux disease without esophagitis: Secondary | ICD-10-CM | POA: Diagnosis not present

## 2023-02-14 DIAGNOSIS — I69318 Other symptoms and signs involving cognitive functions following cerebral infarction: Secondary | ICD-10-CM | POA: Diagnosis not present

## 2023-02-14 DIAGNOSIS — I2510 Atherosclerotic heart disease of native coronary artery without angina pectoris: Secondary | ICD-10-CM | POA: Diagnosis not present

## 2023-02-14 DIAGNOSIS — I10 Essential (primary) hypertension: Secondary | ICD-10-CM | POA: Diagnosis not present

## 2023-02-14 DIAGNOSIS — E785 Hyperlipidemia, unspecified: Secondary | ICD-10-CM | POA: Diagnosis not present

## 2023-02-14 DIAGNOSIS — F0153 Vascular dementia, unspecified severity, with mood disturbance: Secondary | ICD-10-CM | POA: Diagnosis not present

## 2023-02-14 DIAGNOSIS — I69391 Dysphagia following cerebral infarction: Secondary | ICD-10-CM | POA: Diagnosis not present

## 2023-02-14 DIAGNOSIS — I69320 Aphasia following cerebral infarction: Secondary | ICD-10-CM | POA: Diagnosis not present

## 2023-02-14 DIAGNOSIS — I69351 Hemiplegia and hemiparesis following cerebral infarction affecting right dominant side: Secondary | ICD-10-CM | POA: Diagnosis not present

## 2023-02-14 DIAGNOSIS — Z8616 Personal history of COVID-19: Secondary | ICD-10-CM | POA: Diagnosis not present

## 2023-02-14 DIAGNOSIS — J449 Chronic obstructive pulmonary disease, unspecified: Secondary | ICD-10-CM | POA: Diagnosis not present

## 2023-02-15 DIAGNOSIS — I69391 Dysphagia following cerebral infarction: Secondary | ICD-10-CM | POA: Diagnosis not present

## 2023-02-15 DIAGNOSIS — I10 Essential (primary) hypertension: Secondary | ICD-10-CM | POA: Diagnosis not present

## 2023-02-15 DIAGNOSIS — I69318 Other symptoms and signs involving cognitive functions following cerebral infarction: Secondary | ICD-10-CM | POA: Diagnosis not present

## 2023-02-15 DIAGNOSIS — F0153 Vascular dementia, unspecified severity, with mood disturbance: Secondary | ICD-10-CM | POA: Diagnosis not present

## 2023-02-15 DIAGNOSIS — I69320 Aphasia following cerebral infarction: Secondary | ICD-10-CM | POA: Diagnosis not present

## 2023-02-15 DIAGNOSIS — I69351 Hemiplegia and hemiparesis following cerebral infarction affecting right dominant side: Secondary | ICD-10-CM | POA: Diagnosis not present

## 2023-02-16 DIAGNOSIS — F0153 Vascular dementia, unspecified severity, with mood disturbance: Secondary | ICD-10-CM | POA: Diagnosis not present

## 2023-02-16 DIAGNOSIS — I10 Essential (primary) hypertension: Secondary | ICD-10-CM | POA: Diagnosis not present

## 2023-02-16 DIAGNOSIS — I69351 Hemiplegia and hemiparesis following cerebral infarction affecting right dominant side: Secondary | ICD-10-CM | POA: Diagnosis not present

## 2023-02-16 DIAGNOSIS — I69318 Other symptoms and signs involving cognitive functions following cerebral infarction: Secondary | ICD-10-CM | POA: Diagnosis not present

## 2023-02-16 DIAGNOSIS — I69391 Dysphagia following cerebral infarction: Secondary | ICD-10-CM | POA: Diagnosis not present

## 2023-02-16 DIAGNOSIS — I69320 Aphasia following cerebral infarction: Secondary | ICD-10-CM | POA: Diagnosis not present

## 2023-02-17 DIAGNOSIS — I69351 Hemiplegia and hemiparesis following cerebral infarction affecting right dominant side: Secondary | ICD-10-CM | POA: Diagnosis not present

## 2023-02-17 DIAGNOSIS — I69318 Other symptoms and signs involving cognitive functions following cerebral infarction: Secondary | ICD-10-CM | POA: Diagnosis not present

## 2023-02-17 DIAGNOSIS — F0153 Vascular dementia, unspecified severity, with mood disturbance: Secondary | ICD-10-CM | POA: Diagnosis not present

## 2023-02-17 DIAGNOSIS — I69320 Aphasia following cerebral infarction: Secondary | ICD-10-CM | POA: Diagnosis not present

## 2023-02-17 DIAGNOSIS — I69391 Dysphagia following cerebral infarction: Secondary | ICD-10-CM | POA: Diagnosis not present

## 2023-02-17 DIAGNOSIS — I10 Essential (primary) hypertension: Secondary | ICD-10-CM | POA: Diagnosis not present

## 2023-02-18 DIAGNOSIS — F0153 Vascular dementia, unspecified severity, with mood disturbance: Secondary | ICD-10-CM | POA: Diagnosis not present

## 2023-02-18 DIAGNOSIS — I69391 Dysphagia following cerebral infarction: Secondary | ICD-10-CM | POA: Diagnosis not present

## 2023-02-18 DIAGNOSIS — I69320 Aphasia following cerebral infarction: Secondary | ICD-10-CM | POA: Diagnosis not present

## 2023-02-18 DIAGNOSIS — I69351 Hemiplegia and hemiparesis following cerebral infarction affecting right dominant side: Secondary | ICD-10-CM | POA: Diagnosis not present

## 2023-02-18 DIAGNOSIS — I69318 Other symptoms and signs involving cognitive functions following cerebral infarction: Secondary | ICD-10-CM | POA: Diagnosis not present

## 2023-02-18 DIAGNOSIS — I10 Essential (primary) hypertension: Secondary | ICD-10-CM | POA: Diagnosis not present

## 2023-02-19 DIAGNOSIS — I69391 Dysphagia following cerebral infarction: Secondary | ICD-10-CM | POA: Diagnosis not present

## 2023-02-19 DIAGNOSIS — I69351 Hemiplegia and hemiparesis following cerebral infarction affecting right dominant side: Secondary | ICD-10-CM | POA: Diagnosis not present

## 2023-02-19 DIAGNOSIS — F0153 Vascular dementia, unspecified severity, with mood disturbance: Secondary | ICD-10-CM | POA: Diagnosis not present

## 2023-02-19 DIAGNOSIS — I10 Essential (primary) hypertension: Secondary | ICD-10-CM | POA: Diagnosis not present

## 2023-02-19 DIAGNOSIS — I69320 Aphasia following cerebral infarction: Secondary | ICD-10-CM | POA: Diagnosis not present

## 2023-02-19 DIAGNOSIS — I69318 Other symptoms and signs involving cognitive functions following cerebral infarction: Secondary | ICD-10-CM | POA: Diagnosis not present

## 2023-02-20 DIAGNOSIS — I10 Essential (primary) hypertension: Secondary | ICD-10-CM | POA: Diagnosis not present

## 2023-02-20 DIAGNOSIS — F0153 Vascular dementia, unspecified severity, with mood disturbance: Secondary | ICD-10-CM | POA: Diagnosis not present

## 2023-02-20 DIAGNOSIS — I69391 Dysphagia following cerebral infarction: Secondary | ICD-10-CM | POA: Diagnosis not present

## 2023-02-20 DIAGNOSIS — I69320 Aphasia following cerebral infarction: Secondary | ICD-10-CM | POA: Diagnosis not present

## 2023-02-20 DIAGNOSIS — I69351 Hemiplegia and hemiparesis following cerebral infarction affecting right dominant side: Secondary | ICD-10-CM | POA: Diagnosis not present

## 2023-02-20 DIAGNOSIS — I69318 Other symptoms and signs involving cognitive functions following cerebral infarction: Secondary | ICD-10-CM | POA: Diagnosis not present

## 2023-02-21 DIAGNOSIS — I69318 Other symptoms and signs involving cognitive functions following cerebral infarction: Secondary | ICD-10-CM | POA: Diagnosis not present

## 2023-02-21 DIAGNOSIS — I10 Essential (primary) hypertension: Secondary | ICD-10-CM | POA: Diagnosis not present

## 2023-02-21 DIAGNOSIS — I69320 Aphasia following cerebral infarction: Secondary | ICD-10-CM | POA: Diagnosis not present

## 2023-02-21 DIAGNOSIS — I69391 Dysphagia following cerebral infarction: Secondary | ICD-10-CM | POA: Diagnosis not present

## 2023-02-21 DIAGNOSIS — I69351 Hemiplegia and hemiparesis following cerebral infarction affecting right dominant side: Secondary | ICD-10-CM | POA: Diagnosis not present

## 2023-02-21 DIAGNOSIS — F0153 Vascular dementia, unspecified severity, with mood disturbance: Secondary | ICD-10-CM | POA: Diagnosis not present

## 2023-02-22 DIAGNOSIS — I10 Essential (primary) hypertension: Secondary | ICD-10-CM | POA: Diagnosis not present

## 2023-02-22 DIAGNOSIS — F0153 Vascular dementia, unspecified severity, with mood disturbance: Secondary | ICD-10-CM | POA: Diagnosis not present

## 2023-02-22 DIAGNOSIS — I69391 Dysphagia following cerebral infarction: Secondary | ICD-10-CM | POA: Diagnosis not present

## 2023-02-22 DIAGNOSIS — I69320 Aphasia following cerebral infarction: Secondary | ICD-10-CM | POA: Diagnosis not present

## 2023-02-22 DIAGNOSIS — I69351 Hemiplegia and hemiparesis following cerebral infarction affecting right dominant side: Secondary | ICD-10-CM | POA: Diagnosis not present

## 2023-02-22 DIAGNOSIS — I69318 Other symptoms and signs involving cognitive functions following cerebral infarction: Secondary | ICD-10-CM | POA: Diagnosis not present

## 2023-02-23 DIAGNOSIS — I69318 Other symptoms and signs involving cognitive functions following cerebral infarction: Secondary | ICD-10-CM | POA: Diagnosis not present

## 2023-02-23 DIAGNOSIS — I10 Essential (primary) hypertension: Secondary | ICD-10-CM | POA: Diagnosis not present

## 2023-02-23 DIAGNOSIS — I69320 Aphasia following cerebral infarction: Secondary | ICD-10-CM | POA: Diagnosis not present

## 2023-02-23 DIAGNOSIS — F0153 Vascular dementia, unspecified severity, with mood disturbance: Secondary | ICD-10-CM | POA: Diagnosis not present

## 2023-02-23 DIAGNOSIS — I69391 Dysphagia following cerebral infarction: Secondary | ICD-10-CM | POA: Diagnosis not present

## 2023-02-23 DIAGNOSIS — I69351 Hemiplegia and hemiparesis following cerebral infarction affecting right dominant side: Secondary | ICD-10-CM | POA: Diagnosis not present

## 2023-02-24 DIAGNOSIS — I69318 Other symptoms and signs involving cognitive functions following cerebral infarction: Secondary | ICD-10-CM | POA: Diagnosis not present

## 2023-02-24 DIAGNOSIS — I69320 Aphasia following cerebral infarction: Secondary | ICD-10-CM | POA: Diagnosis not present

## 2023-02-24 DIAGNOSIS — I10 Essential (primary) hypertension: Secondary | ICD-10-CM | POA: Diagnosis not present

## 2023-02-24 DIAGNOSIS — I69351 Hemiplegia and hemiparesis following cerebral infarction affecting right dominant side: Secondary | ICD-10-CM | POA: Diagnosis not present

## 2023-02-24 DIAGNOSIS — F0153 Vascular dementia, unspecified severity, with mood disturbance: Secondary | ICD-10-CM | POA: Diagnosis not present

## 2023-02-24 DIAGNOSIS — I69391 Dysphagia following cerebral infarction: Secondary | ICD-10-CM | POA: Diagnosis not present

## 2023-02-25 DIAGNOSIS — I10 Essential (primary) hypertension: Secondary | ICD-10-CM | POA: Diagnosis not present

## 2023-02-25 DIAGNOSIS — F0153 Vascular dementia, unspecified severity, with mood disturbance: Secondary | ICD-10-CM | POA: Diagnosis not present

## 2023-02-25 DIAGNOSIS — I69320 Aphasia following cerebral infarction: Secondary | ICD-10-CM | POA: Diagnosis not present

## 2023-02-25 DIAGNOSIS — I69351 Hemiplegia and hemiparesis following cerebral infarction affecting right dominant side: Secondary | ICD-10-CM | POA: Diagnosis not present

## 2023-02-25 DIAGNOSIS — I69391 Dysphagia following cerebral infarction: Secondary | ICD-10-CM | POA: Diagnosis not present

## 2023-02-25 DIAGNOSIS — I69318 Other symptoms and signs involving cognitive functions following cerebral infarction: Secondary | ICD-10-CM | POA: Diagnosis not present

## 2023-02-26 DIAGNOSIS — F0153 Vascular dementia, unspecified severity, with mood disturbance: Secondary | ICD-10-CM | POA: Diagnosis not present

## 2023-02-26 DIAGNOSIS — I10 Essential (primary) hypertension: Secondary | ICD-10-CM | POA: Diagnosis not present

## 2023-02-26 DIAGNOSIS — I69391 Dysphagia following cerebral infarction: Secondary | ICD-10-CM | POA: Diagnosis not present

## 2023-02-26 DIAGNOSIS — I69351 Hemiplegia and hemiparesis following cerebral infarction affecting right dominant side: Secondary | ICD-10-CM | POA: Diagnosis not present

## 2023-02-26 DIAGNOSIS — I69320 Aphasia following cerebral infarction: Secondary | ICD-10-CM | POA: Diagnosis not present

## 2023-02-26 DIAGNOSIS — I69318 Other symptoms and signs involving cognitive functions following cerebral infarction: Secondary | ICD-10-CM | POA: Diagnosis not present

## 2023-02-27 DIAGNOSIS — F0153 Vascular dementia, unspecified severity, with mood disturbance: Secondary | ICD-10-CM | POA: Diagnosis not present

## 2023-02-27 DIAGNOSIS — I69320 Aphasia following cerebral infarction: Secondary | ICD-10-CM | POA: Diagnosis not present

## 2023-02-27 DIAGNOSIS — I69391 Dysphagia following cerebral infarction: Secondary | ICD-10-CM | POA: Diagnosis not present

## 2023-02-27 DIAGNOSIS — I69318 Other symptoms and signs involving cognitive functions following cerebral infarction: Secondary | ICD-10-CM | POA: Diagnosis not present

## 2023-02-27 DIAGNOSIS — I69351 Hemiplegia and hemiparesis following cerebral infarction affecting right dominant side: Secondary | ICD-10-CM | POA: Diagnosis not present

## 2023-02-27 DIAGNOSIS — I10 Essential (primary) hypertension: Secondary | ICD-10-CM | POA: Diagnosis not present

## 2023-02-28 DIAGNOSIS — I69391 Dysphagia following cerebral infarction: Secondary | ICD-10-CM | POA: Diagnosis not present

## 2023-02-28 DIAGNOSIS — I69320 Aphasia following cerebral infarction: Secondary | ICD-10-CM | POA: Diagnosis not present

## 2023-02-28 DIAGNOSIS — I69318 Other symptoms and signs involving cognitive functions following cerebral infarction: Secondary | ICD-10-CM | POA: Diagnosis not present

## 2023-02-28 DIAGNOSIS — I69351 Hemiplegia and hemiparesis following cerebral infarction affecting right dominant side: Secondary | ICD-10-CM | POA: Diagnosis not present

## 2023-02-28 DIAGNOSIS — I10 Essential (primary) hypertension: Secondary | ICD-10-CM | POA: Diagnosis not present

## 2023-02-28 DIAGNOSIS — F0153 Vascular dementia, unspecified severity, with mood disturbance: Secondary | ICD-10-CM | POA: Diagnosis not present

## 2023-03-01 DIAGNOSIS — I69318 Other symptoms and signs involving cognitive functions following cerebral infarction: Secondary | ICD-10-CM | POA: Diagnosis not present

## 2023-03-01 DIAGNOSIS — I10 Essential (primary) hypertension: Secondary | ICD-10-CM | POA: Diagnosis not present

## 2023-03-01 DIAGNOSIS — I69351 Hemiplegia and hemiparesis following cerebral infarction affecting right dominant side: Secondary | ICD-10-CM | POA: Diagnosis not present

## 2023-03-01 DIAGNOSIS — F0153 Vascular dementia, unspecified severity, with mood disturbance: Secondary | ICD-10-CM | POA: Diagnosis not present

## 2023-03-01 DIAGNOSIS — I69320 Aphasia following cerebral infarction: Secondary | ICD-10-CM | POA: Diagnosis not present

## 2023-03-01 DIAGNOSIS — I69391 Dysphagia following cerebral infarction: Secondary | ICD-10-CM | POA: Diagnosis not present

## 2023-03-02 DIAGNOSIS — I69391 Dysphagia following cerebral infarction: Secondary | ICD-10-CM | POA: Diagnosis not present

## 2023-03-02 DIAGNOSIS — I69318 Other symptoms and signs involving cognitive functions following cerebral infarction: Secondary | ICD-10-CM | POA: Diagnosis not present

## 2023-03-02 DIAGNOSIS — I10 Essential (primary) hypertension: Secondary | ICD-10-CM | POA: Diagnosis not present

## 2023-03-02 DIAGNOSIS — I69351 Hemiplegia and hemiparesis following cerebral infarction affecting right dominant side: Secondary | ICD-10-CM | POA: Diagnosis not present

## 2023-03-02 DIAGNOSIS — F0153 Vascular dementia, unspecified severity, with mood disturbance: Secondary | ICD-10-CM | POA: Diagnosis not present

## 2023-03-02 DIAGNOSIS — I69320 Aphasia following cerebral infarction: Secondary | ICD-10-CM | POA: Diagnosis not present

## 2023-03-03 DIAGNOSIS — I69318 Other symptoms and signs involving cognitive functions following cerebral infarction: Secondary | ICD-10-CM | POA: Diagnosis not present

## 2023-03-03 DIAGNOSIS — I69351 Hemiplegia and hemiparesis following cerebral infarction affecting right dominant side: Secondary | ICD-10-CM | POA: Diagnosis not present

## 2023-03-03 DIAGNOSIS — I69320 Aphasia following cerebral infarction: Secondary | ICD-10-CM | POA: Diagnosis not present

## 2023-03-03 DIAGNOSIS — I10 Essential (primary) hypertension: Secondary | ICD-10-CM | POA: Diagnosis not present

## 2023-03-03 DIAGNOSIS — I69391 Dysphagia following cerebral infarction: Secondary | ICD-10-CM | POA: Diagnosis not present

## 2023-03-03 DIAGNOSIS — F0153 Vascular dementia, unspecified severity, with mood disturbance: Secondary | ICD-10-CM | POA: Diagnosis not present

## 2023-03-04 DIAGNOSIS — F0153 Vascular dementia, unspecified severity, with mood disturbance: Secondary | ICD-10-CM | POA: Diagnosis not present

## 2023-03-04 DIAGNOSIS — I69391 Dysphagia following cerebral infarction: Secondary | ICD-10-CM | POA: Diagnosis not present

## 2023-03-04 DIAGNOSIS — I10 Essential (primary) hypertension: Secondary | ICD-10-CM | POA: Diagnosis not present

## 2023-03-04 DIAGNOSIS — I69351 Hemiplegia and hemiparesis following cerebral infarction affecting right dominant side: Secondary | ICD-10-CM | POA: Diagnosis not present

## 2023-03-04 DIAGNOSIS — I69318 Other symptoms and signs involving cognitive functions following cerebral infarction: Secondary | ICD-10-CM | POA: Diagnosis not present

## 2023-03-04 DIAGNOSIS — I69320 Aphasia following cerebral infarction: Secondary | ICD-10-CM | POA: Diagnosis not present

## 2023-03-05 DIAGNOSIS — F0153 Vascular dementia, unspecified severity, with mood disturbance: Secondary | ICD-10-CM | POA: Diagnosis not present

## 2023-03-05 DIAGNOSIS — I69320 Aphasia following cerebral infarction: Secondary | ICD-10-CM | POA: Diagnosis not present

## 2023-03-05 DIAGNOSIS — I69391 Dysphagia following cerebral infarction: Secondary | ICD-10-CM | POA: Diagnosis not present

## 2023-03-05 DIAGNOSIS — I69351 Hemiplegia and hemiparesis following cerebral infarction affecting right dominant side: Secondary | ICD-10-CM | POA: Diagnosis not present

## 2023-03-05 DIAGNOSIS — I10 Essential (primary) hypertension: Secondary | ICD-10-CM | POA: Diagnosis not present

## 2023-03-05 DIAGNOSIS — I69318 Other symptoms and signs involving cognitive functions following cerebral infarction: Secondary | ICD-10-CM | POA: Diagnosis not present

## 2023-03-06 DIAGNOSIS — F0153 Vascular dementia, unspecified severity, with mood disturbance: Secondary | ICD-10-CM | POA: Diagnosis not present

## 2023-03-06 DIAGNOSIS — I69391 Dysphagia following cerebral infarction: Secondary | ICD-10-CM | POA: Diagnosis not present

## 2023-03-06 DIAGNOSIS — I69318 Other symptoms and signs involving cognitive functions following cerebral infarction: Secondary | ICD-10-CM | POA: Diagnosis not present

## 2023-03-06 DIAGNOSIS — I69320 Aphasia following cerebral infarction: Secondary | ICD-10-CM | POA: Diagnosis not present

## 2023-03-06 DIAGNOSIS — I10 Essential (primary) hypertension: Secondary | ICD-10-CM | POA: Diagnosis not present

## 2023-03-06 DIAGNOSIS — I69351 Hemiplegia and hemiparesis following cerebral infarction affecting right dominant side: Secondary | ICD-10-CM | POA: Diagnosis not present

## 2023-03-07 DIAGNOSIS — I69318 Other symptoms and signs involving cognitive functions following cerebral infarction: Secondary | ICD-10-CM | POA: Diagnosis not present

## 2023-03-07 DIAGNOSIS — F0153 Vascular dementia, unspecified severity, with mood disturbance: Secondary | ICD-10-CM | POA: Diagnosis not present

## 2023-03-07 DIAGNOSIS — I69320 Aphasia following cerebral infarction: Secondary | ICD-10-CM | POA: Diagnosis not present

## 2023-03-07 DIAGNOSIS — I10 Essential (primary) hypertension: Secondary | ICD-10-CM | POA: Diagnosis not present

## 2023-03-07 DIAGNOSIS — I69351 Hemiplegia and hemiparesis following cerebral infarction affecting right dominant side: Secondary | ICD-10-CM | POA: Diagnosis not present

## 2023-03-07 DIAGNOSIS — I69391 Dysphagia following cerebral infarction: Secondary | ICD-10-CM | POA: Diagnosis not present

## 2023-03-08 DIAGNOSIS — I10 Essential (primary) hypertension: Secondary | ICD-10-CM | POA: Diagnosis not present

## 2023-03-08 DIAGNOSIS — F0153 Vascular dementia, unspecified severity, with mood disturbance: Secondary | ICD-10-CM | POA: Diagnosis not present

## 2023-03-08 DIAGNOSIS — I69318 Other symptoms and signs involving cognitive functions following cerebral infarction: Secondary | ICD-10-CM | POA: Diagnosis not present

## 2023-03-08 DIAGNOSIS — I69320 Aphasia following cerebral infarction: Secondary | ICD-10-CM | POA: Diagnosis not present

## 2023-03-08 DIAGNOSIS — I69391 Dysphagia following cerebral infarction: Secondary | ICD-10-CM | POA: Diagnosis not present

## 2023-03-08 DIAGNOSIS — I69351 Hemiplegia and hemiparesis following cerebral infarction affecting right dominant side: Secondary | ICD-10-CM | POA: Diagnosis not present

## 2023-03-09 DIAGNOSIS — I69320 Aphasia following cerebral infarction: Secondary | ICD-10-CM | POA: Diagnosis not present

## 2023-03-09 DIAGNOSIS — I10 Essential (primary) hypertension: Secondary | ICD-10-CM | POA: Diagnosis not present

## 2023-03-09 DIAGNOSIS — F0153 Vascular dementia, unspecified severity, with mood disturbance: Secondary | ICD-10-CM | POA: Diagnosis not present

## 2023-03-09 DIAGNOSIS — I69351 Hemiplegia and hemiparesis following cerebral infarction affecting right dominant side: Secondary | ICD-10-CM | POA: Diagnosis not present

## 2023-03-09 DIAGNOSIS — I69318 Other symptoms and signs involving cognitive functions following cerebral infarction: Secondary | ICD-10-CM | POA: Diagnosis not present

## 2023-03-09 DIAGNOSIS — I69391 Dysphagia following cerebral infarction: Secondary | ICD-10-CM | POA: Diagnosis not present

## 2023-03-10 DIAGNOSIS — I69351 Hemiplegia and hemiparesis following cerebral infarction affecting right dominant side: Secondary | ICD-10-CM | POA: Diagnosis not present

## 2023-03-10 DIAGNOSIS — I10 Essential (primary) hypertension: Secondary | ICD-10-CM | POA: Diagnosis not present

## 2023-03-10 DIAGNOSIS — I69318 Other symptoms and signs involving cognitive functions following cerebral infarction: Secondary | ICD-10-CM | POA: Diagnosis not present

## 2023-03-10 DIAGNOSIS — F0153 Vascular dementia, unspecified severity, with mood disturbance: Secondary | ICD-10-CM | POA: Diagnosis not present

## 2023-03-10 DIAGNOSIS — I69391 Dysphagia following cerebral infarction: Secondary | ICD-10-CM | POA: Diagnosis not present

## 2023-03-10 DIAGNOSIS — I69320 Aphasia following cerebral infarction: Secondary | ICD-10-CM | POA: Diagnosis not present

## 2023-03-11 DIAGNOSIS — F0153 Vascular dementia, unspecified severity, with mood disturbance: Secondary | ICD-10-CM | POA: Diagnosis not present

## 2023-03-11 DIAGNOSIS — I10 Essential (primary) hypertension: Secondary | ICD-10-CM | POA: Diagnosis not present

## 2023-03-11 DIAGNOSIS — I69391 Dysphagia following cerebral infarction: Secondary | ICD-10-CM | POA: Diagnosis not present

## 2023-03-11 DIAGNOSIS — I69320 Aphasia following cerebral infarction: Secondary | ICD-10-CM | POA: Diagnosis not present

## 2023-03-11 DIAGNOSIS — I69351 Hemiplegia and hemiparesis following cerebral infarction affecting right dominant side: Secondary | ICD-10-CM | POA: Diagnosis not present

## 2023-03-11 DIAGNOSIS — I69318 Other symptoms and signs involving cognitive functions following cerebral infarction: Secondary | ICD-10-CM | POA: Diagnosis not present

## 2023-03-12 DIAGNOSIS — F0153 Vascular dementia, unspecified severity, with mood disturbance: Secondary | ICD-10-CM | POA: Diagnosis not present

## 2023-03-12 DIAGNOSIS — I69318 Other symptoms and signs involving cognitive functions following cerebral infarction: Secondary | ICD-10-CM | POA: Diagnosis not present

## 2023-03-12 DIAGNOSIS — I69391 Dysphagia following cerebral infarction: Secondary | ICD-10-CM | POA: Diagnosis not present

## 2023-03-12 DIAGNOSIS — I69320 Aphasia following cerebral infarction: Secondary | ICD-10-CM | POA: Diagnosis not present

## 2023-03-12 DIAGNOSIS — I69351 Hemiplegia and hemiparesis following cerebral infarction affecting right dominant side: Secondary | ICD-10-CM | POA: Diagnosis not present

## 2023-03-12 DIAGNOSIS — I10 Essential (primary) hypertension: Secondary | ICD-10-CM | POA: Diagnosis not present

## 2023-03-13 DIAGNOSIS — I69318 Other symptoms and signs involving cognitive functions following cerebral infarction: Secondary | ICD-10-CM | POA: Diagnosis not present

## 2023-03-13 DIAGNOSIS — F0153 Vascular dementia, unspecified severity, with mood disturbance: Secondary | ICD-10-CM | POA: Diagnosis not present

## 2023-03-13 DIAGNOSIS — I69391 Dysphagia following cerebral infarction: Secondary | ICD-10-CM | POA: Diagnosis not present

## 2023-03-13 DIAGNOSIS — I69351 Hemiplegia and hemiparesis following cerebral infarction affecting right dominant side: Secondary | ICD-10-CM | POA: Diagnosis not present

## 2023-03-13 DIAGNOSIS — I10 Essential (primary) hypertension: Secondary | ICD-10-CM | POA: Diagnosis not present

## 2023-03-13 DIAGNOSIS — I69320 Aphasia following cerebral infarction: Secondary | ICD-10-CM | POA: Diagnosis not present

## 2023-03-14 DIAGNOSIS — F0153 Vascular dementia, unspecified severity, with mood disturbance: Secondary | ICD-10-CM | POA: Diagnosis not present

## 2023-03-14 DIAGNOSIS — I69318 Other symptoms and signs involving cognitive functions following cerebral infarction: Secondary | ICD-10-CM | POA: Diagnosis not present

## 2023-03-14 DIAGNOSIS — I69391 Dysphagia following cerebral infarction: Secondary | ICD-10-CM | POA: Diagnosis not present

## 2023-03-14 DIAGNOSIS — I69320 Aphasia following cerebral infarction: Secondary | ICD-10-CM | POA: Diagnosis not present

## 2023-03-14 DIAGNOSIS — I69351 Hemiplegia and hemiparesis following cerebral infarction affecting right dominant side: Secondary | ICD-10-CM | POA: Diagnosis not present

## 2023-03-14 DIAGNOSIS — I10 Essential (primary) hypertension: Secondary | ICD-10-CM | POA: Diagnosis not present

## 2023-03-15 DIAGNOSIS — I69320 Aphasia following cerebral infarction: Secondary | ICD-10-CM | POA: Diagnosis not present

## 2023-03-15 DIAGNOSIS — I10 Essential (primary) hypertension: Secondary | ICD-10-CM | POA: Diagnosis not present

## 2023-03-15 DIAGNOSIS — I69318 Other symptoms and signs involving cognitive functions following cerebral infarction: Secondary | ICD-10-CM | POA: Diagnosis not present

## 2023-03-15 DIAGNOSIS — I69391 Dysphagia following cerebral infarction: Secondary | ICD-10-CM | POA: Diagnosis not present

## 2023-03-15 DIAGNOSIS — F0153 Vascular dementia, unspecified severity, with mood disturbance: Secondary | ICD-10-CM | POA: Diagnosis not present

## 2023-03-15 DIAGNOSIS — I69351 Hemiplegia and hemiparesis following cerebral infarction affecting right dominant side: Secondary | ICD-10-CM | POA: Diagnosis not present

## 2023-03-16 DIAGNOSIS — I69318 Other symptoms and signs involving cognitive functions following cerebral infarction: Secondary | ICD-10-CM | POA: Diagnosis not present

## 2023-03-16 DIAGNOSIS — F0153 Vascular dementia, unspecified severity, with mood disturbance: Secondary | ICD-10-CM | POA: Diagnosis not present

## 2023-03-16 DIAGNOSIS — I69391 Dysphagia following cerebral infarction: Secondary | ICD-10-CM | POA: Diagnosis not present

## 2023-03-16 DIAGNOSIS — I10 Essential (primary) hypertension: Secondary | ICD-10-CM | POA: Diagnosis not present

## 2023-03-16 DIAGNOSIS — I69320 Aphasia following cerebral infarction: Secondary | ICD-10-CM | POA: Diagnosis not present

## 2023-03-16 DIAGNOSIS — I69351 Hemiplegia and hemiparesis following cerebral infarction affecting right dominant side: Secondary | ICD-10-CM | POA: Diagnosis not present

## 2023-03-17 DIAGNOSIS — K219 Gastro-esophageal reflux disease without esophagitis: Secondary | ICD-10-CM | POA: Diagnosis not present

## 2023-03-17 DIAGNOSIS — I69318 Other symptoms and signs involving cognitive functions following cerebral infarction: Secondary | ICD-10-CM | POA: Diagnosis not present

## 2023-03-17 DIAGNOSIS — I69320 Aphasia following cerebral infarction: Secondary | ICD-10-CM | POA: Diagnosis not present

## 2023-03-17 DIAGNOSIS — I69391 Dysphagia following cerebral infarction: Secondary | ICD-10-CM | POA: Diagnosis not present

## 2023-03-17 DIAGNOSIS — L89312 Pressure ulcer of right buttock, stage 2: Secondary | ICD-10-CM | POA: Diagnosis not present

## 2023-03-17 DIAGNOSIS — E785 Hyperlipidemia, unspecified: Secondary | ICD-10-CM | POA: Diagnosis not present

## 2023-03-17 DIAGNOSIS — I10 Essential (primary) hypertension: Secondary | ICD-10-CM | POA: Diagnosis not present

## 2023-03-17 DIAGNOSIS — I69351 Hemiplegia and hemiparesis following cerebral infarction affecting right dominant side: Secondary | ICD-10-CM | POA: Diagnosis not present

## 2023-03-17 DIAGNOSIS — F0153 Vascular dementia, unspecified severity, with mood disturbance: Secondary | ICD-10-CM | POA: Diagnosis not present

## 2023-03-17 DIAGNOSIS — J449 Chronic obstructive pulmonary disease, unspecified: Secondary | ICD-10-CM | POA: Diagnosis not present

## 2023-03-17 DIAGNOSIS — I2510 Atherosclerotic heart disease of native coronary artery without angina pectoris: Secondary | ICD-10-CM | POA: Diagnosis not present

## 2023-03-17 DIAGNOSIS — Z8616 Personal history of COVID-19: Secondary | ICD-10-CM | POA: Diagnosis not present

## 2023-03-20 DIAGNOSIS — I69391 Dysphagia following cerebral infarction: Secondary | ICD-10-CM | POA: Diagnosis not present

## 2023-03-20 DIAGNOSIS — F0153 Vascular dementia, unspecified severity, with mood disturbance: Secondary | ICD-10-CM | POA: Diagnosis not present

## 2023-03-20 DIAGNOSIS — I69351 Hemiplegia and hemiparesis following cerebral infarction affecting right dominant side: Secondary | ICD-10-CM | POA: Diagnosis not present

## 2023-03-20 DIAGNOSIS — I10 Essential (primary) hypertension: Secondary | ICD-10-CM | POA: Diagnosis not present

## 2023-03-20 DIAGNOSIS — I69318 Other symptoms and signs involving cognitive functions following cerebral infarction: Secondary | ICD-10-CM | POA: Diagnosis not present

## 2023-03-20 DIAGNOSIS — I69320 Aphasia following cerebral infarction: Secondary | ICD-10-CM | POA: Diagnosis not present

## 2023-03-27 DIAGNOSIS — I69320 Aphasia following cerebral infarction: Secondary | ICD-10-CM | POA: Diagnosis not present

## 2023-03-27 DIAGNOSIS — F0153 Vascular dementia, unspecified severity, with mood disturbance: Secondary | ICD-10-CM | POA: Diagnosis not present

## 2023-03-27 DIAGNOSIS — I69351 Hemiplegia and hemiparesis following cerebral infarction affecting right dominant side: Secondary | ICD-10-CM | POA: Diagnosis not present

## 2023-03-27 DIAGNOSIS — I69391 Dysphagia following cerebral infarction: Secondary | ICD-10-CM | POA: Diagnosis not present

## 2023-03-27 DIAGNOSIS — I69318 Other symptoms and signs involving cognitive functions following cerebral infarction: Secondary | ICD-10-CM | POA: Diagnosis not present

## 2023-03-27 DIAGNOSIS — I10 Essential (primary) hypertension: Secondary | ICD-10-CM | POA: Diagnosis not present

## 2023-04-03 DIAGNOSIS — I69391 Dysphagia following cerebral infarction: Secondary | ICD-10-CM | POA: Diagnosis not present

## 2023-04-03 DIAGNOSIS — F0153 Vascular dementia, unspecified severity, with mood disturbance: Secondary | ICD-10-CM | POA: Diagnosis not present

## 2023-04-03 DIAGNOSIS — I69318 Other symptoms and signs involving cognitive functions following cerebral infarction: Secondary | ICD-10-CM | POA: Diagnosis not present

## 2023-04-03 DIAGNOSIS — I69351 Hemiplegia and hemiparesis following cerebral infarction affecting right dominant side: Secondary | ICD-10-CM | POA: Diagnosis not present

## 2023-04-03 DIAGNOSIS — I10 Essential (primary) hypertension: Secondary | ICD-10-CM | POA: Diagnosis not present

## 2023-04-03 DIAGNOSIS — I69320 Aphasia following cerebral infarction: Secondary | ICD-10-CM | POA: Diagnosis not present

## 2023-04-05 DIAGNOSIS — I69391 Dysphagia following cerebral infarction: Secondary | ICD-10-CM | POA: Diagnosis not present

## 2023-04-05 DIAGNOSIS — I69318 Other symptoms and signs involving cognitive functions following cerebral infarction: Secondary | ICD-10-CM | POA: Diagnosis not present

## 2023-04-05 DIAGNOSIS — F0153 Vascular dementia, unspecified severity, with mood disturbance: Secondary | ICD-10-CM | POA: Diagnosis not present

## 2023-04-05 DIAGNOSIS — I10 Essential (primary) hypertension: Secondary | ICD-10-CM | POA: Diagnosis not present

## 2023-04-05 DIAGNOSIS — I69351 Hemiplegia and hemiparesis following cerebral infarction affecting right dominant side: Secondary | ICD-10-CM | POA: Diagnosis not present

## 2023-04-05 DIAGNOSIS — I69320 Aphasia following cerebral infarction: Secondary | ICD-10-CM | POA: Diagnosis not present

## 2023-04-10 DIAGNOSIS — I69318 Other symptoms and signs involving cognitive functions following cerebral infarction: Secondary | ICD-10-CM | POA: Diagnosis not present

## 2023-04-10 DIAGNOSIS — I69351 Hemiplegia and hemiparesis following cerebral infarction affecting right dominant side: Secondary | ICD-10-CM | POA: Diagnosis not present

## 2023-04-10 DIAGNOSIS — F0153 Vascular dementia, unspecified severity, with mood disturbance: Secondary | ICD-10-CM | POA: Diagnosis not present

## 2023-04-10 DIAGNOSIS — I10 Essential (primary) hypertension: Secondary | ICD-10-CM | POA: Diagnosis not present

## 2023-04-10 DIAGNOSIS — I69320 Aphasia following cerebral infarction: Secondary | ICD-10-CM | POA: Diagnosis not present

## 2023-04-10 DIAGNOSIS — I69391 Dysphagia following cerebral infarction: Secondary | ICD-10-CM | POA: Diagnosis not present

## 2023-04-16 DIAGNOSIS — Z8616 Personal history of COVID-19: Secondary | ICD-10-CM | POA: Diagnosis not present

## 2023-04-16 DIAGNOSIS — I10 Essential (primary) hypertension: Secondary | ICD-10-CM | POA: Diagnosis not present

## 2023-04-16 DIAGNOSIS — I69318 Other symptoms and signs involving cognitive functions following cerebral infarction: Secondary | ICD-10-CM | POA: Diagnosis not present

## 2023-04-16 DIAGNOSIS — I69320 Aphasia following cerebral infarction: Secondary | ICD-10-CM | POA: Diagnosis not present

## 2023-04-16 DIAGNOSIS — K219 Gastro-esophageal reflux disease without esophagitis: Secondary | ICD-10-CM | POA: Diagnosis not present

## 2023-04-16 DIAGNOSIS — L89312 Pressure ulcer of right buttock, stage 2: Secondary | ICD-10-CM | POA: Diagnosis not present

## 2023-04-16 DIAGNOSIS — I69391 Dysphagia following cerebral infarction: Secondary | ICD-10-CM | POA: Diagnosis not present

## 2023-04-16 DIAGNOSIS — I2510 Atherosclerotic heart disease of native coronary artery without angina pectoris: Secondary | ICD-10-CM | POA: Diagnosis not present

## 2023-04-16 DIAGNOSIS — F0153 Vascular dementia, unspecified severity, with mood disturbance: Secondary | ICD-10-CM | POA: Diagnosis not present

## 2023-04-16 DIAGNOSIS — E785 Hyperlipidemia, unspecified: Secondary | ICD-10-CM | POA: Diagnosis not present

## 2023-04-16 DIAGNOSIS — J449 Chronic obstructive pulmonary disease, unspecified: Secondary | ICD-10-CM | POA: Diagnosis not present

## 2023-04-16 DIAGNOSIS — I69351 Hemiplegia and hemiparesis following cerebral infarction affecting right dominant side: Secondary | ICD-10-CM | POA: Diagnosis not present

## 2023-04-17 DIAGNOSIS — I69391 Dysphagia following cerebral infarction: Secondary | ICD-10-CM | POA: Diagnosis not present

## 2023-04-17 DIAGNOSIS — F0153 Vascular dementia, unspecified severity, with mood disturbance: Secondary | ICD-10-CM | POA: Diagnosis not present

## 2023-04-17 DIAGNOSIS — I10 Essential (primary) hypertension: Secondary | ICD-10-CM | POA: Diagnosis not present

## 2023-04-17 DIAGNOSIS — I69351 Hemiplegia and hemiparesis following cerebral infarction affecting right dominant side: Secondary | ICD-10-CM | POA: Diagnosis not present

## 2023-04-17 DIAGNOSIS — I69318 Other symptoms and signs involving cognitive functions following cerebral infarction: Secondary | ICD-10-CM | POA: Diagnosis not present

## 2023-04-17 DIAGNOSIS — I69320 Aphasia following cerebral infarction: Secondary | ICD-10-CM | POA: Diagnosis not present

## 2023-04-24 DIAGNOSIS — I69391 Dysphagia following cerebral infarction: Secondary | ICD-10-CM | POA: Diagnosis not present

## 2023-04-24 DIAGNOSIS — I69318 Other symptoms and signs involving cognitive functions following cerebral infarction: Secondary | ICD-10-CM | POA: Diagnosis not present

## 2023-04-24 DIAGNOSIS — F0153 Vascular dementia, unspecified severity, with mood disturbance: Secondary | ICD-10-CM | POA: Diagnosis not present

## 2023-04-24 DIAGNOSIS — I10 Essential (primary) hypertension: Secondary | ICD-10-CM | POA: Diagnosis not present

## 2023-04-24 DIAGNOSIS — I69320 Aphasia following cerebral infarction: Secondary | ICD-10-CM | POA: Diagnosis not present

## 2023-04-24 DIAGNOSIS — I69351 Hemiplegia and hemiparesis following cerebral infarction affecting right dominant side: Secondary | ICD-10-CM | POA: Diagnosis not present

## 2023-04-28 DIAGNOSIS — F0153 Vascular dementia, unspecified severity, with mood disturbance: Secondary | ICD-10-CM | POA: Diagnosis not present

## 2023-04-28 DIAGNOSIS — I69320 Aphasia following cerebral infarction: Secondary | ICD-10-CM | POA: Diagnosis not present

## 2023-04-28 DIAGNOSIS — I10 Essential (primary) hypertension: Secondary | ICD-10-CM | POA: Diagnosis not present

## 2023-04-28 DIAGNOSIS — I69391 Dysphagia following cerebral infarction: Secondary | ICD-10-CM | POA: Diagnosis not present

## 2023-04-28 DIAGNOSIS — I69351 Hemiplegia and hemiparesis following cerebral infarction affecting right dominant side: Secondary | ICD-10-CM | POA: Diagnosis not present

## 2023-04-28 DIAGNOSIS — I69318 Other symptoms and signs involving cognitive functions following cerebral infarction: Secondary | ICD-10-CM | POA: Diagnosis not present

## 2023-05-01 DIAGNOSIS — I69320 Aphasia following cerebral infarction: Secondary | ICD-10-CM | POA: Diagnosis not present

## 2023-05-01 DIAGNOSIS — I10 Essential (primary) hypertension: Secondary | ICD-10-CM | POA: Diagnosis not present

## 2023-05-01 DIAGNOSIS — I69391 Dysphagia following cerebral infarction: Secondary | ICD-10-CM | POA: Diagnosis not present

## 2023-05-01 DIAGNOSIS — I69318 Other symptoms and signs involving cognitive functions following cerebral infarction: Secondary | ICD-10-CM | POA: Diagnosis not present

## 2023-05-01 DIAGNOSIS — F0153 Vascular dementia, unspecified severity, with mood disturbance: Secondary | ICD-10-CM | POA: Diagnosis not present

## 2023-05-01 DIAGNOSIS — I69351 Hemiplegia and hemiparesis following cerebral infarction affecting right dominant side: Secondary | ICD-10-CM | POA: Diagnosis not present

## 2023-05-08 DIAGNOSIS — I10 Essential (primary) hypertension: Secondary | ICD-10-CM | POA: Diagnosis not present

## 2023-05-08 DIAGNOSIS — I69320 Aphasia following cerebral infarction: Secondary | ICD-10-CM | POA: Diagnosis not present

## 2023-05-08 DIAGNOSIS — I69318 Other symptoms and signs involving cognitive functions following cerebral infarction: Secondary | ICD-10-CM | POA: Diagnosis not present

## 2023-05-08 DIAGNOSIS — I69351 Hemiplegia and hemiparesis following cerebral infarction affecting right dominant side: Secondary | ICD-10-CM | POA: Diagnosis not present

## 2023-05-08 DIAGNOSIS — I69391 Dysphagia following cerebral infarction: Secondary | ICD-10-CM | POA: Diagnosis not present

## 2023-05-08 DIAGNOSIS — F0153 Vascular dementia, unspecified severity, with mood disturbance: Secondary | ICD-10-CM | POA: Diagnosis not present

## 2023-05-15 DIAGNOSIS — I69351 Hemiplegia and hemiparesis following cerebral infarction affecting right dominant side: Secondary | ICD-10-CM | POA: Diagnosis not present

## 2023-05-15 DIAGNOSIS — F0153 Vascular dementia, unspecified severity, with mood disturbance: Secondary | ICD-10-CM | POA: Diagnosis not present

## 2023-05-15 DIAGNOSIS — I69391 Dysphagia following cerebral infarction: Secondary | ICD-10-CM | POA: Diagnosis not present

## 2023-05-15 DIAGNOSIS — I10 Essential (primary) hypertension: Secondary | ICD-10-CM | POA: Diagnosis not present

## 2023-05-15 DIAGNOSIS — I69318 Other symptoms and signs involving cognitive functions following cerebral infarction: Secondary | ICD-10-CM | POA: Diagnosis not present

## 2023-05-15 DIAGNOSIS — I69320 Aphasia following cerebral infarction: Secondary | ICD-10-CM | POA: Diagnosis not present

## 2023-05-16 DIAGNOSIS — I69391 Dysphagia following cerebral infarction: Secondary | ICD-10-CM | POA: Diagnosis not present

## 2023-05-16 DIAGNOSIS — I69320 Aphasia following cerebral infarction: Secondary | ICD-10-CM | POA: Diagnosis not present

## 2023-05-16 DIAGNOSIS — I10 Essential (primary) hypertension: Secondary | ICD-10-CM | POA: Diagnosis not present

## 2023-05-16 DIAGNOSIS — I69318 Other symptoms and signs involving cognitive functions following cerebral infarction: Secondary | ICD-10-CM | POA: Diagnosis not present

## 2023-05-16 DIAGNOSIS — I69351 Hemiplegia and hemiparesis following cerebral infarction affecting right dominant side: Secondary | ICD-10-CM | POA: Diagnosis not present

## 2023-05-16 DIAGNOSIS — F0153 Vascular dementia, unspecified severity, with mood disturbance: Secondary | ICD-10-CM | POA: Diagnosis not present

## 2023-05-17 DIAGNOSIS — K219 Gastro-esophageal reflux disease without esophagitis: Secondary | ICD-10-CM | POA: Diagnosis not present

## 2023-05-17 DIAGNOSIS — I69391 Dysphagia following cerebral infarction: Secondary | ICD-10-CM | POA: Diagnosis not present

## 2023-05-17 DIAGNOSIS — I69351 Hemiplegia and hemiparesis following cerebral infarction affecting right dominant side: Secondary | ICD-10-CM | POA: Diagnosis not present

## 2023-05-17 DIAGNOSIS — F0153 Vascular dementia, unspecified severity, with mood disturbance: Secondary | ICD-10-CM | POA: Diagnosis not present

## 2023-05-17 DIAGNOSIS — I10 Essential (primary) hypertension: Secondary | ICD-10-CM | POA: Diagnosis not present

## 2023-05-17 DIAGNOSIS — J449 Chronic obstructive pulmonary disease, unspecified: Secondary | ICD-10-CM | POA: Diagnosis not present

## 2023-05-17 DIAGNOSIS — L89312 Pressure ulcer of right buttock, stage 2: Secondary | ICD-10-CM | POA: Diagnosis not present

## 2023-05-17 DIAGNOSIS — I2510 Atherosclerotic heart disease of native coronary artery without angina pectoris: Secondary | ICD-10-CM | POA: Diagnosis not present

## 2023-05-17 DIAGNOSIS — Z8616 Personal history of COVID-19: Secondary | ICD-10-CM | POA: Diagnosis not present

## 2023-05-17 DIAGNOSIS — I69320 Aphasia following cerebral infarction: Secondary | ICD-10-CM | POA: Diagnosis not present

## 2023-05-17 DIAGNOSIS — E785 Hyperlipidemia, unspecified: Secondary | ICD-10-CM | POA: Diagnosis not present

## 2023-05-17 DIAGNOSIS — I69318 Other symptoms and signs involving cognitive functions following cerebral infarction: Secondary | ICD-10-CM | POA: Diagnosis not present

## 2023-05-22 DIAGNOSIS — I69351 Hemiplegia and hemiparesis following cerebral infarction affecting right dominant side: Secondary | ICD-10-CM | POA: Diagnosis not present

## 2023-05-22 DIAGNOSIS — I69391 Dysphagia following cerebral infarction: Secondary | ICD-10-CM | POA: Diagnosis not present

## 2023-05-22 DIAGNOSIS — I69320 Aphasia following cerebral infarction: Secondary | ICD-10-CM | POA: Diagnosis not present

## 2023-05-22 DIAGNOSIS — F0153 Vascular dementia, unspecified severity, with mood disturbance: Secondary | ICD-10-CM | POA: Diagnosis not present

## 2023-05-22 DIAGNOSIS — I69318 Other symptoms and signs involving cognitive functions following cerebral infarction: Secondary | ICD-10-CM | POA: Diagnosis not present

## 2023-05-22 DIAGNOSIS — I10 Essential (primary) hypertension: Secondary | ICD-10-CM | POA: Diagnosis not present

## 2023-05-29 DIAGNOSIS — I69320 Aphasia following cerebral infarction: Secondary | ICD-10-CM | POA: Diagnosis not present

## 2023-05-29 DIAGNOSIS — I10 Essential (primary) hypertension: Secondary | ICD-10-CM | POA: Diagnosis not present

## 2023-05-29 DIAGNOSIS — I69391 Dysphagia following cerebral infarction: Secondary | ICD-10-CM | POA: Diagnosis not present

## 2023-05-29 DIAGNOSIS — F0153 Vascular dementia, unspecified severity, with mood disturbance: Secondary | ICD-10-CM | POA: Diagnosis not present

## 2023-05-29 DIAGNOSIS — I69351 Hemiplegia and hemiparesis following cerebral infarction affecting right dominant side: Secondary | ICD-10-CM | POA: Diagnosis not present

## 2023-05-29 DIAGNOSIS — I69318 Other symptoms and signs involving cognitive functions following cerebral infarction: Secondary | ICD-10-CM | POA: Diagnosis not present

## 2023-05-30 DIAGNOSIS — F0153 Vascular dementia, unspecified severity, with mood disturbance: Secondary | ICD-10-CM | POA: Diagnosis not present

## 2023-05-30 DIAGNOSIS — I69318 Other symptoms and signs involving cognitive functions following cerebral infarction: Secondary | ICD-10-CM | POA: Diagnosis not present

## 2023-05-30 DIAGNOSIS — I69351 Hemiplegia and hemiparesis following cerebral infarction affecting right dominant side: Secondary | ICD-10-CM | POA: Diagnosis not present

## 2023-05-30 DIAGNOSIS — I10 Essential (primary) hypertension: Secondary | ICD-10-CM | POA: Diagnosis not present

## 2023-05-30 DIAGNOSIS — I69391 Dysphagia following cerebral infarction: Secondary | ICD-10-CM | POA: Diagnosis not present

## 2023-05-30 DIAGNOSIS — I69320 Aphasia following cerebral infarction: Secondary | ICD-10-CM | POA: Diagnosis not present

## 2023-06-06 DIAGNOSIS — I69391 Dysphagia following cerebral infarction: Secondary | ICD-10-CM | POA: Diagnosis not present

## 2023-06-06 DIAGNOSIS — I69320 Aphasia following cerebral infarction: Secondary | ICD-10-CM | POA: Diagnosis not present

## 2023-06-06 DIAGNOSIS — I69351 Hemiplegia and hemiparesis following cerebral infarction affecting right dominant side: Secondary | ICD-10-CM | POA: Diagnosis not present

## 2023-06-06 DIAGNOSIS — I69318 Other symptoms and signs involving cognitive functions following cerebral infarction: Secondary | ICD-10-CM | POA: Diagnosis not present

## 2023-06-06 DIAGNOSIS — I10 Essential (primary) hypertension: Secondary | ICD-10-CM | POA: Diagnosis not present

## 2023-06-06 DIAGNOSIS — F0153 Vascular dementia, unspecified severity, with mood disturbance: Secondary | ICD-10-CM | POA: Diagnosis not present

## 2023-06-07 DIAGNOSIS — I69391 Dysphagia following cerebral infarction: Secondary | ICD-10-CM | POA: Diagnosis not present

## 2023-06-07 DIAGNOSIS — F0153 Vascular dementia, unspecified severity, with mood disturbance: Secondary | ICD-10-CM | POA: Diagnosis not present

## 2023-06-07 DIAGNOSIS — I69351 Hemiplegia and hemiparesis following cerebral infarction affecting right dominant side: Secondary | ICD-10-CM | POA: Diagnosis not present

## 2023-06-07 DIAGNOSIS — I10 Essential (primary) hypertension: Secondary | ICD-10-CM | POA: Diagnosis not present

## 2023-06-07 DIAGNOSIS — I69320 Aphasia following cerebral infarction: Secondary | ICD-10-CM | POA: Diagnosis not present

## 2023-06-07 DIAGNOSIS — I69318 Other symptoms and signs involving cognitive functions following cerebral infarction: Secondary | ICD-10-CM | POA: Diagnosis not present

## 2023-06-08 DIAGNOSIS — F0153 Vascular dementia, unspecified severity, with mood disturbance: Secondary | ICD-10-CM | POA: Diagnosis not present

## 2023-06-08 DIAGNOSIS — I69391 Dysphagia following cerebral infarction: Secondary | ICD-10-CM | POA: Diagnosis not present

## 2023-06-08 DIAGNOSIS — I10 Essential (primary) hypertension: Secondary | ICD-10-CM | POA: Diagnosis not present

## 2023-06-08 DIAGNOSIS — I69351 Hemiplegia and hemiparesis following cerebral infarction affecting right dominant side: Secondary | ICD-10-CM | POA: Diagnosis not present

## 2023-06-08 DIAGNOSIS — I69320 Aphasia following cerebral infarction: Secondary | ICD-10-CM | POA: Diagnosis not present

## 2023-06-08 DIAGNOSIS — I69318 Other symptoms and signs involving cognitive functions following cerebral infarction: Secondary | ICD-10-CM | POA: Diagnosis not present

## 2023-06-09 DIAGNOSIS — I69318 Other symptoms and signs involving cognitive functions following cerebral infarction: Secondary | ICD-10-CM | POA: Diagnosis not present

## 2023-06-09 DIAGNOSIS — F0153 Vascular dementia, unspecified severity, with mood disturbance: Secondary | ICD-10-CM | POA: Diagnosis not present

## 2023-06-09 DIAGNOSIS — I69391 Dysphagia following cerebral infarction: Secondary | ICD-10-CM | POA: Diagnosis not present

## 2023-06-09 DIAGNOSIS — I10 Essential (primary) hypertension: Secondary | ICD-10-CM | POA: Diagnosis not present

## 2023-06-09 DIAGNOSIS — I69320 Aphasia following cerebral infarction: Secondary | ICD-10-CM | POA: Diagnosis not present

## 2023-06-09 DIAGNOSIS — I69351 Hemiplegia and hemiparesis following cerebral infarction affecting right dominant side: Secondary | ICD-10-CM | POA: Diagnosis not present

## 2023-06-10 DIAGNOSIS — I69351 Hemiplegia and hemiparesis following cerebral infarction affecting right dominant side: Secondary | ICD-10-CM | POA: Diagnosis not present

## 2023-06-10 DIAGNOSIS — I69391 Dysphagia following cerebral infarction: Secondary | ICD-10-CM | POA: Diagnosis not present

## 2023-06-10 DIAGNOSIS — F0153 Vascular dementia, unspecified severity, with mood disturbance: Secondary | ICD-10-CM | POA: Diagnosis not present

## 2023-06-10 DIAGNOSIS — I69318 Other symptoms and signs involving cognitive functions following cerebral infarction: Secondary | ICD-10-CM | POA: Diagnosis not present

## 2023-06-10 DIAGNOSIS — I10 Essential (primary) hypertension: Secondary | ICD-10-CM | POA: Diagnosis not present

## 2023-06-10 DIAGNOSIS — I69320 Aphasia following cerebral infarction: Secondary | ICD-10-CM | POA: Diagnosis not present

## 2023-06-11 DIAGNOSIS — F0153 Vascular dementia, unspecified severity, with mood disturbance: Secondary | ICD-10-CM | POA: Diagnosis not present

## 2023-06-11 DIAGNOSIS — I69318 Other symptoms and signs involving cognitive functions following cerebral infarction: Secondary | ICD-10-CM | POA: Diagnosis not present

## 2023-06-11 DIAGNOSIS — I69320 Aphasia following cerebral infarction: Secondary | ICD-10-CM | POA: Diagnosis not present

## 2023-06-11 DIAGNOSIS — I69351 Hemiplegia and hemiparesis following cerebral infarction affecting right dominant side: Secondary | ICD-10-CM | POA: Diagnosis not present

## 2023-06-11 DIAGNOSIS — I69391 Dysphagia following cerebral infarction: Secondary | ICD-10-CM | POA: Diagnosis not present

## 2023-06-11 DIAGNOSIS — I10 Essential (primary) hypertension: Secondary | ICD-10-CM | POA: Diagnosis not present

## 2023-06-12 DIAGNOSIS — I69351 Hemiplegia and hemiparesis following cerebral infarction affecting right dominant side: Secondary | ICD-10-CM | POA: Diagnosis not present

## 2023-06-12 DIAGNOSIS — I10 Essential (primary) hypertension: Secondary | ICD-10-CM | POA: Diagnosis not present

## 2023-06-12 DIAGNOSIS — I69318 Other symptoms and signs involving cognitive functions following cerebral infarction: Secondary | ICD-10-CM | POA: Diagnosis not present

## 2023-06-12 DIAGNOSIS — I69320 Aphasia following cerebral infarction: Secondary | ICD-10-CM | POA: Diagnosis not present

## 2023-06-12 DIAGNOSIS — I69391 Dysphagia following cerebral infarction: Secondary | ICD-10-CM | POA: Diagnosis not present

## 2023-06-12 DIAGNOSIS — F0153 Vascular dementia, unspecified severity, with mood disturbance: Secondary | ICD-10-CM | POA: Diagnosis not present

## 2023-06-16 DIAGNOSIS — I69391 Dysphagia following cerebral infarction: Secondary | ICD-10-CM | POA: Diagnosis not present

## 2023-06-16 DIAGNOSIS — I69318 Other symptoms and signs involving cognitive functions following cerebral infarction: Secondary | ICD-10-CM | POA: Diagnosis not present

## 2023-06-16 DIAGNOSIS — I10 Essential (primary) hypertension: Secondary | ICD-10-CM | POA: Diagnosis not present

## 2023-06-16 DIAGNOSIS — I69320 Aphasia following cerebral infarction: Secondary | ICD-10-CM | POA: Diagnosis not present

## 2023-06-16 DIAGNOSIS — I69351 Hemiplegia and hemiparesis following cerebral infarction affecting right dominant side: Secondary | ICD-10-CM | POA: Diagnosis not present

## 2023-06-16 DIAGNOSIS — F0153 Vascular dementia, unspecified severity, with mood disturbance: Secondary | ICD-10-CM | POA: Diagnosis not present

## 2023-06-17 DIAGNOSIS — K219 Gastro-esophageal reflux disease without esophagitis: Secondary | ICD-10-CM | POA: Diagnosis not present

## 2023-06-17 DIAGNOSIS — I69318 Other symptoms and signs involving cognitive functions following cerebral infarction: Secondary | ICD-10-CM | POA: Diagnosis not present

## 2023-06-17 DIAGNOSIS — L89312 Pressure ulcer of right buttock, stage 2: Secondary | ICD-10-CM | POA: Diagnosis not present

## 2023-06-17 DIAGNOSIS — I2510 Atherosclerotic heart disease of native coronary artery without angina pectoris: Secondary | ICD-10-CM | POA: Diagnosis not present

## 2023-06-17 DIAGNOSIS — E785 Hyperlipidemia, unspecified: Secondary | ICD-10-CM | POA: Diagnosis not present

## 2023-06-17 DIAGNOSIS — J449 Chronic obstructive pulmonary disease, unspecified: Secondary | ICD-10-CM | POA: Diagnosis not present

## 2023-06-17 DIAGNOSIS — I69320 Aphasia following cerebral infarction: Secondary | ICD-10-CM | POA: Diagnosis not present

## 2023-06-17 DIAGNOSIS — I69391 Dysphagia following cerebral infarction: Secondary | ICD-10-CM | POA: Diagnosis not present

## 2023-06-17 DIAGNOSIS — I69351 Hemiplegia and hemiparesis following cerebral infarction affecting right dominant side: Secondary | ICD-10-CM | POA: Diagnosis not present

## 2023-06-17 DIAGNOSIS — Z8616 Personal history of COVID-19: Secondary | ICD-10-CM | POA: Diagnosis not present

## 2023-06-17 DIAGNOSIS — F0153 Vascular dementia, unspecified severity, with mood disturbance: Secondary | ICD-10-CM | POA: Diagnosis not present

## 2023-06-17 DIAGNOSIS — I10 Essential (primary) hypertension: Secondary | ICD-10-CM | POA: Diagnosis not present

## 2023-06-18 DIAGNOSIS — I10 Essential (primary) hypertension: Secondary | ICD-10-CM | POA: Diagnosis not present

## 2023-06-18 DIAGNOSIS — I69391 Dysphagia following cerebral infarction: Secondary | ICD-10-CM | POA: Diagnosis not present

## 2023-06-18 DIAGNOSIS — I69351 Hemiplegia and hemiparesis following cerebral infarction affecting right dominant side: Secondary | ICD-10-CM | POA: Diagnosis not present

## 2023-06-18 DIAGNOSIS — I69318 Other symptoms and signs involving cognitive functions following cerebral infarction: Secondary | ICD-10-CM | POA: Diagnosis not present

## 2023-06-18 DIAGNOSIS — I69320 Aphasia following cerebral infarction: Secondary | ICD-10-CM | POA: Diagnosis not present

## 2023-06-18 DIAGNOSIS — F0153 Vascular dementia, unspecified severity, with mood disturbance: Secondary | ICD-10-CM | POA: Diagnosis not present

## 2023-06-19 DIAGNOSIS — I69391 Dysphagia following cerebral infarction: Secondary | ICD-10-CM | POA: Diagnosis not present

## 2023-06-19 DIAGNOSIS — I10 Essential (primary) hypertension: Secondary | ICD-10-CM | POA: Diagnosis not present

## 2023-06-19 DIAGNOSIS — I69318 Other symptoms and signs involving cognitive functions following cerebral infarction: Secondary | ICD-10-CM | POA: Diagnosis not present

## 2023-06-19 DIAGNOSIS — I69320 Aphasia following cerebral infarction: Secondary | ICD-10-CM | POA: Diagnosis not present

## 2023-06-19 DIAGNOSIS — I69351 Hemiplegia and hemiparesis following cerebral infarction affecting right dominant side: Secondary | ICD-10-CM | POA: Diagnosis not present

## 2023-06-19 DIAGNOSIS — F0153 Vascular dementia, unspecified severity, with mood disturbance: Secondary | ICD-10-CM | POA: Diagnosis not present

## 2023-06-20 DIAGNOSIS — I69320 Aphasia following cerebral infarction: Secondary | ICD-10-CM | POA: Diagnosis not present

## 2023-06-20 DIAGNOSIS — I10 Essential (primary) hypertension: Secondary | ICD-10-CM | POA: Diagnosis not present

## 2023-06-20 DIAGNOSIS — I69391 Dysphagia following cerebral infarction: Secondary | ICD-10-CM | POA: Diagnosis not present

## 2023-06-20 DIAGNOSIS — I69318 Other symptoms and signs involving cognitive functions following cerebral infarction: Secondary | ICD-10-CM | POA: Diagnosis not present

## 2023-06-20 DIAGNOSIS — F0153 Vascular dementia, unspecified severity, with mood disturbance: Secondary | ICD-10-CM | POA: Diagnosis not present

## 2023-06-20 DIAGNOSIS — I69351 Hemiplegia and hemiparesis following cerebral infarction affecting right dominant side: Secondary | ICD-10-CM | POA: Diagnosis not present

## 2023-06-21 DIAGNOSIS — I10 Essential (primary) hypertension: Secondary | ICD-10-CM | POA: Diagnosis not present

## 2023-06-21 DIAGNOSIS — F0153 Vascular dementia, unspecified severity, with mood disturbance: Secondary | ICD-10-CM | POA: Diagnosis not present

## 2023-06-21 DIAGNOSIS — I69391 Dysphagia following cerebral infarction: Secondary | ICD-10-CM | POA: Diagnosis not present

## 2023-06-21 DIAGNOSIS — I69320 Aphasia following cerebral infarction: Secondary | ICD-10-CM | POA: Diagnosis not present

## 2023-06-21 DIAGNOSIS — I69351 Hemiplegia and hemiparesis following cerebral infarction affecting right dominant side: Secondary | ICD-10-CM | POA: Diagnosis not present

## 2023-06-21 DIAGNOSIS — I69318 Other symptoms and signs involving cognitive functions following cerebral infarction: Secondary | ICD-10-CM | POA: Diagnosis not present

## 2023-06-22 DIAGNOSIS — I10 Essential (primary) hypertension: Secondary | ICD-10-CM | POA: Diagnosis not present

## 2023-06-22 DIAGNOSIS — I69391 Dysphagia following cerebral infarction: Secondary | ICD-10-CM | POA: Diagnosis not present

## 2023-06-22 DIAGNOSIS — I69351 Hemiplegia and hemiparesis following cerebral infarction affecting right dominant side: Secondary | ICD-10-CM | POA: Diagnosis not present

## 2023-06-22 DIAGNOSIS — I69320 Aphasia following cerebral infarction: Secondary | ICD-10-CM | POA: Diagnosis not present

## 2023-06-22 DIAGNOSIS — I69318 Other symptoms and signs involving cognitive functions following cerebral infarction: Secondary | ICD-10-CM | POA: Diagnosis not present

## 2023-06-22 DIAGNOSIS — F0153 Vascular dementia, unspecified severity, with mood disturbance: Secondary | ICD-10-CM | POA: Diagnosis not present

## 2023-06-23 DIAGNOSIS — F0153 Vascular dementia, unspecified severity, with mood disturbance: Secondary | ICD-10-CM | POA: Diagnosis not present

## 2023-06-23 DIAGNOSIS — I69318 Other symptoms and signs involving cognitive functions following cerebral infarction: Secondary | ICD-10-CM | POA: Diagnosis not present

## 2023-06-23 DIAGNOSIS — I69320 Aphasia following cerebral infarction: Secondary | ICD-10-CM | POA: Diagnosis not present

## 2023-06-23 DIAGNOSIS — I69351 Hemiplegia and hemiparesis following cerebral infarction affecting right dominant side: Secondary | ICD-10-CM | POA: Diagnosis not present

## 2023-06-23 DIAGNOSIS — I10 Essential (primary) hypertension: Secondary | ICD-10-CM | POA: Diagnosis not present

## 2023-06-23 DIAGNOSIS — I69391 Dysphagia following cerebral infarction: Secondary | ICD-10-CM | POA: Diagnosis not present

## 2023-07-15 DEATH — deceased
